# Patient Record
Sex: Female | Born: 1950 | Race: Black or African American | Hispanic: No | Smoking: Former smoker
Health system: Southern US, Community
[De-identification: ages and names within clinical notes are randomized; demographics above are authoritative.]

## PROBLEM LIST (undated history)

## (undated) DIAGNOSIS — E119 Type 2 diabetes mellitus without complications: Secondary | ICD-10-CM

## (undated) DIAGNOSIS — G20A1 Parkinson's disease without dyskinesia, without mention of fluctuations: Secondary | ICD-10-CM

## (undated) DIAGNOSIS — I10 Essential (primary) hypertension: Secondary | ICD-10-CM

## (undated) DIAGNOSIS — F1910 Other psychoactive substance abuse, uncomplicated: Secondary | ICD-10-CM

## (undated) HISTORY — PX: EYE SURGERY: SHX253

## (undated) HISTORY — DX: Other psychoactive substance abuse, uncomplicated: F19.10

## (undated) HISTORY — DX: Type 2 diabetes mellitus without complications: E11.9

---

## 1999-07-02 ENCOUNTER — Encounter: Admission: RE | Admit: 1999-07-02 | Discharge: 1999-07-02 | Payer: Self-pay | Admitting: Obstetrics and Gynecology

## 1999-07-02 ENCOUNTER — Encounter: Payer: Self-pay | Admitting: Obstetrics and Gynecology

## 1999-09-03 ENCOUNTER — Other Ambulatory Visit: Admission: RE | Admit: 1999-09-03 | Discharge: 1999-09-03 | Payer: Self-pay | Admitting: Obstetrics and Gynecology

## 2000-10-19 ENCOUNTER — Other Ambulatory Visit: Admission: RE | Admit: 2000-10-19 | Discharge: 2000-10-19 | Payer: Self-pay | Admitting: Obstetrics and Gynecology

## 2000-10-28 ENCOUNTER — Encounter: Admission: RE | Admit: 2000-10-28 | Discharge: 2000-10-28 | Payer: Self-pay | Admitting: Obstetrics and Gynecology

## 2000-10-28 ENCOUNTER — Encounter: Payer: Self-pay | Admitting: Obstetrics and Gynecology

## 2000-11-24 ENCOUNTER — Ambulatory Visit (HOSPITAL_COMMUNITY): Admission: RE | Admit: 2000-11-24 | Discharge: 2000-11-24 | Payer: Self-pay | Admitting: Gastroenterology

## 2003-10-04 ENCOUNTER — Inpatient Hospital Stay (HOSPITAL_COMMUNITY): Admission: EM | Admit: 2003-10-04 | Discharge: 2003-10-08 | Payer: Self-pay | Admitting: Emergency Medicine

## 2004-07-08 ENCOUNTER — Encounter: Admission: RE | Admit: 2004-07-08 | Discharge: 2004-07-08 | Payer: Self-pay | Admitting: Internal Medicine

## 2004-09-29 ENCOUNTER — Encounter: Admission: RE | Admit: 2004-09-29 | Discharge: 2004-09-29 | Payer: Self-pay | Admitting: Internal Medicine

## 2005-02-19 ENCOUNTER — Encounter: Admission: RE | Admit: 2005-02-19 | Discharge: 2005-02-19 | Payer: Self-pay | Admitting: Orthopedic Surgery

## 2005-03-04 ENCOUNTER — Encounter: Admission: RE | Admit: 2005-03-04 | Discharge: 2005-03-04 | Payer: Self-pay | Admitting: Orthopedic Surgery

## 2005-08-17 ENCOUNTER — Encounter: Admission: RE | Admit: 2005-08-17 | Discharge: 2005-08-17 | Payer: Self-pay | Admitting: Internal Medicine

## 2006-08-19 ENCOUNTER — Encounter: Admission: RE | Admit: 2006-08-19 | Discharge: 2006-08-19 | Payer: Self-pay | Admitting: Internal Medicine

## 2007-05-02 ENCOUNTER — Emergency Department (HOSPITAL_COMMUNITY): Admission: EM | Admit: 2007-05-02 | Discharge: 2007-05-03 | Payer: Self-pay | Admitting: Emergency Medicine

## 2007-09-11 ENCOUNTER — Encounter: Admission: RE | Admit: 2007-09-11 | Discharge: 2007-09-11 | Payer: Self-pay | Admitting: Internal Medicine

## 2007-09-20 ENCOUNTER — Emergency Department (HOSPITAL_COMMUNITY): Admission: EM | Admit: 2007-09-20 | Discharge: 2007-09-20 | Payer: Self-pay | Admitting: Family Medicine

## 2007-09-25 ENCOUNTER — Emergency Department (HOSPITAL_COMMUNITY): Admission: EM | Admit: 2007-09-25 | Discharge: 2007-09-25 | Payer: Self-pay | Admitting: Emergency Medicine

## 2007-09-26 ENCOUNTER — Ambulatory Visit (HOSPITAL_COMMUNITY): Admission: RE | Admit: 2007-09-26 | Discharge: 2007-09-26 | Payer: Self-pay | Admitting: Emergency Medicine

## 2007-09-26 ENCOUNTER — Encounter (INDEPENDENT_AMBULATORY_CARE_PROVIDER_SITE_OTHER): Payer: Self-pay | Admitting: Emergency Medicine

## 2007-09-26 ENCOUNTER — Ambulatory Visit: Payer: Self-pay | Admitting: Surgery

## 2008-09-20 ENCOUNTER — Encounter: Admission: RE | Admit: 2008-09-20 | Discharge: 2008-09-20 | Payer: Self-pay | Admitting: Internal Medicine

## 2009-01-14 ENCOUNTER — Emergency Department (HOSPITAL_COMMUNITY): Admission: EM | Admit: 2009-01-14 | Discharge: 2009-01-14 | Payer: Self-pay | Admitting: Family Medicine

## 2009-01-16 ENCOUNTER — Observation Stay (HOSPITAL_COMMUNITY): Admission: EM | Admit: 2009-01-16 | Discharge: 2009-01-18 | Payer: Self-pay | Admitting: Emergency Medicine

## 2009-09-02 ENCOUNTER — Ambulatory Visit (HOSPITAL_COMMUNITY): Admission: RE | Admit: 2009-09-02 | Discharge: 2009-09-02 | Payer: Self-pay | Admitting: Internal Medicine

## 2010-07-09 LAB — HEMOGLOBIN A1C
Hgb A1c MFr Bld: 7.1 % — ABNORMAL HIGH (ref 4.6–6.1)
Mean Plasma Glucose: 157 mg/dL

## 2010-07-09 LAB — CBC
HCT: 29.1 % — ABNORMAL LOW (ref 36.0–46.0)
HCT: 32.2 % — ABNORMAL LOW (ref 36.0–46.0)
Hemoglobin: 9.9 g/dL — ABNORMAL LOW (ref 12.0–15.0)
Hemoglobin: 10.9 g/dL — ABNORMAL LOW (ref 12.0–15.0)
MCHC: 33.8 g/dL (ref 30.0–36.0)
MCV: 85.6 fL (ref 78.0–100.0)
MCV: 85.7 fL (ref 78.0–100.0)
Platelets: 284 10*3/uL (ref 150–400)
Platelets: 285 K/uL (ref 150–400)
RBC: 3.4 MIL/uL — ABNORMAL LOW (ref 3.87–5.11)
RBC: 3.76 MIL/uL — ABNORMAL LOW (ref 3.87–5.11)
RDW: 16.2 % — ABNORMAL HIGH (ref 11.5–15.5)
RDW: 16.5 % — ABNORMAL HIGH (ref 11.5–15.5)
WBC: 11 10*3/uL — ABNORMAL HIGH (ref 4.0–10.5)

## 2010-07-09 LAB — BASIC METABOLIC PANEL
BUN: 32 mg/dL — ABNORMAL HIGH (ref 6–23)
Calcium: 9.4 mg/dL (ref 8.4–10.5)
Chloride: 104 mEq/L (ref 96–112)
Creatinine, Ser: 0.99 mg/dL (ref 0.4–1.2)
Creatinine, Ser: 1.64 mg/dL — ABNORMAL HIGH (ref 0.4–1.2)
GFR calc non Af Amer: 58 mL/min — ABNORMAL LOW (ref >60)
GFR calc non Af Amer: 32 mL/min — ABNORMAL LOW (ref >60)
Glucose, Bld: 140 mg/dL — ABNORMAL HIGH (ref 70–99)
Potassium: 4.5 mEq/L (ref 3.5–5.1)
Sodium: 135 mEq/L (ref 135–145)
Sodium: 137 mEq/L (ref 135–145)

## 2010-07-09 LAB — URINALYSIS, ROUTINE W REFLEX MICROSCOPIC
Bilirubin Urine: NEGATIVE
Glucose, UA: NEGATIVE mg/dL
Hgb urine dipstick: NEGATIVE
Ketones, ur: NEGATIVE mg/dL
Nitrite: NEGATIVE
Protein, ur: NEGATIVE mg/dL
Specific Gravity, Urine: 1.014 (ref 1.005–1.030)
Urobilinogen, UA: 0.2 mg/dL (ref 0.0–1.0)
pH: 5.5 (ref 5.0–8.0)

## 2010-07-09 LAB — URINE MICROSCOPIC-ADD ON

## 2010-07-09 LAB — COMPREHENSIVE METABOLIC PANEL
AST: 12 U/L (ref 0–37)
BUN: 20 mg/dL (ref 6–23)
CO2: 24 mEq/L (ref 19–32)
Calcium: 9.1 mg/dL (ref 8.4–10.5)
Chloride: 109 mEq/L (ref 96–112)
Creatinine, Ser: 1.16 mg/dL (ref 0.4–1.2)
GFR calc Af Amer: 58 mL/min — ABNORMAL LOW (ref >60)
GFR calc non Af Amer: 48 mL/min — ABNORMAL LOW (ref >60)
Total Bilirubin: 0.2 mg/dL — ABNORMAL LOW (ref 0.3–1.2)

## 2010-07-09 LAB — LIPID PANEL
Cholesterol: 123 mg/dL (ref 0–200)
LDL Cholesterol: 51 mg/dL (ref 0–99)

## 2010-07-09 LAB — GLUCOSE, CAPILLARY
Glucose-Capillary: 108 mg/dL — ABNORMAL HIGH (ref 70–99)
Glucose-Capillary: 192 mg/dL — ABNORMAL HIGH (ref 70–99)
Glucose-Capillary: 244 mg/dL — ABNORMAL HIGH (ref 70–99)
Glucose-Capillary: 83 mg/dL (ref 70–99)

## 2010-07-09 LAB — DIFFERENTIAL
Basophils Absolute: 0 10*3/uL (ref 0.0–0.1)
Basophils Relative: 0 % (ref 0–1)
Eosinophils Absolute: 0.1 K/uL (ref 0.0–0.7)
Eosinophils Relative: 1 % (ref 0–5)
Lymphocytes Relative: 14 % (ref 12–46)
Lymphs Abs: 1.5 K/uL (ref 0.7–4.0)
Monocytes Absolute: 1 10*3/uL (ref 0.1–1.0)
Monocytes Relative: 9 % (ref 3–12)
Neutro Abs: 8.4 10*3/uL — ABNORMAL HIGH (ref 1.7–7.7)
Neutrophils Relative %: 77 % (ref 43–77)

## 2010-07-09 LAB — BASIC METABOLIC PANEL WITH GFR
CO2: 24 meq/L (ref 19–32)
GFR calc Af Amer: 39 mL/min — ABNORMAL LOW (ref >60)
Potassium: 4 meq/L (ref 3.5–5.1)

## 2010-07-09 LAB — CULTURE, BLOOD (ROUTINE X 2)
Culture: NO GROWTH
Culture: NO GROWTH

## 2010-07-11 ENCOUNTER — Inpatient Hospital Stay (INDEPENDENT_AMBULATORY_CARE_PROVIDER_SITE_OTHER)
Admission: RE | Admit: 2010-07-11 | Discharge: 2010-07-11 | Disposition: A | Discharge status: Home / Self Care | Payer: Self-pay | Source: Ambulatory Visit | Attending: Emergency Medicine | Admitting: Emergency Medicine

## 2010-07-11 DIAGNOSIS — R55 Syncope and collapse: Secondary | ICD-10-CM

## 2010-07-11 DIAGNOSIS — R112 Nausea with vomiting, unspecified: Secondary | ICD-10-CM

## 2010-07-11 LAB — POCT URINALYSIS DIP (DEVICE)
Bilirubin Urine: NEGATIVE
Glucose, UA: NEGATIVE mg/dL
Ketones, ur: NEGATIVE mg/dL
Nitrite: NEGATIVE
pH: 6.5 (ref 5.0–8.0)

## 2010-08-21 NOTE — Procedures (Signed)
[place]  Patient:    April Larson, April Larson Visit Number: [id number] MRN: [id number]          Service Type: END Location: ENDO Attending Physician:  Orland Mustard Proc. Date: 11/24/00 Adm. Date:  11/24/2000   CC:         Janae Bridgeman. Eloise Harman., M.D.   Procedure Report  PROCEDURE:  Colonoscopy.  MEDICATIONS:  Fentanyl 60 mcg, Versed 6 mg IV.  INDICATION:  Previous history of adenomatous colon polyps.  SCOPE:  Adult Olympus video colonoscope.  DESCRIPTION OF PROCEDURE:  The procedure had been explained to the patient and consent obtained.  With the patient in the left lateral decubitus position, the Olympus adult video colonoscope was inserted and advanced under direct visualization.  The prep was excellent, and we were able to reach the cecum using abdominal pressure and position changes.  The cecum was reached, ileocecal valve was seen, and the appendiceal orifice was identified.  The scope was withdrawn, and the cecum, ascending colon, hepatic flexure, transverse colon, splenic flexure, descending, and sigmoid colon were seen well upon removal.  No polyps were seen throughout.  There was no significant diverticular disease.  The scope was withdrawn.  The patient tolerated the procedure well, was maintained on low-flow oxygen and pulse oximeter throughout the procedure.  ASSESSMENT:  No evidence of further colonic polyps.  PLAN:  Will plan on repeating the procedure in five years. Attending Physician:  Orland Mustard DD:  11/24/00 TD:  11/25/00 Job: [id number]

## 2010-08-21 NOTE — Discharge Summary (Signed)
NAME:  April Larson, April Larson                       ACCOUNT NO.:  [id number]   MEDICAL RECORD NO.:  [id number]                   PATIENT TYPE:  INP   LOCATION:  6703                                 FACILITY:  [facility]   PHYSICIAN:  Robyn N. Allyne Gee, M.D.              DATE OF BIRTH:  January 17, 1951   DATE OF ADMISSION:  10/04/2003  DATE OF DISCHARGE:  10/08/2003                                 DISCHARGE SUMMARY   DISCHARGE DIAGNOSES:  1. Cellulitis.  2. Hypertension.  3. Diabetes.  4. Hypercholesterolemia.   Ms. [name] is a 60 year old female with a past medical history significant  for type 2 diabetes, hypertension and hyperlipidemia who was admitted for  treatment of left ankle and leg pain and swelling.  She states that she went  to bed the night prior to her admission with leg swelling, however, awakened  at about 11:30 p.m. with left leg swelling and red splotches.  She was found  to have a temperature of 100 at home, however, it had risen to 103.0 in the  ER.  Neither antibiotics nor Tylenol were initially given to the patient,  however, upon examination it was determined that the patient needed to be  admitted for IV antibiotics for treatment of cellulitis.   Her hospitalization for the cellulitis was rather uneventful.  She was  started on IV Unasyn and blood cultures were checked which both came back  negative or without growth.  A left lower extremity Doppler was performed to  rule out lower extremity DVT which was negative.  Her initial white count  was 21,000 and gradually decreased over the next several days to 16.9 and  then 10.8.  The patient complained about left bone pain that occurred while  placing her foot on the floor.  Left lower extremity x-ray was done to rule  out osteomyelitis which was negative.  She was placed on Lovenox daily for  DVT prophylaxis.  The patient's pain was resolved with a K pad and oral pain  medication.  She was then discharged in stable condition  on October 08, 2003.  She was discharged home on Augmentin XR 1000 mg p.o. b.i.d. and Ultracet one  tablet p.o. q.6h. p.r.n. pain.  The patient __________ narcotics for pain  because of her history of narcotic abuse.  The importance of strict blood  glucose control was stressed to the patient to help her infection heal a lot  quicker.   She was to follow up with Dr. Allyne Gee in one week and an appointment was  scheduled for her to see a podiatrist as an outpatient.  She was advised to  call the office if she experienced increased pain or swelling.   Again, she was discharged on Augmentin and Ultracet as stated above plus all  of her home medications which includes:  1. Lipitor 10 mg p.o. q.h.s.  2. Amaryl 4 mg p.o. daily.  3. Hyzaar 50/12.5  mg one tablet daily.  4. Avandia 2 mg p.o. b.i.d.  5. Metformin 500 mg p.o. b.i.d.   Her symptoms were stable and improved.                                                Robyn N. Allyne Gee, M.D.    RNS/MEDQ  D:  11/25/2003  T:  11/26/2003  Job:  [id number]

## 2010-12-31 LAB — POCT I-STAT, CHEM 8
BUN: 17
Creatinine, Ser: 0.8
Hemoglobin: 12.6
Potassium: 3.9
Sodium: 138
TCO2: 28

## 2010-12-31 LAB — DIFFERENTIAL
Basophils Absolute: 0
Eosinophils Absolute: 0.2
Eosinophils Relative: 3
Lymphocytes Relative: 26
Lymphs Abs: 2.4
Neutrophils Relative %: 65

## 2010-12-31 LAB — INFLUENZA A AND B ANTIGEN (CONVERTED LAB): Influenza B Ag: NEGATIVE

## 2010-12-31 LAB — CBC
HCT: 34.6 — ABNORMAL LOW
Platelets: 485 — ABNORMAL HIGH
RDW: 15.5
WBC: 9.2

## 2012-07-13 ENCOUNTER — Other Ambulatory Visit (HOSPITAL_COMMUNITY): Payer: Self-pay | Admitting: Internal Medicine

## 2012-07-13 DIAGNOSIS — Z1231 Encounter for screening mammogram for malignant neoplasm of breast: Secondary | ICD-10-CM

## 2012-07-14 ENCOUNTER — Ambulatory Visit (HOSPITAL_COMMUNITY)
Admission: RE | Admit: 2012-07-14 | Discharge: 2012-07-14 | Disposition: A | Discharge status: Home / Self Care | Payer: BC Managed Care – PPO | Source: Ambulatory Visit | Attending: Internal Medicine | Admitting: Internal Medicine

## 2012-07-14 DIAGNOSIS — Z1231 Encounter for screening mammogram for malignant neoplasm of breast: Secondary | ICD-10-CM | POA: Insufficient documentation

## 2012-08-04 ENCOUNTER — Ambulatory Visit (INDEPENDENT_AMBULATORY_CARE_PROVIDER_SITE_OTHER): Payer: BC Managed Care – PPO | Admitting: Emergency Medicine

## 2012-08-04 VITALS — BP 126/74 | HR 96 | Temp 98.0°F | Resp 18 | Ht 64.0 in | Wt 171.6 lb

## 2012-08-04 DIAGNOSIS — IMO0002 Reserved for concepts with insufficient information to code with codable children: Secondary | ICD-10-CM

## 2012-08-04 DIAGNOSIS — L02512 Cutaneous abscess of left hand: Secondary | ICD-10-CM

## 2012-08-04 MED ORDER — SULFAMETHOXAZOLE-TRIMETHOPRIM 800-160 MG PO TABS: 1.0000 | ORAL_TABLET | Freq: Two times a day (BID) | ORAL | Status: DC

## 2012-08-04 NOTE — Progress Notes (Signed)
[facility] [address] [place] [address] [phone]  Date:  08/04/2012   Name:  April Larson   DOB:  August 01, 1950   MRN:  [id number]  PCP:  [name], MD    Chief Complaint: Joint Swelling   History of Present Illness:  April Larson is a 62 y.o. very pleasant female patient who presents with the following:  Tuesday morning noticed a swelling in the tip of her left third finger.  Now has large pustule under tip of nail swelling the tuft.  No lymphadenitis.  No other complaints.  No fever or chills or history of injury  There are no active problems to display for this patient.   Past Medical History  Diagnosis Date  . Substance abuse   . Diabetes mellitus without complication     History reviewed. No pertinent past surgical history.  History  Substance Use Topics  . Smoking status: Never Smoker   . Smokeless tobacco: Not on file  . Alcohol Use: No    Family History  Problem Relation Age of Onset  . Cancer Mother   . Diabetes Father   . Cancer Brother     No Known Allergies  Medication list has been reviewed and updated.  No current outpatient prescriptions on file prior to visit.   No current facility-administered medications on file prior to visit.    Review of Systems:  As per HPI, otherwise negative.    Physical Examination: Filed Vitals:   08/04/12 1420  BP: 126/74  Pulse: 96  Temp: 98 F (36.7 C)  Resp: 18   Filed Vitals:   08/04/12 1420  Height: 5\' 4"  (1.626 m)  Weight: 171 lb 9.6 oz (77.837 kg)   Body mass index is 29.44 kg/(m^2). Ideal Body Weight: Weight in (lb) to have BMI = 25: 145.3   GEN: WDWN, NAD, Non-toxic, Alert & Oriented x 3 HEENT: Atraumatic, Normocephalic.  Ears and Nose: No external deformity. EXTR: No clubbing/cyanosis/edema NEURO: Normal gait.  PSYCH: Normally interactive. Conversant. Not depressed or anxious appearing.  Calm demeanor.  LEFT HAND:   Felon third finger  Assessment and Plan: FELON Needle aspiration Septra Warm soaks Expect to lose the nail   Signed,  Phillips Odor, MD

## 2012-08-04 NOTE — Patient Instructions (Addendum)
Paronychia  Paronychia is an inflammatory reaction involving the folds of the skin surrounding the fingernail. This is commonly caused by an infection in the skin around a nail. The most common cause of paronychia is frequent wetting of the hands (as seen with bartenders, food servers, nurses or others who wet their hands). This makes the skin around the fingernail susceptible to infection by bacteria (germs) or fungus. Other predisposing factors are:   Aggressive manicuring.   Nail biting.   Thumb sucking.  The most common cause is a staphylococcal (a type of germ) infection, or a fungal (Candida) infection. When caused by a germ, it usually comes on suddenly with redness, swelling, pus and is often painful. It may get under the nail and form an abscess (collection of pus), or form an abscess around the nail. If the nail itself is infected with a fungus, the treatment is usually prolonged and may require oral medicine for up to one year. Your caregiver will determine the length of time treatment is required. The paronychia caused by bacteria (germs) may largely be avoided by not pulling on hangnails or picking at cuticles. When the infection occurs at the tips of the finger it is called felon. When the cause of paronychia is from the herpes simplex virus (HSV) it is called herpetic whitlow.  TREATMENT   When an abscess is present treatment is often incision and drainage. This means that the abscess must be cut open so the pus can get out. When this is done, the following home care instructions should be followed.  HOME CARE INSTRUCTIONS    It is important to keep the affected fingers very dry. Rubber or plastic gloves over cotton gloves should be used whenever the hand must be placed in water.   Keep wound clean, dry and dressed as suggested by your caregiver between warm soaks or warm compresses.   Soak in warm water for fifteen to twenty minutes three to four times per day for bacterial infections. Fungal  infections are very difficult to treat, so often require treatment for long periods of time.   For bacterial (germ) infections take antibiotics (medicine which kill germs) as directed and finish the prescription, even if the problem appears to be solved before the medicine is gone.   Only take over-the-counter or prescription medicines for pain, discomfort, or fever as directed by your caregiver.  SEEK IMMEDIATE MEDICAL CARE IF:   You have redness, swelling, or increasing pain in the wound.   You notice pus coming from the wound.   You have a fever.   You notice a bad smell coming from the wound or dressing.  Document Released: 09/15/2000 Document Revised: 06/14/2011 Document Reviewed: 05/17/2008  ExitCare Patient Information 2013 [employer].

## 2013-06-28 ENCOUNTER — Other Ambulatory Visit (HOSPITAL_COMMUNITY): Payer: Self-pay | Admitting: Internal Medicine

## 2013-06-28 DIAGNOSIS — Z1231 Encounter for screening mammogram for malignant neoplasm of breast: Secondary | ICD-10-CM

## 2013-07-16 ENCOUNTER — Ambulatory Visit (HOSPITAL_COMMUNITY)
Admission: RE | Admit: 2013-07-16 | Discharge: 2013-07-16 | Disposition: A | Discharge status: Home / Self Care | Payer: No Typology Code available for payment source | Source: Ambulatory Visit | Attending: Internal Medicine | Admitting: Internal Medicine

## 2013-07-16 DIAGNOSIS — Z1231 Encounter for screening mammogram for malignant neoplasm of breast: Secondary | ICD-10-CM | POA: Insufficient documentation

## 2014-06-26 ENCOUNTER — Ambulatory Visit (INDEPENDENT_AMBULATORY_CARE_PROVIDER_SITE_OTHER): Payer: 59 | Admitting: Emergency Medicine

## 2014-06-26 ENCOUNTER — Ambulatory Visit (INDEPENDENT_AMBULATORY_CARE_PROVIDER_SITE_OTHER): Payer: 59

## 2014-06-26 VITALS — BP 132/74 | HR 80 | Temp 97.9°F | Resp 20 | Ht 64.0 in | Wt 184.5 lb

## 2014-06-26 DIAGNOSIS — M25571 Pain in right ankle and joints of right foot: Secondary | ICD-10-CM | POA: Diagnosis not present

## 2014-06-26 DIAGNOSIS — S8261XA Displaced fracture of lateral malleolus of right fibula, initial encounter for closed fracture: Secondary | ICD-10-CM | POA: Diagnosis not present

## 2014-06-26 MED ORDER — HYDROCODONE-ACETAMINOPHEN 5-325 MG PO TABS: 1.0000 | ORAL_TABLET | ORAL | Status: DC | PRN

## 2014-06-26 NOTE — Patient Instructions (Signed)
Ankle Fracture  A fracture is a break in a bone. The ankle joint is made up of three bones. These include the lower (distal)sections of your lower leg bones, called the tibia and fibula, along with a bone in your foot, called the talus. Depending on how bad the break is and if more than one ankle joint bone is broken, a cast or splint is used to protect and keep your injured bone from moving while it heals. Sometimes, surgery is required to help the fracture heal properly.   There are two general types of fractures:   Stable fracture. This includes a single fracture line through one bone, with no injury to ankle ligaments. A fracture of the talus that does not have any displacement (movement of the bone on either side of the fracture line) is also stable.   Unstable fracture. This includes more than one fracture line through one or more bones in the ankle joint. It also includes fractures that have displacement of the bone on either side of the fracture line.  CAUSES   A direct blow to the ankle.    Quickly and severely twisting your ankle.   Trauma, such as a car accident or falling from a significant height.  RISK FACTORS  You may be at a higher risk of ankle fracture if:   You have certain medical conditions.   You are involved in high-impact sports.   You are involved in a high-impact car accident.  SIGNS AND SYMPTOMS    Tender and swollen ankle.   Bruising around the injured ankle.   Pain on movement of the ankle.   Difficulty walking or putting weight on the ankle.   A cold foot below the site of the ankle injury. This can occur if the blood vessels passing through your injured ankle were also damaged.   Numbness in the foot below the site of the ankle injury.  DIAGNOSIS   An ankle fracture is usually diagnosed with a physical exam and X-rays. A CT scan may also be required for complex fractures.  TREATMENT   Stable fractures are treated with a cast or splint and using crutches to avoid putting  weight on your injured ankle. This is followed by an ankle strengthening program. Some patients require a special type of cast, depending on other medical problems they may have. Unstable fractures require surgery to ensure the bones heal properly. Your health care provider will tell you what type of fracture you have and the best treatment for your condition.  HOME CARE INSTRUCTIONS    Review correct crutch use with your health care provider and use your crutches as directed. Safe use of crutches is extremely important. Misuse of crutches can cause you to fall or cause injury to nerves in your hands or armpits.   Do not put weight or pressure on the injured ankle until directed by your health care provider.   To lessen the swelling, keep the injured leg elevated while sitting or lying down.   Apply ice to the injured area:   Put ice in a plastic bag.   Place a towel between your cast and the bag.   Leave the ice on for 20 minutes, 2-3 times a day.   If you have a plaster or fiberglass cast:   Do not try to scratch the skin under the cast with any objects. This can increase your risk of skin infection.   Check the skin around the cast every day. You   may put lotion on any red or sore areas.   Keep your cast dry and clean.   If you have a plaster splint:   Wear the splint as directed.   You may loosen the elastic around the splint if your toes become numb, tingle, or turn cold or blue.   Do not put pressure on any part of your cast or splint; it may break. Rest your cast only on a pillow the first 24 hours until it is fully hardened.   Your cast or splint can be protected during bathing with a plastic bag sealed to your skin with medical tape. Do not lower the cast or splint into water.   Take medicines as directed by your health care provider. Only take over-the-counter or prescription medicines for pain, discomfort, or fever as directed by your health care provider.   Do not drive a vehicle until  your health care provider specifically tells you it is safe to do so.   If your health care provider has given you a follow-up appointment, it is very important to keep that appointment. Not keeping the appointment could result in a chronic or permanent injury, pain, and disability. If you have any problem keeping the appointment, call the facility for assistance.  SEEK MEDICAL CARE IF:  You develop increased swelling or discomfort.  SEEK IMMEDIATE MEDICAL CARE IF:    Your cast gets damaged or breaks.   You have continued severe pain.   You develop new pain or swelling after the cast was put on.   Your skin or toenails below the injury turn blue or gray.   Your skin or toenails below the injury feel cold, numb, or have loss of sensitivity to touch.   There is a bad smell or pus draining from under the cast.  MAKE SURE YOU:    Understand these instructions.   Will watch your condition.   Will get help right away if you are not doing well or get worse.  Document Released: 03/19/2000 Document Revised: 03/27/2013 Document Reviewed: 10/19/2012  ExitCare Patient Information 2015 [employer]. This information is not intended to replace advice given to you by your health care provider. Make sure you discuss any questions you have with your health care provider.

## 2014-06-26 NOTE — Progress Notes (Signed)
[facility] [address] [place] [address] [phone]  Date:  06/26/2014   Name:  April Larson   DOB:  1950-07-17   MRN:  [id number]  PCP:  Maximino Greenland, MD    Chief Complaint: Ankle Injury   History of Present Illness:  April Larson is a 64 y.o. very pleasant female patient who presents with the following:  Missed bottom step and fell, twisting right ankle No deformity Tender and painful right lateral malleolus. Pain with ambulation.  Less with elevation Denies other injury. No improvement with over the counter medications or other home remedies.  Denies other complaint or health concern today.   There are no active problems to display for this patient.   Past Medical History  Diagnosis Date  . Substance abuse   . Diabetes mellitus without complication     History reviewed. No pertinent past surgical history.  History  Substance Use Topics  . Smoking status: Never Smoker   . Smokeless tobacco: Not on file  . Alcohol Use: No    Family History  Problem Relation Age of Onset  . Cancer Mother   . Diabetes Father   . Cancer Brother     No Known Allergies  Medication list has been reviewed and updated.  Current Outpatient Prescriptions on File Prior to Visit  Medication Sig Dispense Refill  . cholecalciferol (VITAMIN D) 400 UNITS TABS Take by mouth.    . insulin detemir (LEVEMIR) 100 UNIT/ML injection Inject 45 Units into the skin at bedtime.     . Liraglutide (VICTOZA) 18 MG/3ML SOPN Inject 1.8 Units into the skin.     Marland Kitchen loratadine (CLARITIN) 10 MG tablet Take 10 mg by mouth daily.    . metFORMIN (GLUCOPHAGE) 500 MG tablet Take 500 mg by mouth 2 (two) times daily with a meal.      No current facility-administered medications on file prior to visit.    Review of Systems:  As per HPI, otherwise negative.    Physical Examination: Filed Vitals:   06/26/14 1910  BP: 132/74  Pulse: 80   Temp: 97.9 F (36.6 C)  Resp: 20   Filed Vitals:   06/26/14 1910  Height: 5\' 4"  (1.626 m)  Weight: 184 lb 8 oz (83.689 kg)   Body mass index is 31.65 kg/(m^2). Ideal Body Weight: Weight in (lb) to have BMI = 25: 145.3   GEN: WDWN, NAD, Non-toxic, Alert & Oriented x 3 HEENT: Atraumatic, Normocephalic.  Ears and Nose: No external deformity. EXTR: No clubbing/cyanosis/edema NEURO: Normal gait.  PSYCH: Normally interactive. Conversant. Not depressed or anxious appearing.  Calm demeanor.  Tender lateral malleolus and lateral ankle.  No deformity.  Tender dorsal right foot.  Some swelling and guards   Assessment and Plan: Fracture distal fibula Boot Ortho  Signed,  Ellison Carwin, MD   [facility] reading (PRIMARY) by  Dr. Ouida Sills.  Flake fracture distal fibula.

## 2014-06-27 ENCOUNTER — Telehealth: Payer: Self-pay

## 2014-06-27 NOTE — Telephone Encounter (Signed)
What is a cb

## 2014-06-27 NOTE — Telephone Encounter (Signed)
She was seen here last night on 3/23 and received a boot. She would like a CB asap, so she can get a handicap sticker for her car before 5 today. I told her this was probably not going to happen today. Please advise at [phone]

## 2014-06-27 NOTE — Telephone Encounter (Signed)
Dr. Ouida Sills, please advise. Thanks

## 2014-06-29 NOTE — Telephone Encounter (Signed)
Callback

## 2014-07-23 ENCOUNTER — Other Ambulatory Visit (HOSPITAL_COMMUNITY): Payer: Self-pay | Admitting: Internal Medicine

## 2014-07-23 DIAGNOSIS — Z1231 Encounter for screening mammogram for malignant neoplasm of breast: Secondary | ICD-10-CM

## 2014-07-24 ENCOUNTER — Ambulatory Visit (HOSPITAL_COMMUNITY)
Admission: RE | Admit: 2014-07-24 | Discharge: 2014-07-24 | Disposition: A | Discharge status: Home / Self Care | Payer: 59 | Source: Ambulatory Visit | Attending: Internal Medicine | Admitting: Internal Medicine

## 2014-07-24 DIAGNOSIS — Z1231 Encounter for screening mammogram for malignant neoplasm of breast: Secondary | ICD-10-CM

## 2015-07-07 ENCOUNTER — Other Ambulatory Visit: Payer: Self-pay

## 2015-07-07 DIAGNOSIS — Z1231 Encounter for screening mammogram for malignant neoplasm of breast: Secondary | ICD-10-CM

## 2015-07-25 ENCOUNTER — Ambulatory Visit: Payer: No Typology Code available for payment source

## 2015-08-19 ENCOUNTER — Ambulatory Visit
Admission: RE | Admit: 2015-08-19 | Discharge: 2015-08-19 | Disposition: A | Discharge status: Home / Self Care | Payer: BLUE CROSS/BLUE SHIELD | Source: Ambulatory Visit

## 2015-08-19 DIAGNOSIS — Z1231 Encounter for screening mammogram for malignant neoplasm of breast: Secondary | ICD-10-CM

## 2015-09-18 ENCOUNTER — Encounter (HOSPITAL_COMMUNITY): Payer: Self-pay | Admitting: Emergency Medicine

## 2015-09-18 ENCOUNTER — Emergency Department (HOSPITAL_COMMUNITY)
Admission: EM | Admit: 2015-09-18 | Discharge: 2015-09-18 | Disposition: A | Discharge status: Home / Self Care | Payer: BLUE CROSS/BLUE SHIELD | Attending: Emergency Medicine | Admitting: Emergency Medicine

## 2015-09-18 DIAGNOSIS — Y929 Unspecified place or not applicable: Secondary | ICD-10-CM | POA: Diagnosis not present

## 2015-09-18 DIAGNOSIS — S0181XA Laceration without foreign body of other part of head, initial encounter: Secondary | ICD-10-CM | POA: Diagnosis present

## 2015-09-18 DIAGNOSIS — Y999 Unspecified external cause status: Secondary | ICD-10-CM | POA: Diagnosis not present

## 2015-09-18 DIAGNOSIS — Y939 Activity, unspecified: Secondary | ICD-10-CM | POA: Diagnosis not present

## 2015-09-18 DIAGNOSIS — S0191XA Laceration without foreign body of unspecified part of head, initial encounter: Secondary | ICD-10-CM

## 2015-09-18 DIAGNOSIS — Z7984 Long term (current) use of oral hypoglycemic drugs: Secondary | ICD-10-CM | POA: Diagnosis not present

## 2015-09-18 DIAGNOSIS — W2209XA Striking against other stationary object, initial encounter: Secondary | ICD-10-CM | POA: Diagnosis not present

## 2015-09-18 DIAGNOSIS — E119 Type 2 diabetes mellitus without complications: Secondary | ICD-10-CM | POA: Insufficient documentation

## 2015-09-18 DIAGNOSIS — Z794 Long term (current) use of insulin: Secondary | ICD-10-CM | POA: Diagnosis not present

## 2015-09-18 MED ORDER — LIDOCAINE HCL (PF) 1 % IJ SOLN
5.0000 mL | Freq: Once | INTRAMUSCULAR | Status: AC
  Administered 2015-09-18: 5 mL
  Filled 2015-09-18: qty 5

## 2015-09-18 MED ORDER — TETANUS-DIPHTH-ACELL PERTUSSIS 5-2.5-18.5 LF-MCG/0.5 IM SUSP
0.5000 mL | Freq: Once | INTRAMUSCULAR | Status: AC
  Administered 2015-09-18: 0.5 mL via INTRAMUSCULAR
  Filled 2015-09-18: qty 0.5

## 2015-09-18 NOTE — ED Notes (Signed)
Pt stable, ambulatory, states understanding of discharge instructions 

## 2015-09-18 NOTE — Discharge Instructions (Signed)
Sutured Wound Care Sutures are stitches that can be used to close wounds. Taking care of your wound properly can help prevent pain and infection. It can also help your wound to heal more quickly. HOW TO CARE FOR YOUR SUTURED WOUND Wound Care  Keep the wound clean and dry.  If you were given a bandage (dressing), change it at least one time per day or as told by your doctor. You should also change it if it gets wet or dirty.  Keep the wound completely dry for the first 24 hours or as told by your doctor. After that time, you may shower or bathe. However, make sure that the wound is not soaked in water until the sutures have been removed.  Clean the wound one time each day or as told by your doctor.  Wash the wound with soap and water.  Rinse the wound with water to remove all soap.  Pat the wound dry with a clean towel. Do not rub the wound.  After cleaning the wound, put a thin layer of antibiotic ointment on it as told your doctor. This ointment:  Helps to prevent infection.  Keeps the bandage from sticking to the wound.  Have the sutures removed as told by your doctor. General Instructions  Take or apply medicines only as told by your doctor.  To help prevent scarring, make sure to cover your wound with sunscreen whenever you are outside after the sutures are removed and the wound is healed. Make sure to wear a sunscreen of at least 30 SPF.  If you were prescribed an antibiotic medicine or ointment, finish all of it even if you start to feel better.  Do not scratch or pick at the wound.  Keep all follow-up visits as told by your doctor. This is important.  Check your wound every day for signs of infection. Watch for:  Redness, swelling, or pain.  Fluid, blood, or pus.  Raise (elevate) the injured area above the level of your heart while you are sitting or lying down, if possible.  Avoid stretching your wound.  Drink enough fluids to keep your pee (urine) clear or  pale yellow. GET HELP IF:  You were given a tetanus shot and you have any of these where the needle went in:  Swelling.  Very bad pain.  Redness.  Bleeding.  You have a fever.  A wound that was closed breaks open.  You notice a bad smell coming from the wound.  You notice something coming out of the wound, such as wood or glass.  Medicine does not help your pain.  You have any of these at the site of the wound.  More redness.  More swelling.  More pain.  You have any of these coming from the wound.  Fluid.  Blood.  Pus.  You notice a change in the color of your skin near the wound.  You need to change the bandage often due to fluid, blood, or pus coming from the wound.  You have a new rash.  You have numbness around the wound. GET HELP RIGHT AWAY IF:  You have very bad swelling around the wound.  Your pain suddenly gets worse and is very bad.  You have painful lumps near the wound or on skin that is anywhere on your body.  You have a red streak going away from the wound.  The wound is on your hand or foot and you cannot move a finger or toe like normal.  The wound is on your hand or foot and you notice that your fingers or toes look pale or bluish.   This information is not intended to replace advice given to you by your health care provider. Make sure you discuss any questions you have with your health care provider.   Follow-up with your primary care doctor in 5 days for suture removal. Keep wound clean and dry. You may wash with antibacterial soap and water. Return to the emergency department if you experience redness or swelling around your wound, fevers, chills, loss of consciousness, blurry vision, vomiting.

## 2015-09-18 NOTE — ED Provider Notes (Signed)
CSN: [id number]     Arrival date & time 09/18/15  1613 History  By signing my name below, I, April Larson, attest that this documentation has been prepared under the direction and in the presence of non-physician practitioner, Donnald Garre, PA-C. Electronically Signed: Higinio Larson, Scribe. 09/18/2015. 5:56 PM.   Chief Complaint  Patient presents with  . Head Laceration   The history is provided by the patient. No language interpreter was used.   HPI Comments:  April Larson is a 65 y.o. female with PMHx of DM (sugar: 130-206), who presents to the Emergency Department complaining of gradually worsening, "throbbing," 8/10, pain from left-sided forehead laceration that occurred at ~2:30pm this afternoon. Pt states she accidentally walked into the garage railing as she was preparing to get into her car. She denies loss of consciousness or loss of memory at the time of injury. She states she cleaned the wound and applied neosporin; however, she began to feel oozing blood down her face. She notes her wound is no longer bleeding at this time. She states she called her PCP who advised her to come to the ED. She denies dizziness, vomiting, and blurry vision. She also denies the use of blood thinners and any recent EtOH consumption. She reports her last tetanus immunization was in 2006.   Past Medical History  Diagnosis Date  . Substance abuse   . Diabetes mellitus without complication ([place])    History reviewed. No pertinent past surgical history. Family History  Problem Relation Age of Onset  . Cancer Mother   . Diabetes Father   . Cancer Brother    Social History  Substance Use Topics  . Smoking status: Never Smoker   . Smokeless tobacco: None  . Alcohol Use: No   OB History    No data available     Review of Systems 10 systems reviewed and all are negative for acute change except as noted in the HPI.  Allergies  Review of patient's allergies indicates no known  allergies.  Home Medications   Prior to Admission medications   Medication Sig Start Date End Date Taking? Authorizing Provider  Biotin 10 MG CAPS Take 1 tablet by mouth daily.    Historical Provider, MD  cholecalciferol (VITAMIN D) 400 UNITS TABS Take by mouth.    Historical Provider, MD  HYDROcodone-acetaminophen (NORCO) 5-325 MG per tablet Take 1-2 tablets by mouth every 4 (four) hours as needed. 06/26/14   Roselee Culver, MD  insulin detemir (LEVEMIR) 100 UNIT/ML injection Inject 45 Units into the skin at bedtime.     Historical Provider, MD  Liraglutide (VICTOZA) 18 MG/3ML SOPN Inject 1.8 Units into the skin.     Historical Provider, MD  loratadine (CLARITIN) 10 MG tablet Take 10 mg by mouth daily.    Historical Provider, MD  losartan-hydrochlorothiazide (HYZAAR) 50-12.5 MG per tablet Take 1 tablet by mouth daily.    Historical Provider, MD  metFORMIN (GLUCOPHAGE) 500 MG tablet Take 500 mg by mouth 2 (two) times daily with a meal.     Historical Provider, MD  pioglitazone (ACTOS) 15 MG tablet Take 15 mg by mouth daily.    Historical Provider, MD  Pitavastatin Calcium 4 MG TABS Take 1 tablet by mouth daily.    Historical Provider, MD   Triage Vitals: BP 145/78 mmHg  Pulse 91  Temp(Src) 99.2 F (37.3 C) (Oral)  Resp 18  Ht 5\' 4"  (1.626 m)  Wt 178 lb (80.74 kg)  BMI 30.54  kg/m2  SpO2 100% Physical Exam  Constitutional: She is oriented to person, place, and time. She appears well-developed and well-nourished. No distress.  HENT:  Head: Normocephalic.  2cm laceration to left forehead  No foreign bodies seen or palpated No sign of depressed skull fracture No battle sign or racoon eyes  No hemotympanum   Eyes: Conjunctivae are normal. Right eye exhibits no discharge. Left eye exhibits no discharge. No scleral icterus.  Cardiovascular: Normal rate.   Pulmonary/Chest: Effort normal.  Neurological: She is alert and oriented to person, place, and time. Coordination normal.  Skin:  Skin is warm and dry. No rash noted. She is not diaphoretic. No erythema. No pallor.  Psychiatric: She has a normal mood and affect. Her behavior is normal.  Nursing note and vitals reviewed.   ED Course  Procedures  DIAGNOSTIC STUDIES:  Oxygen Saturation is 100% on RA, normal by my interpretation.    COORDINATION OF CARE:  5:26 PM Discussed treatment Larson with pt at bedside and pt agreed to Larson.  LACERATION REPAIR PROCEDURE NOTE The patient's identification was confirmed and consent was obtained. This procedure was performed by No att. providers found at 6:59 PM. Site: left forehead Sterile procedures observed: yes Anesthetic used (type and amt): 1% lidocaine w/out epi Suture type/size:5-0 ethilon Length:2 cm # of Sutures: 3 Technique:simple interrupted Complexity: simple Antibx ointment applied: yes Tetanus UTD or ordered: yes Site anesthetized, irrigated with NS, explored without evidence of foreign body, wound well approximated, site covered with dry, sterile dressing.  Patient tolerated procedure well without complications. Instructions for care discussed verbally and patient provided with additional written instructions for homecare and f/u.  Labs Review Labs Reviewed - No data to display  Imaging Review No results found. I have personally reviewed and evaluated these images and lab results as part of my medical decision-making.   EKG Interpretation None      MDM   Final diagnoses:  Laceration of head, initial encounter   Tetanus updated in ED. Laceration occurred < 12 hours prior to repair. Pt appears well in ED. GCS 15. No CT needed using [place] CT head guidelines. Discussed laceration care with pt and answered questions. Pt to f-u for suture removal in 5 days and wound check sooner should there be signs of dehiscence or infection. Pt is hemodynamically stable with no complaints prior to dc.    I personally performed the services described in this  documentation, which was scribed in my presence. The recorded information has been reviewed and is accurate.    April Larson [place], PA-C 09/18/15 2033  Julianne Rice, MD 09/18/15 [phone]

## 2015-09-18 NOTE — ED Notes (Signed)
Pt st's she walked into a piece of metal on garage door,  Pt has lac to left forehead,  No bleeding at this time.  Pt denies LOC

## 2016-04-05 ENCOUNTER — Encounter (HOSPITAL_COMMUNITY): Payer: Self-pay | Admitting: Emergency Medicine

## 2016-04-05 ENCOUNTER — Emergency Department (HOSPITAL_COMMUNITY): Payer: BLUE CROSS/BLUE SHIELD

## 2016-04-05 ENCOUNTER — Emergency Department (HOSPITAL_COMMUNITY)
Admission: EM | Admit: 2016-04-05 | Discharge: 2016-04-05 | Disposition: A | Discharge status: Home / Self Care | Payer: BLUE CROSS/BLUE SHIELD | Attending: Emergency Medicine | Admitting: Emergency Medicine

## 2016-04-05 DIAGNOSIS — S93601A Unspecified sprain of right foot, initial encounter: Secondary | ICD-10-CM

## 2016-04-05 DIAGNOSIS — Y9389 Activity, other specified: Secondary | ICD-10-CM | POA: Insufficient documentation

## 2016-04-05 DIAGNOSIS — Y999 Unspecified external cause status: Secondary | ICD-10-CM | POA: Insufficient documentation

## 2016-04-05 DIAGNOSIS — Z794 Long term (current) use of insulin: Secondary | ICD-10-CM | POA: Insufficient documentation

## 2016-04-05 DIAGNOSIS — Y929 Unspecified place or not applicable: Secondary | ICD-10-CM | POA: Insufficient documentation

## 2016-04-05 DIAGNOSIS — E119 Type 2 diabetes mellitus without complications: Secondary | ICD-10-CM | POA: Insufficient documentation

## 2016-04-05 DIAGNOSIS — X509XXA Other and unspecified overexertion or strenuous movements or postures, initial encounter: Secondary | ICD-10-CM | POA: Insufficient documentation

## 2016-04-05 DIAGNOSIS — S99921A Unspecified injury of right foot, initial encounter: Secondary | ICD-10-CM | POA: Diagnosis present

## 2016-04-05 MED ORDER — IBUPROFEN 600 MG PO TABS: 600.0000 mg | ORAL_TABLET | Freq: Four times a day (QID) | ORAL | 0 refills | Status: DC | PRN

## 2016-04-05 MED ORDER — HYDROCODONE-ACETAMINOPHEN 5-325 MG PO TABS: 1.0000 | ORAL_TABLET | ORAL | 0 refills | Status: DC | PRN

## 2016-04-05 NOTE — ED Triage Notes (Signed)
Pt reports she tripped up some steps causing injury to right foot. Foot has some swelling with pain to touch.

## 2016-04-05 NOTE — ED Provider Notes (Addendum)
[facility] DEPT Provider Note   CSN: [id number] Arrival date & time: 04/05/16  [id number]     History   Chief Complaint Chief Complaint  Patient presents with  . Foot Injury    HPI April Larson is a 66 y.o. female.  Pt was at a [employer] party last night.  She was going down some steps and missed a step and twisted her right foot.  Initially, she was able to walk, but she could not this morning.  She took tylenol pta and does not want anything else for pain.  She sustained no other injuries in the fall.      Past Medical History:  Diagnosis Date  . Diabetes mellitus without complication ([place])   . Substance abuse     There are no active problems to display for this patient.   No past surgical history on file.  OB History    No data available       Home Medications    Prior to Admission medications   Medication Sig Start Date End Date Taking? Authorizing Provider  Biotin 10 MG CAPS Take 1 tablet by mouth daily.    Historical Provider, MD  cholecalciferol (VITAMIN D) 400 UNITS TABS Take by mouth.    Historical Provider, MD  ibuprofen (ADVIL,MOTRIN) 600 MG tablet Take 1 tablet (600 mg total) by mouth every 6 (six) hours as needed. 04/05/16   Isla Pence, MD  insulin detemir (LEVEMIR) 100 UNIT/ML injection Inject 45 Units into the skin at bedtime.     Historical Provider, MD  Liraglutide (VICTOZA) 18 MG/3ML SOPN Inject 1.8 Units into the skin.     Historical Provider, MD  loratadine (CLARITIN) 10 MG tablet Take 10 mg by mouth daily.    Historical Provider, MD  losartan-hydrochlorothiazide (HYZAAR) 50-12.5 MG per tablet Take 1 tablet by mouth daily.    Historical Provider, MD  metFORMIN (GLUCOPHAGE) 500 MG tablet Take 500 mg by mouth 2 (two) times daily with a meal.     Historical Provider, MD  pioglitazone (ACTOS) 15 MG tablet Take 15 mg by mouth daily.    Historical Provider, MD  Pitavastatin Calcium 4 MG TABS Take 1 tablet by mouth daily.     Historical Provider, MD    Family History Family History  Problem Relation Age of Onset  . Cancer Mother   . Diabetes Father   . Cancer Brother     Social History Social History  Substance Use Topics  . Smoking status: Never Smoker  . Smokeless tobacco: Not on file  . Alcohol use No     Allergies   Patient has no known allergies.   Review of Systems Review of Systems  Musculoskeletal:       Right foot pain/swelling  All other systems reviewed and are negative.    Physical Exam Updated Vital Signs BP 133/78   Pulse 81   Temp 98.7 F (37.1 C) (Oral)   Resp 18   SpO2 99%   Physical Exam  Constitutional: She is oriented to person, place, and time. She appears well-developed and well-nourished.  HENT:  Head: Normocephalic and atraumatic.  Right Ear: External ear normal.  Left Ear: External ear normal.  Nose: Nose normal.  Mouth/Throat: Oropharynx is clear and moist.  Eyes: Conjunctivae and EOM are normal. Pupils are equal, round, and reactive to light.  Neck: Normal range of motion. Neck supple.  Cardiovascular: Normal rate, regular rhythm, normal heart sounds and intact distal pulses.  Pulmonary/Chest: Effort normal and breath sounds normal.  Abdominal: Soft. Bowel sounds are normal.  Musculoskeletal:       Feet:  Neurological: She is alert and oriented to person, place, and time.  Skin: Skin is warm and dry.  Psychiatric: She has a normal mood and affect. Her behavior is normal. Judgment and thought content normal.  Nursing note and vitals reviewed.    ED Treatments / Results  Labs (all labs ordered are listed, but only abnormal results are displayed) Labs Reviewed - No data to display  EKG  EKG Interpretation None       Radiology Dg Foot Complete Right  Result Date: 04/05/2016 CLINICAL DATA:  Pt reports she missed a step outside her house around midnight last night and hit her right foot against the concrete; pt has a bruise on the top  of her foot near the head of the 4th metatarsal; pt reports she was unable to move her toes this AM when she woke; pt is diabetic; no prior history of injury to the foot but she has injured her right ankle; no surgery EXAM: RIGHT FOOT COMPLETE - 3+ VIEW COMPARISON:  None. FINDINGS: There is no evidence of fracture or dislocation. There is no evidence of arthropathy or other focal bone abnormality. Soft tissues are unremarkable. IMPRESSION: Negative. Electronically Signed   By: Lajean Manes M.D.   On: 04/05/2016 10:56    Procedures Procedures (including critical care time)  Medications Ordered in ED Medications - No data to display   Initial Impression / Assessment and Plan / ED Course  I have reviewed the triage vital signs and the nursing notes.  Pertinent labs & imaging results that were available during my care of the patient were reviewed by me and considered in my medical decision making (see chart for details).  Clinical Course    Pt will be given a fracture shoe and crutches.  She is given the number for ortho.  She knows to return if worse.  Pt initially given a rx for lortab, but pt did not want and it was shredded.  She has been a recovering addict for 25 years and did not want the temptation.  Final Clinical Impressions(s) / ED Diagnoses   Final diagnoses:  Sprain of right foot, initial encounter    New Prescriptions New Prescriptions   IBUPROFEN (ADVIL,MOTRIN) 600 MG TABLET    Take 1 tablet (600 mg total) by mouth every 6 (six) hours as needed.     Isla Pence, MD 04/05/16 1106    Isla Pence, MD 04/05/16 [phone]

## 2016-04-05 NOTE — ED Notes (Signed)
Teach back demonstration crutches and post op shoe.

## 2016-04-27 ENCOUNTER — Encounter (HOSPITAL_COMMUNITY): Payer: Self-pay | Admitting: *Deleted

## 2016-04-27 ENCOUNTER — Ambulatory Visit (INDEPENDENT_AMBULATORY_CARE_PROVIDER_SITE_OTHER): Payer: BLUE CROSS/BLUE SHIELD

## 2016-04-27 ENCOUNTER — Ambulatory Visit (HOSPITAL_COMMUNITY)
Admission: EM | Admit: 2016-04-27 | Discharge: 2016-04-27 | Disposition: A | Discharge status: Home / Self Care | Payer: BLUE CROSS/BLUE SHIELD | Attending: Family Medicine | Admitting: Family Medicine

## 2016-04-27 DIAGNOSIS — S62306A Unspecified fracture of fifth metacarpal bone, right hand, initial encounter for closed fracture: Secondary | ICD-10-CM | POA: Diagnosis not present

## 2016-04-27 NOTE — ED Provider Notes (Signed)
[facility]    CSN: [id number] Arrival date & time: 04/27/16  1231     History   Chief Complaint Chief Complaint  Patient presents with  . Hand Injury    HPI April Larson is a 66 y.o. female.   The history is provided by the patient.  Hand Injury  Location:  Hand Hand location:  L hand Injury: yes   Time since incident:  1 day Mechanism of injury: fall   Pain details:    Severity:  Mild   Progression:  Worsening Dislocation: no   Prior injury to area:  No   Past Medical History:  Diagnosis Date  . Diabetes mellitus without complication ([place])   . Substance abuse     There are no active problems to display for this patient.   History reviewed. No pertinent surgical history.  OB History    No data available       Home Medications    Prior to Admission medications   Medication Sig Start Date End Date Taking? Authorizing Provider  Biotin 10 MG CAPS Take 1 tablet by mouth daily.   Yes Historical Provider, MD  cholecalciferol (VITAMIN D) 400 UNITS TABS Take by mouth.   Yes Historical Provider, MD  insulin detemir (LEVEMIR) 100 UNIT/ML injection Inject 45 Units into the skin at bedtime.    Yes Historical Provider, MD  Liraglutide (VICTOZA) 18 MG/3ML SOPN Inject 1.8 Units into the skin.    Yes Historical Provider, MD  loratadine (CLARITIN) 10 MG tablet Take 10 mg by mouth daily.   Yes Historical Provider, MD  losartan-hydrochlorothiazide (HYZAAR) 50-12.5 MG per tablet Take 1 tablet by mouth daily.   Yes Historical Provider, MD  metFORMIN (GLUCOPHAGE) 500 MG tablet Take 500 mg by mouth 2 (two) times daily with a meal.    Yes Historical Provider, MD  pioglitazone (ACTOS) 15 MG tablet Take 15 mg by mouth daily.   Yes Historical Provider, MD  Pitavastatin Calcium 4 MG TABS Take 1 tablet by mouth daily.   Yes Historical Provider, MD  ibuprofen (ADVIL,MOTRIN) 600 MG tablet Take 1 tablet (600 mg total) by mouth every 6 (six) hours as needed.  04/05/16   Isla Pence, MD    Family History Family History  Problem Relation Age of Onset  . Cancer Mother   . Diabetes Father   . Cancer Brother     Social History Social History  Substance Use Topics  . Smoking status: Never Smoker  . Smokeless tobacco: Never Used  . Alcohol use No     Allergies   Patient has no known allergies.   Review of Systems Review of Systems  Musculoskeletal: Positive for joint swelling.  Skin: Positive for color change.  All other systems reviewed and are negative.    Physical Exam Triage Vital Signs ED Triage Vitals  Enc Vitals Group     BP 04/27/16 1351 157/99     Pulse Rate 04/27/16 1351 78     Resp 04/27/16 1351 16     Temp 04/27/16 1351 98.4 F (36.9 C)     Temp Source 04/27/16 1351 Oral     SpO2 04/27/16 1351 100 %     Weight --      Height --      Head Circumference --      Peak Flow --      Pain Score 04/27/16 1353 8     Pain Loc --      Pain  Edu? --      Excl. in [place]? --    No data found.   Updated Vital Signs BP 157/99 (BP Location: Right Arm)   Pulse 78   Temp 98.4 F (36.9 C) (Oral)   Resp 16   SpO2 100%   Visual Acuity Right Eye Distance:   Left Eye Distance:   Bilateral Distance:    Right Eye Near:   Left Eye Near:    Bilateral Near:     Physical Exam  Constitutional: She is oriented to person, place, and time. She appears well-developed and well-nourished.  Musculoskeletal: She exhibits tenderness and deformity.  Neurological: She is alert and oriented to person, place, and time.  Skin:  Left palmar ecchymosis and sts .  Nursing note and vitals reviewed.    UC Treatments / Results  Labs (all labs ordered are listed, but only abnormal results are displayed) Labs Reviewed - No data to display  EKG  EKG Interpretation None       Radiology Dg Hand Complete Left  Result Date: 04/27/2016 CLINICAL DATA:  Fall last night, significant soft tissue swelling EXAM: LEFT HAND - COMPLETE 3+  VIEW COMPARISON:  None. FINDINGS: Three views of the left hand submitted. There is impacted fracture distal aspect of fifth metacarpal. Question subtle nondisplaced fracture of the base of fifth metacarpal. Mild displaced fracture distal aspect of fourth metacarpal. There is significant soft tissue swelling metacarpal region and dorsal hand. IMPRESSION: There is impacted fracture distal aspect of fifth metacarpal. Question subtle nondisplaced fracture of the base of fifth metacarpal. Mild displaced fracture distal aspect of fourth metacarpal. There is significant soft tissue swelling metacarpal region and dorsal hand. Electronically Signed   By: Lahoma Crocker M.D.   On: 04/27/2016 14:14    Procedures Procedures (including critical care time)  Medications Ordered in UC Medications - No data to display   Initial Impression / Assessment and Plan / UC Course  I have reviewed the triage vital signs and the nursing notes.  Pertinent labs & imaging results that were available during my care of the patient were reviewed by me and considered in my medical decision making (see chart for details).     Discussed with dr Amedeo Plenty and plans as advised.  Final Clinical Impressions(s) / UC Diagnoses   Final diagnoses:  None    New Prescriptions New Prescriptions   No medications on file     Billy Fischer, MD 04/27/16 1558

## 2016-04-27 NOTE — Discharge Instructions (Signed)
See dr Amedeo Plenty at noon on wed.

## 2016-04-27 NOTE — ED Triage Notes (Signed)
Patient reports mechanical fall last night and used left hand to brace herself, moderate amount of swelling noted to left hand and bruising to anterior aspect of hand. Reports distally fingers feel "funny"

## 2016-04-27 NOTE — Progress Notes (Signed)
Orthopedic Tech Progress Note Patient Details:  April Larson 01-21-1951 [id number]  Ortho Devices Type of Ortho Device: Ace wrap, Volar splint Ortho Device/Splint Location: LUE Ortho Device/Splint Interventions: Ordered, Application   [name] 04/27/2016, 4:24 PM

## 2016-04-27 NOTE — ED Notes (Signed)
Patient took 600mg  of ibuprofen

## 2016-04-30 ENCOUNTER — Encounter (HOSPITAL_COMMUNITY): Payer: Self-pay | Admitting: Emergency Medicine

## 2016-04-30 ENCOUNTER — Ambulatory Visit (HOSPITAL_COMMUNITY)
Admission: EM | Admit: 2016-04-30 | Discharge: 2016-04-30 | Disposition: A | Discharge status: Home / Self Care | Payer: BLUE CROSS/BLUE SHIELD | Attending: Family Medicine | Admitting: Family Medicine

## 2016-04-30 DIAGNOSIS — R0789 Other chest pain: Secondary | ICD-10-CM | POA: Diagnosis not present

## 2016-04-30 MED ORDER — DICLOFENAC SODIUM 75 MG PO TBEC: 75.0000 mg | DELAYED_RELEASE_TABLET | Freq: Two times a day (BID) | ORAL | 0 refills | Status: DC

## 2016-04-30 MED ORDER — METHOCARBAMOL 500 MG PO TABS: 500.0000 mg | ORAL_TABLET | Freq: Two times a day (BID) | ORAL | 0 refills | Status: DC

## 2016-04-30 NOTE — Discharge Instructions (Signed)
I am treating you for musculoskeletal pain. I have sent two prescriptions to your pharmacy. Diclofenac, take 1 tablet twice a day, and Robaxin, take one tablet twice a day. Robaxin may cause drowsiness so do not drink alcohol or drive while taking. Should your pain fail to resolve or worsen, follow up with your primary care provider or return to clinic as needed.

## 2016-04-30 NOTE — ED Provider Notes (Signed)
CSN: [id number]     Arrival date & time 04/30/16  1052 History   First MD Initiated Contact with Patient 04/30/16 1225     Chief Complaint  Patient presents with  . Chest Pain   (Consider location/radiation/quality/duration/timing/severity/associated sxs/prior Treatment) 66 year old female presents to clinic with chief complaint of left sided rib pain. She fell on 04/27/16 and was evaluated in clinic for metacarpale fracture. She has since had her hand and wrist casted. She reports she has had rib pain that has been getting worse, worse when she lays on it, and worse with deep breath. She denies cough, denies shortness of breath.   The history is provided by the patient.  Chest Pain    Past Medical History:  Diagnosis Date  . Diabetes mellitus without complication ([place])   . Substance abuse    History reviewed. No pertinent surgical history. Family History  Problem Relation Age of Onset  . Cancer Mother   . Diabetes Father   . Cancer Brother    Social History  Substance Use Topics  . Smoking status: Never Smoker  . Smokeless tobacco: Never Used  . Alcohol use No   OB History    No data available     Review of Systems  Reason unable to perform ROS: as covered in HPI.  Cardiovascular: Positive for chest pain.  All other systems reviewed and are negative.   Allergies  Patient has no known allergies.  Home Medications   Prior to Admission medications   Medication Sig Start Date End Date Taking? Authorizing Provider  Biotin 10 MG CAPS Take 1 tablet by mouth daily.   Yes Historical Provider, MD  cholecalciferol (VITAMIN D) 400 UNITS TABS Take by mouth.   Yes Historical Provider, MD  ibuprofen (ADVIL,MOTRIN) 600 MG tablet Take 1 tablet (600 mg total) by mouth every 6 (six) hours as needed. 04/05/16  Yes Isla Pence, MD  insulin detemir (LEVEMIR) 100 UNIT/ML injection Inject 45 Units into the skin at bedtime.    Yes Historical Provider, MD  Liraglutide (VICTOZA) 18 MG/3ML  SOPN Inject 1.8 Units into the skin.    Yes Historical Provider, MD  loratadine (CLARITIN) 10 MG tablet Take 10 mg by mouth daily.   Yes Historical Provider, MD  losartan-hydrochlorothiazide (HYZAAR) 50-12.5 MG per tablet Take 1 tablet by mouth daily.   Yes Historical Provider, MD  metFORMIN (GLUCOPHAGE) 500 MG tablet Take 500 mg by mouth 2 (two) times daily with a meal.    Yes Historical Provider, MD  pioglitazone (ACTOS) 15 MG tablet Take 15 mg by mouth daily.   Yes Historical Provider, MD  Pitavastatin Calcium 4 MG TABS Take 1 tablet by mouth daily.   Yes Historical Provider, MD  diclofenac (VOLTAREN) 75 MG EC tablet Take 1 tablet (75 mg total) by mouth 2 (two) times daily. 04/30/16   Barnet Glasgow, NP  methocarbamol (ROBAXIN) 500 MG tablet Take 1 tablet (500 mg total) by mouth 2 (two) times daily. 04/30/16   Barnet Glasgow, NP   Meds Ordered and Administered this Visit  Medications - No data to display  BP 155/75 (BP Location: Right Arm)   Pulse 85   Temp 98.1 F (36.7 C) (Oral)   Resp 16   SpO2 100%  No data found.   Physical Exam  Constitutional: She is oriented to person, place, and time. She appears well-developed and well-nourished. No distress.  Cardiovascular: Normal rate and regular rhythm.   Pulmonary/Chest: Effort normal and breath sounds  normal. No respiratory distress. She has no decreased breath sounds. She has no wheezes. She has no rhonchi. She has no rales. She exhibits tenderness and bony tenderness. She exhibits no mass, no crepitus, no edema, no deformity and no retraction.    Abdominal: Soft. Bowel sounds are normal.  Neurological: She is alert and oriented to person, place, and time.  Skin: Skin is warm and dry. Capillary refill takes less than 2 seconds. She is not diaphoretic.  Psychiatric: She has a normal mood and affect.  Nursing note and vitals reviewed.   Urgent Care Course     Procedures (including critical care time)  Labs Review Labs  Reviewed - No data to display  Imaging Review No results found.   Visual Acuity Review  Right Eye Distance:   Left Eye Distance:   Bilateral Distance:    Right Eye Near:   Left Eye Near:    Bilateral Near:         MDM   1. Chest wall pain   I am treating you for musculoskeletal pain. I have sent two prescriptions to your pharmacy. Diclofenac, take 1 tablet twice a day, and Robaxin, take one tablet twice a day. Robaxin may cause drowsiness so do not drink alcohol or drive while taking. Should your pain fail to resolve or worsen, follow up with your primary care provider or return to clinic as needed.       Barnet Glasgow, NP 04/30/16 [place], NP 04/30/16 [phone]

## 2016-04-30 NOTE — ED Triage Notes (Signed)
Pt c/o left sided rib pain onset 3 days.... Reports she was seen here on 04/27/16 for fx of 5th metacarpal left hand due to fall  Pt states she started feeling pain on left side of rib so she came in to make sure nothing else is broken.   Pain increases w/activity  A&O x4... NAD

## 2016-08-10 ENCOUNTER — Other Ambulatory Visit: Payer: Self-pay | Admitting: Internal Medicine

## 2016-08-10 DIAGNOSIS — Z1231 Encounter for screening mammogram for malignant neoplasm of breast: Secondary | ICD-10-CM

## 2016-08-26 ENCOUNTER — Ambulatory Visit
Admission: RE | Admit: 2016-08-26 | Discharge: 2016-08-26 | Disposition: A | Discharge status: Home / Self Care | Payer: BLUE CROSS/BLUE SHIELD | Source: Ambulatory Visit | Attending: Internal Medicine | Admitting: Internal Medicine

## 2016-08-26 DIAGNOSIS — Z1231 Encounter for screening mammogram for malignant neoplasm of breast: Secondary | ICD-10-CM

## 2016-09-10 ENCOUNTER — Other Ambulatory Visit: Payer: Self-pay | Admitting: Internal Medicine

## 2016-09-10 DIAGNOSIS — R1011 Right upper quadrant pain: Secondary | ICD-10-CM

## 2016-09-13 ENCOUNTER — Other Ambulatory Visit: Payer: BLUE CROSS/BLUE SHIELD

## 2016-09-20 ENCOUNTER — Other Ambulatory Visit: Payer: BLUE CROSS/BLUE SHIELD

## 2016-09-20 ENCOUNTER — Ambulatory Visit
Admission: RE | Admit: 2016-09-20 | Discharge: 2016-09-20 | Disposition: A | Discharge status: Home / Self Care | Payer: BLUE CROSS/BLUE SHIELD | Source: Ambulatory Visit | Attending: Internal Medicine | Admitting: Internal Medicine

## 2016-09-20 DIAGNOSIS — R1011 Right upper quadrant pain: Secondary | ICD-10-CM

## 2016-12-08 DIAGNOSIS — E1165 Type 2 diabetes mellitus with hyperglycemia: Secondary | ICD-10-CM | POA: Diagnosis not present

## 2016-12-08 DIAGNOSIS — I10 Essential (primary) hypertension: Secondary | ICD-10-CM | POA: Diagnosis not present

## 2016-12-08 DIAGNOSIS — J309 Allergic rhinitis, unspecified: Secondary | ICD-10-CM | POA: Diagnosis not present

## 2017-01-04 DIAGNOSIS — Z23 Encounter for immunization: Secondary | ICD-10-CM | POA: Diagnosis not present

## 2017-02-02 DIAGNOSIS — J320 Chronic maxillary sinusitis: Secondary | ICD-10-CM | POA: Diagnosis not present

## 2017-03-03 DIAGNOSIS — Z23 Encounter for immunization: Secondary | ICD-10-CM | POA: Diagnosis not present

## 2017-03-03 DIAGNOSIS — I10 Essential (primary) hypertension: Secondary | ICD-10-CM | POA: Diagnosis not present

## 2017-03-03 DIAGNOSIS — E1165 Type 2 diabetes mellitus with hyperglycemia: Secondary | ICD-10-CM | POA: Diagnosis not present

## 2017-05-03 DIAGNOSIS — E1351 Other specified diabetes mellitus with diabetic peripheral angiopathy without gangrene: Secondary | ICD-10-CM | POA: Diagnosis not present

## 2017-05-03 DIAGNOSIS — S9032XA Contusion of left foot, initial encounter: Secondary | ICD-10-CM | POA: Diagnosis not present

## 2017-05-03 DIAGNOSIS — M79672 Pain in left foot: Secondary | ICD-10-CM | POA: Diagnosis not present

## 2017-06-09 DIAGNOSIS — N08 Glomerular disorders in diseases classified elsewhere: Secondary | ICD-10-CM | POA: Diagnosis not present

## 2017-06-09 DIAGNOSIS — N182 Chronic kidney disease, stage 2 (mild): Secondary | ICD-10-CM | POA: Diagnosis not present

## 2017-06-09 DIAGNOSIS — E1122 Type 2 diabetes mellitus with diabetic chronic kidney disease: Secondary | ICD-10-CM | POA: Diagnosis not present

## 2017-06-09 DIAGNOSIS — E559 Vitamin D deficiency, unspecified: Secondary | ICD-10-CM | POA: Diagnosis not present

## 2017-06-09 DIAGNOSIS — I129 Hypertensive chronic kidney disease with stage 1 through stage 4 chronic kidney disease, or unspecified chronic kidney disease: Secondary | ICD-10-CM | POA: Diagnosis not present

## 2017-08-20 ENCOUNTER — Emergency Department (HOSPITAL_COMMUNITY): Payer: Medicare Other

## 2017-08-20 ENCOUNTER — Emergency Department (HOSPITAL_COMMUNITY)
Admission: EM | Admit: 2017-08-20 | Discharge: 2017-08-21 | Disposition: A | Discharge status: Home / Self Care | Payer: Medicare Other | Attending: Emergency Medicine | Admitting: Emergency Medicine

## 2017-08-20 ENCOUNTER — Encounter (HOSPITAL_COMMUNITY): Payer: Self-pay | Admitting: Emergency Medicine

## 2017-08-20 DIAGNOSIS — Z794 Long term (current) use of insulin: Secondary | ICD-10-CM | POA: Diagnosis not present

## 2017-08-20 DIAGNOSIS — R05 Cough: Secondary | ICD-10-CM | POA: Diagnosis not present

## 2017-08-20 DIAGNOSIS — Z79899 Other long term (current) drug therapy: Secondary | ICD-10-CM | POA: Diagnosis not present

## 2017-08-20 DIAGNOSIS — E119 Type 2 diabetes mellitus without complications: Secondary | ICD-10-CM | POA: Insufficient documentation

## 2017-08-20 DIAGNOSIS — I10 Essential (primary) hypertension: Secondary | ICD-10-CM | POA: Insufficient documentation

## 2017-08-20 DIAGNOSIS — R059 Cough, unspecified: Secondary | ICD-10-CM

## 2017-08-20 DIAGNOSIS — R600 Localized edema: Secondary | ICD-10-CM | POA: Diagnosis not present

## 2017-08-20 DIAGNOSIS — R51 Headache: Secondary | ICD-10-CM | POA: Diagnosis not present

## 2017-08-20 HISTORY — DX: Essential (primary) hypertension: I10

## 2017-08-20 LAB — CBC WITH DIFFERENTIAL/PLATELET
BASOS ABS: 0.1 10*3/uL (ref 0.0–0.1)
BASOS PCT: 1 %
EOS PCT: 3 %
Eosinophils Absolute: 0.2 10*3/uL (ref 0.0–0.7)
HCT: 37.5 % (ref 36.0–46.0)
Hemoglobin: 12.1 g/dL (ref 12.0–15.0)
Lymphocytes Relative: 20 %
Lymphs Abs: 1.4 10*3/uL (ref 0.7–4.0)
MCH: 27.1 pg (ref 26.0–34.0)
MCHC: 32.3 g/dL (ref 30.0–36.0)
MCV: 83.9 fL (ref 78.0–100.0)
MONO ABS: 0.7 10*3/uL (ref 0.1–1.0)
Monocytes Relative: 10 %
Neutro Abs: 4.9 10*3/uL (ref 1.7–7.7)
Neutrophils Relative %: 66 %
PLATELETS: 360 10*3/uL (ref 150–400)
RBC: 4.47 MIL/uL (ref 3.87–5.11)
RDW: 15.7 % — AB (ref 11.5–15.5)
WBC: 7.3 10*3/uL (ref 4.0–10.5)

## 2017-08-20 LAB — COMPREHENSIVE METABOLIC PANEL
ALK PHOS: 94 U/L (ref 38–126)
ALT: 23 U/L (ref 14–54)
ANION GAP: 11 (ref 5–15)
AST: 48 U/L — ABNORMAL HIGH (ref 15–41)
Albumin: 4 g/dL (ref 3.5–5.0)
BILIRUBIN TOTAL: 0.8 mg/dL (ref 0.3–1.2)
BUN: 17 mg/dL (ref 6–20)
CALCIUM: 9.7 mg/dL (ref 8.9–10.3)
CO2: 26 mmol/L (ref 22–32)
Chloride: 102 mmol/L (ref 101–111)
Creatinine, Ser: 1.11 mg/dL — ABNORMAL HIGH (ref 0.44–1.00)
GFR calc non Af Amer: 51 mL/min — ABNORMAL LOW (ref >60)
GFR, EST AFRICAN AMERICAN: 59 mL/min — AB (ref >60)
Glucose, Bld: 81 mg/dL (ref 65–99)
Potassium: 5.5 mmol/L — ABNORMAL HIGH (ref 3.5–5.1)
Sodium: 139 mmol/L (ref 135–145)
TOTAL PROTEIN: 8 g/dL (ref 6.5–8.1)

## 2017-08-20 MED ORDER — SODIUM CHLORIDE 0.9 % IV BOLUS
1000.0000 mL | Freq: Once | INTRAVENOUS | Status: AC
  Administered 2017-08-20: 1000 mL via INTRAVENOUS

## 2017-08-20 NOTE — ED Provider Notes (Signed)
[facility] DEPT Provider Note   CSN: [id number] Arrival date & time: 08/20/17  1636     History   Chief Complaint Chief Complaint  Patient presents with  . Cough  . Leg Swelling    HPI Crystalina Vilikia Watkins [place] is a 67 y.o. female.  HPI  67 year old female presents with cough.  Cough started yesterday and seems to be worsening.  It is nonproductive but now is causing her to have a headache whenever she coughs.  She denies chest pain, shortness of breath, fevers.  She has also noticed left leg swelling starting about a week ago.  Started in her foot and has progressively been worsening.  She has had right leg swelling that is less prominent as well.  She had phlebitis about 10 years ago in the left leg but has not had any recurrence and no history of DVT.  She has a little bit of pain at her foot/ankle but no calf pain or redness.  No recent travel or surgeries.  No prior history of DVT and no estrogen use.  Past Medical History:  Diagnosis Date  . Diabetes mellitus without complication ([place])   . Hypertension   . Substance abuse ([place])     There are no active problems to display for this patient.   Past Surgical History:  Procedure Laterality Date  . EYE SURGERY       OB History   None      Home Medications    Prior to Admission medications   Medication Sig Start Date End Date Taking? Authorizing Provider  Biotin 10 MG CAPS Take 1 tablet by mouth daily.    [provider]  cholecalciferol (VITAMIN D) 400 UNITS TABS Take by mouth.    [provider]  diclofenac (VOLTAREN) 75 MG EC tablet Take 1 tablet (75 mg total) by mouth 2 (two) times daily. 04/30/16   Barnet Glasgow, NP  ibuprofen (ADVIL,MOTRIN) 600 MG tablet Take 1 tablet (600 mg total) by mouth every 6 (six) hours as needed. 04/05/16   Isla Pence, MD  insulin detemir (LEVEMIR) 100 UNIT/ML injection Inject 45 Units into the skin at bedtime.     [provider]  Liraglutide (VICTOZA) 18 MG/3ML SOPN Inject 1.8 Units into the skin.     [provider]  loratadine (CLARITIN) 10 MG tablet Take 10 mg by mouth daily.    [provider]  losartan-hydrochlorothiazide (HYZAAR) 50-12.5 MG per tablet Take 1 tablet by mouth daily.    [provider]  metFORMIN (GLUCOPHAGE) 500 MG tablet Take 500 mg by mouth 2 (two) times daily with a meal.     [provider]  methocarbamol (ROBAXIN) 500 MG tablet Take 1 tablet (500 mg total) by mouth 2 (two) times daily. 04/30/16   Barnet Glasgow, NP  pioglitazone (ACTOS) 15 MG tablet Take 15 mg by mouth daily.    [provider]  Pitavastatin Calcium 4 MG TABS Take 1 tablet by mouth daily.    [provider]    Family History Family History  Problem Relation Age of Onset  . Cancer Mother   . Diabetes Father   . Cancer Brother   . Breast cancer Maternal Aunt     Social History Social History   Tobacco Use  . Smoking status: Never Smoker  . Smokeless tobacco: Never Used  Substance Use Topics  . Alcohol use: No    Alcohol/week: 0.0 oz  . Drug use: No  Allergies   Patient has no known allergies.   Review of Systems Review of Systems  Constitutional: Negative for fever.  Respiratory: Positive for cough. Negative for shortness of breath.   Cardiovascular: Positive for leg swelling. Negative for chest pain.  Skin: Negative for color change.  Neurological: Positive for headaches (when coughing).  All other systems reviewed and are negative.    Physical Exam Updated Vital Signs BP (!) 176/83 (BP Location: Left Arm)   Pulse (!) 102   Temp 98.8 F (37.1 C) (Oral)   Resp 17   SpO2 99%   Physical Exam  Constitutional: She is oriented to person, place, and time. She appears well-developed and well-nourished. No distress.  HENT:  Head: Normocephalic and atraumatic.  Right Ear: External ear normal.  Left Ear: External ear normal.    Nose: Nose normal.  Eyes: Right eye exhibits no discharge. Left eye exhibits no discharge.  Cardiovascular: Normal rate, regular rhythm and normal heart sounds.  Pulses:      Dorsalis pedis pulses are 2+ on the right side, and 2+ on the left side.  HR ~ 100  Pulmonary/Chest: Effort normal and breath sounds normal. She has no wheezes. She has no rales.  Abdominal: Soft. There is no tenderness.  Musculoskeletal: She exhibits edema.  Mild pitting edema to RLE. Pitting edema to LLE from foot to proximal lower leg, more prominent compared to right. No erythema or tenderness. Normal ROM of ankle.  Neurological: She is alert and oriented to person, place, and time.  Skin: Skin is warm and dry. She is not diaphoretic.  Nursing note and vitals reviewed.    ED Treatments / Results  Labs (all labs ordered are listed, but only abnormal results are displayed) Labs Reviewed  COMPREHENSIVE METABOLIC PANEL  BRAIN NATRIURETIC PEPTIDE  CBC WITH DIFFERENTIAL/PLATELET  D-DIMER, QUANTITATIVE (NOT AT [facility])    EKG None  Radiology Dg Chest 2 View  Result Date: 08/20/2017 CLINICAL DATA:  Cough EXAM: CHEST - 2 VIEW COMPARISON:  09/20/2007 FINDINGS: Heart and mediastinal contours are within normal limits. No focal opacities or effusions. No acute bony abnormality. IMPRESSION: No active cardiopulmonary disease. Electronically Signed   By: Rolm Baptise M.D.   On: 08/20/2017 17:24    Procedures Procedures (including critical care time)  Medications Ordered in ED Medications - No data to display   Initial Impression / Assessment and Plan / ED Course  I have reviewed the triage vital signs and the nursing notes.  Pertinent labs & imaging results that were available during my care of the patient were reviewed by me and considered in my medical decision making (see chart for details).     Patient does not have shortness of breath but she does have some coughing.  Given the unilateral leg swelling,  d-dimer will be sent.  However I suspect this is more related to phlebitis and some mild bilateral leg swelling.  If the d-dimer is negative I think she can go home to follow-up with PCP for the swelling and cough.  If positive she will need CT scan and later a ultrasound of her lower extreme any.  Care to Dr. Florina Ou with labs pending.  Final Clinical Impressions(s) / ED Diagnoses   Final diagnoses:  None    ED Discharge Orders    None       Sherwood Gambler, MD 08/21/17 [id number]

## 2017-08-20 NOTE — ED Triage Notes (Signed)
Pt c/o constant dry cough yesterday and now coughing causes her head to hurt.  Pt states that she also has left lower leg swelling from below knee to foot x week. Tried elevating it and states that swelling would go down a little bit but would start again.

## 2017-08-21 LAB — D-DIMER, QUANTITATIVE: D-Dimer, Quant: 0.67 ug/mL-FEU — ABNORMAL HIGH (ref 0.00–0.50)

## 2017-08-21 LAB — BRAIN NATRIURETIC PEPTIDE: B Natriuretic Peptide: 15.5 pg/mL (ref 0.0–100.0)

## 2017-08-21 MED ORDER — HYDROCOD POLST-CPM POLST ER 10-8 MG/5ML PO SUER: 5.0000 mL | Freq: Two times a day (BID) | ORAL | 0 refills | Status: DC | PRN

## 2017-08-21 NOTE — ED Provider Notes (Signed)
Nursing notes and vitals signs, including pulse oximetry, reviewed.  Summary of this visit's results, reviewed by myself:  EKG:  EKG Interpretation  Date/Time:    Ventricular Rate:    PR Interval:    QRS Duration:   QT Interval:    QTC Calculation:   R Axis:     Text Interpretation:         Labs:  Results for orders placed or performed during the hospital encounter of 08/20/17 (from the past 24 hour(s))  Comprehensive metabolic panel     Status: Abnormal   Collection Time: 08/20/17  9:05 PM  Result Value Ref Range   Sodium 139 135 - 145 mmol/L   Potassium 5.5 (H) 3.5 - 5.1 mmol/L   Chloride 102 101 - 111 mmol/L   CO2 26 22 - 32 mmol/L   Glucose, Bld 81 65 - 99 mg/dL   BUN 17 6 - 20 mg/dL   Creatinine, Ser 1.11 (H) 0.44 - 1.00 mg/dL   Calcium 9.7 8.9 - 10.3 mg/dL   Total Protein 8.0 6.5 - 8.1 g/dL   Albumin 4.0 3.5 - 5.0 g/dL   AST 48 (H) 15 - 41 U/L   ALT 23 14 - 54 U/L   Alkaline Phosphatase 94 38 - 126 U/L   Total Bilirubin 0.8 0.3 - 1.2 mg/dL   GFR calc non Af Amer 51 (L) >60 mL/min   GFR calc Af Amer 59 (L) >60 mL/min   Anion gap 11 5 - 15  Brain natriuretic peptide     Status: None   Collection Time: 08/20/17 11:23 PM  Result Value Ref Range   B Natriuretic Peptide 15.5 0.0 - 100.0 pg/mL  CBC with Differential/Platelet     Status: Abnormal   Collection Time: 08/20/17 11:23 PM  Result Value Ref Range   WBC 7.3 4.0 - 10.5 K/uL   RBC 4.47 3.87 - 5.11 MIL/uL   Hemoglobin 12.1 12.0 - 15.0 g/dL   HCT 37.5 36.0 - 46.0 %   MCV 83.9 78.0 - 100.0 fL   MCH 27.1 26.0 - 34.0 pg   MCHC 32.3 30.0 - 36.0 g/dL   RDW 15.7 (H) 11.5 - 15.5 %   Platelets 360 150 - 400 K/uL   Neutrophils Relative % 66 %   Neutro Abs 4.9 1.7 - 7.7 K/uL   Lymphocytes Relative 20 %   Lymphs Abs 1.4 0.7 - 4.0 K/uL   Monocytes Relative 10 %   Monocytes Absolute 0.7 0.1 - 1.0 K/uL   Eosinophils Relative 3 %   Eosinophils Absolute 0.2 0.0 - 0.7 K/uL   Basophils Relative 1 %   Basophils  Absolute 0.1 0.0 - 0.1 K/uL  D-dimer, quantitative (not at [facility])     Status: Abnormal   Collection Time: 08/20/17 11:23 PM  Result Value Ref Range   D-Dimer, Quant 0.67 (H) 0.00 - 0.50 ug/mL-FEU    Imaging Studies: Dg Chest 2 View  Result Date: 08/20/2017 CLINICAL DATA:  Cough EXAM: CHEST - 2 VIEW COMPARISON:  09/20/2007 FINDINGS: Heart and mediastinal contours are within normal limits. No focal opacities or effusions. No acute bony abnormality. IMPRESSION: No active cardiopulmonary disease. Electronically Signed   By: Rolm Baptise M.D.   On: 08/20/2017 17:24   1:23 AM The patient's d-dimer when age-adjusted is borderline.  She is having no chest pain or shortness of breath only a cough.  I have a low index of suspicion for a PE.  She is insisting on leaving  at this time but is requesting something for her cough.      Jennett Tarbell, Jenny Reichmann, MD 08/21/17 [phone]

## 2017-08-23 DIAGNOSIS — Z09 Encounter for follow-up examination after completed treatment for conditions other than malignant neoplasm: Secondary | ICD-10-CM | POA: Diagnosis not present

## 2017-08-23 DIAGNOSIS — R05 Cough: Secondary | ICD-10-CM | POA: Diagnosis not present

## 2017-09-02 DIAGNOSIS — M21961 Unspecified acquired deformity of right lower leg: Secondary | ICD-10-CM | POA: Diagnosis not present

## 2017-09-02 DIAGNOSIS — M21962 Unspecified acquired deformity of left lower leg: Secondary | ICD-10-CM | POA: Diagnosis not present

## 2017-09-02 DIAGNOSIS — D2372 Other benign neoplasm of skin of left lower limb, including hip: Secondary | ICD-10-CM | POA: Diagnosis not present

## 2017-09-02 DIAGNOSIS — M79672 Pain in left foot: Secondary | ICD-10-CM | POA: Diagnosis not present

## 2017-09-08 DIAGNOSIS — E1122 Type 2 diabetes mellitus with diabetic chronic kidney disease: Secondary | ICD-10-CM | POA: Diagnosis not present

## 2017-09-08 DIAGNOSIS — N182 Chronic kidney disease, stage 2 (mild): Secondary | ICD-10-CM | POA: Diagnosis not present

## 2017-09-08 DIAGNOSIS — Z1389 Encounter for screening for other disorder: Secondary | ICD-10-CM | POA: Diagnosis not present

## 2017-09-08 DIAGNOSIS — I129 Hypertensive chronic kidney disease with stage 1 through stage 4 chronic kidney disease, or unspecified chronic kidney disease: Secondary | ICD-10-CM | POA: Diagnosis not present

## 2017-09-08 DIAGNOSIS — N08 Glomerular disorders in diseases classified elsewhere: Secondary | ICD-10-CM | POA: Diagnosis not present

## 2017-09-08 LAB — BASIC METABOLIC PANEL
BUN: 19 (ref 4–21)
CREATININE: 1 (ref 0.5–1.1)
Glucose: 119
POTASSIUM: 4 (ref 3.4–5.3)
Sodium: 141 (ref 137–147)

## 2017-09-08 LAB — HEMOGLOBIN A1C: HEMOGLOBIN A1C: 6.4 — AB (ref 4.0–6.0)

## 2017-09-13 ENCOUNTER — Other Ambulatory Visit: Payer: Self-pay | Admitting: Internal Medicine

## 2017-09-13 DIAGNOSIS — Z1231 Encounter for screening mammogram for malignant neoplasm of breast: Secondary | ICD-10-CM

## 2017-09-15 ENCOUNTER — Other Ambulatory Visit: Payer: Self-pay | Admitting: Internal Medicine

## 2017-09-15 DIAGNOSIS — M21961 Unspecified acquired deformity of right lower leg: Secondary | ICD-10-CM | POA: Diagnosis not present

## 2017-09-15 DIAGNOSIS — M79672 Pain in left foot: Secondary | ICD-10-CM | POA: Diagnosis not present

## 2017-09-15 DIAGNOSIS — M21962 Unspecified acquired deformity of left lower leg: Secondary | ICD-10-CM | POA: Diagnosis not present

## 2017-09-15 DIAGNOSIS — R609 Edema, unspecified: Secondary | ICD-10-CM

## 2017-09-15 DIAGNOSIS — D2372 Other benign neoplasm of skin of left lower limb, including hip: Secondary | ICD-10-CM | POA: Diagnosis not present

## 2017-09-16 ENCOUNTER — Ambulatory Visit
Admission: RE | Admit: 2017-09-16 | Discharge: 2017-09-16 | Disposition: A | Discharge status: Home / Self Care | Payer: Medicare Other | Source: Ambulatory Visit | Attending: Internal Medicine | Admitting: Internal Medicine

## 2017-09-16 DIAGNOSIS — R609 Edema, unspecified: Secondary | ICD-10-CM

## 2017-09-16 DIAGNOSIS — R600 Localized edema: Secondary | ICD-10-CM | POA: Diagnosis not present

## 2017-09-21 ENCOUNTER — Ambulatory Visit: Payer: Medicare Other

## 2017-10-17 ENCOUNTER — Ambulatory Visit
Admission: RE | Admit: 2017-10-17 | Discharge: 2017-10-17 | Disposition: A | Discharge status: Home / Self Care | Payer: Medicare Other | Source: Ambulatory Visit | Attending: Internal Medicine | Admitting: Internal Medicine

## 2017-10-17 DIAGNOSIS — Z1231 Encounter for screening mammogram for malignant neoplasm of breast: Secondary | ICD-10-CM

## 2017-10-25 ENCOUNTER — Other Ambulatory Visit: Payer: Self-pay

## 2017-10-25 ENCOUNTER — Other Ambulatory Visit: Payer: Self-pay | Admitting: Pharmacist

## 2017-10-25 NOTE — Patient Outreach (Signed)
[facility]) Care Management  10/25/2017  April Larson 03-24-1951 [id number]   Telephone Screen  Referral Date: 10/25/17 Referral Source: MD office Referral Reason: " HTN, medication assistance, pt in doughnut hole" Insurance: [facility] Medicare   Outreach attempt # 1 to patient. Spoke with patient. Screening completed.   Social: Patient resides in her home alone. She states that her spouse passed away the beginning of te year. Patient is independent with ADLs/IADLs. She drives herself to appts. She denies any recent falls and uses no assistive devices.   Conditions: Per chart review, patient has PMH of DM, HTN and substance abuse. Patient reports that she is checking blood sugars 2x/day. Per patient cbgs are well controlled and ranging in the low 100s. She voices she is adhering to diet regimen.    Medications: Patient voices she is taking nine prescriptions meds and three OTC meds. She reports that she is has been in the doughnut hole for about three months. She has been getting samples of her insulin from MD office but no longer have any. She voices that she has bout two days left of inuslin (Xultophy) and she is also unable to afford Liablo. Patient able to manage her meds on her own.   Appointments: Patient followed by PCP.  Advance Directives: None. Patient interested in further info.   Consent: [facility] services reviewed and discussed. Patient gave verbal consent for services.   Plan: RN CM will send [place] referral for polypharmacy med review and possible med assistance.   Enzo Montgomery, RN,BSN,CCM [facility] Telephonic Care Management Coordinator Direct Phone: [phone] Toll Free: [phone] Fax: [phone]

## 2017-10-25 NOTE — Patient Outreach (Signed)
[facility]) Care Management  10/25/2017  April Larson 26-Jul-1950 [id number]   67 year old female referred to [facility] by Dr. Baird Cancer for medication assistance as patient in the coverage gap.  Per notes with [facility] RN, patient having difficulty affording Livalo (pitavastatin) and Xultophy (insulin degludec and liraglutide).      PMHx includes, but not limited to, type 2 diabetes mellitus, HTN, HLD, substance abuse.   Subjective: Successful call to April Larson today regarding medication assistance and medication reconciliation.  HIPAA identifiers verified. Patient agreeable to [facility] pharmacy services. Patient reports she has tried all statins and Livalo is the only statin she can tolerate.  She previously was taking Levemir and Victoza separately but was started on combination product, Xultophy, 2 months ago to reduce number of injections.  Patient states she is very happy with [name] but is willing to go back to separate products if necessary.    Objective:   Medications Reviewed Today    Reviewed by Rudean Haskell, RPH (Pharmacist) on 10/25/17 at 1433  Med List Status: <None>  Medication Order Taking? Sig Documenting Provider Last Dose Status Informant  aspirin EC 81 MG tablet [id number] Yes Take 81 mg by mouth daily. [provider] Taking Active   Biotin 10 MG CAPS [id number] Yes Take 1 tablet by mouth daily. [provider] Taking Active   cetirizine (ZYRTEC) 10 MG tablet [id number] Yes Take 10 mg by mouth at bedtime. [provider] Taking Active   chlorpheniramine-HYDROcodone Amanda Cockayne [facility]) 10-8 MG/5ML Latanya Presser [id number] Yes Take 5 mLs by mouth every 12 (twelve) hours as needed for cough. Molpus, John, MD Taking Active   cholecalciferol (VITAMIN D) 400 UNITS TABS [id number] Yes Take by mouth daily.  [provider] Taking Active   diclofenac (VOLTAREN) 75 MG EC tablet [id number] Yes Take 1 tablet  (75 mg total) by mouth 2 (two) times daily.  Patient taking differently:  Take 75 mg by mouth as needed.    Barnet Glasgow, NP Taking Active   ibuprofen (ADVIL,MOTRIN) 600 MG tablet [id number] Yes Take 1 tablet (600 mg total) by mouth every 6 (six) hours as needed. Isla Pence, MD Taking Active   Insulin Degludec-Liraglutide (XULTOPHY) 100-3.6 UNIT-MG/ML SOPN [id number] Yes Inject 20 Units into the skin every evening. [provider] Taking Active   loratadine (CLARITIN) 10 MG tablet [id number] Yes Take 10 mg by mouth daily. [provider] Taking Active   metFORMIN (GLUCOPHAGE) 500 MG tablet [id number] Yes Take 500 mg by mouth 2 (two) times daily with a meal.  [provider] Taking Active   Multiple Vitamins-Minerals (ONE-A-DAY WOMENS 50+ ADVANTAGE PO) [id number] Yes Take 1 tablet by mouth daily. [provider] Taking Active   omeprazole (PRILOSEC) 40 MG capsule [id number] Yes Take 40 mg by mouth daily. [provider] Taking Active   pioglitazone (ACTOS) 15 MG tablet [id number] Yes Take 15 mg by mouth daily. [provider] Taking Active   Pitavastatin Calcium 4 MG TABS [id number] Yes Take 1 tablet by mouth daily. [provider] Taking Active   telmisartan-hydrochlorothiazide (MICARDIS HCT) 40-12.5 MG tablet [id number] Yes Take 1 tablet by mouth daily. [provider] Taking Active           Drugs sorted by system:  Cardiovascular: aspirin 81, telmisartan-HCTZ, pitavastatin  Pulmonary/Allergy: cetirizine, loratadine, tussionex PRN  Gastrointestinal: omeprazole  Endocrine:insulin degludec-liraglutide, metformin, pioglitazone  Pain:diclofenac, ibuprofen  Vitamins/Minerals: biotin, cholecalciferol, MVI   Duplications in therapy:  -  cetirizine + loratadine: duplicate antihistamines, patient repots provider states to take loratadine qAM and cetirizine qPM for sinus infection prophylaxis -ibuprofen + diclofenac:  duplicate NSAIDs, patient aware to not take these together  Medication assistance:   Current insurance: [place] [employer] Complete Plan 2   1. Xultophy: not listed on current formulary.  Soliqua (another combination GLP-1 + long-acting insulin) is listed as preferred brand ($45 / 30 DS)  -Per [employer], co-pay for Xultophy = $190 / 30 DS (cash price: $752.99), likely Xultophy approved via coverage determination as a Tier 4 and patient still needs to pay plan deductibe $95 + $95 co-pay = $190.   2. Livalo: Tier 3, preferred brand $45 / 30 ds or $125 / 90 DS  -Per [facility] staff, no active prescription.  Pharmacist on lunch duty and not able to run test claim to verify pricing.    Extra Help / LIS: Patient already submitted application 2 weeks ago at local [place] office.  She is waiting on application decision.  Per [email or web address].biz/, no active LIS subsidy at this time.    Patient Assistance Programs:  1. Xultophy [employer]): $1000 TROOP requirement which patient has not met and does not believe she will be able to meet this year.  -Soliqua ([employer]): Must spend TROOP 5% household income which patient has not met  2. Livalo Nature conservation officer): does not meet eligibility criteria to apply  Plan: I will request new prescription for Livalo be sent to pharmacy by PCP to verify price.  I will reach out to MD regarding substitution for [facility].  Ralene Bathe, PharmD, [place] [phone]

## 2017-10-27 ENCOUNTER — Other Ambulatory Visit: Payer: Self-pay | Admitting: Pharmacist

## 2017-10-27 ENCOUNTER — Ambulatory Visit: Payer: Self-pay | Admitting: Pharmacist

## 2017-10-27 NOTE — Patient Outreach (Signed)
[facility]) Care Management  10/27/2017  April Larson May 28, 1950 [id number]  Care coordination call to Dr. Baird Cancer office regarding April Larson and April Larson. Message left requesting a return call.    Successful call placed to [name].  Patient reports she spoke with Dr. Lynder Parents office yesterday and provider recommended that she did not return to GLP-1 and long-acting insulin separately because of how well she was doing clinically on the combination product.  She states that the office has a 3 week supply of April Larson sample for her to pick up. Patient is agreeable to try the April (April Larson substitution) if provider approves.    Plan: I will await return call from Dr. Lynder Parents office.  I will reach back out tomorrow if I have not heard back yet.   Ralene Bathe, PharmD, [place] [phone]

## 2017-10-28 ENCOUNTER — Other Ambulatory Visit: Payer: Self-pay | Admitting: Pharmacist

## 2017-10-28 NOTE — Patient Outreach (Signed)
[facility]) Care Management  10/28/2017  April Larson 24-Jun-1950 [id number]   Call placed to Dr. Lynder Larson office to follow-up on medication questions.  Office is closed today and I was unable to leave a message.   Call placed to April Larson.  Patient reports she picked up samples of both Livalo and Xultophy.  She has not heard from the office regarding Xultophy substitution nor from pharmacy regarding medications being called in.   Plan: I will follow-up with office next week.   Ralene Bathe, PharmD, [place] [phone]

## 2017-11-01 ENCOUNTER — Other Ambulatory Visit: Payer: Self-pay | Admitting: Pharmacist

## 2017-11-01 NOTE — Patient Outreach (Signed)
[facility]) Care Management  11/01/2017  April Larson 10/09/1950 [id number]  Care coordination call to Dr. Lynder Larson office for update regarding medications Livalo and Xultophy.  Message left requesting return call.   Care coordination call to [facility].  No new medications for Soliqua or Livalo called in for patient.    Unsuccessful call to Ms. April Larson.  I left a HIPAA compliant voicemail requesting a return call.   Plan: I will follow-up with office and patient again tomorrow.   April Larson, PharmD, [place] [phone]

## 2017-11-02 ENCOUNTER — Other Ambulatory Visit: Payer: Self-pay | Admitting: Pharmacist

## 2017-11-02 NOTE — Patient Outreach (Signed)
[facility]) Care Management  11/02/2017  April Larson 1950-12-10 [id number]   Incoming call from Dr. Lynder Parents office yesterday afternoon.  Per staff, prescription for Livalo sent in on 7/29.   Samples of Soliqua are ready for patient to pick up to ensure patient tolerates and to assess dose.    Care coordination call placed to [place].  Per pharmacist, Livalo and Xultophy prescriptions were both picked up by patient with co-pays of $8.50.    Successful call to April Larson.  Patient confirms she was able to pick up both medications, Livalo and Xultophy, for $8.50, which is the co-pay for Extra Help.  Patient applied several weeks ago on her own and application has ben approved based on co-pays.  I counseled patient that if her provider calls these in for 90 day supply, the co-pay will remain the same ($8.50).  Patient voiced understanding and appreciation.  She states she will update her provider's office so Soliqua samples can be used for another patient.   No further medication assistance needs at this time.   Plan: I will close [facility] pharmacy case.  I am happy to help in the future as needed.   Ralene Bathe, PharmD, [place] [phone]

## 2018-01-11 ENCOUNTER — Encounter: Payer: Self-pay | Admitting: Internal Medicine

## 2018-01-11 DIAGNOSIS — I129 Hypertensive chronic kidney disease with stage 1 through stage 4 chronic kidney disease, or unspecified chronic kidney disease: Secondary | ICD-10-CM | POA: Insufficient documentation

## 2018-01-11 DIAGNOSIS — E1122 Type 2 diabetes mellitus with diabetic chronic kidney disease: Secondary | ICD-10-CM | POA: Insufficient documentation

## 2018-01-11 DIAGNOSIS — N182 Chronic kidney disease, stage 2 (mild): Principal | ICD-10-CM

## 2018-01-11 DIAGNOSIS — E119 Type 2 diabetes mellitus without complications: Secondary | ICD-10-CM | POA: Insufficient documentation

## 2018-01-12 ENCOUNTER — Other Ambulatory Visit: Payer: Self-pay

## 2018-01-12 ENCOUNTER — Encounter: Payer: Self-pay | Admitting: Internal Medicine

## 2018-01-12 ENCOUNTER — Ambulatory Visit (INDEPENDENT_AMBULATORY_CARE_PROVIDER_SITE_OTHER): Payer: Medicare Other | Admitting: Internal Medicine

## 2018-01-12 VITALS — BP 140/76 | HR 86 | Temp 98.5°F | Ht 63.0 in | Wt 180.4 lb

## 2018-01-12 DIAGNOSIS — I129 Hypertensive chronic kidney disease with stage 1 through stage 4 chronic kidney disease, or unspecified chronic kidney disease: Secondary | ICD-10-CM

## 2018-01-12 DIAGNOSIS — N182 Chronic kidney disease, stage 2 (mild): Secondary | ICD-10-CM | POA: Diagnosis not present

## 2018-01-12 DIAGNOSIS — Z23 Encounter for immunization: Secondary | ICD-10-CM | POA: Diagnosis not present

## 2018-01-12 DIAGNOSIS — N186 End stage renal disease: Secondary | ICD-10-CM | POA: Diagnosis not present

## 2018-01-12 DIAGNOSIS — E1122 Type 2 diabetes mellitus with diabetic chronic kidney disease: Secondary | ICD-10-CM | POA: Diagnosis not present

## 2018-01-12 DIAGNOSIS — J301 Allergic rhinitis due to pollen: Secondary | ICD-10-CM

## 2018-01-12 DIAGNOSIS — R413 Other amnesia: Secondary | ICD-10-CM

## 2018-01-12 DIAGNOSIS — Z794 Long term (current) use of insulin: Secondary | ICD-10-CM

## 2018-01-12 DIAGNOSIS — G4733 Obstructive sleep apnea (adult) (pediatric): Secondary | ICD-10-CM

## 2018-01-12 MED ORDER — PIOGLITAZONE HCL 15 MG PO TABS: 15.0000 mg | ORAL_TABLET | Freq: Every day | ORAL | 1 refills | Status: DC

## 2018-01-12 MED ORDER — PITAVASTATIN CALCIUM 4 MG PO TABS: 1.0000 | ORAL_TABLET | Freq: Every day | ORAL | 1 refills | Status: DC

## 2018-01-12 MED ORDER — TELMISARTAN-HCTZ 40-12.5 MG PO TABS: 1.0000 | ORAL_TABLET | Freq: Every day | ORAL | 1 refills | Status: DC

## 2018-01-12 MED ORDER — FLUTICASONE PROPIONATE 50 MCG/ACT NA SUSP: 2.0000 | Freq: Every day | NASAL | 5 refills | Status: DC

## 2018-01-12 NOTE — Progress Notes (Addendum)
Subjective:     Patient ID: April Larson , female    DOB: [birth year] , 67 y.o.   MRN: [id number]   Diabetes  She presents for her follow-up diabetic visit. She has type 2 diabetes mellitus. Pertinent negatives for hypoglycemia include no headaches. Pertinent negatives for diabetes include no blurred vision and no chest pain. Her breakfast blood glucose is taken between 8-9 am. Her breakfast blood glucose range is generally 70-90 mg/dl.  Hypertension  This is a chronic problem. The current episode started more than 1 year ago. Condition status: FAIR CONTROL. Pertinent negatives include no anxiety, blurred vision, chest pain or headaches.     Past Medical History:  Diagnosis Date  . Diabetes mellitus without complication ([place])   . Hypertension   . Substance abuse ([place])       Current Outpatient Medications:  .  aspirin EC 81 MG tablet, Take 81 mg by mouth daily., Disp: , Rfl:  .  Biotin 10 MG CAPS, Take 1 tablet by mouth daily., Disp: , Rfl:  .  cetirizine (ZYRTEC) 10 MG tablet, Take 10 mg by mouth at bedtime., Disp: , Rfl:  .  cholecalciferol (VITAMIN D) 400 UNITS TABS, Take by mouth daily. , Disp: , Rfl:  .  Insulin Degludec-Liraglutide (XULTOPHY) 100-3.6 UNIT-MG/ML SOPN, Inject 20 Units into the skin every evening., Disp: , Rfl:  .  loratadine (CLARITIN) 10 MG tablet, Take 10 mg by mouth daily., Disp: , Rfl:  .  metFORMIN (GLUCOPHAGE) 500 MG tablet, Take 500 mg by mouth 2 (two) times daily with a meal., Disp: , Rfl:  .  Multiple Vitamins-Minerals (ONE-A-DAY WOMENS 50+ ADVANTAGE PO), Take 1 tablet by mouth daily., Disp: , Rfl:  .  omeprazole (PRILOSEC) 40 MG capsule, Take 40 mg by mouth daily., Disp: , Rfl:  .  pioglitazone (ACTOS) 15 MG tablet, Take 1 tablet (15 mg total) by mouth daily., Disp: 90 tablet, Rfl: 1 .  Pitavastatin Calcium 4 MG TABS, Take 1 tablet (4 mg total) by mouth daily., Disp: 90 tablet, Rfl: 1 .  telmisartan-hydrochlorothiazide (MICARDIS HCT)  40-12.5 MG tablet, Take 1 tablet by mouth daily., Disp: 90 tablet, Rfl: 1 .  B-D ULTRAFINE III SHORT PEN 31G X 8 MM MISC, USE AS DIRECTED TWICE DAILY, Disp: 100 each, Rfl: 11 .  fluticasone (FLONASE) 50 MCG/ACT nasal spray, Place 2 sprays into both nostrils daily., Disp: 1 g, Rfl: 5   Review of Systems  Constitutional: Negative.   HENT: Negative.   Eyes: Negative.  Negative for blurred vision.  Respiratory: Positive for cough (PRODUCTIVE OF YELLOW SPUTUM).   Cardiovascular: Negative.  Negative for chest pain.  Gastrointestinal: Negative.   Neurological: Negative for headaches.       SHE C/O MEMORY LOSS. SHE REPORTS THIS HAS HAPPENED OVER PAST SEVERAL WEEKS. SHE DENIES CHANGE IN HER SLEEP PATTERN. NOT SURE WHY THIS IS OCCURRING.   Psychiatric/Behavioral: Negative.      Today's Vitals   01/12/18 1422  BP: 140/76  Pulse: 86  Temp: 98.5 F (36.9 C)  TempSrc: Oral  Weight: 180 lb 6.4 oz (81.8 kg)  Height: '5\' 3"'  (1.6 m)  PainSc: 0-No pain   Body mass index is 31.96 kg/m.   Objective:  Physical Exam  Constitutional: She is oriented to person, place, and time. She appears well-developed and well-nourished.  HENT:  Head: Normocephalic and atraumatic.  Eyes: EOM are normal.  Neck: Normal range of motion. Neck supple.  Cardiovascular: Normal rate, regular rhythm and  normal heart sounds.  Pulmonary/Chest: Effort normal and breath sounds normal.  Neurological: She is alert and oriented to person, place, and time.  Psychiatric: She has a normal mood and affect.        Assessment And Plan:     Type 2 diabetes mellitus with stage 2 chronic kidney disease, with long-term current use of insulin (HCC) - I WILL CHECK LABS AS LISTED BELOW.  SHE IS ENCOURAGED TO INCORPORATE NO LESS THAN FIVE DAYS WEEKLY OF MODERATE EXERCISE.  - Plan: CMP14+EGFR, Hemoglobin A1c, Lipid Profile, Ambulatory referral to Neurology  Chronic renal disease, stage II - CHRONIC.. I WILL CHECK A GFR, CR  TODAY.  Hypertensive nephropathy - FAIR CONTROL. SHE WILL CONTINUE WITH CURRENT MEDS FOR NOW. I THINK ELEVATION IS DUE TO HER RECENT COUGHING SPELLS.  - Plan: Ambulatory referral to Neurology  Non-seasonal allergic rhinitis due to pollen - I WILL ADD FLONASE TO HER CURRENT REGIMEN.   Memory loss - I WILL CHECK VIT B12, TSH AND SYPHILIS SCREEN TODAY. HER SX COULD POSSIBLY BE RELATED TO ELEVATED BS.  - Plan: Vitamin B12, TSH, RPR, Ambulatory referral to Neurology  OSA (obstructive sleep apnea) - I WILL REFER HER TO NEUROLOGY FOR FURTHER EVALUATION. SHE HAS NOT USED CPAP IN SEVERAL YEARS. - Plan: Ambulatory referral to Neurology  Need for influenza vaccination - SHE WAS GIVEN HIGH DOSE FLU VACCINE.  - Plan: Flu vaccine HIGH DOSE PF (Fluzone High dose)   Maximino Greenland, MD

## 2018-01-13 LAB — CMP14+EGFR
A/G RATIO: 1.4 (ref 1.2–2.2)
ALK PHOS: 110 IU/L (ref 39–117)
ALT: 13 IU/L (ref 0–32)
AST: 12 IU/L (ref 0–40)
Albumin: 4.2 g/dL (ref 3.6–4.8)
BUN / CREAT RATIO: 20 (ref 12–28)
BUN: 18 mg/dL (ref 8–27)
Bilirubin Total: <0.2 mg/dL (ref 0.0–1.2)
CO2: 24 mmol/L (ref 20–29)
Calcium: 9.9 mg/dL (ref 8.7–10.3)
Chloride: 100 mmol/L (ref 96–106)
Creatinine, Ser: 0.89 mg/dL (ref 0.57–1.00)
GFR calc Af Amer: 78 mL/min/{1.73_m2} (ref >59)
GFR, EST NON AFRICAN AMERICAN: 68 mL/min/{1.73_m2} (ref >59)
GLOBULIN, TOTAL: 3 g/dL (ref 1.5–4.5)
Glucose: 88 mg/dL (ref 65–99)
Potassium: 4.3 mmol/L (ref 3.5–5.2)
SODIUM: 140 mmol/L (ref 134–144)
Total Protein: 7.2 g/dL (ref 6.0–8.5)

## 2018-01-13 LAB — RPR: RPR Ser Ql: NONREACTIVE

## 2018-01-13 LAB — LIPID PANEL
Chol/HDL Ratio: 2.4 ratio (ref 0.0–4.4)
Cholesterol, Total: 155 mg/dL (ref 100–199)
HDL: 65 mg/dL (ref >39)
LDL Calculated: 71 mg/dL (ref 0–99)
Triglycerides: 97 mg/dL (ref 0–149)
VLDL CHOLESTEROL CAL: 19 mg/dL (ref 5–40)

## 2018-01-13 LAB — VITAMIN B12: VITAMIN B 12: 717 pg/mL (ref 232–1245)

## 2018-01-13 LAB — HEMOGLOBIN A1C
ESTIMATED AVERAGE GLUCOSE: 140 mg/dL
Hgb A1c MFr Bld: 6.5 % — ABNORMAL HIGH (ref 4.8–5.6)

## 2018-01-13 NOTE — Progress Notes (Signed)
Here are your lab results:  Your liver and kidney function are stable. Your hba1c is 6.5, this is pretty good. Your previous a1c was 6.4. Your cholesterol is great. Continue with current meds.  Your vitamin B12 level is within normal limits. You are negative for syphilis. Your thyroid results are pending. I will let you know when they are available. So far, I have not identified any cause for your symptoms.   Please let me know if you have any concerns.    Sincerely,    Lyllie Cobbins N. Baird Cancer, MD

## 2018-01-21 ENCOUNTER — Other Ambulatory Visit: Payer: Self-pay | Admitting: Nurse Practitioner

## 2018-01-21 ENCOUNTER — Other Ambulatory Visit: Payer: Self-pay | Admitting: Internal Medicine

## 2018-02-12 ENCOUNTER — Other Ambulatory Visit: Payer: Self-pay | Admitting: Internal Medicine

## 2018-03-04 ENCOUNTER — Other Ambulatory Visit: Payer: Self-pay | Admitting: Internal Medicine

## 2018-03-09 ENCOUNTER — Ambulatory Visit (INDEPENDENT_AMBULATORY_CARE_PROVIDER_SITE_OTHER): Payer: Medicare Other | Admitting: Neurology

## 2018-03-09 ENCOUNTER — Encounter: Payer: Self-pay | Admitting: Neurology

## 2018-03-09 VITALS — BP 126/67 | HR 93 | Ht 64.0 in | Wt 178.0 lb

## 2018-03-09 DIAGNOSIS — G4733 Obstructive sleep apnea (adult) (pediatric): Secondary | ICD-10-CM | POA: Diagnosis not present

## 2018-03-09 DIAGNOSIS — E669 Obesity, unspecified: Secondary | ICD-10-CM | POA: Diagnosis not present

## 2018-03-09 DIAGNOSIS — Z789 Other specified health status: Secondary | ICD-10-CM

## 2018-03-09 DIAGNOSIS — G4719 Other hypersomnia: Secondary | ICD-10-CM | POA: Diagnosis not present

## 2018-03-09 DIAGNOSIS — R419 Unspecified symptoms and signs involving cognitive functions and awareness: Secondary | ICD-10-CM

## 2018-03-09 DIAGNOSIS — R351 Nocturia: Secondary | ICD-10-CM

## 2018-03-09 NOTE — Progress Notes (Signed)
Subjective:    Patient ID: April Larson is a 67 y.o. female.  HPI     April Age, MD, [name] [facility] [address], [address] [place], [place] [address]    April Age, MD, [name] [facility] [address] [address] [place], [place] [address]  Dear Dr. Baird Larson,  I saw your patient, April Larson, upon your kind request in my sleep clinic today for initial consultation of her sleep disorder, in particular reevaluation of her prior diagnosis of OSA. The patient is unaccompanied today. As you know, April Larson is a 67 year old right-handed woman with an underlying medical history of type 2 diabetes, chronic kidney disease, hypertension, seasonal allergies, and obesity, who was previously diagnosed with obstructive sleep apnea. She was placed on CPAP therapy. She has not had reevaluation in years. Prior sleep study results are not available for my review today. I reviewed your office note from 01/12/2018. She was complaining of memory loss. she had blood work through your office on 01/12/2018, including RPR, B12, lipid profile, A1c, CMP, TSH. I reviewed the test results. Blood tests were benign, A1c 6.5. Her Epworth sleepiness score is 16 out of 24, fatigue score is 42 out of 63. She reports that she could not tolerate CPAP in the past. She reports that she had a sinus infection after she started using CPAP. She estimates that this was about 15 years ago. She is widowed; sadly, her husband passed away in May 30, 2017. She lives alone, no children, has 1 dog in the household, he sleeps on the bed with her. She is retired. She quit smoking in 2004, does not utilize alcohol and does not drink caffeine on a regular basis. Her bedtime is around 11:30 and she is typically asleep at midnight. She does have a TV on in her bedroom which tends to stay on all night. Rise time is around 6:50 AM. She has nocturia  about once per average night and denies recurrent morning headaches. She is not aware of any family history of OSA. She typically takes a nap for an hour around 4 PM daily. She tried a fullface mask in the past for her CPAP machine and it was uncomfortable. She would be willing to get retested and consider CPAP therapy again. She has had some short-term memory issues.  Her Past Medical History Is Significant For: Past Medical History:  Diagnosis Date  . Diabetes mellitus without complication ([place])   . Hypertension   . Substance abuse ([place])     Her Past Surgical History Is Significant For: Past Surgical History:  Procedure Laterality Date  . EYE SURGERY      Her Family History Is Significant For: Family History  Problem Relation Larson of Onset  . Larson Mother   . Diabetes Father   . Larson Brother   . Breast Larson Maternal Aunt     Her Social History Is Significant For: Social History   Socioeconomic History  . Marital status: Married    Spouse name: Not on file  . Number of children: Not on file  . Years of education: Not on file  . Highest education level: Not on file  Occupational History  . Not on file  Social Needs  . Financial resource strain: Not on file  . Food insecurity:    Worry: Not on file    Inability: Not on file  . Transportation needs:    Medical: Not on  file    Non-medical: Not on file  Tobacco Use  . Smoking status: Former Smoker    Years: 30.00    Last attempt to quit: 2004    Years since quitting: 15.9  . Smokeless tobacco: Never Used  Substance and Sexual Activity  . Alcohol use: No    Alcohol/week: 0.0 standard drinks  . Drug use: No  . Sexual activity: Yes  Lifestyle  . Physical activity:    Days per week: Not on file    Minutes per session: Not on file  . Stress: Not on file  Relationships  . Social connections:    Talks on phone: Not on file    Gets together: Not on file    Attends religious service: Not on file    Active  member of club or organization: Not on file    Attends meetings of clubs or organizations: Not on file    Relationship status: Not on file  Other Topics Concern  . Not on file  Social History Narrative  . Not on file    Her Allergies Are:  No Known Allergies:   Her Current Medications Are:  Outpatient Encounter Medications as of 03/09/2018  Medication Sig  . ACCU-CHEK FASTCLIX LANCETS MISC USE TO CHECK BLOOD SUGARS TWICE DAILY  . ACCU-CHEK GUIDE test strip USE TO CHECK BLOOD SUGARS TWICE DAILY AS DIRECTED  . aspirin EC 81 MG tablet Take 81 mg by mouth daily.  . B-D ULTRAFINE III SHORT PEN 31G X 8 MM MISC USE AS DIRECTED TWICE DAILY  . Biotin 10 MG CAPS Take 1 tablet by mouth daily.  . cetirizine (ZYRTEC) 10 MG tablet Take 10 mg by mouth at bedtime.  . cholecalciferol (VITAMIN D) 400 UNITS TABS Take by mouth daily.   Marland Kitchen loratadine (CLARITIN) 10 MG tablet Take 10 mg by mouth daily.  . metFORMIN (GLUCOPHAGE) 500 MG tablet Take 500 mg by mouth 2 (two) times daily with a meal.  . Multiple Vitamins-Minerals (ONE-A-DAY WOMENS 50+ ADVANTAGE PO) Take 1 tablet by mouth daily.  Marland Kitchen omeprazole (PRILOSEC) 40 MG capsule Take 40 mg by mouth daily.  . pioglitazone (ACTOS) 15 MG tablet Take 1 tablet (15 mg total) by mouth daily.  . Pitavastatin Calcium 4 MG TABS Take 1 tablet (4 mg total) by mouth daily.  Marland Kitchen telmisartan-hydrochlorothiazide (MICARDIS HCT) 40-12.5 MG tablet Take 1 tablet by mouth daily.  April Larson 100-3.6 UNIT-MG/ML SOPN INJECT 25 UNITS SUBCUTANEOUS DAILY  . fluticasone (FLONASE) 50 MCG/ACT nasal spray Place 2 sprays into both nostrils daily.   No facility-administered encounter medications on file as of 03/09/2018.   :  Review of Systems:  Out of a complete 14 point review of systems, all are reviewed and negative with the exception of these symptoms as listed below:  Her Past Medical History Is Significant For: Past Medical History:  Diagnosis Date  . Diabetes mellitus without  complication ([place])   . Hypertension   . Substance abuse ([place])     Her Past Surgical History Is Significant For: Past Surgical History:  Procedure Laterality Date  . EYE SURGERY      Her Family History Is Significant For: Family History  Problem Relation Larson of Onset  . Larson Mother   . Diabetes Father   . Larson Brother   . Breast Larson Maternal Aunt     Her Social History Is Significant For: Social History   Socioeconomic History  . Marital status: Married    Spouse name:  Not on file  . Number of children: Not on file  . Years of education: Not on file  . Highest education level: Not on file  Occupational History  . Not on file  Social Needs  . Financial resource strain: Not on file  . Food insecurity:    Worry: Not on file    Inability: Not on file  . Transportation needs:    Medical: Not on file    Non-medical: Not on file  Tobacco Use  . Smoking status: Former Smoker    Years: 30.00    Last attempt to quit: 2004    Years since quitting: 15.9  . Smokeless tobacco: Never Used  Substance and Sexual Activity  . Alcohol use: No    Alcohol/week: 0.0 standard drinks  . Drug use: No  . Sexual activity: Yes  Lifestyle  . Physical activity:    Days per week: Not on file    Minutes per session: Not on file  . Stress: Not on file  Relationships  . Social connections:    Talks on phone: Not on file    Gets together: Not on file    Attends religious service: Not on file    Active member of club or organization: Not on file    Attends meetings of clubs or organizations: Not on file    Relationship status: Not on file  Other Topics Concern  . Not on file  Social History Narrative  . Not on file    Her Allergies Are:  No Known Allergies:   Her Current Medications Are:  Outpatient Encounter Medications as of 03/09/2018  Medication Sig  . ACCU-CHEK FASTCLIX LANCETS MISC USE TO CHECK BLOOD SUGARS TWICE DAILY  . ACCU-CHEK GUIDE test strip USE TO CHECK BLOOD  SUGARS TWICE DAILY AS DIRECTED  . aspirin EC 81 MG tablet Take 81 mg by mouth daily.  . B-D ULTRAFINE III SHORT PEN 31G X 8 MM MISC USE AS DIRECTED TWICE DAILY  . Biotin 10 MG CAPS Take 1 tablet by mouth daily.  . cetirizine (ZYRTEC) 10 MG tablet Take 10 mg by mouth at bedtime.  . cholecalciferol (VITAMIN D) 400 UNITS TABS Take by mouth daily.   Marland Kitchen loratadine (CLARITIN) 10 MG tablet Take 10 mg by mouth daily.  . metFORMIN (GLUCOPHAGE) 500 MG tablet Take 500 mg by mouth 2 (two) times daily with a meal.  . Multiple Vitamins-Minerals (ONE-A-DAY WOMENS 50+ ADVANTAGE PO) Take 1 tablet by mouth daily.  Marland Kitchen omeprazole (PRILOSEC) 40 MG capsule Take 40 mg by mouth daily.  . pioglitazone (ACTOS) 15 MG tablet Take 1 tablet (15 mg total) by mouth daily.  . Pitavastatin Calcium 4 MG TABS Take 1 tablet (4 mg total) by mouth daily.  Marland Kitchen telmisartan-hydrochlorothiazide (MICARDIS HCT) 40-12.5 MG tablet Take 1 tablet by mouth daily.  April Larson 100-3.6 UNIT-MG/ML SOPN INJECT 25 UNITS SUBCUTANEOUS DAILY  . fluticasone (FLONASE) 50 MCG/ACT nasal spray Place 2 sprays into both nostrils daily.   No facility-administered encounter medications on file as of 03/09/2018.   :  Review of Systems:  Out of a complete 14 point review of systems, all are reviewed and negative with the exception of these symptoms as listed below:  Review of Systems  Neurological:       Pt presents today to discuss her sleep. Pt has had a sleep study in the past but had a severe sinus infection after using a cpap.  Epworth Sleepiness Scale 0= would never doze  1= slight chance of dozing 2= moderate chance of dozing 3= high chance of dozing  Sitting and reading: 2 Watching TV: 2 Sitting inactive in a public place (ex. Theater or meeting): 2 As a passenger in a car for an hour without a break: 3 Lying down to rest in the afternoon: 3 Sitting and talking to someone: 1 Sitting quietly after lunch (no alcohol): 2 In a car, while stopped in  traffic: 1 Total: 16     Objective:  Neurological Exam  Physical Exam Physical Examination:   Vitals:   03/09/18 1104  BP: 126/67  Pulse: 93    General Examination: The patient is a very pleasant 67 y.o. female in no acute distress. She appears well-developed and well-nourished and well groomed.   HEENT: Normocephalic, atraumatic, pupils are equal, round and reactive to light and accommodation. EOMI, no nystagmus noted. Normal smooth pursuit is noted. Hearing is grossly intact. Face symmetric with normal facial animation and normal facial sensation. Speech is clear with no dysarthria noted. There is no hypophonia. There is no lip, neck/head, jaw or voice tremor. Neck is supple with full range of passive and active motion. There are no carotid bruits on auscultation. Oropharynx exam reveals: mild mouth dryness,dentures on top and moderately crowded airway secondary to larger uvula, wider tongue, tonsils are 1+. Neck circumference is 16-1/8 inches.  Chest: Clear to auscultation without wheezing, rhonchi or crackles noted.  Heart: S1+S2+0, regular and normal without murmurs, rubs or gallops noted.   Abdomen: Soft, non-tender and non-distended with normal bowel sounds appreciated on auscultation.  Extremities: There is no pitting edema in the distal lower extremities bilaterally. She is wearing knee high compression socks.  Skin: Warm and dry without trophic changes noted.  Musculoskeletal: exam reveals no obvious joint deformities, tenderness or joint swelling or erythema.   Neurologically:  Mental status: The patient is awake, alert and oriented in all 4 spheres. Her immediate and remote memory, attention, language skills and fund of knowledge are appropriate. There is no evidence of aphasia, agnosia, apraxia or anomia. Speech is clear with normal prosody and enunciation. Thought process is linear. Mood is normal and affect is normal.  Cranial nerves II - XII are as described above  under HEENT exam. In addition: shoulder shrug is normal with equal shoulder height noted. Motor exam: Normal bulk, strength and tone is noted. There is no drift, tremor or rebound. Romberg is negative. Reflexes are 2+ throughout. Fine motor skills and coordination: grossly intact.  Cerebellar testing: No dysmetria or intention tremor on finger to nose testing. Heel to shin is unremarkable bilaterally. There is no truncal or gait ataxia.  Sensory exam: intact to light touch.  Gait, station and balance: She stands easily. No veering to one side is noted. No leaning to one side is noted. Posture is Larson-appropriate and stance is narrow based. Gait shows normal stride length and normal pace. No problems turning are noted.   Assessment and Plan:  In summary, April Larson is a very pleasant 67 y.o.-year old female with an underlying medical history of type 2 diabetes, chronic kidney disease, hypertension, seasonal allergies, and obesity, who presents for reevaluation of her prior diagnosis of obstructive sleep apnea (OSA). I had a long chat with the patient about my findings and the diagnosis of OSA, its prognosis and treatment options. We talked about medical treatments, surgical interventions and non-pharmacological approaches. I explained in particular the risks and ramifications of untreated moderate to severe OSA, especially  with respect to developing cardiovascular disease down the Road, including congestive heart failure, difficult to treat hypertension, cardiac arrhythmias, or stroke. Even type 2 diabetes has, in part, been linked to untreated OSA. Symptoms of untreated OSA include daytime sleepiness, memory problems, mood irritability and mood disorder such as depression and anxiety, lack of energy, as well as recurrent headaches, especially morning headaches. We talked about trying to maintain a healthy lifestyle in general, as well as the importance of weight control. I encouraged the  patient to eat healthy, exercise daily and keep well hydrated, to keep a scheduled bedtime and wake time routine, to not skip any meals and eat healthy snacks in between meals. I advised the patient not to drive when feeling sleepy. I recommended the following at this time: sleep study with potential positive airway pressure titration. (We will score hypopneas at 4%).   I explained the sleep test procedure to the patient and also outlined possible surgical and non-surgical treatment options of OSA, including the use of a custom-made dental device (which would require a referral to a specialist dentist or oral surgeon), upper airway surgical options, such as pillar implants, radiofrequency surgery, tongue base surgery, and UPPP (which would involve a referral to an ENT surgeon). Rarely, jaw surgery such as mandibular advancement may be considered.  I also explained the CPAP treatment option to the patient, who indicated that she would be willing to try CPAP if the need arises. I explained the importance of being compliant with PAP treatment, not only for insurance purposes but primarily to improve Her symptoms, and for the patient's long term health benefit, including to reduce Her cardiovascular risks. I answered all her questions today and the patient  was in agreement. I would like to see her back after the sleep study is completed and encouraged her to call with any interim questions, concerns, problems or updates.   Thank you very much for allowing me to participate in the care of this nice patient. If I can be of any further assistance to you please do not hesitate to call me at [phone].  Sincerely,   April Age, MD, [name]

## 2018-03-09 NOTE — Patient Instructions (Signed)

## 2018-03-24 ENCOUNTER — Other Ambulatory Visit: Payer: Self-pay | Admitting: Internal Medicine

## 2018-04-10 ENCOUNTER — Ambulatory Visit (INDEPENDENT_AMBULATORY_CARE_PROVIDER_SITE_OTHER): Payer: Medicare Other | Admitting: Neurology

## 2018-04-10 DIAGNOSIS — G4733 Obstructive sleep apnea (adult) (pediatric): Secondary | ICD-10-CM | POA: Diagnosis not present

## 2018-04-10 DIAGNOSIS — E669 Obesity, unspecified: Secondary | ICD-10-CM

## 2018-04-10 DIAGNOSIS — G4719 Other hypersomnia: Secondary | ICD-10-CM

## 2018-04-10 DIAGNOSIS — R419 Unspecified symptoms and signs involving cognitive functions and awareness: Secondary | ICD-10-CM

## 2018-04-10 DIAGNOSIS — Z789 Other specified health status: Secondary | ICD-10-CM

## 2018-04-10 DIAGNOSIS — R351 Nocturia: Secondary | ICD-10-CM

## 2018-04-10 DIAGNOSIS — G4720 Circadian rhythm sleep disorder, unspecified type: Secondary | ICD-10-CM

## 2018-04-13 ENCOUNTER — Telehealth: Payer: Self-pay | Admitting: Neurology

## 2018-04-13 ENCOUNTER — Telehealth: Payer: Self-pay

## 2018-04-13 NOTE — Progress Notes (Signed)
Patient referred by Dr. Baird Cancer, seen by me on 03/09/18, diagnostic PSG on 04/10/18.   Please call and notify the patient that the recent sleep study showed mild to moderate obstructive sleep apnea. I recommend treatment for this in the form of CPAP. This will require a repeat sleep study for proper titration and mask fitting and correct monitoring of the oxygen saturations, as well as ensure better tolerance, what with her prior intolerance to CPAP in the past. Please explain to patient. I have placed an order in the chart. Thanks.  [name], MD, [name] [facility])

## 2018-04-13 NOTE — Telephone Encounter (Signed)
Pt is asking for a call with the results of the sleep study

## 2018-04-13 NOTE — Telephone Encounter (Signed)
I called pt. I advised pt that Dr. Rexene Alberts reviewed their sleep study results and found that pt has mild to moderate osa and recommends that pt be treated with a cpap. Dr. Rexene Alberts recommends that pt return for a repeat sleep study in order to properly titrate the cpap and ensure a good mask fit. Pt is agreeable to returning for a titration study. I advised pt that our sleep lab will file with pt's insurance and call pt to schedule the sleep study when we hear back from the pt's insurance regarding coverage of this sleep study. Pt verbalized understanding of results. Pt had no questions at this time but was encouraged to call back if questions arise.

## 2018-04-13 NOTE — Addendum Note (Signed)
Addended by: [name] on: 04/13/2018 08:52 AM   Modules accepted: Orders

## 2018-04-13 NOTE — Procedures (Signed)
PATIENT'S NAME:  April Larson, April Larson DOB:      January 13, 1951      MR#:    [id number]     DATE OF RECORDING: 04/10/2018 REFERRING M.D.:  Glendale Chard, MD Study Performed:   Baseline Polysomnogram HISTORY: 68 year old woman with a history of type 2 diabetes, chronic kidney disease, hypertension, seasonal allergies, and obesity, who was previously diagnosed with obstructive sleep apnea. She was placed on CPAP therapy, but could not tolerate it. She has not had re-evaluation in years. Her Epworth sleepiness score is 16 out of 24. The patient's weight 178 pounds with a height of 64 (inches), resulting in a BMI of 30.5 kg/m2. The patient's neck circumference measured 16.2 inches.  CURRENT MEDICATIONS: Aspirin, Biotin, Cetirizine, Vitain D, Loratadine, Metformin, Multi-Vitamin, Omeprazole, Pioglitazone, Pitavastatin, Telmisartan-Hydrochlorothiazide, Xultophy and Fluticasone   PROCEDURE:  This is a multichannel digital polysomnogram utilizing the Somnostar 11.2 system.  Electrodes and sensors were applied and monitored per AASM Specifications.   EEG, EOG, Chin and Limb EMG, were sampled at 200 Hz.  ECG, Snore and Nasal Pressure, Thermal Airflow, Respiratory Effort, CPAP Flow and Pressure, Oximetry was sampled at 50 Hz. Digital video and audio were recorded.      BASELINE STUDY  Lights Out was at 22:15 and Lights On at 05:02.  Total recording time (TRT) was 407 minutes, with a total sleep time (TST) of 332.5 minutes.   The patient's sleep latency to persistent sleep was 33 minutes.  REM latency was 85.5 minutes.  The sleep efficiency was 81.7 %.     SLEEP ARCHITECTURE: WASO (Wake after sleep onset) was 55 minutes with mild to moderate sleep fragmentation noted. There were 11.5 minutes in Stage N1, 254 minutes Stage N2, 22.5 minutes Stage N3 and 44.5 minutes in Stage REM.  The percentage of Stage N1 was 3.5%, Stage N2 was 76.4%, which is increased, Stage N3 was 6.8% and Stage R (REM sleep) was 13.4%,  which is reduced. The arousals were noted as: 39 were spontaneous, 0 were associated with PLMs, 66 were associated with respiratory events.   RESPIRATORY ANALYSIS:  There were a total of 68 respiratory events:  1 obstructive apneas, 0 central apneas and 0 mixed apneas with a total of 1 apneas and an apnea index (AI) of .2 /hour. There were 67 hypopneas with a hypopnea index of 12.1 /hour. The patient also had 0 respiratory event related arousals (RERAs).      The total APNEA/HYPOPNEA INDEX (AHI) was 12.3 /hour and the total RESPIRATORY DISTURBANCE INDEX was 12.3 /hour.  18 events occurred in REM sleep and 98 events in NREM. The REM AHI was 24.3 /hour, versus a non-REM AHI of 10.4. The patient spent 0 minutes of total sleep time in the supine position and 333 minutes in non-supine.. The supine AHI was n/a versus a non-supine AHI of 12.3.  OXYGEN SATURATION & C02:  The Wake baseline 02 saturation was 96%, with the lowest being 88 (53% reported was erroneous). Time spent below 89% saturation equaled 0 minutes.   PERIODIC LIMB MOVEMENTS: The patient had a total of 0 Periodic Limb Movements.  The Periodic Limb Movement (PLM) index was 0 and the PLM Arousal index was 0/hour.  Audio and video analysis did not show any abnormal or unusual movements, behaviors, phonations or vocalizations. The patient took 1 bathroom break. Mild to moderate snoring was noted. The EKG was in keeping with normal sinus rhythm (NSR).  Post-study, the patient indicated that sleep was the same  as usual.   IMPRESSION:  1. Obstructive Sleep Apnea (OSA) 2. Dysfunctions associated with sleep stages or arousals from sleep   RECOMMENDATIONS:  1. This study demonstrates overall mild obstructive sleep apnea, moderate during REM sleep with a total AHI of 12.3/hour, REM AHI of 24.3/hour, and O2 nadir of 88%. Of note, the absence of supine sleep likely underestimates her AHI and O2 nadir. Given the patient's medical history and sleep  related complaints, treatment with positive airway pressure is recommended; a full-night CPAP titration study is advised to optimize therapy and tolerance. Other treatment options may include avoidance of supine sleep position along with weight loss, upper airway or jaw surgery in selected patients or the use of an oral appliance in certain patients. ENT evaluation and/or consultation with a maxillofacial surgeon or dentist may be feasible in some instances.    2. Please note that untreated obstructive sleep apnea may carry additional perioperative morbidity. Patients with significant obstructive sleep apnea (typically, in the moderate to severe degree) should receive, if possible, perioperative PAP (positive airway pressure) therapy and the surgeons and particularly the anesthesiologists should be informed of the diagnosis and the severity of the sleep disordered breathing. If feasible, the patient may be asked to bring his/her CPAP or BiPAP machine for a planned/elective surgery.  3. This study shows sleep fragmentation and abnormal sleep stage percentages; these are nonspecific findings and per se do not signify an intrinsic sleep disorder or a cause for the patient's sleep-related symptoms. Causes include (but are not limited to) the first night effect of the sleep study, circadian rhythm disturbances, medication effect or an underlying mood disorder or medical problem.  4. The patient should be cautioned not to drive, work at heights, or operate dangerous or heavy equipment when tired or sleepy. Review and reiteration of good sleep hygiene measures should be pursued with any patient. 5. The patient will be seen in follow-up in the sleep clinic at [facility] for discussion of the test results, symptom and treatment compliance review, further management strategies, etc. The referring provider will be notified of the test results.  I certify that I have reviewed the entire raw data recording prior to the issuance  of this report in accordance with the Standards of Accreditation of the American Academy of Sleep Medicine (AASM)  [name], MD, [name] Diplomat, American Board of Neurology and Sleep Medicine (Neurology and Sleep Medicine)

## 2018-04-13 NOTE — Telephone Encounter (Signed)
See telephone note from 04/13/18.

## 2018-04-13 NOTE — Telephone Encounter (Signed)
-----   Message from [name], MD sent at 04/13/2018  8:52 AM EST ----- Patient referred by Dr. Baird Cancer, seen by me on 03/09/18, diagnostic PSG on 04/10/18.   Please call and notify the patient that the recent sleep study showed mild to moderate obstructive sleep apnea. I recommend treatment for this in the form of CPAP. This will require a repeat sleep study for proper titration and mask fitting and correct monitoring of the oxygen saturations, as well as ensure better tolerance, what with her prior intolerance to CPAP in the past. Please explain to patient. I have placed an order in the chart. Thanks.  [name], MD, [name] [facility])

## 2018-05-15 ENCOUNTER — Other Ambulatory Visit: Payer: Self-pay | Admitting: Internal Medicine

## 2018-05-15 ENCOUNTER — Encounter: Payer: Self-pay | Admitting: Internal Medicine

## 2018-05-15 ENCOUNTER — Ambulatory Visit (INDEPENDENT_AMBULATORY_CARE_PROVIDER_SITE_OTHER): Payer: Medicare Other | Admitting: Internal Medicine

## 2018-05-15 VITALS — BP 140/82 | HR 84 | Temp 97.6°F | Ht 64.0 in | Wt 176.8 lb

## 2018-05-15 DIAGNOSIS — M25551 Pain in right hip: Secondary | ICD-10-CM | POA: Diagnosis not present

## 2018-05-15 DIAGNOSIS — I129 Hypertensive chronic kidney disease with stage 1 through stage 4 chronic kidney disease, or unspecified chronic kidney disease: Secondary | ICD-10-CM | POA: Diagnosis not present

## 2018-05-15 DIAGNOSIS — Z6831 Body mass index (BMI) 31.0-31.9, adult: Secondary | ICD-10-CM | POA: Insufficient documentation

## 2018-05-15 DIAGNOSIS — E2839 Other primary ovarian failure: Secondary | ICD-10-CM

## 2018-05-15 DIAGNOSIS — Z6830 Body mass index (BMI) 30.0-30.9, adult: Secondary | ICD-10-CM

## 2018-05-15 DIAGNOSIS — E6609 Other obesity due to excess calories: Secondary | ICD-10-CM | POA: Insufficient documentation

## 2018-05-15 DIAGNOSIS — E1122 Type 2 diabetes mellitus with diabetic chronic kidney disease: Secondary | ICD-10-CM | POA: Diagnosis not present

## 2018-05-15 DIAGNOSIS — N182 Chronic kidney disease, stage 2 (mild): Secondary | ICD-10-CM | POA: Diagnosis not present

## 2018-05-15 MED ORDER — MELOXICAM 7.5 MG PO TABS: 7.5000 mg | ORAL_TABLET | Freq: Two times a day (BID) | ORAL | 0 refills | Status: DC | PRN

## 2018-05-15 MED ORDER — TRIAMCINOLONE ACETONIDE 40 MG/ML IJ SUSP
40.0000 mg | Freq: Once | INTRAMUSCULAR | Status: AC
  Administered 2018-05-15: 40 mg via INTRAMUSCULAR

## 2018-05-15 NOTE — Progress Notes (Signed)
Subjective:     Patient ID: April Larson , female    DOB: 1950/10/22 , 68 y.o.   MRN: [id number]   Chief Complaint  Patient presents with  . Diabetes  . Hypertension    HPI  HPI   Past Medical History:  Diagnosis Date  . Diabetes mellitus without complication ([place])   . Hypertension   . Substance abuse ([place])      Family History  Problem Relation Age of Onset  . Cancer Mother   . Diabetes Father   . Cancer Brother   . Breast cancer Maternal Aunt      Current Outpatient Medications:  .  ACCU-CHEK FASTCLIX LANCETS MISC, USE TO CHECK BLOOD SUGARS TWICE DAILY, Disp: 100 each, Rfl: 11 .  ACCU-CHEK GUIDE test strip, USE TO CHECK BLOOD SUGARS TWICE DAILY AS DIRECTED, Disp: 100 each, Rfl: 11 .  aspirin EC 81 MG tablet, Take 81 mg by mouth daily., Disp: , Rfl:  .  B-D ULTRAFINE III SHORT PEN 31G X 8 MM MISC, USE AS DIRECTED TWICE DAILY, Disp: 100 each, Rfl: 11 .  Biotin 10 MG CAPS, Take 1 tablet by mouth daily., Disp: , Rfl:  .  cetirizine (ZYRTEC) 10 MG tablet, Take 10 mg by mouth at bedtime., Disp: , Rfl:  .  cholecalciferol (VITAMIN D) 400 UNITS TABS, Take by mouth daily. , Disp: , Rfl:  .  fluticasone (FLONASE) 50 MCG/ACT nasal spray, Place 2 sprays into both nostrils daily., Disp: 1 g, Rfl: 5 .  loratadine (CLARITIN) 10 MG tablet, Take 10 mg by mouth daily., Disp: , Rfl:  .  metFORMIN (GLUCOPHAGE) 500 MG tablet, Take 500 mg by mouth 2 (two) times daily with a meal., Disp: , Rfl:  .  Multiple Vitamins-Minerals (ONE-A-DAY WOMENS 50+ ADVANTAGE PO), Take 1 tablet by mouth daily., Disp: , Rfl:  .  omeprazole (PRILOSEC) 40 MG capsule, Take 40 mg by mouth daily., Disp: , Rfl:  .  pioglitazone (ACTOS) 15 MG tablet, TAKE 1 TABLET BY MOUTH DAILY, Disp: 90 tablet, Rfl: 1 .  Pitavastatin Calcium 4 MG TABS, Take 1 tablet (4 mg total) by mouth daily., Disp: 90 tablet, Rfl: 1 .  telmisartan-hydrochlorothiazide (MICARDIS HCT) 40-12.5 MG tablet, Take 1 tablet by mouth daily.,  Disp: 90 tablet, Rfl: 1 .  Turmeric 500 MG CAPS, Take 1 capsule by mouth., Disp: , Rfl:  .  XULTOPHY 100-3.6 UNIT-MG/ML SOPN, INJECT 25 UNITS SUBCUTANEOUS DAILY, Disp: 9 pen, Rfl: 2 .  meloxicam (MOBIC) 7.5 MG tablet, Take 1 tablet (7.5 mg total) by mouth 2 (two) times daily as needed for pain., Disp: 60 tablet, Rfl: 0   No Known Allergies   Review of Systems  Constitutional: Negative.   Respiratory: Negative.   Cardiovascular: Negative.   Gastrointestinal: Negative.   Genitourinary: Vaginal pain: she c/o R hip pain. denies fall/trauma. exacerbated by lying on her right side.   Musculoskeletal: Positive for arthralgias.  Neurological: Negative.   Psychiatric/Behavioral: Negative.      Today's Vitals   05/15/18 1445 05/15/18 1538  BP: (!) 154/78 140/82  Pulse: 84   Temp: 97.6 F (36.4 C)   TempSrc: Oral   Weight: 176 lb 12.8 oz (80.2 kg)   Height: '5\' 4"'  (1.626 m)   PainSc: 0-No pain    Body mass index is 30.35 kg/m.   Objective:  Physical Exam Vitals signs and nursing note reviewed.  Constitutional:      Appearance: Normal appearance.  HENT:  Head: Normocephalic and atraumatic.  Cardiovascular:     Rate and Rhythm: Normal rate and regular rhythm.     Pulses: Normal pulses.     Heart sounds: Normal heart sounds.  Musculoskeletal:        General: Tenderness present.     Comments: Tenderness r lateral hip  Skin:    General: Skin is warm.  Neurological:     General: No focal deficit present.     Mental Status: She is alert.         Assessment And Plan:     1. Diabetes mellitus with stage 2 chronic kidney disease ([place])  Importance of dietary compliance was discussed with the patient. She is encouraged to implement more exercise into her daily routine. She is to aim for 150 minutes per week.   - CMP14+EGFR - Hemoglobin A1c  2. Hypertensive nephropathy  Fair control. Elevated likely related to her hip pain. She will continue with current meds and  encouraged to keep up the great work.   3. Right hip pain  Her sx are suggestive of bursitis. She was given kenalog injection and rx meloxicam to use as needed. She will let me know if her sympotms persist.   - triamcinolone acetonide (KENALOG-40) injection 40 mg  4. Estrogen deficiency  [facility]. I will try to schedule this with her next scheduled mammogram in April/May 2020.  - HM DEXA SCAN  5. Class 1 obesity due to excess calories with serious comorbidity and body mass index (BMI) of 30.0 to 30.9 in adult  She is encouraged to initially strive for BMI less than 28 to decrease cardiac risk. She is advised to exercise no less than 150 minutes per week.    Maximino Greenland, MD

## 2018-05-16 LAB — CMP14+EGFR
A/G RATIO: 1.4 (ref 1.2–2.2)
ALT: 13 IU/L (ref 0–32)
AST: 12 IU/L (ref 0–40)
Albumin: 4.4 g/dL (ref 3.8–4.8)
Alkaline Phosphatase: 106 IU/L (ref 39–117)
BUN/Creatinine Ratio: 21 (ref 12–28)
BUN: 17 mg/dL (ref 8–27)
Bilirubin Total: <0.2 mg/dL (ref 0.0–1.2)
CO2: 26 mmol/L (ref 20–29)
Calcium: 10.2 mg/dL (ref 8.7–10.3)
Chloride: 99 mmol/L (ref 96–106)
Creatinine, Ser: 0.81 mg/dL (ref 0.57–1.00)
GFR calc Af Amer: 88 mL/min/{1.73_m2} (ref >59)
GFR calc non Af Amer: 76 mL/min/{1.73_m2} (ref >59)
GLOBULIN, TOTAL: 3.1 g/dL (ref 1.5–4.5)
Glucose: 66 mg/dL (ref 65–99)
POTASSIUM: 4.5 mmol/L (ref 3.5–5.2)
Sodium: 139 mmol/L (ref 134–144)
Total Protein: 7.5 g/dL (ref 6.0–8.5)

## 2018-05-16 LAB — HEMOGLOBIN A1C
Est. average glucose Bld gHb Est-mCnc: 126 mg/dL
Hgb A1c MFr Bld: 6 % — ABNORMAL HIGH (ref 4.8–5.6)

## 2018-05-16 NOTE — Telephone Encounter (Signed)
Meloxicam refilll

## 2018-06-05 ENCOUNTER — Ambulatory Visit (INDEPENDENT_AMBULATORY_CARE_PROVIDER_SITE_OTHER): Payer: Medicare Other | Admitting: Neurology

## 2018-06-05 DIAGNOSIS — Z789 Other specified health status: Secondary | ICD-10-CM

## 2018-06-05 DIAGNOSIS — R419 Unspecified symptoms and signs involving cognitive functions and awareness: Secondary | ICD-10-CM

## 2018-06-05 DIAGNOSIS — G4733 Obstructive sleep apnea (adult) (pediatric): Secondary | ICD-10-CM

## 2018-06-05 DIAGNOSIS — G4719 Other hypersomnia: Secondary | ICD-10-CM

## 2018-06-05 DIAGNOSIS — G4720 Circadian rhythm sleep disorder, unspecified type: Secondary | ICD-10-CM

## 2018-06-05 DIAGNOSIS — E669 Obesity, unspecified: Secondary | ICD-10-CM

## 2018-06-05 DIAGNOSIS — R351 Nocturia: Secondary | ICD-10-CM

## 2018-06-06 ENCOUNTER — Telehealth: Payer: Self-pay

## 2018-06-06 NOTE — Addendum Note (Signed)
Addended by: [name] on: 06/06/2018 08:06 AM   Modules accepted: Orders

## 2018-06-06 NOTE — Telephone Encounter (Signed)
I called pt. I advised pt that Dr. Rexene Alberts reviewed their sleep study results and found that pt did fairly well with the cpap during her latest sleep study. Dr. Rexene Alberts recommends that pt start a cpap at home. I reviewed PAP compliance expectations with the pt. Pt is agreeable to starting a CPAP. I advised pt that an order will be sent to a DME, [employer], and Huey Romans will call the pt within about one week after they file with the pt's insurance. Huey Romans will show the pt how to use the machine, fit for masks, and troubleshoot the CPAP if needed. A follow up appt was made for insurance purposes with Jinny Blossom, NP on 09/14/2018 at 2:30pm. Pt verbalized understanding to arrive 15 minutes early and bring their CPAP. A letter with all of this information in it will be mailed to the pt as a reminder. I verified with the pt that the address we have on file is correct. Pt verbalized understanding of results. Pt had no questions at this time but was encouraged to call back if questions arise. I have sent the order to [facility] and have received confirmation that they have received the order.

## 2018-06-06 NOTE — Progress Notes (Signed)
Patient referred by Dr. Baird Cancer, seen by me on 03/09/18, diagnostic PSG on 04/10/18. Patient had a CPAP titration study on 06/05/18.  Please call and inform patient that I have entered an order for treatment with positive airway pressure (PAP) treatment for obstructive sleep apnea (OSA). She did fairly during the latest sleep study with CPAP. We will arrange for a machine for home use through a DME (durable medical equipment) company of Her choice; and I will see the patient back in follow-up in about 10 weeks. Please also explain to the patient that I will be looking out for compliance data, which can be downloaded from the machine (stored on an SD card, that is inserted in the machine) or via remote access through a modem, that is built into the machine. At the time of the followup appointment we will discuss sleep study results and how it is going with PAP treatment at home. Please advise patient to bring Her machine at the time of the first FU visit, even though this is cumbersome. Bringing the machine for every visit after that will likely not be needed, but often helps for the first visit to troubleshoot if needed. Please re-enforce the importance of compliance with treatment and the need for [place] to monitor compliance data - often an insurance requirement and actually good feedback for the patient as far as how they are doing.  Also remind patient, that any interim PAP machine or mask issues should be first addressed with the DME company, as they can often help better with technical and mask fit issues. Please ask if patient has a preference regarding DME company.  Please also make sure, the patient has a follow-up appointment with me in about 10 weeks from the setup date, thanks. May see one of our nurse practitioners if needed for proper timing of the FU appointment.  Please fax or rout report to the referring provider. Thanks,   [name], MD, [name] [facility])

## 2018-06-06 NOTE — Telephone Encounter (Signed)
-----   Message from [name], MD sent at 06/06/2018  8:06 AM EST ----- Patient referred by Dr. Baird Cancer, seen by me on 03/09/18, diagnostic PSG on 04/10/18. Patient had a CPAP titration study on 06/05/18.  Please call and inform patient that I have entered an order for treatment with positive airway pressure (PAP) treatment for obstructive sleep apnea (OSA). She did fairly during the latest sleep study with CPAP. We will arrange for a machine for home use through a DME (durable medical equipment) company of Her choice; and I will see the patient back in follow-up in about 10 weeks. Please also explain to the patient that I will be looking out for compliance data, which can be downloaded from the machine (stored on an SD card, that is inserted in the machine) or via remote access through a modem, that is built into the machine. At the time of the followup appointment we will discuss sleep study results and how it is going with PAP treatment at home. Please advise patient to bring Her machine at the time of the first FU visit, even though this is cumbersome. Bringing the machine for every visit after that will likely not be needed, but often helps for the first visit to troubleshoot if needed. Please re-enforce the importance of compliance with treatment and the need for [place] to monitor compliance data - often an insurance requirement and actually good feedback for the patient as far as how they are doing.  Also remind patient, that any interim PAP machine or mask issues should be first addressed with the DME company, as they can often help better with technical and mask fit issues. Please ask if patient has a preference regarding DME company.  Please also make sure, the patient has a follow-up appointment with me in about 10 weeks from the setup date, thanks. May see one of our nurse practitioners if needed for proper timing of the FU appointment.  Please fax or rout report to the referring provider. Thanks,   [name], MD, [name] [facility])

## 2018-06-06 NOTE — Procedures (Signed)
PATIENT'S NAME:  April Larson, [name] DOB:      29-Oct-1950      MR#:    [id number]     DATE OF RECORDING: 06/05/2018 REFERRING M.D.:  Glendale Chard MD Study Performed:   CPAP  Titration HISTORY: 68 year old woman with a history of type 2 diabetes, chronic kidney disease, hypertension, seasonal allergies, and obesity, who presents for a full night titration study. Her baseline sleep study from 04/10/18 showed a total AHI of 12.3/hour, REM AHI of 24.3/hour, and O2 nadir of 88%. She had absence of supine sleep. The patient endorsed the Epworth Sleepiness Scale at 16/24 points. The patient's weight 178 pounds with a height of 64 (inches), resulting in a BMI of 30.5 kg/m2.   CURRENT MEDICATIONS: Aspirin, Biotin, Cetirizine, Vitain D, Loratadine, Metformin, Multi-Vitamin, Omeprazole, Pioglitazone, Pitavastatin, Telmisartan-Hydrochlorothiazide, Xultophy and Fluticasone  PROCEDURE:  This is a multichannel digital polysomnogram utilizing the SomnoStar 11.2 system.  Electrodes and sensors were applied and monitored per AASM Specifications.   EEG, EOG, Chin and Limb EMG, were sampled at 200 Hz.  ECG, Snore and Nasal Pressure, Thermal Airflow, Respiratory Effort, CPAP Flow and Pressure, Oximetry was sampled at 50 Hz. Digital video and audio were recorded.      The patient was fitted with a small Quattro FFM. CPAP was initiated at 5 cmH20 with heated humidity per AASM standards and pressure was advanced to 9 cmH20 because of hypopneas, apneas and desaturations.  At a PAP pressure of 9 cmH20, there was a reduction of the AHI to 1.2/hour with supine NREM sleep achieved and O2 nadir of 95%.   Lights Out was at 22:38 and Lights On at 05:01. Total recording time (TRT) was 383 minutes, with a total sleep time (TST) of 253.5 minutes. The patient's sleep latency was 71.5 minutes, which is delayed, REM sleep was absent. The sleep efficiency was 66.2%, which is reduced.    SLEEP ARCHITECTURE: WASO (Wake after sleep  onset) was 101.5 minutes with moderate to severe sleep fragmentation noted. There were 30 minutes in Stage N1, 168 minutes Stage N2, 55.5 minutes Stage N3 and 0 minutes in Stage REM.  The percentage of Stage N1 was 11.8%, which is increased, Stage N2 was 66.3%, which is increased, Stage N3 was 21.9% and Stage R (REM sleep) was absent. The arousals were noted as: 41 were spontaneous, 0 were associated with PLMs, 2 were associated with respiratory events.  RESPIRATORY ANALYSIS:  There was a total of 17 respiratory events: 3 obstructive apneas, 5 central apneas and 0 mixed apneas with a total of 8 apneas and an apnea index (AI) of 1.9 /hour. There were 9 hypopneas with a hypopnea index of 2.1/hour. The patient also had 0 respiratory event related arousals (RERAs).      The total APNEA/HYPOPNEA INDEX  (AHI) was 4. /hour and the total RESPIRATORY DISTURBANCE INDEX was 4. /hour  0 events occurred in REM sleep and 17 events in NREM. The REM AHI was 0 /hour versus a non-REM AHI of 4. /hour.  The patient spent 90 minutes of total sleep time in the supine position and 164 minutes in non-supine. The supine AHI was 8.7, versus a non-supine AHI of 1.5.  OXYGEN SATURATION & C02:  The baseline 02 saturation was 98%, with the lowest being 91%. Time spent below 89% saturation equaled 0 minutes.  PERIODIC LIMB MOVEMENTS:  The patient had a total of 0 Periodic Limb Movements. The Periodic Limb Movement (PLM) index was 0 and the PLM  Arousal index was 0 /hour.  Audio and video analysis did not show any abnormal or unusual movements, behaviors, phonations or vocalizations. The patient took 1 bathroom break. The EKG was in keeping with normal sinus rhythm (NSR).  Post-study, the patient indicated that sleep was worse than usual.   IMPRESSION:   1. Obstructive Sleep Apnea (OSA) 2. Dysfunctions associated with sleep stages or arousals from sleep    RECOMMENDATIONS:   1. This study demonstrates improvement of the  patient's obstructive sleep apnea with CPAP therapy. The study was limited by poor sleep consolidation and absence of REM sleep. Nevertheless, I recommend a home CPAP treatment pressure of 9 cm via small FFM. The patient should be reminded to be fully compliant with PAP therapy to improve sleep related symptoms and decrease long term cardiovascular risks. The patient should be reminded, that it may take up to 3 months to get fully used to using PAP with all planned sleep. The earlier full compliance is achieved, the better long term compliance tends to be. Please note that untreated obstructive sleep apnea may carry additional perioperative morbidity. Patients with significant obstructive sleep apnea should receive perioperative PAP therapy and the surgeons and particularly the anesthesiologist should be informed of the diagnosis and the severity of the sleep disordered breathing. 2. This study shows sleep fragmentation and abnormal sleep stage percentages; these are nonspecific findings and per se do not signify an intrinsic sleep disorder or a cause for the patient's sleep-related symptoms. Causes include (but are not limited to) the first night effect of the sleep study, circadian rhythm disturbances, medication effect or an underlying mood disorder or medical problem.  3. The patient should be cautioned not to drive, work at heights, or operate dangerous or heavy equipment when tired or sleepy. Review and reiteration of good sleep hygiene measures should be pursued with any patient. 4. The patient will be seen in follow-up in the sleep clinic at [facility] for discussion of the test results, symptom and treatment compliance review, further management strategies, etc. The referring provider will be notified of the test results.   I certify that I have reviewed the entire raw data recording prior to the issuance of this report in accordance with the Standards of Accreditation of the American Academy of Sleep  Medicine (AASM)    [name], MD, [name] Diplomat, American Board of Neurology and Sleep Medicine (Neurology and Sleep Medicine)

## 2018-06-19 ENCOUNTER — Other Ambulatory Visit: Payer: Self-pay | Admitting: Internal Medicine

## 2018-06-19 NOTE — Telephone Encounter (Signed)
meloxicam refill

## 2018-06-22 ENCOUNTER — Other Ambulatory Visit: Payer: Self-pay | Admitting: Nurse Practitioner

## 2018-06-28 DIAGNOSIS — G4733 Obstructive sleep apnea (adult) (pediatric): Secondary | ICD-10-CM | POA: Diagnosis not present

## 2018-07-09 ENCOUNTER — Other Ambulatory Visit: Payer: Self-pay | Admitting: Internal Medicine

## 2018-07-14 ENCOUNTER — Other Ambulatory Visit: Payer: Self-pay | Admitting: Internal Medicine

## 2018-07-24 ENCOUNTER — Telehealth: Payer: Self-pay | Admitting: Internal Medicine

## 2018-07-24 NOTE — Telephone Encounter (Signed)
I spoke with the patient to schedule her virtual AWV with [place].  She wanted me to let you know that she fell at home (she missed the chair).  I asked her if she felt like she needed to be seen in the office for it.  She said no because it's getting better, but she just wanted you to know. VDM (DD)

## 2018-07-24 NOTE — Telephone Encounter (Signed)
Thanks for update

## 2018-07-26 ENCOUNTER — Other Ambulatory Visit: Payer: Self-pay

## 2018-07-26 ENCOUNTER — Ambulatory Visit (INDEPENDENT_AMBULATORY_CARE_PROVIDER_SITE_OTHER): Payer: Medicare Other

## 2018-07-26 ENCOUNTER — Encounter: Payer: Self-pay | Admitting: Internal Medicine

## 2018-07-26 VITALS — Ht 64.0 in | Wt 180.0 lb

## 2018-07-26 DIAGNOSIS — Z23 Encounter for immunization: Secondary | ICD-10-CM

## 2018-07-26 DIAGNOSIS — Z0000 Encounter for general adult medical examination without abnormal findings: Secondary | ICD-10-CM

## 2018-07-26 MED ORDER — PNEUMOCOCCAL 13-VAL CONJ VACC IM SUSP: 0.5000 mL | INTRAMUSCULAR | 0 refills | Status: AC

## 2018-07-26 NOTE — Patient Instructions (Signed)
Ms. April Larson , Thank you for taking time to come for your Medicare Wellness Visit. I appreciate your ongoing commitment to your health goals. Please review the following plan we discussed and let me know if I can assist you in the future.   Screening recommendations/referrals: Colonoscopy: 09/2014 Mammogram: 10/2017 Bone Density: 05/2018 Recommended yearly ophthalmology/optometry visit for glaucoma screening and checkup Recommended yearly dental visit for hygiene and checkup  Vaccinations: Influenza vaccine: 01/2018 Pneumococcal vaccine: 02/2017 Tdap vaccine: 09/2015 Shingles vaccine: discussed    Advanced directives: Advance directive discussed with you today. Even though you declined this today please call our office should you change your mind and we can give you the proper paperwork for you to fill out.   Conditions/risks identified: Obesity  Next appointment: 10/19/2018 at 3:00   Preventive Care 65 Years and Older, Female Preventive care refers to lifestyle choices and visits with your health care provider that can promote health and wellness. What does preventive care include?  A yearly physical exam. This is also called an annual well check.  Dental exams once or twice a year.  Routine eye exams. Ask your health care provider how often you should have your eyes checked.  Personal lifestyle choices, including:  Daily care of your teeth and gums.  Regular physical activity.  Eating a healthy diet.  Avoiding tobacco and drug use.  Limiting alcohol use.  Practicing safe sex.  Taking low-dose aspirin every day.  Taking vitamin and mineral supplements as recommended by your health care provider. What happens during an annual well check? The services and screenings done by your health care provider during your annual well check will depend on your age, overall health, lifestyle risk factors, and family history of disease. Counseling  Your health care provider  may ask you questions about your:  Alcohol use.  Tobacco use.  Drug use.  Emotional well-being.  Home and relationship well-being.  Sexual activity.  Eating habits.  History of falls.  Memory and ability to understand (cognition).  Work and work Statistician.  Reproductive health. Screening  You may have the following tests or measurements:  Height, weight, and BMI.  Blood pressure.  Lipid and cholesterol levels. These may be checked every 5 years, or more frequently if you are over 64 years old.  Skin check.  Lung cancer screening. You may have this screening every year starting at age 79 if you have a 30-pack-year history of smoking and currently smoke or have quit within the past 15 years.  Fecal occult blood test (FOBT) of the stool. You may have this test every year starting at age 34.  Flexible sigmoidoscopy or colonoscopy. You may have a sigmoidoscopy every 5 years or a colonoscopy every 10 years starting at age 41.  Hepatitis C blood test.  Hepatitis B blood test.  Sexually transmitted disease (STD) testing.  Diabetes screening. This is done by checking your blood sugar (glucose) after you have not eaten for a while (fasting). You may have this done every 1-3 years.  Bone density scan. This is done to screen for osteoporosis. You may have this done starting at age 70.  Mammogram. This may be done every 1-2 years. Talk to your health care provider about how often you should have regular mammograms. Talk with your health care provider about your test results, treatment options, and if necessary, the need for more tests. Vaccines  Your health care provider may recommend certain vaccines, such as:  Influenza vaccine. This is recommended  every year.  Tetanus, diphtheria, and acellular pertussis (Tdap, Td) vaccine. You may need a Td booster every 10 years.  Zoster vaccine. You may need this after age 43.  Pneumococcal 13-valent conjugate (PCV13) vaccine.  One dose is recommended after age 83.  Pneumococcal polysaccharide (PPSV23) vaccine. One dose is recommended after age 39. Talk to your health care provider about which screenings and vaccines you need and how often you need them. This information is not intended to replace advice given to you by your health care provider. Make sure you discuss any questions you have with your health care provider. Document Released: 04/18/2015 Document Revised: 12/10/2015 Document Reviewed: 01/21/2015 Elsevier Interactive Patient Education  2017 [employer] Prevention in the Home Falls can cause injuries. They can happen to people of all ages. There are many things you can do to make your home safe and to help prevent falls. What can I do on the outside of my home?  Regularly fix the edges of walkways and driveways and fix any cracks.  Remove anything that might make you trip as you walk through a door, such as a raised step or threshold.  Trim any bushes or trees on the path to your home.  Use bright outdoor lighting.  Clear any walking paths of anything that might make someone trip, such as rocks or tools.  Regularly check to see if handrails are loose or broken. Make sure that both sides of any steps have handrails.  Any raised decks and porches should have guardrails on the edges.  Have any leaves, snow, or ice cleared regularly.  Use sand or salt on walking paths during winter.  Clean up any spills in your garage right away. This includes oil or grease spills. What can I do in the bathroom?  Use night lights.  Install grab bars by the toilet and in the tub and shower. Do not use towel bars as grab bars.  Use non-skid mats or decals in the tub or shower.  If you need to sit down in the shower, use a plastic, non-slip stool.  Keep the floor dry. Clean up any water that spills on the floor as soon as it happens.  Remove soap buildup in the tub or shower regularly.  Attach bath  mats securely with double-sided non-slip rug tape.  Do not have throw rugs and other things on the floor that can make you trip. What can I do in the bedroom?  Use night lights.  Make sure that you have a light by your bed that is easy to reach.  Do not use any sheets or blankets that are too big for your bed. They should not hang down onto the floor.  Have a firm chair that has side arms. You can use this for support while you get dressed.  Do not have throw rugs and other things on the floor that can make you trip. What can I do in the kitchen?  Clean up any spills right away.  Avoid walking on wet floors.  Keep items that you use a lot in easy-to-reach places.  If you need to reach something above you, use a strong step stool that has a grab bar.  Keep electrical cords out of the way.  Do not use floor polish or wax that makes floors slippery. If you must use wax, use non-skid floor wax.  Do not have throw rugs and other things on the floor that can make you trip. What  can I do with my stairs?  Do not leave any items on the stairs.  Make sure that there are handrails on both sides of the stairs and use them. Fix handrails that are broken or loose. Make sure that handrails are as long as the stairways.  Check any carpeting to make sure that it is firmly attached to the stairs. Fix any carpet that is loose or worn.  Avoid having throw rugs at the top or bottom of the stairs. If you do have throw rugs, attach them to the floor with carpet tape.  Make sure that you have a light switch at the top of the stairs and the bottom of the stairs. If you do not have them, ask someone to add them for you. What else can I do to help prevent falls?  Wear shoes that:  Do not have high heels.  Have rubber bottoms.  Are comfortable and fit you well.  Are closed at the toe. Do not wear sandals.  If you use a stepladder:  Make sure that it is fully opened. Do not climb a closed  stepladder.  Make sure that both sides of the stepladder are locked into place.  Ask someone to hold it for you, if possible.  Clearly mark and make sure that you can see:  Any grab bars or handrails.  First and last steps.  Where the edge of each step is.  Use tools that help you move around (mobility aids) if they are needed. These include:  Canes.  Walkers.  Scooters.  Crutches.  Turn on the lights when you go into a dark area. Replace any light bulbs as soon as they burn out.  Set up your furniture so you have a clear path. Avoid moving your furniture around.  If any of your floors are uneven, fix them.  If there are any pets around you, be aware of where they are.  Review your medicines with your doctor. Some medicines can make you feel dizzy. This can increase your chance of falling. Ask your doctor what other things that you can do to help prevent falls. This information is not intended to replace advice given to you by your health care provider. Make sure you discuss any questions you have with your health care provider. Document Released: 01/16/2009 Document Revised: 08/28/2015 Document Reviewed: 04/26/2014 Elsevier Interactive Patient Education  2017 [employer].

## 2018-07-26 NOTE — Progress Notes (Signed)
Subjective:   April Larson is a 68 y.o. female who presents for Medicare Annual (Subsequent) preventive examination.  This visit type was conducted due to national recommendations for restrictions regarding the COVID-19 Pandemic (e.g. social distancing). This format is felt to be most appropriate for this patient at this time. All issues noted in this document were discussed and addressed. No physical exam was performed (except for noted visual exam findings with Video Visits). The patient, April Larson, has given consent to perform this visit via video. Vital signs may be absent or patient reported.   Patient location: at home   Nurse location:  [place] office   Review of Systems:  n/a Cardiac Risk Factors include: advanced age (>57men, >26 women);diabetes mellitus;hypertension;obesity (BMI >30kg/m2)     Objective:     Vitals: Ht 5\' 4"  (1.626 m) Comment: patient reports  Wt 180 lb (81.6 kg) Comment: patient reports  BMI 30.90 kg/m   Body mass index is 30.9 kg/m.  Advanced Directives 07/26/2018 04/05/2016 09/18/2015  Does Patient Have a Medical Advance Directive? No No No  Would patient like information on creating a medical advance directive? No - Patient declined - -    Tobacco Social History   Tobacco Use  Smoking Status Former Smoker  . Years: 30.00  . Last attempt to quit: 2004  . Years since quitting: 16.3  Smokeless Tobacco Never Used     Counseling given: Not Answered   Clinical Intake:  Pre-visit preparation completed: Yes  Pain : No/denies pain     Nutritional Status: BMI > 30  Obese Nutritional Risks: None Diabetes: Yes CBG done?: No  How often do you need to have someone help you when you read instructions, pamphlets, or other written materials from your doctor or pharmacy?: 1 - Never What is the last grade level you completed in school?: BS degree  Interpreter Needed?: No  Information entered by :: NAllen LPN   Past Medical History:  Diagnosis Date  . Diabetes mellitus without complication ([place])   . Hypertension   . Substance abuse [facility])    Past Surgical History:  Procedure Laterality Date  . EYE SURGERY     Family History  Problem Relation Age of Onset  . Cancer Mother   . Diabetes Father   . Cancer Brother   . Breast cancer Maternal Aunt    Social History   Socioeconomic History  . Marital status: Widowed    Spouse name: Not on file  . Number of children: Not on file  . Years of education: Not on file  . Highest education level: Not on file  Occupational History  . Occupation: retired  Scientific laboratory technician  . Financial resource strain: Not hard at all  . Food insecurity:    Worry: Never true    Inability: Never true  . Transportation needs:    Medical: Yes    Non-medical: Yes  Tobacco Use  . Smoking status: Former Smoker    Years: 30.00    Last attempt to quit: 2004    Years since quitting: 16.3  . Smokeless tobacco: Never Used  Substance and Sexual Activity  . Alcohol use: No    Alcohol/week: 0.0 standard drinks  . Drug use: No  . Sexual activity: Not Currently  Lifestyle  . Physical activity:    Days per week: 7 days    Minutes per session: 30 min  . Stress: Not at all  Relationships  . Social connections:  Talks on phone: Not on file    Gets together: Not on file    Attends religious service: Not on file    Active member of club or organization: Not on file    Attends meetings of clubs or organizations: Not on file    Relationship status: Not on file  Other Topics Concern  . Not on file  Social History Narrative  . Not on file    Outpatient Encounter Medications as of 07/26/2018  Medication Sig  . ACCU-CHEK FASTCLIX LANCETS MISC USE TO CHECK BLOOD SUGARS TWICE DAILY  . ACCU-CHEK GUIDE test strip USE TO CHECK BLOOD SUGARS TWICE DAILY AS DIRECTED  . aspirin EC 81 MG tablet Take 81 mg by mouth daily.  . B-D ULTRAFINE III SHORT PEN 31G X 8 MM MISC USE AS  DIRECTED TWICE DAILY  . Biotin 10 MG CAPS Take 1 tablet by mouth daily.  . cetirizine (ZYRTEC) 10 MG tablet Take 10 mg by mouth at bedtime.  . cholecalciferol (VITAMIN D) 400 UNITS TABS Take by mouth daily.   . fluticasone (FLONASE) 50 MCG/ACT nasal spray SHAKE LIQUID AND USE 2 SPRAYS IN EACH NOSTRIL DAILY  . LIVALO 4 MG TABS TAKE 1 TABLET BY MOUTH DAILY  . loratadine (CLARITIN) 10 MG tablet Take 10 mg by mouth daily.  . meloxicam (MOBIC) 7.5 MG tablet TAKE 1 TABLET(7.5 MG) BY MOUTH TWICE DAILY AS NEEDED FOR PAIN  . metFORMIN (GLUCOPHAGE) 500 MG tablet Take 500 mg by mouth 2 (two) times daily with a meal.  . Multiple Vitamins-Minerals (ONE-A-DAY WOMENS 50+ ADVANTAGE PO) Take 1 tablet by mouth daily.  Marland Kitchen omeprazole (PRILOSEC) 40 MG capsule TAKE 1 CAPSULE BY MOUTH EVERY DAY BEFORE A MEAL  . pioglitazone (ACTOS) 15 MG tablet TAKE 1 TABLET BY MOUTH DAILY  . telmisartan-hydrochlorothiazide (MICARDIS HCT) 40-12.5 MG tablet TAKE 1 TABLET BY MOUTH EVERY DAY  . Turmeric 500 MG CAPS Take 1 capsule by mouth.  [name] 100-3.6 UNIT-MG/ML SOPN INJECT 25 UNITS SUBCUTANEOUS DAILY  . pneumococcal 13-valent conjugate vaccine (PREVNAR 13) SUSP injection Inject 0.5 mLs into the muscle tomorrow at 10 am for 1 dose.   No facility-administered encounter medications on file as of 07/26/2018.     Activities of Daily Living In your present state of health, do you have any difficulty performing the following activities: 07/26/2018  Hearing? N  Vision? N  Difficulty concentrating or making decisions? N  Walking or climbing stairs? N  Dressing or bathing? N  Doing errands, shopping? N  Preparing Food and eating ? N  Using the Toilet? N  In the past six months, have you accidently leaked urine? N  Do you have problems with loss of bowel control? N  Managing your Medications? N  Managing your Finances? N  Housekeeping or managing your Housekeeping? N  Some recent data might be hidden    Patient Care Team:  Glendale Chard, MD as PCP - General (Internal Medicine) Warden Fillers, MD as Consulting Physician (Ophthalmology)    Assessment:   This is a routine wellness examination for April Larson.  Exercise Activities and Dietary recommendations Current Exercise Habits: Home exercise routine, Type of exercise: walking, Time (Minutes): 30, Frequency (Times/Week): 7, Weekly Exercise (Minutes/Week): 210, Intensity: Mild  Goals    . Weight (lb) < 200 lb (90.7 kg) (pt-stated)     Wants to get 170 pounds       Fall Risk Fall Risk  07/26/2018 01/12/2018 10/25/2017  Falls in the past year?  1 No No  Number falls in past yr: 0 - -  Comment missed chair - -  Injury with Fall? 1 - -  Comment hurt right shoulder - -  Risk for fall due to : History of fall(s);Medication side effect - -  Follow up Falls prevention discussed - -   Is the patient's home free of loose throw rugs in walkways, pet beds, electrical cords, etc?   yes      Grab bars in the bathroom? no      Handrails on the stairs?   yes      Adequate lighting?   yes  Timed Get Up and Go performed: n/a  Depression Screen PHQ 2/9 Scores 07/26/2018 01/12/2018 10/25/2017  PHQ - 2 Score 0 0 1  PHQ- 9 Score 0 - -     Cognitive Function     6CIT Screen 07/26/2018  What Year? 0 points  What month? 0 points  What time? 0 points  Count back from 20 0 points  Months in reverse 0 points  Repeat phrase 0 points  Total Score 0    Immunization History  Administered Date(s) Administered  . Influenza, High Dose Seasonal PF 01/12/2018  . Tdap 09/18/2015    Qualifies for Shingles Vaccine? yes  Screening Tests Health Maintenance  Topic Date Due  . FOOT EXAM  12/13/1960  . OPHTHALMOLOGY EXAM  [birth year]13/1960  . PNA vac Low Risk Adult (2 of 2 - PPSV23) 03/03/2018  . INFLUENZA VACCINE  11/04/2018  . HEMOGLOBIN A1C  11/13/2018  . MAMMOGRAM  10/18/2019  . COLONOSCOPY  09/17/2024  . TETANUS/TDAP  09/17/2025  . DEXA SCAN  Completed  . Hepatitis  C Screening  Completed    Cancer Screenings: Lung: Low Dose CT Chest recommended if Age 67-80 years, 30 pack-year currently smoking OR have quit w/in 15years. Patient does not qualify. Breast:  Up to date on Mammogram? Yes   Up to date of Bone Density/Dexa? Yes Colorectal: up to date  Additional Screenings: : Hepatitis C Screening: 08/11/2012 <0.1     Plan:   Wants to get to 170 pounds.  I have personally reviewed and noted the following in the patient's chart:   . Medical and social history . Use of alcohol, tobacco or illicit drugs  . Current medications and supplements . Functional ability and status . Nutritional status . Physical activity . Advanced directives . List of other physicians . Hospitalizations, surgeries, and ER visits in previous 12 months . Vitals . Screenings to include cognitive, depression, and falls . Referrals and appointments  In addition, I have reviewed and discussed with patient certain preventive protocols, quality metrics, and best practice recommendations. A written personalized care plan for preventive services as well as general preventive health recommendations were provided to patient.     Kellie Simmering, LPN  07/02/5186

## 2018-07-29 DIAGNOSIS — G4733 Obstructive sleep apnea (adult) (pediatric): Secondary | ICD-10-CM | POA: Diagnosis not present

## 2018-08-10 ENCOUNTER — Encounter: Payer: Self-pay | Admitting: Adult Health

## 2018-08-28 DIAGNOSIS — G4733 Obstructive sleep apnea (adult) (pediatric): Secondary | ICD-10-CM | POA: Diagnosis not present

## 2018-09-12 ENCOUNTER — Telehealth: Payer: Self-pay | Admitting: *Deleted

## 2018-09-12 ENCOUNTER — Other Ambulatory Visit: Payer: Self-pay | Admitting: Internal Medicine

## 2018-09-12 NOTE — Telephone Encounter (Signed)
Due to current COVID 19 pandemic, our office is severely reducing in office visits until further notice, in order to minimize the risk to our patients and healthcare providers.  Pt understands that although there may be some limitations with this type of visit, we will take all precautions to reduce any security or privacy concerns.  Pt understands that this will be treated like an in office visit and we will file with pt's insurance, and there may be a patient responsible charge related to this service. Pt consented to [email or web address] visit.  email sent to [email or web address].

## 2018-09-13 ENCOUNTER — Other Ambulatory Visit: Payer: Self-pay | Admitting: Internal Medicine

## 2018-09-14 ENCOUNTER — Other Ambulatory Visit: Payer: Self-pay

## 2018-09-14 ENCOUNTER — Ambulatory Visit: Payer: Medicare Other | Admitting: Adult Health

## 2018-09-21 ENCOUNTER — Telehealth (INDEPENDENT_AMBULATORY_CARE_PROVIDER_SITE_OTHER): Payer: Medicare Other | Admitting: Adult Health

## 2018-09-21 DIAGNOSIS — G4733 Obstructive sleep apnea (adult) (pediatric): Secondary | ICD-10-CM

## 2018-09-21 NOTE — Progress Notes (Signed)
  [facility] [address] [place]. [place] [address] [phone]     Virtual Visit via Telephone Note  I connected with April Larson on 09/21/18 at 11:30 AM EDT by telephone located remotely at [facility] and verified that I am speaking with the correct person using two identifiers who reports being located at home   Visit scheduled by Ward Givens.I discussed the limitations, risks, security and privacy concerns of performing an evaluation and management service by telephone and the availability of in person appointments. I also discussed with the patient that there may be a patient responsible charge related to this service. The patient expressed understanding and agreed to proceed.   The patient was originally scheduled for my chart visit however she could not access her video therefore was changed to a telephone visit.   History of Present Illness:  April Larson is a 68 y.o. female who has been followed in this office for obstructive sleep apnea on CPAP.  She was initially scheduled for face-to-face office follow up visit today time but due to [place], visit rescheduled for non-face-to-face telephone visit with patients consent. Unable to participate in video visit due to lack of access to device with camera.    The patient download shows that she use her machine 11 out of 30 days for compliance of 37%.  She used her machine greater than 4 hours each night.  On average she uses at 5 hours and 27 minutes.  Her residual AHI was 3.8 on 9 cm of water with EPR of 3.  The patient has not used the machine for the month of May.  She states that she had a fall and was having trouble breathing therefore she did not restart the CPAP.  She states that she has followed a clean machine and plans to restart the CPAP.   Observations/Objective:    Neurological examination  Mentation: Alert oriented to time, place, history  taking. speech and language fluent  Assessment and Plan:  1.  Obstructive sleep apnea on CPAP  The patient is encouraged to restart using her CPAP.  She is encouraged to use machine nightly and greater than 4 hours each night.  She is advised that if her symptoms worsen or she develops new symptoms she should let us know.  She will follow-up in 3 months or sooner if needed.  Follow Up Instructions:    FU in 3 months   I discussed the assessment and treatment plan with the patient.  The patient was provided an opportunity to ask questions and all were answered to their satisfaction. The patient agreed with the plan and verbalized an understanding of the instructions.   I provided 10 minutes of non-face-to-face time during this encounter.    Ward Givens NP-C  [facility] [address] [place], [place] [phone]  .

## 2018-09-28 DIAGNOSIS — G4733 Obstructive sleep apnea (adult) (pediatric): Secondary | ICD-10-CM | POA: Diagnosis not present

## 2018-10-11 DIAGNOSIS — Z961 Presence of intraocular lens: Secondary | ICD-10-CM | POA: Diagnosis not present

## 2018-10-11 DIAGNOSIS — E119 Type 2 diabetes mellitus without complications: Secondary | ICD-10-CM | POA: Diagnosis not present

## 2018-10-11 LAB — HM DIABETES EYE EXAM

## 2018-10-12 ENCOUNTER — Other Ambulatory Visit: Payer: Self-pay | Admitting: Internal Medicine

## 2018-10-12 ENCOUNTER — Encounter: Payer: Self-pay | Admitting: Internal Medicine

## 2018-10-12 ENCOUNTER — Other Ambulatory Visit: Payer: Self-pay

## 2018-10-12 ENCOUNTER — Ambulatory Visit (INDEPENDENT_AMBULATORY_CARE_PROVIDER_SITE_OTHER): Payer: Medicare Other | Admitting: Internal Medicine

## 2018-10-12 VITALS — BP 148/80 | HR 89 | Temp 98.4°F | Ht 63.2 in | Wt 183.2 lb

## 2018-10-12 DIAGNOSIS — I129 Hypertensive chronic kidney disease with stage 1 through stage 4 chronic kidney disease, or unspecified chronic kidney disease: Secondary | ICD-10-CM | POA: Diagnosis not present

## 2018-10-12 DIAGNOSIS — E1122 Type 2 diabetes mellitus with diabetic chronic kidney disease: Secondary | ICD-10-CM | POA: Diagnosis not present

## 2018-10-12 DIAGNOSIS — N182 Chronic kidney disease, stage 2 (mild): Secondary | ICD-10-CM | POA: Diagnosis not present

## 2018-10-12 DIAGNOSIS — Z79899 Other long term (current) drug therapy: Secondary | ICD-10-CM

## 2018-10-12 DIAGNOSIS — E6609 Other obesity due to excess calories: Secondary | ICD-10-CM

## 2018-10-12 DIAGNOSIS — Z6832 Body mass index (BMI) 32.0-32.9, adult: Secondary | ICD-10-CM

## 2018-10-12 DIAGNOSIS — Z0000 Encounter for general adult medical examination without abnormal findings: Secondary | ICD-10-CM

## 2018-10-12 DIAGNOSIS — W19XXXA Unspecified fall, initial encounter: Secondary | ICD-10-CM

## 2018-10-12 LAB — POCT UA - MICROALBUMIN
Albumin/Creatinine Ratio, Urine, POC: <30
Creatinine, POC: 50 mg/dL
Microalbumin Ur, POC: 10 mg/L

## 2018-10-12 LAB — POCT URINALYSIS DIPSTICK
Bilirubin, UA: NEGATIVE
Blood, UA: NEGATIVE
Glucose, UA: NEGATIVE
Ketones, UA: NEGATIVE
Nitrite, UA: NEGATIVE
Protein, UA: NEGATIVE
Spec Grav, UA: 1.015 (ref 1.010–1.025)
Urobilinogen, UA: 0.2 E.U./dL
pH, UA: 7 (ref 5.0–8.0)

## 2018-10-12 MED ORDER — PNEUMOCOCCAL 13-VAL CONJ VACC IM SUSP: 0.5000 mL | INTRAMUSCULAR | 0 refills | Status: AC

## 2018-10-12 NOTE — Patient Instructions (Signed)
Health Maintenance, Female Adopting a healthy lifestyle and getting preventive care are important in promoting health and wellness. Ask your health care provider about:  The right schedule for you to have regular tests and exams.  Things you can do on your own to prevent diseases and keep yourself healthy. What should I know about diet, weight, and exercise? Eat a healthy diet   Eat a diet that includes plenty of vegetables, fruits, low-fat dairy products, and lean protein.  Do not eat a lot of foods that are high in solid fats, added sugars, or sodium. Maintain a healthy weight Body mass index (BMI) is used to identify weight problems. It estimates body fat based on height and weight. Your health care provider can help determine your BMI and help you achieve or maintain a healthy weight. Get regular exercise Get regular exercise. This is one of the most important things you can do for your health. Most adults should:  Exercise for at least 150 minutes each week. The exercise should increase your heart rate and make you sweat (moderate-intensity exercise).  Do strengthening exercises at least twice a week. This is in addition to the moderate-intensity exercise.  Spend less time sitting. Even light physical activity can be beneficial. Watch cholesterol and blood lipids Have your blood tested for lipids and cholesterol at 67 years of age, then have this test every 5 years. Have your cholesterol levels checked more often if:  Your lipid or cholesterol levels are high.  You are older than 67 years of age.  You are at high risk for heart disease. What should I know about cancer screening? Depending on your health history and family history, you may need to have cancer screening at various ages. This may include screening for:  Breast cancer.  Cervical cancer.  Colorectal cancer.  Skin cancer.  Lung cancer. What should I know about heart disease, diabetes, and high blood  pressure? Blood pressure and heart disease  High blood pressure causes heart disease and increases the risk of stroke. This is more likely to develop in people who have high blood pressure readings, are of African descent, or are overweight.  Have your blood pressure checked: ? Every 3-5 years if you are 18-39 years of age. ? Every year if you are 40 years old or older. Diabetes Have regular diabetes screenings. This checks your fasting blood sugar level. Have the screening done:  Once every three years after age 40 if you are at a normal weight and have a low risk for diabetes.  More often and at a younger age if you are overweight or have a high risk for diabetes. What should I know about preventing infection? Hepatitis B If you have a higher risk for hepatitis B, you should be screened for this virus. Talk with your health care provider to find out if you are at risk for hepatitis B infection. Hepatitis C Testing is recommended for:  Everyone born from 1945 through 1965.  Anyone with known risk factors for hepatitis C. Sexually transmitted infections (STIs)  Get screened for STIs, including gonorrhea and chlamydia, if: ? You are sexually active and are younger than 67 years of age. ? You are older than 67 years of age and your health care provider tells you that you are at risk for this type of infection. ? Your sexual activity has changed since you were last screened, and you are at increased risk for chlamydia or gonorrhea. Ask your health care provider if   you are at risk.  Ask your health care provider about whether you are at high risk for HIV. Your health care provider may recommend a prescription medicine to help prevent HIV infection. If you choose to take medicine to prevent HIV, you should first get tested for HIV. You should then be tested every 3 months for as long as you are taking the medicine. Pregnancy  If you are about to stop having your period (premenopausal) and  you may become pregnant, seek counseling before you get pregnant.  Take 400 to 800 micrograms (mcg) of folic acid every day if you become pregnant.  Ask for birth control (contraception) if you want to prevent pregnancy. Osteoporosis and menopause Osteoporosis is a disease in which the bones lose minerals and strength with aging. This can result in bone fractures. If you are 35 years old or older, or if you are at risk for osteoporosis and fractures, ask your health care provider if you should:  Be screened for bone loss.  Take a calcium or vitamin D supplement to lower your risk of fractures.  Be given hormone replacement therapy (HRT) to treat symptoms of menopause. Follow these instructions at home: Lifestyle  Do not use any products that contain nicotine or tobacco, such as cigarettes, e-cigarettes, and chewing tobacco. If you need help quitting, ask your health care provider.  Do not use street drugs.  Do not share needles.  Ask your health care provider for help if you need support or information about quitting drugs. Alcohol use  Do not drink alcohol if: ? Your health care provider tells you not to drink. ? You are pregnant, may be pregnant, or are planning to become pregnant.  If you drink alcohol: ? Limit how much you use to 0-1 drink a day. ? Limit intake if you are breastfeeding.  Be aware of how much alcohol is in your drink. In the [place] one drink equals one 12 oz bottle of beer (355 mL), one 5 oz glass of wine (148 mL), or one 1 oz glass of hard liquor (44 mL). General instructions  Schedule regular health, dental, and eye exams.  Stay current with your vaccines.  Tell your health care provider if: ? You often feel depressed. ? You have ever been abused or do not feel safe at home. Summary  Adopting a healthy lifestyle and getting preventive care are important in promoting health and wellness.  Follow your health care provider's instructions about healthy  diet, exercising, and getting tested or screened for diseases.  Follow your health care provider's instructions on monitoring your cholesterol and blood pressure. This information is not intended to replace advice given to you by your health care provider. Make sure you discuss any questions you have with your health care provider. Document Released: 10/05/2010 Document Revised: 03/15/2018 Document Reviewed: 03/15/2018 Elsevier Patient Education  2020 [place].   Sciatica  Sciatica is pain, weakness, tingling, or loss of feeling (numbness) along the sciatic nerve. The sciatic nerve starts in the lower back and goes down the back of each leg. Sciatica usually goes away on its own or with treatment. Sometimes, sciatica may come back (recur). What are the causes? This condition happens when the sciatic nerve is pinched or has pressure put on it. This may be the result of:  A disk in between the bones of the spine bulging out too far (herniated disk).  Changes in the spinal disks that occur with aging.  A condition that affects  a muscle in the butt.  Extra bone growth near the sciatic nerve.  A break (fracture) of the area between your hip bones (pelvis).  Pregnancy.  Tumor. This is rare. What increases the risk? You are more likely to develop this condition if you:  Play sports that put pressure or stress on the spine.  Have poor strength and ease of movement (flexibility).  Have had a back injury in the past.  Have had back surgery.  Sit for long periods of time.  Do activities that involve bending or lifting over and over again.  Are very overweight (obese). What are the signs or symptoms? Symptoms can vary from mild to very bad. They may include:  Any of these problems in the lower back, leg, hip, or butt: ? Mild tingling, loss of feeling, or dull aches. ? Burning sensations. ? Sharp pains.  Loss of feeling in the back of the calf or the sole of the foot.   Leg weakness.  Very bad back pain that makes it hard to move. These symptoms may get worse when you cough, sneeze, or laugh. They may also get worse when you sit or stand for long periods of time. How is this treated? This condition often gets better without any treatment. However, treatment may include:  Changing or cutting back on physical activity when you have pain.  Doing exercises and stretching.  Putting ice or heat on the affected area.  Medicines that help: ? To relieve pain and swelling. ? To relax your muscles.  Shots (injections) of medicines that help to relieve pain, irritation, and swelling.  Surgery. Follow these instructions at home: Medicines  Take over-the-counter and prescription medicines only as told by your doctor.  Ask your doctor if the medicine prescribed to you: ? Requires you to avoid driving or using heavy machinery. ? Can cause trouble pooping (constipation). You may need to take these steps to prevent or treat trouble pooping:  Drink enough fluids to keep your pee (urine) pale yellow.  Take over-the-counter or prescription medicines.  Eat foods that are high in fiber. These include beans, whole grains, and fresh fruits and vegetables.  Limit foods that are high in fat and sugar. These include fried or sweet foods. Managing pain      If told, put ice on the affected area. ? Put ice in a plastic bag. ? Place a towel between your skin and the bag. ? Leave the ice on for 20 minutes, 2-3 times a day.  If told, put heat on the affected area. Use the heat source that your doctor tells you to use, such as a moist heat pack or a heating pad. ? Place a towel between your skin and the heat source. ? Leave the heat on for 20-30 minutes. ? Remove the heat if your skin turns bright red. This is very important if you are unable to feel pain, heat, or cold. You may have a greater risk of getting burned. Activity   Return to your normal activities as  told by your doctor. Ask your doctor what activities are safe for you.  Avoid activities that make your symptoms worse.  Take short rests during the day. ? When you rest for a long time, do some physical activity or stretching between periods of rest. ? Avoid sitting for a long time without moving. Get up and move around at least one time each hour.  Exercise and stretch regularly, as told by your doctor.  Do  not lift anything that is heavier than 10 lb (4.5 kg) while you have symptoms of sciatica. ? Avoid lifting heavy things even when you do not have symptoms. ? Avoid lifting heavy things over and over.  When you lift objects, always lift in a way that is safe for your body. To do this, you should: ? Bend your knees. ? Keep the object close to your body. ? Avoid twisting. General instructions  Stay at a healthy weight.  Wear comfortable shoes that support your feet. Avoid wearing high heels.  Avoid sleeping on a mattress that is too soft or too hard. You might have less pain if you sleep on a mattress that is firm enough to support your back.  Keep all follow-up visits as told by your doctor. This is important. Contact a doctor if:  You have pain that: ? Wakes you up when you are sleeping. ? Gets worse when you lie down. ? Is worse than the pain you have had in the past. ? Lasts longer than 4 weeks.  You lose weight without trying. Get help right away if:  You cannot control when you pee (urinate) or poop (have a bowel movement).  You have weakness in any of these areas and it gets worse: ? Lower back. ? The area between your hip bones. ? Butt. ? Legs.  You have redness or swelling of your back.  You have a burning feeling when you pee. Summary  Sciatica is pain, weakness, tingling, or loss of feeling (numbness) along the sciatic nerve.  This condition happens when the sciatic nerve is pinched or has pressure put on it.  Sciatica can cause pain, tingling, or  loss of feeling (numbness) in the lower back, legs, hips, and butt.  Treatment often includes rest, exercise, medicines, and putting ice or heat on the affected area. This information is not intended to replace advice given to you by your health care provider. Make sure you discuss any questions you have with your health care provider. Document Released: 12/30/2007 Document Revised: 04/10/2018 Document Reviewed: 04/10/2018 Elsevier Patient Education  [place].

## 2018-10-13 ENCOUNTER — Other Ambulatory Visit: Payer: Self-pay | Admitting: Internal Medicine

## 2018-10-13 LAB — LIPID PANEL
Chol/HDL Ratio: 2.1 ratio (ref 0.0–4.4)
Cholesterol, Total: 164 mg/dL (ref 100–199)
HDL: 77 mg/dL (ref >39)
LDL Calculated: 65 mg/dL (ref 0–99)
Triglycerides: 109 mg/dL (ref 0–149)
VLDL Cholesterol Cal: 22 mg/dL (ref 5–40)

## 2018-10-13 LAB — CBC
Hematocrit: 34.6 % (ref 34.0–46.6)
Hemoglobin: 11.4 g/dL (ref 11.1–15.9)
MCH: 27.9 pg (ref 26.6–33.0)
MCHC: 32.9 g/dL (ref 31.5–35.7)
MCV: 85 fL (ref 79–97)
Platelets: 382 10*3/uL (ref 150–450)
RBC: 4.08 x10E6/uL (ref 3.77–5.28)
RDW: 14.2 % (ref 11.7–15.4)
WBC: 8.7 10*3/uL (ref 3.4–10.8)

## 2018-10-13 LAB — CMP14+EGFR
ALT: 16 IU/L (ref 0–32)
AST: 17 IU/L (ref 0–40)
Albumin/Globulin Ratio: 1.6 (ref 1.2–2.2)
Albumin: 4.4 g/dL (ref 3.8–4.8)
Alkaline Phosphatase: 94 IU/L (ref 39–117)
BUN/Creatinine Ratio: 17 (ref 12–28)
BUN: 18 mg/dL (ref 8–27)
Bilirubin Total: <0.2 mg/dL (ref 0.0–1.2)
CO2: 22 mmol/L (ref 20–29)
Calcium: 9.7 mg/dL (ref 8.7–10.3)
Chloride: 103 mmol/L (ref 96–106)
Creatinine, Ser: 1.03 mg/dL — ABNORMAL HIGH (ref 0.57–1.00)
GFR calc Af Amer: 65 mL/min/{1.73_m2} (ref >59)
GFR calc non Af Amer: 57 mL/min/{1.73_m2} — ABNORMAL LOW (ref >59)
Globulin, Total: 2.7 g/dL (ref 1.5–4.5)
Glucose: 83 mg/dL (ref 65–99)
Potassium: 4.4 mmol/L (ref 3.5–5.2)
Sodium: 139 mmol/L (ref 134–144)
Total Protein: 7.1 g/dL (ref 6.0–8.5)

## 2018-10-13 LAB — VITAMIN B12: Vitamin B-12: 634 pg/mL (ref 232–1245)

## 2018-10-13 LAB — HEMOGLOBIN A1C
Est. average glucose Bld gHb Est-mCnc: 126 mg/dL
Hgb A1c MFr Bld: 6 % — ABNORMAL HIGH (ref 4.8–5.6)

## 2018-10-14 NOTE — Progress Notes (Signed)
Subjective:     Patient ID: April Larson , female    DOB: 11/14/1950 , 68 y.o.   MRN: [id number]   Chief Complaint  Patient presents with  . Annual Exam  . Diabetes  . Hypertension    HPI  She is here today for a full physical exam. She is no longer followed by GYN.   Diabetes She presents for her follow-up diabetic visit. She has type 2 diabetes mellitus. There are no hypoglycemic associated symptoms. Pertinent negatives for hypoglycemia include no headaches. Pertinent negatives for diabetes include no blurred vision and no chest pain. There are no hypoglycemic complications. Risk factors for coronary artery disease include diabetes mellitus, dyslipidemia, hypertension, post-menopausal and sedentary lifestyle. Current diabetic treatment includes insulin injections. She is compliant with treatment most of the time. Her weight is fluctuating minimally. She is following a diabetic diet. She participates in exercise intermittently. Her home blood glucose trend is fluctuating minimally. Her breakfast blood glucose is taken between 8-9 am. Her breakfast blood glucose range is generally 70-90 mg/dl. An ACE inhibitor/angiotensin II receptor blocker is being taken.  Hypertension This is a chronic problem. The current episode started more than 1 year ago. Condition status: FAIR CONTROL. Pertinent negatives include no anxiety, blurred vision, chest pain or headaches. The current treatment provides moderate improvement. Compliance problems include exercise.  Hypertensive end-organ damage includes kidney disease.     Past Medical History:  Diagnosis Date  . Diabetes mellitus without complication ([place])   . Hypertension   . Substance abuse ([place])      Family History  Problem Relation Age of Onset  . Cancer Mother   . Diabetes Father   . Cancer Brother   . Breast cancer Maternal Aunt      Current Outpatient Medications:  .  ACCU-CHEK FASTCLIX LANCETS MISC, USE TO CHECK BLOOD  SUGARS TWICE DAILY, Disp: 100 each, Rfl: 11 .  ACCU-CHEK GUIDE test strip, USE TO CHECK BLOOD SUGARS TWICE DAILY AS DIRECTED, Disp: 100 each, Rfl: 11 .  aspirin EC 81 MG tablet, Take 81 mg by mouth daily., Disp: , Rfl:  .  B-D ULTRAFINE III SHORT PEN 31G X 8 MM MISC, USE AS DIRECTED TWICE DAILY, Disp: 100 each, Rfl: 11 .  Biotin 10 MG CAPS, Take 1 tablet by mouth daily., Disp: , Rfl:  .  cetirizine (ZYRTEC) 10 MG tablet, Take 10 mg by mouth at bedtime., Disp: , Rfl:  .  cholecalciferol (VITAMIN D) 400 UNITS TABS, Take by mouth daily. 5,000 units, Disp: , Rfl:  .  LIVALO 4 MG TABS, TAKE 1 TABLET BY MOUTH DAILY, Disp: 90 tablet, Rfl: 1 .  loratadine (CLARITIN) 10 MG tablet, Take 10 mg by mouth daily., Disp: , Rfl:  .  meloxicam (MOBIC) 7.5 MG tablet, TAKE 1 TABLET(7.5 MG) BY MOUTH TWICE DAILY AS NEEDED FOR PAIN, Disp: 180 tablet, Rfl: 1 .  metFORMIN (GLUCOPHAGE) 500 MG tablet, TAKE 1 TABLET BY MOUTH TWICE DAILY( EVERY TWELVE HOURS), Disp: 180 tablet, Rfl: 1 .  Multiple Vitamins-Minerals (ONE-A-DAY WOMENS 50+ ADVANTAGE PO), Take 1 tablet by mouth daily., Disp: , Rfl:  .  omeprazole (PRILOSEC) 40 MG capsule, TAKE 1 CAPSULE BY MOUTH EVERY DAY BEFORE A MEAL, Disp: 90 capsule, Rfl: 1 .  Turmeric 500 MG CAPS, Take 1 capsule by mouth., Disp: , Rfl:  .  XULTOPHY 100-3.6 UNIT-MG/ML SOPN, INJECT 25 UNITS SUBCUTANEOUS DAILY, Disp: 9 pen, Rfl: 2 .  fluticasone (FLONASE) 50 MCG/ACT nasal spray, SHAKE  LIQUID AND USE 2 SPRAYS IN EACH NOSTRIL DAILY, Disp: 16 g, Rfl: 2 .  telmisartan-hydrochlorothiazide (MICARDIS HCT) 40-12.5 MG tablet, TAKE 1 TABLET BY MOUTH EVERY DAY, Disp: 90 tablet, Rfl: 1   No Known Allergies    The patient states she uses none for birth control. Last LMP was No LMP recorded. Patient is postmenopausal.. Negative for Dysmenorrhea Negative for: breast discharge, breast lump(s), breast pain and breast self exam. Associated symptoms include abnormal vaginal bleeding. Pertinent negatives include  abnormal bleeding (hematology), anxiety, decreased libido, depression, difficulty falling sleep, dyspareunia, history of infertility, nocturia, sexual dysfunction, sleep disturbances, urinary incontinence, urinary urgency, vaginal discharge and vaginal itching. Diet regular.The patient states her exercise level is  intermittent.   . The patient's tobacco use is:  Social History   Tobacco Use  Smoking Status Former Smoker  . Packs/day: 1.00  . Years: 30.00  . Pack years: 30.00  . Types: Cigarettes  . Quit date: 2004  . Years since quitting: 16.5  Smokeless Tobacco Never Used  . She has been exposed to passive smoke. The patient's alcohol use is:  Social History   Substance and Sexual Activity  Alcohol Use No  . Alcohol/week: 0.0 standard drinks    Review of Systems  Constitutional: Negative.   HENT: Negative.   Eyes: Negative.  Negative for blurred vision.  Respiratory: Negative.   Cardiovascular: Negative.  Negative for chest pain.  Gastrointestinal: Negative.   Endocrine: Negative.   Genitourinary: Negative.   Musculoskeletal: Positive for arthralgias.       She reports she hurt her shoulder about two weeks ago. States she fell back when trying to sit in chair, hurt r shoulder, did not go to urgent care.  She did not seek medical attention due to [place]. Her sx have since resolved.   Skin: Negative.   Allergic/Immunologic: Negative.   Neurological: Negative.  Negative for headaches.  Hematological: Negative.   Psychiatric/Behavioral: Negative.      Today's Vitals   10/12/18 1430  BP: (!) 148/80  Pulse: 89  Temp: 98.4 F (36.9 C)  TempSrc: Oral  Weight: 183 lb 3.2 oz (83.1 kg)  Height: 5' 3.2" (1.605 m)  PainSc: 0-No pain   Body mass index is 32.25 kg/m.   Objective:  Physical Exam Vitals signs and nursing note reviewed.  Constitutional:      Appearance: Normal appearance.  HENT:     Head: Normocephalic and atraumatic.     Right Ear: Tympanic membrane, ear  canal and external ear normal.     Left Ear: Tympanic membrane, ear canal and external ear normal.     Nose: Nose normal.     Mouth/Throat:     Mouth: Mucous membranes are moist.     Pharynx: Oropharynx is clear.  Eyes:     Extraocular Movements: Extraocular movements intact.     Conjunctiva/sclera: Conjunctivae normal.     Pupils: Pupils are equal, round, and reactive to light.  Neck:     Musculoskeletal: Normal range of motion and neck supple.  Cardiovascular:     Rate and Rhythm: Normal rate and regular rhythm.     Pulses: Normal pulses.          Dorsalis pedis pulses are 2+ on the right side and 2+ on the left side.     Heart sounds: Normal heart sounds.  Pulmonary:     Effort: Pulmonary effort is normal.     Breath sounds: Normal breath sounds.  Chest:  Breasts: Tanner Score is 5.        Right: Normal. No swelling, bleeding, inverted nipple, mass, nipple discharge or skin change.        Left: Normal. No swelling, bleeding, inverted nipple, mass, nipple discharge or skin change.  Abdominal:     General: Abdomen is flat. Bowel sounds are normal.     Palpations: Abdomen is soft.  Genitourinary:    Comments: deferred Musculoskeletal: Normal range of motion.  Feet:     Right foot:     Protective Sensation: 5 sites tested. 5 sites sensed.     Skin integrity: Skin integrity normal.     Toenail Condition: Right toenails are normal.     Left foot:     Protective Sensation: 5 sites tested. 5 sites sensed.     Skin integrity: Skin integrity normal.     Toenail Condition: Left toenails are normal.  Skin:    General: Skin is warm and dry.  Neurological:     General: No focal deficit present.     Mental Status: She is alert and oriented to person, place, and time.  Psychiatric:        Mood and Affect: Mood normal.        Behavior: Behavior normal.         Assessment And Plan:     1. Routine general medical examination at health care facility  A full exam was  performed. She is encouraged to perform monthly SBE. PATIENT HAS BEEN ADVISED TO GET 30-45 MINUTES REGULAR EXERCISE NO LESS THAN FOUR TO FIVE DAYS PER WEEK - BOTH WEIGHTBEARING EXERCISES AND AEROBIC ARE RECOMMENDED.  SHE IS ADVISED TO FOLLOW A HEALTHY DIET WITH AT LEAST SIX FRUITS/VEGGIES PER DAY, DECREASE INTAKE OF RED MEAT, AND TO INCREASE FISH INTAKE TO TWO DAYS PER WEEK.  MEATS/FISH SHOULD NOT BE FRIED, BAKED OR BROILED IS PREFERABLE.  I SUGGEST WEARING SPF 50 SUNSCREEN ON EXPOSED PARTS AND ESPECIALLY WHEN IN THE DIRECT SUNLIGHT FOR AN EXTENDED PERIOD OF TIME.  PLEASE AVOID FAST FOOD RESTAURANTS AND INCREASE YOUR WATER INTAKE.   2. Diabetes mellitus with stage 2 chronic kidney disease ([place])  Diabetic foot exam was performed. I DISCUSSED WITH THE PATIENT AT LENGTH REGARDING THE GOALS OF GLYCEMIC CONTROL AND POSSIBLE LONG-TERM COMPLICATIONS.  I  ALSO STRESSED THE IMPORTANCE OF COMPLIANCE WITH HOME GLUCOSE MONITORING, DIETARY RESTRICTIONS INCLUDING AVOIDANCE OF SUGARY DRINKS/PROCESSED FOODS,  ALONG WITH REGULAR EXERCISE.  I  ALSO STRESSED THE IMPORTANCE OF ANNUAL EYE EXAMS, SELF FOOT CARE AND COMPLIANCE WITH OFFICE VISITS.  - CMP14+EGFR - CBC - Lipid panel - Hemoglobin A1c - POCT UA - Microalbumin - POCT Urinalysis Dipstick (81002)  3. Hypertensive nephropathy  Fair control. She has yet to take her meds today. She reports compliance with meds. She is encouraged to avoid adding salt to her foods and to incorporate more exercise into her daily routine. EKG performed, no acute changes noted.   - EKG 12-Lead  4. Fall, initial encounter  She is encouraged to make sure chair is stable prior to sitting. Also encouraged to look behind her to ensure chair is in proper position prior to sitting.   5. Class 1 obesity due to excess calories with serious comorbidity and body mass index (BMI) of 32.0 to 32.9 in adult  Importance of achieving optimal weight to decrease risk of cardiovascular disease and  cancers was discussed with the patient in full detail. She is encouraged to start slowly - start with 10  minutes twice daily at least three to four days per week and to gradually build to 30 minutes five days weekly. She was given tips to incorporate more activity into her daily routine - take stairs when possible, park farther away from grocery stores, etc.    6. Drug therapy  - Vitamin B12        Maximino Greenland, MD    THE PATIENT IS ENCOURAGED TO PRACTICE SOCIAL DISTANCING DUE TO THE COVID-19 PANDEMIC.

## 2018-10-18 ENCOUNTER — Encounter: Payer: Self-pay | Admitting: Internal Medicine

## 2018-10-19 ENCOUNTER — Encounter: Payer: Self-pay | Admitting: Internal Medicine

## 2018-10-28 DIAGNOSIS — G4733 Obstructive sleep apnea (adult) (pediatric): Secondary | ICD-10-CM | POA: Diagnosis not present

## 2018-11-01 ENCOUNTER — Other Ambulatory Visit: Payer: Self-pay | Admitting: Internal Medicine

## 2018-11-01 DIAGNOSIS — Z1231 Encounter for screening mammogram for malignant neoplasm of breast: Secondary | ICD-10-CM

## 2018-11-02 DIAGNOSIS — G4733 Obstructive sleep apnea (adult) (pediatric): Secondary | ICD-10-CM | POA: Diagnosis not present

## 2018-11-28 DIAGNOSIS — G4733 Obstructive sleep apnea (adult) (pediatric): Secondary | ICD-10-CM | POA: Diagnosis not present

## 2018-12-13 ENCOUNTER — Other Ambulatory Visit: Payer: Self-pay

## 2018-12-13 MED ORDER — OMEPRAZOLE 40 MG PO CPDR: DELAYED_RELEASE_CAPSULE | ORAL | 1 refills | Status: DC

## 2018-12-28 ENCOUNTER — Ambulatory Visit (INDEPENDENT_AMBULATORY_CARE_PROVIDER_SITE_OTHER): Payer: Medicare Other | Admitting: Adult Health

## 2018-12-28 ENCOUNTER — Encounter: Payer: Self-pay | Admitting: Adult Health

## 2018-12-28 ENCOUNTER — Other Ambulatory Visit: Payer: Self-pay

## 2018-12-28 VITALS — BP 126/70 | HR 84 | Temp 96.6°F | Ht 64.0 in | Wt 180.0 lb

## 2018-12-28 DIAGNOSIS — Z9989 Dependence on other enabling machines and devices: Secondary | ICD-10-CM | POA: Diagnosis not present

## 2018-12-28 DIAGNOSIS — G4733 Obstructive sleep apnea (adult) (pediatric): Secondary | ICD-10-CM | POA: Diagnosis not present

## 2018-12-28 NOTE — Patient Instructions (Signed)
Continue using CPAP nightly and greater than 4 hours each night °If your symptoms worsen or you develop new symptoms please let us know.  ° °

## 2018-12-28 NOTE — Progress Notes (Signed)
PATIENT: April Larson DOB: 1950-11-19  REASON FOR VISIT: follow up HISTORY FROM: patient  HISTORY OF PRESENT ILLNESS: Today 12/28/18:  Ms. Larson is a 68 year old female with a history of obstructive sleep apnea on CPAP.  She returns today for follow-up.  Her download indicates that she used her machine 23 out of 30 days for compliance of 77%.  She used her machine greater than 4 hours 19 days for compliance of 63%.  On average she uses her machine 5 hours and 46 minutes.  Her residual AHI is 2 on 9 cm of water.  She does have a significant leak in the 95th percentile at 30.8 L/min.  She reports that she recently changed her mask to a different style.  She is hoping that that fixes her leak.  She states that there are some nights she takes the machine off if she cannot get comfortable.  She returns today for evaluation.  HISTORY The patient download shows that she use her machine 11 out of 30 days for compliance of 37%.  She used her machine greater than 4 hours each night.  On average she uses at 5 hours and 27 minutes.  Her residual AHI was 3.8 on 9 cm of water with EPR of 3.  The patient has not used the machine for the month of May.  She states that she had a fall and was having trouble breathing therefore she did not restart the CPAP.  She states that she has followed a clean machine and plans to restart the CPAP.  REVIEW OF SYSTEMS: Out of a complete 14 system review of symptoms, the patient complains only of the following symptoms, and all other reviewed systems are negative.  Epworth sleepiness score 5 fatigue severity score 21  ALLERGIES: No Known Allergies  HOME MEDICATIONS: Outpatient Medications Prior to Visit  Medication Sig Dispense Refill  . ACCU-CHEK FASTCLIX LANCETS MISC USE TO CHECK BLOOD SUGARS TWICE DAILY 100 each 11  . ACCU-CHEK GUIDE test strip USE TO CHECK BLOOD SUGARS TWICE DAILY AS DIRECTED 100 each 11  . Ascorbic Acid (VITAMIN C PO) Take 1  tablet by mouth daily.    Marland Kitchen aspirin EC 81 MG tablet Take 81 mg by mouth daily.    . B-D ULTRAFINE III SHORT PEN 31G X 8 MM MISC USE AS DIRECTED TWICE DAILY 100 each 11  . Biotin 10 MG CAPS Take 1 tablet by mouth daily.    . cetirizine (ZYRTEC) 10 MG tablet Take 10 mg by mouth at bedtime.    . cholecalciferol (VITAMIN D) 400 UNITS TABS Take by mouth daily. 5,000 units    . fluticasone (FLONASE) 50 MCG/ACT nasal spray SHAKE LIQUID AND USE 2 SPRAYS IN EACH NOSTRIL DAILY 16 g 2  . LIVALO 4 MG TABS TAKE 1 TABLET BY MOUTH DAILY 90 tablet 1  . loratadine (CLARITIN) 10 MG tablet Take 10 mg by mouth daily.    . meloxicam (MOBIC) 7.5 MG tablet TAKE 1 TABLET(7.5 MG) BY MOUTH TWICE DAILY AS NEEDED FOR PAIN 180 tablet 1  . metFORMIN (GLUCOPHAGE) 500 MG tablet TAKE 1 TABLET BY MOUTH TWICE DAILY( EVERY TWELVE HOURS) (Patient taking differently: Take 500 mg by mouth daily with breakfast. ) 180 tablet 1  . Multiple Vitamins-Minerals (ONE-A-DAY WOMENS 50+ ADVANTAGE PO) Take 1 tablet by mouth daily.    Marland Kitchen omeprazole (PRILOSEC) 40 MG capsule TAKE 1 CAPSULE BY MOUTH EVERY DAY BEFORE A MEAL 90 capsule 1  . telmisartan-hydrochlorothiazide (  MICARDIS HCT) 40-12.5 MG tablet TAKE 1 TABLET BY MOUTH EVERY DAY 90 tablet 1  . Turmeric 500 MG CAPS Take 1 capsule by mouth.    [name] 100-3.6 UNIT-MG/ML SOPN INJECT 25 UNITS SUBCUTANEOUS DAILY 9 pen 2   No facility-administered medications prior to visit.     PAST MEDICAL HISTORY: Past Medical History:  Diagnosis Date  . Diabetes mellitus without complication ([place])   . Hypertension   . Substance abuse ([place])     PAST SURGICAL HISTORY: Past Surgical History:  Procedure Laterality Date  . EYE SURGERY      FAMILY HISTORY: Family History  Problem Relation Age of Onset  . Cancer Mother   . Diabetes Father   . Cancer Brother   . Breast cancer Maternal Aunt     SOCIAL HISTORY: Social History   Socioeconomic History  . Marital status: Widowed    Spouse name:  Not on file  . Number of children: Not on file  . Years of education: Not on file  . Highest education level: Not on file  Occupational History  . Occupation: retired  Scientific laboratory technician  . Financial resource strain: Not hard at all  . Food insecurity    Worry: Never true    Inability: Never true  . Transportation needs    Medical: Yes    Non-medical: Yes  Tobacco Use  . Smoking status: Former Smoker    Packs/day: 1.00    Years: 30.00    Pack years: 30.00    Types: Cigarettes    Quit date: 2004    Years since quitting: 16.7  . Smokeless tobacco: Never Used  Substance and Sexual Activity  . Alcohol use: No    Alcohol/week: 0.0 standard drinks  . Drug use: No  . Sexual activity: Not Currently  Lifestyle  . Physical activity    Days per week: 7 days    Minutes per session: 30 min  . Stress: Not at all  Relationships  . Social Herbalist on phone: Not on file    Gets together: Not on file    Attends religious service: Not on file    Active member of club or organization: Not on file    Attends meetings of clubs or organizations: Not on file    Relationship status: Not on file  . Intimate partner violence    Fear of current or ex partner: No    Emotionally abused: No    Physically abused: No    Forced sexual activity: No  Other Topics Concern  . Not on file  Social History Narrative  . Not on file      PHYSICAL EXAM  Vitals:   12/28/18 0951  BP: 126/70  Pulse: 84  Temp: (!) 96.6 F (35.9 C)  TempSrc: Oral  Weight: 180 lb (81.6 kg)  Height: 5\' 4"  (1.626 m)   Body mass index is 30.9 kg/m.  Generalized: Well developed, in no acute distress  Chest: Lungs clear to auscultation bilaterally  Neurological examination  Mentation: Alert oriented to time, place, history taking. Follows all commands speech and language fluent Cranial nerve II-XII: Extraocular movements were full, visual field were full on confrontational test Head turning and shoulder  shrug  were normal and symmetric. Motor: The motor testing reveals 5 over 5 strength of all 4 extremities. Good symmetric motor tone is noted throughout.  Sensory: Sensory testing is intact to soft touch on all 4 extremities. No evidence of extinction is  noted.  Gait and station: Gait is normal.    DIAGNOSTIC DATA (LABS, IMAGING, TESTING) - I reviewed patient records, labs, notes, testing and imaging myself where available.  Lab Results  Component Value Date   WBC 8.7 10/12/2018   HGB 11.4 10/12/2018   HCT 34.6 10/12/2018   MCV 85 10/12/2018   PLT 382 10/12/2018      Component Value Date/Time   NA 139 10/12/2018 1510   K 4.4 10/12/2018 1510   CL 103 10/12/2018 1510   CO2 22 10/12/2018 1510   GLUCOSE 83 10/12/2018 1510   GLUCOSE 81 08/20/2017 2105   BUN 18 10/12/2018 1510   CREATININE 1.03 (H) 10/12/2018 1510   CALCIUM 9.7 10/12/2018 1510   PROT 7.1 10/12/2018 1510   ALBUMIN 4.4 10/12/2018 1510   AST 17 10/12/2018 1510   ALT 16 10/12/2018 1510   ALKPHOS 94 10/12/2018 1510   BILITOT <0.2 10/12/2018 1510   GFRNONAA 57 (L) 10/12/2018 1510   GFRAA 65 10/12/2018 1510   Lab Results  Component Value Date   CHOL 164 10/12/2018   HDL 77 10/12/2018   LDLCALC 65 10/12/2018   TRIG 109 10/12/2018   CHOLHDL 2.1 10/12/2018   Lab Results  Component Value Date   HGBA1C 6.0 (H) 10/12/2018   Lab Results  Component Value Date   VITAMINB12 634 10/12/2018       ASSESSMENT AND PLAN 68 y.o. year old female  has a past medical history of Diabetes mellitus without complication ([place]), Hypertension, and Substance abuse ([place]). here with :  1. Obstructive sleep apnea on CPAP  Patient CPAP download shows excellent compliance and good treatment of her apnea.  She is encouraged to continue using CPAP nightly and greater than 4 hours each night.  She is advised that if her symptoms worsen or she develops new symptoms she should let us know.  She will follow-up in 6 months or sooner if  needed   I spent 15 minutes with the patient. 50% of this time was spent reviewing CPAP download  Ward Givens, MSN, NP-C 12/28/2018, 10:14 AM [facility] [address] [place], [place] [address] [phone]

## 2018-12-29 DIAGNOSIS — G4733 Obstructive sleep apnea (adult) (pediatric): Secondary | ICD-10-CM | POA: Diagnosis not present

## 2019-01-06 DIAGNOSIS — R05 Cough: Secondary | ICD-10-CM | POA: Diagnosis not present

## 2019-01-10 ENCOUNTER — Other Ambulatory Visit: Payer: Self-pay

## 2019-01-10 MED ORDER — TELMISARTAN-HCTZ 40-12.5 MG PO TABS: 1.0000 | ORAL_TABLET | Freq: Every day | ORAL | 1 refills | Status: DC

## 2019-01-15 ENCOUNTER — Ambulatory Visit
Admission: RE | Admit: 2019-01-15 | Discharge: 2019-01-15 | Disposition: A | Discharge status: Home / Self Care | Payer: Medicare Other | Source: Ambulatory Visit | Attending: Internal Medicine | Admitting: Internal Medicine

## 2019-01-15 ENCOUNTER — Other Ambulatory Visit: Payer: Self-pay

## 2019-01-15 DIAGNOSIS — Z780 Asymptomatic menopausal state: Secondary | ICD-10-CM | POA: Diagnosis not present

## 2019-01-15 DIAGNOSIS — Z1231 Encounter for screening mammogram for malignant neoplasm of breast: Secondary | ICD-10-CM | POA: Diagnosis not present

## 2019-01-15 DIAGNOSIS — M85851 Other specified disorders of bone density and structure, right thigh: Secondary | ICD-10-CM | POA: Diagnosis not present

## 2019-01-16 ENCOUNTER — Telehealth: Payer: Self-pay

## 2019-01-16 NOTE — Telephone Encounter (Signed)
-----   Message from Glendale Chard, MD sent at 01/16/2019 11:36 AM EDT ----- Its usually 600mg , she can get OTC ----- Message ----- From: Michelle Nasuti, CMA Sent: 01/16/2019  10:59 AM EDT To: Glendale Chard, MD  The pt called and left a message that she is taking 5,000 units of vitamin d3, 1 capsule daily and no she has been taking any calcium.  The pt wants to know how much calcium should she start taking daily to help with her bone density?

## 2019-01-16 NOTE — Telephone Encounter (Signed)
Patient notified

## 2019-01-28 DIAGNOSIS — G4733 Obstructive sleep apnea (adult) (pediatric): Secondary | ICD-10-CM | POA: Diagnosis not present

## 2019-01-29 DIAGNOSIS — Z7189 Other specified counseling: Secondary | ICD-10-CM | POA: Diagnosis not present

## 2019-02-14 ENCOUNTER — Encounter: Payer: Self-pay | Admitting: Internal Medicine

## 2019-02-14 ENCOUNTER — Other Ambulatory Visit: Payer: Self-pay

## 2019-02-14 ENCOUNTER — Ambulatory Visit (INDEPENDENT_AMBULATORY_CARE_PROVIDER_SITE_OTHER): Payer: Medicare Other | Admitting: Internal Medicine

## 2019-02-14 VITALS — BP 126/74 | HR 79 | Temp 98.4°F | Ht 64.0 in | Wt 179.8 lb

## 2019-02-14 DIAGNOSIS — E1122 Type 2 diabetes mellitus with diabetic chronic kidney disease: Secondary | ICD-10-CM | POA: Diagnosis not present

## 2019-02-14 DIAGNOSIS — N182 Chronic kidney disease, stage 2 (mild): Secondary | ICD-10-CM

## 2019-02-14 DIAGNOSIS — M542 Cervicalgia: Secondary | ICD-10-CM

## 2019-02-14 DIAGNOSIS — I129 Hypertensive chronic kidney disease with stage 1 through stage 4 chronic kidney disease, or unspecified chronic kidney disease: Secondary | ICD-10-CM | POA: Diagnosis not present

## 2019-02-14 DIAGNOSIS — Z6830 Body mass index (BMI) 30.0-30.9, adult: Secondary | ICD-10-CM

## 2019-02-14 DIAGNOSIS — E6609 Other obesity due to excess calories: Secondary | ICD-10-CM

## 2019-02-14 DIAGNOSIS — Z23 Encounter for immunization: Secondary | ICD-10-CM

## 2019-02-14 MED ORDER — XULTOPHY 100-3.6 UNIT-MG/ML ~~LOC~~ SOPN: 25.0000 mg | PEN_INJECTOR | Freq: Every day | SUBCUTANEOUS | 2 refills | Status: DC

## 2019-02-14 MED ORDER — MELOXICAM 7.5 MG PO TABS: ORAL_TABLET | ORAL | 0 refills | Status: DC

## 2019-02-14 MED ORDER — PNEUMOCOCCAL 13-VAL CONJ VACC IM SUSP: 0.5000 mL | INTRAMUSCULAR | 0 refills | Status: AC

## 2019-02-14 NOTE — Progress Notes (Signed)
Subjective:     Patient ID: April Larson , female    DOB: 01/10/1951 , 68 y.o.   MRN: [id number]   Chief Complaint  Patient presents with  . Diabetes  . Hypertension    HPI  Diabetes She presents for her follow-up diabetic visit. She has type 2 diabetes mellitus. There are no hypoglycemic associated symptoms. Pertinent negatives for hypoglycemia include no headaches. Pertinent negatives for diabetes include no blurred vision and no chest pain. There are no hypoglycemic complications. Risk factors for coronary artery disease include diabetes mellitus, dyslipidemia, hypertension, post-menopausal and sedentary lifestyle. Current diabetic treatment includes insulin injections. She is compliant with treatment most of the time. Her weight is fluctuating minimally. She is following a diabetic diet. She participates in exercise intermittently. Her home blood glucose trend is fluctuating minimally. Her breakfast blood glucose is taken between 8-9 am. Her breakfast blood glucose range is generally 70-90 mg/dl. An ACE inhibitor/angiotensin II receptor blocker is being taken.  Hypertension This is a chronic problem. The current episode started more than 1 year ago. Condition status: FAIR CONTROL. Pertinent negatives include no anxiety, blurred vision, chest pain or headaches. The current treatment provides moderate improvement. Compliance problems include exercise.  Hypertensive end-organ damage includes kidney disease.     Past Medical History:  Diagnosis Date  . Diabetes mellitus without complication ([place])   . Hypertension   . Substance abuse ([place])      Family History  Problem Relation Age of Onset  . Cancer Mother   . Diabetes Father   . Cancer Brother   . Breast cancer Maternal Aunt      Current Outpatient Medications:  .  ACCU-CHEK FASTCLIX LANCETS MISC, USE TO CHECK BLOOD SUGARS TWICE DAILY, Disp: 100 each, Rfl: 11 .  ACCU-CHEK GUIDE test strip, USE TO CHECK BLOOD  SUGARS TWICE DAILY AS DIRECTED, Disp: 100 each, Rfl: 11 .  Ascorbic Acid (VITAMIN C PO), Take 1 tablet by mouth daily., Disp: , Rfl:  .  aspirin EC 81 MG tablet, Take 81 mg by mouth daily., Disp: , Rfl:  .  B-D ULTRAFINE III SHORT PEN 31G X 8 MM MISC, USE AS DIRECTED TWICE DAILY, Disp: 100 each, Rfl: 11 .  Biotin 10 MG CAPS, Take 1 tablet by mouth daily., Disp: , Rfl:  .  cetirizine (ZYRTEC) 10 MG tablet, Take 10 mg by mouth at bedtime., Disp: , Rfl:  .  cholecalciferol (VITAMIN D) 400 UNITS TABS, Take by mouth daily. 5,000 units, Disp: , Rfl:  .  fluticasone (FLONASE) 50 MCG/ACT nasal spray, SHAKE LIQUID AND USE 2 SPRAYS IN EACH NOSTRIL DAILY, Disp: 16 g, Rfl: 2 .  LIVALO 4 MG TABS, TAKE 1 TABLET BY MOUTH DAILY, Disp: 90 tablet, Rfl: 1 .  loratadine (CLARITIN) 10 MG tablet, Take 10 mg by mouth daily., Disp: , Rfl:  .  metFORMIN (GLUCOPHAGE) 500 MG tablet, TAKE 1 TABLET BY MOUTH TWICE DAILY( EVERY TWELVE HOURS) (Patient taking differently: Take 500 mg by mouth daily with breakfast. ), Disp: 180 tablet, Rfl: 1 .  Multiple Vitamins-Minerals (ONE-A-DAY WOMENS 50+ ADVANTAGE PO), Take 1 tablet by mouth daily., Disp: , Rfl:  .  omeprazole (PRILOSEC) 40 MG capsule, TAKE 1 CAPSULE BY MOUTH EVERY DAY BEFORE A MEAL, Disp: 90 capsule, Rfl: 1 .  telmisartan-hydrochlorothiazide (MICARDIS HCT) 40-12.5 MG tablet, Take 1 tablet by mouth daily., Disp: 90 tablet, Rfl: 1 .  Turmeric 500 MG CAPS, Take 1 capsule by mouth., Disp: , Rfl:  .  Insulin Degludec-Liraglutide (XULTOPHY) 100-3.6 UNIT-MG/ML SOPN, Inject 25 mg into the skin daily., Disp: 9 pen, Rfl: 2 .  meloxicam (MOBIC) 7.5 MG tablet, TAKE 1 TABLET(7.5 MG) BY MOUTH TWICE DAILY AS NEEDED FOR PAIN, Disp: 180 tablet, Rfl: 0 .  pneumococcal 13-valent conjugate vaccine (PREVNAR 13) SUSP injection, Inject 0.5 mLs into the muscle tomorrow at 10 am for 1 dose., Disp: 0.5 mL, Rfl: 0   No Known Allergies   Review of Systems  Constitutional: Negative.   Eyes:  Negative for blurred vision.  Respiratory: Negative.   Cardiovascular: Negative.  Negative for chest pain.  Gastrointestinal: Negative.   Neurological: Negative.  Negative for headaches.  Psychiatric/Behavioral: Negative.      Today's Vitals   02/14/19 1410  BP: 126/74  Pulse: 79  Temp: 98.4 F (36.9 C)  TempSrc: Oral  Weight: 179 lb 12.8 oz (81.6 kg)  Height: '5\' 4"'  (1.626 m)  PainSc: 0-No pain   Body mass index is 30.86 kg/m.   Objective:  Physical Exam Vitals signs and nursing note reviewed.  Constitutional:      Appearance: Normal appearance.  HENT:     Head: Normocephalic and atraumatic.  Neck:     Musculoskeletal: Muscular tenderness present.     Comments: Bilateral trapezius muscles tender to deep palpation Cardiovascular:     Rate and Rhythm: Normal rate and regular rhythm.     Heart sounds: Normal heart sounds.  Pulmonary:     Effort: Pulmonary effort is normal.     Breath sounds: Normal breath sounds.  Skin:    General: Skin is warm.  Neurological:     General: No focal deficit present.     Mental Status: She is alert.  Psychiatric:        Mood and Affect: Mood normal.        Behavior: Behavior normal.         Assessment And Plan:     1. Diabetes mellitus with stage 2 chronic kidney disease ([place])  I will check labs as listed below. Importance of regular exercise was discussed with the patient. She is encouraged to exercise 30 minutes five days weekly. She will rto in four months for re-evaluation.   - Hemoglobin A1c - BMP8+EGFR  2. Hypertensive nephropathy  Chronic, well controlled. She will continue with current meds. She is encouraged to avoid adding salt to her foods.   3. Cervicalgia  She is advised to apply topical pain cream, or mentholated cream (I.e. Vicks Vaporub) to affected area nightly as needed. She is also advised to start magnesium nightly.   4. Immunization due  Rx Prevnar-13 was sent to the pharmacy.   5. Class 1 obesity  due to excess calories with serious comorbidity and body mass index (BMI) of 30.0 to 30.9 in adult  Importance of achieving optimal weight to decrease risk of cardiovascular disease and cancers was discussed with the patient in full detail. She is encouraged to start slowly - start with 10 minutes twice daily at least three to four days per week and to gradually build to 30 minutes five days weekly. She was given tips to incorporate more activity into her daily routine - take stairs when possible, park farther away from her grocery stores, etc.    Maximino Greenland, MD    THE PATIENT IS ENCOURAGED TO PRACTICE SOCIAL DISTANCING DUE TO THE COVID-19 PANDEMIC.

## 2019-02-14 NOTE — Patient Instructions (Addendum)
Calm, magnesium supplement You may take this nightly - good for your heart, blood pressure and muscle aches Also good for bone health   Diabetes Mellitus and Exercise Exercising regularly is important for your overall health, especially when you have diabetes (diabetes mellitus). Exercising is not only about losing weight. It has many other health benefits, such as increasing muscle strength and bone density and reducing body fat and stress. This leads to improved fitness, flexibility, and endurance, all of which result in better overall health. Exercise has additional benefits for people with diabetes, including:  Reducing appetite.  Helping to lower and control blood glucose.  Lowering blood pressure.  Helping to control amounts of fatty substances (lipids) in the blood, such as cholesterol and triglycerides.  Helping the body to respond better to insulin (improving insulin sensitivity).  Reducing how much insulin the body needs.  Decreasing the risk for heart disease by: ? Lowering cholesterol and triglyceride levels. ? Increasing the levels of good cholesterol. ? Lowering blood glucose levels. What is my activity plan? Your health care provider or certified diabetes educator can help you make a plan for the type and frequency of exercise (activity plan) that works for you. Make sure that you:  Do at least 150 minutes of moderate-intensity or vigorous-intensity exercise each week. This could be brisk walking, biking, or water aerobics. ? Do stretching and strength exercises, such as yoga or weightlifting, at least 2 times a week. ? Spread out your activity over at least 3 days of the week.  Get some form of physical activity every day. ? Do not go more than 2 days in a row without some kind of physical activity. ? Avoid being inactive for more than 30 minutes at a time. Take frequent breaks to walk or stretch.  Choose a type of exercise or activity that you enjoy, and set  realistic goals.  Start slowly, and gradually increase the intensity of your exercise over time. What do I need to know about managing my diabetes?   Check your blood glucose before and after exercising. ? If your blood glucose is 240 mg/dL (13.3 mmol/L) or higher before you exercise, check your urine for ketones. If you have ketones in your urine, do not exercise until your blood glucose returns to normal. ? If your blood glucose is 100 mg/dL (5.6 mmol/L) or lower, eat a snack containing 15-20 grams of carbohydrate. Check your blood glucose 15 minutes after the snack to make sure that your level is above 100 mg/dL (5.6 mmol/L) before you start your exercise.  Know the symptoms of low blood glucose (hypoglycemia) and how to treat it. Your risk for hypoglycemia increases during and after exercise. Common symptoms of hypoglycemia can include: ? Hunger. ? Anxiety. ? Sweating and feeling clammy. ? Confusion. ? Dizziness or feeling light-headed. ? Increased heart rate or palpitations. ? Blurry vision. ? Tingling or numbness around the mouth, lips, or tongue. ? Tremors or shakes. ? Irritability.  Keep a rapid-acting carbohydrate snack available before, during, and after exercise to help prevent or treat hypoglycemia.  Avoid injecting insulin into areas of the body that are going to be exercised. For example, avoid injecting insulin into: ? The arms, when playing tennis. ? The legs, when jogging.  Keep records of your exercise habits. Doing this can help you and your health care provider adjust your diabetes management plan as needed. Write down: ? Food that you eat before and after you exercise. ? Blood glucose levels before  and after you exercise. ? The type and amount of exercise you have done. ? When your insulin is expected to peak, if you use insulin. Avoid exercising at times when your insulin is peaking.  When you start a new exercise or activity, work with your health care  provider to make sure the activity is safe for you, and to adjust your insulin, medicines, or food intake as needed.  Drink plenty of water while you exercise to prevent dehydration or heat stroke. Drink enough fluid to keep your urine clear or pale yellow. Summary  Exercising regularly is important for your overall health, especially when you have diabetes (diabetes mellitus).  Exercising has many health benefits, such as increasing muscle strength and bone density and reducing body fat and stress.  Your health care provider or certified diabetes educator can help you make a plan for the type and frequency of exercise (activity plan) that works for you.  When you start a new exercise or activity, work with your health care provider to make sure the activity is safe for you, and to adjust your insulin, medicines, or food intake as needed. This information is not intended to replace advice given to you by your health care provider. Make sure you discuss any questions you have with your health care provider. Document Released: 06/12/2003 Document Revised: 10/14/2016 Document Reviewed: 09/01/2015 Elsevier Patient Education  2020 [employer].

## 2019-02-15 LAB — BMP8+EGFR
BUN/Creatinine Ratio: 21 (ref 12–28)
BUN: 18 mg/dL (ref 8–27)
CO2: 20 mmol/L (ref 20–29)
Calcium: 9.7 mg/dL (ref 8.7–10.3)
Chloride: 108 mmol/L — ABNORMAL HIGH (ref 96–106)
Creatinine, Ser: 0.87 mg/dL (ref 0.57–1.00)
GFR calc Af Amer: 80 mL/min/{1.73_m2} (ref >59)
GFR calc non Af Amer: 69 mL/min/{1.73_m2} (ref >59)
Glucose: 109 mg/dL — ABNORMAL HIGH (ref 65–99)
Potassium: 4.1 mmol/L (ref 3.5–5.2)
Sodium: 142 mmol/L (ref 134–144)

## 2019-02-15 LAB — HEMOGLOBIN A1C
Est. average glucose Bld gHb Est-mCnc: 151 mg/dL
Hgb A1c MFr Bld: 6.9 % — ABNORMAL HIGH (ref 4.8–5.6)

## 2019-02-17 IMAGING — US US EXTREM LOW VENOUS*L*
1 series · 13 of 24 positions shown · non-contrast
Comparison: None.

CLINICAL DATA: 66-year-old female with left lower extremity edema



[Series 1: us extrem low venous*left* · 0.07mm/px · 13 of 38 slices shown]
[im 1/38]
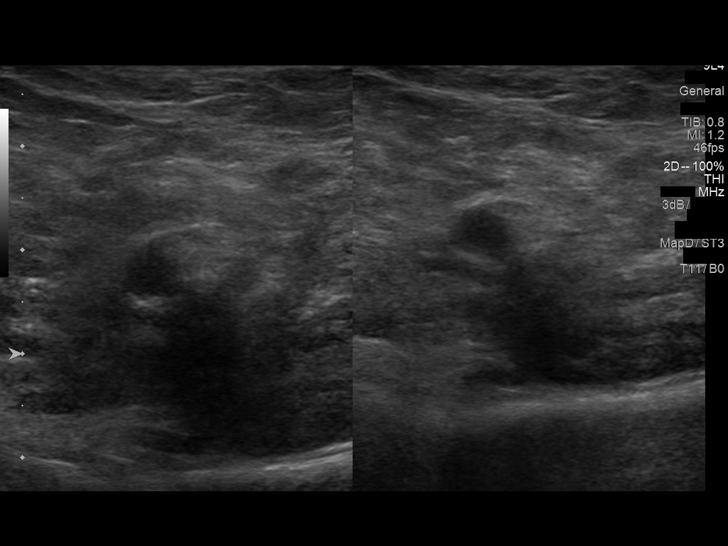
[im 4/38]
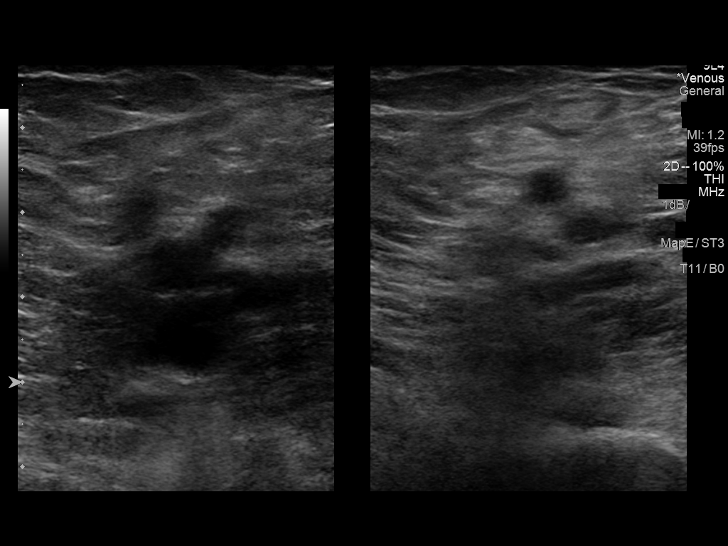
[im 7/38]
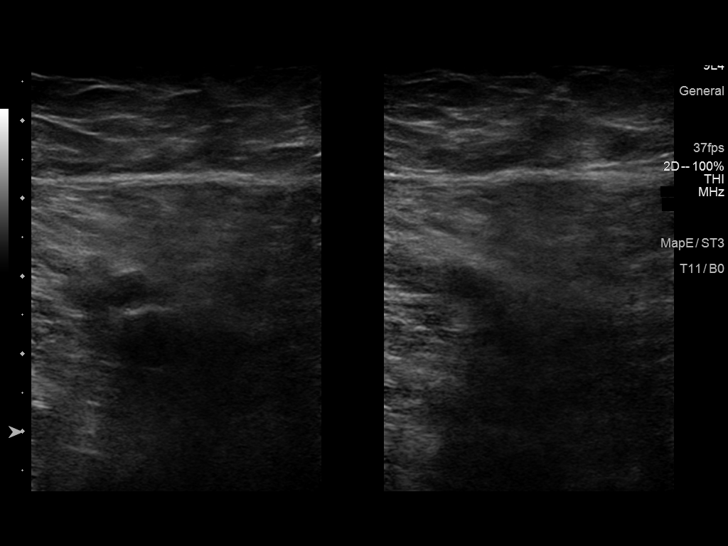
[im 10/38]
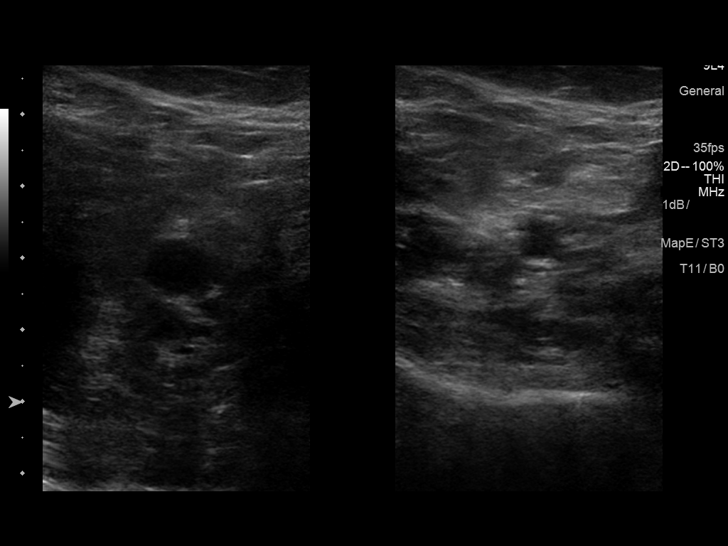
[im 13/38]
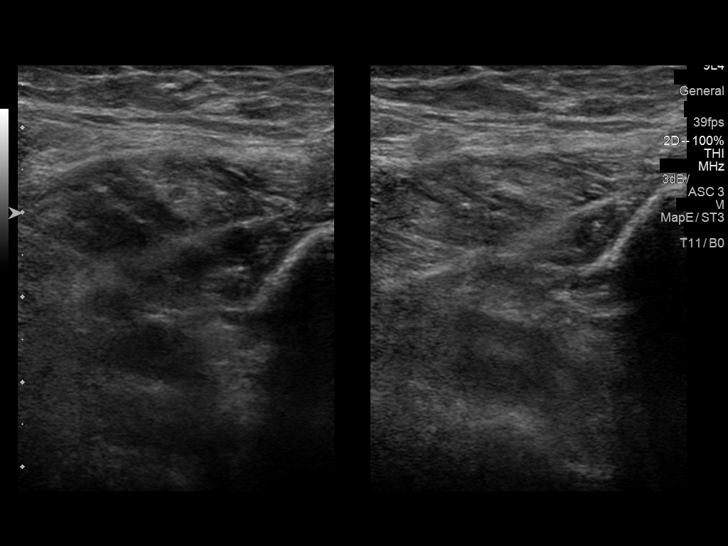
[im 17/38]
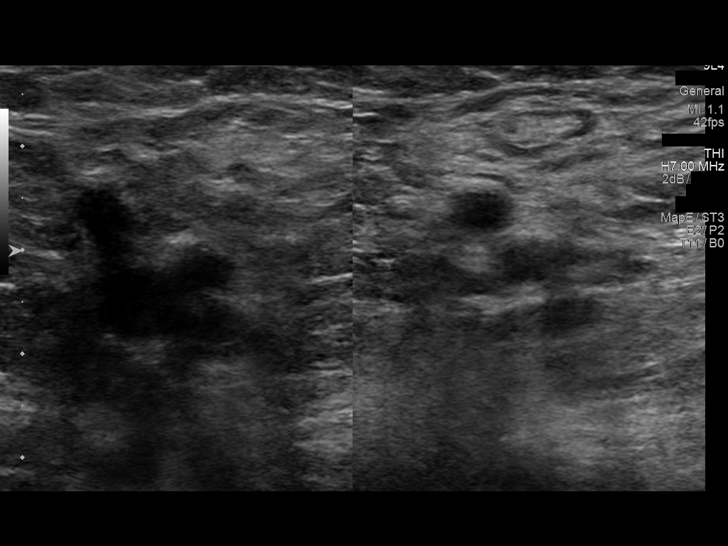
[im 20/38]
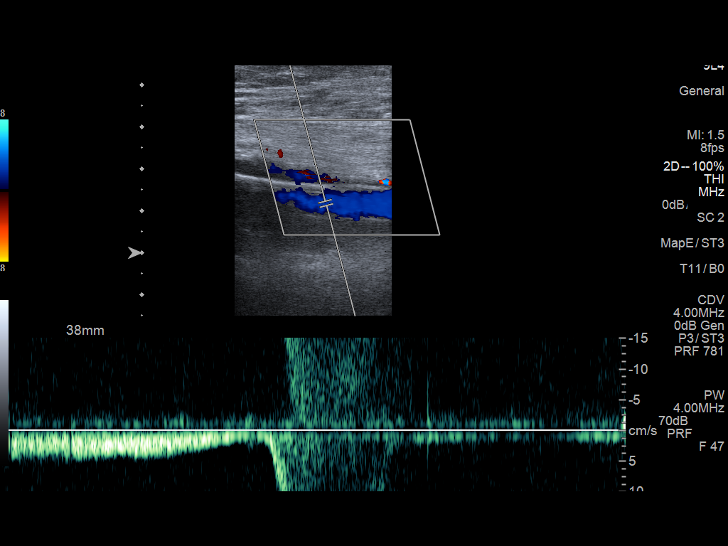
[im 21/38]
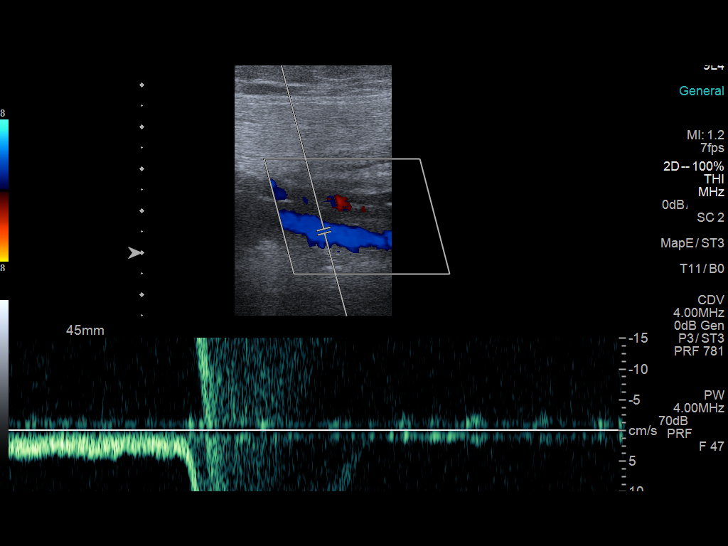
[im 25/38]
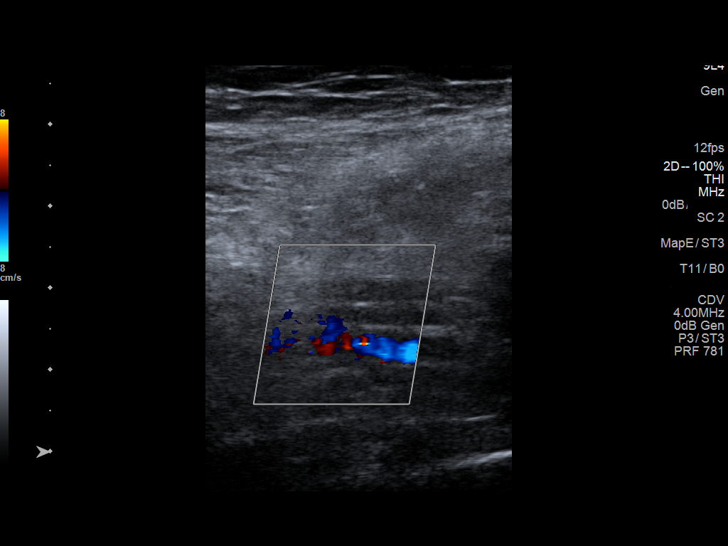
[im 28/38]
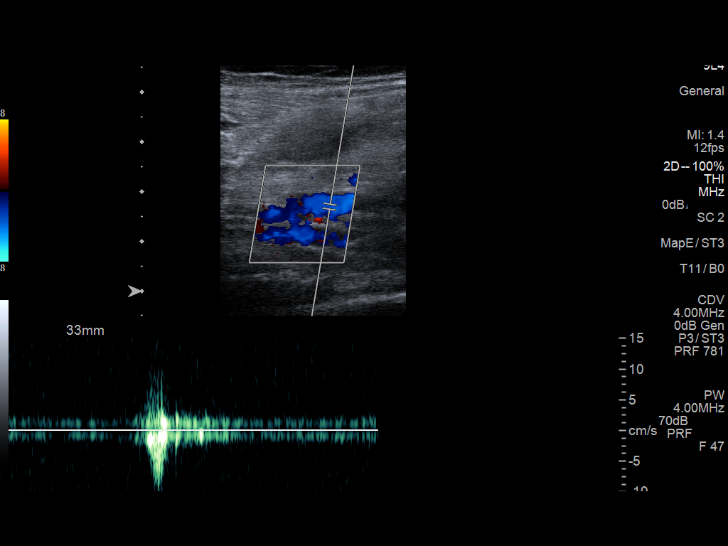
[im 31/38]
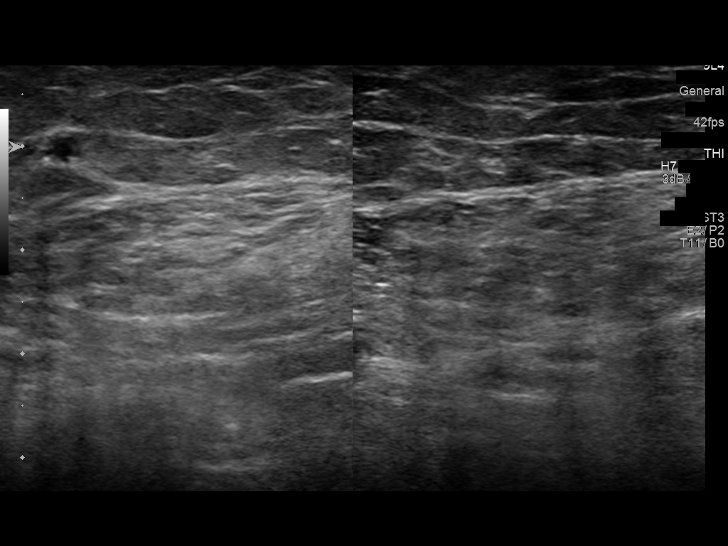
[im 34/38]
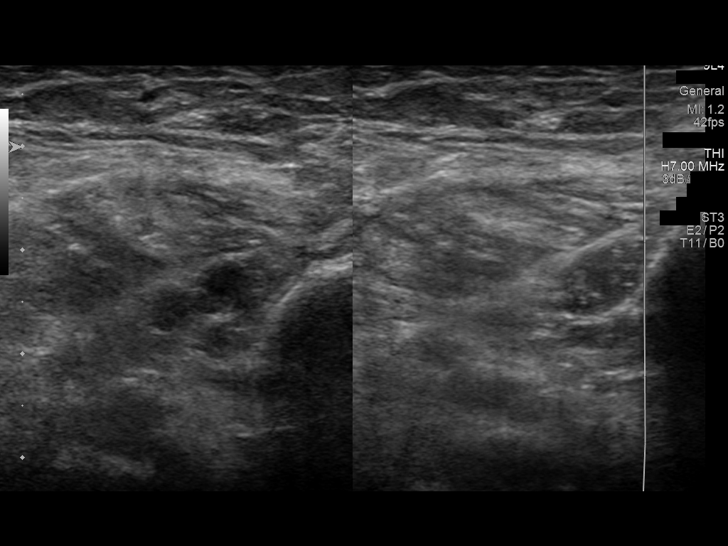
[im 38/38]
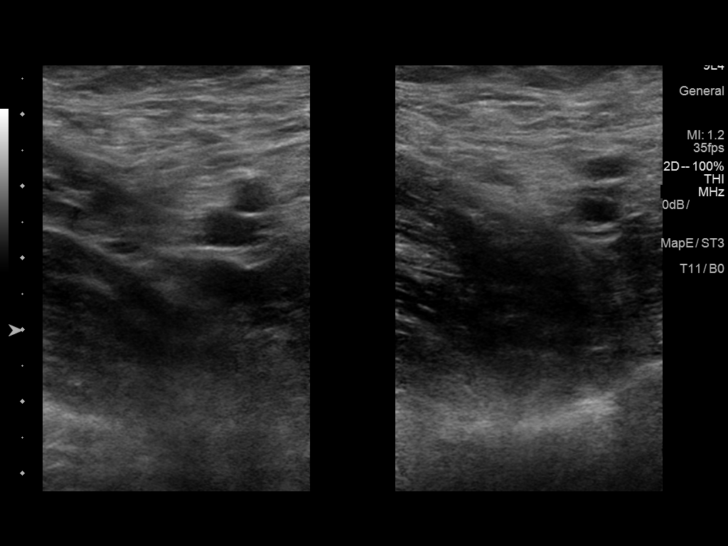

[13 of 24 positions shown; findings below may reference images not displayed]

FINDINGS: Contralateral Common Femoral Vein: Respiratory phasicity is normal
and symmetric with the symptomatic side. No evidence of thrombus.
Normal compressibility.

Common Femoral Vein: No evidence of thrombus. Normal
compressibility, respiratory phasicity and response to augmentation.

Saphenofemoral Junction: No evidence of thrombus. Normal
compressibility and flow on color Doppler imaging.

Profunda Femoral Vein: No evidence of thrombus. Normal
compressibility and flow on color Doppler imaging.

Femoral Vein: No evidence of thrombus. Normal compressibility,
respiratory phasicity and response to augmentation.

Popliteal Vein: No evidence of thrombus. Normal compressibility,
respiratory phasicity and response to augmentation.

Calf Veins: No evidence of thrombus. Normal compressibility and flow
on color Doppler imaging.

Superficial Great Saphenous Vein: No evidence of thrombus. Normal
compressibility.

Venous Reflux:  None.

Other Findings:  None.
IMPRESSION: No evidence of deep venous thrombosis.

## 2019-02-20 ENCOUNTER — Other Ambulatory Visit: Payer: Self-pay

## 2019-02-20 MED ORDER — XULTOPHY 100-3.6 UNIT-MG/ML ~~LOC~~ SOPN: 25.0000 [IU] | PEN_INJECTOR | Freq: Every day | SUBCUTANEOUS | 2 refills | Status: DC

## 2019-02-20 MED ORDER — BD PEN NEEDLE SHORT U/F 31G X 8 MM MISC: 11 refills | Status: DC

## 2019-02-23 ENCOUNTER — Telehealth: Payer: Self-pay

## 2019-02-23 NOTE — Telephone Encounter (Signed)
The pt was asked if she takes her meloxicam medication daily and the pt said no she only takes it maybe once per week and only if needed.

## 2019-02-28 DIAGNOSIS — G4733 Obstructive sleep apnea (adult) (pediatric): Secondary | ICD-10-CM | POA: Diagnosis not present

## 2019-03-30 DIAGNOSIS — G4733 Obstructive sleep apnea (adult) (pediatric): Secondary | ICD-10-CM | POA: Diagnosis not present

## 2019-04-23 ENCOUNTER — Telehealth: Payer: Self-pay

## 2019-04-23 NOTE — Telephone Encounter (Signed)
Pt called asking about the COVID shot with her having diabetes is it ok to go ahead and schedule it or should she wait?

## 2019-04-23 NOTE — Telephone Encounter (Signed)
Pt LVM had a question about the COVID shot asked if someone can call her LVM for pt to call the office back w/her question

## 2019-04-23 NOTE — Telephone Encounter (Signed)
She should schedule to receive the vaccine, especially because she has diabetes.

## 2019-04-24 ENCOUNTER — Telehealth: Payer: Self-pay

## 2019-04-24 NOTE — Telephone Encounter (Signed)
Per RS: She should schedule to receive the vaccine, especially because she has diabetes.

## 2019-04-27 DIAGNOSIS — G4733 Obstructive sleep apnea (adult) (pediatric): Secondary | ICD-10-CM | POA: Diagnosis not present

## 2019-06-19 ENCOUNTER — Ambulatory Visit: Payer: Medicare Other | Admitting: Internal Medicine

## 2019-06-20 ENCOUNTER — Ambulatory Visit (INDEPENDENT_AMBULATORY_CARE_PROVIDER_SITE_OTHER): Payer: Medicare Other

## 2019-06-20 ENCOUNTER — Ambulatory Visit (INDEPENDENT_AMBULATORY_CARE_PROVIDER_SITE_OTHER): Payer: Medicare Other | Admitting: Internal Medicine

## 2019-06-20 ENCOUNTER — Encounter: Payer: Self-pay | Admitting: Internal Medicine

## 2019-06-20 ENCOUNTER — Other Ambulatory Visit: Payer: Self-pay

## 2019-06-20 VITALS — BP 126/78 | HR 94 | Temp 97.9°F | Ht 64.0 in | Wt 178.4 lb

## 2019-06-20 VITALS — BP 126/78 | HR 94 | Temp 97.9°F | Ht 64.0 in | Wt 178.0 lb

## 2019-06-20 DIAGNOSIS — N182 Chronic kidney disease, stage 2 (mild): Secondary | ICD-10-CM

## 2019-06-20 DIAGNOSIS — Z87891 Personal history of nicotine dependence: Secondary | ICD-10-CM

## 2019-06-20 DIAGNOSIS — E1122 Type 2 diabetes mellitus with diabetic chronic kidney disease: Secondary | ICD-10-CM

## 2019-06-20 DIAGNOSIS — R040 Epistaxis: Secondary | ICD-10-CM

## 2019-06-20 DIAGNOSIS — Z0000 Encounter for general adult medical examination without abnormal findings: Secondary | ICD-10-CM

## 2019-06-20 DIAGNOSIS — I129 Hypertensive chronic kidney disease with stage 1 through stage 4 chronic kidney disease, or unspecified chronic kidney disease: Secondary | ICD-10-CM

## 2019-06-20 NOTE — Patient Instructions (Signed)
April Larson , Thank you for taking time to come for your Medicare Wellness Visit. I appreciate your ongoing commitment to your health goals. Please review the following plan we discussed and let me know if I can assist you in the future.   Screening recommendations/referrals: Colonoscopy: 09/2014 Mammogram: 01/2019 Bone Density: 01/2019 Recommended yearly ophthalmology/optometry visit for glaucoma screening and checkup Recommended yearly dental visit for hygiene and checkup  Vaccinations: Influenza vaccine: 12/2018 Pneumococcal vaccine: 10/2018 Tdap vaccine: 09/2015 Shingles vaccine: 11/2018    Advanced directives: Advance directive discussed with you today. I have provided a copy for you to complete at home and have notarized. Once this is complete please bring a copy in to our office so we can scan it into your chart.   Conditions/risks identified: obesity  Next appointment: 10/23/2019 at 2:15   Preventive Care 68 Years and Older, Female Preventive care refers to lifestyle choices and visits with your health care provider that can promote health and wellness. What does preventive care include?  A yearly physical exam. This is also called an annual well check.  Dental exams once or twice a year.  Routine eye exams. Ask your health care provider how often you should have your eyes checked.  Personal lifestyle choices, including:  Daily care of your teeth and gums.  Regular physical activity.  Eating a healthy diet.  Avoiding tobacco and drug use.  Limiting alcohol use.  Practicing safe sex.  Taking low-dose aspirin every day.  Taking vitamin and mineral supplements as recommended by your health care provider. What happens during an annual well check? The services and screenings done by your health care provider during your annual well check will depend on your age, overall health, lifestyle risk factors, and family history of disease. Counseling  Your health  care provider may ask you questions about your:  Alcohol use.  Tobacco use.  Drug use.  Emotional well-being.  Home and relationship well-being.  Sexual activity.  Eating habits.  History of falls.  Memory and ability to understand (cognition).  Work and work Statistician.  Reproductive health. Screening  You may have the following tests or measurements:  Height, weight, and BMI.  Blood pressure.  Lipid and cholesterol levels. These may be checked every 5 years, or more frequently if you are over 71 years old.  Skin check.  Lung cancer screening. You may have this screening every year starting at age 66 if you have a 30-pack-year history of smoking and currently smoke or have quit within the past 15 years.  Fecal occult blood test (FOBT) of the stool. You may have this test every year starting at age 50.  Flexible sigmoidoscopy or colonoscopy. You may have a sigmoidoscopy every 5 years or a colonoscopy every 10 years starting at age 34.  Hepatitis C blood test.  Hepatitis B blood test.  Sexually transmitted disease (STD) testing.  Diabetes screening. This is done by checking your blood sugar (glucose) after you have not eaten for a while (fasting). You may have this done every 1-3 years.  Bone density scan. This is done to screen for osteoporosis. You may have this done starting at age 1.  Mammogram. This may be done every 1-2 years. Talk to your health care provider about how often you should have regular mammograms. Talk with your health care provider about your test results, treatment options, and if necessary, the need for more tests. Vaccines  Your health care provider may recommend certain vaccines, such as:  Influenza vaccine. This is recommended every year.  Tetanus, diphtheria, and acellular pertussis (Tdap, Td) vaccine. You may need a Td booster every 10 years.  Zoster vaccine. You may need this after age 19.  Pneumococcal 13-valent conjugate  (PCV13) vaccine. One dose is recommended after age 33.  Pneumococcal polysaccharide (PPSV23) vaccine. One dose is recommended after age 84. Talk to your health care provider about which screenings and vaccines you need and how often you need them. This information is not intended to replace advice given to you by your health care provider. Make sure you discuss any questions you have with your health care provider. Document Released: 04/18/2015 Document Revised: 12/10/2015 Document Reviewed: 01/21/2015 Elsevier Interactive Patient Education  2017 [employer] Prevention in the Home Falls can cause injuries. They can happen to people of all ages. There are many things you can do to make your home safe and to help prevent falls. What can I do on the outside of my home?  Regularly fix the edges of walkways and driveways and fix any cracks.  Remove anything that might make you trip as you walk through a door, such as a raised step or threshold.  Trim any bushes or trees on the path to your home.  Use bright outdoor lighting.  Clear any walking paths of anything that might make someone trip, such as rocks or tools.  Regularly check to see if handrails are loose or broken. Make sure that both sides of any steps have handrails.  Any raised decks and porches should have guardrails on the edges.  Have any leaves, snow, or ice cleared regularly.  Use sand or salt on walking paths during winter.  Clean up any spills in your garage right away. This includes oil or grease spills. What can I do in the bathroom?  Use night lights.  Install grab bars by the toilet and in the tub and shower. Do not use towel bars as grab bars.  Use non-skid mats or decals in the tub or shower.  If you need to sit down in the shower, use a plastic, non-slip stool.  Keep the floor dry. Clean up any water that spills on the floor as soon as it happens.  Remove soap buildup in the tub or shower  regularly.  Attach bath mats securely with double-sided non-slip rug tape.  Do not have throw rugs and other things on the floor that can make you trip. What can I do in the bedroom?  Use night lights.  Make sure that you have a light by your bed that is easy to reach.  Do not use any sheets or blankets that are too big for your bed. They should not hang down onto the floor.  Have a firm chair that has side arms. You can use this for support while you get dressed.  Do not have throw rugs and other things on the floor that can make you trip. What can I do in the kitchen?  Clean up any spills right away.  Avoid walking on wet floors.  Keep items that you use a lot in easy-to-reach places.  If you need to reach something above you, use a strong step stool that has a grab bar.  Keep electrical cords out of the way.  Do not use floor polish or wax that makes floors slippery. If you must use wax, use non-skid floor wax.  Do not have throw rugs and other things on the floor that  can make you trip. What can I do with my stairs?  Do not leave any items on the stairs.  Make sure that there are handrails on both sides of the stairs and use them. Fix handrails that are broken or loose. Make sure that handrails are as long as the stairways.  Check any carpeting to make sure that it is firmly attached to the stairs. Fix any carpet that is loose or worn.  Avoid having throw rugs at the top or bottom of the stairs. If you do have throw rugs, attach them to the floor with carpet tape.  Make sure that you have a light switch at the top of the stairs and the bottom of the stairs. If you do not have them, ask someone to add them for you. What else can I do to help prevent falls?  Wear shoes that:  Do not have high heels.  Have rubber bottoms.  Are comfortable and fit you well.  Are closed at the toe. Do not wear sandals.  If you use a stepladder:  Make sure that it is fully  opened. Do not climb a closed stepladder.  Make sure that both sides of the stepladder are locked into place.  Ask someone to hold it for you, if possible.  Clearly mark and make sure that you can see:  Any grab bars or handrails.  First and last steps.  Where the edge of each step is.  Use tools that help you move around (mobility aids) if they are needed. These include:  Canes.  Walkers.  Scooters.  Crutches.  Turn on the lights when you go into a dark area. Replace any light bulbs as soon as they burn out.  Set up your furniture so you have a clear path. Avoid moving your furniture around.  If any of your floors are uneven, fix them.  If there are any pets around you, be aware of where they are.  Review your medicines with your doctor. Some medicines can make you feel dizzy. This can increase your chance of falling. Ask your doctor what other things that you can do to help prevent falls. This information is not intended to replace advice given to you by your health care provider. Make sure you discuss any questions you have with your health care provider. Document Released: 01/16/2009 Document Revised: 08/28/2015 Document Reviewed: 04/26/2014 Elsevier Interactive Patient Education  2017 [employer].

## 2019-06-20 NOTE — Progress Notes (Signed)
This visit occurred during the SARS-CoV-2 public health emergency.  Safety protocols were in place, including screening questions prior to the visit, additional usage of staff PPE, and extensive cleaning of exam room while observing appropriate contact time as indicated for disinfecting solutions.  Subjective:   April Larson is a 69 y.o. female who presents for Medicare Annual (Subsequent) preventive examination.  Review of Systems:  n/a Cardiac Risk Factors include: advanced age (>10men, >66 women);diabetes mellitus;hypertension;obesity (BMI >30kg/m2)     Objective:     Vitals: BP 126/78 (Patient Position: Sitting)   Pulse 94   Temp 97.9 F (36.6 C) (Oral)   Ht 5\' 4"  (1.626 m)   Wt 178 lb (80.7 kg)   BMI 30.55 kg/m   Body mass index is 30.55 kg/m.  Advanced Directives 06/20/2019 07/26/2018 04/05/2016 09/18/2015  Does Patient Have a Medical Advance Directive? No No No No  Would patient like information on creating a medical advance directive? Yes (MAU/Ambulatory/Procedural Areas - Information given) No - Patient declined - -    Tobacco Social History   Tobacco Use  Smoking Status Former Smoker  . Packs/day: 1.00  . Years: 30.00  . Pack years: 30.00  . Types: Cigarettes  . Quit date: 2004  . Years since quitting: 17.2  Smokeless Tobacco Never Used     Counseling given: Not Answered   Clinical Intake:  Pre-visit preparation completed: Yes  Pain : No/denies pain     Nutritional Status: BMI > 30  Obese Nutritional Risks: None Diabetes: Yes  How often do you need to have someone help you when you read instructions, pamphlets, or other written materials from your doctor or pharmacy?: 1 - Never What is the last grade level you completed in school?: bs and associates degree  Interpreter Needed?: No  Information entered by :: NAllen LPN  Past Medical History:  Diagnosis Date  . Diabetes mellitus without complication ([place])   . Hypertension   .  Substance abuse [facility])    Past Surgical History:  Procedure Laterality Date  . EYE SURGERY     Family History  Problem Relation Age of Onset  . Cancer Mother   . Diabetes Father   . Cancer Brother   . Breast cancer Maternal Aunt    Social History   Socioeconomic History  . Marital status: Widowed    Spouse name: Not on file  . Number of children: Not on file  . Years of education: Not on file  . Highest education level: Not on file  Occupational History  . Occupation: retired  Tobacco Use  . Smoking status: Former Smoker    Packs/day: 1.00    Years: 30.00    Pack years: 30.00    Types: Cigarettes    Quit date: 2004    Years since quitting: 17.2  . Smokeless tobacco: Never Used  Substance and Sexual Activity  . Alcohol use: No    Alcohol/week: 0.0 standard drinks  . Drug use: No  . Sexual activity: Not Currently  Other Topics Concern  . Not on file  Social History Narrative  . Not on file   Social Determinants of Health   Financial Resource Strain: Low Risk   . Difficulty of Paying Living Expenses: Not hard at all  Food Insecurity: No Food Insecurity  . Worried About Charity fundraiser in the Last Year: Never true  . Ran Out of Food in the Last Year: Never true  Transportation Needs: No Transportation Needs  .  Lack of Transportation (Medical): No  . Lack of Transportation (Non-Medical): No  Physical Activity: Insufficiently Active  . Days of Exercise per Week: 7 days  . Minutes of Exercise per Session: 20 min  Stress: No Stress Concern Present  . Feeling of Stress : Not at all  Social Connections:   . Frequency of Communication with Friends and Family:   . Frequency of Social Gatherings with Friends and Family:   . Attends Religious Services:   . Active Member of Clubs or Organizations:   . Attends Archivist Meetings:   Marland Kitchen Marital Status:     Outpatient Encounter Medications as of 06/20/2019  Medication Sig  . ACCU-CHEK FASTCLIX LANCETS MISC  USE TO CHECK BLOOD SUGARS TWICE DAILY  . ACCU-CHEK GUIDE test strip USE TO CHECK BLOOD SUGARS TWICE DAILY AS DIRECTED  . Ascorbic Acid (VITAMIN C PO) Take 1 tablet by mouth daily.  Marland Kitchen aspirin EC 81 MG tablet Take 81 mg by mouth daily.  . Biotin 10 MG CAPS Take 1 tablet by mouth daily.  . cetirizine (ZYRTEC) 10 MG tablet Take 10 mg by mouth at bedtime.  . cholecalciferol (VITAMIN D) 400 UNITS TABS Take by mouth daily. 5,000 units  . fluticasone (FLONASE) 50 MCG/ACT nasal spray SHAKE LIQUID AND USE 2 SPRAYS IN EACH NOSTRIL DAILY (Patient not taking: Reported on 06/20/2019)  . Insulin Degludec-Liraglutide (XULTOPHY) 100-3.6 UNIT-MG/ML SOPN Inject 25 Units into the skin daily.  . Insulin Pen Needle (B-D ULTRAFINE III SHORT PEN) 31G X 8 MM MISC USE AS DIRECTED TWICE DAILY  . LIVALO 4 MG TABS TAKE 1 TABLET BY MOUTH DAILY  . loratadine (CLARITIN) 10 MG tablet Take 10 mg by mouth daily.  . metFORMIN (GLUCOPHAGE) 500 MG tablet TAKE 1 TABLET BY MOUTH TWICE DAILY( EVERY TWELVE HOURS) (Patient taking differently: Take 500 mg by mouth daily with breakfast. )  . Multiple Vitamins-Minerals (ONE-A-DAY WOMENS 50+ ADVANTAGE PO) Take 1 tablet by mouth daily.  Marland Kitchen omeprazole (PRILOSEC) 40 MG capsule TAKE 1 CAPSULE BY MOUTH EVERY DAY BEFORE A MEAL  . telmisartan-hydrochlorothiazide (MICARDIS HCT) 40-12.5 MG tablet Take 1 tablet by mouth daily.  . Turmeric 500 MG CAPS Take 1 capsule by mouth.   No facility-administered encounter medications on file as of 06/20/2019.    Activities of Daily Living In your present state of health, do you have any difficulty performing the following activities: 06/20/2019 07/26/2018  Hearing? Y N  Comment reads lips, right ear decreased hearing -  Vision? N N  Difficulty concentrating or making decisions? N N  Walking or climbing stairs? N N  Dressing or bathing? N N  Doing errands, shopping? N N  Preparing Food and eating ? N N  Using the Toilet? N N  In the past six months, have  you accidently leaked urine? N N  Do you have problems with loss of bowel control? N N  Managing your Medications? N N  Managing your Finances? N N  Housekeeping or managing your Housekeeping? N N  Some recent data might be hidden    Patient Care Team: Glendale Chard, MD as PCP - General (Internal Medicine) Warden Fillers, MD as Consulting Physician (Ophthalmology)    Assessment:   This is a routine wellness examination for April Larson.  Exercise Activities and Dietary recommendations Current Exercise Habits: Home exercise routine, Type of exercise: walking, Time (Minutes): 20, Frequency (Times/Week): 7, Weekly Exercise (Minutes/Week): 140  Goals    . Patient Stated  06/20/2019, no goals     . Weight (lb) < 200 lb (90.7 kg) (pt-stated)     Wants to get 170 pounds       Fall Risk Fall Risk  06/20/2019 10/14/2018 10/12/2018 07/26/2018 01/12/2018  Falls in the past year? 1 1 1 1  No  Comment missed a chair - - - -  Number falls in past yr: 0 0 0 0 -  Comment - states she fell while trying to sit in chair two weeks ago. Hurt R shoulder. Did not seek medical attention. - missed chair -  Injury with Fall? 0 1 0 1 -  Comment - she hurt R shoulder. Sx have since resolved. Did not seek medical attention - hurt right shoulder -  Risk for fall due to : History of fall(s);Medication side effect History of fall(s) - History of fall(s);Medication side effect -  Follow up Falls evaluation completed;Education provided;Falls prevention discussed Falls evaluation completed;Education provided;Falls prevention discussed - Falls prevention discussed -   Is the patient's home free of loose throw rugs in walkways, pet beds, electrical cords, etc?   yes      Grab bars in the bathroom? no      Handrails on the stairs?   yes      Adequate lighting?   yes  Timed Get Up and Go performed: n/a  Depression Screen PHQ 2/9 Scores 06/20/2019 10/12/2018 07/26/2018 01/12/2018  PHQ - 2 Score 0 0 0 0  PHQ- 9  Score 0 - 0 -     Cognitive Function     6CIT Screen 06/20/2019 07/26/2018  What Year? 0 points 0 points  What month? 0 points 0 points  What time? 0 points 0 points  Count back from 20 0 points 0 points  Months in reverse 0 points 0 points  Repeat phrase 0 points 0 points  Total Score 0 0    Immunization History  Administered Date(s) Administered  . Fluad Quad(high Dose 65+) 01/01/2019  . Influenza, High Dose Seasonal PF 01/12/2018, 01/01/2019  . PFIZER SARS-COV-2 Vaccination 04/26/2019, 05/18/2019  . Pneumococcal Conjugate-13 10/13/2018  . Pneumococcal Polysaccharide-23 03/02/2017  . Tdap 09/18/2015  . Zoster Recombinat (Shingrix) 11/22/2018    Qualifies for Shingles Vaccine? yes  Screening Tests Health Maintenance  Topic Date Due  . HEMOGLOBIN A1C  08/14/2019  . OPHTHALMOLOGY EXAM  10/11/2019  . FOOT EXAM  10/12/2019  . MAMMOGRAM  01/14/2021  . COLONOSCOPY  09/17/2024  . TETANUS/TDAP  09/17/2025  . INFLUENZA VACCINE  Completed  . DEXA SCAN  Completed  . Hepatitis C Screening  Completed  . PNA vac Low Risk Adult  Completed    Cancer Screenings: Lung: Low Dose CT Chest recommended if Age 29-80 years, 30 pack-year currently smoking OR have quit w/in 15years. Patient does not qualify. Breast:  Up to date on Mammogram? Yes   Up to date of Bone Density/Dexa? Yes Colorectal: up to date  Additional Screenings: : Hepatitis C Screening: 08/11/2012     Plan:    Patient has no goals set at this time.   I have personally reviewed and noted the following in the patient's chart:   . Medical and social history . Use of alcohol, tobacco or illicit drugs  . Current medications and supplements . Functional ability and status . Nutritional status . Physical activity . Advanced directives . List of other physicians . Hospitalizations, surgeries, and ER visits in previous 12 months . Vitals . Screenings to include cognitive, depression,  and falls . Referrals and  appointments  In addition, I have reviewed and discussed with patient certain preventive protocols, quality metrics, and best practice recommendations. A written personalized care plan for preventive services as well as general preventive health recommendations were provided to patient.     Kellie Simmering, LPN  [phone]

## 2019-06-20 NOTE — Patient Instructions (Signed)
Nosebleed, Adult A nosebleed is when blood comes out of the nose. Nosebleeds are common. Usually, they are not a sign of a serious condition. Nosebleeds can happen if a small blood vessel in your nose starts to bleed or if the lining of your nose (mucous membrane) cracks. They are commonly caused by:  Allergies.  Colds.  Picking your nose.  Blowing your nose too hard.  An injury from sticking an object into your nose or getting hit in the nose.  Dry or cold air. Less common causes of nosebleeds include:  Toxic fumes.  Something abnormal in the nose or in the air-filled spaces in the bones of the face (sinuses).  Growths in the nose, such as polyps.  Medicines or conditions that cause blood to clot slowly.  Certain illnesses or procedures that irritate or dry out the nasal passages. Follow these instructions at home: When you have a nosebleed:   Sit down and tilt your head slightly forward.  Use a clean towel or tissue to pinch your nostrils under the bony part of your nose. After 10 minutes, let go of your nose and see if bleeding starts again. Do not release pressure before that time. If there is still bleeding, repeat the pinching and holding for 10 minutes until the bleeding stops.  Do not place tissues or gauze in the nose to stop bleeding.  Avoid lying down and avoid tilting your head backward. That may make blood collect in the throat and cause gagging or coughing.  Use a nasal spray decongestant to help with a nosebleed as told by your health care provider.  Do not use petroleum jelly or mineral oil in your nose. It can drip into your lungs. After a nosebleed:  Avoid blowing your nose or sniffing for a number of hours.  Avoid straining, lifting, or bending at the waist for several days. You may resume other normal activities as you are able.  Use saline spray or a humidifier as told by your health care provider.  Aspirinand blood thinners make bleeding more  likely. If you are prescribed these medicines and you suffer from nosebleeds: ? Ask your health care provider if you should stop taking the medicines or if you should adjust the dose. ? Do not stop taking medicines that your health care provider has recommended unless told by your health care provider.  If your nosebleed was caused by dry mucous membranes, use over-the-counter saline nasal spray or gel. This will keep the mucous membranes moist and allow them to heal. If you must use a lubricant: ? Choose one that is water-soluble. ? Use only as much as you need and use it only as often as needed. ? Do not lie down until several hours after you use it. Contact a health care provider if:  You have a fever.  You get nosebleeds often or more often than usual.  You bruise very easily.  You have a nosebleed from having something stuck in your nose.  You have bleeding in your mouth.  You vomit or cough up brown material.  You have a nosebleed after you start a new medicine. Get help right away if:  You have a nosebleed after a fall or a head injury.  Your nosebleed does not go away after 20 minutes.  You feel dizzy or weak.  You have unusual bleeding from other parts of your body.  You have unusual bruising on other parts of your body.  You become sweaty.  You   vomit blood. This information is not intended to replace advice given to you by your health care provider. Make sure you discuss any questions you have with your health care provider. Document Revised: 06/21/2017 Document Reviewed: 10/07/2015 Elsevier Patient Education  2020 [employer]  

## 2019-06-20 NOTE — Progress Notes (Signed)
This visit occurred during the SARS-CoV-2 public health emergency.  Safety protocols were in place, including screening questions prior to the visit, additional usage of staff PPE, and extensive cleaning of exam room while observing appropriate contact time as indicated for disinfecting solutions.  Subjective:     Patient ID: April Larson , female    DOB: 1951/03/04 , 69 y.o.   MRN: [id number]   Chief Complaint  Patient presents with  . Diabetes  . Epistaxis  . Hypertension    HPI  She is here today for DM/HTN check. She reports compliance with meds. She adds that she had nosebleed last night. Only from the right nostril. This was unprovoked. She noticed it upon awakening to use bathroom. It did stop after packing her nose. No h/o nosebleeds. No fever/chills. No trauma. She does take aspirin daily.   Diabetes She presents for her follow-up diabetic visit. She has type 2 diabetes mellitus. Pertinent negatives for hypoglycemia include no headaches. Pertinent negatives for diabetes include no blurred vision and no chest pain. Risk factors for coronary artery disease include diabetes mellitus, dyslipidemia, hypertension, post-menopausal and sedentary lifestyle. She participates in exercise intermittently. Her breakfast blood glucose is taken between 8-9 am. Her breakfast blood glucose range is generally 70-90 mg/dl. An ACE inhibitor/angiotensin II receptor blocker is being taken. Eye exam is current.  Epistaxis  The bleeding has been from the right nare. This is a new problem. The current episode started yesterday. The problem has been resolved. The bleeding is associated with aspirin.  Hypertension This is a chronic problem. The current episode started more than 1 year ago. Condition status: FAIR CONTROL. Pertinent negatives include no anxiety, blurred vision, chest pain or headaches. The current treatment provides moderate improvement.     Past Medical History:  Diagnosis  Date  . Diabetes mellitus without complication ([place])   . Hypertension   . Substance abuse ([place])      Family History  Problem Relation Age of Onset  . Cancer Mother   . Diabetes Father   . Cancer Brother   . Breast cancer Maternal Aunt      Current Outpatient Medications:  .  ACCU-CHEK FASTCLIX LANCETS MISC, USE TO CHECK BLOOD SUGARS TWICE DAILY, Disp: 100 each, Rfl: 11 .  ACCU-CHEK GUIDE test strip, USE TO CHECK BLOOD SUGARS TWICE DAILY AS DIRECTED, Disp: 100 each, Rfl: 11 .  acetaminophen (TYLENOL) 500 MG tablet, Take 500 mg by mouth every 6 (six) hours as needed., Disp: , Rfl:  .  APPLE CIDER VINEGAR PO, Take by mouth., Disp: , Rfl:  .  Ascorbic Acid (VITAMIN C PO), Take 1 tablet by mouth daily., Disp: , Rfl:  .  aspirin EC 81 MG tablet, Take 81 mg by mouth daily., Disp: , Rfl:  .  Biotin 10 MG CAPS, Take 1 tablet by mouth daily., Disp: , Rfl:  .  cetirizine (ZYRTEC) 10 MG tablet, Take 10 mg by mouth at bedtime., Disp: , Rfl:  .  cholecalciferol (VITAMIN D) 400 UNITS TABS, Take by mouth daily. 5,000 units, Disp: , Rfl:  .  Insulin Degludec-Liraglutide (XULTOPHY) 100-3.6 UNIT-MG/ML SOPN, Inject 25 Units into the skin daily., Disp: 9 pen, Rfl: 2 .  Insulin Pen Needle (B-D ULTRAFINE III SHORT PEN) 31G X 8 MM MISC, USE AS DIRECTED TWICE DAILY, Disp: 100 each, Rfl: 11 .  LIVALO 4 MG TABS, TAKE 1 TABLET BY MOUTH DAILY, Disp: 90 tablet, Rfl: 1 .  loratadine (CLARITIN) 10 MG tablet,  Take 10 mg by mouth daily., Disp: , Rfl:  .  Multiple Vitamins-Minerals (ONE-A-DAY WOMENS 50+ ADVANTAGE PO), Take 1 tablet by mouth daily., Disp: , Rfl:  .  NON FORMULARY, 2 each. Calm, Disp: , Rfl:  .  omeprazole (PRILOSEC) 40 MG capsule, TAKE 1 CAPSULE BY MOUTH EVERY DAY BEFORE A MEAL, Disp: 90 capsule, Rfl: 1 .  telmisartan-hydrochlorothiazide (MICARDIS HCT) 40-12.5 MG tablet, Take 1 tablet by mouth daily., Disp: 90 tablet, Rfl: 1 .  Turmeric 500 MG CAPS, Take 1 capsule by mouth., Disp: , Rfl:  .   fluticasone (FLONASE) 50 MCG/ACT nasal spray, SHAKE LIQUID AND USE 2 SPRAYS IN EACH NOSTRIL DAILY (Patient not taking: Reported on 06/20/2019), Disp: 16 g, Rfl: 2 .  metFORMIN (GLUCOPHAGE) 500 MG tablet, TAKE 1 TABLET BY MOUTH TWICE DAILY( EVERY TWELVE HOURS) (Patient taking differently: Take 500 mg by mouth daily with breakfast. ), Disp: 180 tablet, Rfl: 1   No Known Allergies   Review of Systems  Constitutional: Negative.   HENT: Positive for nosebleeds.   Eyes: Negative for blurred vision.  Respiratory: Negative.   Cardiovascular: Negative.  Negative for chest pain.  Gastrointestinal: Negative.   Neurological: Negative.  Negative for headaches.  Psychiatric/Behavioral: Negative.      Today's Vitals   06/20/19 1204  BP: 126/78  Pulse: 94  Temp: 97.9 F (36.6 C)  Weight: 178 lb 6.4 oz (80.9 kg)  Height: '5\' 4"'$  (1.626 m)  PainSc: 0-No pain   Body mass index is 30.62 kg/m.   Objective:  Physical Exam Vitals and nursing note reviewed.  Constitutional:      Appearance: Normal appearance.  HENT:     Head: Normocephalic and atraumatic.     Nose: Mucosal edema present.     Right Nostril: No epistaxis or occlusion.     Left Nostril: No epistaxis or occlusion.  Cardiovascular:     Rate and Rhythm: Normal rate and regular rhythm.     Heart sounds: Normal heart sounds.  Pulmonary:     Effort: Pulmonary effort is normal.     Breath sounds: Normal breath sounds.  Skin:    General: Skin is warm.  Neurological:     General: No focal deficit present.     Mental Status: She is alert.  Psychiatric:        Mood and Affect: Mood normal.        Behavior: Behavior normal.         Assessment And Plan:     1. Diabetes mellitus with stage 2 chronic kidney disease (HCC)  Chronic, this has been stable. I will check labs as listed below. She is encouraged to exercise 30 minutes five days per week - Hemoglobin A1c - BMP8+EGFR  2. Right-sided epistaxis  Her sx have resolved. She  is advised to line her nostrils with small amount of Vaseline tonight. She may benefit from humidifier. If recurrent, she agrees to ENT referral for further evaluation.   - CBC no Diff  3. Hypertensive nephropathy  Chronic, well controlled. She will continue with current meds. She is encouraged to avoid adding salt to her foods.   4. History of tobacco use  She has greater than 20 years h/o tobacco use. She agrees to low dose CT lung CA screen.   - CT CHEST LUNG CA SCREEN LOW DOSE W/O CM; Future        Maximino Greenland, MD    THE PATIENT IS ENCOURAGED TO PRACTICE SOCIAL DISTANCING DUE  TO THE COVID-19 PANDEMIC.

## 2019-06-21 LAB — BMP8+EGFR
BUN/Creatinine Ratio: 18 (ref 12–28)
BUN: 19 mg/dL (ref 8–27)
CO2: 23 mmol/L (ref 20–29)
Calcium: 10.3 mg/dL (ref 8.7–10.3)
Chloride: 101 mmol/L (ref 96–106)
Creatinine, Ser: 1.04 mg/dL — ABNORMAL HIGH (ref 0.57–1.00)
GFR calc Af Amer: 64 mL/min/{1.73_m2} (ref >59)
GFR calc non Af Amer: 56 mL/min/{1.73_m2} — ABNORMAL LOW (ref >59)
Glucose: 138 mg/dL — ABNORMAL HIGH (ref 65–99)
Potassium: 4.4 mmol/L (ref 3.5–5.2)
Sodium: 138 mmol/L (ref 134–144)

## 2019-06-21 LAB — CBC
Hematocrit: 40.3 % (ref 34.0–46.6)
Hemoglobin: 12.9 g/dL (ref 11.1–15.9)
MCH: 27.1 pg (ref 26.6–33.0)
MCHC: 32 g/dL (ref 31.5–35.7)
MCV: 85 fL (ref 79–97)
Platelets: 385 10*3/uL (ref 150–450)
RBC: 4.76 x10E6/uL (ref 3.77–5.28)
RDW: 13.1 % (ref 11.7–15.4)
WBC: 8.2 10*3/uL (ref 3.4–10.8)

## 2019-06-21 LAB — HEMOGLOBIN A1C
Est. average glucose Bld gHb Est-mCnc: 160 mg/dL
Hgb A1c MFr Bld: 7.2 % — ABNORMAL HIGH (ref 4.8–5.6)

## 2019-06-23 ENCOUNTER — Other Ambulatory Visit: Payer: Self-pay | Admitting: Internal Medicine

## 2019-06-25 ENCOUNTER — Encounter: Payer: Self-pay | Admitting: Adult Health

## 2019-06-27 ENCOUNTER — Other Ambulatory Visit: Payer: Self-pay

## 2019-06-27 ENCOUNTER — Telehealth: Payer: Self-pay

## 2019-06-27 ENCOUNTER — Ambulatory Visit: Payer: Medicare Other | Admitting: Adult Health

## 2019-06-27 ENCOUNTER — Encounter: Payer: Self-pay | Admitting: Adult Health

## 2019-06-27 VITALS — BP 154/74 | HR 90 | Temp 97.1°F | Ht 63.0 in | Wt 178.0 lb

## 2019-06-27 DIAGNOSIS — Z9989 Dependence on other enabling machines and devices: Secondary | ICD-10-CM

## 2019-06-27 DIAGNOSIS — G4733 Obstructive sleep apnea (adult) (pediatric): Secondary | ICD-10-CM | POA: Diagnosis not present

## 2019-06-27 NOTE — Patient Instructions (Signed)
Your Plan:  Continue using CPAP nightly  Order sent for mask refitting If you have any changes in your vision see PCP or go to ED If your symptoms worsen or you develop new symptoms please let us know.    Thank you for coming to see [place] at [facility]. I hope we have been able to provide you high quality care today.  You may receive a patient satisfaction survey over the next few weeks. We would appreciate your feedback and comments so that we may continue to improve ourselves and the health of our patients.

## 2019-06-27 NOTE — Progress Notes (Signed)
PATIENT: April Larson DOB: 1951/03/31  REASON FOR VISIT: follow up HISTORY FROM: patient  HISTORY OF PRESENT ILLNESS: Today 06/27/19:  Ms. Larson is a 69 year old female with a history of obstructive sleep apnea on CPAP.  She returns today for follow-up.  Her download indicates that she use her machine 10 on the last 30 days for compliance of 33%.  She used her machine greater than 4 hours on 9 days for compliance of 30%.  On average she uses her machine 5 hours and 38 minutes.  Her residual AHI is 3.7 on 9 cm of water with EPR 3.  Leak in the 95th percentile is 4.7 L/min.  Patient reports that there was approximately 2 weeks that she was unable to use the CPAP due to an ongoing nosebleed.  She also feels that her mask does not fit well.  HISTORY 12/28/18:  Ms. Larson is a 69 year old female with a history of obstructive sleep apnea on CPAP.  She returns today for follow-up.  Her download indicates that she used her machine 23 out of 30 days for compliance of 77%.  She used her machine greater than 4 hours 19 days for compliance of 63%.  On average she uses her machine 5 hours and 46 minutes.  Her residual AHI is 2 on 9 cm of water.  She does have a significant leak in the 95th percentile at 30.8 L/min.  She reports that she recently changed her mask to a different style.  She is hoping that that fixes her leak.  She states that there are some nights she takes the machine off if she cannot get comfortable.  She returns today for evaluation.  REVIEW OF SYSTEMS: Out of a complete 14 system review of symptoms, the patient complains only of the following symptoms, and all other reviewed systems are negative.  FSS 25 ESS 15  ALLERGIES: No Known Allergies  HOME MEDICATIONS: Outpatient Medications Prior to Visit  Medication Sig Dispense Refill  . ACCU-CHEK FASTCLIX LANCETS MISC USE TO CHECK BLOOD SUGARS TWICE DAILY 100 each 11  . ACCU-CHEK GUIDE test strip USE TO CHECK  BLOOD SUGARS TWICE DAILY AS DIRECTED 100 each 11  . acetaminophen (TYLENOL) 500 MG tablet Take 500 mg by mouth every 6 (six) hours as needed.    . APPLE CIDER VINEGAR PO Take by mouth.    . Ascorbic Acid (VITAMIN C PO) Take 1 tablet by mouth daily.    Marland Kitchen aspirin EC 81 MG tablet Take 81 mg by mouth daily.    . Biotin 10 MG CAPS Take 1 tablet by mouth daily.    . cetirizine (ZYRTEC) 10 MG tablet Take 10 mg by mouth at bedtime.    . cholecalciferol (VITAMIN D) 400 UNITS TABS Take by mouth daily. 5,000 units    . fluticasone (FLONASE) 50 MCG/ACT nasal spray SHAKE LIQUID AND USE 2 SPRAYS IN EACH NOSTRIL DAILY (Patient not taking: Reported on 06/20/2019) 16 g 2  . Insulin Degludec-Liraglutide (XULTOPHY) 100-3.6 UNIT-MG/ML SOPN Inject 25 Units into the skin daily. 9 pen 2  . Insulin Pen Needle (B-D ULTRAFINE III SHORT PEN) 31G X 8 MM MISC USE AS DIRECTED TWICE DAILY 100 each 11  . LIVALO 4 MG TABS TAKE 1 TABLET BY MOUTH DAILY 90 tablet 1  . loratadine (CLARITIN) 10 MG tablet Take 10 mg by mouth daily.    . metFORMIN (GLUCOPHAGE) 500 MG tablet TAKE 1 TABLET BY MOUTH TWICE DAILY( EVERY TWELVE HOURS) (Patient  taking differently: Take 500 mg by mouth daily with breakfast. ) 180 tablet 1  . Multiple Vitamins-Minerals (ONE-A-DAY WOMENS 50+ ADVANTAGE PO) Take 1 tablet by mouth daily.    . NON FORMULARY 2 each. Calm    . omeprazole (PRILOSEC) 40 MG capsule TAKE 1 CAPSULE BY MOUTH EVERY DAY BEFORE A MEAL 90 capsule 1  . telmisartan-hydrochlorothiazide (MICARDIS HCT) 40-12.5 MG tablet Take 1 tablet by mouth daily. 90 tablet 1  . Turmeric 500 MG CAPS Take 1 capsule by mouth.     No facility-administered medications prior to visit.    PAST MEDICAL HISTORY: Past Medical History:  Diagnosis Date  . Diabetes mellitus without complication ([place])   . Hypertension   . Substance abuse ([place])     PAST SURGICAL HISTORY: Past Surgical History:  Procedure Laterality Date  . EYE SURGERY      FAMILY  HISTORY: Family History  Problem Relation Age of Onset  . Cancer Mother   . Diabetes Father   . Cancer Brother   . Breast cancer Maternal Aunt     SOCIAL HISTORY: Social History   Socioeconomic History  . Marital status: Widowed    Spouse name: Not on file  . Number of children: Not on file  . Years of education: Not on file  . Highest education level: Not on file  Occupational History  . Occupation: retired  Tobacco Use  . Smoking status: Former Smoker    Packs/day: 1.00    Years: 30.00    Pack years: 30.00    Types: Cigarettes    Quit date: 2004    Years since quitting: 17.2  . Smokeless tobacco: Never Used  Substance and Sexual Activity  . Alcohol use: No    Alcohol/week: 0.0 standard drinks  . Drug use: No  . Sexual activity: Not Currently  Other Topics Concern  . Not on file  Social History Narrative  . Not on file   Social Determinants of Health   Financial Resource Strain: Low Risk   . Difficulty of Paying Living Expenses: Not hard at all  Food Insecurity: No Food Insecurity  . Worried About Charity fundraiser in the Last Year: Never true  . Ran Out of Food in the Last Year: Never true  Transportation Needs: No Transportation Needs  . Lack of Transportation (Medical): No  . Lack of Transportation (Non-Medical): No  Physical Activity: Insufficiently Active  . Days of Exercise per Week: 7 days  . Minutes of Exercise per Session: 20 min  Stress: No Stress Concern Present  . Feeling of Stress : Not at all  Social Connections:   . Frequency of Communication with Friends and Family:   . Frequency of Social Gatherings with Friends and Family:   . Attends Religious Services:   . Active Member of Clubs or Organizations:   . Attends Archivist Meetings:   Marland Kitchen Marital Status:   Intimate Partner Violence: Not At Risk  . Fear of Current or Ex-Partner: No  . Emotionally Abused: No  . Physically Abused: No  . Sexually Abused: No      PHYSICAL  EXAM  Vitals:   06/27/19 1052  BP: (!) 154/74  Pulse: 90  Temp: (!) 97.1 F (36.2 C)  Weight: 178 lb (80.7 kg)  Height: 5\' 3"  (1.6 m)   Body mass index is 31.53 kg/m.  Generalized: Well developed, in no acute distress  Chest: Lungs clear to auscultation bilaterally  Neurological examination  Mentation: Alert  oriented to time, place, history taking. Follows all commands speech and language fluent Cranial nerve II-XII: Extraocular movements were full, visual field were full on confrontational test Head turning and shoulder shrug  were normal and symmetric. Motor: The motor testing reveals 5 over 5 strength of all 4 extremities. Good symmetric motor tone is noted throughout.  Sensory: Sensory testing is intact to soft touch on all 4 extremities. No evidence of extinction is noted.  Gait and station: Gait is normal.    DIAGNOSTIC DATA (LABS, IMAGING, TESTING) - I reviewed patient records, labs, notes, testing and imaging myself where available.  Lab Results  Component Value Date   WBC 8.2 06/20/2019   HGB 12.9 06/20/2019   HCT 40.3 06/20/2019   MCV 85 06/20/2019   PLT 385 06/20/2019      Component Value Date/Time   NA 138 06/20/2019 1732   K 4.4 06/20/2019 1732   CL 101 06/20/2019 1732   CO2 23 06/20/2019 1732   GLUCOSE 138 (H) 06/20/2019 1732   GLUCOSE 81 08/20/2017 2105   BUN 19 06/20/2019 1732   CREATININE 1.04 (H) 06/20/2019 1732   CALCIUM 10.3 06/20/2019 1732   PROT 7.1 10/12/2018 1510   ALBUMIN 4.4 10/12/2018 1510   AST 17 10/12/2018 1510   ALT 16 10/12/2018 1510   ALKPHOS 94 10/12/2018 1510   BILITOT <0.2 10/12/2018 1510   GFRNONAA 56 (L) 06/20/2019 1732   GFRAA 64 06/20/2019 1732   Lab Results  Component Value Date   CHOL 164 10/12/2018   HDL 77 10/12/2018   LDLCALC 65 10/12/2018   TRIG 109 10/12/2018   CHOLHDL 2.1 10/12/2018   Lab Results  Component Value Date   HGBA1C 7.2 (H) 06/20/2019   Lab Results  Component Value Date   VITAMINB12 634  10/12/2018       ASSESSMENT AND PLAN 69 y.o. year old female  has a past medical history of Diabetes mellitus without complication ([place]), Hypertension, and Substance abuse ([place]). here with:  1. OSA on CPAP  - Noncompliance with CPAP however this was due to an ongoing nosebleed.  This has since resolved and she has restarted CPAP therapy -Mask refitting ordered - Encourage patient to use CPAP nightly and > 4 hours each night - F/U in 1 year or sooner if needed   I spent 20 minutes of face-to-face and non-face-to-face time with patient.  This included previsit chart review, lab review, study review, order entry, electronic health record documentation, patient education.  Ward Givens, MSN, NP-C 06/27/2019, 10:52 AM [facility] [address], [place], [place] [address] [phone]

## 2019-06-27 NOTE — Telephone Encounter (Signed)
Faxed CPAP orders to pts DME company per NP request.

## 2019-07-09 ENCOUNTER — Other Ambulatory Visit: Payer: Self-pay | Admitting: Internal Medicine

## 2019-07-11 ENCOUNTER — Other Ambulatory Visit: Payer: Self-pay | Admitting: Internal Medicine

## 2019-07-31 ENCOUNTER — Ambulatory Visit: Payer: Medicare Other | Admitting: Internal Medicine

## 2019-07-31 ENCOUNTER — Ambulatory Visit: Payer: Medicare Other

## 2019-08-10 ENCOUNTER — Telehealth: Payer: Self-pay | Admitting: Internal Medicine

## 2019-08-10 NOTE — Chronic Care Management (AMB) (Signed)
  Chronic Care Management   Note  08/10/2019 Name: April Larson MRN: [id number] DOB: June 01, 1950  April Larson is a 69 y.o. year old female who is a primary care patient of Glendale Chard, MD. I reached out to April Larson by phone today in response to a referral sent by Ms. [employer] health plan.     April Larson was given information about Chronic Care Management services today including:  1. CCM service includes personalized support from designated clinical staff supervised by her physician, including individualized plan of care and coordination with other care providers 2. 24/7 contact phone numbers for assistance for urgent and routine care needs. 3. Service will only be billed when office clinical staff spend 20 minutes or more in a month to coordinate care. 4. Only one practitioner may furnish and bill the service in a calendar month. 5. The patient may stop CCM services at any time (effective at the end of the month) by phone call to the office staff. 6. The patient will be responsible for cost sharing (co-pay) of up to 20% of the service fee (after annual deductible is met).  Patient agreed to services and verbal consent obtained.   Follow up plan: Telephone appointment with care management team member scheduled for: 09/05/2019.  [place], [place] [address] Direct Dial: [phone] [email or web address] Website: [email or web address]

## 2019-08-23 ENCOUNTER — Telehealth: Payer: Self-pay

## 2019-08-23 NOTE — Telephone Encounter (Signed)
.   Pt understands that although there may be some limitations with this type of visit, we will take all precautions to reduce any security or privacy concerns.  Pt understands that this will be treated like an in office visit and we will file with pt's insurance, and there may be a patient responsible charge related to this service.   Verbal consent given for virtual appt.

## 2019-08-27 ENCOUNTER — Encounter: Payer: Self-pay | Admitting: Nurse Practitioner

## 2019-08-27 ENCOUNTER — Other Ambulatory Visit: Payer: Self-pay

## 2019-08-27 ENCOUNTER — Telehealth (INDEPENDENT_AMBULATORY_CARE_PROVIDER_SITE_OTHER): Payer: Medicare Other | Admitting: Nurse Practitioner

## 2019-08-27 DIAGNOSIS — R490 Dysphonia: Secondary | ICD-10-CM | POA: Diagnosis not present

## 2019-08-27 MED ORDER — FLUTICASONE PROPIONATE 50 MCG/ACT NA SUSP: 2.0000 | Freq: Every day | NASAL | 2 refills | Status: DC

## 2019-08-27 NOTE — Progress Notes (Signed)
Virtual Visit via Video    This visit type was conducted due to national recommendations for restrictions regarding the COVID-19 Pandemic (e.g. social distancing) in an effort to limit this patient's exposure and mitigate transmission in our community.  Due to her co-morbid illnesses, this patient is at least at moderate risk for complications without adequate follow up.  This format is felt to be most appropriate for this patient at this time.  All issues noted in this document were discussed and addressed.  A limited physical exam was performed with this format.    This visit type was conducted due to national recommendations for restrictions regarding the COVID-19 Pandemic (e.g. social distancing) in an effort to limit this patient's exposure and mitigate transmission in our community.  Patients identity confirmed using two different identifiers.  This format is felt to be most appropriate for this patient at this time.  All issues noted in this document were discussed and addressed.  No physical exam was performed (except for noted visual exam findings with Video Visits).    Date:  08/27/2019   ID:  April Larson, DOB [birth year], MRN [id number]  Patient Location:  Home - spoke with Danie Binder  Provider location:   Office    Chief Complaint:  sorethroat after nose bleed  History of Present Illness:    April Larson is a 69 y.o. female who presents via video conferencing for a telehealth visit today.    The patient does not have symptoms concerning for COVID-19 infection (fever, chills, cough, or new shortness of breath).   On May 11th she had a nose bleed after applying vicks vapor rub to her nostrils, then on May 12th she has been hoarse. She is having post nasal drainage with coughing in the morning with a yellow color.  She is taking Zyrtec at night and claritin in the daytime.  She is not using a nasal spray.  Denies fever.      Past  Medical History:  Diagnosis Date  . Diabetes mellitus without complication ([place])   . Hypertension   . Substance abuse [facility])    Past Surgical History:  Procedure Laterality Date  . EYE SURGERY       Current Meds  Medication Sig  . ACCU-CHEK FASTCLIX LANCETS MISC USE TO CHECK BLOOD SUGARS TWICE DAILY  . ACCU-CHEK GUIDE test strip USE TO CHECK BLOOD SUGARS TWICE DAILY AS DIRECTED  . acetaminophen (TYLENOL) 500 MG tablet Take 500 mg by mouth every 6 (six) hours as needed.  . APPLE CIDER VINEGAR PO Take by mouth.  . Ascorbic Acid (VITAMIN C PO) Take 1 tablet by mouth daily.  Marland Kitchen aspirin EC 81 MG tablet Take 81 mg by mouth daily.  . Biotin 10 MG CAPS Take 1 tablet by mouth daily.  . cetirizine (ZYRTEC) 10 MG tablet Take 10 mg by mouth at bedtime.  . cholecalciferol (VITAMIN D) 400 UNITS TABS Take by mouth daily. 5,000 units  . fluticasone (FLONASE) 50 MCG/ACT nasal spray Place 2 sprays into both nostrils daily.  . Insulin Degludec-Liraglutide (XULTOPHY) 100-3.6 UNIT-MG/ML SOPN Inject 25 Units into the skin daily.  . Insulin Pen Needle (B-D ULTRAFINE III SHORT PEN) 31G X 8 MM MISC USE AS DIRECTED TWICE DAILY  . LIVALO 4 MG TABS TAKE 1 TABLET BY MOUTH DAILY  . loratadine (CLARITIN) 10 MG tablet Take 10 mg by mouth daily.  . metFORMIN (GLUCOPHAGE) 500 MG tablet TAKE 1 TABLET BY MOUTH TWICE DAILY(  EVERY TWELVE HOURS) (Patient taking differently: Take 500 mg by mouth daily with breakfast. )  . Multiple Vitamins-Minerals (ONE-A-DAY WOMENS 50+ ADVANTAGE PO) Take 1 tablet by mouth daily.  . NON FORMULARY 2 each. Calm  . omeprazole (PRILOSEC) 40 MG capsule TAKE 1 CAPSULE BY MOUTH EVERY DAY BEFORE A MEAL  . telmisartan-hydrochlorothiazide (MICARDIS HCT) 40-12.5 MG tablet TAKE 1 TABLET BY MOUTH DAILY  . Turmeric 500 MG CAPS Take 1 capsule by mouth.  . [DISCONTINUED] fluticasone (FLONASE) 50 MCG/ACT nasal spray SHAKE LIQUID AND USE 2 SPRAYS IN EACH NOSTRIL DAILY     Allergies:   Patient has no known  allergies.   Social History   Tobacco Use  . Smoking status: Former Smoker    Packs/day: 1.00    Years: 30.00    Pack years: 30.00    Types: Cigarettes    Quit date: 2004    Years since quitting: 17.4  . Smokeless tobacco: Never Used  Substance Use Topics  . Alcohol use: No    Alcohol/week: 0.0 standard drinks  . Drug use: No     Family Hx: The patient's family history includes Breast cancer in her maternal aunt; Cancer in her brother and mother; Diabetes in her father.  ROS:   Please see the history of present illness.    Review of Systems  Constitutional: Negative.   HENT: Negative for congestion and sore throat.        She is having hoarseness  Respiratory: Negative.  Negative for shortness of breath.   Cardiovascular: Negative.   Neurological: Negative.  Negative for dizziness and tingling.  Psychiatric/Behavioral: Negative.     All other systems reviewed and are negative.   Labs/Other Tests and Data Reviewed:    Recent Labs: 10/12/2018: ALT 16 06/20/2019: BUN 19; Creatinine, Ser 1.04; Hemoglobin 12.9; Platelets 385; Potassium 4.4; Sodium 138   Recent Lipid Panel Lab Results  Component Value Date/Time   CHOL 164 10/12/2018 03:10 PM   TRIG 109 10/12/2018 03:10 PM   HDL 77 10/12/2018 03:10 PM   CHOLHDL 2.1 10/12/2018 03:10 PM   CHOLHDL 2.2 01/17/2009 05:45 AM   LDLCALC 65 10/12/2018 03:10 PM    Wt Readings from Last 3 Encounters:  06/27/19 178 lb (80.7 kg)  06/20/19 178 lb (80.7 kg)  06/20/19 178 lb 6.4 oz (80.9 kg)     Exam:    Vital Signs:  There were no vitals taken for this visit.    Physical Exam  Constitutional: She is oriented to person, place, and time and well-developed, well-nourished, and in no distress. No distress.  Pulmonary/Chest: Effort normal. No respiratory distress.  Neurological: She is alert and oriented to person, place, and time.  Psychiatric: Mood, memory, affect and judgment normal.    ASSESSMENT & PLAN:    1.  Hoarse  She is to use flonase to her nares  She is to call the office on Thursday to provide an update  This is likely related to postnasal drainage but can consider GERD - denies belching or epigastric.  - fluticasone (FLONASE) 50 MCG/ACT nasal spray; Place 2 sprays into both nostrils daily.  Dispense: 16 g; Refill: 2   COVID-19 Education: The signs and symptoms of COVID-19 were discussed with the patient and how to seek care for testing (follow up with PCP or arrange E-visit).  The importance of social distancing was discussed today.  Patient Risk:   After full review of this patients clinical status, I feel that they are at  least moderate risk at this time.  Time:   Today, I have spent 10 minutes/ seconds with the patient with telehealth technology discussing above diagnoses.     Medication Adjustments/Labs and Tests Ordered: Current medicines are reviewed at length with the patient today.  Concerns regarding medicines are outlined above.   Tests Ordered: No orders of the defined types were placed in this encounter.   Medication Changes: Meds ordered this encounter  Medications  . fluticasone (FLONASE) 50 MCG/ACT nasal spray    Sig: Place 2 sprays into both nostrils daily.    Dispense:  16 g    Refill:  2    Disposition:  Follow up prn  Signed, Minette Brine, FNP

## 2019-09-04 NOTE — Chronic Care Management (AMB) (Deleted)
Chronic Care Management Pharmacy  Name: April Larson  MRN: [id number] DOB: Apr 24, 1950  Chief Complaint/ HPI  April Larson,  69 y.o. , female presents for their Initial CCM visit with the clinical pharmacist {CHL HP Upstream Pharm visit AYTK:[id number]}.  PCP : Glendale Chard, MD  Their chronic conditions include: Hypertension, Diabetes  Office Visits: 08/27/19 Televisit: Presented for hoarseness, post nasal drainage and coughing with yellow sputum following nose bleed. PT has been self treating with Zyrtec at night and Claritin in the morning. Instructed to use Flonase as hoarseness is mote likely from post nasal drainage. Hoarseness could also be from GERD but pt denies symptoms.   06/20/19 AWV and OV: Presented for HTN and DM check. Pt also reported unprovoked nosebleed from right nostril. Nosebleed resolved after packing her nose. Chronic conditions stable. Labs ordered (BMP8+EGFR, HgbA1c, CBC no diff). Instructed pt to line nostrils with vaseline and could benefit from humidifier use. Referred to ENT for further evaluation. Will have Chest CT performed in the future due to 20 year history of smoking.   Consult Visit: 06/27/19 Neurology OV w/ M. [place]: Presented for OSA on CPAP follow up. Noncompliance with CPAP reported due to nosebleed. This issue has resolved and she has restarted CPAP therapy. Encourage nightly CPAP use for > 4 hours each not. Follow up in 1 year.    CCM Encounters:  Medications: Outpatient Encounter Medications as of 09/05/2019  Medication Sig  . ACCU-CHEK FASTCLIX LANCETS MISC USE TO CHECK BLOOD SUGARS TWICE DAILY  . ACCU-CHEK GUIDE test strip USE TO CHECK BLOOD SUGARS TWICE DAILY AS DIRECTED  . acetaminophen (TYLENOL) 500 MG tablet Take 500 mg by mouth every 6 (six) hours as needed.  . APPLE CIDER VINEGAR PO Take by mouth.  . Ascorbic Acid (VITAMIN C PO) Take 1 tablet by mouth daily.  Marland Kitchen aspirin EC 81 MG tablet Take 81 mg by  mouth daily.  . Biotin 10 MG CAPS Take 1 tablet by mouth daily.  . cetirizine (ZYRTEC) 10 MG tablet Take 10 mg by mouth at bedtime.  . cholecalciferol (VITAMIN D) 400 UNITS TABS Take by mouth daily. 5,000 units  . fluticasone (FLONASE) 50 MCG/ACT nasal spray Place 2 sprays into both nostrils daily.  . Insulin Degludec-Liraglutide (XULTOPHY) 100-3.6 UNIT-MG/ML SOPN Inject 25 Units into the skin daily.  . Insulin Pen Needle (B-D ULTRAFINE III SHORT PEN) 31G X 8 MM MISC USE AS DIRECTED TWICE DAILY  . LIVALO 4 MG TABS TAKE 1 TABLET BY MOUTH DAILY  . loratadine (CLARITIN) 10 MG tablet Take 10 mg by mouth daily.  . metFORMIN (GLUCOPHAGE) 500 MG tablet TAKE 1 TABLET BY MOUTH TWICE DAILY( EVERY TWELVE HOURS) (Patient taking differently: Take 500 mg by mouth daily with breakfast. )  . Multiple Vitamins-Minerals (ONE-A-DAY WOMENS 50+ ADVANTAGE PO) Take 1 tablet by mouth daily.  . NON FORMULARY 2 each. Calm  . omeprazole (PRILOSEC) 40 MG capsule TAKE 1 CAPSULE BY MOUTH EVERY DAY BEFORE A MEAL  . telmisartan-hydrochlorothiazide (MICARDIS HCT) 40-12.5 MG tablet TAKE 1 TABLET BY MOUTH DAILY  . Turmeric 500 MG CAPS Take 1 capsule by mouth.   No facility-administered encounter medications on file as of 09/05/2019.    Current Diagnosis/Assessment:    Goals Addressed   None     Diabetes   Recent Relevant Labs: Lab Results  Component Value Date/Time   HGBA1C 7.2 (H) 06/20/2019 05:32 PM   HGBA1C 6.9 (H) 02/14/2019 03:09 PM   MICROALBUR 10  10/12/2018 04:36 PM    Checking BG: {CHL HP Blood Glucose Monitoring Frequency:[phone]}  Recent FBG Readings: Recent pre-meal BG readings: *** Recent 2hr PP BG readings:  *** Recent HS BG readings: *** Patient has failed these meds in past: Levemir, Victoza, Actos Patient is currently uncontrolled on the following medications:  -Metformin 539m twice daily ***Once daily -Xultophy 25 units daily  Last diabetic Foot exam: 10/12/18 Last diabetic Eye  exam: Lab Results  Component Value Date/Time   HMDIABEYEEXA No Retinopathy 10/11/2018 12:00 AM    We discussed: -Diet extensively -Exercise extensively   Plan -Continue {CHL HP Upstream Pharmacy Plans:[phone]}   Hypertension   BP today is:  {CHL HP UPSTREAM Pharmacist BP [phone]}  Office blood pressures are  BP Readings from Last 3 Encounters:  06/27/19 (!) 154/74  06/20/19 126/78  06/20/19 126/78    Patient has failed these meds in the past: Losartan/HCTZ, olmesartan/HCTZ Patient is currently {CHL Controlled/[phone]} on the following medications:  -Telmisartan/ HCTZ 40/12.563mdaily  Patient checks BP at home {CHL HP BP Monitoring Frequency:[phone]}  Patient home BP readings are ranging: ***  We discussed: -Diet and exercise extensively   Plan -Continue {CHL HP Upstream Pharmacy Plans:[phone]}    Hyperlipidemia   Lipid Panel     Component Value Date/Time   CHOL 164 10/12/2018 1510   TRIG 109 10/12/2018 1510   HDL 77 10/12/2018 1510   LDLCALC 65 10/12/2018 1510     The ASCVD Risk score (Goff DC Jr., et al., 2013) failed to calculate for the following reasons:   The systolic blood pressure is missing   Patient has failed these meds in past: N/A Patient is currently controlled on the following medications:  -Livalo 96m77maily  We discussed:   Plan -Continue current medications   Vaccines   Reviewed and discussed patient's vaccination history.    Immunization History  Administered Date(s) Administered  . Fluad Quad(high Dose 65+) 01/01/2019  . Influenza, High Dose Seasonal PF 01/12/2018, 01/01/2019  . PFIZER SARS-COV-2 Vaccination 04/26/2019, 05/18/2019  . Pneumococcal Conjugate-13 10/13/2018  . Pneumococcal Polysaccharide-23 03/02/2017  . Tdap 09/18/2015  . Zoster Recombinat (Shingrix) 11/22/2018   Did you have both Shingrix doses?  Plan  Recommended patient receive *** vaccine in *** office/pharmacy.    Medication Management  ***[facility] medications Uses pill box? {Yes or If no, why not?:20788} Pt endorses ***% compliance  We discussed: ***  Plan -{US Pharmacy PlaRXYV:85929} Follow up: *** month phone visit  CouJannette FogoharmD Clinical Pharmacist [facility] [phone]

## 2019-09-05 ENCOUNTER — Telehealth: Payer: Medicare Other

## 2019-09-05 ENCOUNTER — Telehealth: Payer: Self-pay | Admitting: Internal Medicine

## 2019-09-05 NOTE — Chronic Care Management (AMB) (Signed)
  Chronic Care Management   Note  09/05/2019 Name: April Larson MRN: [id number] DOB: 04-05-1951  April Larson is a 69 y.o. year old female who is a primary care patient of Glendale Chard, MD and is actively engaged with the care management team. I reached out to [name] by phone today to assist with re-scheduling an initial visit with the Pharmacist. Patient states she received a call but had forgotten about what the call was for.   Follow up plan: Telephone appointment with care management team member scheduled for:10/10/2019  [address], [place], [place] [address] Direct Dial: [email or web address] Website: [email or web address]

## 2019-09-07 ENCOUNTER — Other Ambulatory Visit: Payer: Self-pay | Admitting: Internal Medicine

## 2019-09-25 DIAGNOSIS — E119 Type 2 diabetes mellitus without complications: Secondary | ICD-10-CM | POA: Diagnosis not present

## 2019-09-25 DIAGNOSIS — H26492 Other secondary cataract, left eye: Secondary | ICD-10-CM | POA: Diagnosis not present

## 2019-09-25 DIAGNOSIS — Z961 Presence of intraocular lens: Secondary | ICD-10-CM | POA: Diagnosis not present

## 2019-09-25 LAB — HM DIABETES EYE EXAM

## 2019-09-27 ENCOUNTER — Encounter: Payer: Self-pay | Admitting: Internal Medicine

## 2019-10-02 ENCOUNTER — Ambulatory Visit: Payer: Self-pay

## 2019-10-02 ENCOUNTER — Telehealth: Payer: Self-pay

## 2019-10-02 DIAGNOSIS — E1122 Type 2 diabetes mellitus with diabetic chronic kidney disease: Secondary | ICD-10-CM

## 2019-10-02 DIAGNOSIS — N182 Chronic kidney disease, stage 2 (mild): Secondary | ICD-10-CM

## 2019-10-02 NOTE — Chronic Care Management (AMB) (Signed)
  Chronic Care Management   Outreach Note  10/02/2019 Name: April Larson MRN: [id number] DOB: [birth year]  Referred by: Glendale Chard, MD Reason for referral : Care Coordination   SW placed an unsuccessful outbound call to the patient to conduct an SDOH (social determinants of health) screen. SW left a HIPAA compliant voice message requesting a return call.  Follow Up Plan: SW will attempt a second outreach over the next two weeks.  Daneen Schick, BSW, CDP Social Worker, Certified Dementia Practitioner [place] [facility] [place] [facility] [phone]

## 2019-10-05 ENCOUNTER — Ambulatory Visit: Payer: Self-pay

## 2019-10-05 DIAGNOSIS — I129 Hypertensive chronic kidney disease with stage 1 through stage 4 chronic kidney disease, or unspecified chronic kidney disease: Secondary | ICD-10-CM

## 2019-10-05 DIAGNOSIS — E1122 Type 2 diabetes mellitus with diabetic chronic kidney disease: Secondary | ICD-10-CM

## 2019-10-05 NOTE — Chronic Care Management (AMB) (Deleted)
Chronic Care Management Pharmacy  Name: Edmonia Vilikia Watkins Larson  MRN: [id number] DOB: 11/23/1950  Chief Complaint/ HPI  April Larson,  69 y.o. , female presents for their Initial CCM visit with the clinical pharmacist {CHL HP Upstream Pharm visit [id number]:[id number]}.  PCP : Glendale Chard, MD  Their chronic conditions include: HTN with CKD II, Type 2 DM, Right Hip Pain, Estrogen deficiency, substance abuse, DM.  Office Visits:*** 08/27/2019 - video visit for hoarseness - use flonase. Update provider. If not resolved could consider GERD.  06/20/2019 - AWV.  06/20/2019 - A1c, BMP8+EGFR, CBC no Diff ordered. CT chest lung screening due to 20 year h/o tobacco use. Line nose with vaseline to prevent nose bleed. May consider humidifier.  Consult Visit:*** 06/27/2019 - Neuro - OSA on CPAP. Non compliance with CPAP due to nose bleed. No resolved and starting CPAP therapy again. Mask refitting ordered. Encouraged patient to use CPAP nightly and >4 hours each night.  CCM Visits:  10/05/2019 - Follow Up Plan: No SW follow up planned at this time. SW encouraged the patient to contact SW directly with future resource needs. Medications: Outpatient Encounter Medications as of 10/10/2019  Medication Sig  . ACCU-CHEK FASTCLIX LANCETS MISC USE TO CHECK BLOOD SUGARS TWICE DAILY  . ACCU-CHEK GUIDE test strip USE TO CHECK BLOOD SUGARS TWICE DAILY AS DIRECTED  . acetaminophen (TYLENOL) 500 MG tablet Take 500 mg by mouth every 6 (six) hours as needed.  . APPLE CIDER VINEGAR PO Take by mouth.  . Ascorbic Acid (VITAMIN C PO) Take 1 tablet by mouth daily.  Marland Kitchen aspirin EC 81 MG tablet Take 81 mg by mouth daily.  . Biotin 10 MG CAPS Take 1 tablet by mouth daily.  . cetirizine (ZYRTEC) 10 MG tablet Take 10 mg by mouth at bedtime.  . cholecalciferol (VITAMIN D) 400 UNITS TABS Take by mouth daily. 5,000 units  . fluticasone (FLONASE) 50 MCG/ACT nasal spray Place 2 sprays into both nostrils  daily.  . Insulin Degludec-Liraglutide (XULTOPHY) 100-3.6 UNIT-MG/ML SOPN Inject 25 Units into the skin daily.  . Insulin Pen Needle (B-D ULTRAFINE III SHORT PEN) 31G X 8 MM MISC USE AS DIRECTED TWICE DAILY  . LIVALO 4 MG TABS TAKE 1 TABLET BY MOUTH DAILY  . loratadine (CLARITIN) 10 MG tablet Take 10 mg by mouth daily.  . metFORMIN (GLUCOPHAGE) 500 MG tablet Take 1 tablet (500 mg total) by mouth daily with breakfast.  . Multiple Vitamins-Minerals (ONE-A-DAY WOMENS 50+ ADVANTAGE PO) Take 1 tablet by mouth daily.  . NON FORMULARY 2 each. Calm  . omeprazole (PRILOSEC) 40 MG capsule TAKE 1 CAPSULE BY MOUTH EVERY DAY BEFORE A MEAL  . telmisartan-hydrochlorothiazide (MICARDIS HCT) 40-12.5 MG tablet TAKE 1 TABLET BY MOUTH DAILY  . Turmeric 500 MG CAPS Take 1 capsule by mouth.   No facility-administered encounter medications on file as of 10/10/2019.     Current Diagnosis/Assessment:  Goals Addressed   None    Hyperlipidemia   LDL goal < ***  Lipid Panel     Component Value Date/Time   CHOL 164 10/12/2018 1510   TRIG 109 10/12/2018 1510   HDL 77 10/12/2018 1510   LDLCALC 65 10/12/2018 1510    Hepatic Function Latest Ref Rng & Units 10/12/2018 05/15/2018 01/12/2018  Total Protein 6.0 - 8.5 g/dL 7.1 7.5 7.2  Albumin 3.8 - 4.8 g/dL 4.4 4.4 4.2  AST 0 - 40 IU/L _0 ALT 0 - 32 IU/L 16 13  13  Alk Phosphatase 39 - 117 IU/L 94 106 110  Total Bilirubin 0.0 - 1.2 mg/dL <0.2 <0.2 <0.2     The ASCVD Risk score Mikey Bussing DC Jr., et al., 2013) failed to calculate for the following reasons:   The systolic blood pressure is missing   Patient has failed these meds in past: *** Patient is currently {CHL Controlled/Uncontrolled:[phone]} on the following medications:  . Aspirin ec 81 mg daily . Livalo 4 mg daily  We discussed:  {CHL HP Upstream Pharmacy discussion:[phone]}  Plan  Continue {CHL HP Upstream Pharmacy Plans:[phone]}   Diabetes   Recent Relevant Labs: Lab Results    Component Value Date/Time   HGBA1C 7.2 (H) 06/20/2019 05:32 PM   HGBA1C 6.9 (H) 02/14/2019 03:09 PM   MICROALBUR 10 10/12/2018 04:36 PM     Checking BG: {CHL HP Blood Glucose Monitoring Frequency:[phone]}  Recent FBG Readings: Recent pre-meal BG readings: *** Recent 2hr PP BG readings:  *** Recent HS BG readings: *** Patient has failed these meds in past: Levemir, Victoza, pioglitazone Patient is currently {CHL Controlled/Uncontrolled:[phone]} on the following medications: accu-chek lancets and guide test strips cbs bid, Xultophy 100-3.6 unit-mg/mL 25 units into skin daily, metformin 500 mg daily with breakfast, pen needles  Last diabetic Foot exam:  Lab Results  Component Value Date/Time   HMDIABEYEEXA No Retinopathy 09/25/2019 12:00 AM    Last diabetic Eye exam: No results found for: HMDIABFOOTEX   We discussed: {CHL HP Upstream Pharmacy discussion:[phone]}  Plan  Continue {CHL HP Upstream Pharmacy Plans:[phone]},  Hypertension   BP today is:  {CHL HP UPSTREAM Pharmacist BP ranges:[phone]}  Office blood pressures are  BP Readings from Last 3 Encounters:  06/27/19 (!) 154/74  06/20/19 126/78  06/20/19 126/78    Patient has failed these meds in the past: losartan-hctz, olmesartan-hctz  Patient is currently controlled/uncontrolled*** on the following medications: telmisartan-hctz 40-12.5 mg daily Patient checks BP at home {CHL HP BP Monitoring Frequency:[phone]}  Patient home BP readings are ranging: ***  We discussed {CHL HP Upstream Pharmacy discussion:[phone]}  Plan  Continue {CHL HP Upstream Pharmacy Plans:[phone]}     and  Other Diagnosis:Right Hip Pain    Patient has failed these meds in past: diclofenac, hydrocodone-apap, meloxicam, ibuprofen Patient is currently {CHL Controlled/Uncontrolled:[phone]} on the following medications: acetaminophen   We discussed:  {CHL HP Upstream Pharmacy  discussion:[phone]}  Plan  Continue {CHL HP Upstream Pharmacy Plans:[phone]}   Osteopenia / Osteoporosis   Last DEXA Scan: 01/2019  T-Score femoral neck: -1.3  T-Score lumbar spine: -0.9  10-year probability of major osteoporotic fracture: 3.8%  10-year probability of hip fracture: 0.4%  No results found for: VD25OH   Patient {is;is not an osteoporosis candidate:23886}  Patient has failed these meds in past: *** Patient is currently {CHL Controlled/Uncontrolled:[phone]} on the following medications:   Vitamin D 5000 units daily   We discussed:  {Osteoporosis Counseling:23892}  Plan  Continue {CHL HP Upstream Pharmacy Plans:[phone]}   Health Maintenance   Patient is currently {CHL Controlled/Uncontrolled:[phone]} on the following medications:  . Omeprazole 40 mg daily - *** GERD not on hx or snapshot . Apple Cider vinegar - *** . Turmeric 500 mg caps daily - *** . Vitamin C daily - *** . Biotin 10 mg daily - *** . Women's multivitamin daily - *** . Cetirizine 10 mg qhs - ***allergies not listed in chart*** . Fluticasone nasal spray - 2 sprays en daily - *** . Loratadine 10 mg daily - ***  We discussed:  ***  Plan  Continue {CHL HP Upstream Pharmacy VPLWU:[id number]}  Vaccines   Reviewed and discussed patient's vaccination history.    Immunization History  Administered Date(s) Administered  . Fluad Quad(high Dose 65+) 01/01/2019  . Influenza, High Dose Seasonal PF 01/12/2018, 01/01/2019  . PFIZER SARS-COV-2 Vaccination 04/26/2019, 05/18/2019  . Pneumococcal Conjugate-13 10/13/2018  . Pneumococcal Polysaccharide-23 03/02/2017  . Tdap 09/18/2015  . Zoster Recombinat (Shingrix) 11/22/2018    Plan  Recommended patient receive *** vaccine in *** office.   Medication Management   Pt uses [facility] pharmacy for all medications Uses pill box? {Yes or If no, why not?:20788} Pt endorses ***% compliance  We discussed: ***  Plan  {US  Pharmacy QIXM:58006}    Follow up: *** month phone visit  ***

## 2019-10-05 NOTE — Chronic Care Management (AMB) (Signed)
°  Chronic Care Management    Social Work General Note  10/05/2019 Name: April Larson MRN: [id number] DOB: 1950/05/03  April Larson is a 69 y.o. year old female who is a primary care patient of Glendale Chard, MD. The CCM was consulted to assist the patient with care coordination.   Review of patient status, including review of consultants reports, relevant laboratory and other test results, and collaboration with appropriate care team members and the patient's provider was performed as part of comprehensive patient evaluation and provision of chronic care management services.    SDOH (Social Determinants of Health) assessments and interventions performed:  Yes, no acute challenges. SDOH Interventions     Most Recent Value  SDOH Interventions  Food Insecurity Interventions Intervention Not Indicated  Housing Interventions Intervention Not Indicated  Transportation Interventions Intervention Not Indicated       Outpatient Encounter Medications as of 10/05/2019  Medication Sig   ACCU-CHEK FASTCLIX LANCETS MISC USE TO CHECK BLOOD SUGARS TWICE DAILY   ACCU-CHEK GUIDE test strip USE TO CHECK BLOOD SUGARS TWICE DAILY AS DIRECTED   acetaminophen (TYLENOL) 500 MG tablet Take 500 mg by mouth every 6 (six) hours as needed.   APPLE CIDER VINEGAR PO Take by mouth.   Ascorbic Acid (VITAMIN C PO) Take 1 tablet by mouth daily.   aspirin EC 81 MG tablet Take 81 mg by mouth daily.   Biotin 10 MG CAPS Take 1 tablet by mouth daily.   cetirizine (ZYRTEC) 10 MG tablet Take 10 mg by mouth at bedtime.   cholecalciferol (VITAMIN D) 400 UNITS TABS Take by mouth daily. 5,000 units   fluticasone (FLONASE) 50 MCG/ACT nasal spray Place 2 sprays into both nostrils daily.   Insulin Degludec-Liraglutide (XULTOPHY) 100-3.6 UNIT-MG/ML SOPN Inject 25 Units into the skin daily.   Insulin Pen Needle (B-D ULTRAFINE III SHORT PEN) 31G X 8 MM MISC USE AS DIRECTED TWICE DAILY    LIVALO 4 MG TABS TAKE 1 TABLET BY MOUTH DAILY   loratadine (CLARITIN) 10 MG tablet Take 10 mg by mouth daily.   metFORMIN (GLUCOPHAGE) 500 MG tablet Take 1 tablet (500 mg total) by mouth daily with breakfast.   Multiple Vitamins-Minerals (ONE-A-DAY WOMENS 50+ ADVANTAGE PO) Take 1 tablet by mouth daily.   NON FORMULARY 2 each. Calm   omeprazole (PRILOSEC) 40 MG capsule TAKE 1 CAPSULE BY MOUTH EVERY DAY BEFORE A MEAL   telmisartan-hydrochlorothiazide (MICARDIS HCT) 40-12.5 MG tablet TAKE 1 TABLET BY MOUTH DAILY   Turmeric 500 MG CAPS Take 1 capsule by mouth.   No facility-administered encounter medications on file as of 10/05/2019.    Follow Up Plan: No SW follow up planned at this time. SW encouraged the patient to contact SW directly with future resource needs.       Daneen Schick, BSW, CDP Social Worker, Certified Dementia Practitioner [facility] [phone]

## 2019-10-10 ENCOUNTER — Other Ambulatory Visit: Payer: Self-pay

## 2019-10-10 ENCOUNTER — Ambulatory Visit: Payer: Medicare Other

## 2019-10-10 DIAGNOSIS — I129 Hypertensive chronic kidney disease with stage 1 through stage 4 chronic kidney disease, or unspecified chronic kidney disease: Secondary | ICD-10-CM

## 2019-10-10 DIAGNOSIS — J301 Allergic rhinitis due to pollen: Secondary | ICD-10-CM

## 2019-10-10 DIAGNOSIS — N182 Chronic kidney disease, stage 2 (mild): Secondary | ICD-10-CM

## 2019-10-10 DIAGNOSIS — E1122 Type 2 diabetes mellitus with diabetic chronic kidney disease: Secondary | ICD-10-CM

## 2019-10-10 NOTE — Chronic Care Management (AMB) (Signed)
Chronic Care Management Pharmacy  Name: April Larson  MRN: [id number] DOB: Apr 08, 1950  Chief Complaint/ HPI  April Larson,  69 y.o. , female presents for their Initial CCM visit with the clinical pharmacist via telephone due to COVID-19 Pandemic.  PCP : Glendale Chard, MD  Their chronic conditions include: HTN with CKD II, Type 2 DM, Allergies, Right Hip Pain, Estrogen deficiency.   Office Visits: 08/27/2019 Video visit: Complaint of sore throat after nosebleed. Hoarseness began 1 day after nosebleed along with post nasal drainage. Pt taking Zyrtec at night and Claritin during the day. Use Flonase. Update provider. If not resolved could consider GERD.   06/20/2019 AWV and OV: Presented for HTN/DM check and complaint of nosebleed. Labs ordered (HgbA1c, BMP8+EGFR, CBC no Diff). CT chest lung screening due to 20 year h/o tobacco use. Line nose with Vaseline to prevent nose bleed. May consider humidifier.   Consult Visits:  06/27/2019 Neurology OV w/ M. [place]: OSA on CPAP. Non compliance with CPAP due to nose bleed. Now resolved and starting CPAP therapy again. Mask refitting ordered. Encouraged patient to use CPAP nightly and >4 hours each night.   CCM Visits:  10/05/2019 SW: No follow up planned at this time. SW encouraged the patient to contact SW directly with future resource needs.  Medications: Outpatient Encounter Medications as of 10/10/2019  Medication Sig  . ACCU-CHEK FASTCLIX LANCETS MISC USE TO CHECK BLOOD SUGARS TWICE DAILY  . ACCU-CHEK GUIDE test strip USE TO CHECK BLOOD SUGARS TWICE DAILY AS DIRECTED  . acetaminophen (TYLENOL) 500 MG tablet Take 1,000 mg by mouth every 6 (six) hours as needed.   . APPLE CIDER VINEGAR PO Take by mouth. 2 gummies at night  . Ascorbic Acid (VITAMIN C PO) Take 1 tablet by mouth daily.  Marland Kitchen aspirin EC 81 MG tablet Take 81 mg by mouth daily.  . Biotin 10 MG CAPS Take 1 tablet by mouth daily.  . calcium  carbonate (OSCAL) 1500 (600 Ca) MG TABS tablet Take 600 mg of elemental calcium by mouth daily with breakfast.  . cetirizine (ZYRTEC) 10 MG tablet Take 10 mg by mouth at bedtime.  . Cholecalciferol 125 MCG (5000 UT) TABS Take 5,000 Units by mouth daily.   . fluticasone (FLONASE) 50 MCG/ACT nasal spray Place 2 sprays into both nostrils daily.  . Insulin Degludec-Liraglutide (XULTOPHY) 100-3.6 UNIT-MG/ML SOPN Inject 25 Units into the skin daily.  . Insulin Pen Needle (B-D ULTRAFINE III SHORT PEN) 31G X 8 MM MISC USE AS DIRECTED TWICE DAILY  . LIVALO 4 MG TABS TAKE 1 TABLET BY MOUTH DAILY  . loratadine (CLARITIN) 10 MG tablet Take 10 mg by mouth daily. AM  . metFORMIN (GLUCOPHAGE) 500 MG tablet Take 1 tablet (500 mg total) by mouth daily with breakfast. (Patient taking differently: Take 500 mg by mouth daily with breakfast. Dinner)  . Multiple Vitamins-Minerals (ONE-A-DAY WOMENS 50+ ADVANTAGE PO) Take 1 tablet by mouth daily.  . NON FORMULARY 2 each. Calm  . omeprazole (PRILOSEC) 40 MG capsule TAKE 1 CAPSULE BY MOUTH EVERY DAY BEFORE A MEAL  . telmisartan-hydrochlorothiazide (MICARDIS HCT) 40-12.5 MG tablet TAKE 1 TABLET BY MOUTH DAILY  . Turmeric 500 MG CAPS Take 1 capsule by mouth daily.    No facility-administered encounter medications on file as of 10/10/2019.   Current Diagnosis/Assessment:  SDOH Interventions     Most Recent Value  SDOH Interventions  Financial Strain Interventions Intervention Not Indicated     Goals Addressed  This Visit's Progress   . Pharmacy Care Plan       CARE PLAN ENTRY (see longitudinal plan of care for additional care plan information)  Current Barriers:  . Chronic Disease Management support, education, and care coordination needs related to Hypertension, Hyperlipidemia, and Diabetes   Hypertension BP Readings from Last 3 Encounters:  06/27/19 (!) 154/74  06/20/19 126/78  06/20/19 126/78   . Pharmacist Clinical Goal(s): o Over the  next 90 days, patient will work with PharmD and providers to achieve BP goal <130/80 . Current regimen:  o Telmisartan-HCTZ 40-12.5 mg daily . Interventions: o Recommend patient check blood pressure periodically and if symptomatic o Provided dietary and exercise recommendations o Mailed patient information about PLATE method . Patient self care activities - Over the next 90 days, patient will: o Check BP periodically and if symptomatic, document, and provide at future appointments o Ensure daily salt intake < 2300 mg/day o Exercise at least 30 minutes daily, 5 times per week o Try PLATE method for meal planning  Hyperlipidemia Lab Results  Component Value Date/Time   LDLCALC 65 10/12/2018 03:10 PM   . Pharmacist Clinical Goal(s): o Over the next 180 days, patient will work with PharmD and providers to maintain LDL goal < 70 . Current regimen:  o Livalo 69m daily . Interventions: o Provided dietary and exercise recommendations . Patient self care activities - Over the next 180 days, patient will: o Take cholesterol medication daily o Exercise at least 30 minutes daily, 5 times per week  Diabetes Lab Results  Component Value Date/Time   HGBA1C 7.2 (H) 06/20/2019 05:32 PM   HGBA1C 6.9 (H) 02/14/2019 03:09 PM   . Pharmacist Clinical Goal(s): o Over the next 90 days, patient will work with PharmD and providers to achieve A1c goal <7% . Current regimen:   Xultophy 100-3.6 unit-mg/mL 25 units into skin daily  Metformin 500 mg daily with dinner . Interventions: o Provided dietary and exercise recommendations o Mailed information to patient . Patient self care activities - Over the next 90 days, patient will: o Check blood sugar at bedtime, document, and provide at future appointments o Contact provider with any episodes of hypoglycemia o Exercise at least 30 minutes daily, 5 times per week  Allergies . Pharmacist Clinical Goal(s): o Over the next 90 days, patient will work  with PharmD and providers to control allergy symptoms . Current regimen:  . Cetirizine 10 mg at bedtime . Fluticasone nasal spray 2 sprays in both nostrils daily . Loratadine 10 mg daily in the morning . Interventions: o Recommend patient stop cetirizine at bedtime for 2 weeks, continue loratadine in the morning - Patient mentioned occasional blurry vision and dizziness . Patient self care activities - Over the next 14 days, patient will: o Stop cetirizine, but continue loratadine daily o Monitor allergy symptoms and be aware of blurry vision and dizziness  Medication management . Pharmacist Clinical Goal(s): o Over the next 90 days, patient will work with PharmD and providers to maintain optimal medication adherence . Current pharmacy: [facility] . Interventions o Comprehensive medication review performed. o Continue current medication management strategy . Patient self care activities - Over the next 90 days, patient will: o Focus on medication adherence by continued use of pill box o Take medications as prescribed o Report any questions or concerns to PharmD and/or provider(s)  Initial goal documentation       Hyperlipidemia   LDL goal < 70  Lipid Panel  Component Value Date/Time   CHOL 164 10/12/2018 1510   TRIG 109 10/12/2018 1510   HDL 77 10/12/2018 1510   LDLCALC 65 10/12/2018 1510    Hepatic Function Latest Ref Rng & Units 10/12/2018 05/15/2018 01/12/2018  Total Protein 6.0 - 8.5 g/dL 7.1 7.5 7.2  Albumin 3.8 - 4.8 g/dL 4.4 4.4 4.2  AST 0 - 40 IU/L _0 ALT 0 - 32 IU/L _1 Alk Phosphatase 39 - 117 IU/L 94 106 110  Total Bilirubin 0.0 - 1.2 mg/dL <0.2 <0.2 <0.2    The ASCVD Risk score (Cameron., et al., 2013) failed to calculate for the following reasons:   The systolic blood pressure is missing   Patient has failed these meds in past: N/A Patient is currently controlled on the following medications: . Livalo 4 mg daily  Aspirin EC 81 mg  daily  We discussed:   Denies side effects with Livalo  Pays $8.95 for 90 day supply, very affordable Diet extensively  Stopped eating late at night  Apples, cheese, grapes, cauliflower, broccoli, corn  Recommend blueberries and blackberries as lower in sugar than apples   Chicken (baked, fried, etc), steak, and pepperoni  Recommend lean protein chicken and fish  Limit pepperoni  Spaghetti and tacos one a month  Recommend PLATE method  Still gaining weight Exercise extensively  Walks her dog 2-3 times daily for a total of 30 minutes daily  Recommend 30 minutes 5 times weekly  Add resistance/weight training a couple days a week  Plan Continue current medications  Diabetes   Recent Relevant Labs: Lab Results  Component Value Date/Time   HGBA1C 7.2 (H) 06/20/2019 05:32 PM   HGBA1C 6.9 (H) 02/14/2019 03:09 PM   MICROALBUR 10 10/12/2018 04:36 PM    Checking BG: 2x per Day  Recent FBG Readings: 142 this am, normally in the high 70s Recent pre-meal BG readings:  Recent 2hr PP BG readings:   Recent HS BG readings:  Patient has failed these meds in past: Levemir, Victoza, pioglitazone Patient is currently uncontrolled on the following medications:   Xultophy 100-3.6 unit-mg/mL 25 units into skin daily  Metformin 500 mg daily with dinner  Last diabetic Foot exam: 10/12/18 Last diabetic Eye exam: Lab Results  Component Value Date/Time   HMDIABEYEEXA No Retinopathy 09/25/2019 12:00 AM   We discussed:   Goal FBG <130, 2hr PP <180  Pt states BG higher after eating corn  Discussed that corn has a high carbohydrate content  Recommend less starchy vegetables (broccoli, cauliflower, etc)  Send diet recommendations  Metformin and Xultophy before bedtime  Recommend pt check BG before she takes diabetic medications before bedtime  Drinks water and Pepsi zero  4-16oz bottles of water daily  Pepsi Zero  Plan Continue current medications   Hypertension     Office blood pressures are  BP Readings from Last 3 Encounters:  06/27/19 (!) 154/74  06/20/19 126/78  06/20/19 126/78   Patient has failed these meds in the past: Losartan-HCTZ, olmesartan-HCTZ  Patient is currently uncontrolled on the following medications:   Telmisartan-HCTZ 40-12.5 mg daily  Patient checks BP at home Does not check  Patient home BP readings are ranging: None to provide  We discussed:  Hardly ever has headaches  She feels good, so she doesn't check BP  Recommend check periodically and if symptomatic Diet extensively  Pt does not add any salt to food  Plan Continue current medications   Pain  Patient has failed these meds in past: Diclofenac, hydrocodone-APAP, meloxicam, ibuprofen Patient is currently controlled on the following medications:   Acetaminophen 1071m every 6 hours a needed  We discussed:    Pt takes Tylenol about twice a week as needed for arthritis  Plan Continue current medications   Osteopenia / Osteoporosis   Last DEXA Scan: 01/2019  T-Score femoral neck: -1.3  T-Score lumbar spine: -0.9  10-year probability of major osteoporotic fracture: 3.8%  10-year probability of hip fracture: 0.4%  No results found for: VD25OH   Patient is not a candidate for pharmacologic treatment  Patient has failed these meds in past: N/A Patient is currently controlled on the following medications:   Vitamin D 5000 units daily   We discussed:  Recommend weight-bearing and muscle strengthening exercises for building and maintaining bone density.  Plan Continue current medications   Allergies   Patient has failed these meds in past: N/A Patient is currently controlled on the following medications:  . Cetirizine 10 mg at bedtime . Fluticasone nasal spray 2 sprays in both nostrils daily . Loratadine 10 mg daily in the morning  We discussed:  Began taking Zyrtec and Claritin daily about 10 years ago  Pt has some blurry vision  and dizziness, but not every day  Could decrease to 1 allergy tablet daily instead of both to see if that helps symptoms  Recommend pt will stop Zyrtec for 2 weeks to see if that helps blurry vision and dizziness  Recommend pt keep close eye on symptoms  Plan Continue current medications  Indigestion   Patient has failed these meds in past: N/A Patient is currently controlled on the following medications:   Omeprazole 40 mg daily   We discussed:  Pt was prescribed omeprazole after colonoscopy for indigestion  Pt was taking at night  Recommend pt take 30-60 minutes before breakfast  Plan Continue current medications  Over the counter medications   Patient is currently  on the following medications:  . Apple Cider vinegar 2 nightly . Turmeric 500 mg capsule daily  . Vitamin C daily  . Biotin 10 mg daily  . Women's multivitamin daily   We discussed:    Pt had been using Vicks Vapo Rub in her nostrils because that's what she thought PCP recommended to prevent nose bleed and irritation  Advised pt that she is supposed to be using Vaseline, not Vicks Vapo Rub, per PCP office visit note  Pt asked about memory supplement, "Mental Clarity"  Advised pt that I would check for drug interactions with this medication  Plan Continue current medications  Check for drug interactions between patient's current medications and OTC supplement, Mental Clarity  Vaccines   Reviewed and discussed patient's vaccination history.    Immunization History  Administered Date(s) Administered  . Fluad Quad(high Dose 65+) 01/01/2019  . Influenza, High Dose Seasonal PF 01/12/2018, 01/01/2019  . PFIZER SARS-COV-2 Vaccination 04/26/2019, 05/18/2019  . Pneumococcal Conjugate-13 10/13/2018  . Pneumococcal Polysaccharide-23 03/02/2017  . Tdap 09/18/2015  . Zoster Recombinat (Shingrix) 11/22/2018   Plan No recommendations at this time  Medication Management   [facility] medications Uses pill box? Yes one for day and one for night Pt endorses 100% compliance  We discussed:   Importance of medication adherence daily  Plan Continue current medication management strategy  Review pharmacy preferred status  Discuss medication synchronization and adherence packaging at follow up   Follow up: 2 week phone visit  Jannette Fogo, PharmD Clinical Pharmacist [facility] [phone]

## 2019-10-15 ENCOUNTER — Telehealth: Payer: Self-pay

## 2019-10-22 ENCOUNTER — Ambulatory Visit (INDEPENDENT_AMBULATORY_CARE_PROVIDER_SITE_OTHER): Payer: Medicare Other

## 2019-10-22 ENCOUNTER — Other Ambulatory Visit: Payer: Self-pay

## 2019-10-22 DIAGNOSIS — I129 Hypertensive chronic kidney disease with stage 1 through stage 4 chronic kidney disease, or unspecified chronic kidney disease: Secondary | ICD-10-CM

## 2019-10-22 DIAGNOSIS — J301 Allergic rhinitis due to pollen: Secondary | ICD-10-CM

## 2019-10-22 DIAGNOSIS — E1122 Type 2 diabetes mellitus with diabetic chronic kidney disease: Secondary | ICD-10-CM

## 2019-10-22 NOTE — Chronic Care Management (AMB) (Signed)
Chronic Care Management Pharmacy  Name: April Larson  MRN: [id number] DOB: 1951/01/21  Chief Complaint/ HPI  April Larson,  69 y.o. , female presents for their Initial CCM visit with the clinical pharmacist via telephone due to COVID-19 Pandemic.  PCP : Glendale Chard, MD  Their chronic conditions include: HTN with CKD II, Type 2 DM, Allergies, Right Hip Pain, Estrogen deficiency.   Office Visits: 08/27/2019 Video visit: Complaint of sore throat after nosebleed. Hoarseness began 1 day after nosebleed along with post nasal drainage. Pt taking Zyrtec at night and Claritin during the day. Use Flonase. Update provider. If not resolved could consider GERD.   06/20/2019 AWV and OV: Presented for HTN/DM check and complaint of nosebleed. Labs ordered (HgbA1c, BMP8+EGFR, CBC no Diff). CT chest lung screening due to 20 year h/o tobacco use. Line nose with Vaseline to prevent nose bleed. May consider humidifier.   Consult Visits:  06/27/2019 Neurology OV w/ M. [place]: OSA on CPAP. Non compliance with CPAP due to nose bleed. Now resolved and starting CPAP therapy again. Mask refitting ordered. Encouraged patient to use CPAP nightly and >4 hours each night.   CCM Visits:  10/05/2019 SW: No follow up planned at this time. SW encouraged the patient to contact SW directly with future resource needs.  Medications: Outpatient Encounter Medications as of 10/22/2019  Medication Sig  . ACCU-CHEK FASTCLIX LANCETS MISC USE TO CHECK BLOOD SUGARS TWICE DAILY  . ACCU-CHEK GUIDE test strip USE TO CHECK BLOOD SUGARS TWICE DAILY AS DIRECTED  . acetaminophen (TYLENOL) 500 MG tablet Take 1,000 mg by mouth every 6 (six) hours as needed.   . APPLE CIDER VINEGAR PO Take by mouth. 2 gummies at night  . Ascorbic Acid (VITAMIN C PO) Take 1 tablet by mouth daily.  Marland Kitchen aspirin EC 81 MG tablet Take 81 mg by mouth daily.  . Biotin 10 MG CAPS Take 1 tablet by mouth daily.  . calcium  carbonate (OSCAL) 1500 (600 Ca) MG TABS tablet Take 600 mg of elemental calcium by mouth daily with breakfast.  . Cholecalciferol 125 MCG (5000 UT) TABS Take 5,000 Units by mouth daily.   . fluticasone (FLONASE) 50 MCG/ACT nasal spray Place 2 sprays into both nostrils daily.  . Insulin Degludec-Liraglutide (XULTOPHY) 100-3.6 UNIT-MG/ML SOPN Inject 25 Units into the skin daily.  . Insulin Pen Needle (B-D ULTRAFINE III SHORT PEN) 31G X 8 MM MISC USE AS DIRECTED TWICE DAILY  . LIVALO 4 MG TABS TAKE 1 TABLET BY MOUTH DAILY  . loratadine (CLARITIN) 10 MG tablet Take 10 mg by mouth daily. AM  . metFORMIN (GLUCOPHAGE) 500 MG tablet Take 1 tablet (500 mg total) by mouth daily with breakfast. (Patient taking differently: Take 500 mg by mouth daily with breakfast. Dinner)  . Multiple Vitamins-Minerals (ONE-A-DAY WOMENS 50+ ADVANTAGE PO) Take 1 tablet by mouth daily.  . NON FORMULARY 2 each. Calm  . omeprazole (PRILOSEC) 40 MG capsule TAKE 1 CAPSULE BY MOUTH EVERY DAY BEFORE A MEAL  . telmisartan-hydrochlorothiazide (MICARDIS HCT) 40-12.5 MG tablet TAKE 1 TABLET BY MOUTH DAILY  . Turmeric 500 MG CAPS Take 1 capsule by mouth daily.   . cetirizine (ZYRTEC) 10 MG tablet Take 10 mg by mouth at bedtime. (Patient not taking: Reported on 10/23/2019)   No facility-administered encounter medications on file as of 10/22/2019.   Current Diagnosis/Assessment:   Goals Addressed            This Visit's Progress   .  Pharmacy Care Plan       CARE PLAN ENTRY (see longitudinal plan of care for additional care plan information)  Current Barriers:  . Chronic Disease Management support, education, and care coordination needs related to Hypertension, Hyperlipidemia, and Diabetes   Hypertension BP Readings from Last 3 Encounters:  06/27/19 (!) 154/74  06/20/19 126/78  06/20/19 126/78   . Pharmacist Clinical Goal(s): o Over the next 90 days, patient will work with PharmD and providers to achieve BP goal  <130/80 . Current regimen:  o Telmisartan-HCTZ 40-12.5 mg daily . Interventions: o Recommend patient check blood pressure periodically and if symptomatic o Provided dietary and exercise recommendations o Mailed patient information about PLATE method . Patient self care activities - Over the next 90 days, patient will: o Check BP periodically and if symptomatic, document, and provide at future appointments o Ensure daily salt intake < 2300 mg/day o Exercise at least 30 minutes daily, 5 times per week o Try PLATE method for meal planning  Hyperlipidemia Lab Results  Component Value Date/Time   LDLCALC 65 10/12/2018 03:10 PM   . Pharmacist Clinical Goal(s): o Over the next 180 days, patient will work with PharmD and providers to maintain LDL goal < 70 . Current regimen:  o Livalo '4mg'$  daily . Interventions: o Provided dietary and exercise recommendations . Patient self care activities - Over the next 180 days, patient will: o Take cholesterol medication daily o Exercise at least 30 minutes daily, 5 times per week  Diabetes Lab Results  Component Value Date/Time   HGBA1C 7.2 (H) 06/20/2019 05:32 PM   HGBA1C 6.9 (H) 02/14/2019 03:09 PM   . Pharmacist Clinical Goal(s): o Over the next 90 days, patient will work with PharmD and providers to achieve A1c goal <7% . Current regimen:   Xultophy 100-3.6 unit-mg/mL 25 units into skin daily  Metformin 500 mg daily with dinner . Interventions: o Provided dietary and exercise recommendations o Mailed information to patient . Patient self care activities - Over the next 90 days, patient will: o Check blood sugar at bedtime, document, and provide at future appointments o Contact provider with any episodes of hypoglycemia o Exercise at least 30 minutes daily, 5 times per week  Allergies . Pharmacist Clinical Goal(s): o Over the next 90 days, patient will work with PharmD and providers to control allergy symptoms . Current regimen:   . Fluticasone nasal spray 2 sprays in both nostrils daily . Loratadine 10 mg daily in the morning . Interventions: o Continue loratadine daily and discontinue cetirizine as allergy symptoms have not worsened and symptoms of blurry vision have possibly improved . Patient self care activities - Over the next 14 days, patient will: o Continue loratadine daily o Monitor allergy symptoms and be aware of blurry vision and dizziness  Medication management . Pharmacist Clinical Goal(s): o Over the next 90 days, patient will work with PharmD and providers to maintain optimal medication adherence . Current pharmacy: [facility] . Interventions o Comprehensive medication review performed. o Continue current medication management strategy o Reviewed drug interaction for memory supplement, Mental Clarity, and advised patient that there could be an increased risk of bleeding with that supplement and her aspirin therapy. Recommend Prevagen as a safer alternative as there are no know drug interactions. . Patient self care activities - Over the next 90 days, patient will: o Focus on medication adherence by continued use of pill box o Take medications as prescribed o Report any questions or concerns to PharmD  and/or provider(s)  Please see past updates related to this goal by clicking on the "Past Updates" button in the selected goal        Hyperlipidemia   LDL goal < 70  Lipid Panel     Component Value Date/Time   CHOL 164 10/12/2018 1510   TRIG 109 10/12/2018 1510   HDL 77 10/12/2018 1510   LDLCALC 65 10/12/2018 1510    Hepatic Function Latest Ref Rng & Units 10/12/2018 05/15/2018 01/12/2018  Total Protein 6.0 - 8.5 g/dL 7.1 7.5 7.2  Albumin 3.8 - 4.8 g/dL 4.4 4.4 4.2  AST 0 - 40 IU/L '17 12 12  '$ ALT 0 - 32 IU/L '16 13 13  '$ Alk Phosphatase 39 - 117 IU/L 94 106 110  Total Bilirubin 0.0 - 1.2 mg/dL <0.2 <0.2 <0.2    The ASCVD Risk score Mikey Bussing DC Jr., et al., 2013) failed to calculate for the  following reasons:   The systolic blood pressure is missing   Patient has failed these meds in past: N/A Patient is currently controlled on the following medications: . Livalo 4 mg daily  Aspirin EC 81 mg daily  We discussed:   Denies side effects with Livalo  Pays $8.95 for 90 day supply, very affordable Diet extensively  Stopped eating late at night  Apples, cheese, grapes, cauliflower, broccoli, corn  Recommend blueberries and blackberries as lower in sugar than apples   Chicken (baked, fried, etc), steak, and pepperoni  Recommend lean protein chicken and fish  Limit pepperoni  Spaghetti and tacos one a month  Recommend PLATE method  Still gaining weight Exercise extensively  Walks her dog 2-3 times daily for a total of 30 minutes daily  Recommend 30 minutes 5 times weekly  Add resistance/weight training a couple days a week  Plan Continue current medications  Diabetes   Recent Relevant Labs: Lab Results  Component Value Date/Time   HGBA1C 7.2 (H) 06/20/2019 05:32 PM   HGBA1C 6.9 (H) 02/14/2019 03:09 PM   MICROALBUR 10 10/12/2018 04:36 PM    Checking BG: 2x per Day  Recent FBG Readings: 142 this am, normally in the high 70s Recent pre-meal BG readings:  Recent 2hr PP BG readings:   Recent HS BG readings:  Patient has failed these meds in past: Levemir, Victoza, pioglitazone Patient is currently uncontrolled on the following medications:   Xultophy 100-3.6 unit-mg/mL 25 units into skin daily  Metformin 500 mg daily with dinner  Last diabetic Foot exam: 10/12/18 Last diabetic Eye exam: Lab Results  Component Value Date/Time   HMDIABEYEEXA No Retinopathy 09/25/2019 12:00 AM   We discussed:   Goal FBG <130, 2hr PP <180  Pt states BG higher after eating corn  Discussed that corn has a high carbohydrate content  Recommend less starchy vegetables (broccoli, cauliflower, etc)  Send diet recommendations  Metformin and Xultophy before  bedtime  Recommend pt check BG before she takes diabetic medications before bedtime  Drinks water and Pepsi zero  4-16oz bottles of water daily  Pepsi Zero  Plan Continue current medications   Hypertension   Office blood pressures are  BP Readings from Last 3 Encounters:  06/27/19 (!) 154/74  06/20/19 126/78  06/20/19 126/78   Patient has failed these meds in the past: Losartan-HCTZ, olmesartan-HCTZ  Patient is currently uncontrolled on the following medications:   Telmisartan-HCTZ 40-12.5 mg daily  Patient checks BP at home Does not check  Patient home BP readings are ranging: None to provide  We  discussed:  Hardly ever has headaches  She feels good, so she doesn't check BP  Recommend check periodically and if symptomatic Diet extensively  Pt does not add any salt to food  Plan Continue current medications   Pain   Patient has failed these meds in past: Diclofenac, hydrocodone-APAP, meloxicam, ibuprofen Patient is currently controlled on the following medications:   Acetaminophen '1000mg'$  every 6 hours a needed  We discussed:    Pt takes Tylenol about twice a week as needed for arthritis  Plan Continue current medications   Osteopenia / Osteoporosis   Last DEXA Scan: 01/2019  T-Score femoral neck: -1.3  T-Score lumbar spine: -0.9  10-year probability of major osteoporotic fracture: 3.8%  10-year probability of hip fracture: 0.4%  No results found for: VD25OH   Patient is not a candidate for pharmacologic treatment  Patient has failed these meds in past: N/A Patient is currently controlled on the following medications:   Vitamin D 5000 units daily   We discussed:  Recommend weight-bearing and muscle strengthening exercises for building and maintaining bone density.   Pt reports that she has started weight bearing exercises and she feels great  Plan Continue current medications  Recommend Vitamin D level at next office visit  Allergies    Patient has failed these meds in past: N/A Patient is currently controlled on the following medications:  . Fluticasone nasal spray 2 sprays in both nostrils daily . Loratadine 10 mg daily in the morning  We discussed:  Pt stopped Zyrtec at bedtime and has only been taking Claritin in the morning for the last 2 weeks  She states that she feels great and her allergies have been controlled  Pt states that blurry vision may be slightly improved  Plan Continue current medications  Indigestion   Patient has failed these meds in past: N/A Patient is currently controlled on the following medications:   Omeprazole 40 mg daily   We discussed:  Pt was prescribed omeprazole after colonoscopy for indigestion  Pt was taking at night  Recommend pt take 30-60 minutes before breakfast  Plan Continue current medications  Over the counter medications   Patient is currently  on the following medications:  . Apple Cider vinegar 2 nightly . Turmeric 500 mg capsule daily  . Vitamin C daily  . Biotin 10 mg daily  . Women's multivitamin daily   We discussed:    Discussed the memory supplement, "Mental Clarity"  Advised pt that Mental Clarity contains Gingko biloba, Ashwaghanda, and ginseng  There is a concern that Gingko biloba and ginseng could increase risk of bleeding and patient is already on aspirin therapy  Advised pt that Prevagen would be a safer option for a memory supplement as there are no known drug interaction  Plan Continue current medications  Start Prevagen for memory. Pt to discuss with PCP at next office visit.  Vaccines   Reviewed and discussed patient's vaccination history.    Immunization History  Administered Date(s) Administered  . Fluad Quad(high Dose 65+) 01/01/2019  . Influenza, High Dose Seasonal PF 01/12/2018, 01/01/2019  . PFIZER SARS-COV-2 Vaccination 04/26/2019, 05/18/2019  . Pneumococcal Conjugate-13 10/13/2018  . Pneumococcal  Polysaccharide-23 03/02/2017  . Tdap 09/18/2015  . Zoster Recombinat (Shingrix) 11/22/2018   Plan No recommendations at this time  Medication Management   Pt uses [facility] for all medications Uses pill box? Yes one for day and one for night Pt endorses 100% compliance  We discussed:   Importance of  medication adherence daily  Plan Continue current medication management strategy  Review pharmacy preferred status  Discuss medication synchronization and adherence packaging at follow up   Follow up: 8 week phone visit  Jannette Fogo, PharmD Clinical Pharmacist [facility] [phone]

## 2019-10-23 ENCOUNTER — Ambulatory Visit (INDEPENDENT_AMBULATORY_CARE_PROVIDER_SITE_OTHER): Payer: Medicare Other | Admitting: Internal Medicine

## 2019-10-23 ENCOUNTER — Encounter: Payer: Self-pay | Admitting: Internal Medicine

## 2019-10-23 VITALS — BP 128/72 | HR 95 | Temp 97.7°F | Ht 62.4 in | Wt 176.4 lb

## 2019-10-23 DIAGNOSIS — Z6831 Body mass index (BMI) 31.0-31.9, adult: Secondary | ICD-10-CM

## 2019-10-23 DIAGNOSIS — E559 Vitamin D deficiency, unspecified: Secondary | ICD-10-CM | POA: Insufficient documentation

## 2019-10-23 DIAGNOSIS — E1122 Type 2 diabetes mellitus with diabetic chronic kidney disease: Secondary | ICD-10-CM | POA: Diagnosis not present

## 2019-10-23 DIAGNOSIS — I129 Hypertensive chronic kidney disease with stage 1 through stage 4 chronic kidney disease, or unspecified chronic kidney disease: Secondary | ICD-10-CM

## 2019-10-23 DIAGNOSIS — N182 Chronic kidney disease, stage 2 (mild): Secondary | ICD-10-CM | POA: Diagnosis not present

## 2019-10-23 DIAGNOSIS — Z0000 Encounter for general adult medical examination without abnormal findings: Secondary | ICD-10-CM

## 2019-10-23 DIAGNOSIS — E6609 Other obesity due to excess calories: Secondary | ICD-10-CM

## 2019-10-23 LAB — POCT UA - MICROALBUMIN
Albumin/Creatinine Ratio, Urine, POC: <30
Creatinine, POC: 200 mg/dL
Microalbumin Ur, POC: 10 mg/L

## 2019-10-23 LAB — POCT URINALYSIS DIPSTICK
Bilirubin, UA: NEGATIVE
Blood, UA: NEGATIVE
Glucose, UA: NEGATIVE
Ketones, UA: NEGATIVE
Nitrite, UA: NEGATIVE
Protein, UA: NEGATIVE
Spec Grav, UA: 1.015 (ref 1.010–1.025)
Urobilinogen, UA: 0.2 E.U./dL
pH, UA: 5.5 (ref 5.0–8.0)

## 2019-10-23 NOTE — Progress Notes (Signed)
I,[name],acting as a Neurosurgeon for April Aliment, MD.,have documented all relevant documentation on the behalf of April Aliment, MD,as directed by  April Aliment, MD while in the presence of April Aliment, MD. This visit occurred during the SARS-CoV-2 public health emergency.  Safety protocols were in place, including screening questions prior to the visit, additional usage of staff PPE, and extensive cleaning of exam room while observing appropriate contact time as indicated for disinfecting solutions.  Subjective:     Patient ID: April Larson , female    DOB: 07-16-1950 , 69 y.o.   MRN: [id number]   Chief Complaint  Patient presents with  . Annual Exam  . Diabetes  . Hypertension    HPI  The patient is here today for a physical examination. She is followed by GYN in [place]. She can't recall her last visit.  Diabetes She presents for her follow-up diabetic visit. She has type 2 diabetes mellitus. There are no hypoglycemic associated symptoms. Pertinent negatives for hypoglycemia include no headaches. Pertinent negatives for diabetes include no blurred vision and no chest pain. There are no hypoglycemic complications. Risk factors for coronary artery disease include diabetes mellitus, dyslipidemia, hypertension, post-menopausal and sedentary lifestyle. Current diabetic treatment includes insulin injections. She is compliant with treatment most of the time. Her weight is fluctuating minimally. She is following a diabetic diet. She participates in exercise intermittently. Her home blood glucose trend is fluctuating minimally. Her breakfast blood glucose is taken between 8-9 am. Her breakfast blood glucose range is generally 70-90 mg/dl. An ACE inhibitor/angiotensin II receptor blocker is being taken.  Hypertension This is a chronic problem. The current episode started more than 1 year ago. The problem is controlled (FAIR CONTROL). Pertinent negatives include no  anxiety, blurred vision, chest pain or headaches. The current treatment provides moderate improvement. Compliance problems include exercise.  Hypertensive end-organ damage includes kidney disease.     Past Medical History:  Diagnosis Date  . Diabetes mellitus without complication (HCC)   . Hypertension   . Substance abuse (HCC)      Family History  Problem Relation Age of Onset  . Cancer Mother   . Diabetes Father   . Cancer Brother   . Breast cancer Maternal Aunt      Current Outpatient Medications:  .  ACCU-CHEK FASTCLIX LANCETS MISC, USE TO CHECK BLOOD SUGARS TWICE DAILY, Disp: 100 each, Rfl: 11 .  ACCU-CHEK GUIDE test strip, USE TO CHECK BLOOD SUGARS TWICE DAILY AS DIRECTED, Disp: 100 each, Rfl: 11 .  acetaminophen (TYLENOL) 500 MG tablet, Take 1,000 mg by mouth every 6 (six) hours as needed. , Disp: , Rfl:  .  APPLE CIDER VINEGAR PO, Take by mouth. 2 gummies at night, Disp: , Rfl:  .  Ascorbic Acid (VITAMIN C PO), Take 1 tablet by mouth daily., Disp: , Rfl:  .  aspirin EC 81 MG tablet, Take 81 mg by mouth daily., Disp: , Rfl:  .  Biotin 10 MG CAPS, Take 1 tablet by mouth daily., Disp: , Rfl:  .  calcium carbonate (OSCAL) 1500 (600 Ca) MG TABS tablet, Take 600 mg of elemental calcium by mouth daily with breakfast., Disp: , Rfl:  .  cetirizine (ZYRTEC) 10 MG tablet, Take 10 mg by mouth at bedtime. (Patient not taking: Reported on 10/23/2019), Disp: , Rfl:  .  Cholecalciferol 125 MCG (5000 UT) TABS, Take 5,000 Units by mouth daily. , Disp: , Rfl:  .  fluticasone (FLONASE)  50 MCG/ACT nasal spray, Place 2 sprays into both nostrils daily., Disp: 16 g, Rfl: 2 .  Insulin Degludec-Liraglutide (XULTOPHY) 100-3.6 UNIT-MG/ML SOPN, Inject 25 Units into the skin daily., Disp: 9 pen, Rfl: 2 .  Insulin Pen Needle (B-D ULTRAFINE III SHORT PEN) 31G X 8 MM MISC, USE AS DIRECTED TWICE DAILY, Disp: 100 each, Rfl: 11 .  LIVALO 4 MG TABS, TAKE 1 TABLET BY MOUTH DAILY, Disp: 90 tablet, Rfl: 1 .   loratadine (CLARITIN) 10 MG tablet, Take 10 mg by mouth daily. AM, Disp: , Rfl:  .  metFORMIN (GLUCOPHAGE) 500 MG tablet, Take 1 tablet (500 mg total) by mouth daily with breakfast. (Patient taking differently: Take 500 mg by mouth daily with breakfast. Dinner), Disp: 90 tablet, Rfl: 1 .  Multiple Vitamins-Minerals (ONE-A-DAY WOMENS 50+ ADVANTAGE PO), Take 1 tablet by mouth daily., Disp: , Rfl:  .  NON FORMULARY, 2 each. Calm, Disp: , Rfl:  .  NON FORMULARY, Focus factor, Disp: , Rfl:  .  omeprazole (PRILOSEC) 40 MG capsule, TAKE 1 CAPSULE BY MOUTH EVERY DAY BEFORE A MEAL, Disp: 90 capsule, Rfl: 1 .  telmisartan-hydrochlorothiazide (MICARDIS HCT) 40-12.5 MG tablet, TAKE 1 TABLET BY MOUTH DAILY, Disp: 90 tablet, Rfl: 1 .  Turmeric 500 MG CAPS, Take 1 capsule by mouth daily. , Disp: , Rfl:    No Known Allergies    The patient states she uses post menopausal status for birth control. Last LMP was No LMP recorded. Patient is postmenopausal.. Negative for Dysmenorrhea Negative for: breast discharge, breast lump(s), breast pain and breast self exam. Associated symptoms include abnormal vaginal bleeding. Pertinent negatives include abnormal bleeding (hematology), anxiety, decreased libido, depression, difficulty falling sleep, dyspareunia, history of infertility, nocturia, sexual dysfunction, sleep disturbances, urinary incontinence, urinary urgency, vaginal discharge and vaginal itching. Diet regular.The patient states her exercise level is  moderate.  . The patient's tobacco use is:  Social History   Tobacco Use  Smoking Status Former Smoker  . Packs/day: 1.00  . Years: 30.00  . Pack years: 30.00  . Types: Cigarettes  . Quit date: 2004  . Years since quitting: 17.5  Smokeless Tobacco Never Used  . She has been exposed to passive smoke. The patient's alcohol use is:  Social History   Substance and Sexual Activity  Alcohol Use No  . Alcohol/week: 0.0 standard drinks   .   Review of Systems   Constitutional: Negative.   HENT: Negative.   Eyes: Negative.  Negative for blurred vision.  Respiratory: Negative.   Cardiovascular: Negative.  Negative for chest pain.  Gastrointestinal: Negative.   Endocrine: Negative.   Genitourinary: Negative.   Musculoskeletal: Negative.   Skin: Negative.   Allergic/Immunologic: Negative.   Neurological: Negative.  Negative for headaches.  Hematological: Negative.   Psychiatric/Behavioral: Negative.      Today's Vitals   10/23/19 1437  BP: 128/72  Pulse: 95  Temp: 97.7 F (36.5 C)  TempSrc: Oral  Weight: 176 lb 6.4 oz (80 kg)  Height: 5' 2.4" (1.585 m)  PainSc: 0-No pain   Body mass index is 31.85 kg/m.  Wt Readings from Last 3 Encounters:  10/23/19 176 lb 6.4 oz (80 kg)  06/27/19 178 lb (80.7 kg)  06/20/19 178 lb (80.7 kg)   Objective:  Physical Exam Constitutional:      General: She is not in acute distress.    Appearance: Normal appearance. She is well-developed. She is obese.  HENT:     Head: Normocephalic and atraumatic.  Right Ear: Hearing, tympanic membrane, ear canal and external ear normal. There is no impacted cerumen.     Left Ear: Hearing, tympanic membrane, ear canal and external ear normal. There is no impacted cerumen.     Nose:     Comments: Deferred, masked    Mouth/Throat:     Comments: Deferred,masked Eyes:     General: Lids are normal.     Extraocular Movements: Extraocular movements intact.     Conjunctiva/sclera: Conjunctivae normal.     Pupils: Pupils are equal, round, and reactive to light.     Funduscopic exam:    Right eye: No papilledema.        Left eye: No papilledema.  Neck:     Thyroid: No thyroid mass.     Vascular: No carotid bruit.  Cardiovascular:     Rate and Rhythm: Normal rate and regular rhythm.     Pulses: Normal pulses.          Dorsalis pedis pulses are 2+ on the right side and 2+ on the left side.     Heart sounds: Normal heart sounds. No murmur heard.   Pulmonary:      Effort: Pulmonary effort is normal.     Breath sounds: Normal breath sounds.  Chest:     Breasts: Tanner Score is 5.        Right: Normal.        Left: Normal.  Abdominal:     General: Abdomen is flat. Bowel sounds are normal. There is no distension.     Palpations: Abdomen is soft.     Tenderness: There is no abdominal tenderness.  Genitourinary:    Rectum: Guaiac result negative.  Musculoskeletal:        General: No swelling. Normal range of motion.     Cervical back: Full passive range of motion without pain, normal range of motion and neck supple.     Right lower leg: No edema.     Left lower leg: No edema.  Feet:     Right foot:     Protective Sensation: 5 sites tested. 5 sites sensed.     Skin integrity: Callus and dry skin present.     Toenail Condition: Right toenails are normal.     Left foot:     Protective Sensation: 5 sites tested. 5 sites sensed.     Skin integrity: Callus and dry skin present.     Toenail Condition: Left toenails are normal.  Skin:    General: Skin is warm and dry.     Capillary Refill: Capillary refill takes less than 2 seconds.  Neurological:     General: No focal deficit present.     Mental Status: She is alert and oriented to person, place, and time.     Cranial Nerves: No cranial nerve deficit.     Sensory: No sensory deficit.  Psychiatric:        Mood and Affect: Mood normal.        Behavior: Behavior normal.        Thought Content: Thought content normal.        Judgment: Judgment normal.         Assessment And Plan:    1. Routine general medical examination at health care facility  A full exam was performed. Importance of monthly self breast exams was discussed with the patient. PATIENT IS ADVISED TO GET 30-45 MINUTES REGULAR EXERCISE NO LESS THAN FOUR TO FIVE DAYS PER WEEK - BOTH  WEIGHTBEARING EXERCISES AND AEROBIC ARE RECOMMENDED.  PATIENT IS ADVISED TO FOLLOW A HEALTHY DIET WITH AT LEAST SIX FRUITS/VEGGIES PER DAY, DECREASE  INTAKE OF RED MEAT, AND TO INCREASE FISH INTAKE TO TWO DAYS PER WEEK.  MEATS/FISH SHOULD NOT BE FRIED, BAKED OR BROILED IS PREFERABLE.  I SUGGEST WEARING SPF 50 SUNSCREEN ON EXPOSED PARTS AND ESPECIALLY WHEN IN THE DIRECT SUNLIGHT FOR AN EXTENDED PERIOD OF TIME.  PLEASE AVOID FAST FOOD RESTAURANTS AND INCREASE YOUR WATER INTAKE.   2. Diabetes mellitus with stage 2 chronic kidney disease ([place])  Diabetic foot exam was performed. I DISCUSSED WITH THE PATIENT AT LENGTH REGARDING THE GOALS OF GLYCEMIC CONTROL AND POSSIBLE LONG-TERM COMPLICATIONS.  I  ALSO STRESSED THE IMPORTANCE OF COMPLIANCE WITH HOME GLUCOSE MONITORING, DIETARY RESTRICTIONS INCLUDING AVOIDANCE OF SUGARY DRINKS/PROCESSED FOODS,  ALONG WITH REGULAR EXERCISE.  I  ALSO STRESSED THE IMPORTANCE OF ANNUAL EYE EXAMS, SELF FOOT CARE AND COMPLIANCE WITH OFFICE VISITS.  - CBC - Hemoglobin A1c - CMP14+EGFR - Lipid panel - POCT Urinalysis Dipstick (81002) - POCT UA - Microalbumin - EKG 12-Lead   3. Benign hypertension with chronic kidney disease, stage II  Chronic, well controlled. She will continue with current meds. EKG performed, NSR w/o acute changes. She will rto in six months for re-evaluation.   - CMP14+EGFR - Lipid panel - EKG 12-Lead  4. Vitamin D deficiency  I WILL CHECK A VIT D LEVEL AND SUPPLEMENT AS NEEDED.  ALSO ENCOURAGED TO SPEND 15 MINUTES IN THE SUN DAILY.  - VITAMIN D 25 Hydroxy (Vit-D Deficiency, Fractures)  5. Class 1 obesity due to excess calories with serious comorbidity and body mass index (BMI) of 31.0 to 31.9 in adult  She is encouraged to strive for BMI less than 30 to decrease cardiac risk. Advised to aim for at least 150 minutes of exercise per week.    Patient was given opportunity to ask questions. Patient verbalized understanding of the plan and was able to repeat key elements of the plan. All questions were answered to their satisfaction.   Maximino Greenland, MD   I, Maximino Greenland, MD, have  reviewed all documentation for this visit. The documentation on 10/23/19 for the exam, diagnosis, procedures, and orders are all accurate and complete.  THE PATIENT IS ENCOURAGED TO PRACTICE SOCIAL DISTANCING DUE TO THE COVID-19 PANDEMIC.

## 2019-10-23 NOTE — Patient Instructions (Addendum)
Visit Information  Goals Addressed            This Visit's Progress   . Pharmacy Care Plan       CARE PLAN ENTRY (see longitudinal plan of care for additional care plan information)  Current Barriers:  . Chronic Disease Management support, education, and care coordination needs related to Hypertension, Hyperlipidemia, and Diabetes   Hypertension BP Readings from Last 3 Encounters:  06/27/19 (!) 154/74  06/20/19 126/78  06/20/19 126/78   . Pharmacist Clinical Goal(s): o Over the next 90 days, patient will work with PharmD and providers to achieve BP goal <130/80 . Current regimen:  o Telmisartan-HCTZ 40-12.5 mg daily . Interventions: o Recommend patient check blood pressure periodically and if symptomatic o Provided dietary and exercise recommendations o Mailed patient information about PLATE method . Patient self care activities - Over the next 90 days, patient will: o Check BP periodically and if symptomatic, document, and provide at future appointments o Ensure daily salt intake < 2300 mg/day o Exercise at least 30 minutes daily, 5 times per week o Try PLATE method for meal planning  Hyperlipidemia Lab Results  Component Value Date/Time   LDLCALC 65 10/12/2018 03:10 PM   . Pharmacist Clinical Goal(s): o Over the next 180 days, patient will work with PharmD and providers to maintain LDL goal < 70 . Current regimen:  o Livalo 4mg  daily . Interventions: o Provided dietary and exercise recommendations . Patient self care activities - Over the next 180 days, patient will: o Take cholesterol medication daily o Exercise at least 30 minutes daily, 5 times per week  Diabetes Lab Results  Component Value Date/Time   HGBA1C 7.2 (H) 06/20/2019 05:32 PM   HGBA1C 6.9 (H) 02/14/2019 03:09 PM   . Pharmacist Clinical Goal(s): o Over the next 90 days, patient will work with PharmD and providers to achieve A1c goal <7% . Current regimen:   Xultophy 100-3.6 unit-mg/mL 25  units into skin daily  Metformin 500 mg daily with dinner . Interventions: o Provided dietary and exercise recommendations o Mailed information to patient . Patient self care activities - Over the next 90 days, patient will: o Check blood sugar at bedtime, document, and provide at future appointments o Contact provider with any episodes of hypoglycemia o Exercise at least 30 minutes daily, 5 times per week  Allergies . Pharmacist Clinical Goal(s): o Over the next 90 days, patient will work with PharmD and providers to control allergy symptoms . Current regimen:  . Fluticasone nasal spray 2 sprays in both nostrils daily . Loratadine 10 mg daily in the morning . Interventions: o Continue loratadine daily and discontinue cetirizine as allergy symptoms have not worsened and symptoms of blurry vision have possibly improved . Patient self care activities - Over the next 14 days, patient will: o Continue loratadine daily o Monitor allergy symptoms and be aware of blurry vision and dizziness  Medication management . Pharmacist Clinical Goal(s): o Over the next 90 days, patient will work with PharmD and providers to maintain optimal medication adherence . Current pharmacy: [facility] . Interventions o Comprehensive medication review performed. o Continue current medication management strategy o Reviewed drug interaction for memory supplement, Mental Clarity, and advised patient that there could be an increased risk of bleeding with that supplement and her aspirin therapy. Recommend Prevagen as a safer alternative as there are no know drug interactions. . Patient self care activities - Over the next 90 days, patient will: o Focus on  medication adherence by continued use of pill box o Take medications as prescribed o Report any questions or concerns to PharmD and/or provider(s)  Please see past updates related to this goal by clicking on the "Past Updates" button in the selected goal          The patient verbalized understanding of instructions provided today and agreed to receive a mailed copy of patient instruction and/or educational materials.  Telephone follow up appointment with pharmacy team member scheduled for: 12/27/19 @ 3:30 PM  Jannette Fogo, PharmD Clinical Pharmacist [facility] [phone]    Diabetes Mellitus and Nutrition, Adult When you have diabetes (diabetes mellitus), it is very important to have healthy eating habits because your blood sugar (glucose) levels are greatly affected by what you eat and drink. Eating healthy foods in the appropriate amounts, at about the same times every day, can help you:  Control your blood glucose.  Lower your risk of heart disease.  Improve your blood pressure.  Reach or maintain a healthy weight. Every person with diabetes is different, and each person has different needs for a meal plan. Your health care provider may recommend that you work with a diet and nutrition specialist (dietitian) to make a meal plan that is best for you. Your meal plan may vary depending on factors such as:  The calories you need.  The medicines you take.  Your weight.  Your blood glucose, blood pressure, and cholesterol levels.  Your activity level.  Other health conditions you have, such as heart or kidney disease. How do carbohydrates affect me? Carbohydrates, also called carbs, affect your blood glucose level more than any other type of food. Eating carbs naturally raises the amount of glucose in your blood. Carb counting is a method for keeping track of how many carbs you eat. Counting carbs is important to keep your blood glucose at a healthy level, especially if you use insulin or take certain oral diabetes medicines. It is important to know how many carbs you can safely have in each meal. This is different for every person. Your dietitian can help you calculate how many carbs you should have at each  meal and for each snack. Foods that contain carbs include:  Bread, cereal, rice, pasta, and crackers.  Potatoes and corn.  Peas, beans, and lentils.  Milk and yogurt.  Fruit and juice.  Desserts, such as cakes, cookies, ice cream, and candy. How does alcohol affect me? Alcohol can cause a sudden decrease in blood glucose (hypoglycemia), especially if you use insulin or take certain oral diabetes medicines. Hypoglycemia can be a life-threatening condition. Symptoms of hypoglycemia (sleepiness, dizziness, and confusion) are similar to symptoms of having too much alcohol. If your health care provider says that alcohol is safe for you, follow these guidelines:  Limit alcohol intake to no more than 1 drink per day for nonpregnant women and 2 drinks per day for men. One drink equals 12 oz of beer, 5 oz of wine, or 1 oz of hard liquor.  Do not drink on an empty stomach.  Keep yourself hydrated with water, diet soda, or unsweetened iced tea.  Keep in mind that regular soda, juice, and other mixers may contain a lot of sugar and must be counted as carbs. What are tips for following this plan?  Reading food labels  Start by checking the serving size on the "Nutrition Facts" label of packaged foods and drinks. The amount of calories, carbs, fats, and other nutrients  listed on the label is based on one serving of the item. Many items contain more than one serving per package.  Check the total grams (g) of carbs in one serving. You can calculate the number of servings of carbs in one serving by dividing the total carbs by 15. For example, if a food has 30 g of total carbs, it would be equal to 2 servings of carbs.  Check the number of grams (g) of saturated and trans fats in one serving. Choose foods that have low or no amount of these fats.  Check the number of milligrams (mg) of salt (sodium) in one serving. Most people should limit total sodium intake to less than 2,300 mg per  day.  Always check the nutrition information of foods labeled as "low-fat" or "nonfat". These foods may be higher in added sugar or refined carbs and should be avoided.  Talk to your dietitian to identify your daily goals for nutrients listed on the label. Shopping  Avoid buying canned, premade, or processed foods. These foods tend to be high in fat, sodium, and added sugar.  Shop around the outside edge of the grocery store. This includes fresh fruits and vegetables, bulk grains, fresh meats, and fresh dairy. Cooking  Use low-heat cooking methods, such as baking, instead of high-heat cooking methods like deep frying.  Cook using healthy oils, such as olive, canola, or sunflower oil.  Avoid cooking with butter, cream, or high-fat meats. Meal planning  Eat meals and snacks regularly, preferably at the same times every day. Avoid going long periods of time without eating.  Eat foods high in fiber, such as fresh fruits, vegetables, beans, and whole grains. Talk to your dietitian about how many servings of carbs you can eat at each meal.  Eat 4-6 ounces (oz) of lean protein each day, such as lean meat, chicken, fish, eggs, or tofu. One oz of lean protein is equal to: ? 1 oz of meat, chicken, or fish. ? 1 egg. ?  cup of tofu.  Eat some foods each day that contain healthy fats, such as avocado, nuts, seeds, and fish. Lifestyle  Check your blood glucose regularly.  Exercise regularly as told by your health care provider. This may include: ? 150 minutes of moderate-intensity or vigorous-intensity exercise each week. This could be brisk walking, biking, or water aerobics. ? Stretching and doing strength exercises, such as yoga or weightlifting, at least 2 times a week.  Take medicines as told by your health care provider.  Do not use any products that contain nicotine or tobacco, such as cigarettes and e-cigarettes. If you need help quitting, ask your health care provider.  Work with  a Social worker or diabetes educator to identify strategies to manage stress and any emotional and social challenges. Questions to ask a health care provider  Do I need to meet with a diabetes educator?  Do I need to meet with a dietitian?  What number can I call if I have questions?  When are the best times to check my blood glucose? Where to find more information:  American Diabetes Association: diabetes.org  Academy of Nutrition and Dietetics: www.eatright.[employer] of Diabetes and Digestive and Kidney Diseases (NIH): [employer] Summary  A healthy meal plan will help you control your blood glucose and maintain a healthy lifestyle.  Working with a diet and nutrition specialist (dietitian) can help you make a meal plan that is best for you.  Keep in mind that carbohydrates (carbs)  and alcohol have immediate effects on your blood glucose levels. It is important to count carbs and to use alcohol carefully. This information is not intended to replace advice given to you by your health care provider. Make sure you discuss any questions you have with your health care provider. Document Revised: 03/04/2017 Document Reviewed: 04/26/2016 Elsevier Patient Education  2020 [employer].

## 2019-10-23 NOTE — Patient Instructions (Signed)
Visit Information  Goals Addressed            This Visit's Progress   . Pharmacy Care Plan       CARE PLAN ENTRY (see longitudinal plan of care for additional care plan information)  Current Barriers:  . Chronic Disease Management support, education, and care coordination needs related to Hypertension, Hyperlipidemia, and Diabetes   Hypertension BP Readings from Last 3 Encounters:  06/27/19 (!) 154/74  06/20/19 126/78  06/20/19 126/78   . Pharmacist Clinical Goal(s): o Over the next 90 days, patient will work with PharmD and providers to achieve BP goal <130/80 . Current regimen:  o Telmisartan-HCTZ 40-12.5 mg daily . Interventions: o Recommend patient check blood pressure periodically and if symptomatic o Provided dietary and exercise recommendations o Mailed patient information about PLATE method . Patient self care activities - Over the next 90 days, patient will: o Check BP periodically and if symptomatic, document, and provide at future appointments o Ensure daily salt intake < 2300 mg/day o Exercise at least 30 minutes daily, 5 times per week o Try PLATE method for meal planning  Hyperlipidemia Lab Results  Component Value Date/Time   LDLCALC 65 10/12/2018 03:10 PM   . Pharmacist Clinical Goal(s): o Over the next 180 days, patient will work with PharmD and providers to maintain LDL goal < 70 . Current regimen:  o Livalo 4mg  daily . Interventions: o Provided dietary and exercise recommendations . Patient self care activities - Over the next 180 days, patient will: o Take cholesterol medication daily o Exercise at least 30 minutes daily, 5 times per week  Diabetes Lab Results  Component Value Date/Time   HGBA1C 7.2 (H) 06/20/2019 05:32 PM   HGBA1C 6.9 (H) 02/14/2019 03:09 PM   . Pharmacist Clinical Goal(s): o Over the next 90 days, patient will work with PharmD and providers to achieve A1c goal <7% . Current regimen:   Xultophy 100-3.6 unit-mg/mL 25  units into skin daily  Metformin 500 mg daily with dinner . Interventions: o Provided dietary and exercise recommendations o Mailed information to patient . Patient self care activities - Over the next 90 days, patient will: o Check blood sugar at bedtime, document, and provide at future appointments o Contact provider with any episodes of hypoglycemia o Exercise at least 30 minutes daily, 5 times per week  Allergies . Pharmacist Clinical Goal(s): o Over the next 90 days, patient will work with PharmD and providers to control allergy symptoms . Current regimen:  . Cetirizine 10 mg at bedtime . Fluticasone nasal spray 2 sprays in both nostrils daily . Loratadine 10 mg daily in the morning . Interventions: o Recommend patient stop cetirizine at bedtime for 2 weeks, continue loratadine in the morning - Patient mentioned occasional blurry vision and dizziness . Patient self care activities - Over the next 14 days, patient will: o Stop cetirizine, but continue loratadine daily o Monitor allergy symptoms and be aware of blurry vision and dizziness  Medication management . Pharmacist Clinical Goal(s): o Over the next 90 days, patient will work with PharmD and providers to maintain optimal medication adherence . Current pharmacy: [facility] . Interventions o Comprehensive medication review performed. o Continue current medication management strategy . Patient self care activities - Over the next 90 days, patient will: o Focus on medication adherence by continued use of pill box o Take medications as prescribed o Report any questions or concerns to PharmD and/or provider(s)  Initial goal documentation  Ms. Watkins [place] was given information about Chronic Care Management services today including:  1. CCM service includes personalized support from designated clinical staff supervised by her physician, including individualized plan of care and coordination with other care  providers 2. 24/7 contact phone numbers for assistance for urgent and routine care needs. 3. Standard insurance, coinsurance, copays and deductibles apply for chronic care management only during months in which we provide at least 20 minutes of these services. Most insurances cover these services at 100%, however patients may be responsible for any copay, coinsurance and/or deductible if applicable. This service may help you avoid the need for more expensive face-to-face services. 4. Only one practitioner may furnish and bill the service in a calendar month. 5. The patient may stop CCM services at any time (effective at the end of the month) by phone call to the office staff.  Patient agreed to services and verbal consent obtained.   Patient verbalizes understanding of instructions provided today.  Telephone follow up appointment with pharmacy team member scheduled for: 10/22/19 @ 3:30 PM  Jannette Fogo, PharmD Clinical Pharmacist [facility] [phone]

## 2019-10-24 ENCOUNTER — Other Ambulatory Visit: Payer: Self-pay

## 2019-10-24 ENCOUNTER — Telehealth: Payer: Medicare Other

## 2019-10-24 ENCOUNTER — Ambulatory Visit: Payer: Self-pay

## 2019-10-24 DIAGNOSIS — I129 Hypertensive chronic kidney disease with stage 1 through stage 4 chronic kidney disease, or unspecified chronic kidney disease: Secondary | ICD-10-CM | POA: Diagnosis not present

## 2019-10-24 DIAGNOSIS — N182 Chronic kidney disease, stage 2 (mild): Secondary | ICD-10-CM | POA: Diagnosis not present

## 2019-10-24 DIAGNOSIS — E1122 Type 2 diabetes mellitus with diabetic chronic kidney disease: Secondary | ICD-10-CM

## 2019-10-24 LAB — CMP14+EGFR
ALT: 12 IU/L (ref 0–32)
AST: 12 IU/L (ref 0–40)
Albumin/Globulin Ratio: 1.3 (ref 1.2–2.2)
Albumin: 4.5 g/dL (ref 3.8–4.8)
Alkaline Phosphatase: 133 IU/L — ABNORMAL HIGH (ref 48–121)
BUN/Creatinine Ratio: 18 (ref 12–28)
BUN: 17 mg/dL (ref 8–27)
Bilirubin Total: 0.3 mg/dL (ref 0.0–1.2)
CO2: 24 mmol/L (ref 20–29)
Calcium: 10.5 mg/dL — ABNORMAL HIGH (ref 8.7–10.3)
Chloride: 100 mmol/L (ref 96–106)
Creatinine, Ser: 0.93 mg/dL (ref 0.57–1.00)
GFR calc Af Amer: 74 mL/min/{1.73_m2} (ref >59)
GFR calc non Af Amer: 64 mL/min/{1.73_m2} (ref >59)
Globulin, Total: 3.4 g/dL (ref 1.5–4.5)
Glucose: 187 mg/dL — ABNORMAL HIGH (ref 65–99)
Potassium: 4 mmol/L (ref 3.5–5.2)
Sodium: 138 mmol/L (ref 134–144)
Total Protein: 7.9 g/dL (ref 6.0–8.5)

## 2019-10-24 LAB — HEMOGLOBIN A1C
Est. average glucose Bld gHb Est-mCnc: 166 mg/dL
Hgb A1c MFr Bld: 7.4 % — ABNORMAL HIGH (ref 4.8–5.6)

## 2019-10-24 LAB — LIPID PANEL
Chol/HDL Ratio: 3.3 ratio (ref 0.0–4.4)
Cholesterol, Total: 186 mg/dL (ref 100–199)
HDL: 56 mg/dL (ref >39)
LDL Chol Calc (NIH): 110 mg/dL — ABNORMAL HIGH (ref 0–99)
Triglycerides: 109 mg/dL (ref 0–149)
VLDL Cholesterol Cal: 20 mg/dL (ref 5–40)

## 2019-10-24 LAB — VITAMIN D 25 HYDROXY (VIT D DEFICIENCY, FRACTURES): Vit D, 25-Hydroxy: 56.1 ng/mL (ref 30.0–100.0)

## 2019-10-25 DIAGNOSIS — G4733 Obstructive sleep apnea (adult) (pediatric): Secondary | ICD-10-CM | POA: Diagnosis not present

## 2019-10-26 NOTE — Patient Instructions (Signed)
Visit Information  Goals Addressed      Patient Stated   .  "to keep my BP well controlled" (pt-stated)        CARE PLAN ENTRY (see longitudinal plan of care for additional care plan information)  Current Barriers:  Marland Kitchen Knowledge Deficits related to disease process and Self Health management of HTN . Chronic Disease Management support and education needs related to DM II with stage 2 CKD, Hypertensive Nephropathy  Nurse Case Manager Clinical Goal(s):  Marland Kitchen Over the next 90 days, patient will work with the CCM team and PCP to address needs related to disease education and support to improve Self Health management of HTN  Interventions:  . Inter-disciplinary care team collaboration (see longitudinal plan of care) . Evaluation of current treatment plan related to Hypertension and patient's adherence to plan as established by provider. . Provided education to patient re: target BP <130/80; Educated on dietary and exercise recommendations . Reviewed current medication regimen for HTN:  o Telmisartan-HCTZ 40-12.5 mg daily . Advised patient, providing education and rationale, to monitor blood pressure daily and record, calling the CCM team and or PCP for findings outside established parameters . Discussed plans with patient for ongoing care management follow up and provided patient with direct contact information for care management team . Provided patient with printed educational materials related to What is High Blood Pressure; Life's Simple 7; Why Should I Lower Sodium?  Patient Self Care Activities:  . Self administers medications as prescribed . Attends all scheduled provider appointments . Calls pharmacy for medication refills . Performs ADL's independently . Performs IADL's independently . Calls provider office for new concerns or questions  Initial goal documentation     .  "to keep my diabetes under good control" (pt-stated)        CARE PLAN ENTRY (see longitudinal plan of care  for additional care plan information)  Current Barriers:  Marland Kitchen Knowledge Deficits related to disease process and Self Health management of DM . Chronic Disease Management support and education needs related to DM II with stage 2 CKD, Hypertensive Nephropathy  Nurse Case Manager Clinical Goal(s):  Marland Kitchen Over the next 90 days, patient will work with the CCM team and PCP to address needs related to disease education and support to help improve Self Health management of DM  Interventions:  . Inter-disciplinary care team collaboration (see longitudinal plan of care) . Provided education to patient and/or caregiver about advanced directives . Provided education to patient re: target A1c, daily glycemic control; FBS 80-130, <180 after meals; educated on 15'15' rule; Educated on recommendations for daily exercise, 30 min/5x week . Reviewed medications with patient and discussed patient understands and adheres to her pharmacological DM recommendations  Xultophy 100-3.6 unit-mg/mL 25 units into skin daily  Metformin 500 mg daily with dinne . Provided patient with printed educational materials related to Diabetes Management using the Plate Method; Diabetes Care Schedule; Preventing Complications with Diabetes; Grocery Shopping with Diabetes  . Advised patient, providing education and rationale, to check cbg 1-2 times daily before meals and record, calling the CCM team and or PCP for findings outside established parameters . Discussed plans with patient for ongoing care management follow up and provided patient with direct contact information for care management team   Patient Self Care Activities:  . Self administers medications as prescribed . Attends all scheduled provider appointments . Calls pharmacy for medication refills . Calls provider office for new concerns or questions  Initial goal documentation  Patient verbalizes understanding of instructions provided today.   Telephone follow up  appointment with care management team member scheduled for: 01/24/20  Barb Merino, RN, BSN, CCM Care Management Coordinator [facility]  Direct Phone: [phone]

## 2019-10-26 NOTE — Chronic Care Management (AMB) (Signed)
Chronic Care Management   Initial Visit Note  10/26/2019 Name: April Larson MRN: [id number] DOB: Sep 08, 1950  Referred by: April Chard, MD Reason for referral : Chronic Care Management (Initial RN CM outreach; enrolled by CG)   April Larson is a 69 y.o. year old female who is a primary care patient of April Chard, MD. The CCM team was consulted for assistance with chronic disease management and care coordination needs related to DM II with stage 2 CKD, Hypertensive Nephropathy.   Review of patient status, including review of consultants reports, relevant laboratory and other test results, and collaboration with appropriate care team members and the patient's provider was performed as part of comprehensive patient evaluation and provision of chronic care management services.    SDOH (Social Determinants of Health) assessments performed: Yes - No Acute Challenges noted  See Care Plan activities for detailed interventions related to [place] initial outbound CCM RN CM call to patient to assess for CCM needs, a care plan was established.     Medications: Outpatient Encounter Medications as of 10/24/2019  Medication Sig   ACCU-CHEK FASTCLIX LANCETS MISC USE TO CHECK BLOOD SUGARS TWICE DAILY   ACCU-CHEK GUIDE test strip USE TO CHECK BLOOD SUGARS TWICE DAILY AS DIRECTED   acetaminophen (TYLENOL) 500 MG tablet Take 1,000 mg by mouth every 6 (six) hours as needed.    APPLE CIDER VINEGAR PO Take by mouth. 2 gummies at night   Ascorbic Acid (VITAMIN C PO) Take 1 tablet by mouth daily.   aspirin EC 81 MG tablet Take 81 mg by mouth daily.   Biotin 10 MG CAPS Take 1 tablet by mouth daily.   calcium carbonate (OSCAL) 1500 (600 Ca) MG TABS tablet Take 600 mg of elemental calcium by mouth daily with breakfast.   cetirizine (ZYRTEC) 10 MG tablet Take 10 mg by mouth at bedtime. (Patient not taking: Reported on 10/23/2019)   Cholecalciferol 125 MCG  (5000 UT) TABS Take 5,000 Units by mouth daily.    fluticasone (FLONASE) 50 MCG/ACT nasal spray Place 2 sprays into both nostrils daily.   Insulin Degludec-Liraglutide (XULTOPHY) 100-3.6 UNIT-MG/ML SOPN Inject 25 Units into the skin daily.   Insulin Pen Needle (B-D ULTRAFINE III SHORT PEN) 31G X 8 MM MISC USE AS DIRECTED TWICE DAILY   LIVALO 4 MG TABS TAKE 1 TABLET BY MOUTH DAILY   loratadine (CLARITIN) 10 MG tablet Take 10 mg by mouth daily. AM   metFORMIN (GLUCOPHAGE) 500 MG tablet Take 1 tablet (500 mg total) by mouth daily with breakfast. (Patient taking differently: Take 500 mg by mouth daily with breakfast. Dinner)   Multiple Vitamins-Minerals (ONE-A-DAY WOMENS 50+ ADVANTAGE PO) Take 1 tablet by mouth daily.   NON FORMULARY 2 each. Calm   NON FORMULARY Focus factor   omeprazole (PRILOSEC) 40 MG capsule TAKE 1 CAPSULE BY MOUTH EVERY DAY BEFORE A MEAL   telmisartan-hydrochlorothiazide (MICARDIS HCT) 40-12.5 MG tablet TAKE 1 TABLET BY MOUTH DAILY   Turmeric 500 MG CAPS Take 1 capsule by mouth daily.    No facility-administered encounter medications on file as of 10/24/2019.     Objective:   Lab Results  Component Value Date   HGBA1C 7.4 (H) 10/23/2019   HGBA1C 7.2 (H) 06/20/2019   HGBA1C 6.9 (H) 02/14/2019   Lab Results  Component Value Date   MICROALBUR 10 10/23/2019   LDLCALC 110 (H) 10/23/2019   CREATININE 0.93 10/23/2019   BP Readings from Last 3  Encounters:  10/23/19 128/72  06/27/19 (!) 154/74  06/20/19 126/78    Goals Addressed      Patient Stated     "to keep my BP well controlled" (pt-stated)        CARE PLAN ENTRY (see longitudinal plan of care for additional care plan information)  Current Barriers:   Knowledge Deficits related to disease process and Self Health management of HTN  Chronic Disease Management support and education needs related to DM II with stage 2 CKD, Hypertensive Nephropathy  Nurse Case Manager Clinical Goal(s):   Over  the next 90 days, patient will work with the CCM team and PCP to address needs related to disease education and support to improve Self Health management of HTN  Interventions:   Inter-disciplinary care team collaboration (see longitudinal plan of care)  Evaluation of current treatment plan related to Hypertension and patient's adherence to plan as established by provider.  Provided education to patient re: target BP <130/80; Educated on dietary and exercise recommendations  Reviewed current medication regimen for HTN:  o Telmisartan-HCTZ 40-12.5 mg daily  Advised patient, providing education and rationale, to monitor blood pressure daily and record, calling the CCM team and or PCP for findings outside established parameters  Discussed plans with patient for ongoing care management follow up and provided patient with direct contact information for care management team  Provided patient with printed educational materials related to What is High Blood Pressure; Life's Simple 7; Why Should I Lower Sodium?  Patient Self Care Activities:   Self administers medications as prescribed  Attends all scheduled provider appointments  Calls pharmacy for medication refills  Performs ADL's independently  Performs IADL's independently  Calls provider office for new concerns or questions  Initial goal documentation       "to keep my diabetes under good control" (pt-stated)        April Larson (see longitudinal plan of care for additional care plan information)  Current Barriers:   Knowledge Deficits related to disease process and Self Health management of DM  Chronic Disease Management support and education needs related to DM II with stage 2 CKD, Hypertensive Nephropathy  Nurse Case Manager Clinical Goal(s):   Over the next 90 days, patient will work with the CCM team and PCP to address needs related to disease education and support to help improve Self Health management of  DM  Interventions:   Inter-disciplinary care team collaboration (see longitudinal plan of care)  Provided education to patient and/or caregiver about advanced directives  Provided education to patient re: target A1c, daily glycemic control; FBS 80-130, <180 after meals; educated on 15'15' rule; Educated on recommendations for daily exercise, 30 min/5x week  Reviewed medications with patient and discussed patient understands and adheres to her pharmacological DM recommendations  Xultophy 100-3.6 unit-mg/mL 25 units into skin daily  Metformin 500 mg daily with dinne  Provided patient with printed educational materials related to Diabetes Management using the Plate Method; Diabetes Care Schedule; Preventing Complications with Diabetes; Grocery Shopping with Diabetes   Advised patient, providing education and rationale, to check cbg 1-2 times daily before meals and record, calling the CCM team and or PCP for findings outside established parameters  Discussed plans with patient for ongoing care management follow up and provided patient with direct contact information for care management team   Patient Self Care Activities:   Self administers medications as prescribed  Attends all scheduled provider appointments  Calls pharmacy for medication refills  Calls  provider office for new concerns or questions  Initial goal documentation       Plan:   Telephone follow up appointment with care management team member scheduled for: 01/24/20  Barb Merino, RN, BSN, CCM Care Management Coordinator [place] [facility]  Direct Phone: [phone]

## 2019-11-23 ENCOUNTER — Other Ambulatory Visit: Payer: Self-pay | Admitting: Nurse Practitioner

## 2019-11-23 DIAGNOSIS — R490 Dysphonia: Secondary | ICD-10-CM

## 2019-12-04 DIAGNOSIS — Z03818 Encounter for observation for suspected exposure to other biological agents ruled out: Secondary | ICD-10-CM | POA: Diagnosis not present

## 2019-12-04 DIAGNOSIS — Z20822 Contact with and (suspected) exposure to covid-19: Secondary | ICD-10-CM | POA: Diagnosis not present

## 2019-12-27 ENCOUNTER — Other Ambulatory Visit: Payer: Self-pay

## 2019-12-27 ENCOUNTER — Ambulatory Visit: Payer: Self-pay

## 2019-12-27 DIAGNOSIS — I129 Hypertensive chronic kidney disease with stage 1 through stage 4 chronic kidney disease, or unspecified chronic kidney disease: Secondary | ICD-10-CM

## 2019-12-27 DIAGNOSIS — N182 Chronic kidney disease, stage 2 (mild): Secondary | ICD-10-CM

## 2019-12-27 DIAGNOSIS — E1122 Type 2 diabetes mellitus with diabetic chronic kidney disease: Secondary | ICD-10-CM

## 2019-12-27 DIAGNOSIS — J301 Allergic rhinitis due to pollen: Secondary | ICD-10-CM

## 2019-12-27 NOTE — Chronic Care Management (AMB) (Signed)
Chronic Care Management Pharmacy  Name: April Larson  MRN: [id number] DOB: 04-14-1950  Chief Complaint/ HPI  April Larson,  69 y.o. , female presents for their Follow-Up CCM visit with the clinical pharmacist via telephone due to COVID-19 Pandemic. PCP : Glendale Chard, MD  Their chronic conditions include: HTN with CKD II, Type 2 DM, Allergies, Right Hip Pain, Estrogen deficiency.   Office Visits: 10/23/19 OV: Annual physical exam. Diabetic foot exam performed. EKG performed, NSR without acute changes. HgbA1c up to 7.4%. Exercise 30 minutes 5 days per week. Liver and kidney function stable. Calcium slightly elevated, be sure to stay well hydrated. Vitamin D level is good, continue current supplementation. LDL 110.   08/27/2019 Video visit: Complaint of sore throat after nosebleed. Hoarseness began 1 day after nosebleed along with post nasal drainage. Pt taking Zyrtec at night and Claritin during the day. Use Flonase. Update provider. If not resolved could consider GERD.   06/20/2019 AWV and OV: Presented for HTN/DM check and complaint of nosebleed. Labs ordered (HgbA1c, BMP8+EGFR, CBC no Diff). CT chest lung screening due to 20 year h/o tobacco use. Line nose with Vaseline to prevent nose bleed. May consider humidifier.   Consult Visits:  06/27/2019 Neurology OV w/ M. [place]: OSA on CPAP. Non compliance with CPAP due to nose bleed. Now resolved and starting CPAP therapy again. Mask refitting ordered. Encouraged patient to use CPAP nightly and >4 hours each night.   CCM Visits:  10/05/2019 SW: No follow up planned at this time. SW encouraged the patient to contact SW directly with future resource needs.  Medications: Outpatient Encounter Medications as of 12/27/2019  Medication Sig   ACCU-CHEK FASTCLIX LANCETS MISC USE TO CHECK BLOOD SUGARS TWICE DAILY   ACCU-CHEK GUIDE test strip USE TO CHECK BLOOD SUGARS TWICE DAILY AS DIRECTED   acetaminophen  (TYLENOL) 500 MG tablet Take 1,000 mg by mouth every 6 (six) hours as needed.    APPLE CIDER VINEGAR PO Take by mouth. 2 gummies at night   Ascorbic Acid (VITAMIN C PO) Take 1 tablet by mouth daily.   aspirin EC 81 MG tablet Take 81 mg by mouth daily.   Biotin 10 MG CAPS Take 1 tablet by mouth daily.   calcium carbonate (OSCAL) 1500 (600 Ca) MG TABS tablet Take 600 mg of elemental calcium by mouth daily with breakfast.   Cholecalciferol 125 MCG (5000 UT) TABS Take 5,000 Units by mouth daily.    fluticasone (FLONASE) 50 MCG/ACT nasal spray SHAKE LIQUID AND USE 2 SPRAYS IN EACH NOSTRIL DAILY   Insulin Degludec-Liraglutide (XULTOPHY) 100-3.6 UNIT-MG/ML SOPN Inject 25 Units into the skin daily.   Insulin Pen Needle (B-D ULTRAFINE III SHORT PEN) 31G X 8 MM MISC USE AS DIRECTED TWICE DAILY   LIVALO 4 MG TABS TAKE 1 TABLET BY MOUTH DAILY   loratadine (CLARITIN) 10 MG tablet Take 10 mg by mouth daily. AM   metFORMIN (GLUCOPHAGE) 500 MG tablet Take 1 tablet (500 mg total) by mouth daily with breakfast. (Patient taking differently: Take 500 mg by mouth daily with breakfast. Dinner)   Multiple Vitamins-Minerals (ONE-A-DAY WOMENS 50+ ADVANTAGE PO) Take 1 tablet by mouth daily.   NON FORMULARY 2 each. Calm   NON FORMULARY Focus factor   omeprazole (PRILOSEC) 40 MG capsule TAKE 1 CAPSULE BY MOUTH EVERY DAY BEFORE A MEAL   telmisartan-hydrochlorothiazide (MICARDIS HCT) 40-12.5 MG tablet TAKE 1 TABLET BY MOUTH DAILY   Turmeric 500 MG CAPS Take 1  capsule by mouth daily.    cetirizine (ZYRTEC) 10 MG tablet Take 10 mg by mouth at bedtime. (Patient not taking: Reported on 10/23/2019)   No facility-administered encounter medications on file as of 12/27/2019.   Current Diagnosis/Assessment:  SDOH Interventions     Most Recent Value  SDOH Interventions  Financial Strain Interventions Intervention Not Indicated     Goals Addressed            This Visit's Progress    Pharmacy Care Plan        CARE PLAN ENTRY (see longitudinal plan of care for additional care plan information)  Current Barriers:   Chronic Disease Management support, education, and care coordination needs related to Hypertension, Hyperlipidemia, and Diabetes   Hypertension BP Readings from Last 3 Encounters:  10/23/19 128/72  06/27/19 (!) 154/74  06/20/19 126/78    Pharmacist Clinical Goal(s): o Over the next 90 days, patient will work with PharmD and providers to maintain BP goal <130/80  Current regimen:  o Telmisartan-HCTZ 40-12.5 mg daily  Interventions: o Recommend patient check blood pressure periodically and if symptomatic o Provided dietary and exercise recommendations - PLATE method discussed  Patient self care activities - Over the next 90 days, patient will: o Check BP periodically and if symptomatic, document, and provide at future appointments o Ensure daily salt intake < 2300 mg/day o Exercise at least 30 minutes daily, 5 times per week o Try PLATE method for meal planning  Hyperlipidemia Lab Results  Component Value Date/Time   LDLCALC 110 (H) 10/23/2019 03:31 PM    Pharmacist Clinical Goal(s): o Over the next 180 days, patient will work with PharmD and providers to maintain LDL goal < 70  Current regimen:  o Livalo $Remove'4mg'KnujlAr$  daily  Interventions: o Provided dietary and exercise recommendations  Patient self care activities - Over the next 180 days, patient will: o Take cholesterol medication daily o Exercise at least 30 minutes daily, 5 times per week  Diabetes Lab Results  Component Value Date/Time   HGBA1C 7.4 (H) 10/23/2019 03:34 PM   HGBA1C 7.2 (H) 06/20/2019 05:32 PM    Pharmacist Clinical Goal(s): o Over the next 90 days, patient will work with PharmD and providers to achieve A1c goal <7%  Current regimen:   Xultophy 100-3.6 unit-mg/mL 25 units into skin daily  Metformin 500 mg daily with dinner  Interventions: o Provided dietary and exercise  recommendations o Discussed appropriate goals for fasting blood sugar (80-130) and 2 hours after eating (less than 180)  Patient self care activities - Over the next 90 days, patient will: o Check blood sugar twice daily and at bedtime, document, and provide at future appointments o Contact provider with any episodes of hypoglycemia o Exercise at least 30 minutes daily, 5 times per week  Allergies  Pharmacist Clinical Goal(s): o Over the next 90 days, patient will work with PharmD and providers to control allergy symptoms  Current regimen:   Fluticasone nasal spray 2 sprays in both nostrils daily  Loratadine 10 mg daily in the morning  Interventions: o Determined patient has continued Claritin daily (stopped Zyrtec) and states that her sinuses are good  Patient self care activities - Over the next 90 days, patient will: o Continue loratadine daily o Monitor allergy symptoms and be aware of blurry vision and dizziness  Medication management  Pharmacist Clinical Goal(s): o Over the next 90 days, patient will work with PharmD and providers to maintain optimal medication adherence  Current pharmacy: [facility]  Interventions o Comprehensive medication review performed. o Continue current medication management strategy o Determined patient is using Focus Factor memory supplement. Has not noticed significant difference yet but will give it a few months.   Patient self care activities - Over the next 90 days, patient will: o Focus on medication adherence by continued use of pill box o Take medications as prescribed o Report any questions or concerns to PharmD and/or provider(s)  Please see past updates related to this goal by clicking on the "Past Updates" button in the selected goal        Hyperlipidemia   LDL goal < 70  Lipid Panel     Component Value Date/Time   CHOL 186 10/23/2019 1531   TRIG 109 10/23/2019 1531   HDL 56 10/23/2019 1531   LDLCALC 110 (H)  10/23/2019 1531    Hepatic Function Latest Ref Rng & Units 10/23/2019 10/12/2018 05/15/2018  Total Protein 6.0 - 8.5 g/dL 7.9 7.1 7.5  Albumin 3.8 - 4.8 g/dL 4.5 4.4 4.4  AST 0 - 40 IU/L $Remov'12 17 12  'BMZinF$ ALT 0 - 32 IU/L $Remov'12 16 13  'WWmTli$ Alk Phosphatase 48 - 121 IU/L 133(H) 94 106  Total Bilirubin 0.0 - 1.2 mg/dL 0.3 <0.2 <0.2    The 10-year ASCVD risk score Mikey Bussing DC Jr., et al., 2013) is: 22.5%   Values used to calculate the score:     Age: 69 years     Sex: Female     Is Non-Hispanic African American: Yes     Diabetic: Yes     Tobacco smoker: No     Systolic Blood Pressure: 154 mmHg     Is BP treated: Yes     HDL Cholesterol: 56 mg/dL     Total Cholesterol: 186 mg/dL   Patient has failed these meds in past: N/A Patient is currently controlled on the following medications:  Livalo 4 mg daily  Aspirin EC 81 mg daily  Plan Continue current medications  Diabetes   Recent Relevant Labs: Lab Results  Component Value Date/Time   HGBA1C 7.4 (H) 10/23/2019 03:34 PM   HGBA1C 7.2 (H) 06/20/2019 05:32 PM   MICROALBUR 10 10/23/2019 03:42 PM   MICROALBUR 10 10/12/2018 04:36 PM    Checking BG: 2x per Day  Recent FBG Readings: Highest in the morning was 137, lowest was 79 (usually 97-107) Recent pre-meal BG readings:  Recent 2hr PP BG readings:   Recent HS BG readings:  Patient has failed these meds in past: Levemir, Victoza, pioglitazone Patient is currently uncontrolled on the following medications:   Xultophy 100-3.6 unit-mg/mL 25 units into skin daily  Metformin 500 mg daily with dinner  Last diabetic Foot exam: 10/23/19 Last diabetic Eye exam: Lab Results  Component Value Date/Time   HMDIABEYEEXA No Retinopathy 09/25/2019 12:00 AM   We discussed:   Goal FBG <130, 2hr PP <180, HgbA1c <7%  Felt jittery when BG was 79, she ate breakfast and felt better  Pt states she tries not to eat after 9pm  Diet extensively o Snacks: Apples, grapes, blackberries, cheese and  pepperonis o Dinner: Baked or fried chicken with cabbage, cole slaw, salad, string beans o Pt is staying away from corn o If she eats rice, it is brown rice  Exercise extensively o Crunches, weights, bending/stretching, leg lifts in chair o Walks dog every morning  Plan Continue current medications   Hypertension   Office blood pressures are  BP Readings from Last 3 Encounters:  10/23/19 128/72  06/27/19 (!) 154/74  06/20/19 126/78   Patient has failed these meds in the past: Losartan-HCTZ, olmesartan-HCTZ  Patient is currently controlled on the following medications:   Telmisartan-HCTZ 40-12.5 mg daily  Patient checks BP at home Does not check  Patient home BP readings are ranging: None to provide  We discussed:  Hardly ever has headaches  She feels good, so she doesn't check BP  Recommend check periodically and if symptomatic Diet extensively  Pt does not add any salt to food  Plan Continue current medications   Pain   Patient has failed these meds in past: Diclofenac, hydrocodone-APAP, meloxicam, ibuprofen Patient is currently controlled on the following medications:   Acetaminophen $RemoveBefore'1000mg'nUhZfvMbUsmgu$  every 6 hours a needed  Plan Continue current medications   Osteopenia / Osteoporosis   Last DEXA Scan: 01/2019  T-Score femoral neck: -1.3  T-Score lumbar spine: -0.9  10-year probability of major osteoporotic fracture: 3.8%  10-year probability of hip fracture: 0.4%  Vit D, 25-Hydroxy  Date Value Ref Range Status  10/23/2019 56.1 30.0 - 100.0 ng/mL Final    Comment:    Vitamin D deficiency has been defined by the [place] and an Endocrine Society practice guideline as a level of serum 25-OH vitamin D less than 20 ng/mL (1,2). The Endocrine Society went on to further define vitamin D insufficiency as a level between 21 and 29 ng/mL (2). 1. IOM (Institute of Medicine). 2010. Dietary reference    intakes for calcium and D. [place]: The     [employer]. 2. Holick MF, Binkley Quail, Bischoff-Ferrari HA, et al.    Evaluation, treatment, and prevention of vitamin D    deficiency: an Endocrine Society clinical practice    guideline. JCEM. 2011 Jul; 96(7):1911-30.      Patient is not a candidate for pharmacologic treatment  Patient has failed these meds in past: N/A Patient is currently controlled on the following medications:   Vitamin D 5000 units daily   We discussed:  Recommend weight-bearing and muscle strengthening exercises for building and maintaining bone density.   Pt reports that she has kept doing weight bearing exercises every other day   Plan Continue current medications   Allergies   Patient has failed these meds in past: N/A Patient is currently controlled on the following medications:   Fluticasone nasal spray 2 sprays in both nostrils daily  Loratadine 10 mg daily in the morning  We discussed:  Sinuses are doing fine with just taking Claritin daily (stopped Zyrtec back in July)  Plan Continue current medications  Indigestion   Patient has failed these meds in past: N/A Patient is currently controlled on the following medications:   Omeprazole 40 mg daily   We discussed:  Pt started taking omeprazole 30-60 minutes before breakfast as recommended  Pt states her stomach cramps less  Plan Continue current medications  Over the counter medications   Patient is currently  on the following medications:   Apple Cider vinegar 2 nightly  Turmeric 500 mg capsule daily   Vitamin C daily   Biotin 10 mg daily   Women's multivitamin daily   Focus factor (nutrition for the brain) daily  We discussed:    Pt has been taking Focus factor for about 1 month and has noticed minimal difference but it going to give it some more time  Plan Continue current medications   Vaccines   Reviewed and discussed patient's vaccination history.    Immunization History  Administered  Date(s) Administered   Fluad Quad(high Dose 65+) 01/01/2019   Influenza, High Dose Seasonal PF 01/12/2018, 01/01/2019   PFIZER SARS-COV-2 Vaccination 04/26/2019, 05/18/2019   Pneumococcal Conjugate-13 10/13/2018   Pneumococcal Polysaccharide-23 03/02/2017   Tdap 09/18/2015   Zoster Recombinat (Shingrix) 11/22/2018   We discussed:  Advised pt to get flu shot now and the COVID booster in October  Plan No recommendations at this time  Medication Management   Pt uses [facility] for all medications Uses pill box? Yes one for day and one for night Pt endorses 100% compliance  We discussed:   Importance of medication adherence daily  Plan Continue current medication management strategy  Discuss medication synchronization and adherence packaging at follow up  Follow up: 12 week phone visit  Jannette Fogo, PharmD Clinical Pharmacist [facility] [phone]

## 2020-01-03 ENCOUNTER — Telehealth: Payer: Self-pay

## 2020-01-03 NOTE — Patient Instructions (Addendum)
Visit Information  Goals Addressed            This Visit's Progress   . Pharmacy Care Plan       CARE PLAN ENTRY (see longitudinal plan of care for additional care plan information)  Current Barriers:  . Chronic Disease Management support, education, and care coordination needs related to Hypertension, Hyperlipidemia, and Diabetes   Hypertension BP Readings from Last 3 Encounters:  10/23/19 128/72  06/27/19 (!) 154/74  06/20/19 126/78   . Pharmacist Clinical Goal(s): o Over the next 90 days, patient will work with PharmD and providers to maintain BP goal <130/80 . Current regimen:  o Telmisartan-HCTZ 40-12.5 mg daily . Interventions: o Recommend patient check blood pressure periodically and if symptomatic o Provided dietary and exercise recommendations - PLATE method discussed . Patient self care activities - Over the next 90 days, patient will: o Check BP periodically and if symptomatic, document, and provide at future appointments o Ensure daily salt intake < 2300 mg/day o Exercise at least 30 minutes daily, 5 times per week o Try PLATE method for meal planning  Hyperlipidemia Lab Results  Component Value Date/Time   LDLCALC 110 (H) 10/23/2019 03:31 PM   . Pharmacist Clinical Goal(s): o Over the next 180 days, patient will work with PharmD and providers to maintain LDL goal < 70 . Current regimen:  o Livalo 4mg  daily . Interventions: o Provided dietary and exercise recommendations . Patient self care activities - Over the next 180 days, patient will: o Take cholesterol medication daily o Exercise at least 30 minutes daily, 5 times per week  Diabetes Lab Results  Component Value Date/Time   HGBA1C 7.4 (H) 10/23/2019 03:34 PM   HGBA1C 7.2 (H) 06/20/2019 05:32 PM   . Pharmacist Clinical Goal(s): o Over the next 90 days, patient will work with PharmD and providers to achieve A1c goal <7% . Current regimen:   Xultophy 100-3.6 unit-mg/mL 25 units into skin  daily  Metformin 500 mg daily with dinner . Interventions: o Provided dietary and exercise recommendations o Discussed appropriate goals for fasting blood sugar (80-130) and 2 hours after eating (less than 180) . Patient self care activities - Over the next 90 days, patient will: o Check blood sugar twice daily and at bedtime, document, and provide at future appointments o Contact provider with any episodes of hypoglycemia o Exercise at least 30 minutes daily, 5 times per week  Allergies . Pharmacist Clinical Goal(s): o Over the next 90 days, patient will work with PharmD and providers to control allergy symptoms . Current regimen:  . Fluticasone nasal spray 2 sprays in both nostrils daily . Loratadine 10 mg daily in the morning . Interventions: o Determined patient has continued Claritin daily (stopped Zyrtec) and states that her sinuses are good . Patient self care activities - Over the next 90 days, patient will: o Continue loratadine daily o Monitor allergy symptoms and be aware of blurry vision and dizziness  Medication management . Pharmacist Clinical Goal(s): o Over the next 90 days, patient will work with PharmD and providers to maintain optimal medication adherence . Current pharmacy: [facility] . Interventions o Comprehensive medication review performed. o Continue current medication management strategy o Determined patient is using Focus Factor memory supplement. Has not noticed significant difference yet but will give it a few months.  . Patient self care activities - Over the next 90 days, patient will: o Focus on medication adherence by continued use of pill box o Take  medications as prescribed o Report any questions or concerns to PharmD and/or provider(s)  Please see past updates related to this goal by clicking on the "Past Updates" button in the selected goal         The patient verbalized understanding of instructions provided today and agreed to receive a  mailed copy of patient instruction and/or educational materials.  Telephone follow up appointment with pharmacy team member scheduled for: 03/26/20 @ 4:15 pm  Jannette Fogo, PharmD Clinical Pharmacist [facility] [phone]   Diabetes Mellitus and Nutrition, Adult When you have diabetes (diabetes mellitus), it is very important to have healthy eating habits because your blood sugar (glucose) levels are greatly affected by what you eat and drink. Eating healthy foods in the appropriate amounts, at about the same times every day, can help you:  Control your blood glucose.  Lower your risk of heart disease.  Improve your blood pressure.  Reach or maintain a healthy weight. Every person with diabetes is different, and each person has different needs for a meal plan. Your health care provider may recommend that you work with a diet and nutrition specialist (dietitian) to make a meal plan that is best for you. Your meal plan may vary depending on factors such as:  The calories you need.  The medicines you take.  Your weight.  Your blood glucose, blood pressure, and cholesterol levels.  Your activity level.  Other health conditions you have, such as heart or kidney disease. How do carbohydrates affect me? Carbohydrates, also called carbs, affect your blood glucose level more than any other type of food. Eating carbs naturally raises the amount of glucose in your blood. Carb counting is a method for keeping track of how many carbs you eat. Counting carbs is important to keep your blood glucose at a healthy level, especially if you use insulin or take certain oral diabetes medicines. It is important to know how many carbs you can safely have in each meal. This is different for every person. Your dietitian can help you calculate how many carbs you should have at each meal and for each snack. Foods that contain carbs include:  Bread, cereal, rice, pasta, and  crackers.  Potatoes and corn.  Peas, beans, and lentils.  Milk and yogurt.  Fruit and juice.  Desserts, such as cakes, cookies, ice cream, and candy. How does alcohol affect me? Alcohol can cause a sudden decrease in blood glucose (hypoglycemia), especially if you use insulin or take certain oral diabetes medicines. Hypoglycemia can be a life-threatening condition. Symptoms of hypoglycemia (sleepiness, dizziness, and confusion) are similar to symptoms of having too much alcohol. If your health care provider says that alcohol is safe for you, follow these guidelines:  Limit alcohol intake to no more than 1 drink per day for nonpregnant women and 2 drinks per day for men. One drink equals 12 oz of beer, 5 oz of wine, or 1 oz of hard liquor.  Do not drink on an empty stomach.  Keep yourself hydrated with water, diet soda, or unsweetened iced tea.  Keep in mind that regular soda, juice, and other mixers may contain a lot of sugar and must be counted as carbs. What are tips for following this plan?  Reading food labels  Start by checking the serving size on the "Nutrition Facts" label of packaged foods and drinks. The amount of calories, carbs, fats, and other nutrients listed on the label is based on one serving of the  item. Many items contain more than one serving per package.  Check the total grams (g) of carbs in one serving. You can calculate the number of servings of carbs in one serving by dividing the total carbs by 15. For example, if a food has 30 g of total carbs, it would be equal to 2 servings of carbs.  Check the number of grams (g) of saturated and trans fats in one serving. Choose foods that have low or no amount of these fats.  Check the number of milligrams (mg) of salt (sodium) in one serving. Most people should limit total sodium intake to less than 2,300 mg per day.  Always check the nutrition information of foods labeled as "low-fat" or "nonfat". These foods may be  higher in added sugar or refined carbs and should be avoided.  Talk to your dietitian to identify your daily goals for nutrients listed on the label. Shopping  Avoid buying canned, premade, or processed foods. These foods tend to be high in fat, sodium, and added sugar.  Shop around the outside edge of the grocery store. This includes fresh fruits and vegetables, bulk grains, fresh meats, and fresh dairy. Cooking  Use low-heat cooking methods, such as baking, instead of high-heat cooking methods like deep frying.  Cook using healthy oils, such as olive, canola, or sunflower oil.  Avoid cooking with butter, cream, or high-fat meats. Meal planning  Eat meals and snacks regularly, preferably at the same times every day. Avoid going long periods of time without eating.  Eat foods high in fiber, such as fresh fruits, vegetables, beans, and whole grains. Talk to your dietitian about how many servings of carbs you can eat at each meal.  Eat 4-6 ounces (oz) of lean protein each day, such as lean meat, chicken, fish, eggs, or tofu. One oz of lean protein is equal to: ? 1 oz of meat, chicken, or fish. ? 1 egg. ?  cup of tofu.  Eat some foods each day that contain healthy fats, such as avocado, nuts, seeds, and fish. Lifestyle  Check your blood glucose regularly.  Exercise regularly as told by your health care provider. This may include: ? 150 minutes of moderate-intensity or vigorous-intensity exercise each week. This could be brisk walking, biking, or water aerobics. ? Stretching and doing strength exercises, such as yoga or weightlifting, at least 2 times a week.  Take medicines as told by your health care provider.  Do not use any products that contain nicotine or tobacco, such as cigarettes and e-cigarettes. If you need help quitting, ask your health care provider.  Work with a Social worker or diabetes educator to identify strategies to manage stress and any emotional and social  challenges. Questions to ask a health care provider  Do I need to meet with a diabetes educator?  Do I need to meet with a dietitian?  What number can I call if I have questions?  When are the best times to check my blood glucose? Where to find more information:  American Diabetes Association: diabetes.org  Academy of Nutrition and Dietetics: www.eatright.[employer] of Diabetes and Digestive and Kidney Diseases (NIH): [employer] Summary  A healthy meal plan will help you control your blood glucose and maintain a healthy lifestyle.  Working with a diet and nutrition specialist (dietitian) can help you make a meal plan that is best for you.  Keep in mind that carbohydrates (carbs) and alcohol have immediate effects on your blood glucose levels. It  is important to count carbs and to use alcohol carefully. This information is not intended to replace advice given to you by your health care provider. Make sure you discuss any questions you have with your health care provider. Document Revised: 03/04/2017 Document Reviewed: 04/26/2016 Elsevier Patient Education  2020 [employer].

## 2020-01-03 NOTE — Telephone Encounter (Signed)
Patient notified samples of livilo 4mg  ready for pickup.

## 2020-01-05 ENCOUNTER — Other Ambulatory Visit: Payer: Self-pay | Admitting: Internal Medicine

## 2020-01-09 ENCOUNTER — Other Ambulatory Visit: Payer: Self-pay | Admitting: Internal Medicine

## 2020-01-15 ENCOUNTER — Other Ambulatory Visit: Payer: Self-pay | Admitting: Internal Medicine

## 2020-01-15 DIAGNOSIS — Z1231 Encounter for screening mammogram for malignant neoplasm of breast: Secondary | ICD-10-CM

## 2020-01-16 ENCOUNTER — Ambulatory Visit
Admission: RE | Admit: 2020-01-16 | Discharge: 2020-01-16 | Disposition: A | Discharge status: Home / Self Care | Payer: Medicare Other | Source: Ambulatory Visit | Attending: Internal Medicine | Admitting: Internal Medicine

## 2020-01-16 ENCOUNTER — Other Ambulatory Visit: Payer: Self-pay

## 2020-01-16 DIAGNOSIS — Z1231 Encounter for screening mammogram for malignant neoplasm of breast: Secondary | ICD-10-CM | POA: Diagnosis not present

## 2020-01-19 ENCOUNTER — Other Ambulatory Visit: Payer: Self-pay | Admitting: Internal Medicine

## 2020-01-19 DIAGNOSIS — R490 Dysphonia: Secondary | ICD-10-CM

## 2020-01-22 DIAGNOSIS — Z86010 Personal history of colonic polyps: Secondary | ICD-10-CM | POA: Diagnosis not present

## 2020-01-22 DIAGNOSIS — K219 Gastro-esophageal reflux disease without esophagitis: Secondary | ICD-10-CM | POA: Diagnosis not present

## 2020-01-22 DIAGNOSIS — K5730 Diverticulosis of large intestine without perforation or abscess without bleeding: Secondary | ICD-10-CM | POA: Diagnosis not present

## 2020-01-22 DIAGNOSIS — Z800 Family history of malignant neoplasm of digestive organs: Secondary | ICD-10-CM | POA: Diagnosis not present

## 2020-01-22 DIAGNOSIS — Z1211 Encounter for screening for malignant neoplasm of colon: Secondary | ICD-10-CM | POA: Diagnosis not present

## 2020-01-23 ENCOUNTER — Ambulatory Visit (INDEPENDENT_AMBULATORY_CARE_PROVIDER_SITE_OTHER): Payer: Medicare Other | Admitting: Internal Medicine

## 2020-01-23 ENCOUNTER — Other Ambulatory Visit: Payer: Self-pay

## 2020-01-23 ENCOUNTER — Encounter: Payer: Self-pay | Admitting: Internal Medicine

## 2020-01-23 ENCOUNTER — Telehealth: Payer: Self-pay

## 2020-01-23 VITALS — BP 138/64 | HR 85 | Temp 97.7°F | Ht 62.4 in | Wt 179.6 lb

## 2020-01-23 DIAGNOSIS — Z6832 Body mass index (BMI) 32.0-32.9, adult: Secondary | ICD-10-CM

## 2020-01-23 DIAGNOSIS — E7800 Pure hypercholesterolemia, unspecified: Secondary | ICD-10-CM | POA: Diagnosis not present

## 2020-01-23 DIAGNOSIS — E6609 Other obesity due to excess calories: Secondary | ICD-10-CM

## 2020-01-23 DIAGNOSIS — E1122 Type 2 diabetes mellitus with diabetic chronic kidney disease: Secondary | ICD-10-CM | POA: Diagnosis not present

## 2020-01-23 DIAGNOSIS — N182 Chronic kidney disease, stage 2 (mild): Secondary | ICD-10-CM | POA: Diagnosis not present

## 2020-01-23 DIAGNOSIS — I129 Hypertensive chronic kidney disease with stage 1 through stage 4 chronic kidney disease, or unspecified chronic kidney disease: Secondary | ICD-10-CM | POA: Diagnosis not present

## 2020-01-23 NOTE — Progress Notes (Signed)
I,[name],acting as a Education administrator for Maximino Greenland, MD.,have documented all relevant documentation on the behalf of Maximino Greenland, MD,as directed by  Maximino Greenland, MD while in the presence of Maximino Greenland, MD.  This visit occurred during the SARS-CoV-2 public health emergency.  Safety protocols were in place, including screening questions prior to the visit, additional usage of staff PPE, and extensive cleaning of exam room while observing appropriate contact time as indicated for disinfecting solutions.  Subjective:     Patient ID: April Larson , female    DOB: 1951-02-03 , 69 y.o.   MRN: [id number]   Chief Complaint  Patient presents with  . Diabetes  . Hypertension    HPI  The patient is here today for a follow-up on her diabetes/blood pressure. She reports compliance with meds. She denies headaches, chest pain and palpitations. She reports her sugars are going up - admits she is eating late at night. 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   Diabetes She presents for her follow-up diabetic visit. She has type 2 diabetes mellitus. Pertinent negatives for hypoglycemia include no headaches. Pertinent negatives for diabetes include no blurred vision and no chest pain. Risk factors for coronary artery disease include diabetes mellitus, dyslipidemia, hypertension, post-menopausal and sedentary lifestyle. She participates in exercise intermittently. Her breakfast blood glucose is taken between 8-9 am. Her breakfast blood glucose range is generally 70-90 mg/dl. An ACE inhibitor/angiotensin II receptor blocker is being taken. Eye exam is current.  Hypertension This is a chronic problem. The current episode started more than 1 year ago. Condition status: FAIR CONTROL. Pertinent negatives include no anxiety, blurred vision, chest pain or headaches. The current treatment provides moderate improvement.     Past Medical History:  Diagnosis Date  . Diabetes  mellitus without complication ([place])   . Hypertension   . Substance abuse ([place])      Family History  Problem Relation Age of Onset  . Cancer Mother   . Diabetes Father   . Cancer Brother   . Breast cancer Maternal Aunt      Current Outpatient Medications:  .  ACCU-CHEK FASTCLIX LANCETS MISC, USE TO CHECK BLOOD SUGARS TWICE DAILY, Disp: 100 each, Rfl: 11 .  ACCU-CHEK GUIDE test strip, USE TO CHECK BLOOD SUGARS TWICE DAILY AS DIRECTED, Disp: 100 each, Rfl: 11 .  Ascorbic Acid (VITAMIN C PO), Take 1 tablet by mouth daily., Disp: , Rfl:  .  aspirin EC 81 MG tablet, Take 81 mg by mouth daily., Disp: , Rfl:  .  Biotin 10 MG CAPS, Take 1 tablet by mouth daily., Disp: , Rfl:  .  calcium carbonate (OSCAL) 1500 (600 Ca) MG TABS tablet, Take 600 mg of elemental calcium by mouth daily with breakfast., Disp: , Rfl:  .  cetirizine (ZYRTEC) 10 MG tablet, Take 10 mg by mouth at bedtime. , Disp: , Rfl:  .  Cholecalciferol 125 MCG (5000 UT) TABS, Take 5,000 Units by mouth daily. , Disp: , Rfl:  .  Insulin Pen Needle (B-D ULTRAFINE III SHORT PEN) 31G X 8 MM MISC, USE AS DIRECTED TWICE DAILY, Disp: 100 each, Rfl: 11 .  LIVALO 4 MG TABS, TAKE 1 TABLET BY MOUTH DAILY, Disp: 90 tablet, Rfl: 1 .  loratadine (CLARITIN) 10 MG tablet, Take 10 mg by mouth daily. AM, Disp: , Rfl:  .  metFORMIN (GLUCOPHAGE) 500 MG tablet, Take 1 tablet (500 mg total) by mouth daily with breakfast. (Patient  taking differently: Take 500 mg by mouth daily with breakfast. Dinner), Disp: 90 tablet, Rfl: 1 .  Multiple Vitamins-Minerals (ONE-A-DAY WOMENS 50+ ADVANTAGE PO), Take 1 tablet by mouth daily., Disp: , Rfl:  .  NON FORMULARY, 2 each. Calm, Disp: , Rfl:  .  NON FORMULARY, Focus factor, Disp: , Rfl:  .  omeprazole (PRILOSEC) 40 MG capsule, TAKE 1 CAPSULE BY MOUTH EVERY DAY BEFORE A MEAL, Disp: 90 capsule, Rfl: 1 .  telmisartan-hydrochlorothiazide (MICARDIS HCT) 40-12.5 MG tablet, TAKE 1 TABLET BY MOUTH DAILY, Disp: 90 tablet, Rfl:  1 .  Turmeric 500 MG CAPS, Take 1 capsule by mouth daily. , Disp: , Rfl:  .  XULTOPHY 100-3.6 UNIT-MG/ML SOPN, INJECT 25 UNITS INTO THE SKIN DAILY, Disp: 27 mL, Rfl: 3 .  APPLE CIDER VINEGAR PO, Take by mouth. 2 gummies at night (Patient not taking: Reported on 01/23/2020), Disp: , Rfl:  .  fluticasone (FLONASE) 50 MCG/ACT nasal spray, SHAKE LIQUID AND USE 2 SPRAYS IN EACH NOSTRIL DAILY, Disp: 16 g, Rfl: 2   No Known Allergies   Review of Systems  Constitutional: Negative.   Eyes: Negative for blurred vision.  Respiratory: Negative.   Cardiovascular: Negative.  Negative for chest pain.  Gastrointestinal: Negative.   Neurological: Negative for headaches.  Psychiatric/Behavioral: Negative.   All other systems reviewed and are negative.    Today's Vitals   01/23/20 1418  BP: 138/64  Pulse: 85  Temp: 97.7 F (36.5 C)  TempSrc: Oral  Weight: 179 lb 9.6 oz (81.5 kg)  Height: 5' 2.4" (1.585 m)  PainSc: 0-No pain   Body mass index is 32.43 kg/m.  Wt Readings from Last 3 Encounters:  01/23/20 179 lb 9.6 oz (81.5 kg)  10/23/19 176 lb 6.4 oz (80 kg)  06/27/19 178 lb (80.7 kg)   Objective:  Physical Exam Vitals and nursing note reviewed.  Constitutional:      Appearance: Normal appearance. She is obese.  HENT:     Head: Normocephalic and atraumatic.  Cardiovascular:     Rate and Rhythm: Normal rate and regular rhythm.     Heart sounds: Normal heart sounds.  Pulmonary:     Breath sounds: Normal breath sounds.  Skin:    General: Skin is warm.  Neurological:     General: No focal deficit present.     Mental Status: She is alert and oriented to person, place, and time.         Assessment And Plan:     1. Diabetes mellitus with stage 2 chronic kidney disease ([place]) Comments: Chronic, I will check labs as listed below.  She is encouraged to incorporate more exercise into her daily routine.  - BMP8+EGFR - Hemoglobin A1c  2. Benign hypertension with chronic kidney disease,  stage II Comments: Chronic, fair control.  She is aware that optimal BP is less than 130/80. Advised to follow low sodium diet.   3. Pure hypercholesterolemia Comments: Chronic, currently on Livalo. Unfortunately, has not tolerated other statins. Advised to avoid fried foods, decrease intake of saturated fats and to exercise regularly.   4. Class 1 obesity due to excess calories with serious comorbidity and body mass index (BMI) of 32.0 to 32.9 in adult Comments: Encouraged to strive for BMI less than 29 to decrease cardiac risk. Advised to aim for at least 150 minutes of exercise per week.    Patient was given opportunity to ask questions. Patient verbalized understanding of the plan and was able to repeat key  elements of the plan. All questions were answered to their satisfaction.  Maximino Greenland, MD   I, Maximino Greenland, MD, have reviewed all documentation for this visit. The documentation on 01/23/20 for the exam, diagnosis, procedures, and orders are all accurate and complete.  THE PATIENT IS ENCOURAGED TO PRACTICE SOCIAL DISTANCING DUE TO THE COVID-19 PANDEMIC.

## 2020-01-23 NOTE — Telephone Encounter (Signed)
error 

## 2020-01-23 NOTE — Patient Instructions (Signed)
Diabetes Mellitus and Exercise Exercising regularly is important for your overall health, especially when you have diabetes (diabetes mellitus). Exercising is not only about losing weight. It has many other health benefits, such as increasing muscle strength and bone density and reducing body fat and stress. This leads to improved fitness, flexibility, and endurance, all of which result in better overall health. Exercise has additional benefits for people with diabetes, including:  Reducing appetite.  Helping to lower and control blood glucose.  Lowering blood pressure.  Helping to control amounts of fatty substances (lipids) in the blood, such as cholesterol and triglycerides.  Helping the body to respond better to insulin (improving insulin sensitivity).  Reducing how much insulin the body needs.  Decreasing the risk for heart disease by: ? Lowering cholesterol and triglyceride levels. ? Increasing the levels of good cholesterol. ? Lowering blood glucose levels. What is my activity plan? Your health care provider or certified diabetes educator can help you make a plan for the type and frequency of exercise (activity plan) that works for you. Make sure that you:  Do at least 150 minutes of moderate-intensity or vigorous-intensity exercise each week. This could be brisk walking, biking, or water aerobics. ? Do stretching and strength exercises, such as yoga or weightlifting, at least 2 times a week. ? Spread out your activity over at least 3 days of the week.  Get some form of physical activity every day. ? Do not go more than 2 days in a row without some kind of physical activity. ? Avoid being inactive for more than 30 minutes at a time. Take frequent breaks to walk or stretch.  Choose a type of exercise or activity that you enjoy, and set realistic goals.  Start slowly, and gradually increase the intensity of your exercise over time. What do I need to know about managing my  diabetes?   Check your blood glucose before and after exercising. ? If your blood glucose is 240 mg/dL (13.3 mmol/L) or higher before you exercise, check your urine for ketones. If you have ketones in your urine, do not exercise until your blood glucose returns to normal. ? If your blood glucose is 100 mg/dL (5.6 mmol/L) or lower, eat a snack containing 15-20 grams of carbohydrate. Check your blood glucose 15 minutes after the snack to make sure that your level is above 100 mg/dL (5.6 mmol/L) before you start your exercise.  Know the symptoms of low blood glucose (hypoglycemia) and how to treat it. Your risk for hypoglycemia increases during and after exercise. Common symptoms of hypoglycemia can include: ? Hunger. ? Anxiety. ? Sweating and feeling clammy. ? Confusion. ? Dizziness or feeling light-headed. ? Increased heart rate or palpitations. ? Blurry vision. ? Tingling or numbness around the mouth, lips, or tongue. ? Tremors or shakes. ? Irritability.  Keep a rapid-acting carbohydrate snack available before, during, and after exercise to help prevent or treat hypoglycemia.  Avoid injecting insulin into areas of the body that are going to be exercised. For example, avoid injecting insulin into: ? The arms, when playing tennis. ? The legs, when jogging.  Keep records of your exercise habits. Doing this can help you and your health care provider adjust your diabetes management plan as needed. Write down: ? Food that you eat before and after you exercise. ? Blood glucose levels before and after you exercise. ? The type and amount of exercise you have done. ? When your insulin is expected to peak, if you use   insulin. Avoid exercising at times when your insulin is peaking.  When you start a new exercise or activity, work with your health care provider to make sure the activity is safe for you, and to adjust your insulin, medicines, or food intake as needed.  Drink plenty of water while  you exercise to prevent dehydration or heat stroke. Drink enough fluid to keep your urine clear or pale yellow. Summary  Exercising regularly is important for your overall health, especially when you have diabetes (diabetes mellitus).  Exercising has many health benefits, such as increasing muscle strength and bone density and reducing body fat and stress.  Your health care provider or certified diabetes educator can help you make a plan for the type and frequency of exercise (activity plan) that works for you.  When you start a new exercise or activity, work with your health care provider to make sure the activity is safe for you, and to adjust your insulin, medicines, or food intake as needed. This information is not intended to replace advice given to you by your health care provider. Make sure you discuss any questions you have with your health care provider. Document Revised: 10/14/2016 Document Reviewed: 09/01/2015 Elsevier Patient Education  2020 [employer]  

## 2020-01-24 ENCOUNTER — Encounter: Payer: Self-pay | Admitting: Internal Medicine

## 2020-01-24 ENCOUNTER — Ambulatory Visit: Payer: Self-pay

## 2020-01-24 ENCOUNTER — Telehealth: Payer: Medicare Other

## 2020-01-24 DIAGNOSIS — E1122 Type 2 diabetes mellitus with diabetic chronic kidney disease: Secondary | ICD-10-CM

## 2020-01-24 DIAGNOSIS — N182 Chronic kidney disease, stage 2 (mild): Secondary | ICD-10-CM

## 2020-01-24 DIAGNOSIS — I129 Hypertensive chronic kidney disease with stage 1 through stage 4 chronic kidney disease, or unspecified chronic kidney disease: Secondary | ICD-10-CM

## 2020-01-24 LAB — BMP8+EGFR
BUN/Creatinine Ratio: 18 (ref 12–28)
BUN: 20 mg/dL (ref 8–27)
CO2: 22 mmol/L (ref 20–29)
Calcium: 9.6 mg/dL (ref 8.7–10.3)
Chloride: 101 mmol/L (ref 96–106)
Creatinine, Ser: 1.14 mg/dL — ABNORMAL HIGH (ref 0.57–1.00)
GFR calc Af Amer: 57 mL/min/{1.73_m2} — ABNORMAL LOW (ref >59)
GFR calc non Af Amer: 50 mL/min/{1.73_m2} — ABNORMAL LOW (ref >59)
Glucose: 185 mg/dL — ABNORMAL HIGH (ref 65–99)
Potassium: 4.4 mmol/L (ref 3.5–5.2)
Sodium: 138 mmol/L (ref 134–144)

## 2020-01-24 LAB — HEMOGLOBIN A1C
Est. average glucose Bld gHb Est-mCnc: 169 mg/dL
Hgb A1c MFr Bld: 7.5 % — ABNORMAL HIGH (ref 4.8–5.6)

## 2020-01-28 NOTE — Patient Instructions (Signed)
Visit Information  Goals Addressed      Patient Stated   .  "to keep my BP well controlled" (pt-stated)   On track     [place] (see longitudinal plan of care for additional care plan information)  Current Barriers:  Marland Kitchen Knowledge Deficits related to disease process and Self Health management of HTN . Chronic Disease Management support and education needs related to DM II with stage 2 CKD, Hypertensive Nephropathy  Nurse Case Manager Clinical Goal(s):  Marland Kitchen Over the next 90 days, patient will work with the CCM team and PCP to address needs related to disease education and support to improve Self Health management of HTN  CCM RN CM Interventions:  01/24/20 call completed with patient  . Inter-disciplinary care team collaboration (see longitudinal plan of care) . Evaluation of current treatment plan related to Hypertension and patient's adherence to plan as established by provider. Marland Kitchen Re-educated patient re: target BP <130/80; Educated on dietary and exercise recommendations; Reviewed and discussed OV BP 138/64 . Reviewed current medication regimen for HTN:  o Telmisartan-HCTZ 40-12.5 mg daily . Confirmed patient received and reviewed printed educational materials related to What is High Blood Pressure; Life's Simple 7; Why Should I Lower Sodium? . Advised patient, providing education and rationale, to monitor blood pressure daily and record, calling the CCM team and or PCP for findings outside established parameters . Discussed plans with patient for ongoing care management follow up and provided patient with direct contact information for care management team  Patient Self Care Activities:  . Self administers medications as prescribed . Attends all scheduled provider appointments . Calls pharmacy for medication refills . Performs ADL's independently . Performs IADL's independently . Calls provider office for new concerns or questions  Please see past updates related to this goal  by clicking on the "Past Updates" button in the selected goal      .  "to keep my diabetes under good control" (pt-stated)   Not on track     [place] (see longitudinal plan of care for additional care plan information)  Current Barriers:  Marland Kitchen Knowledge Deficits related to disease process and Self Health management of DM . Chronic Disease Management support and education needs related to DM II with stage 2 CKD, Hypertensive Nephropathy  Nurse Case Manager Clinical Goal(s):  . New 01/24/20 Over the next 180 days, patient will work with the CCM team and PCP to address needs related to disease education and support to help improve Self Health management of DM  CCM RN CM Interventions:  01/24/20 call completed with patient  . Inter-disciplinary care team collaboration (see longitudinal plan of care) . Provided education to patient and/or caregiver about advanced directives . Provided education to patient re: current A1c is noted to be 7.5; Educated on target A1c <7.0, Re-educated on daily glycemic control; FBS 80-130, <180 after meals; Re-educated on 15'15' rule; Re-Educated on recommendations for daily exercise, 30 min/5x week . Reviewed medications with patient and discussed patient understands and adheres to her pharmacological DM recommendations  Xultophy 100-3.6 unit-mg/mL 25 units into skin daily  Metformin 500 mg daily with dinne . Confirmed patient received and reviewed printed educational materials related to Diabetes Management using the Plate Method; Diabetes Care Schedule; Preventing Complications with Diabetes; Grocery Shopping with Diabetes  . Advised patient, providing education and rationale, to check cbg 1-2 times daily before meals and record, calling the CCM team and or PCP for findings outside established parameters .  Discussed plans with patient for ongoing care management follow up and provided patient with direct contact information for care management team    Patient Self Care Activities:  . Self administers medications as prescribed . Attends all scheduled provider appointments . Calls pharmacy for medication refills . Calls provider office for new concerns or questions  Please see past updates related to this goal by clicking on the "Past Updates" button in the selected goal        Patient verbalizes understanding of instructions provided today.   Telephone follow up appointment with care management team member scheduled for: 04/28/20  Barb Merino, RN, BSN, CCM Care Management Coordinator [facility]  Direct Phone: [phone]

## 2020-01-28 NOTE — Chronic Care Management (AMB) (Signed)
Chronic Care Management   Follow Up Note   01/24/2020 Name: April Larson MRN: [id number] DOB: 06-01-1950  Referred by: Glendale Chard, MD Reason for referral : Chronic Care Management (CCM RNCM FU Call )   April Larson is a 69 y.o. year old female who is a primary care patient of Glendale Chard, MD. The CCM team was consulted for assistance with chronic disease management and care coordination needs.    Review of patient status, including review of consultants reports, relevant laboratory and other test results, and collaboration with appropriate care team members and the patient's provider was performed as part of comprehensive patient evaluation and provision of chronic care management services.    SDOH (Social Determinants of Health) assessments performed: Yes - no acute challenges identified at this time  See Care Plan activities for detailed interventions related to April Larson)    Placed outbound CCM RN CM follow up call to patient for a care plan update.  Outpatient Encounter Medications as of 01/24/2020  Medication Sig  . ACCU-CHEK FASTCLIX LANCETS MISC USE TO CHECK BLOOD SUGARS TWICE DAILY  . ACCU-CHEK GUIDE test strip USE TO CHECK BLOOD SUGARS TWICE DAILY AS DIRECTED  . APPLE CIDER VINEGAR PO Take by mouth. 2 gummies at night (Patient not taking: Reported on 01/23/2020)  . Ascorbic Acid (VITAMIN C PO) Take 1 tablet by mouth daily.  April Larson aspirin EC 81 MG tablet Take 81 mg by mouth daily.  . Biotin 10 MG CAPS Take 1 tablet by mouth daily.  . calcium carbonate (OSCAL) 1500 (600 Ca) MG TABS tablet Take 600 mg of elemental calcium by mouth daily with breakfast.  . cetirizine (ZYRTEC) 10 MG tablet Take 10 mg by mouth at bedtime.   . Cholecalciferol 125 MCG (5000 UT) TABS Take 5,000 Units by mouth daily.   . fluticasone (FLONASE) 50 MCG/ACT nasal spray SHAKE LIQUID AND USE 2 SPRAYS IN EACH NOSTRIL DAILY  . Insulin Pen Needle (B-D ULTRAFINE III SHORT PEN)  31G X 8 MM MISC USE AS DIRECTED TWICE DAILY  . LIVALO 4 MG TABS TAKE 1 TABLET BY MOUTH DAILY  . loratadine (CLARITIN) 10 MG tablet Take 10 mg by mouth daily. AM  . metFORMIN (GLUCOPHAGE) 500 MG tablet Take 1 tablet (500 mg total) by mouth daily with breakfast. (Patient taking differently: Take 500 mg by mouth daily with breakfast. Dinner)  . Multiple Vitamins-Minerals (ONE-A-DAY WOMENS 50+ ADVANTAGE PO) Take 1 tablet by mouth daily.  . NON FORMULARY 2 each. Calm  . NON FORMULARY Focus factor  . omeprazole (PRILOSEC) 40 MG capsule TAKE 1 CAPSULE BY MOUTH EVERY DAY BEFORE A MEAL  . telmisartan-hydrochlorothiazide (MICARDIS HCT) 40-12.5 MG tablet TAKE 1 TABLET BY MOUTH DAILY  . Turmeric 500 MG CAPS Take 1 capsule by mouth daily.   [name] 100-3.6 UNIT-MG/ML SOPN INJECT 25 UNITS INTO THE SKIN DAILY   No facility-administered encounter medications on file as of 01/24/2020.     Objective:  Lab Results  Component Value Date   HGBA1C 7.5 (H) 01/23/2020   HGBA1C 7.4 (H) 10/23/2019   HGBA1C 7.2 (H) 06/20/2019   Lab Results  Component Value Date   MICROALBUR 10 10/23/2019   LDLCALC 110 (H) 10/23/2019   CREATININE 1.14 (H) 01/23/2020   BP Readings from Last 3 Encounters:  01/23/20 138/64  10/23/19 128/72  06/27/19 (!) 154/74    Goals Addressed      Patient Stated   .  "to keep my BP well  controlled" (pt-stated)   On track     [place] (see longitudinal plan of care for additional care plan information)  Current Barriers:  April Larson Knowledge Deficits related to disease process and Self Health management of HTN . Chronic Disease Management support and education needs related to DM II with stage 2 CKD, Hypertensive Nephropathy  Nurse Case Manager Clinical Goal(s):  April Larson Over the next 90 days, patient will work with the CCM team and PCP to address needs related to disease education and support to improve Self Health management of HTN  CCM RN CM Interventions:  01/24/20 call completed  with patient  . Inter-disciplinary care team collaboration (see longitudinal plan of care) . Evaluation of current treatment plan related to Hypertension and patient's adherence to plan as established by provider. April Larson Re-educated patient re: target BP <130/80; Educated on dietary and exercise recommendations; Reviewed and discussed OV BP 138/64 . Reviewed current medication regimen for HTN:  o Telmisartan-HCTZ 40-12.5 mg daily . Confirmed patient received and reviewed printed educational materials related to What is High Blood Pressure; Life's Simple 7; Why Should I Lower Sodium? . Advised patient, providing education and rationale, to monitor blood pressure daily and record, calling the CCM team and or PCP for findings outside established parameters . Discussed plans with patient for ongoing care management follow up and provided patient with direct contact information for care management team  Patient Self Care Activities:  . Self administers medications as prescribed . Attends all scheduled provider appointments . Calls pharmacy for medication refills . Performs ADL's independently . Performs IADL's independently . Calls provider office for new concerns or questions  Please see past updates related to this goal by clicking on the "Past Updates" button in the selected goal      .  "to keep my diabetes under good control" (pt-stated)   Not on track     [name] (see longitudinal plan of care for additional care plan information)  Current Barriers:  April Larson Knowledge Deficits related to disease process and Self Health management of DM . Chronic Disease Management support and education needs related to DM II with stage 2 CKD, Hypertensive Nephropathy  Nurse Case Manager Clinical Goal(s):  . New 01/24/20 Over the next 180 days, patient will work with the CCM team and PCP to address needs related to disease education and support to help improve Self Health management of DM  CCM RN CM  Interventions:  01/24/20 call completed with patient  . Inter-disciplinary care team collaboration (see longitudinal plan of care) . Provided education to patient and/or caregiver about advanced directives . Provided education to patient re: current A1c is noted to be 7.5; Educated on target A1c <7.0, Re-educated on daily glycemic control; FBS 80-130, <180 after meals; Re-educated on 15'15' rule; Re-Educated on recommendations for daily exercise, 30 min/5x week . Reviewed medications with patient and discussed patient understands and adheres to her pharmacological DM recommendations  Xultophy 100-3.6 unit-mg/mL 25 units into skin daily  Metformin 500 mg daily with dinne . Confirmed patient received and reviewed printed educational materials related to Diabetes Management using the Plate Method; Diabetes Care Schedule; Preventing Complications with Diabetes; Grocery Shopping with Diabetes  . Advised patient, providing education and rationale, to check cbg 1-2 times daily before meals and record, calling the CCM team and or PCP for findings outside established parameters . Discussed plans with patient for ongoing care management follow up and provided patient with direct contact information for care management  team   Patient Self Care Activities:  . Self administers medications as prescribed . Attends all scheduled provider appointments . Calls pharmacy for medication refills . Calls provider office for new concerns or questions  Please see past updates related to this goal by clicking on the "Past Updates" button in the selected goal        Plan:   Telephone follow up appointment with care management team member scheduled for: 04/29/19  Barb Merino, RN, BSN, CCM Care Management Coordinator [place] [facility]  Direct Phone: [phone]

## 2020-02-01 DIAGNOSIS — Z03818 Encounter for observation for suspected exposure to other biological agents ruled out: Secondary | ICD-10-CM | POA: Diagnosis not present

## 2020-02-04 ENCOUNTER — Telehealth: Payer: Self-pay | Admitting: Pharmacist

## 2020-02-04 NOTE — Chronic Care Management (AMB) (Signed)
Chronic Care Management Pharmacy Assistant   Name: April Larson  MRN: [id number] DOB: [birth year]  Reason for Encounter: Disease State - Diabetes, Hypertension and Lipids Adherence Call.  Patient Questions:  1.  Have you seen any other providers since your last visit? Yes. 10/20/2021Glendale Chard ,MD - Diabetes - Hypertension visit   2.  Any changes in your medicines or health? No.    PCP : Glendale Chard, MD  Allergies:  No Known Allergies  Medications: Outpatient Encounter Medications as of 02/04/2067  Medication Sig  . ACCU-CHEK FASTCLIX LANCETS MISC USE TO CHECK BLOOD SUGARS TWICE DAILY  . ACCU-CHEK GUIDE test strip USE TO CHECK BLOOD SUGARS TWICE DAILY AS DIRECTED  . APPLE CIDER VINEGAR PO Take by mouth. 2 gummies at night (Patient not taking: Reported on 01/23/2020)  . Ascorbic Acid (VITAMIN C PO) Take 1 tablet by mouth daily.  Marland Kitchen aspirin EC 81 MG tablet Take 81 mg by mouth daily.  . Biotin 10 MG CAPS Take 1 tablet by mouth daily.  . calcium carbonate (OSCAL) 1500 (600 Ca) MG TABS tablet Take 600 mg of elemental calcium by mouth daily with breakfast.  . cetirizine (ZYRTEC) 10 MG tablet Take 10 mg by mouth at bedtime.   . Cholecalciferol 125 MCG (5000 UT) TABS Take 5,000 Units by mouth daily.   . fluticasone (FLONASE) 50 MCG/ACT nasal spray SHAKE LIQUID AND USE 2 SPRAYS IN EACH NOSTRIL DAILY  . Insulin Pen Needle (B-D ULTRAFINE III SHORT PEN) 31G X 8 MM MISC USE AS DIRECTED TWICE DAILY  . LIVALO 4 MG TABS TAKE 1 TABLET BY MOUTH DAILY  . loratadine (CLARITIN) 10 MG tablet Take 10 mg by mouth daily. AM  . metFORMIN (GLUCOPHAGE) 500 MG tablet Take 1 tablet (500 mg total) by mouth daily with breakfast. (Patient taking differently: Take 500 mg by mouth daily with breakfast. Dinner)  . Multiple Vitamins-Minerals (ONE-A-DAY WOMENS 50+ ADVANTAGE PO) Take 1 tablet by mouth daily.  . NON FORMULARY 2 each. Calm  . NON FORMULARY Focus factor  . omeprazole  (PRILOSEC) 40 MG capsule TAKE 1 CAPSULE BY MOUTH EVERY DAY BEFORE A MEAL  . telmisartan-hydrochlorothiazide (MICARDIS HCT) 40-12.5 MG tablet TAKE 1 TABLET BY MOUTH DAILY  . Turmeric 500 MG CAPS Take 1 capsule by mouth daily.   [name] 100-3.6 UNIT-MG/ML SOPN INJECT 25 UNITS INTO THE SKIN DAILY   No facility-administered encounter medications on file as of 02/04/2067.    Current Diagnosis: Patient Active Problem List   Diagnosis Date Noted  . Pure hypercholesterolemia 01/23/2020  . Vitamin D deficiency 10/23/2019  . Right hip pain 05/15/2018  . Estrogen deficiency 05/15/2018  . Class 1 obesity due to excess calories with serious comorbidity and body mass index (BMI) of 31.0 to 31.9 in adult 05/15/2018  . Diabetes mellitus with stage 2 chronic kidney disease ([place]) 01/11/2018  . Benign hypertension with chronic kidney disease, stage II 01/11/2018    Recent Relevant Labs: Lab Results  Component Value Date/Time   HGBA1C 7.5 (H) 01/23/2020 03:17 PM   HGBA1C 7.4 (H) 10/23/2019 03:34 PM   MICROALBUR 10 10/23/2019 03:42 PM   MICROALBUR 10 10/12/2018 04:36 PM    Kidney Function Lab Results  Component Value Date/Time   CREATININE 1.14 (H) 01/23/2020 03:17 PM   CREATININE 0.93 10/23/2019 03:34 PM   GFRNONAA 50 (L) 01/23/2020 03:17 PM   GFRAA 57 (L) 01/23/2020 03:17 PM    . Current antihyperglycemic regimen:  o [name]  100-3.6 unit - 25 units into skin daily o Metformin 500 mg daily with dinner. . What recent interventions/DTPs have been made to improve glycemic control:  o Patent states she takes her medications as directed by providers. . Have there been any recent hospitalizations or ED visits since last visit with CPP?  No   . Patient denies hypoglycemic symptoms, including Sweaty, Shaky, Hungry, Nervous/irritable and Vision changes . Patient denies hyperglycemic symptoms, including blurry vision, excessive thirst, fatigue, polyuria and weakness   . How often are you checking  your blood sugar? Patient states she takes twice a day   . What are your blood sugars ranging?  o Fasting: 02/04/2020- 111 o Fasting; 02/03/2020 - 112 o Fasting:02/02/2020 - 166 o Before meals: none o After meals: none o Bedtime:  none . During the week, how often does your blood glucose drop below 70 ? No  . Are you checking your feet daily/regularly? Yes, Patient denies any problems with feet. States she has one toe that sits on another toe on left foot.  Adherence Review: Is the patient currently on a STATIN medication?  Yes Is the patient currently on ACE/ARB medication? Yes Does the patient have >5 day gap between last estimated fill dates? {No  Reviewed chart prior to disease state call. Spoke with patient regarding BP  Recent Office Vitals: BP Readings from Last 3 Encounters:  01/23/20 138/64  10/23/19 128/72  06/27/19 (!) 154/74   Pulse Readings from Last 3 Encounters:  01/23/20 85  10/23/19 95  06/27/19 90    Wt Readings from Last 3 Encounters:  01/23/20 179 lb 9.6 oz (81.5 kg)  10/23/19 176 lb 6.4 oz (80 kg)  06/27/19 178 lb (80.7 kg)     Kidney Function Lab Results  Component Value Date/Time   CREATININE 1.14 (H) 01/23/2020 03:17 PM   CREATININE 0.93 10/23/2019 03:34 PM   GFRNONAA 50 (L) 01/23/2020 03:17 PM   GFRAA 57 (L) 01/23/2020 03:17 PM    BMP Latest Ref Rng & Units 01/23/2020 10/23/2019 06/20/2019  Glucose 65 - 99 mg/dL 185(H) 187(H) 138(H)  BUN 8 - 27 mg/dL 20 17 19   Creatinine 0.57 - 1.00 mg/dL 1.14(H) 0.93 1.04(H)  BUN/Creat Ratio 12 - 28 18 18 18   Sodium 134 - 144 mmol/L 138 138 138  Potassium 3.5 - 5.2 mmol/L 4.4 4.0 4.4  Chloride 96 - 106 mmol/L 101 100 101  CO2 20 - 29 mmol/L 22 24 23   Calcium 8.7 - 10.3 mg/dL 9.6 10.5(H) 10.3    . Current antihypertensive regimen:  o Telmisartan - HCTZ 40-12.5 mg daily  . How often are you checking your Blood Pressure? Patient states she does not take her blood pressure at home.   . Current home BP  readings: 138/70 was last reading.  . What recent interventions/DTPs have been made by any provider to improve Blood Pressure control since last CPP Visit: Patient states she takes her medications as directed by provider.  . Any recent hospitalizations or ED visits since last visit with CPP? No.  . What diet changes have been made to improve Blood Pressure Control?  o Patient states she eats apples , grapes, vegetables, chicken, she has been eating more healthier. . What exercise is being done to improve your Blood Pressure Control?  o Patient, states she walks dog three times a day . She is very active and she walks 15- 20 minutes a day.   Adherence Review: Is the patient currently on ACE/ARB  medication? Yes Does the patient have >5 day gap between last estimated fill dates? No     Comprehensive medication review performed; Spoke to patient regarding cholesterol  Lipid Panel    Component Value Date/Time   CHOL 186 10/23/2019 1531   TRIG 109 10/23/2019 1531   HDL 56 10/23/2019 1531   LDLCALC 110 (H) 10/23/2019 1531    10-year ASCVD risk score: The 10-year ASCVD risk score Mikey Bussing DC Brooke Bonito., et al., 2013) is: 25.9%   Values used to calculate the score:     Age: 53 years     Sex: Female     Is Non-Hispanic African American: Yes     Diabetic: Yes     Tobacco smoker: No     Systolic Blood Pressure: 270 mmHg     Is BP treated: Yes     HDL Cholesterol: 56 mg/dL     Total Cholesterol: 186 mg/dL  . Current antihyperlipidemic regimen:  o Livalo 4 mg Daily . Previous antihyperlipidemic medications tried: none  . ASCVD risk enhancing conditions:  Diabetic / Hypertension .  Marland Kitchen What recent interventions/DTPs have been made by any provider to improve Cholesterol control since last CPP Visit: Patient states she takes her medication as directed by provider.  . Any recent hospitalizations or ED visits since last visit with CPP?  No.  . What diet changes have been made to improve  Cholesterol?             Patient states she eats apples , grapes, vegetables, chicken, she has been             eating more healthier. . What exercise is being done to improve Cholesterol?                Patient, states she walks dog three times a day . She is very active and she                walks 15- 20 minutes a day.   Adherence Review: Does the patient have >5 day gap between last estimated fill dates? No        Goals Addressed            This Visit's Progress   . Pharmacy Care Plan   On track    CARE PLAN ENTRY (see longitudinal plan of care for additional care plan information)  Current Barriers:  . Chronic Disease Management support, education, and care coordination needs related to Hypertension, Hyperlipidemia, and Diabetes   Hypertension BP Readings from Last 3 Encounters:  10/23/19 128/72  06/27/19 (!) 154/74  06/20/19 126/78   . Pharmacist Clinical Goal(s): o Over the next 90 days, patient will work with PharmD and providers to maintain BP goal <130/80 . Current regimen:  o Telmisartan-HCTZ 40-12.5 mg daily . Interventions: o Recommend patient check blood pressure periodically and if symptomatic o Provided dietary and exercise recommendations - PLATE method discussed . Patient self care activities - Over the next 90 days, patient will: o Check BP periodically and if symptomatic, document, and provide at future appointments o Ensure daily salt intake < 2300 mg/day o Exercise at least 30 minutes daily, 5 times per week o Try PLATE method for meal planning  Hyperlipidemia Lab Results  Component Value Date/Time   LDLCALC 110 (H) 10/23/2019 03:31 PM   . Pharmacist Clinical Goal(s): o Over the next 180 days, patient will work with PharmD and providers to maintain LDL goal < 70 . Current regimen:  o  Livalo 4mg  daily . Interventions: o Provided dietary and exercise recommendations . Patient self care activities - Over the next 180 days, patient  will: o Take cholesterol medication daily o Exercise at least 30 minutes daily, 5 times per week  Diabetes Lab Results  Component Value Date/Time   HGBA1C 7.4 (H) 10/23/2019 03:34 PM   HGBA1C 7.2 (H) 06/20/2019 05:32 PM   . Pharmacist Clinical Goal(s): o Over the next 90 days, patient will work with PharmD and providers to achieve A1c goal <7% . Current regimen:   Xultophy 100-3.6 unit-mg/mL 25 units into skin daily  Metformin 500 mg daily with dinner . Interventions: o Provided dietary and exercise recommendations o Discussed appropriate goals for fasting blood sugar (80-130) and 2 hours after eating (less than 180) . Patient self care activities - Over the next 90 days, patient will: o Check blood sugar twice daily and at bedtime, document, and provide at future appointments o Contact provider with any episodes of hypoglycemia o Exercise at least 30 minutes daily, 5 times per week  Allergies . Pharmacist Clinical Goal(s): o Over the next 90 days, patient will work with PharmD and providers to control allergy symptoms . Current regimen:  . Fluticasone nasal spray 2 sprays in both nostrils daily . Loratadine 10 mg daily in the morning . Interventions: o Determined patient has continued Claritin daily (stopped Zyrtec) and states that her sinuses are good . Patient self care activities - Over the next 90 days, patient will: o Continue loratadine daily o Monitor allergy symptoms and be aware of blurry vision and dizziness  Medication management . Pharmacist Clinical Goal(s): o Over the next 90 days, patient will work with PharmD and providers to maintain optimal medication adherence . Current pharmacy: [facility] . Interventions o Comprehensive medication review performed. o Continue current medication management strategy o Determined patient is using Focus Factor memory supplement. Has not noticed significant difference yet but will give it a few months.  . Patient self care  activities - Over the next 90 days, patient will: o Focus on medication adherence by continued use of pill box o Take medications as prescribed o Report any questions or concerns to PharmD and/or provider(s)  Please see past updates related to this goal by clicking on the "Past Updates" button in the selected goal         Follow-Up:  Pharmacist Review    Christian Davis,CPP. Notified  Judithann Sheen, [place] Pharmacist Assistant [phone]

## 2020-03-02 ENCOUNTER — Other Ambulatory Visit: Payer: Self-pay | Admitting: Internal Medicine

## 2020-03-02 DIAGNOSIS — R490 Dysphonia: Secondary | ICD-10-CM

## 2020-03-14 ENCOUNTER — Other Ambulatory Visit: Payer: Self-pay | Admitting: Internal Medicine

## 2020-03-17 DIAGNOSIS — D123 Benign neoplasm of transverse colon: Secondary | ICD-10-CM | POA: Diagnosis not present

## 2020-03-17 DIAGNOSIS — K635 Polyp of colon: Secondary | ICD-10-CM | POA: Diagnosis not present

## 2020-03-17 DIAGNOSIS — K5140 Inflammatory polyps of colon without complications: Secondary | ICD-10-CM | POA: Diagnosis not present

## 2020-03-17 DIAGNOSIS — Z86010 Personal history of colonic polyps: Secondary | ICD-10-CM | POA: Diagnosis not present

## 2020-03-17 DIAGNOSIS — Z800 Family history of malignant neoplasm of digestive organs: Secondary | ICD-10-CM | POA: Diagnosis not present

## 2020-03-17 DIAGNOSIS — Z1211 Encounter for screening for malignant neoplasm of colon: Secondary | ICD-10-CM | POA: Diagnosis not present

## 2020-03-17 LAB — HM COLONOSCOPY

## 2020-03-22 ENCOUNTER — Ambulatory Visit: Payer: Medicare Other | Attending: Internal Medicine

## 2020-03-22 DIAGNOSIS — Z23 Encounter for immunization: Secondary | ICD-10-CM

## 2020-03-22 NOTE — Progress Notes (Signed)
   Covid-19 Vaccination Clinic  Name:  Agustina Vilikia Watkins Larson    MRN: [id number] DOB: [birth year]  03/22/2020  April Larson was observed post Covid-19 immunization for 15 minutes without incident. She was provided with Vaccine Information Sheet and instruction to access the V-Safe system.   [name] was instructed to call 911 with any severe reactions post vaccine: Marland Kitchen Difficulty breathing  . Swelling of face and throat  . A fast heartbeat  . A bad rash all over body  . Dizziness and weakness   Immunizations Administered    Name Date Dose VIS Date Route   Pfizer COVID-19 Vaccine 03/22/2020  9:35 AM 0.3 mL 01/23/2020 Intramuscular   Manufacturer: [id number]   Lot: [id number]   [place]: [id number]

## 2020-03-26 ENCOUNTER — Telehealth: Payer: Self-pay

## 2020-03-27 ENCOUNTER — Other Ambulatory Visit: Payer: Self-pay

## 2020-03-27 ENCOUNTER — Encounter (HOSPITAL_COMMUNITY): Payer: Self-pay

## 2020-03-27 ENCOUNTER — Emergency Department (HOSPITAL_COMMUNITY): Payer: Medicare Other

## 2020-03-27 ENCOUNTER — Emergency Department (HOSPITAL_COMMUNITY)
Admission: EM | Admit: 2020-03-27 | Discharge: 2020-03-27 | Disposition: A | Discharge status: Home / Self Care | Payer: Medicare Other | Attending: Emergency Medicine | Admitting: Emergency Medicine

## 2020-03-27 ENCOUNTER — Telehealth: Payer: Self-pay

## 2020-03-27 DIAGNOSIS — S0081XA Abrasion of other part of head, initial encounter: Secondary | ICD-10-CM | POA: Insufficient documentation

## 2020-03-27 DIAGNOSIS — E119 Type 2 diabetes mellitus without complications: Secondary | ICD-10-CM | POA: Insufficient documentation

## 2020-03-27 DIAGNOSIS — R55 Syncope and collapse: Secondary | ICD-10-CM | POA: Insufficient documentation

## 2020-03-27 DIAGNOSIS — Z794 Long term (current) use of insulin: Secondary | ICD-10-CM | POA: Insufficient documentation

## 2020-03-27 DIAGNOSIS — Z7982 Long term (current) use of aspirin: Secondary | ICD-10-CM | POA: Insufficient documentation

## 2020-03-27 DIAGNOSIS — Z7984 Long term (current) use of oral hypoglycemic drugs: Secondary | ICD-10-CM | POA: Diagnosis not present

## 2020-03-27 DIAGNOSIS — Y990 Civilian activity done for income or pay: Secondary | ICD-10-CM | POA: Insufficient documentation

## 2020-03-27 DIAGNOSIS — R0602 Shortness of breath: Secondary | ICD-10-CM | POA: Diagnosis not present

## 2020-03-27 DIAGNOSIS — Z87891 Personal history of nicotine dependence: Secondary | ICD-10-CM | POA: Diagnosis not present

## 2020-03-27 DIAGNOSIS — G4489 Other headache syndrome: Secondary | ICD-10-CM | POA: Diagnosis not present

## 2020-03-27 DIAGNOSIS — E1165 Type 2 diabetes mellitus with hyperglycemia: Secondary | ICD-10-CM | POA: Diagnosis not present

## 2020-03-27 DIAGNOSIS — I6523 Occlusion and stenosis of bilateral carotid arteries: Secondary | ICD-10-CM | POA: Diagnosis not present

## 2020-03-27 DIAGNOSIS — W01198A Fall on same level from slipping, tripping and stumbling with subsequent striking against other object, initial encounter: Secondary | ICD-10-CM | POA: Insufficient documentation

## 2020-03-27 DIAGNOSIS — I10 Essential (primary) hypertension: Secondary | ICD-10-CM | POA: Insufficient documentation

## 2020-03-27 DIAGNOSIS — S0990XA Unspecified injury of head, initial encounter: Secondary | ICD-10-CM | POA: Diagnosis not present

## 2020-03-27 DIAGNOSIS — Z79899 Other long term (current) drug therapy: Secondary | ICD-10-CM | POA: Insufficient documentation

## 2020-03-27 DIAGNOSIS — G319 Degenerative disease of nervous system, unspecified: Secondary | ICD-10-CM | POA: Diagnosis not present

## 2020-03-27 LAB — CBC WITH DIFFERENTIAL/PLATELET
Abs Immature Granulocytes: 0.03 10*3/uL (ref 0.00–0.07)
Basophils Absolute: 0.1 10*3/uL (ref 0.0–0.1)
Basophils Relative: 1 %
Eosinophils Absolute: 0.3 10*3/uL (ref 0.0–0.5)
Eosinophils Relative: 4 %
HCT: 37 % (ref 36.0–46.0)
Hemoglobin: 11.7 g/dL — ABNORMAL LOW (ref 12.0–15.0)
Immature Granulocytes: 0 %
Lymphocytes Relative: 23 %
Lymphs Abs: 1.6 10*3/uL (ref 0.7–4.0)
MCH: 27.3 pg (ref 26.0–34.0)
MCHC: 31.6 g/dL (ref 30.0–36.0)
MCV: 86.4 fL (ref 80.0–100.0)
Monocytes Absolute: 0.7 10*3/uL (ref 0.1–1.0)
Monocytes Relative: 9 %
Neutro Abs: 4.5 10*3/uL (ref 1.7–7.7)
Neutrophils Relative %: 63 %
Platelets: 324 10*3/uL (ref 150–400)
RBC: 4.28 MIL/uL (ref 3.87–5.11)
RDW: 15 % (ref 11.5–15.5)
WBC: 7.2 10*3/uL (ref 4.0–10.5)
nRBC: 0 % (ref 0.0–0.2)

## 2020-03-27 LAB — BASIC METABOLIC PANEL
Anion gap: 10 (ref 5–15)
BUN: 28 mg/dL — ABNORMAL HIGH (ref 8–23)
CO2: 23 mmol/L (ref 22–32)
Calcium: 9 mg/dL (ref 8.9–10.3)
Chloride: 104 mmol/L (ref 98–111)
Creatinine, Ser: 1.03 mg/dL — ABNORMAL HIGH (ref 0.44–1.00)
GFR, Estimated: 59 mL/min — ABNORMAL LOW (ref >60)
Glucose, Bld: 175 mg/dL — ABNORMAL HIGH (ref 70–99)
Potassium: 3.6 mmol/L (ref 3.5–5.1)
Sodium: 137 mmol/L (ref 135–145)

## 2020-03-27 LAB — URINALYSIS, ROUTINE W REFLEX MICROSCOPIC
Bilirubin Urine: NEGATIVE
Glucose, UA: NEGATIVE mg/dL
Hgb urine dipstick: NEGATIVE
Ketones, ur: NEGATIVE mg/dL
Nitrite: NEGATIVE
Protein, ur: NEGATIVE mg/dL
Specific Gravity, Urine: 1.021 (ref 1.005–1.030)
pH: 5 (ref 5.0–8.0)

## 2020-03-27 LAB — TROPONIN I (HIGH SENSITIVITY)
Troponin I (High Sensitivity): 12 ng/L (ref <18)
Troponin I (High Sensitivity): 14 ng/L (ref <18)

## 2020-03-27 LAB — CBG MONITORING, ED: Glucose-Capillary: 152 mg/dL — ABNORMAL HIGH (ref 70–99)

## 2020-03-27 LAB — MAGNESIUM: Magnesium: 2.1 mg/dL (ref 1.7–2.4)

## 2020-03-27 MED ORDER — ACETAMINOPHEN 500 MG PO TABS
1000.0000 mg | ORAL_TABLET | Freq: Once | ORAL | Status: AC
  Administered 2020-03-27: 1000 mg via ORAL
  Filled 2020-03-27: qty 2

## 2020-03-27 NOTE — ED Provider Notes (Signed)
TIME SEEN: 2:10 AM  CHIEF COMPLAINT: Syncope  HPI: Patient is a 69 year old female with history of hypertension, diabetes who presents to the emergency department after a syncopal event.  States she was working at [employer] when she suddenly started feeling poorly.  She cannot describe this any further.  She states she then passed out.  No preceding chest pain, shortness of breath, headache, numbness, tingling or weakness.  No recent fevers, cough, bloody stools, melena, vomiting or diarrhea.  Has an abrasion to her left forehead.  Denies any neck or back pain at this time.  ROS: See HPI Constitutional: no fever  Eyes: no drainage  ENT: no runny nose   Cardiovascular:  no chest pain  Resp: no SOB  GI: no vomiting GU: no dysuria Integumentary: no rash  Allergy: no hives  Musculoskeletal: no leg swelling  Neurological: no slurred speech ROS otherwise negative  PAST MEDICAL HISTORY/PAST SURGICAL HISTORY:  Past Medical History:  Diagnosis Date  . Diabetes mellitus without complication ([place])   . Hypertension   . Substance abuse ([place])     MEDICATIONS:  Prior to Admission medications   Medication Sig Start Date End Date Taking? Authorizing Provider  ACCU-CHEK FASTCLIX LANCETS MISC USE TO CHECK BLOOD SUGARS TWICE DAILY 02/14/18   Glendale Chard, MD  ACCU-CHEK GUIDE test strip USE TO CHECK BLOOD SUGARS TWICE DAILY AS DIRECTED 02/14/18   Glendale Chard, MD  APPLE CIDER VINEGAR PO Take by mouth. 2 gummies at night Patient not taking: Reported on 01/23/2020    [provider]  Ascorbic Acid (VITAMIN C PO) Take 1 tablet by mouth daily.    [provider]  aspirin EC 81 MG tablet Take 81 mg by mouth daily.    [provider]  Biotin 10 MG CAPS Take 1 tablet by mouth daily.    [provider]  calcium carbonate (OSCAL) 1500 (600 Ca) MG TABS tablet Take 600 mg of elemental calcium by mouth daily with breakfast.    [provider]  cetirizine  (ZYRTEC) 10 MG tablet Take 10 mg by mouth at bedtime.     [provider]  Cholecalciferol 125 MCG (5000 UT) TABS Take 5,000 Units by mouth daily.     [provider]  fluticasone (FLONASE) 50 MCG/ACT nasal spray SHAKE LIQUID AND USE 2 SPRAYS IN EACH NOSTRIL DAILY 03/03/20   Glendale Chard, MD  Insulin Pen Needle (B-D ULTRAFINE III SHORT PEN) 31G X 8 MM MISC USE AS DIRECTED TWICE DAILY 02/20/19   Glendale Chard, MD  LIVALO 4 MG TABS TAKE 1 TABLET BY MOUTH DAILY 01/09/20   Glendale Chard, MD  loratadine (CLARITIN) 10 MG tablet Take 10 mg by mouth daily. AM    [provider]  metFORMIN (GLUCOPHAGE) 500 MG tablet Take 1 tablet (500 mg total) by mouth daily with breakfast. Dinner 03/14/20   Glendale Chard, MD  Multiple Vitamins-Minerals (ONE-A-DAY WOMENS 50+ ADVANTAGE PO) Take 1 tablet by mouth daily.    [provider]  NON FORMULARY 2 each. Calm    [provider]  NON FORMULARY Focus factor    [provider]  omeprazole (PRILOSEC) 40 MG capsule TAKE 1 CAPSULE BY MOUTH EVERY DAY BEFORE A MEAL 01/09/20   Glendale Chard, MD  telmisartan-hydrochlorothiazide (MICARDIS HCT) 40-12.5 MG tablet TAKE 1 TABLET BY MOUTH DAILY 01/07/20   Glendale Chard, MD  Turmeric 500 MG CAPS Take 1 capsule by mouth daily.     [provider]  April Larson  100-3.6 UNIT-MG/ML SOPN INJECT 25 UNITS INTO THE SKIN DAILY 01/09/20   Glendale Chard, MD    ALLERGIES:  No Known Allergies  SOCIAL HISTORY:  Social History   Tobacco Use  . Smoking status: Former Smoker    Packs/day: 1.00    Years: 30.00    Pack years: 30.00    Types: Cigarettes    Quit date: 2004    Years since quitting: 17.9  . Smokeless tobacco: Never Used  Substance Use Topics  . Alcohol use: No    Alcohol/week: 0.0 standard drinks    FAMILY HISTORY: Family History  Problem Relation Age of Onset  . Cancer Mother   . Diabetes Father   . Cancer Brother   . Breast cancer Maternal Aunt      EXAM: BP (!) 165/69   Pulse 98   Temp 98.7 F (37.1 C)   Resp 16   Ht 5\' 4"  (1.626 m)   Wt 79.8 kg   SpO2 100%   BMI 30.21 kg/m  CONSTITUTIONAL: Alert and oriented and responds appropriately to questions. Well-appearing; well-nourished; GCS 15 HEAD: Normocephalic; abrasion to the left forehead EYES: Conjunctivae clear, PERRL, EOMI ENT: normal nose; no rhinorrhea; moist mucous membranes; pharynx without lesions noted; no dental injury; no septal hematoma NECK: Supple, no meningismus, no LAD; no midline spinal tenderness, step-off or deformity; trachea midline CARD: RRR; S1 and S2 appreciated; no murmurs, no clicks, no rubs, no gallops RESP: Normal chest excursion without splinting or tachypnea; breath sounds clear and equal bilaterally; no wheezes, no rhonchi, no rales; no hypoxia or respiratory distress CHEST:  chest wall stable, no crepitus or ecchymosis or deformity, nontender to palpation; no flail chest ABD/GI: Normal bowel sounds; non-distended; soft, non-tender, no rebound, no guarding; no ecchymosis or other lesions noted PELVIS:  stable, nontender to palpation BACK:  The back appears normal and is non-tender to palpation, there is no CVA tenderness; no midline spinal tenderness, step-off or deformity EXT: Normal ROM in all joints; non-tender to palpation; no edema; normal capillary refill; no cyanosis, no bony tenderness or bony deformity of patient's extremities, no joint effusion, compartments are soft, extremities are warm and well-perfused, no ecchymosis SKIN: Normal color for age and race; warm NEURO: Moves all extremities equally, normal sensation diffusely, cranial nerves II to XII intact, normal speech PSYCH: The patient's mood and manner are appropriate. Grooming and personal hygiene are appropriate.  MEDICAL DECISION MAKING: Patient here after syncopal event.  Differential includes anemia, electrolyte derangement, ACS, arrhythmia, dehydration, hypoglycemia.  EKG  shows no ischemia, arrhythmia or interval abnormality at this time.  No delta wave.    We will continue to monitor closely.  Will obtain labs, CT imaging of her head given her head injury and age.  She is not on blood thinners.  Neurologically intact at this time.  ED PROGRESS: Patient's work-up here has been unrevealing.  Normal hemoglobin.  Normal electrolytes.  She has had 2 normal high-sensitivity troponins.  Blood glucose normal.  Able to tolerate p.o. here.  Orthostatic vital signs unremarkable. She has been monitored for several hours in no event seen on cardiac monitoring.  CT head shows no acute abnormality.   Recommended close follow-up with her primary care physician but I feel she is safe for discharge.   Patient and family at bedside comfortable with this plan.   EKG Interpretation  Date/Time:  Thursday March 27 2020 01:00:20 EST Ventricular Rate:  99 PR Interval:    QRS Duration: 87 QT Interval:  357 QTC Calculation: 459 R Axis:   74 Text Interpretation: Sinus rhythm Borderline T abnormalities, anterior leads 12 Lead; Mason-Likar No significant change since last tracing Reconfirmed by Pryor Curia [phone]) on 03/27/2020 2:11:29 AM          April Larson was evaluated in Emergency Department on 03/27/2020 for the symptoms described in the history of present illness. She was evaluated in the context of the global COVID-19 pandemic, which necessitated consideration that the patient might be at risk for infection with the SARS-CoV-2 virus that causes COVID-19. Institutional protocols and algorithms that pertain to the evaluation of patients at risk for COVID-19 are in a state of rapid change based on information released by regulatory bodies including the CDC and federal and state organizations. These policies and algorithms were followed during the patient's care in the ED.       Teddy Rebstock, Delice Bison, DO 03/27/20 [phone]

## 2020-03-27 NOTE — Telephone Encounter (Signed)
The pt was called and asked how she is doing because she passed out at work. The pt said that she feels ok, she did go to the ER, the did all kinds of test and didn't find anything wrong.  The pt was scheduled a f/u appt for evaluation.

## 2020-03-27 NOTE — Discharge Instructions (Addendum)
You may alternate Tylenol 1000 mg every 6 hours as needed for pain, fever and Ibuprofen 800 mg every 8 hours as needed for pain, fever.  Please take Ibuprofen with food.  Do not take more than 4000 mg of Tylenol (acetaminophen) in a 24 hour period.  Your labs, EKG, CT head was very reassuring today.  I recommend increased water intake over the next several days and increased rest.  Please follow-up with your primary care physician next week.

## 2020-03-27 NOTE — ED Triage Notes (Signed)
Pt coming from work and had a syncopal episode falling and hitting head on ground. EMS reports LOC for approx 1 minute. Hematoma on left side of forehead approximately diameter of golf ball.

## 2020-04-02 ENCOUNTER — Telehealth: Payer: Self-pay

## 2020-04-02 NOTE — Progress Notes (Signed)
04/02/20-Called and spoke to the patient to remind her of upcoming follow-up appointment 04/09/19 Tuesday at 1:00 pm with Cherylin Mylar, CPP. Informed the patient be sure to have all medications and supplements during phone call. The patient verbalized understanding.   Notified Cherylin Mylar, CPP.   Suezanne Cheshire, [facility] Clinical Pharmacist Assistant [phone]

## 2020-04-03 ENCOUNTER — Encounter: Payer: Self-pay | Admitting: Nurse Practitioner

## 2020-04-03 ENCOUNTER — Ambulatory Visit (INDEPENDENT_AMBULATORY_CARE_PROVIDER_SITE_OTHER): Payer: Medicare Other | Admitting: Nurse Practitioner

## 2020-04-03 ENCOUNTER — Other Ambulatory Visit: Payer: Self-pay | Admitting: Nurse Practitioner

## 2020-04-03 ENCOUNTER — Ambulatory Visit
Admission: RE | Admit: 2020-04-03 | Discharge: 2020-04-03 | Disposition: A | Discharge status: Home / Self Care | Payer: Medicare Other | Source: Ambulatory Visit | Attending: Nurse Practitioner | Admitting: Nurse Practitioner

## 2020-04-03 ENCOUNTER — Telehealth: Payer: Self-pay | Admitting: Nurse Practitioner

## 2020-04-03 ENCOUNTER — Other Ambulatory Visit: Payer: Self-pay

## 2020-04-03 VITALS — BP 124/82 | HR 82 | Temp 97.8°F | Ht 64.0 in | Wt 175.2 lb

## 2020-04-03 DIAGNOSIS — W19XXXD Unspecified fall, subsequent encounter: Secondary | ICD-10-CM

## 2020-04-03 DIAGNOSIS — M25552 Pain in left hip: Secondary | ICD-10-CM

## 2020-04-03 DIAGNOSIS — R0781 Pleurodynia: Secondary | ICD-10-CM

## 2020-04-03 DIAGNOSIS — N182 Chronic kidney disease, stage 2 (mild): Secondary | ICD-10-CM

## 2020-04-03 DIAGNOSIS — E1122 Type 2 diabetes mellitus with diabetic chronic kidney disease: Secondary | ICD-10-CM

## 2020-04-03 MED ORDER — DICLOFENAC SODIUM 1 % EX GEL: 2.0000 g | Freq: Four times a day (QID) | CUTANEOUS | 2 refills | Status: DC

## 2020-04-03 NOTE — Telephone Encounter (Signed)
Called to make patient aware she has a rib fracture to her left rib, this may be what is causing her pain.  She is to also pick up Salonpas patches to help. She is hesitant to take anything narcotic due to her history of substance abuse. She is aware if she has shortness of breath to seek emergency attention.

## 2020-04-03 NOTE — Progress Notes (Signed)
I,Yamilka Roman [employer] as a Education administrator for [employer], FNP.,have documented all relevant documentation on the behalf of Minette Brine, FNP,as directed by  Minette Brine, FNP while in the presence of Minette Brine, [place]. This visit occurred during the SARS-CoV-2 public health emergency.  Safety protocols were in place, including screening questions prior to the visit, additional usage of staff PPE, and extensive cleaning of exam room while observing appropriate contact time as indicated for disinfecting solutions.  Subjective:     Patient ID: April Larson , female    DOB: April 02, 1951 , 69 y.o.   MRN: [id number]   Chief Complaint  Patient presents with  . Fall    Patient stated she passed out last Wednesday night she stated she feels real sore and was told to take tylenol and she stated she hit her head    HPI  Patient here for a ER f/u she stated she passed out last Wednesday and hit her head. She reports having headaches since then.  She had been feeling sleepy and was given coffee and cookies. When she went back to her area the next thing she knew she was on floor, was able to answer all questions.  She hit her left forehead, with an abrasion the scab came off this morning. She has been feeling sore.  Blood sugars have been 86 -134.  She had a headache after she fell. She continues to have pain to her left side underneath her breast. She has taken Tylenol.    Fall She fell from a height of 3 to 5 ft. She landed on hard floor. The point of impact was the head (left side of ribs and hip). Pain location: left rib and hip. She has tried acetaminophen for the symptoms. The treatment provided mild relief.     Past Medical History:  Diagnosis Date  . Diabetes mellitus without complication ([place])   . Hypertension   . Substance abuse ([place])      Family History  Problem Relation Age of Onset  . Cancer Mother   . Diabetes Father   . Cancer Brother   . Breast cancer Maternal  Aunt      Current Outpatient Medications:  .  ACCU-CHEK FASTCLIX LANCETS MISC, USE TO CHECK BLOOD SUGARS TWICE DAILY, Disp: 100 each, Rfl: 11 .  Ascorbic Acid (VITAMIN C PO), Take 1 tablet by mouth daily., Disp: , Rfl:  .  aspirin EC 81 MG tablet, Take 81 mg by mouth daily., Disp: , Rfl:  .  Biotin 10 MG CAPS, Take 1 tablet by mouth daily., Disp: , Rfl:  .  calcium carbonate (OSCAL) 1500 (600 Ca) MG TABS tablet, Take 600 mg of elemental calcium by mouth daily with breakfast., Disp: , Rfl:  .  Cholecalciferol 125 MCG (5000 UT) TABS, Take 5,000 Units by mouth daily. , Disp: , Rfl:  .  diclofenac Sodium (VOLTAREN) 1 % GEL, Apply 2 g topically 4 (four) times daily., Disp: 100 g, Rfl: 2 .  fluticasone (FLONASE) 50 MCG/ACT nasal spray, SHAKE LIQUID AND USE 2 SPRAYS IN EACH NOSTRIL DAILY, Disp: 16 g, Rfl: 2 .  Insulin Pen Needle (B-D ULTRAFINE III SHORT PEN) 31G X 8 MM MISC, USE AS DIRECTED TWICE DAILY, Disp: 100 each, Rfl: 11 .  LIVALO 4 MG TABS, TAKE 1 TABLET BY MOUTH DAILY, Disp: 90 tablet, Rfl: 1 .  loratadine (CLARITIN) 10 MG tablet, Take 10 mg by mouth daily. AM, Disp: , Rfl:  .  metFORMIN (GLUCOPHAGE)  500 MG tablet, Take 1 tablet (500 mg total) by mouth daily with breakfast. Dinner, Disp: 180 tablet, Rfl: 3 .  Multiple Vitamins-Minerals (ONE-A-DAY WOMENS 50+ ADVANTAGE PO), Take 1 tablet by mouth daily., Disp: , Rfl:  .  NON FORMULARY, 2 each. Calm, Disp: , Rfl:  .  omeprazole (PRILOSEC) 40 MG capsule, TAKE 1 CAPSULE BY MOUTH EVERY DAY BEFORE A MEAL, Disp: 90 capsule, Rfl: 1 .  telmisartan-hydrochlorothiazide (MICARDIS HCT) 40-12.5 MG tablet, TAKE 1 TABLET BY MOUTH DAILY, Disp: 90 tablet, Rfl: 1 .  Turmeric 500 MG CAPS, Take 1 capsule by mouth daily. , Disp: , Rfl:  .  XULTOPHY 100-3.6 UNIT-MG/ML SOPN, INJECT 25 UNITS INTO THE SKIN DAILY, Disp: 27 mL, Rfl: 3 .  ACCU-CHEK GUIDE test strip, USE TO CHECK BLOOD SUGARS TWICE DAILY AS DIRECTED, Disp: 100 strip, Rfl: 2 .  Blood Glucose Monitoring  Suppl (ACCU-CHEK GUIDE) w/Device KIT, USE TO CHECK BLOOD SUGARS TWICE DAILY AS DIRECTED dx: e11.22, Disp: 1 kit, Rfl: 1   No Known Allergies   Review of Systems  Constitutional: Negative.   HENT: Negative.   Eyes: Negative.   Respiratory: Negative.   Cardiovascular: Negative.  Negative for chest pain.  Gastrointestinal: Negative.   Genitourinary: Negative.   Musculoskeletal: Negative.        Left hip and rib pain  Skin: Negative.   Hematological: Negative.   Psychiatric/Behavioral: Negative.      Today's Vitals   04/03/20 0945  BP: 124/82  Pulse: 82  Temp: 97.8 F (36.6 C)  TempSrc: Oral  Weight: 175 lb 3.2 oz (79.5 kg)  Height: $Remove'5\' 4"'bTONqHw$  (1.626 m)  PainSc: 7   PainLoc: Abdomen   Body mass index is 30.07 kg/m.   Objective:  Physical Exam Constitutional:      General: She is not in acute distress.    Appearance: Normal appearance. She is obese.  Cardiovascular:     Rate and Rhythm: Normal rate and regular rhythm.     Pulses: Normal pulses.     Heart sounds: Normal heart sounds. No murmur heard.   Pulmonary:     Effort: Pulmonary effort is normal. No respiratory distress.     Breath sounds: Normal breath sounds. No wheezing.  Musculoskeletal:        General: Tenderness (right chest area is tender to touch) present.  Skin:    General: Skin is warm.     Comments: She has a healing abrasion to left side of forehead  Neurological:     General: No focal deficit present.     Mental Status: She is alert and oriented to person, place, and time.     Cranial Nerves: No cranial nerve deficit.  Psychiatric:        Mood and Affect: Mood normal.        Behavior: Behavior normal.        Thought Content: Thought content normal.        Judgment: Judgment normal.         Assessment And Plan:     1. Left hip pain  She is to have a hip xray to make sure she does not have a fracture  2. Rib pain  Will check rib xray to see for fracture   Advised if has fracture to  monitor for shortness of breath  3. Fall, subsequent encounter  Fall on 12/23 after suffering a syncopal episode  She is having continued rib pain and left hip pain.  - diclofenac Sodium (  VOLTAREN) 1 % GEL; Apply 2 g topically 4 (four) times daily.  Dispense: 100 g; Refill: 2  4. Diabetes mellitus with stage 2 chronic kidney disease ([place])      Patient was given opportunity to ask questions. Patient verbalized understanding of the plan and was able to repeat key elements of the plan. All questions were answered to their satisfaction.  Minette Brine, FNP   I, Minette Brine, FNP, have reviewed all documentation for this visit. The documentation on 04/15/20 for the exam, diagnosis, procedures, and orders are all accurate and complete.  THE PATIENT IS ENCOURAGED TO PRACTICE SOCIAL DISTANCING DUE TO THE COVID-19 PANDEMIC.

## 2020-04-03 NOTE — Patient Instructions (Signed)
You may use diclofenac pain cream

## 2020-04-08 ENCOUNTER — Ambulatory Visit: Payer: Self-pay

## 2020-04-08 DIAGNOSIS — E1122 Type 2 diabetes mellitus with diabetic chronic kidney disease: Secondary | ICD-10-CM

## 2020-04-08 DIAGNOSIS — E7800 Pure hypercholesterolemia, unspecified: Secondary | ICD-10-CM

## 2020-04-08 NOTE — Chronic Care Management (AMB) (Signed)
Chronic Care Management Pharmacy  Name: April Larson  MRN: [id number] DOB: 06-11-1950  Chief Complaint/ HPI  April Larson,  70 y.o. , female presents for their Follow-Up CCM visit with the clinical pharmacist via telephone due to COVID-19 Pandemic. Patient spouse died in 06/11/17, she lives alone and has a 13 year old dog. She has a yorkie named Teacher, English as a foreign language. 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 She retired three times but she does not like to stay at home. She is currently working at her church as well. Prior to the pandemic she was singing in the choir and doing different things with the church in person.  Her father was a Environmental education officer and he passed when she was a recovering alcoholic. She has been clean for over 29 years. She has a brother who is 25 and her oldest sister 40 years old. [name] is taken care of by her brother and sister when she is not home.   PCP : Glendale Chard, MD  Their chronic conditions include: HTN with CKD II, Type 2 DM, Allergies, Right Hip Pain, Estrogen deficiency.   Office Visits:  04/03/20: Phone Call: patient has left rib fracture, patient would not like to take narcotics due to past history of drug abuse, [place] recommended by PCP team Minette Brine. Patient also prescribed 2 grams four times per day on fractured rib  03/27/20 Phone Call: patient passed out at work  Called by PCP team to find out how the patient was doing   10/23/19 OV: Annual physical exam. Diabetic foot exam performed. EKG performed, NSR without acute changes. HgbA1c up to 7.4%. Exercise 30 minutes 5 days per week. Liver and kidney function stable. Calcium slightly elevated, be sure to stay well hydrated. Vitamin D level is good, continue current supplementation. LDL 110.   08/27/2019 Video visit: Complaint of sore throat after nosebleed. Hoarseness began 1 day after nosebleed along with post nasal drainage. Pt taking Zyrtec at night and Claritin during the day.  Use Flonase. Update provider. If not resolved could consider GERD.   06/20/2019 AWV and OV: Presented for HTN/DM check and complaint of nosebleed. Labs ordered (HgbA1c, BMP8+EGFR, CBC no Diff). CT chest lung screening due to 20 year h/o tobacco use. Line nose with Vaseline to prevent nose bleed. May consider humidifier.   Consult Visits:  06/27/2019 Neurology OV w/ M. [place]: OSA on CPAP. Non compliance with CPAP due to nose bleed. Now resolved and starting CPAP therapy again. Mask refitting ordered. Encouraged patient to use CPAP nightly and >4 hours each night.   CCM Visits:    01/24/2020 RN: CCM visit   10/05/2019 SW: No follow up planned at this time. SW encouraged the patient to contact SW directly with future resource needs.  ED Visits:  03/27/2020 : Patient seen by ED due to passing out at work in the [employer]   Medications: Outpatient Encounter Medications as of 04/08/2020  Medication Sig   ACCU-CHEK FASTCLIX LANCETS MISC USE TO CHECK BLOOD SUGARS TWICE DAILY   ACCU-CHEK GUIDE test strip USE TO CHECK BLOOD SUGARS TWICE DAILY AS DIRECTED   APPLE CIDER VINEGAR PO Take by mouth. 2 gummies at night (Patient not taking: Reported on 04/03/2020)   Ascorbic Acid (VITAMIN C PO) Take 1 tablet by mouth daily.   aspirin EC 81 MG tablet Take 81 mg by mouth daily.   Biotin 10 MG CAPS Take 1 tablet by mouth daily.   calcium carbonate (OSCAL)  1500 (600 Ca) MG TABS tablet Take 600 mg of elemental calcium by mouth daily with breakfast.   Cholecalciferol 125 MCG (5000 UT) TABS Take 5,000 Units by mouth daily.    diclofenac Sodium (VOLTAREN) 1 % GEL Apply 2 g topically 4 (four) times daily.   fluticasone (FLONASE) 50 MCG/ACT nasal spray SHAKE LIQUID AND USE 2 SPRAYS IN EACH NOSTRIL DAILY   Insulin Pen Needle (B-D ULTRAFINE III SHORT PEN) 31G X 8 MM MISC USE AS DIRECTED TWICE DAILY   LIVALO 4 MG TABS TAKE 1 TABLET BY MOUTH DAILY   loratadine (CLARITIN) 10 MG tablet Take 10 mg by mouth  daily. AM   metFORMIN (GLUCOPHAGE) 500 MG tablet Take 1 tablet (500 mg total) by mouth daily with breakfast. Dinner   Multiple Vitamins-Minerals (ONE-A-DAY WOMENS 50+ ADVANTAGE PO) Take 1 tablet by mouth daily.   NON FORMULARY 2 each. Calm   omeprazole (PRILOSEC) 40 MG capsule TAKE 1 CAPSULE BY MOUTH EVERY DAY BEFORE A MEAL   telmisartan-hydrochlorothiazide (MICARDIS HCT) 40-12.5 MG tablet TAKE 1 TABLET BY MOUTH DAILY   Turmeric 500 MG CAPS Take 1 capsule by mouth daily.    XULTOPHY 100-3.6 UNIT-MG/ML SOPN INJECT 25 UNITS INTO THE SKIN DAILY   No facility-administered encounter medications on file as of 04/08/2020.   Current Diagnosis/Assessment:   Goals Addressed            This Visit's Progress    Pharmacy Care Plan       CARE PLAN ENTRY (see longitudinal plan of care for additional care plan information)  Current Barriers:   Chronic Disease Management support, education, and care coordination needs related to Hypertension, Hyperlipidemia, and Diabetes   Hypertension BP Readings from Last 3 Encounters:  10/23/19 128/72  06/27/19 (!) 154/74  06/20/19 126/78    Pharmacist Clinical Goal(s): o Over the next 90 days, patient will work with PharmD and providers to maintain BP goal <130/80  Current regimen:  o Telmisartan-HCTZ 40-12.5 mg daily  Interventions: o Recommend patient check blood pressure periodically and if symptomatic o Provided dietary and exercise recommendations - PLATE method discussed  Patient self care activities - Over the next 90 days, patient will: o Check BP periodically and if symptomatic, document, and provide at future appointments o Ensure daily salt intake < 2300 mg/day o Exercise at least 30 minutes daily, 5 times per week o Try PLATE method for meal planning  Hyperlipidemia Lab Results  Component Value Date/Time   LDLCALC 110 (H) 10/23/2019 03:31 PM    Pharmacist Clinical Goal(s): o Over the next 180 days, patient will work  with PharmD and providers to maintain LDL goal < 70  Current regimen:  o Livalo 48m daily o Aspirin 81 mg daily   Interventions: o Provided dietary and exercise recommendations  Patient self care activities - Over the next 180 days, patient will: o Take cholesterol medication daily o Exercise at least 30 minutes daily, 5 times per week o Begin Silver SE[employer] Corporationprogram on yGeneral Dynamicschair exercises o Diet modifications   Diabetes Lab Results  Component Value Date/Time   HGBA1C 7.4 (H) 10/23/2019 03:34 PM   HGBA1C 7.2 (H) 06/20/2019 05:32 PM    Pharmacist Clinical Goal(s): o Over the next 90 days, patient will work with PharmD and providers to achieve A1c goal <7%  Current regimen:   Xultophy 100-3.6 unit-mg/mL 25 units into skin daily  Metformin 500 mg daily with dinner  Interventions: o Provided dietary and exercise recommendations o Discussed appropriate  goals for fasting blood sugar (80-130) and 2 hours after eating (less than 180)  Patient self care activities - Over the next 90 days, patient will: o Check blood sugar twice daily and at bedtime, document, and provide at future appointments o Contact provider with any episodes of hypoglycemia o Exercise at least 30 minutes daily, 5 times per week  Allergies  Pharmacist Clinical Goal(s): o Over the next 90 days, patient will work with PharmD and providers to control allergy symptoms  Current regimen:   Fluticasone nasal spray 2 sprays in both nostrils daily  Loratadine 10 mg daily in the morning  Interventions: o Determined patient has continued Claritin daily (stopped Zyrtec) and states that her sinuses are good  Patient self care activities - Over the next 90 days, patient will: o Continue loratadine daily o Monitor allergy symptoms and be aware of blurry vision and dizziness  Medication management  Pharmacist Clinical Goal(s): o Over the next 90 days, patient will work with PharmD and providers to maintain  optimal medication adherence  Current pharmacy: [facility]  Interventions o Comprehensive medication review performed. o Continue current medication management strategy o Determined patient is using Focus Factor memory supplement. Has not noticed significant difference yet but will give it a few months.   Patient self care activities - Over the next 90 days, patient will: o Focus on medication adherence by continued use of pill box o Take medications as prescribed o Report any questions or concerns to PharmD and/or provider(s)  Please see past updates related to this goal by clicking on the "Past Updates" button in the selected goal        Hyperlipidemia   LDL goal < 70  Lipid Panel     Component Value Date/Time   CHOL 186 10/23/2019 1531   TRIG 109 10/23/2019 1531   HDL 56 10/23/2019 1531   LDLCALC 110 (H) 10/23/2019 1531    Hepatic Function Latest Ref Rng & Units 10/23/2019 10/12/2018 05/15/2018  Total Protein 6.0 - 8.5 g/dL 7.9 7.1 7.5  Albumin 3.8 - 4.8 g/dL 4.5 4.4 4.4  AST 0 - 40 IU/L '12 17 12  ' ALT 0 - 32 IU/L '12 16 13  ' Alk Phosphatase 48 - 121 IU/L 133(H) 94 106  Total Bilirubin 0.0 - 1.2 mg/dL 0.3 <0.2 <0.2    The 10-year ASCVD risk score Mikey Bussing DC Jr., et al., 2013) is: 21.2%   Values used to calculate the score:     Age: 7 years     Sex: Female     Is Non-Hispanic African American: Yes     Diabetic: Yes     Tobacco smoker: No     Systolic Blood Pressure: 220 mmHg     Is BP treated: Yes     HDL Cholesterol: 56 mg/dL     Total Cholesterol: 186 mg/dL   Patient has failed these meds in past: tried other medications but she does not remember which ones, she reports that she had pain and muscle aches with the other cholesterol medications  Patient is currently uncontrolled on the following medications:  Livalo 4 mg daily o Will recommend patients Livalo decreased to 2 mg due to kidney function   Aspirin EC 81 mg daily  We discussed:   Diet and Exercise  Extensively:  Breakfast - eating maple cinnamon oatmeal (quaker instant oatmeal)   Snack- apple slices (5 or 6) and string cheese, a lot of pepperoni she usually eats a 20 ounce bag in a  week and a half  Goal: Patient to eat five pieces of pepperoni instead of 10 pieces during her snack with some pieces of broccoli   Lunch- Today: Left over spaghetti and 2 chicken wings  Dinner- Today: Homemade soup (chicken &vegetable) - 2 wings   The importance of reducing canned goods-including string beans and tomatoes   Plan Continue current medications  With the following renal adjustment: Collaborate with PCP to reduce Livalo from 4 mg to 2 mg eGFR 59 on 03/27/2020, she might also be a candidate for a trial of every other day dosing of atorvastatin Patient will need a lipid panel done to assess how she is doing when seen again by pcp   Diabetes   Goal A1c<7  Recent Relevant Labs: Lab Results  Component Value Date/Time   HGBA1C 7.5 (H) 01/23/2020 03:17 PM   HGBA1C 7.4 (H) 10/23/2019 03:34 PM   MICROALBUR 10 10/23/2019 03:42 PM   MICROALBUR 10 10/12/2018 04:36 PM    Checking BG: 2x per Day  Recent FBG Readings: 103-89  Recent pre-meal BG readings:  Recent 2hr PP BG readings:  137-138 Recent HS BG readings:  Patient has failed these meds in past: Levemir, Victoza, pioglitazone Patient is currently uncontrolled on the following medications:   Xultophy 100-3.6 unit-mg/mL 25 units into skin daily, patient takes at night  Metformin 500 mg daily with dinner  Last diabetic Foot exam: 10/23/19 Last diabetic Eye exam: Lab Results  Component Value Date/Time   HMDIABEYEEXA No Retinopathy 09/25/2019 12:00 AM   We discussed:   Goal FBG <130, 2hr PP <180, HgbA1c <7%  Diet extensively  Exercise extensively o Crunches, weights, bending/stretching, leg lifts in chair o Walks every day for at least 15 minutes  o Patient to start using silver sneakers three times per week to do exercises  at home   Plan Continue current medications   Hypertension  Goal <130/80   Office blood pressures are  BP Readings from Last 3 Encounters:  04/03/20 124/82  03/27/20 (!) 152/83  01/23/20 138/64   Patient has failed these meds in the past: Losartan-HCTZ, olmesartan-HCTZ  Patient is currently controlled on the following medications:   Telmisartan-HCTZ 40-12.5 mg daily at night  Patient checks BP at home infrequently  Patient home BP readings are ranging:Patient reports checking her BP every now and again but has not checked it recently.   We discussed:  Hardly ever has headaches  The importance of checking BP at least three times week   Pt to start checking BP at night before she goes to bed.  Diet extensively  Pt does not add any salt to food  Plan Continue current medications   Pain   Patient has failed these meds in past: Hydrocodone-APAP, meloxicam, ibuprofen Patient is currently controlled on the following medications:   Acetaminophen 1042m every 6 hours a needed  Diclofenac 1% Gel: Apply 2 grams topically 4 times per day  Plan Continue current medications. Patient reports that the Diclofenac Gel has helped with her pain.   Osteopenia / Osteoporosis   Last DEXA Scan: 01/2019  T-Score femoral neck: -1.3  T-Score lumbar spine: -0.9  10-year probability of major osteoporotic fracture: 3.8%  10-year probability of hip fracture: 0.4%  Vit D, 25-Hydroxy  Date Value Ref Range Status  10/23/2019 56.1 30.0 - 100.0 ng/mL Final    Comment:    Vitamin D deficiency has been defined by the Institute of Medicine and an Endocrine Society practice guideline as a level  of serum 25-OH vitamin D less than 20 ng/mL (1,2). The Endocrine Society went on to further define vitamin D insufficiency as a level between 21 and 29 ng/mL (2). 1. IOM (Institute of Medicine). 2010. Dietary reference    intakes for calcium and D. [place]: The    [facility]. 2. Holick MF, Binkley [place], Bischoff-Ferrari HA, et al.    Evaluation, treatment, and prevention of vitamin D    deficiency: an Endocrine Society clinical practice    guideline. JCEM. 2011 Jul; 96(7):1911-30.      Patient is not a candidate for pharmacologic treatment  Patient has failed these meds in past: N/A Patient is currently controlled on the following medications:   Vitamin D 5000 units daily   We discussed:  Recommend weight-bearing and muscle strengthening exercises for building and maintaining bone density.   Pt reports that she has kept doing weight bearing exercises every other day   Plan Continue current medications   Allergies   Patient has failed these meds in past: N/A Patient is currently controlled on the following medications:   Fluticasone nasal spray 2 sprays in both nostrils daily  Loratadine 10 mg daily in the morning  We discussed:  Sinuses are doing fine with just taking Claritin daily (stopped Zyrtec back in July)  Plan Continue current medications  Indigestion   Patient has failed these meds in past: N/A Patient is currently controlled on the following medications:   Omeprazole 40 mg daily   We discussed:  Pt started taking omeprazole 30-60 minutes before breakfast as recommended  Pt states her stomach cramps less  Plan Continue current medications  Over the counter medications   Patient is currently  on the following medications:   Turmeric 500 mg capsule daily   Vitamin C daily   Biotin 10 mg daily   Women's multivitamin daily   Focus factor (nutrition for the brain) daily  We discussed:    Pt has been taking Focus factor for about 1 month and has noticed minimal difference but it going to give it some more time  Plan Continue current medications   Chronic Kidney Disease Stage 2   Lab Results  Component Value Date   CREATININE 1.03 (H) 03/27/2020   BUN 28 (H) 03/27/2020   GFRNONAA 59 (L) 03/27/2020    GFRAA 57 (L) 01/23/2020   NA 137 03/27/2020   K 3.6 03/27/2020   CALCIUM 9.0 03/27/2020   CO2 23 03/27/2020    Patient has failed these meds in past:  Patient is currently controlled on the following medications:  Telmisartan-hydrochlorothiazide (MICARDIS HCT) 40-12.5 MG tablet    We will discuss this during the next visit.   Plan  Continue current medications. I reviewed patients current medications and recommend the renal adjustment of Livalo to 2 mg per day. All other medications are being closely monitored and have been reviewed for renal adjustment.   Vaccines   Reviewed and discussed patient's vaccination history.    Immunization History  Administered Date(s) Administered   Fluad Quad(high Dose 65+) 01/01/2019, 12/27/2019   Influenza, High Dose Seasonal PF 01/12/2018, 01/01/2019   PFIZER SARS-COV-2 Vaccination 04/26/2019, 05/18/2019, 03/22/2020   Pneumococcal Conjugate-13 10/13/2018   Pneumococcal Polysaccharide-23 03/02/2017   Tdap 09/18/2015   Zoster 11/04/2015   Zoster Recombinat (Shingrix) 11/22/2018, 02/25/2019    Plan No recommendations at this time  Medication Management   Pt uses [facility] for all medications Uses pill box? Yes one for day and one  for night Pt endorses 100% compliance  We discussed:   Importance of medication adherence daily  Plan Continue current medication management strategy  Discuss medication synchronization and adherence packaging at follow up  Follow up: 12 week phone visit  Orlando Penner, PharmD Clinical Pharmacist [facility] [phone]

## 2020-04-08 NOTE — Patient Instructions (Signed)
Visit Information  Goals Addressed            This Visit's Progress   . Pharmacy Care Plan       CARE PLAN ENTRY (see longitudinal plan of care for additional care plan information)  Current Barriers:  . Chronic Disease Management support, education, and care coordination needs related to Hypertension, Hyperlipidemia, and Diabetes   Hypertension BP Readings from Last 3 Encounters:  10/23/19 128/72  06/27/19 (!) 154/74  06/20/19 126/78   . Pharmacist Clinical Goal(s): o Over the next 90 days, patient will work with PharmD and providers to maintain BP goal <130/80 . Current regimen:  o Telmisartan-HCTZ 40-12.5 mg daily . Interventions: o Recommend patient check blood pressure periodically and if symptomatic o Provided dietary and exercise recommendations - PLATE method discussed . Patient self care activities - Over the next 90 days, patient will: o Check BP periodically and if symptomatic, document, and provide at future appointments o Ensure daily salt intake < 2300 mg/day o Exercise at least 30 minutes daily, 5 times per week o Try PLATE method for meal planning  Hyperlipidemia Lab Results  Component Value Date/Time   LDLCALC 110 (H) 10/23/2019 03:31 PM   . Pharmacist Clinical Goal(s): o Over the next 180 days, patient will work with PharmD and providers to maintain LDL goal < 70 . Current regimen:  o Livalo 4mg  daily o Aspirin 81 mg daily  . Interventions: o Provided dietary and exercise recommendations . Patient self care activities - Over the next 180 days, patient will: o Take cholesterol medication daily o Exercise at least 30 minutes daily, 5 times per week o Begin [employer] program on [employer] chair exercises o Diet modifications   Diabetes Lab Results  Component Value Date/Time   HGBA1C 7.4 (H) 10/23/2019 03:34 PM   HGBA1C 7.2 (H) 06/20/2019 05:32 PM   . Pharmacist Clinical Goal(s): o Over the next 90 days, patient will work with PharmD and  providers to achieve A1c goal <7% . Current regimen:   Xultophy 100-3.6 unit-mg/mL 25 units into skin daily  Metformin 500 mg daily with dinner . Interventions: o Provided dietary and exercise recommendations o Discussed appropriate goals for fasting blood sugar (80-130) and 2 hours after eating (less than 180) . Patient self care activities - Over the next 90 days, patient will: o Check blood sugar twice daily and at bedtime, document, and provide at future appointments o Contact provider with any episodes of hypoglycemia o Exercise at least 30 minutes daily, 5 times per week  Allergies . Pharmacist Clinical Goal(s): o Over the next 90 days, patient will work with PharmD and providers to control allergy symptoms . Current regimen:  . Fluticasone nasal spray 2 sprays in both nostrils daily . Loratadine 10 mg daily in the morning . Interventions: o Determined patient has continued Claritin daily (stopped Zyrtec) and states that her sinuses are good . Patient self care activities - Over the next 90 days, patient will: o Continue loratadine daily o Monitor allergy symptoms and be aware of blurry vision and dizziness  Medication management . Pharmacist Clinical Goal(s): o Over the next 90 days, patient will work with PharmD and providers to maintain optimal medication adherence . Current pharmacy: [facility] . Interventions o Comprehensive medication review performed. o Continue current medication management strategy o Determined patient is using Focus Factor memory supplement. Has not noticed significant difference yet but will give it a few months.  . Patient self care activities - Over  the next 90 days, patient will: o Focus on medication adherence by continued use of pill box o Take medications as prescribed o Report any questions or concerns to PharmD and/or provider(s)  Please see past updates related to this goal by clicking on the "Past Updates" button in the selected goal          The patient verbalized understanding of instructions, educational materials, and care plan provided today and declined offer to receive copy of patient instructions, educational materials, and care plan.   The pharmacy team will reach out to the patient again over the next 90 days.   Cherylin Mylar, PharmD Clinical Pharmacist [facility] [phone]

## 2020-04-09 ENCOUNTER — Other Ambulatory Visit: Payer: Self-pay

## 2020-04-09 MED ORDER — ACCU-CHEK GUIDE VI STRP: ORAL_STRIP | 11 refills | Status: DC

## 2020-04-10 ENCOUNTER — Other Ambulatory Visit: Payer: Self-pay

## 2020-04-10 MED ORDER — ACCU-CHEK GUIDE W/DEVICE KIT: PACK | 1 refills | Status: DC

## 2020-04-10 NOTE — Telephone Encounter (Signed)
I left the pt a message her prescription for a glucometer was faxed to her pharmacy.

## 2020-04-12 ENCOUNTER — Other Ambulatory Visit: Payer: Self-pay | Admitting: Internal Medicine

## 2020-04-15 ENCOUNTER — Encounter: Payer: Self-pay | Admitting: Internal Medicine

## 2020-04-15 ENCOUNTER — Other Ambulatory Visit: Payer: Self-pay

## 2020-04-15 ENCOUNTER — Ambulatory Visit (INDEPENDENT_AMBULATORY_CARE_PROVIDER_SITE_OTHER): Payer: Medicare Other | Admitting: Internal Medicine

## 2020-04-15 VITALS — Temp 97.7°F | Ht 64.0 in | Wt 173.0 lb

## 2020-04-15 DIAGNOSIS — R410 Disorientation, unspecified: Secondary | ICD-10-CM | POA: Diagnosis not present

## 2020-04-15 DIAGNOSIS — R42 Dizziness and giddiness: Secondary | ICD-10-CM | POA: Diagnosis not present

## 2020-04-15 DIAGNOSIS — Z79899 Other long term (current) drug therapy: Secondary | ICD-10-CM | POA: Diagnosis not present

## 2020-04-15 DIAGNOSIS — I129 Hypertensive chronic kidney disease with stage 1 through stage 4 chronic kidney disease, or unspecified chronic kidney disease: Secondary | ICD-10-CM | POA: Diagnosis not present

## 2020-04-15 DIAGNOSIS — E1122 Type 2 diabetes mellitus with diabetic chronic kidney disease: Secondary | ICD-10-CM

## 2020-04-15 DIAGNOSIS — N182 Chronic kidney disease, stage 2 (mild): Secondary | ICD-10-CM | POA: Diagnosis not present

## 2020-04-15 LAB — POCT URINALYSIS DIPSTICK
Bilirubin, UA: NEGATIVE
Blood, UA: NEGATIVE
Glucose, UA: NEGATIVE
Ketones, UA: NEGATIVE
Nitrite, UA: NEGATIVE
Protein, UA: NEGATIVE
Spec Grav, UA: 1.02 (ref 1.010–1.025)
Urobilinogen, UA: 0.2 E.U./dL
pH, UA: 6 (ref 5.0–8.0)

## 2020-04-15 LAB — POCT GLUCOSE (DEVICE FOR HOME USE): POC Glucose: 108 mg/dl — AB (ref 70–99)

## 2020-04-15 NOTE — Progress Notes (Signed)
I,[name],acting as a Education administrator for Maximino Greenland, MD.,have documented all relevant documentation on the behalf of Maximino Greenland, MD,as directed by  Maximino Greenland, MD while in the presence of Maximino Greenland, MD.  This visit occurred during the SARS-CoV-2 public health emergency.  Safety protocols were in place, including screening questions prior to the visit, additional usage of staff PPE, and extensive cleaning of exam room while observing appropriate contact time as indicated for disinfecting solutions.  Subjective:     Patient ID: April Larson , female    DOB: 02/26/1951 , 70 y.o.   MRN: [id number]   Chief Complaint  Patient presents with  . Dizziness    HPI  The patient is here today for evaluation of dizziness and fatigue. She is accompanied by her sister today. Her sister states that she has episodes where she appears to stare off into space. Just yesterday, she went to work at [employer]. She states she "wobbled" out of her car, and was unable to make it inside without assistance. She is not sure why she was feeling dizzy. She reports compliance with meds. Denies taking any new supplements/meds. She is not sure what could have contributed to her sx.   Had another episode where she can be in mid-conversation, and then she stops talking and appears to stare off into space. Denies h/o seizure disorder.  Dizziness This is a recurrent problem. The current episode started 1 to 4 weeks ago. The problem occurs intermittently. The problem has been unchanged. Associated symptoms include fatigue. Pertinent negatives include no headaches, rash, sore throat or swollen glands. She has tried rest for the symptoms.     Past Medical History:  Diagnosis Date  . Diabetes mellitus without complication ([place])   . Hypertension   . Substance abuse ([place])      Family History  Problem Relation Age of Onset  . Cancer Mother   . Diabetes Father   . Cancer Brother   .  Breast cancer Maternal Aunt      Current Outpatient Medications:  .  ACCU-CHEK FASTCLIX LANCETS MISC, USE TO CHECK BLOOD SUGARS TWICE DAILY, Disp: 100 each, Rfl: 11 .  ACCU-CHEK GUIDE test strip, USE TO CHECK BLOOD SUGARS TWICE DAILY AS DIRECTED, Disp: 100 strip, Rfl: 2 .  Ascorbic Acid (VITAMIN C PO), Take 1 tablet by mouth daily., Disp: , Rfl:  .  aspirin EC 81 MG tablet, Take 81 mg by mouth daily., Disp: , Rfl:  .  Biotin 10 MG CAPS, Take 1 tablet by mouth daily., Disp: , Rfl:  .  Blood Glucose Monitoring Suppl (ACCU-CHEK GUIDE) w/Device KIT, USE TO CHECK BLOOD SUGARS TWICE DAILY AS DIRECTED dx: e11.22, Disp: 1 kit, Rfl: 1 .  calcium carbonate (OSCAL) 1500 (600 Ca) MG TABS tablet, Take 600 mg of elemental calcium by mouth daily with breakfast., Disp: , Rfl:  .  Cholecalciferol 125 MCG (5000 UT) TABS, Take 5,000 Units by mouth daily. , Disp: , Rfl:  .  diclofenac Sodium (VOLTAREN) 1 % GEL, Apply 2 g topically 4 (four) times daily., Disp: 100 g, Rfl: 2 .  fluticasone (FLONASE) 50 MCG/ACT nasal spray, SHAKE LIQUID AND USE 2 SPRAYS IN EACH NOSTRIL DAILY, Disp: 16 g, Rfl: 2 .  LIVALO 4 MG TABS, TAKE 1 TABLET BY MOUTH DAILY, Disp: 90 tablet, Rfl: 1 .  loratadine (CLARITIN) 10 MG tablet, Take 10 mg by mouth daily. AM, Disp: , Rfl:  .  metFORMIN (GLUCOPHAGE)  500 MG tablet, Take 1 tablet (500 mg total) by mouth daily with breakfast. Dinner, Disp: 180 tablet, Rfl: 3 .  Multiple Vitamins-Minerals (ONE-A-DAY WOMENS 50+ ADVANTAGE PO), Take 1 tablet by mouth daily., Disp: , Rfl:  .  NON FORMULARY, 2 each. Calm, Disp: , Rfl:  .  omeprazole (PRILOSEC) 40 MG capsule, TAKE 1 CAPSULE BY MOUTH EVERY DAY BEFORE A MEAL, Disp: 90 capsule, Rfl: 1 .  telmisartan-hydrochlorothiazide (MICARDIS HCT) 40-12.5 MG tablet, TAKE 1 TABLET BY MOUTH DAILY, Disp: 90 tablet, Rfl: 1 .  Turmeric 500 MG CAPS, Take 1 capsule by mouth daily. , Disp: , Rfl:  .  XULTOPHY 100-3.6 UNIT-MG/ML SOPN, INJECT 25 UNITS INTO THE SKIN DAILY,  Disp: 27 mL, Rfl: 3 .  B-D ULTRAFINE III SHORT PEN 31G X 8 MM MISC, USE AS DIRECTED TWICE DAILY, Disp: 100 each, Rfl: 11   No Known Allergies   Review of Systems  Constitutional: Positive for fatigue.  HENT: Negative for sore throat.   Respiratory: Negative.   Cardiovascular: Negative.   Gastrointestinal: Negative.   Skin: Negative for rash.  Neurological: Positive for dizziness. Negative for headaches.  Psychiatric/Behavioral: Negative.   All other systems reviewed and are negative.    Today's Vitals   04/15/20 1442  Temp: 97.7 F (36.5 C)  TempSrc: Oral  Weight: 173 lb (78.5 kg)  Height: $Remove'5\' 4"'UfooBpI$  (1.626 m)  PainSc: 7   PainLoc: Back   Body mass index is 29.7 kg/m.  Wt Readings from Last 3 Encounters:  04/15/20 173 lb (78.5 kg)  04/03/20 175 lb 3.2 oz (79.5 kg)  03/27/20 176 lb (79.8 kg)   Objective:  Physical Exam Vitals and nursing note reviewed.  Constitutional:      Appearance: Normal appearance. She is obese.  HENT:     Head: Normocephalic and atraumatic.     Right Ear: Tympanic membrane, ear canal and external ear normal.     Left Ear: Tympanic membrane, ear canal and external ear normal.     Nose:     Comments: Masked     Mouth/Throat:     Comments: Masked  Cardiovascular:     Rate and Rhythm: Normal rate and regular rhythm.     Heart sounds: Normal heart sounds.  Pulmonary:     Breath sounds: Normal breath sounds.  Musculoskeletal:     Cervical back: Normal range of motion.  Skin:    General: Skin is warm.  Neurological:     General: No focal deficit present.     Mental Status: She is alert and oriented to person, place, and time.         Assessment And Plan:     1. Dizziness Comments: EKG performed, NSR w/o new changes. She is not orthostatic. She is advised to stay well hydrated. Will consider radiographic studies if persistent.  - CBC no Diff - EKG 12-Lead  2. Episode of confusion Comments: I will check for possible metabolic causes for  her symptoms. She and her sister are asked to document frequency of episodes as well. If persistent, will refer to Neuro for further evaluation.  - TSH - POCT Urinalysis Dipstick (81002) - POCT Glucose (Device for Home Use)  3. 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 Encouraged to follow dietary guidelines.  - CMP14+EGFR - Hemoglobin A1c - CBC no Diff  4. Hypertensive nephropathy Comments: Uncontrolled, likely exacerbated about uncertainty of her condition. Encouraged to follow  a low salt diet and to limit her intake of packaged foods.   5. Drug therapy - Vitamin B12   Patient was given opportunity to ask questions. Patient verbalized understanding of the plan and was able to repeat key elements of the plan. All questions were answered to their satisfaction.   I, Maximino Greenland, MD, have reviewed all documentation for this visit. The documentation on 05/24/20 for the exam, diagnosis, procedures, and orders are all accurate and complete.  THE PATIENT IS ENCOURAGED TO PRACTICE SOCIAL DISTANCING DUE TO THE COVID-19 PANDEMIC.

## 2020-04-15 NOTE — Patient Instructions (Signed)
Change omeprazole to Monday Wednesday Friday dosing

## 2020-04-16 LAB — CBC
Hematocrit: 37.7 % (ref 34.0–46.6)
Hemoglobin: 12.4 g/dL (ref 11.1–15.9)
MCH: 26.7 pg (ref 26.6–33.0)
MCHC: 32.9 g/dL (ref 31.5–35.7)
MCV: 81 fL (ref 79–97)
Platelets: 480 10*3/uL — ABNORMAL HIGH (ref 150–450)
RBC: 4.64 x10E6/uL (ref 3.77–5.28)
RDW: 13.5 % (ref 11.7–15.4)
WBC: 10.1 10*3/uL (ref 3.4–10.8)

## 2020-04-16 LAB — CMP14+EGFR
ALT: 11 IU/L (ref 0–32)
AST: 16 IU/L (ref 0–40)
Albumin/Globulin Ratio: 1.4 (ref 1.2–2.2)
Albumin: 4.4 g/dL (ref 3.8–4.8)
Alkaline Phosphatase: 196 IU/L — ABNORMAL HIGH (ref 44–121)
BUN/Creatinine Ratio: 24 (ref 12–28)
BUN: 22 mg/dL (ref 8–27)
Bilirubin Total: <0.2 mg/dL (ref 0.0–1.2)
CO2: 25 mmol/L (ref 20–29)
Calcium: 10 mg/dL (ref 8.7–10.3)
Chloride: 103 mmol/L (ref 96–106)
Creatinine, Ser: 0.91 mg/dL (ref 0.57–1.00)
GFR calc Af Amer: 75 mL/min/{1.73_m2} (ref >59)
GFR calc non Af Amer: 65 mL/min/{1.73_m2} (ref >59)
Globulin, Total: 3.2 g/dL (ref 1.5–4.5)
Glucose: 90 mg/dL (ref 65–99)
Potassium: 4.4 mmol/L (ref 3.5–5.2)
Sodium: 141 mmol/L (ref 134–144)
Total Protein: 7.6 g/dL (ref 6.0–8.5)

## 2020-04-16 LAB — TSH: TSH: 1.83 u[IU]/mL (ref 0.450–4.500)

## 2020-04-16 LAB — HEMOGLOBIN A1C
Est. average glucose Bld gHb Est-mCnc: 157 mg/dL
Hgb A1c MFr Bld: 7.1 % — ABNORMAL HIGH (ref 4.8–5.6)

## 2020-04-16 LAB — VITAMIN B12: Vitamin B-12: 1323 pg/mL — ABNORMAL HIGH (ref 232–1245)

## 2020-04-23 ENCOUNTER — Other Ambulatory Visit: Payer: Self-pay | Admitting: Internal Medicine

## 2020-04-23 DIAGNOSIS — R748 Abnormal levels of other serum enzymes: Secondary | ICD-10-CM

## 2020-04-24 ENCOUNTER — Other Ambulatory Visit: Payer: Medicare Other

## 2020-04-24 ENCOUNTER — Other Ambulatory Visit: Payer: Self-pay

## 2020-04-24 DIAGNOSIS — R748 Abnormal levels of other serum enzymes: Secondary | ICD-10-CM | POA: Diagnosis not present

## 2020-04-27 LAB — ALKALINE PHOSPHATASE, ISOENZYMES
Alkaline Phosphatase: 161 IU/L — ABNORMAL HIGH (ref 44–121)
BONE FRACTION: 51 % (ref 14–68)
INTESTINAL FRAC.: 5 % (ref 0–18)
LIVER FRACTION: 44 % (ref 18–85)

## 2020-04-28 ENCOUNTER — Other Ambulatory Visit: Payer: Self-pay | Admitting: Internal Medicine

## 2020-04-28 ENCOUNTER — Telehealth: Payer: Medicare Other

## 2020-04-28 DIAGNOSIS — R748 Abnormal levels of other serum enzymes: Secondary | ICD-10-CM

## 2020-04-28 DIAGNOSIS — G4733 Obstructive sleep apnea (adult) (pediatric): Secondary | ICD-10-CM | POA: Diagnosis not present

## 2020-04-30 ENCOUNTER — Telehealth: Payer: Self-pay

## 2020-04-30 NOTE — Telephone Encounter (Signed)
I called the pt to check and see how she is doing

## 2020-05-14 ENCOUNTER — Ambulatory Visit
Admission: RE | Admit: 2020-05-14 | Discharge: 2020-05-14 | Disposition: A | Discharge status: Home / Self Care | Payer: Medicare Other | Source: Ambulatory Visit | Attending: Internal Medicine | Admitting: Internal Medicine

## 2020-05-14 DIAGNOSIS — K7689 Other specified diseases of liver: Secondary | ICD-10-CM | POA: Diagnosis not present

## 2020-05-14 DIAGNOSIS — R748 Abnormal levels of other serum enzymes: Secondary | ICD-10-CM

## 2020-05-15 ENCOUNTER — Telehealth: Payer: Self-pay

## 2020-05-15 NOTE — Chronic Care Management (AMB) (Signed)
Chronic Care Management Pharmacy Assistant   Name: April Larson  MRN: [id number] DOB: 11-08-1950  Reason for Encounter: Hypertension Adherence Call  Patient Questions:  1.  Have you seen any other providers since your last visit? Yes, 04/15/20-Sander, Bailey Mech, MD ([facility]).    2.  Any changes in your medicines or health? No     PCP : Glendale Chard, MD  Allergies:  No Known Allergies  Medications: Outpatient Encounter Medications as of 05/15/2020  Medication Sig  . ACCU-CHEK FASTCLIX LANCETS MISC USE TO CHECK BLOOD SUGARS TWICE DAILY  . ACCU-CHEK GUIDE test strip USE TO CHECK BLOOD SUGARS TWICE DAILY AS DIRECTED  . Ascorbic Acid (VITAMIN C PO) Take 1 tablet by mouth daily.  Marland Kitchen aspirin EC 81 MG tablet Take 81 mg by mouth daily.  . Biotin 10 MG CAPS Take 1 tablet by mouth daily.  . Blood Glucose Monitoring Suppl (ACCU-CHEK GUIDE) w/Device KIT USE TO CHECK BLOOD SUGARS TWICE DAILY AS DIRECTED dx: e11.22  . calcium carbonate (OSCAL) 1500 (600 Ca) MG TABS tablet Take 600 mg of elemental calcium by mouth daily with breakfast.  . Cholecalciferol 125 MCG (5000 UT) TABS Take 5,000 Units by mouth daily.   . diclofenac Sodium (VOLTAREN) 1 % GEL Apply 2 g topically 4 (four) times daily.  . fluticasone (FLONASE) 50 MCG/ACT nasal spray SHAKE LIQUID AND USE 2 SPRAYS IN EACH NOSTRIL DAILY  . Insulin Pen Needle (B-D ULTRAFINE III SHORT PEN) 31G X 8 MM MISC USE AS DIRECTED TWICE DAILY  . LIVALO 4 MG TABS TAKE 1 TABLET BY MOUTH DAILY  . loratadine (CLARITIN) 10 MG tablet Take 10 mg by mouth daily. AM  . metFORMIN (GLUCOPHAGE) 500 MG tablet Take 1 tablet (500 mg total) by mouth daily with breakfast. Dinner  . Multiple Vitamins-Minerals (ONE-A-DAY WOMENS 50+ ADVANTAGE PO) Take 1 tablet by mouth daily.  . NON FORMULARY 2 each. Calm  . omeprazole (PRILOSEC) 40 MG capsule TAKE 1 CAPSULE BY MOUTH EVERY DAY BEFORE A MEAL  . telmisartan-hydrochlorothiazide (MICARDIS HCT) 40-12.5 MG tablet  TAKE 1 TABLET BY MOUTH DAILY  . Turmeric 500 MG CAPS Take 1 capsule by mouth daily.   [name] 100-3.6 UNIT-MG/ML SOPN INJECT 25 UNITS INTO THE SKIN DAILY   No facility-administered encounter medications on file as of 05/15/2020.    Current Diagnosis: Patient Active Problem List   Diagnosis Date Noted  . Pure hypercholesterolemia 01/23/2020  . Vitamin D deficiency 10/23/2019  . Right hip pain 05/15/2018  . Estrogen deficiency 05/15/2018  . Class 1 obesity due to excess calories with serious comorbidity and body mass index (BMI) of 31.0 to 31.9 in adult 05/15/2018  . Diabetes mellitus with stage 2 chronic kidney disease ([place]) 01/11/2018  . Benign hypertension with chronic kidney disease, stage II 01/11/2018    Reviewed chart prior to disease state call. Spoke with patient regarding BP  Recent Office Vitals: BP Readings from Last 3 Encounters:  04/03/20 124/82  03/27/20 (!) 152/83  01/23/20 138/64   Pulse Readings from Last 3 Encounters:  04/03/20 82  03/27/20 76  01/23/20 85    Wt Readings from Last 3 Encounters:  04/15/20 173 lb (78.5 kg)  04/03/20 175 lb 3.2 oz (79.5 kg)  03/27/20 176 lb (79.8 kg)     Kidney Function Lab Results  Component Value Date/Time   CREATININE 0.91 04/15/2020 04:07 PM   CREATININE 1.03 (H) 03/27/2020 02:24 AM   GFRNONAA 65 04/15/2020 04:07 PM  GFRNONAA 59 (L) 03/27/2020 02:24 AM   GFRAA 75 04/15/2020 04:07 PM    BMP Latest Ref Rng & Units 04/15/2020 03/27/2020 01/23/2020  Glucose 65 - 99 mg/dL 90 175(H) 185(H)  BUN 8 - 27 mg/dL 22 28(H) 20  Creatinine 0.57 - 1.00 mg/dL 0.91 1.03(H) 1.14(H)  BUN/Creat Ratio 12 - 28 24 - 18  Sodium 134 - 144 mmol/L 141 137 138  Potassium 3.5 - 5.2 mmol/L 4.4 3.6 4.4  Chloride 96 - 106 mmol/L 103 104 101  CO2 20 - 29 mmol/L _0 Calcium 8.7 - 10.3 mg/dL 10.0 9.0 9.6    . Current antihypertensive regimen:  ? Telmisartan-HCTZ 40-12.5 mg daily  . How often are you checking your Blood Pressure?  Patient states she checks every night.   . Current home BP readings: 180/92 a week ago. 146/76 last night she believes per patient.  . What recent interventions/DTPs have been made by any provider to improve Blood Pressure control since last CPP Visit:  - Recent interventions was to check BP periodically and if symptomatic, document, and provide at future appointments, Ensure daily salt intake < 2300 mg/day, Exercise at least 30 minutes daily, 5 times per week, Try PLATE method for meal planning.  . Any recent hospitalizations or ED visits since last visit with CPP? No   . What diet changes have been made to improve Blood Pressure Control?  o Patient reports she has not changed her meal habits except she has cut back from eating pepperoni. o Patient states she uses sea salt or kosher salt when cooking boiled eggs or pasta. o Patient states she has replaced her breakfast meals with Boost Nutrition Shakes that contain (20 grams of protein)  . What exercise is being done to improve your Blood Pressure Control?  o Patient states she walks the dog in the yard but not adding up to 30 minutes as suggested by CPP.  Adherence Review: Is the patient currently on ACE/ARB medication? No Does the patient have >5 day gap between last estimated fill dates? No    Goals Addressed            This Visit's Progress   . Pharmacy Care Plan   Not on track    [place] (see longitudinal plan of care for additional care plan information)  Current Barriers:  . Chronic Disease Management support, education, and care coordination needs related to Hypertension, Hyperlipidemia, and Diabetes   Hypertension BP Readings from Last 3 Encounters:  10/23/19 128/72  06/27/19 (!) 154/74  06/20/19 126/78   . Pharmacist Clinical Goal(s): o Over the next 90 days, patient will work with PharmD and providers to maintain BP goal <130/80 . Current regimen:  o Telmisartan-HCTZ 40-12.5 mg  daily . Interventions: o Recommend patient check blood pressure periodically and if symptomatic o Provided dietary and exercise recommendations - PLATE method discussed . Patient self care activities - Over the next 90 days, patient will: o Check BP periodically and if symptomatic, document, and provide at future appointments o Ensure daily salt intake < 2300 mg/day o Exercise at least 30 minutes daily, 5 times per week o Try PLATE method for meal planning  Hyperlipidemia Lab Results  Component Value Date/Time   LDLCALC 110 (H) 10/23/2019 03:31 PM   . Pharmacist Clinical Goal(s): o Over the next 180 days, patient will work with PharmD and providers to maintain LDL goal < 70 . Current regimen:  o Livalo 44m daily o Aspirin  81 mg daily  . Interventions: o Provided dietary and exercise recommendations . Patient self care activities - Over the next 180 days, patient will: o Take cholesterol medication daily o Exercise at least 30 minutes daily, 5 times per week o Begin [employer] program on [employer] chair exercises o Diet modifications   Diabetes Lab Results  Component Value Date/Time   HGBA1C 7.4 (H) 10/23/2019 03:34 PM   HGBA1C 7.2 (H) 06/20/2019 05:32 PM   . Pharmacist Clinical Goal(s): o Over the next 90 days, patient will work with PharmD and providers to achieve A1c goal <7% . Current regimen:   Xultophy 100-3.6 unit-mg/mL 25 units into skin daily  Metformin 500 mg daily with dinner . Interventions: o Provided dietary and exercise recommendations o Discussed appropriate goals for fasting blood sugar (80-130) and 2 hours after eating (less than 180) . Patient self care activities - Over the next 90 days, patient will: o Check blood sugar twice daily and at bedtime, document, and provide at future appointments o Contact provider with any episodes of hypoglycemia o Exercise at least 30 minutes daily, 5 times per week  Allergies . Pharmacist Clinical  Goal(s): o Over the next 90 days, patient will work with PharmD and providers to control allergy symptoms . Current regimen:  . Fluticasone nasal spray 2 sprays in both nostrils daily . Loratadine 10 mg daily in the morning . Interventions: o Determined patient has continued Claritin daily (stopped Zyrtec) and states that her sinuses are good . Patient self care activities - Over the next 90 days, patient will: o Continue loratadine daily o Monitor allergy symptoms and be aware of blurry vision and dizziness  Medication management . Pharmacist Clinical Goal(s): o Over the next 90 days, patient will work with PharmD and providers to maintain optimal medication adherence . Current pharmacy: [facility] . Interventions o Comprehensive medication review performed. o Continue current medication management strategy o Determined patient is using Focus Factor memory supplement. Has not noticed significant difference yet but will give it a few months.  . Patient self care activities - Over the next 90 days, patient will: o Focus on medication adherence by continued use of pill box o Take medications as prescribed o Report any questions or concerns to PharmD and/or provider(s)  Please see past updates related to this goal by clicking on the "Past Updates" button in the selected goal         Follow-Up:  Pharmacist Review-Patient states she doesn't recall what she was doing to cause her blood pressure to raise and it's concerning. Patient is inquiring on when she should take her Telmisartan-HCTZ 40-12.5 mg in the day and the benefits of drinking the [place].  Orlando Penner, CPP Notified.  Raynelle Highland, [place] Pharmacist Assistant [phone] CCM Total Time:22 minutes

## 2020-05-19 ENCOUNTER — Encounter: Payer: Self-pay | Admitting: Internal Medicine

## 2020-05-19 DIAGNOSIS — H04123 Dry eye syndrome of bilateral lacrimal glands: Secondary | ICD-10-CM | POA: Diagnosis not present

## 2020-05-19 DIAGNOSIS — H1045 Other chronic allergic conjunctivitis: Secondary | ICD-10-CM | POA: Diagnosis not present

## 2020-05-19 DIAGNOSIS — Z961 Presence of intraocular lens: Secondary | ICD-10-CM | POA: Diagnosis not present

## 2020-05-19 DIAGNOSIS — E119 Type 2 diabetes mellitus without complications: Secondary | ICD-10-CM | POA: Diagnosis not present

## 2020-05-19 DIAGNOSIS — H26492 Other secondary cataract, left eye: Secondary | ICD-10-CM | POA: Diagnosis not present

## 2020-05-19 DIAGNOSIS — Z794 Long term (current) use of insulin: Secondary | ICD-10-CM | POA: Diagnosis not present

## 2020-05-20 ENCOUNTER — Other Ambulatory Visit: Payer: Self-pay | Admitting: Internal Medicine

## 2020-05-24 ENCOUNTER — Other Ambulatory Visit: Payer: Self-pay | Admitting: Internal Medicine

## 2020-05-24 DIAGNOSIS — R410 Disorientation, unspecified: Secondary | ICD-10-CM

## 2020-05-24 DIAGNOSIS — R42 Dizziness and giddiness: Secondary | ICD-10-CM

## 2020-05-29 DIAGNOSIS — Z03818 Encounter for observation for suspected exposure to other biological agents ruled out: Secondary | ICD-10-CM | POA: Diagnosis not present

## 2020-05-29 DIAGNOSIS — Z23 Encounter for immunization: Secondary | ICD-10-CM | POA: Diagnosis not present

## 2020-06-02 ENCOUNTER — Telehealth: Payer: Medicare Other

## 2020-06-07 ENCOUNTER — Ambulatory Visit
Admission: RE | Admit: 2020-06-07 | Discharge: 2020-06-07 | Disposition: A | Discharge status: Home / Self Care | Payer: Medicare Other | Source: Ambulatory Visit | Attending: Internal Medicine | Admitting: Internal Medicine

## 2020-06-07 ENCOUNTER — Other Ambulatory Visit: Payer: Self-pay

## 2020-06-07 DIAGNOSIS — G9389 Other specified disorders of brain: Secondary | ICD-10-CM | POA: Diagnosis not present

## 2020-06-07 DIAGNOSIS — R42 Dizziness and giddiness: Secondary | ICD-10-CM | POA: Diagnosis not present

## 2020-06-07 DIAGNOSIS — I6782 Cerebral ischemia: Secondary | ICD-10-CM | POA: Diagnosis not present

## 2020-06-07 DIAGNOSIS — R413 Other amnesia: Secondary | ICD-10-CM | POA: Diagnosis not present

## 2020-06-07 DIAGNOSIS — R410 Disorientation, unspecified: Secondary | ICD-10-CM

## 2020-06-07 MED ORDER — GADOBENATE DIMEGLUMINE 529 MG/ML IV SOLN
20.0000 mL | Freq: Once | INTRAVENOUS | Status: AC | PRN
  Administered 2020-06-07: 20 mL via INTRAVENOUS

## 2020-06-17 LAB — HM DIABETES EYE EXAM

## 2020-06-19 ENCOUNTER — Encounter: Payer: Self-pay | Admitting: Internal Medicine

## 2020-06-19 ENCOUNTER — Other Ambulatory Visit: Payer: Self-pay

## 2020-06-19 ENCOUNTER — Ambulatory Visit (INDEPENDENT_AMBULATORY_CARE_PROVIDER_SITE_OTHER): Payer: Medicare Other | Admitting: Internal Medicine

## 2020-06-19 ENCOUNTER — Ambulatory Visit (INDEPENDENT_AMBULATORY_CARE_PROVIDER_SITE_OTHER): Payer: Medicare Other

## 2020-06-19 VITALS — BP 130/62 | HR 84 | Temp 97.7°F | Ht 64.0 in | Wt 171.2 lb

## 2020-06-19 VITALS — BP 130/62 | HR 64 | Temp 97.7°F | Ht 64.0 in | Wt 171.2 lb

## 2020-06-19 DIAGNOSIS — E663 Overweight: Secondary | ICD-10-CM

## 2020-06-19 DIAGNOSIS — N182 Chronic kidney disease, stage 2 (mild): Secondary | ICD-10-CM | POA: Diagnosis not present

## 2020-06-19 DIAGNOSIS — R410 Disorientation, unspecified: Secondary | ICD-10-CM

## 2020-06-19 DIAGNOSIS — I129 Hypertensive chronic kidney disease with stage 1 through stage 4 chronic kidney disease, or unspecified chronic kidney disease: Secondary | ICD-10-CM | POA: Diagnosis not present

## 2020-06-19 DIAGNOSIS — Z0000 Encounter for general adult medical examination without abnormal findings: Secondary | ICD-10-CM | POA: Diagnosis not present

## 2020-06-19 DIAGNOSIS — E1122 Type 2 diabetes mellitus with diabetic chronic kidney disease: Secondary | ICD-10-CM

## 2020-06-19 DIAGNOSIS — Z6829 Body mass index (BMI) 29.0-29.9, adult: Secondary | ICD-10-CM

## 2020-06-19 NOTE — Progress Notes (Signed)
This visit occurred during the SARS-CoV-2 public health emergency.  Safety protocols were in place, including screening questions prior to the visit, additional usage of staff PPE, and extensive cleaning of exam room while observing appropriate contact time as indicated for disinfecting solutions.  Subjective:   April Larson is a 70 y.o. female who presents for Medicare Annual (Subsequent) preventive examination.  Review of Systems     Cardiac Risk Factors include: advanced age (>70men, >24 women);diabetes mellitus;hypertension;sedentary lifestyle     Objective:    Today's Vitals   06/19/20 1101  BP: 130/62  Pulse: 84  Temp: 97.7 F (36.5 C)  TempSrc: Oral  SpO2: 97%  Weight: 171 lb 3.2 oz (77.7 kg)  Height: $Remove'5\' 4"'BMCAmXE$  (1.626 m)   Body mass index is 29.39 kg/m.  Advanced Directives 06/19/2020 03/27/2020 06/20/2019 07/26/2018 04/05/2016 09/18/2015  Does Patient Have a Medical Advance Directive? No No No No No No  Would patient like information on creating a medical advance directive? Yes (MAU/Ambulatory/Procedural Areas - Information given) - Yes (MAU/Ambulatory/Procedural Areas - Information given) No - Patient declined - -    Current Medications (verified) Outpatient Encounter Medications as of 06/19/2020  Medication Sig  . ACCU-CHEK FASTCLIX LANCETS MISC USE TO CHECK BLOOD SUGARS TWICE DAILY  . ACCU-CHEK GUIDE test strip USE TO CHECK BLOOD SUGARS TWICE DAILY AS DIRECTED  . Ascorbic Acid (VITAMIN C PO) Take 1 tablet by mouth daily.  Marland Kitchen aspirin EC 81 MG tablet Take 81 mg by mouth daily.  . B-D ULTRAFINE III SHORT PEN 31G X 8 MM MISC USE AS DIRECTED TWICE DAILY  . Biotin 10 MG CAPS Take 1 tablet by mouth daily.  . Blood Glucose Monitoring Suppl (ACCU-CHEK GUIDE) w/Device KIT USE TO CHECK BLOOD SUGARS TWICE DAILY AS DIRECTED dx: e11.22  . calcium carbonate (OSCAL) 1500 (600 Ca) MG TABS tablet Take 600 mg of elemental calcium by mouth daily with breakfast.  .  Cholecalciferol 125 MCG (5000 UT) TABS Take 5,000 Units by mouth daily.   . diclofenac Sodium (VOLTAREN) 1 % GEL Apply 2 g topically 4 (four) times daily.  . fluticasone (FLONASE) 50 MCG/ACT nasal spray SHAKE LIQUID AND USE 2 SPRAYS IN EACH NOSTRIL DAILY  . LIVALO 4 MG TABS TAKE 1 TABLET BY MOUTH DAILY  . loratadine (CLARITIN) 10 MG tablet Take 10 mg by mouth daily. AM  . metFORMIN (GLUCOPHAGE) 500 MG tablet Take 1 tablet (500 mg total) by mouth daily with breakfast. Dinner  . Multiple Vitamins-Minerals (ONE-A-DAY WOMENS 50+ ADVANTAGE PO) Take 1 tablet by mouth daily.  . NON FORMULARY 2 each. Calm  . omeprazole (PRILOSEC) 40 MG capsule TAKE 1 CAPSULE BY MOUTH EVERY DAY BEFORE A MEAL  . telmisartan-hydrochlorothiazide (MICARDIS HCT) 40-12.5 MG tablet TAKE 1 TABLET BY MOUTH DAILY  . Turmeric 500 MG CAPS Take 1 capsule by mouth daily.   [name] 100-3.6 UNIT-MG/ML SOPN INJECT 25 UNITS INTO THE SKIN DAILY   No facility-administered encounter medications on file as of 06/19/2020.    Allergies (verified) Patient has no known allergies.   History: Past Medical History:  Diagnosis Date  . Diabetes mellitus without complication ([place])   . Hypertension   . Substance abuse [facility])    Past Surgical History:  Procedure Laterality Date  . EYE SURGERY     Family History  Problem Relation Age of Onset  . Cancer Mother   . Diabetes Father   . Cancer Brother   . Breast cancer Maternal Aunt  Social History   Socioeconomic History  . Marital status: Widowed    Spouse name: Not on file  . Number of children: Not on file  . Years of education: Not on file  . Highest education level: Not on file  Occupational History  . Occupation: retired  Tobacco Use  . Smoking status: Former Smoker    Packs/day: 1.00    Years: 30.00    Pack years: 30.00    Types: Cigarettes    Quit date: 2004    Years since quitting: 18.2  . Smokeless tobacco: Never Used  Vaping Use  . Vaping Use: Never used   Substance and Sexual Activity  . Alcohol use: No    Alcohol/week: 0.0 standard drinks  . Drug use: No  . Sexual activity: Not Currently  Other Topics Concern  . Not on file  Social History Narrative  . Not on file   Social Determinants of Health   Financial Resource Strain: Low Risk   . Difficulty of Paying Living Expenses: Not hard at all  Food Insecurity: No Food Insecurity  . Worried About Charity fundraiser in the Last Year: Never true  . Ran Out of Food in the Last Year: Never true  Transportation Needs: No Transportation Needs  . Lack of Transportation (Medical): No  . Lack of Transportation (Non-Medical): No  Physical Activity: Inactive  . Days of Exercise per Week: 0 days  . Minutes of Exercise per Session: 0 min  Stress: No Stress Concern Present  . Feeling of Stress : Not at all  Social Connections: Not on file    Tobacco Counseling Counseling given: Not Answered   Clinical Intake:  Pre-visit preparation completed: Yes  Pain : No/denies pain     Nutritional Status: BMI 25 -29 Overweight Nutritional Risks: None Diabetes: Yes  How often do you need to have someone help you when you read instructions, pamphlets, or other written materials from your doctor or pharmacy?: 1 - Never What is the last grade level you completed in school?: higher college  Diabetic? Yes Nutrition Risk Assessment:  Has the patient had any N/V/D within the last 2 months?  No  Does the patient have any non-healing wounds?  No  Has the patient had any unintentional weight loss or weight gain?  No   Diabetes:  Is the patient diabetic?  Yes  If diabetic, was a CBG obtained today?  No  Did the patient bring in their glucometer from home?  No  How often do you monitor your CBG's? daily.   Financial Strains and Diabetes Management:  Are you having any financial strains with the device, your supplies or your medication? No .  Does the patient want to be seen by Chronic Care  Management for management of their diabetes?  No  Would the patient like to be referred to a Nutritionist or for Diabetic Management?  No   Diabetic Exams:  Diabetic Eye Exam: Completed 06/17/2020 Diabetic Foot Exam: Completed 10/23/2019   Interpreter Needed?: No  Information entered by :: NAllen LPN   Activities of Daily Living In your present state of health, do you have any difficulty performing the following activities: 06/19/2020 06/20/2019  Hearing? Y Y  Comment decreased hearing of right ear reads lips, right ear decreased hearing  Vision? N N  Difficulty concentrating or making decisions? N N  Walking or climbing stairs? N N  Dressing or bathing? N N  Doing errands, shopping? N [employer]  and eating ? N N  Using the Toilet? N N  In the past six months, have you accidently leaked urine? N N  Do you have problems with loss of bowel control? N N  Managing your Medications? N N  Managing your Finances? N N  Housekeeping or managing your Housekeeping? - N  Some recent data might be hidden    Patient Care Team: Glendale Chard, MD as PCP - General (Internal Medicine) Warden Fillers, MD as Consulting Physician (Ophthalmology) Caudill, Kennieth Francois, [facility] (Inactive) (Pharmacist)  Indicate any recent Medical Services you may have received from other than [facility] providers in the past year (date may be approximate).     Assessment:   This is a routine wellness examination for [place].  Hearing/Vision screen  Hearing Screening   '125Hz'$  $Remo'250Hz'irsJu$'500Hz'$'1000Hz'$'2000Hz'$'3000Hz'$'4000Hz'$'6000Hz'$'8000Hz'$   Right ear:           Left ear:           Vision Screening Comments: Regular eye exams, Dr. Katy Fitch  Dietary issues and exercise activities discussed: Current Exercise Habits: The patient does not participate in regular exercise at present  Goals    .  "to keep my BP well controlled" (pt-stated)      CARE PLAN ENTRY (see longitudinal plan of care for additional care plan  information)  Current Barriers:  Marland Kitchen Knowledge Deficits related to disease process and Self Health management of HTN . Chronic Disease Management support and education needs related to DM II with stage 2 CKD, Hypertensive Nephropathy  Nurse Case Manager Clinical Goal(s):  Marland Kitchen Over the next 90 days, patient will work with the CCM team and PCP to address needs related to disease education and support to improve Self Health management of HTN  CCM RN CM Interventions:  01/24/20 call completed with patient  . Inter-disciplinary care team collaboration (see longitudinal plan of care) . Evaluation of current treatment plan related to Hypertension and patient's adherence to plan as established by provider. Marland Kitchen Re-educated patient re: target BP <130/80; Educated on dietary and exercise recommendations; Reviewed and discussed OV BP 138/64 . Reviewed current medication regimen for HTN:  o Telmisartan-HCTZ 40-12.5 mg daily . Confirmed patient received and reviewed printed educational materials related to What is High Blood Pressure; Life's Simple 7; Why Should I Lower Sodium? . Advised patient, providing education and rationale, to monitor blood pressure daily and record, calling the CCM team and or PCP for findings outside established parameters . Discussed plans with patient for ongoing care management follow up and provided patient with direct contact information for care management team  Patient Self Care Activities:  . Self administers medications as prescribed . Attends all scheduled provider appointments . Calls pharmacy for medication refills . Performs ADL's independently . Performs IADL's independently . Calls provider office for new concerns or questions  Please see past updates related to this goal by clicking on the "Past Updates" button in the selected goal      .  "to keep my diabetes under good control" (pt-stated)      CARE PLAN ENTRY (see longitudinal plan of care for additional care  plan information)  Current Barriers:  Marland Kitchen Knowledge Deficits related to disease process and Self Health management of DM . Chronic Disease Management support and education needs related to DM II with stage 2 CKD, Hypertensive Nephropathy  Nurse Case Manager Clinical Goal(s):  . New 01/24/20 Over the next 180 days, patient will work with the CCM  team and PCP to address needs related to disease education and support to help improve Self Health management of DM  CCM RN CM Interventions:  01/24/20 call completed with patient  . Inter-disciplinary care team collaboration (see longitudinal plan of care) . Provided education to patient and/or caregiver about advanced directives . Provided education to patient re: current A1c is noted to be 7.5; Educated on target A1c <7.0, Re-educated on daily glycemic control; FBS 80-130, <180 after meals; Re-educated on 15'15' rule; Re-Educated on recommendations for daily exercise, 30 min/5x week . Reviewed medications with patient and discussed patient understands and adheres to her pharmacological DM recommendations  Xultophy 100-3.6 unit-mg/mL 25 units into skin daily  Metformin 500 mg daily with dinne . Confirmed patient received and reviewed printed educational materials related to Diabetes Management using the Plate Method; Diabetes Care Schedule; Preventing Complications with Diabetes; Grocery Shopping with Diabetes  . Advised patient, providing education and rationale, to check cbg 1-2 times daily before meals and record, calling the CCM team and or PCP for findings outside established parameters . Discussed plans with patient for ongoing care management follow up and provided patient with direct contact information for care management team   Patient Self Care Activities:  . Self administers medications as prescribed . Attends all scheduled provider appointments . Calls pharmacy for medication refills . Calls provider office for new concerns or  questions  Please see past updates related to this goal by clicking on the "Past Updates" button in the selected goal      .  Patient Stated      06/20/2019, no goals     .  Patient Stated      06/19/2020, no goals    .  Pharmacy Care Plan      CARE PLAN ENTRY (see longitudinal plan of care for additional care plan information)  Current Barriers:  . Chronic Disease Management support, education, and care coordination needs related to Hypertension, Hyperlipidemia, and Diabetes   Hypertension BP Readings from Last 3 Encounters:  10/23/19 128/72  06/27/19 (!) 154/74  06/20/19 126/78   . Pharmacist Clinical Goal(s): o Over the next 90 days, patient will work with PharmD and providers to maintain BP goal <130/80 . Current regimen:  o Telmisartan-HCTZ 40-12.5 mg daily . Interventions: o Recommend patient check blood pressure periodically and if symptomatic o Provided dietary and exercise recommendations - PLATE method discussed . Patient self care activities - Over the next 90 days, patient will: o Check BP periodically and if symptomatic, document, and provide at future appointments o Ensure daily salt intake < 2300 mg/day o Exercise at least 30 minutes daily, 5 times per week o Try PLATE method for meal planning  Hyperlipidemia Lab Results  Component Value Date/Time   LDLCALC 110 (H) 10/23/2019 03:31 PM   . Pharmacist Clinical Goal(s): o Over the next 180 days, patient will work with PharmD and providers to maintain LDL goal < 70 . Current regimen:  o Livalo $Remove'4mg'fhRWUhQ$  daily o Aspirin 81 mg daily  . Interventions: o Provided dietary and exercise recommendations . Patient self care activities - Over the next 180 days, patient will: o Take cholesterol medication daily o Exercise at least 30 minutes daily, 5 times per week o Begin Silver Sneakers program on [employer] chair exercises o Diet modifications   Diabetes Lab Results  Component Value Date/Time   HGBA1C 7.4 (H)  10/23/2019 03:34 PM   HGBA1C 7.2 (H) 06/20/2019 05:32 PM   . Pharmacist Clinical  Goal(s): o Over the next 90 days, patient will work with PharmD and providers to achieve A1c goal <7% . Current regimen:   Xultophy 100-3.6 unit-mg/mL 25 units into skin daily  Metformin 500 mg daily with dinner . Interventions: o Provided dietary and exercise recommendations o Discussed appropriate goals for fasting blood sugar (80-130) and 2 hours after eating (less than 180) . Patient self care activities - Over the next 90 days, patient will: o Check blood sugar twice daily and at bedtime, document, and provide at future appointments o Contact provider with any episodes of hypoglycemia o Exercise at least 30 minutes daily, 5 times per week  Allergies . Pharmacist Clinical Goal(s): o Over the next 90 days, patient will work with PharmD and providers to control allergy symptoms . Current regimen:  . Fluticasone nasal spray 2 sprays in both nostrils daily . Loratadine 10 mg daily in the morning . Interventions: o Determined patient has continued Claritin daily (stopped Zyrtec) and states that her sinuses are good . Patient self care activities - Over the next 90 days, patient will: o Continue loratadine daily o Monitor allergy symptoms and be aware of blurry vision and dizziness  Medication management . Pharmacist Clinical Goal(s): o Over the next 90 days, patient will work with PharmD and providers to maintain optimal medication adherence . Current pharmacy: [facility] . Interventions o Comprehensive medication review performed. o Continue current medication management strategy o Determined patient is using Focus Factor memory supplement. Has not noticed significant difference yet but will give it a few months.  . Patient self care activities - Over the next 90 days, patient will: o Focus on medication adherence by continued use of pill box o Take medications as prescribed o Report any questions  or concerns to PharmD and/or provider(s)  Please see past updates related to this goal by clicking on the "Past Updates" button in the selected goal      .  Weight (lb) < 200 lb (90.7 kg) (pt-stated)      Wants to get 170 pounds      Depression Screen PHQ 2/9 Scores 06/19/2020 04/08/2020 08/27/2019 06/20/2019 10/12/2018 07/26/2018 01/12/2018  PHQ - 2 Score 0 0 0 0 0 0 0  PHQ- 9 Score - - - 0 - 0 -  Not completed - patient was talking about the history of her family - - - - -    Fall Risk Fall Risk  06/19/2020 08/27/2019 06/20/2019 10/14/2018 10/12/2018  Falls in the past year? 1 0 $R'1 1 1  'eC$ Comment passed out - missed a chair - -  Number falls in past yr: 0 0 0 0 0  Comment - - - states she fell while trying to sit in chair two weeks ago. Hurt R shoulder. Did not seek medical attention. -  Injury with Fall? 1 0 0 1 0  Comment laceration on head - - she hurt R shoulder. Sx have since resolved. Did not seek medical attention -  Risk for fall due to : Medication side effect - History of fall(s);Medication side effect History of fall(s) -  Follow up Falls evaluation completed;Education provided;Falls prevention discussed - Falls evaluation completed;Education provided;Falls prevention discussed Falls evaluation completed;Education provided;Falls prevention discussed -    FALL RISK PREVENTION PERTAINING TO THE HOME:  Any stairs in or around the home? Yes  If so, are there any without handrails? No  Home free of loose throw rugs in walkways, pet beds, electrical cords, etc? Yes  Adequate  lighting in your home to reduce risk of falls? Yes   ASSISTIVE DEVICES UTILIZED TO PREVENT FALLS:  Life alert? No  Use of a cane, walker or w/c? No  Grab bars in the bathroom? No  Shower chair or bench in shower? No  Elevated toilet seat or a handicapped toilet? No   TIMED UP AND GO:  Was the test performed? No .   Gait steady and fast without use of assistive device  Cognitive Function:     6CIT Screen  06/19/2020 06/20/2019 07/26/2018  What Year? 0 points 0 points 0 points  What month? 0 points 0 points 0 points  What time? 0 points 0 points 0 points  Count back from 20 0 points 0 points 0 points  Months in reverse 0 points 0 points 0 points  Repeat phrase 0 points 0 points 0 points  Total Score 0 0 0    Immunizations Immunization History  Administered Date(s) Administered  . Fluad Quad(high Dose 65+) 01/01/2019, 12/27/2019  . Influenza, High Dose Seasonal PF 01/12/2018, 01/01/2019  . PFIZER(Purple Top)SARS-COV-2 Vaccination 04/26/2019, 05/18/2019, 03/22/2020  . Pneumococcal Conjugate-13 10/13/2018  . Pneumococcal Polysaccharide-23 03/02/2017  . Tdap 09/18/2015  . Zoster 11/04/2015  . Zoster Recombinat (Shingrix) 11/22/2018, 02/25/2019    TDAP status: Up to date  Flu Vaccine status: Up to date  Pneumococcal vaccine status: Up to date  Covid-19 vaccine status: Completed vaccines  Qualifies for Shingles Vaccine? Yes   Zostavax completed Yes   Shingrix Completed?: Yes  Screening Tests Health Maintenance  Topic Date Due  . HEMOGLOBIN A1C  10/13/2020  . FOOT EXAM  10/22/2020  . OPHTHALMOLOGY EXAM  06/17/2021  . MAMMOGRAM  01/15/2022  . COLONOSCOPY (Pts 45-21yrs Insurance coverage will need to be confirmed)  03/17/2025  . TETANUS/TDAP  09/17/2025  . INFLUENZA VACCINE  Completed  . DEXA SCAN  Completed  . COVID-19 Vaccine  Completed  . Hepatitis C Screening  Completed  . PNA vac Low Risk Adult  Completed  . HPV VACCINES  Aged Out    Health Maintenance  There are no preventive care reminders to display for this patient.  Colorectal cancer screening: Type of screening: Colonoscopy. Completed 03/17/2020. Repeat every 5 years  Mammogram status: Completed 01/16/2020. Repeat every year  Bone Density status: Completed 01/15/2019.   Lung Cancer Screening: (Low Dose CT Chest recommended if Age 69-80 years, 30 pack-year currently smoking OR have quit w/in 15years.) does  not qualify.   Lung Cancer Screening Referral: no  Additional Screening:  Hepatitis C Screening: does qualify; Completed 08/11/2012  Vision Screening: Recommended annual ophthalmology exams for early detection of glaucoma and other disorders of the eye. Is the patient up to date with their annual eye exam?  Yes  Who is the provider or what is the name of the office in which the patient attends annual eye exams? Dr. Katy Fitch If pt is not established with a provider, would they like to be referred to a provider to establish care? No .   Dental Screening: Recommended annual dental exams for proper oral hygiene  Community Resource Referral / Chronic Care Management: CRR required this visit?  No   CCM required this visit?  No      Plan:     I have personally reviewed and noted the following in the patient's chart:   . Medical and social history . Use of alcohol, tobacco or illicit drugs  . Current medications and supplements . Functional ability and status .  Nutritional status . Physical activity . Advanced directives . List of other physicians . Hospitalizations, surgeries, and ER visits in previous 12 months . Vitals . Screenings to include cognitive, depression, and falls . Referrals and appointments  In addition, I have reviewed and discussed with patient certain preventive protocols, quality metrics, and best practice recommendations. A written personalized care plan for preventive services as well as general preventive health recommendations were provided to patient.     Kellie Simmering, LPN   [id number]   Nurse Notes:

## 2020-06-19 NOTE — Patient Instructions (Signed)
April Larson , Thank you for taking time to come for your Medicare Wellness Visit. I appreciate your ongoing commitment to your health goals. Please review the following plan we discussed and let me know if I can assist you in the future.   Screening recommendations/referrals: Colonoscopy: completed 03/17/2020, due 03/17/2025 Mammogram: compleetd 01/16/2020 Bone Density: completed 10/122/2020 Recommended yearly ophthalmology/optometry visit for glaucoma screening and checkup Recommended yearly dental visit for hygiene and checkup  Vaccinations: Influenza vaccine: completed 12/27/2019, due 11/03/2020 Pneumococcal vaccine: completed 10/13/2018 Tdap vaccine: completed 09/18/2015, due 09/17/2025 Shingles vaccine: completed   Covid-19: 03/22/2020, 05/18/2019, 04/26/2019  Advanced directives: Advance directive discussed with you today. I have provided a copy for you to complete at home and have notarized. Once this is complete please bring a copy in to our office so we can scan it into your chart.  Conditions/risks identified: none  Next appointment: Follow up in one year for your annual wellness visit    Preventive Care 65 Years and Older, Female Preventive care refers to lifestyle choices and visits with your health care provider that can promote health and wellness. What does preventive care include?  A yearly physical exam. This is also called an annual well check.  Dental exams once or twice a year.  Routine eye exams. Ask your health care provider how often you should have your eyes checked.  Personal lifestyle choices, including:  Daily care of your teeth and gums.  Regular physical activity.  Eating a healthy diet.  Avoiding tobacco and drug use.  Limiting alcohol use.  Practicing safe sex.  Taking low-dose aspirin every day.  Taking vitamin and mineral supplements as recommended by your health care provider. What happens during an annual well check? The services  and screenings done by your health care provider during your annual well check will depend on your age, overall health, lifestyle risk factors, and family history of disease. Counseling  Your health care provider may ask you questions about your:  Alcohol use.  Tobacco use.  Drug use.  Emotional well-being.  Home and relationship well-being.  Sexual activity.  Eating habits.  History of falls.  Memory and ability to understand (cognition).  Work and work Statistician.  Reproductive health. Screening  You may have the following tests or measurements:  Height, weight, and BMI.  Blood pressure.  Lipid and cholesterol levels. These may be checked every 5 years, or more frequently if you are over 1 years old.  Skin check.  Lung cancer screening. You may have this screening every year starting at age 40 if you have a 30-pack-year history of smoking and currently smoke or have quit within the past 15 years.  Fecal occult blood test (FOBT) of the stool. You may have this test every year starting at age 25.  Flexible sigmoidoscopy or colonoscopy. You may have a sigmoidoscopy every 5 years or a colonoscopy every 10 years starting at age 28.  Hepatitis C blood test.  Hepatitis B blood test.  Sexually transmitted disease (STD) testing.  Diabetes screening. This is done by checking your blood sugar (glucose) after you have not eaten for a while (fasting). You may have this done every 1-3 years.  Bone density scan. This is done to screen for osteoporosis. You may have this done starting at age 66.  Mammogram. This may be done every 1-2 years. Talk to your health care provider about how often you should have regular mammograms. Talk with your health care provider about your test  results, treatment options, and if necessary, the need for more tests. Vaccines  Your health care provider may recommend certain vaccines, such as:  Influenza vaccine. This is recommended every  year.  Tetanus, diphtheria, and acellular pertussis (Tdap, Td) vaccine. You may need a Td booster every 10 years.  Zoster vaccine. You may need this after age 68.  Pneumococcal 13-valent conjugate (PCV13) vaccine. One dose is recommended after age 72.  Pneumococcal polysaccharide (PPSV23) vaccine. One dose is recommended after age 59. Talk to your health care provider about which screenings and vaccines you need and how often you need them. This information is not intended to replace advice given to you by your health care provider. Make sure you discuss any questions you have with your health care provider. Document Released: 04/18/2015 Document Revised: 12/10/2015 Document Reviewed: 01/21/2015 Elsevier Interactive Patient Education  2017 [employer] Prevention in the Home Falls can cause injuries. They can happen to people of all ages. There are many things you can do to make your home safe and to help prevent falls. What can I do on the outside of my home?  Regularly fix the edges of walkways and driveways and fix any cracks.  Remove anything that might make you trip as you walk through a door, such as a raised step or threshold.  Trim any bushes or trees on the path to your home.  Use bright outdoor lighting.  Clear any walking paths of anything that might make someone trip, such as rocks or tools.  Regularly check to see if handrails are loose or broken. Make sure that both sides of any steps have handrails.  Any raised decks and porches should have guardrails on the edges.  Have any leaves, snow, or ice cleared regularly.  Use sand or salt on walking paths during winter.  Clean up any spills in your garage right away. This includes oil or grease spills. What can I do in the bathroom?  Use night lights.  Install grab bars by the toilet and in the tub and shower. Do not use towel bars as grab bars.  Use non-skid mats or decals in the tub or shower.  If you  need to sit down in the shower, use a plastic, non-slip stool.  Keep the floor dry. Clean up any water that spills on the floor as soon as it happens.  Remove soap buildup in the tub or shower regularly.  Attach bath mats securely with double-sided non-slip rug tape.  Do not have throw rugs and other things on the floor that can make you trip. What can I do in the bedroom?  Use night lights.  Make sure that you have a light by your bed that is easy to reach.  Do not use any sheets or blankets that are too big for your bed. They should not hang down onto the floor.  Have a firm chair that has side arms. You can use this for support while you get dressed.  Do not have throw rugs and other things on the floor that can make you trip. What can I do in the kitchen?  Clean up any spills right away.  Avoid walking on wet floors.  Keep items that you use a lot in easy-to-reach places.  If you need to reach something above you, use a strong step stool that has a grab bar.  Keep electrical cords out of the way.  Do not use floor polish or wax that makes  floors slippery. If you must use wax, use non-skid floor wax.  Do not have throw rugs and other things on the floor that can make you trip. What can I do with my stairs?  Do not leave any items on the stairs.  Make sure that there are handrails on both sides of the stairs and use them. Fix handrails that are broken or loose. Make sure that handrails are as long as the stairways.  Check any carpeting to make sure that it is firmly attached to the stairs. Fix any carpet that is loose or worn.  Avoid having throw rugs at the top or bottom of the stairs. If you do have throw rugs, attach them to the floor with carpet tape.  Make sure that you have a light switch at the top of the stairs and the bottom of the stairs. If you do not have them, ask someone to add them for you. What else can I do to help prevent falls?  Wear shoes  that:  Do not have high heels.  Have rubber bottoms.  Are comfortable and fit you well.  Are closed at the toe. Do not wear sandals.  If you use a stepladder:  Make sure that it is fully opened. Do not climb a closed stepladder.  Make sure that both sides of the stepladder are locked into place.  Ask someone to hold it for you, if possible.  Clearly mark and make sure that you can see:  Any grab bars or handrails.  First and last steps.  Where the edge of each step is.  Use tools that help you move around (mobility aids) if they are needed. These include:  Canes.  Walkers.  Scooters.  Crutches.  Turn on the lights when you go into a dark area. Replace any light bulbs as soon as they burn out.  Set up your furniture so you have a clear path. Avoid moving your furniture around.  If any of your floors are uneven, fix them.  If there are any pets around you, be aware of where they are.  Review your medicines with your doctor. Some medicines can make you feel dizzy. This can increase your chance of falling. Ask your doctor what other things that you can do to help prevent falls. This information is not intended to replace advice given to you by your health care provider. Make sure you discuss any questions you have with your health care provider. Document Released: 01/16/2009 Document Revised: 08/28/2015 Document Reviewed: 04/26/2014 Elsevier Interactive Patient Education  2017 [employer].

## 2020-06-19 NOTE — Patient Instructions (Signed)
Diabetes Mellitus and Foot Care Foot care is an important part of your health, especially when you have diabetes. Diabetes may cause you to have problems because of poor blood flow (circulation) to your feet and legs, which can cause your skin to:  Become thinner and drier.  Break more easily.  Heal more slowly.  Peel and crack. You may also have nerve damage (neuropathy) in your legs and feet, causing decreased feeling in them. This means that you may not notice minor injuries to your feet that could lead to more serious problems. Noticing and addressing any potential problems early is the best way to prevent future foot problems. How to care for your feet Foot hygiene  Wash your feet daily with warm water and mild soap. Do not use hot water. Then, pat your feet and the areas between your toes until they are completely dry. Do not soak your feet as this can dry your skin.  Trim your toenails straight across. Do not dig under them or around the cuticle. File the edges of your nails with an emery board or nail file.  Apply a moisturizing lotion or petroleum jelly to the skin on your feet and to dry, brittle toenails. Use lotion that does not contain alcohol and is unscented. Do not apply lotion between your toes.   Shoes and socks  Wear clean socks or stockings every day. Make sure they are not too tight. Do not wear knee-high stockings since they may decrease blood flow to your legs.  Wear shoes that fit properly and have enough cushioning. Always look in your shoes before you put them on to be sure there are no objects inside.  To break in new shoes, wear them for just a few hours a day. This prevents injuries on your feet. Wounds, scrapes, corns, and calluses  Check your feet daily for blisters, cuts, bruises, sores, and redness. If you cannot see the bottom of your feet, use a mirror or ask someone for help.  Do not cut corns or calluses or try to remove them with medicine.  If you  find a minor scrape, cut, or break in the skin on your feet, keep it and the skin around it clean and dry. You may clean these areas with mild soap and water. Do not clean the area with peroxide, alcohol, or iodine.  If you have a wound, scrape, corn, or callus on your foot, look at it several times a day to make sure it is healing and not infected. Check for: ? Redness, swelling, or pain. ? Fluid or blood. ? Warmth. ? Pus or a bad smell.   General tips  Do not cross your legs. This may decrease blood flow to your feet.  Do not use heating pads or hot water bottles on your feet. They may burn your skin. If you have lost feeling in your feet or legs, you may not know this is happening until it is too late.  Protect your feet from hot and cold by wearing shoes, such as at the beach or on hot pavement.  Schedule a complete foot exam at least once a year (annually) or more often if you have foot problems. Report any cuts, sores, or bruises to your health care provider immediately. Where to find more information  American Diabetes Association: www.diabetes.org  Association of Diabetes Care & Education Specialists: www.diabeteseducator.org Contact a health care provider if:  You have a medical condition that increases your risk of infection and   you have any cuts, sores, or bruises on your feet.  You have an injury that is not healing.  You have redness on your legs or feet.  You feel burning or tingling in your legs or feet.  You have pain or cramps in your legs and feet.  Your legs or feet are numb.  Your feet always feel cold.  You have pain around any toenails. Get help right away if:  You have a wound, scrape, corn, or callus on your foot and: ? You have pain, swelling, or redness that gets worse. ? You have fluid or blood coming from the wound, scrape, corn, or callus. ? Your wound, scrape, corn, or callus feels warm to the touch. ? You have pus or a bad smell coming from  the wound, scrape, corn, or callus. ? You have a fever. ? You have a red line going up your leg. Summary  Check your feet every day for blisters, cuts, bruises, sores, and redness.  Apply a moisturizing lotion or petroleum jelly to the skin on your feet and to dry, brittle toenails.  Wear shoes that fit properly and have enough cushioning.  If you have foot problems, report any cuts, sores, or bruises to your health care provider immediately.  Schedule a complete foot exam at least once a year (annually) or more often if you have foot problems. This information is not intended to replace advice given to you by your health care provider. Make sure you discuss any questions you have with your health care provider. Document Revised: 10/11/2019 Document Reviewed: 10/11/2019 Elsevier Patient Education  2021 Elsevier Inc.  

## 2020-06-19 NOTE — Progress Notes (Signed)
I,[name],acting as a Education administrator for Maximino Greenland, MD.,have documented all relevant documentation on the behalf of Maximino Greenland, MD,as directed by  Maximino Greenland, MD while in the presence of Maximino Greenland, MD.  This visit occurred during the SARS-CoV-2 public health emergency.  Safety protocols were in place, including screening questions prior to the visit, additional usage of staff PPE, and extensive cleaning of exam room while observing appropriate contact time as indicated for disinfecting solutions.  Subjective:     Patient ID: April Larson , female    DOB: 1950-11-16 , 70 y.o.   MRN: [id number]   Chief Complaint  Patient presents with  . Hypertension    HPI  The patient is here today for blood pressure f/u and f/u of her "episodic confusion". She has not had any further episodes. Workup included labwork and MRI, negative for acute changes. She admits she is now drinking more water, eating more fruit/veggies,  and drinking Boost to get more protein. She is now more focused on her health. She reports compliance with BP meds.  She denies headaches, chest pain and shortness of breath.   Hypertension This is a chronic problem. The current episode started more than 1 year ago. The problem has been gradually improving since onset. Condition status: FAIR CONTROL. Pertinent negatives include no anxiety, blurred vision, chest pain, palpitations or shortness of breath. Risk factors for coronary artery disease include post-menopausal state, diabetes mellitus, dyslipidemia and sedentary lifestyle. The current treatment provides moderate improvement. Hypertensive end-organ damage includes kidney disease.     Past Medical History:  Diagnosis Date  . Diabetes mellitus without complication ([place])   . Hypertension   . Substance abuse ([place])      Family History  Problem Relation Age of Onset  . Cancer Mother   . Diabetes Father   . Cancer Brother   . Breast cancer  Maternal Aunt      Current Outpatient Medications:  .  ACCU-CHEK FASTCLIX LANCETS MISC, USE TO CHECK BLOOD SUGARS TWICE DAILY, Disp: 100 each, Rfl: 11 .  ACCU-CHEK GUIDE test strip, USE TO CHECK BLOOD SUGARS TWICE DAILY AS DIRECTED, Disp: 100 strip, Rfl: 2 .  Ascorbic Acid (VITAMIN C PO), Take 1 tablet by mouth daily., Disp: , Rfl:  .  aspirin EC 81 MG tablet, Take 81 mg by mouth daily., Disp: , Rfl:  .  B-D ULTRAFINE III SHORT PEN 31G X 8 MM MISC, USE AS DIRECTED TWICE DAILY, Disp: 100 each, Rfl: 11 .  Biotin 10 MG CAPS, Take 1 tablet by mouth daily., Disp: , Rfl:  .  Blood Glucose Monitoring Suppl (ACCU-CHEK GUIDE) w/Device KIT, USE TO CHECK BLOOD SUGARS TWICE DAILY AS DIRECTED dx: e11.22, Disp: 1 kit, Rfl: 1 .  calcium carbonate (OSCAL) 1500 (600 Ca) MG TABS tablet, Take 600 mg of elemental calcium by mouth daily with breakfast., Disp: , Rfl:  .  Cholecalciferol 125 MCG (5000 UT) TABS, Take 5,000 Units by mouth daily. , Disp: , Rfl:  .  diclofenac Sodium (VOLTAREN) 1 % GEL, Apply 2 g topically 4 (four) times daily., Disp: 100 g, Rfl: 2 .  fluticasone (FLONASE) 50 MCG/ACT nasal spray, SHAKE LIQUID AND USE 2 SPRAYS IN EACH NOSTRIL DAILY, Disp: 16 g, Rfl: 2 .  LIVALO 4 MG TABS, TAKE 1 TABLET BY MOUTH DAILY, Disp: 90 tablet, Rfl: 1 .  loratadine (CLARITIN) 10 MG tablet, Take 10 mg by mouth daily. AM, Disp: , Rfl:  .  metFORMIN (GLUCOPHAGE) 500 MG tablet, Take 1 tablet (500 mg total) by mouth daily with breakfast. Dinner, Disp: 180 tablet, Rfl: 3 .  Multiple Vitamins-Minerals (ONE-A-DAY WOMENS 50+ ADVANTAGE PO), Take 1 tablet by mouth daily., Disp: , Rfl:  .  NON FORMULARY, 2 each. Calm, Disp: , Rfl:  .  omeprazole (PRILOSEC) 40 MG capsule, TAKE 1 CAPSULE BY MOUTH EVERY DAY BEFORE A MEAL, Disp: 90 capsule, Rfl: 1 .  telmisartan-hydrochlorothiazide (MICARDIS HCT) 40-12.5 MG tablet, TAKE 1 TABLET BY MOUTH DAILY, Disp: 90 tablet, Rfl: 1 .  Turmeric 500 MG CAPS, Take 1 capsule by mouth daily. ,  Disp: , Rfl:  .  XULTOPHY 100-3.6 UNIT-MG/ML SOPN, INJECT 25 UNITS INTO THE SKIN DAILY, Disp: 27 mL, Rfl: 3   No Known Allergies   Review of Systems  Constitutional: Negative.   Eyes: Negative for blurred vision.  Respiratory: Negative.  Negative for shortness of breath.   Cardiovascular: Negative.  Negative for chest pain and palpitations.  Gastrointestinal: Negative.   Psychiatric/Behavioral: Negative.   All other systems reviewed and are negative.    Today's Vitals   06/19/20 1120  BP: 130/62  Pulse: 64  Temp: 97.7 F (36.5 C)  TempSrc: Oral  Weight: 171 lb 3.2 oz (77.7 kg)  Height: 5\' 4"  (1.626 m)  PainSc: 0-No pain   Body mass index is 29.39 kg/m.  Wt Readings from Last 3 Encounters:  06/19/20 171 lb 3.2 oz (77.7 kg)  06/19/20 171 lb 3.2 oz (77.7 kg)  04/15/20 173 lb (78.5 kg)   BP Readings from Last 3 Encounters:  06/19/20 130/62  06/19/20 130/62  04/03/20 124/82    Objective:  Physical Exam Vitals and nursing note reviewed.  Constitutional:      Appearance: Normal appearance.  HENT:     Head: Normocephalic and atraumatic.     Nose:     Comments: Masked     Mouth/Throat:     Comments: Masked  Cardiovascular:     Rate and Rhythm: Normal rate and regular rhythm.     Heart sounds: Normal heart sounds.  Pulmonary:     Effort: Pulmonary effort is normal.     Breath sounds: Normal breath sounds.  Musculoskeletal:     Cervical back: Normal range of motion.  Skin:    General: Skin is warm.  Neurological:     General: No focal deficit present.     Mental Status: She is alert and oriented to person, place, and time.         Assessment And Plan:     1. Episode of confusion Comments: Appears to have resolved, likely related to dehydration. She will let me know if she has any repeat episodes. She has upcoming Neuro eval as well. She would like to be evaluated for her memory w/ Neuro as well.   2. Hypertensive nephropathy Comments: Controlled. She is  encouraged to incorporate more exercise into her daily routine. Advised to avoid adding salt to her foods. She will continue with telmisartan/hct 40/12.5mg  daily for now.   3. Chronic renal disease, stage II Comments: Chronic, previous GFR reviewed. I will recheck at her next visit. Advised to stay well hydrated.   4. Overweight with body mass index (BMI) of 29 to 29.9 in adult Comments: Her BMI is acceptable for her demographic. Advised to aim for at least 150 minutes of exercise per week.  Patient was given opportunity to ask questions. Patient verbalized understanding of the plan and was able to repeat  key elements of the plan. All questions were answered to their satisfaction.   I, Maximino Greenland, MD, have reviewed all documentation for this visit. The documentation on 06/19/20 for the exam, diagnosis, procedures, and orders are all accurate and complete.   IF YOU HAVE BEEN REFERRED TO A SPECIALIST, IT MAY TAKE 1-2 WEEKS TO SCHEDULE/PROCESS THE REFERRAL. IF YOU HAVE NOT HEARD FROM US/SPECIALIST IN TWO WEEKS, PLEASE GIVE [place] A CALL AT [phone].   THE PATIENT IS ENCOURAGED TO PRACTICE SOCIAL DISTANCING DUE TO THE COVID-19 PANDEMIC.

## 2020-06-23 ENCOUNTER — Telehealth: Payer: Medicare Other

## 2020-06-23 ENCOUNTER — Ambulatory Visit (INDEPENDENT_AMBULATORY_CARE_PROVIDER_SITE_OTHER): Payer: Medicare Other

## 2020-06-23 DIAGNOSIS — N182 Chronic kidney disease, stage 2 (mild): Secondary | ICD-10-CM

## 2020-06-23 DIAGNOSIS — E1122 Type 2 diabetes mellitus with diabetic chronic kidney disease: Secondary | ICD-10-CM | POA: Diagnosis not present

## 2020-06-23 DIAGNOSIS — I129 Hypertensive chronic kidney disease with stage 1 through stage 4 chronic kidney disease, or unspecified chronic kidney disease: Secondary | ICD-10-CM | POA: Diagnosis not present

## 2020-06-30 NOTE — Patient Instructions (Signed)
Goals Addressed      Other   .  Monitor and Manage My Blood Sugar-Diabetes Type 2   On track     Timeframe:  Long-Range Goal Priority:  Medium Start Date: 06/23/20                         Expected End Date:  12/24/20                     Follow Up Date: 08/06/20     - check blood sugar at prescribed times - check blood sugar before and after exercise - check blood sugar if I feel it is too high or too low - enter blood sugar readings and medication or insulin into daily log - take the blood sugar log to all doctor visits - take the blood sugar meter to all doctor visits    Why is this important?    Checking your blood sugar at home helps to keep it from getting very high or very low.   Writing the results in a diary or log helps the doctor know how to care for you.   Your blood sugar log should have the time, date and the results.   Also, write down the amount of insulin or other medicine that you take.   Other information, like what you ate, exercise done and how you were feeling, will also be helpful.     Notes:     .  Obtain Eye Exam-Diabetes Type 2   On track     Timeframe:  Long-Range Goal Priority:  High Start Date:  06/23/20                          Expected End Date:  12/24/20                     Follow Up Date: 08/06/20    - keep appointment with eye doctor - schedule appointment with eye doctor    Why is this important?    Eye check-ups are important when you have diabetes.   Vision loss can be prevented.    Notes:     .  Perform Foot Care-Diabetes Type 2   On track     Timeframe:  Long-Range Goal Priority:  Medium Start Date:  06/23/20                           Expected End Date:  12/24/20                     Follow Up Date: 08/06/20    - check feet daily for cuts, sores or redness - keep feet up while sitting - trim toenails straight across - wash and dry feet carefully every day - wear comfortable, cotton socks - wear comfortable, well-fitting shoes     Why is this important?    Good foot care is very important when you have diabetes.   There are many things you can do to keep your feet healthy and catch a problem early.    Notes:     .  Track and Manage My Blood Pressure-Hypertension   On track     Timeframe:  Long-Range Goal Priority:  Medium Start Date:  06/23/20  Expected End Date:  12/24/20                   Follow Up Date: 08/06/20    - check blood pressure daily - choose a place to take my blood pressure (home, clinic or office, retail store) - write blood pressure results in a log or diary    Why is this important?    You won't feel high blood pressure, but it can still hurt your blood vessels.   High blood pressure can cause heart or kidney problems. It can also cause a stroke.   Making lifestyle changes like losing a [place]ji Kok weight or eating less salt will help.   Checking your blood pressure at home and at different times of the day can help to control blood pressure.   If the doctor prescribes medicine remember to take it the way the doctor ordered.   Call the office if you cannot afford the medicine or if there are questions about it.     Notes:

## 2020-06-30 NOTE — Chronic Care Management (AMB) (Signed)
Chronic Care Management   CCM RN Visit Note  06/23/2020 Name: April Larson MRN: [id number] DOB: 04/20/1950  Subjective: April Larson is a 70 y.o. year old female who is a primary care patient of April Chard, MD. The care management team was consulted for assistance with disease management and care coordination needs.    Engaged with patient by telephone for follow up visit in response to provider referral for case management and/or care coordination services.   Consent to Services:  The patient was given information about Chronic Care Management services, agreed to services, and gave verbal consent prior to initiation of services.  Please see initial visit note for detailed documentation.   Patient agreed to services and verbal consent obtained.   Assessment: Review of patient past medical history, allergies, medications, health status, including review of consultants reports, laboratory and other test data, was performed as part of comprehensive evaluation and provision of chronic care management services.   SDOH (Social Determinants of Health) assessments and interventions performed:    CCM Care Plan  No Known Allergies  Outpatient Encounter Medications as of 06/23/2020  Medication Sig  . ACCU-CHEK FASTCLIX LANCETS MISC USE TO CHECK BLOOD SUGARS TWICE DAILY  . ACCU-CHEK GUIDE test strip USE TO CHECK BLOOD SUGARS TWICE DAILY AS DIRECTED  . Ascorbic Acid (VITAMIN C PO) Take 1 tablet by mouth daily.  Marland Kitchen aspirin EC 81 MG tablet Take 81 mg by mouth daily.  . B-D ULTRAFINE III SHORT PEN 31G X 8 MM MISC USE AS DIRECTED TWICE DAILY  . Biotin 10 MG CAPS Take 1 tablet by mouth daily.  . Blood Glucose Monitoring Suppl (ACCU-CHEK GUIDE) w/Device KIT USE TO CHECK BLOOD SUGARS TWICE DAILY AS DIRECTED dx: e11.22  . calcium carbonate (OSCAL) 1500 (600 Ca) MG TABS tablet Take 600 mg of elemental calcium by mouth daily with breakfast.  . Cholecalciferol 125 MCG  (5000 UT) TABS Take 5,000 Units by mouth daily.   . diclofenac Sodium (VOLTAREN) 1 % GEL Apply 2 g topically 4 (four) times daily.  . fluticasone (FLONASE) 50 MCG/ACT nasal spray SHAKE LIQUID AND USE 2 SPRAYS IN EACH NOSTRIL DAILY  . LIVALO 4 MG TABS TAKE 1 TABLET BY MOUTH DAILY  . loratadine (CLARITIN) 10 MG tablet Take 10 mg by mouth daily. AM  . metFORMIN (GLUCOPHAGE) 500 MG tablet Take 1 tablet (500 mg total) by mouth daily with breakfast. Dinner  . Multiple Vitamins-Minerals (ONE-A-DAY WOMENS 50+ ADVANTAGE PO) Take 1 tablet by mouth daily.  . NON FORMULARY 2 each. Calm  . omeprazole (PRILOSEC) 40 MG capsule TAKE 1 CAPSULE BY MOUTH EVERY DAY BEFORE A MEAL  . telmisartan-hydrochlorothiazide (MICARDIS HCT) 40-12.5 MG tablet TAKE 1 TABLET BY MOUTH DAILY  . Turmeric 500 MG CAPS Take 1 capsule by mouth daily.   April Larson 100-3.6 UNIT-MG/ML SOPN INJECT 25 UNITS INTO THE SKIN DAILY   No facility-administered encounter medications on file as of 06/23/2020.    Patient Active Problem List   Diagnosis Date Noted  . 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  . Pure hypercholesterolemia 01/23/2020  . Vitamin D deficiency 10/23/2019  . Right hip pain 05/15/2018  . Estrogen deficiency 05/15/2018  . Class 1 obesity due to excess calories with serious comorbidity and body mass index (BMI) of 31.0 to 31.9 in adult 05/15/2018  . Diabetes mellitus with stage 2 chronic kidney disease ([place]) 01/11/2018  . Benign hypertension with chronic kidney disease, stage II 01/11/2018    Conditions to be  addressed/monitored:DM II with stage 2 CKD, Hypertensive Nephropathy  Care Plan : Diabetes Type 2 (Adult)  Updates made by April Logan, RN since 06/30/2020 12:00 AM    Problem: Glycemic Management (Diabetes, Type 2)   Priority: Medium    Long-Range Goal: Glycemic Management Optimized   Start Date: 06/23/2020  Expected End Date: 12/24/2020  This Visit's Progress: On track  Priority: Medium  Note:   Objective:   Lab Results  Component Value Date   HGBA1C 7.1 (H) 04/15/2020 .   Lab Results  Component Value Date   CREATININE 0.91 04/15/2020   CREATININE 1.03 (H) 03/27/2020   CREATININE 1.14 (H) 01/23/2020 .   Marland Kitchen No results found for: EGFR Current Barriers:  Marland Kitchen Knowledge Deficits related to basic Diabetes pathophysiology and self care/management . Knowledge Deficits related to medications used for management of diabetes Case Manager Clinical Goal(s):  . patient will demonstrate improved adherence to prescribed treatment plan for diabetes self care/management as evidenced by: daily monitoring and recording of CBG  adherence to ADA/ carb modified diet exercise 5 days/week adherence to prescribed medication regimen contacting provider for new or worsened symptoms or questions Interventions:  . Collaboration with April Chard, MD regarding development and update of comprehensive plan of care as evidenced by provider attestation and co-signature . Inter-disciplinary care team collaboration (see longitudinal plan of care) . Provided education to patient about basic DM disease process . Review of patient status, including review of consultants reports, relevant laboratory and other test results, and medications completed. . Reviewed medications with patient and discussed importance of medication adherence . Provided patient with written educational materials related to hypo and hyperglycemia and importance of correct treatment . Reviewed scheduled/upcoming provider appointments including: PCP follow up scheduled for 08/04/20 _0 :30 PM  . Advised patient, providing education and rationale, to check cbg before meals and at bedtime and record, calling the CCM team and or PCP for findings outside established parameters.   . Discussed plans with patient for ongoing care management follow up and provided patient with direct contact information for care management team Self-Care Activities - Self administers oral  medications as prescribed Attends all scheduled provider appointments Checks blood sugars as prescribed and utilize hyper and hypoglycemia protocol as needed Adheres to prescribed ADA/carb modified Patient Goals: - check blood sugar at prescribed times - check blood sugar before and after exercise - check blood sugar if I feel it is too high or too low - enter blood sugar readings and medication or insulin into daily log - take the blood sugar log to all doctor visits - take the blood sugar meter to all doctor visits  Follow Up Plan: Telephone follow up appointment with care management team member scheduled for: 08/06/20   Care Plan : Hypertension (Adult)  Updates made by April Logan, RN since 06/30/2020 12:00 AM    Problem: Hypertension (Hypertension)   Priority: Medium    Long-Range Goal: Hypertension Monitored   Start Date: 06/23/2020  Expected End Date: 12/24/2020  This Visit's Progress: On track  Priority: Medium  Note:   Objective:  . Last practice recorded BP readings:  BP Readings from Last 3 Encounters:  06/19/20 130/62  06/19/20 130/62  04/03/20 124/82 .   Marland Kitchen Most recent eGFR/CrCl: No results found for: EGFR  No components found for: CRCL Current Barriers:  Marland Kitchen Knowledge Deficits related to basic understanding of hypertension pathophysiology and self care management . Knowledge Deficits related to understanding of medications prescribed for management of  hypertension Case Manager Clinical Goal(s):  . patient will demonstrate improved adherence to prescribed treatment plan for hypertension as evidenced by taking all medications as prescribed, monitoring and recording blood pressure as directed, adhering to low sodium/DASH diet Interventions:  . Collaboration with April Chard, MD regarding development and update of comprehensive plan of care as evidenced by provider attestation and co-signature . Inter-disciplinary care team collaboration (see longitudinal plan of  care) . Evaluation of current treatment plan related to hypertension self management and patient's adherence to plan as established by provider. . Provided education to patient re: stroke prevention, s/s of heart attack and stroke, DASH diet, complications of uncontrolled blood pressure . Reviewed medications with patient and discussed importance of compliance . Advised patient, providing education and rationale, to monitor blood pressure daily and record, calling PCP for findings outside established parameters.  . Reviewed scheduled/upcoming provider appointments including: PCP follow up scheduled for 08/04/20 _0 :30 PM  . Discussed plans with patient for ongoing care management follow up and provided patient with direct contact information for care management team Self-Care Activities: - Self administers medications as prescribed Attends all scheduled provider appointments Calls provider office for new concerns, questions, or BP outside discussed parameters Checks BP and records as discussed Follows a low sodium diet/DASH diet Patient Goals: - check blood pressure daily - write blood pressure results in a log or diary  Follow Up Plan: Telephone follow up appointment with care management team member scheduled for: 08/06/20    Plan:Telephone follow up appointment with care management team member scheduled for:  08/06/20  Barb Merino, RN, BSN, CCM Care Management Coordinator [place] [facility]  Direct Phone: [phone]

## 2020-07-03 ENCOUNTER — Other Ambulatory Visit: Payer: Self-pay | Admitting: Internal Medicine

## 2020-07-07 ENCOUNTER — Other Ambulatory Visit: Payer: Self-pay | Admitting: Internal Medicine

## 2020-07-07 ENCOUNTER — Telehealth: Payer: Self-pay

## 2020-07-07 NOTE — Progress Notes (Signed)
Chronic Care Management Pharmacy Note  07/15/2020 Name:  April Larson MRN:  [id number] DOB:  1951/02/09  Subjective: April Larson is an 70 y.o. year old female who is a primary patient of Glendale Chard, MD.  The CCM team was consulted for assistance with disease management and care coordination needs.  Patient is scheduled to go to the neurologist on May 18th, 2022.   Engaged with patient by telephone for follow up visit in response to provider referral for pharmacy case management and/or care coordination services.   Consent to Services:  The patient was given information about Chronic Care Management services, agreed to services, and gave verbal consent prior to initiation of services.  Please see initial visit note for detailed documentation.   Patient Care Team: Glendale Chard, MD as PCP - General (Internal Medicine) Warden Fillers, MD as Consulting Physician (Ophthalmology)  Recent office visits: 06/19/2020 PCP OV   04/15/2020 PCP OV -Omeprazole to Monday, Wednesday and Friday dosing  Hospital visits: None in previous 6 months  ER Visit: 03/27/2020 - ER  Visit for syncope and collapse   Objective:  Lab Results  Component Value Date   CREATININE 0.91 04/15/2020   BUN 22 04/15/2020   GFRNONAA 65 04/15/2020   GFRAA 75 04/15/2020   NA 141 04/15/2020   K 4.4 04/15/2020   CALCIUM 10.0 04/15/2020   CO2 25 04/15/2020   GLUCOSE 90 04/15/2020    Lab Results  Component Value Date/Time   HGBA1C 7.1 (H) 04/15/2020 04:07 PM   HGBA1C 7.5 (H) 01/23/2020 03:17 PM   MICROALBUR 10 10/23/2019 03:42 PM   MICROALBUR 10 10/12/2018 04:36 PM    Last diabetic Eye exam:  Lab Results  Component Value Date/Time   HMDIABEYEEXA No Retinopathy 09/25/2019 12:00 AM    Last diabetic Foot exam: No results found for: HMDIABFOOTEX   Lab Results  Component Value Date   CHOL 186 10/23/2019   HDL 56 10/23/2019   LDLCALC 110 (H) 10/23/2019   TRIG 109  10/23/2019   CHOLHDL 3.3 10/23/2019    Hepatic Function Latest Ref Rng & Units 04/24/2020 04/15/2020 10/23/2019  Total Protein 6.0 - 8.5 g/dL - 7.6 7.9  Albumin 3.8 - 4.8 g/dL - 4.4 4.5  AST 0 - 40 IU/L - 16 12  ALT 0 - 32 IU/L - 11 12  Alk Phosphatase 44 - 121 IU/L 161(H) 196(H) 133(H)  Total Bilirubin 0.0 - 1.2 mg/dL - <0.2 0.3      CBC Latest Ref Rng & Units 04/15/2020 03/27/2020 06/20/2019  WBC 3.4 - 10.8 x10E3/uL 10.1 7.2 8.2  Hemoglobin 11.1 - 15.9 g/dL 12.4 11.7(L) 12.9  Hematocrit 34.0 - 46.6 % 37.7 37.0 40.3  Platelets 150 - 450 x10E3/uL 480(H) 324 385    Lab Results  Component Value Date/Time   VD25OH 56.1 10/23/2019 03:34 PM    Clinical ASCVD: No  The 10-year ASCVD risk score Mikey Bussing DC Jr., et al., 2013) is: 23.2%   Values used to calculate the score:     Age: 53 years     Sex: Female     Is Non-Hispanic African American: Yes     Diabetic: Yes     Tobacco smoker: No     Systolic Blood Pressure: 569 mmHg     Is BP treated: Yes     HDL Cholesterol: 56 mg/dL     Total Cholesterol: 186 mg/dL    Depression screen [facility] 2/9 06/19/2020 04/08/2020 08/27/2019  Decreased Interest 0  0 0  Down, Depressed, Hopeless 0 0 0  PHQ - 2 Score 0 0 0  Altered sleeping - - -  Tired, decreased energy - - -  Change in appetite - - -  Feeling bad or failure about yourself  - - -  Trouble concentrating - - -  Moving slowly or fidgety/restless - - -  Suicidal thoughts - - -  PHQ-9 Score - - -  Difficult doing work/chores - - -      Social History   Tobacco Use  Smoking Status Former Smoker  . Packs/day: 1.00  . Years: 30.00  . Pack years: 30.00  . Types: Cigarettes  . Quit date: 2004  . Years since quitting: 18.2  Smokeless Tobacco Never Used   BP Readings from Last 3 Encounters:  06/19/20 130/62  06/19/20 130/62  04/03/20 124/82   Pulse Readings from Last 3 Encounters:  06/19/20 64  06/19/20 84  04/03/20 82   Wt Readings from Last 3 Encounters:  06/19/20 171 lb 3.2  oz (77.7 kg)  06/19/20 171 lb 3.2 oz (77.7 kg)  04/15/20 173 lb (78.5 kg)   BMI Readings from Last 3 Encounters:  06/19/20 29.39 kg/m  06/19/20 29.39 kg/m  04/15/20 29.70 kg/m    Assessment/Interventions: Review of patient past medical history, allergies, medications, health status, including review of consultants reports, laboratory and other test data, was performed as part of comprehensive evaluation and provision of chronic care management services.   SDOH:  (Social Determinants of Health) assessments and interventions performed: No  SDOH Screenings   Alcohol Screen: Not on file  Depression (PHQ2-9): Low Risk   . PHQ-2 Score: 0  Financial Resource Strain: Low Risk   . Difficulty of Paying Living Expenses: Not hard at all  Food Insecurity: No Food Insecurity  . Worried About Charity fundraiser in the Last Year: Never true  . Ran Out of Food in the Last Year: Never true  Housing: Low Risk   . Last Housing Risk Score: 0  Physical Activity: Inactive  . Days of Exercise per Week: 0 days  . Minutes of Exercise per Session: 0 min  Social Connections: Not on file  Stress: No Stress Concern Present  . Feeling of Stress : Not at all  Tobacco Use: Medium Risk  . Smoking Tobacco Use: Former Smoker  . Smokeless Tobacco Use: Never Used  Transportation Needs: No Transportation Needs  . Lack of Transportation (Medical): No  . Lack of Transportation (Non-Medical): No    CCM Care Plan  No Known Allergies  Medications Reviewed Today    Reviewed by Lynne Logan, RN (Registered Nurse) on 06/23/20 at 53  Med List Status: <None>  Medication Order Taking? Sig Documenting Provider Last Dose Status Informant  ACCU-CHEK FASTCLIX LANCETS [place] [id number] No USE TO CHECK BLOOD SUGARS TWICE DAILY Glendale Chard, MD Taking Active   ACCU-CHEK GUIDE test strip [id number] No USE TO CHECK BLOOD SUGARS TWICE DAILY AS DIRECTED Glendale Chard, MD Taking Active   Ascorbic Acid (VITAMIN C PO)  [id number] No Take 1 tablet by mouth daily. [provider] Taking Active   aspirin EC 81 MG tablet [id number] No Take 81 mg by mouth daily. [provider] Taking Active   B-D ULTRAFINE III SHORT PEN 31G X 8 MM MISC [id number] No USE AS DIRECTED TWICE DAILY Glendale Chard, MD Taking Active   Biotin 10 MG CAPS [id number] No Take 1 tablet by mouth daily. [provider] Taking  Active   Blood Glucose Monitoring Suppl (ACCU-CHEK GUIDE) w/Device KIT [id number] No USE TO CHECK BLOOD SUGARS TWICE DAILY AS DIRECTED dx: e11.22 Glendale Chard, MD Taking Active   calcium carbonate (OSCAL) 1500 (600 Ca) MG TABS tablet [id number] No Take 600 mg of elemental calcium by mouth daily with breakfast. [provider] Taking Active Self  Cholecalciferol 125 MCG (5000 UT) TABS [id number] No Take 5,000 Units by mouth daily.  [provider] Taking Active   diclofenac Sodium (VOLTAREN) 1 % GEL [id number] No Apply 2 g topically 4 (four) times daily. Minette Brine, FNP Taking Active   fluticasone [facility]) 50 MCG/ACT nasal spray [id number] No SHAKE LIQUID AND USE 2 SPRAYS IN EACH NOSTRIL DAILY Glendale Chard, MD Taking Active   LIVALO 4 MG TABS [id number] No TAKE 1 TABLET BY MOUTH DAILY Glendale Chard, MD Taking Active   loratadine (CLARITIN) 10 MG tablet [id number] No Take 10 mg by mouth daily. AM [provider] Taking Active   metFORMIN (GLUCOPHAGE) 500 MG tablet [id number] No Take 1 tablet (500 mg total) by mouth daily with breakfast. Rayford Halsted, Bailey Mech, MD Taking Active   Multiple Vitamins-Minerals (ONE-A-DAY WOMENS 50+ ADVANTAGE PO) [id number] No Take 1 tablet by mouth daily. [provider] Taking Active   NON FORMULARY [id number] No 2 each. Calm [provider] Taking Active   omeprazole (PRILOSEC) 40 MG capsule [id number] No TAKE 1 CAPSULE BY MOUTH EVERY DAY BEFORE A MEAL Glendale Chard, MD Taking Active   telmisartan-hydrochlorothiazide (MICARDIS HCT) 40-12.5  MG tablet [id number] No TAKE 1 TABLET BY MOUTH DAILY Glendale Chard, MD Taking Active   Turmeric 500 MG CAPS [id number] No Take 1 capsule by mouth daily.  [provider] Taking Active   XULTOPHY 100-3.6 UNIT-MG/ML SOPN [id number] No INJECT 25 UNITS INTO THE SKIN DAILY Glendale Chard, MD Taking Active   Med List Note Glendale Chard, MD 10/12/18 1448): Change Xultophy to 22 units 10/12/2018          Patient Active Problem List   Diagnosis Date Noted  . 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  . Pure hypercholesterolemia 01/23/2020  . Vitamin D deficiency 10/23/2019  . Right hip pain 05/15/2018  . Estrogen deficiency 05/15/2018  . Class 1 obesity due to excess calories with serious comorbidity and body mass index (BMI) of 31.0 to 31.9 in adult 05/15/2018  . Diabetes mellitus with stage 2 chronic kidney disease ([place]) 01/11/2018  . Benign hypertension with chronic kidney disease, stage II 01/11/2018    Immunization History  Administered Date(s) Administered  . Fluad Quad(high Dose 65+) 01/01/2019, 12/27/2019  . Influenza, High Dose Seasonal PF 01/12/2018, 01/01/2019  . PFIZER(Purple Top)SARS-COV-2 Vaccination 04/26/2019, 05/18/2019, 03/22/2020  . Pneumococcal Conjugate-13 10/13/2018  . Pneumococcal Polysaccharide-23 03/02/2017  . Tdap 09/18/2015  . Zoster 11/04/2015  . Zoster Recombinat (Shingrix) 11/22/2018, 02/25/2019    Conditions to be addressed/monitored:  Hypertension, Hyperlipidemia and Diabetes  Care Plan : Pharmacy Care Plan  Updates made by Mayford Knife, RPH since 07/15/2020 12:00 AM    Problem: HTN, HLD, DM   Priority: High    Long-Range Goal: Disease Management   Start Date: 07/07/2020  This Visit's Progress: On track  Priority: High  Note:    Current Barriers:  . Unable to independently monitor therapeutic efficacy   Pharmacist Clinical Goal(s):  Marland Kitchen Patient will achieve adherence to monitoring guidelines and medication adherence to achieve therapeutic  efficacy through collaboration with PharmD and provider.    Interventions: . 1:1 collaboration with Glendale Chard, MD  regarding development and update of comprehensive plan of care as evidenced by provider attestation and co-signature . Inter-disciplinary care team collaboration (see longitudinal plan of care) . Comprehensive medication review performed; medication list updated in electronic medical record  Hypertension (BP goal <130/80) -Controlled -Current treatment: . Telmisartan- HCTZ 40-12.5 mg- take 1 tablet by mouth daily  -Current home readings: patient reports that she checks her BP once per week. Last BP reading was 145/62.  -Denies hypotensive/hypertensive symptoms -Educated on BP goals and benefits of medications for prevention of heart attack, stroke and kidney damage; Daily salt intake goal < 2300 mg; Exercise goal of 150 minutes per week; Importance of home blood pressure monitoring; -Counseled to monitor BP at home once per day, document, and provide log at future appointments -Counseled on diet and exercise extensively Recommended to continue current medication  Hyperlipidemia: (LDL goal < 70) -Uncontrolled -Current treatment: . Livalo 4 mg taking 1 tablet by mouth daily  -Current dietary patterns: Patient reports eating fried chicken about twice a week.  -Patient reports that she is going to decrease eating fried chicken to once a week. -Educated on Cholesterol goals;  Benefits of statin for ASCVD risk reduction; Importance of limiting foods high in cholesterol; -Counseled on diet and exercise extensively Recommended to continue current medication Educated on the importance of taking her cholesterol medication at the same time each day. Recommend patient have LDL checked at next office visit with Dr. Baird Cancer.   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 Xultophy - 25 units at night prior to going to bed . Metformin 500 mg  -taking 1 tablet daily in the morning  -Current home glucose readings . fasting glucose: 111 - 82  . post prandial glucose: 160- 183  -Denies hypoglycemic/hyperglycemic symptoms -Current meal patterns:  . breakfast: bowl of oatmeal or boost and egg  . lunch: sandwich and chips and dip  . dinner: depends on what she feels like cooking or eating usually one vegetable, one starch and one meat.  o Eating frozen vegetables and she is trying to avoid canned good  . drinks: diet soda, and water at least 64 ounces per day  -Current exercise: walking up the stairs and walking more about 15 minutes on her own, twice per week  -Educated on Exercise goal of 150 minutes per week; Benefits of routine self-monitoring of blood sugar; Carbohydrate counting and/or plate method -Counseled to check feet daily and get yearly eye exams -Counseled on diet and exercise extensively Recommended to continue current medication, reviewed patients current medications and there are no renal adjustments neccesary at this time.   Patient Goals/Self-Care Activities . Patient will:  - take medications as prescribed check blood pressure once per week, document, and provide at future appointments  Follow Up Plan: Telephone follow up appointment with care management team member scheduled for: The patient has been provided with contact information for the care management team and has been advised to call with any health related questions or concerns.       Medication Assistance: None required.  Patient affirms current coverage meets needs.  Patient's preferred pharmacy is:  [facility] [place], [address] [place] [address] Phone: [phone] Fax: [phone]  Uses pill box? Yes Pt endorses 95% compliance  We discussed: Benefits of medication synchronization, packaging and delivery as well as enhanced pharmacist oversight  with [employer]. Patient  decided to: Continue current medication management strategy  Care Plan and Follow Up Patient Decision:  Patient agrees to Care Plan and Follow-up.  Plan: Telephone follow up appointment with care management team member scheduled for:  08/13/2020 and The patient has been provided with contact information for the care management team and has been advised to call with any health related questions or concerns.   Orlando Penner, PharmD Clinical Pharmacist [facility] [phone]

## 2020-07-07 NOTE — Progress Notes (Addendum)
07/07/20-Notified the patient of her CCM Call follow up appointment tomorrow, 07/08/20 at 2:00 PM with Orlando Penner, CPP. Patient was advised to have medications and supplements near during phone visit. Patient voiced understanding.  Orlando Penner, CPP Notified.  Raynelle Highland, [place] Pharmacist Assistant [phone] CCM Total Time: 7 minutes  I have reviewed the care management and care coordination activities outlined in this encounter and I am certifying that I agree with the content of this note. No further action required.  Mayford Knife, [facility] 07/08/20 9:35 AM

## 2020-07-08 ENCOUNTER — Ambulatory Visit (INDEPENDENT_AMBULATORY_CARE_PROVIDER_SITE_OTHER): Payer: Medicare Other

## 2020-07-08 DIAGNOSIS — E7800 Pure hypercholesterolemia, unspecified: Secondary | ICD-10-CM | POA: Diagnosis not present

## 2020-07-08 DIAGNOSIS — I129 Hypertensive chronic kidney disease with stage 1 through stage 4 chronic kidney disease, or unspecified chronic kidney disease: Secondary | ICD-10-CM | POA: Diagnosis not present

## 2020-07-08 DIAGNOSIS — E1122 Type 2 diabetes mellitus with diabetic chronic kidney disease: Secondary | ICD-10-CM | POA: Diagnosis not present

## 2020-07-08 DIAGNOSIS — N182 Chronic kidney disease, stage 2 (mild): Secondary | ICD-10-CM

## 2020-07-13 ENCOUNTER — Other Ambulatory Visit: Payer: Self-pay | Admitting: Internal Medicine

## 2020-07-15 NOTE — Patient Instructions (Signed)
Visit Information It was great speaking with you today!  Please let me know if you have any questions about our visit.   Patient Care Plan: Pharmacy Care Plan    Problem Identified: HTN, HLD, DM   Priority: High    Long-Range Goal: Disease Management   Start Date: 07/07/2020  This Visit's Progress: On track  Priority: High  Note:    Current Barriers:  . Unable to independently monitor therapeutic efficacy   Pharmacist Clinical Goal(s):  Marland Kitchen Patient will achieve adherence to monitoring guidelines and medication adherence to achieve therapeutic efficacy through collaboration with PharmD and provider.    Interventions: . 1:1 collaboration with Glendale Chard, MD regarding development and update of comprehensive plan of care as evidenced by provider attestation and co-signature . Inter-disciplinary care team collaboration (see longitudinal plan of care) . Comprehensive medication review performed; medication list updated in electronic medical record  Hypertension (BP goal <130/80) -Controlled -Current treatment: . Telmisartan- HCTZ 40-12.5 mg- take 1 tablet by mouth daily  -Current home readings: patient reports that she checks her BP once per week. Last BP reading was 145/62.  -Denies hypotensive/hypertensive symptoms -Educated on BP goals and benefits of medications for prevention of heart attack, stroke and kidney damage; Daily salt intake goal < 2300 mg; Exercise goal of 150 minutes per week; Importance of home blood pressure monitoring; -Counseled to monitor BP at home once per day, document, and provide log at future appointments -Counseled on diet and exercise extensively Recommended to continue current medication  Hyperlipidemia: (LDL goal < 70) -Uncontrolled -Current treatment: . Livalo 4 mg taking 1 tablet by mouth daily  -Current dietary patterns: Patient reports eating fried chicken about twice a week.  -Patient reports that she is going to decrease eating fried  chicken to once a week. -Educated on Cholesterol goals;  Benefits of statin for ASCVD risk reduction; Importance of limiting foods high in cholesterol; -Counseled on diet and exercise extensively Recommended to continue current medication Educated on the importance of taking her cholesterol medication at the same time each day. Recommend patient have LDL checked at next office visit with Dr. Baird Cancer.   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 Xultophy - 25 units at night prior to going to bed . Metformin 500 mg -taking 1 tablet daily in the morning  -Current home glucose readings . fasting glucose: 111 - 82  . post prandial glucose: 160- 183  -Denies hypoglycemic/hyperglycemic symptoms -Current meal patterns:  . breakfast: bowl of oatmeal or boost and egg  . lunch: sandwich and chips and dip  . dinner: depends on what she feels like cooking or eating usually one vegetable, one starch and one meat.  o Eating frozen vegetables and she is trying to avoid canned good  . drinks: diet soda, and water at least 64 ounces per day  -Current exercise: walking up the stairs and walking more about 15 minutes on her own, twice per week  -Educated on Exercise goal of 150 minutes per week; Benefits of routine self-monitoring of blood sugar; Carbohydrate counting and/or plate method -Counseled to check feet daily and get yearly eye exams -Counseled on diet and exercise extensively Recommended to continue current medication, reviewed patients current medications and there are no renal adjustments neccesary at this time.   Patient Goals/Self-Care Activities . Patient will:  - take medications as prescribed check blood pressure once per week, document, and provide at future appointments  Follow Up Plan: Telephone follow up appointment  with care management team member scheduled for: The patient has been provided with contact information for the care management  team and has been advised to call with any health related questions or concerns.       Patient agreed to services and verbal consent obtained.   The patient verbalized understanding of instructions, educational materials, and care plan provided today and agreed to receive a mailed copy of patient instructions, educational materials, and care plan.   Orlando Penner, PharmD Clinical Pharmacist [facility] [phone]

## 2020-07-17 ENCOUNTER — Other Ambulatory Visit: Payer: Self-pay

## 2020-07-17 ENCOUNTER — Encounter: Payer: Self-pay | Admitting: Nurse Practitioner

## 2020-07-17 ENCOUNTER — Ambulatory Visit
Admission: RE | Admit: 2020-07-17 | Discharge: 2020-07-17 | Disposition: A | Discharge status: Home / Self Care | Payer: Medicare Other | Source: Ambulatory Visit | Attending: Nurse Practitioner | Admitting: Nurse Practitioner

## 2020-07-17 ENCOUNTER — Other Ambulatory Visit: Payer: Self-pay | Admitting: Nurse Practitioner

## 2020-07-17 ENCOUNTER — Ambulatory Visit (INDEPENDENT_AMBULATORY_CARE_PROVIDER_SITE_OTHER): Payer: Medicare Other | Admitting: Nurse Practitioner

## 2020-07-17 VITALS — BP 142/80 | HR 95 | Temp 98.0°F | Ht 61.2 in | Wt 173.2 lb

## 2020-07-17 DIAGNOSIS — R0781 Pleurodynia: Secondary | ICD-10-CM

## 2020-07-17 MED ORDER — DICLOFENAC SODIUM 1 % EX GEL: 2.0000 g | Freq: Four times a day (QID) | CUTANEOUS | 2 refills | Status: DC

## 2020-07-17 NOTE — Progress Notes (Signed)
I,Yamilka Roman [employer] as a Education administrator for [employer], FNP.,have documented all relevant documentation on the behalf of Minette Brine, FNP,as directed by  Minette Brine, FNP while in the presence of Minette Brine, [place]. This visit occurred during the SARS-CoV-2 public health emergency.  Safety protocols were in place, including screening questions prior to the visit, additional usage of staff PPE, and extensive cleaning of exam room while observing appropriate contact time as indicated for disinfecting solutions.  Subjective:     Patient ID: April Larson , female    DOB: [birth year] , 70 y.o.   MRN: [id number]   Chief Complaint  Patient presents with  . Rib pain    Patient stated she was reaching over her washer machine and she heard a pop. Patient stated it hurts to breathe and especially when she is moving around.    HPI  Patient presents today for rib pain. Patient stated she was reaching over her washer machine and she heard a pop. Patient stated it hurts to breathe and especially when she is moving around.  She feels like she injured the same side rib.  Denies shortness of breath, hurts to take a deep breath.       Past Medical History:  Diagnosis Date  . Diabetes mellitus without complication ([place])   . Hypertension   . Substance abuse ([place])      Family History  Problem Relation Age of Onset  . Cancer Mother   . Diabetes Father   . Cancer Brother   . Breast cancer Maternal Aunt      Current Outpatient Medications:  .  ACCU-CHEK FASTCLIX LANCETS MISC, USE TO CHECK BLOOD SUGARS TWICE DAILY, Disp: 100 each, Rfl: 11 .  ACCU-CHEK GUIDE test strip, USE TO CHECK BLOOD SUGARS TWICE DAILY AS DIRECTED, Disp: 100 strip, Rfl: 2 .  Ascorbic Acid (VITAMIN C PO), Take 1 tablet by mouth daily., Disp: , Rfl:  .  aspirin EC 81 MG tablet, Take 81 mg by mouth daily., Disp: , Rfl:  .  B-D ULTRAFINE III SHORT PEN 31G X 8 MM MISC, USE AS DIRECTED TWICE DAILY, Disp: 100 each,  Rfl: 11 .  Biotin 10 MG CAPS, Take 1 tablet by mouth daily., Disp: , Rfl:  .  Blood Glucose Monitoring Suppl (ACCU-CHEK GUIDE ME) w/Device KIT, USE TO CHECK BLOOD GLUCOSE TWICE DAILY, Disp: 3 kit, Rfl: 3 .  calcium carbonate (OSCAL) 1500 (600 Ca) MG TABS tablet, Take 600 mg of elemental calcium by mouth daily with breakfast., Disp: , Rfl:  .  Cholecalciferol 125 MCG (5000 UT) TABS, Take 5,000 Units by mouth daily. , Disp: , Rfl:  .  fluticasone (FLONASE) 50 MCG/ACT nasal spray, SHAKE LIQUID AND USE 2 SPRAYS IN EACH NOSTRIL DAILY, Disp: 16 g, Rfl: 2 .  LIVALO 4 MG TABS, TAKE 1 TABLET BY MOUTH DAILY, Disp: 90 tablet, Rfl: 1 .  loratadine (CLARITIN) 10 MG tablet, Take 10 mg by mouth daily. AM, Disp: , Rfl:  .  metFORMIN (GLUCOPHAGE) 500 MG tablet, Take 1 tablet (500 mg total) by mouth daily with breakfast. Dinner, Disp: 180 tablet, Rfl: 3 .  Multiple Vitamins-Minerals (ONE-A-DAY WOMENS 50+ ADVANTAGE PO), Take 1 tablet by mouth daily., Disp: , Rfl:  .  NON FORMULARY, 2 each. Calm, Disp: , Rfl:  .  omeprazole (PRILOSEC) 40 MG capsule, TAKE 1 CAPSULE BY MOUTH EVERY DAY BEFORE A MEAL, Disp: 90 capsule, Rfl: 1 .  telmisartan-hydrochlorothiazide (MICARDIS HCT) 40-12.5 MG tablet, TAKE 1  TABLET BY MOUTH DAILY, Disp: 90 tablet, Rfl: 1 .  Turmeric 500 MG CAPS, Take 1 capsule by mouth daily. , Disp: , Rfl:  .  XULTOPHY 100-3.6 UNIT-MG/ML SOPN, INJECT 25 UNITS INTO THE SKIN DAILY, Disp: 27 mL, Rfl: 3 .  diclofenac Sodium (VOLTAREN) 1 % GEL, Apply 2 g topically 4 (four) times daily., Disp: 100 g, Rfl: 2   No Known Allergies   Review of Systems  Constitutional: Negative.  Negative for fatigue.  Respiratory: Positive for cough. Negative for shortness of breath.   Gastrointestinal: Positive for abdominal pain (rib cage).  Skin: Negative.   Psychiatric/Behavioral: Negative.      Today's Vitals   07/17/20 1140  BP: (!) 142/80  Pulse: 95  Temp: 98 F (36.7 C)  TempSrc: Oral  Weight: 173 lb 3.2 oz (78.6  kg)  Height: 5' 1.2" (1.554 m)  PainSc: 8   PainLoc: Rib Cage   Body mass index is 32.51 kg/m.   Objective:  Physical Exam Vitals reviewed.  Constitutional:      General: She is not in acute distress.    Appearance: Normal appearance.  Cardiovascular:     Pulses: Normal pulses.     Heart sounds: Normal heart sounds. No murmur heard.   Pulmonary:     Effort: Pulmonary effort is normal. No respiratory distress.     Breath sounds: Normal breath sounds. No wheezing.  Musculoskeletal:        General: Tenderness (left rib near 11th and 12th ) present.  Neurological:     General: No focal deficit present.     Mental Status: She is alert and oriented to person, place, and time.     Cranial Nerves: No cranial nerve deficit.  Psychiatric:        Mood and Affect: Mood normal.        Behavior: Behavior normal.        Thought Content: Thought content normal.        Judgment: Judgment normal.         Assessment And Plan:     1. Rib pain on left side  Tenderness noted to left 11th and 12th rib otherwise breath sounds are normal.  - DG Ribs Unilateral Left; Future - diclofenac Sodium (VOLTAREN) 1 % GEL; Apply 2 g topically 4 (four) times daily.  Dispense: 100 g; Refill: 2    Patient was given opportunity to ask questions. Patient verbalized understanding of the plan and was able to repeat key elements of the plan. All questions were answered to their satisfaction.  Minette Brine, FNP   I, Minette Brine, FNP, have reviewed all documentation for this visit. The documentation on 07/17/20 for the exam, diagnosis, procedures, and orders are all accurate and complete.   IF YOU HAVE BEEN REFERRED TO A SPECIALIST, IT MAY TAKE 1-2 WEEKS TO SCHEDULE/PROCESS THE REFERRAL. IF YOU HAVE NOT HEARD FROM US/SPECIALIST IN TWO WEEKS, PLEASE GIVE [place] A CALL AT [phone].   THE PATIENT IS ENCOURAGED TO PRACTICE SOCIAL DISTANCING DUE TO THE COVID-19 PANDEMIC.

## 2020-07-19 ENCOUNTER — Other Ambulatory Visit: Payer: Self-pay | Admitting: Nurse Practitioner

## 2020-07-19 DIAGNOSIS — R0781 Pleurodynia: Secondary | ICD-10-CM

## 2020-07-21 ENCOUNTER — Ambulatory Visit: Payer: Medicare Other | Admitting: Neurology

## 2020-07-23 ENCOUNTER — Encounter: Payer: Self-pay | Admitting: Neurology

## 2020-07-23 ENCOUNTER — Ambulatory Visit: Payer: Medicare Other | Admitting: Neurology

## 2020-07-23 VITALS — BP 154/78 | HR 93 | Ht 63.0 in | Wt 170.0 lb

## 2020-07-23 DIAGNOSIS — R471 Dysarthria and anarthria: Secondary | ICD-10-CM | POA: Diagnosis not present

## 2020-07-23 DIAGNOSIS — G20 Parkinson's disease: Secondary | ICD-10-CM | POA: Diagnosis not present

## 2020-07-23 DIAGNOSIS — H519 Unspecified disorder of binocular movement: Secondary | ICD-10-CM | POA: Diagnosis not present

## 2020-07-23 DIAGNOSIS — Z9181 History of falling: Secondary | ICD-10-CM

## 2020-07-23 DIAGNOSIS — R42 Dizziness and giddiness: Secondary | ICD-10-CM | POA: Diagnosis not present

## 2020-07-23 NOTE — Patient Instructions (Signed)
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 This can affect your balance, most patients have a tendency to fall backwards, this can affect your swallowing, speech, your eye movements and can cause memory loss.  You are at risk for falls.   Please monitor your driving skills. I would suggest at this point only local roads, familiar routes, no nighttime and no highway driving.   As discussed, we will proceed with a so-called DaT scan: This is a specialized brain scan designed to help with diagnosis of tremor disorders. A radioactive marker gets injected and the uptake is measured in the brain and compared to normal controls and right side is compared to the left, a change in uptake can help with diagnosis of certain tremor disorders. A brain MRI on the other hand is a brain scan that helps look at the brain structure in more detail overall and look for age-related changes, blood vessel related changes and look for stroke and volume loss which we call atrophy.   We will plan to follow-up after the scan is done and we will call you an update on the report.

## 2020-07-23 NOTE — Progress Notes (Signed)
Subjective:    Patient ID: April Larson is a 70 y.o. female.  HPI     [name], MD, [name] [facility] [address] [place], [place] [address]  Dear Dr. Baird Cancer,    I saw your patient, April Larson, upon your kind request in my sleep clinic today for initial consultation of her dizziness. The patient is accompanied by her older sister today. As you know, April Larson is a 70 year old right-handed woman with an underlying medical history of hypertension, diabetes, reflux disease, allergies, sleep apnea and borderline obesity, who reports a 3+ month history of difficulty with her balance, recent fall with head injury, feeling wobbly, feeling like she could fall again, problems with fine motor control, slowness, change in her speech.  Her sister reports that she has noticed changes in patient's behavior and that she seems to be less attentive at times, seems to zone out.  She has not had any passing out spells or convulsions or twitching or involuntary movements, no tremors.  However, she did take a significant fall.  Patient reports that she fell when she was working at [employer].  This was on 03/27/2020.  She was in the process of knocking at the door where the ushers are and the next thing she realized was that she was on the floor.  She did not have any warning sign, no lightheadedness, no spinning sensation, no headache.  She was assisted by a nurse that was onsite.  She was taken to the emergency room.  I reviewed the emergency room record from 03/27/2020.  She went to [facility] long hospital ED.  She did sustain a rib fracture of the left 10th rib.  She reports that after she had her rib fracture, she stopped using her CPAP but otherwise she has been compliant with it.  After about a month she resumed using her CPAP.  She has not had any difficulty swallowing but has noticed changes in her speech.  Her sister reports  that she seems to speak very deliberately and slowly and also eats slowly.  Patient reports that her handwriting has changed.  It has become much more tremulous and sloppy.  She is to have a good penmanship.  I reviewed your office note from 04/15/2020.  You ordered a brain MRI.   She had a brain MRI with and without contrast on 06/07/2020 and I reviewed the results: IMPRESSION: No evidence of recent infarction, hemorrhage, or mass. No abnormal enhancement.   Mild chronic microvascular ischemic changes.   She had recent labs through your office on 04/15/2020.  Vitamin B12 was 1323, TSH was 1.83, urinalysis negative, CMP showed a glucose of 90, BUN 22, creatinine 0.91, alk phos was elevated at 196, AST and ALT normal.  A1c was mildly elevated at 7.1.  CBC showed WBC at 1.1, hemoglobin 12.4, hematocrit 37.7, platelets mildly elevated at 480.  She had a head CT without contrast on 03/27/2020 and I reviewed the results: IMPRESSION: 1. Left frontal scalp soft tissue swelling without underlying fracture. 2. No acute intracranial abnormality. 3. Mild generalized atrophy and white matter disease likely reflects the sequela of chronic microvascular ischemia.  I previously evaluated her over 2 years ago for sleep apnea.  She had a baseline sleep study on 04/10/2018 which showed an AHI of 12.3/h, REM AHI of 24.3/h, O2 nadir 88%.  She had a CPAP titration study on 06/05/2018 at which time she was recommended to  start CPAP at 9 cm of water pressure.  She did not return for follow-up.  She has been on CPAP.  Current 30-day compliance showed excellent compliance with an average usage of 5 hours and 48 minutes, residual AHI at goal at 3.3/h, leak on the low side, pressure at 9 cm.  Percent use days greater than 4 hours at 90%.   Previously (copied from previous notes for reference):   03/09/18: April Larson is a 70 year old right-handed woman with an underlying medical history of type 2 diabetes, chronic kidney  disease, hypertension, seasonal allergies, and obesity, who was previously diagnosed with obstructive sleep apnea. She was placed on CPAP therapy. She has not had re-evaluation in years. Prior sleep study results are not available for my review today. I reviewed your office note from 01/12/2018. She was complaining of memory loss. she had blood work through your office on 01/12/2018, including RPR, B12, lipid profile, A1c, CMP, TSH. I reviewed the test results. Blood tests were benign, A1c 6.5. Her Epworth sleepiness score is 16 out of 24, fatigue score is 42 out of 63. She reports that she could not tolerate CPAP in the past. She reports that she had a sinus infection after she started using CPAP. She estimates that this was about 15 years ago. She is widowed; sadly, her husband passed away in 04-28-17. She lives alone, no children, has 1 dog in the household, he sleeps on the bed with her. She is retired. She quit smoking in 2004, does not utilize alcohol and does not drink caffeine on a regular basis. Her bedtime is around 11:30 and she is typically asleep at midnight. She does have a TV on in her bedroom which tends to stay on all night. Rise time is around 6:50 AM. She has nocturia about once per average night and denies recurrent morning headaches. She is not aware of any family history of OSA. She typically takes a nap for an hour around 4 PM daily. She tried a fullface mask in the past for her CPAP machine and it was uncomfortable. She would be willing to get retested and consider CPAP therapy again. She has had some short-term memory issues.  Her Past Medical History Is Significant For: Past Medical History:  Diagnosis Date  . Diabetes mellitus without complication ([place])   . Hypertension   . Substance abuse ([place])     Her Past Surgical History Is Significant For: Past Surgical History:  Procedure Laterality Date  . EYE SURGERY      Her Family History Is Significant For: Family History   Problem Relation Age of Onset  . Cancer Mother   . Diabetes Father   . Cancer Brother   . Breast cancer Maternal Aunt     Her Social History Is Significant For: Social History   Socioeconomic History  . Marital status: Widowed    Spouse name: Not on file  . Number of children: Not on file  . Years of education: Not on file  . Highest education level: Not on file  Occupational History  . Occupation: retired  Tobacco Use  . Smoking status: Former Smoker    Packs/day: 1.00    Years: 30.00    Pack years: 30.00    Types: Cigarettes    Quit date: 2004    Years since quitting: 18.3  . Smokeless tobacco: Never Used  Vaping Use  . Vaping Use: Never used  Substance and Sexual Activity  . Alcohol use: No  Alcohol/week: 0.0 standard drinks  . Drug use: No  . Sexual activity: Not Currently  Other Topics Concern  . Not on file  Social History Narrative  . Not on file   Social Determinants of Health   Financial Resource Strain: Low Risk   . Difficulty of Paying Living Expenses: Not hard at all  Food Insecurity: No Food Insecurity  . Worried About Charity fundraiser in the Last Year: Never true  . Ran Out of Food in the Last Year: Never true  Transportation Needs: No Transportation Needs  . Lack of Transportation (Medical): No  . Lack of Transportation (Non-Medical): No  Physical Activity: Inactive  . Days of Exercise per Week: 0 days  . Minutes of Exercise per Session: 0 min  Stress: No Stress Concern Present  . Feeling of Stress : Not at all  Social Connections: Not on file    Her Allergies Are:  No Known Allergies:   Her Current Medications Are:  Outpatient Encounter Medications as of 07/23/2020  Medication Sig  . ACCU-CHEK FASTCLIX LANCETS MISC USE TO CHECK BLOOD SUGARS TWICE DAILY  . ACCU-CHEK GUIDE test strip USE TO CHECK BLOOD SUGARS TWICE DAILY AS DIRECTED  . Ascorbic Acid (VITAMIN C PO) Take 1 tablet by mouth daily.  Marland Kitchen aspirin EC 81 MG tablet Take 81 mg  by mouth daily.  . B-D ULTRAFINE III SHORT PEN 31G X 8 MM MISC USE AS DIRECTED TWICE DAILY  . Biotin 10 MG CAPS Take 1 tablet by mouth daily.  . Blood Glucose Monitoring Suppl (ACCU-CHEK GUIDE ME) w/Device KIT USE TO CHECK BLOOD GLUCOSE TWICE DAILY  . calcium carbonate (OSCAL) 1500 (600 Ca) MG TABS tablet Take 600 mg of elemental calcium by mouth daily with breakfast.  . Cholecalciferol 125 MCG (5000 UT) TABS Take 5,000 Units by mouth daily.   . diclofenac Sodium (VOLTAREN) 1 % GEL APPLY 2 GRAMS TOPICALLY TO THE AFFECTED AREA FOUR TIMES DAILY  . fluticasone (FLONASE) 50 MCG/ACT nasal spray SHAKE LIQUID AND USE 2 SPRAYS IN EACH NOSTRIL DAILY  . LIVALO 4 MG TABS TAKE 1 TABLET BY MOUTH DAILY  . loratadine (CLARITIN) 10 MG tablet Take 10 mg by mouth daily. AM  . metFORMIN (GLUCOPHAGE) 500 MG tablet Take 1 tablet (500 mg total) by mouth daily with breakfast. Dinner (Patient taking differently: Take 500 mg by mouth daily with breakfast.)  . Multiple Vitamins-Minerals (ONE-A-DAY WOMENS 50+ ADVANTAGE PO) Take 1 tablet by mouth daily.  . NON FORMULARY 2 each. Calm  . Omega-3 Fatty Acids (FISH OIL PO) Take by mouth.  Marland Kitchen omeprazole (PRILOSEC) 40 MG capsule TAKE 1 CAPSULE BY MOUTH EVERY DAY BEFORE A MEAL  . telmisartan-hydrochlorothiazide (MICARDIS HCT) 40-12.5 MG tablet TAKE 1 TABLET BY MOUTH DAILY  . Turmeric 500 MG CAPS Take 1 capsule by mouth 2 (two) times daily.  [name] 100-3.6 UNIT-MG/ML SOPN INJECT 25 UNITS INTO THE SKIN DAILY   No facility-administered encounter medications on file as of 07/23/2020.  :   Review of Systems:  Out of a complete 14 point review of systems, all are reviewed and negative with the exception of these symptoms as listed below:  Review of Systems  Neurological:       Here for consult on worsening dizziness. Pt reports she fell at work and hit her head on the cement back in December and since dizziness has been present. MRI was completed back in March and CT at the  time of the fall(  images in epic) She also sts she would like to discuss a brain otc supplement.     Objective:  Neurological Exam  Physical Exam Physical Examination:   Vitals:   07/23/20 1004  BP: (!) 154/78  Pulse: 93   On orthostatic testing, standing blood pressure and pulse are 164/82 with a pulse of 102.  She denies any orthostatic lightheadedness or vertiginous symptoms.  General Examination: The patient is a very pleasant 70 y.o. female in no acute distress. She appears well-developed and well-nourished and well groomed.   HEENT: Normocephalic, atraumatic, pupils are equal, round and reactive to light, extraocular tracking is impaired, slow eye movements side to side but limitation to upgaze and downgaze, no nystagmus.  Decrease in eye blink rate noted and facial masking noted.  She has mild to moderate nuchal rigidity and decreased active range of motion in her neck.  She has slow speech, mild hypophonia at times.  She has minimal or slight dysarthria at times.  She has no obvious sialorrhea.  Tongue protrudes centrally and palate elevates symmetrically, no carotid bruits.    Chest: Clear to auscultation without wheezing, rhonchi or crackles noted.  Heart: S1+S2+0, regular and normal without murmurs, rubs or gallops noted.   Abdomen: Soft, non-tender and non-distended.  Extremities: There is no pitting edema in the distal lower extremities bilaterally.   Skin: Warm and dry without trophic changes noted.  Musculoskeletal: exam reveals no obvious joint deformities, tenderness or joint swelling.  Reports residual tenderness in her left lower rib cage area.  Neurologically:  Mental status: The patient is awake, alert and oriented in all 4 spheres. Her immediate and remote memory, attention, language skills and fund of knowledge are appropriate. Thought process is linear. Mood is normal and affect is normal.  She is slow to respond at times, mild bradyphrenia. Cranial  nerves II - XII are as described above under HEENT exam.  Motor exam: Normal bulk, global strength of 4+ out of 5, increase in tone in the right more than left upper extremity, no obvious cogwheeling noted.  No resting tremor noted.  Romberg is not tested due to safety concerns, reflexes are 1-2+ throughout.  Fine motor skill testing reveals decreased ability with right-sided predominance of finger taps, and foot taps, in the mild-to-moderate range, mild impairment of hand movements bilaterally, mild impairment of rapid alternating padding in the bilateral hands.   Cerebellar testing: No dysmetria or intention tremor on finger to nose testing. Heel to shin is unremarkable bilaterally.  Sensory exam: intact to light touch.  Gait, station and balance: She stands with mild difficulty and pushes herself up.  Posture is age-appropriate to mildly stooped, stance is mildly wider base.  She has no walking aid.  She walks slowly and cautiously, mildly decreased arm swing noted right more than left, no obvious shuffling noted.   Assessment and Plan:  In summary, April Larson is a very pleasant 70 year old female with an underlying medical history of hypertension, diabetes, reflux disease, allergies, sleep apnea and borderline obesity, who presents for evaluation of her dizziness.  She has a 3+ month history of difficulty with fine motor control, feeling off balance, slowness, changes in her handwriting and changes in her speech.  History and examination are concerning for underlying parkinsonism.  I am evaluating her after nearly 2-1/2 years, she has had significant changes in her symptoms and examination.  I have evaluated her for sleep apnea in December 2019 and she underwent baseline sleep  testing in January 2020 with mild to moderate findings at the time as well as a subsequent titration study in March 2020 with good results on CPAP therapy.  In fact, she has been compliant with her CPAP with  good control of her sleep apnea events.  I had a long discussion with the patient and her sister about her symptoms and my findings.  Atypical parkinsonism is a possibility including PSP in particular.   She was evaluated in the emergency room in December 2021 after her fall.  She had a recent brain MRI which showed relatively benign findings.  I would like to proceed with a nuclear medicine SPECT scan called DaTscan.  I explained this to the patient and her sister.  Informed consent was obtained for this today.  I explained that this scan is not a definitive test for Parkinson's disease or parkinsonian syndromes but a helpful tool especially when the diagnosis is challenging.  We will do an updated test for her kidney function in preparation for her DaTscan.  We provided additional information.  We will plan to follow-up after testing.  We talked about the importance of healthy lifestyle, treating sleep apnea, keeping well-hydrated and fall prevention today.  This was an extended visit.  We will keep her posted as to her test results by phone call as well in the interim.  I answered all their questions today and the patient and her sister were in agreement.  Thank you very much for allowing me to participate in the care of this nice patient. If I can be of any further assistance to you please do not hesitate to call me at [phone].  Sincerely,   [name], MD, [name]

## 2020-07-24 LAB — COMPREHENSIVE METABOLIC PANEL
ALT: 15 IU/L (ref 0–32)
AST: 15 IU/L (ref 0–40)
Albumin/Globulin Ratio: 1.5 (ref 1.2–2.2)
Albumin: 4.5 g/dL (ref 3.8–4.8)
Alkaline Phosphatase: 117 IU/L (ref 44–121)
BUN/Creatinine Ratio: 24 (ref 12–28)
BUN: 20 mg/dL (ref 8–27)
Bilirubin Total: 0.2 mg/dL (ref 0.0–1.2)
CO2: 22 mmol/L (ref 20–29)
Calcium: 10.5 mg/dL — ABNORMAL HIGH (ref 8.7–10.3)
Chloride: 104 mmol/L (ref 96–106)
Creatinine, Ser: 0.82 mg/dL (ref 0.57–1.00)
Globulin, Total: 3.1 g/dL (ref 1.5–4.5)
Glucose: 90 mg/dL (ref 65–99)
Potassium: 4.3 mmol/L (ref 3.5–5.2)
Sodium: 141 mmol/L (ref 134–144)
Total Protein: 7.6 g/dL (ref 6.0–8.5)
eGFR: 78 mL/min/{1.73_m2} (ref >59)

## 2020-07-25 NOTE — Progress Notes (Signed)
Other than borderline increase in calcium, chemistry panel is normal, okay to proceed with a DaTscan as discussed. Please update pt.

## 2020-07-28 ENCOUNTER — Telehealth: Payer: Self-pay

## 2020-07-28 NOTE — Telephone Encounter (Signed)
-----   Message from [name], MD sent at 07/25/2020 11:13 AM EDT ----- Other than borderline increase in calcium, chemistry panel is normal, okay to proceed with a DaTscan as discussed. Please update pt.

## 2020-07-28 NOTE — Telephone Encounter (Signed)
I called the pt and advised of results and she verbalized understanding.

## 2020-07-30 ENCOUNTER — Encounter: Payer: Self-pay | Admitting: Internal Medicine

## 2020-08-04 ENCOUNTER — Ambulatory Visit: Payer: Medicare Other | Admitting: Internal Medicine

## 2020-08-05 ENCOUNTER — Telehealth: Payer: Self-pay | Admitting: Neurology

## 2020-08-05 NOTE — Telephone Encounter (Signed)
faxed all the information to [place] it is pending

## 2020-08-06 ENCOUNTER — Telehealth: Payer: Medicare Other

## 2020-08-11 ENCOUNTER — Ambulatory Visit (INDEPENDENT_AMBULATORY_CARE_PROVIDER_SITE_OTHER): Payer: Medicare Other | Admitting: Internal Medicine

## 2020-08-11 ENCOUNTER — Other Ambulatory Visit: Payer: Self-pay

## 2020-08-11 ENCOUNTER — Encounter: Payer: Self-pay | Admitting: Internal Medicine

## 2020-08-11 VITALS — BP 122/62 | HR 91 | Temp 98.3°F | Ht 62.4 in | Wt 170.6 lb

## 2020-08-11 DIAGNOSIS — R059 Cough, unspecified: Secondary | ICD-10-CM | POA: Diagnosis not present

## 2020-08-11 DIAGNOSIS — R000 Tachycardia, unspecified: Secondary | ICD-10-CM | POA: Insufficient documentation

## 2020-08-11 DIAGNOSIS — Z6830 Body mass index (BMI) 30.0-30.9, adult: Secondary | ICD-10-CM

## 2020-08-11 DIAGNOSIS — E1122 Type 2 diabetes mellitus with diabetic chronic kidney disease: Secondary | ICD-10-CM

## 2020-08-11 DIAGNOSIS — N182 Chronic kidney disease, stage 2 (mild): Secondary | ICD-10-CM

## 2020-08-11 DIAGNOSIS — E785 Hyperlipidemia, unspecified: Secondary | ICD-10-CM | POA: Diagnosis not present

## 2020-08-11 DIAGNOSIS — E7800 Pure hypercholesterolemia, unspecified: Secondary | ICD-10-CM

## 2020-08-11 DIAGNOSIS — I129 Hypertensive chronic kidney disease with stage 1 through stage 4 chronic kidney disease, or unspecified chronic kidney disease: Secondary | ICD-10-CM

## 2020-08-11 DIAGNOSIS — E6609 Other obesity due to excess calories: Secondary | ICD-10-CM

## 2020-08-11 DIAGNOSIS — H538 Other visual disturbances: Secondary | ICD-10-CM

## 2020-08-11 DIAGNOSIS — E559 Vitamin D deficiency, unspecified: Secondary | ICD-10-CM

## 2020-08-11 NOTE — Patient Instructions (Signed)
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
## 2020-08-12 ENCOUNTER — Telehealth: Payer: Self-pay

## 2020-08-12 LAB — HEMOGLOBIN A1C
Est. average glucose Bld gHb Est-mCnc: 160 mg/dL
Hgb A1c MFr Bld: 7.2 % — ABNORMAL HIGH (ref 4.8–5.6)

## 2020-08-12 LAB — VITAMIN D 25 HYDROXY (VIT D DEFICIENCY, FRACTURES): Vit D, 25-Hydroxy: 45.2 ng/mL (ref 30.0–100.0)

## 2020-08-12 LAB — NOVEL CORONAVIRUS, NAA: SARS-CoV-2, NAA: NOT DETECTED

## 2020-08-12 LAB — SARS-COV-2, NAA 2 DAY TAT

## 2020-08-12 NOTE — Chronic Care Management (AMB) (Signed)
Called patient for an appointment reminder with April Larson, [place] on 08-13-2020 at 11:30. Instructed patient to have all meds/supplements and logs available for review. Patient voiced understanding.  [place]  [phone]

## 2020-08-13 ENCOUNTER — Ambulatory Visit: Payer: Self-pay

## 2020-08-13 ENCOUNTER — Ambulatory Visit (INDEPENDENT_AMBULATORY_CARE_PROVIDER_SITE_OTHER): Payer: Medicare Other

## 2020-08-13 ENCOUNTER — Telehealth: Payer: Medicare Other

## 2020-08-13 DIAGNOSIS — I129 Hypertensive chronic kidney disease with stage 1 through stage 4 chronic kidney disease, or unspecified chronic kidney disease: Secondary | ICD-10-CM

## 2020-08-13 DIAGNOSIS — E1122 Type 2 diabetes mellitus with diabetic chronic kidney disease: Secondary | ICD-10-CM

## 2020-08-13 DIAGNOSIS — E7800 Pure hypercholesterolemia, unspecified: Secondary | ICD-10-CM | POA: Diagnosis not present

## 2020-08-13 DIAGNOSIS — N182 Chronic kidney disease, stage 2 (mild): Secondary | ICD-10-CM

## 2020-08-13 NOTE — Progress Notes (Signed)
Chronic Care Management Pharmacy Note  08/20/2020 Name:  April Larson MRN:  [id number] DOB:  Sep 02, 1950  Subjective: April Larson is an 70 y.o. year old female who is a primary patient of Glendale Chard, MD.  The CCM team was consulted for assistance with disease management and care coordination needs.    Engaged with patient by telephone for follow up visit in response to provider referral for pharmacy case management and/or care coordination services.   Consent to Services:  The patient was given information about Chronic Care Management services, agreed to services, and gave verbal consent prior to initiation of services.  Please see initial visit note for detailed documentation.   Patient Care Team: Glendale Chard, MD as PCP - General (Internal Medicine) Warden Fillers, MD as Consulting Physician (Ophthalmology)  Recent office visits: 08/11/2020 PCP OV   Recent consult visits: 07/28/2020 -Neurology [facility] visits: None in previous 6 months  Objective:  Lab Results  Component Value Date   CREATININE 0.82 07/23/2020   BUN 20 07/23/2020   GFRNONAA 65 04/15/2020   GFRAA 75 04/15/2020   NA 141 07/23/2020   K 4.3 07/23/2020   CALCIUM 10.5 (H) 07/23/2020   CO2 22 07/23/2020   GLUCOSE 90 07/23/2020    Lab Results  Component Value Date/Time   HGBA1C 7.2 (H) 08/11/2020 02:50 PM   HGBA1C 7.1 (H) 04/15/2020 04:07 PM   MICROALBUR 10 10/23/2019 03:42 PM   MICROALBUR 10 10/12/2018 04:36 PM    Last diabetic Eye exam:  Lab Results  Component Value Date/Time   HMDIABEYEEXA No Retinopathy 09/25/2019 12:00 AM    Last diabetic Foot exam: No results found for: HMDIABFOOTEX   Lab Results  Component Value Date   CHOL 176 08/11/2020   HDL 53 08/11/2020   LDLCALC 63 08/11/2020   TRIG 390 (H) 08/11/2020   CHOLHDL 3.3 10/23/2019    Hepatic Function Latest Ref Rng & Units 07/23/2020 04/24/2020 04/15/2020  Total Protein 6.0 - 8.5 g/dL  7.6 - 7.6  Albumin 3.8 - 4.8 g/dL 4.5 - 4.4  AST 0 - 40 IU/L 15 - 16  ALT 0 - 32 IU/L 15 - 11  Alk Phosphatase 44 - 121 IU/L 117 161(H) 196(H)  Total Bilirubin 0.0 - 1.2 mg/dL 0.2 - <0.2     CBC Latest Ref Rng & Units 04/15/2020 03/27/2020 06/20/2019  WBC 3.4 - 10.8 x10E3/uL 10.1 7.2 8.2  Hemoglobin 11.1 - 15.9 g/dL 12.4 11.7(L) 12.9  Hematocrit 34.0 - 46.6 % 37.7 37.0 40.3  Platelets 150 - 450 x10E3/uL 480(H) 324 385    Lab Results  Component Value Date/Time   VD25OH 45.2 08/11/2020 02:50 PM   VD25OH 56.1 10/23/2019 03:34 PM    Clinical ASCVD: No  The 10-year ASCVD risk score Mikey Bussing DC Jr., et al., 2013) is: 19.7%   Values used to calculate the score:     Age: 45 years     Sex: Female     Is Non-Hispanic African American: Yes     Diabetic: Yes     Tobacco smoker: No     Systolic Blood Pressure: 185 mmHg     Is BP treated: Yes     HDL Cholesterol: 53 mg/dL     Total Cholesterol: 176 mg/dL    Depression screen [facility] 2/9 06/19/2020 04/08/2020 08/27/2019  Decreased Interest 0 0 0  Down, Depressed, Hopeless 0 0 0  PHQ - 2 Score 0 0 0  Altered sleeping - - -  Tired, decreased energy - - -  Change in appetite - - -  Feeling bad or failure about yourself  - - -  Trouble concentrating - - -  Moving slowly or fidgety/restless - - -  Suicidal thoughts - - -  PHQ-9 Score - - -  Difficult doing work/chores - - -     Social History   Tobacco Use  Smoking Status Former Smoker  . Packs/day: 1.00  . Years: 30.00  . Pack years: 30.00  . Types: Cigarettes  . Quit date: 2004  . Years since quitting: 18.3  Smokeless Tobacco Never Used   BP Readings from Last 3 Encounters:  08/11/20 122/62  07/23/20 (!) 154/78  07/17/20 (!) 142/80   Pulse Readings from Last 3 Encounters:  08/11/20 91  07/23/20 93  07/17/20 95   Wt Readings from Last 3 Encounters:  08/11/20 170 lb 9.6 oz (77.4 kg)  07/23/20 170 lb (77.1 kg)  07/17/20 173 lb 3.2 oz (78.6 kg)   BMI Readings from Last 3  Encounters:  08/11/20 30.80 kg/m  07/23/20 30.11 kg/m  07/17/20 32.51 kg/m    Assessment/Interventions: Review of patient past medical history, allergies, medications, health status, including review of consultants reports, laboratory and other test data, was performed as part of comprehensive evaluation and provision of chronic care management services.   SDOH:  (Social Determinants of Health) assessments and interventions performed: No  SDOH Screenings   Alcohol Screen: Not on file  Depression (PHQ2-9): Low Risk   . PHQ-2 Score: 0  Financial Resource Strain: Low Risk   . Difficulty of Paying Living Expenses: Not hard at all  Food Insecurity: No Food Insecurity  . Worried About Charity fundraiser in the Last Year: Never true  . Ran Out of Food in the Last Year: Never true  Housing: Low Risk   . Last Housing Risk Score: 0  Physical Activity: Inactive  . Days of Exercise per Week: 0 days  . Minutes of Exercise per Session: 0 min  Social Connections: Not on file  Stress: No Stress Concern Present  . Feeling of Stress : Not at all  Tobacco Use: Medium Risk  . Smoking Tobacco Use: Former Smoker  . Smokeless Tobacco Use: Never Used  Transportation Needs: No Transportation Needs  . Lack of Transportation (Medical): No  . Lack of Transportation (Non-Medical): No    CCM Care Plan  No Known Allergies  Medications Reviewed Today    Reviewed by Lynne Logan, RN (Registered Nurse) on 08/13/20 at 1414  Med List Status: <None>  Medication Order Taking? Sig Documenting Provider Last Dose Status Informant  ACCU-CHEK FASTCLIX LANCETS [place] [id number] No USE TO CHECK BLOOD SUGARS TWICE DAILY Glendale Chard, MD Taking Active   ACCU-CHEK GUIDE test strip [id number] No USE TO CHECK BLOOD SUGARS TWICE DAILY AS DIRECTED Glendale Chard, MD Taking Active   Ascorbic Acid (VITAMIN C PO) [id number] No Take 1 tablet by mouth daily. [provider] Taking Active   aspirin EC 81 MG tablet  [id number] No Take 81 mg by mouth daily. [provider] Taking Active   B-D ULTRAFINE III SHORT PEN 31G X 8 MM MISC [id number] No USE AS DIRECTED TWICE DAILY Glendale Chard, MD Taking Active   Biotin 10 MG CAPS [id number] No Take 1 tablet by mouth daily. [provider] Taking Active   Blood Glucose Monitoring Suppl (ACCU-CHEK GUIDE ME) w/Device KIT [id number] No USE TO CHECK BLOOD GLUCOSE TWICE DAILY Minette Brine,  FNP Taking Active   calcium carbonate (OSCAL) 1500 (600 Ca) MG TABS tablet [id number] No Take 600 mg of elemental calcium by mouth daily with breakfast. [provider] Taking Active Self  Cholecalciferol 125 MCG (5000 UT) TABS [id number] No Take 5,000 Units by mouth daily.  [provider] Taking Active   diclofenac Sodium (VOLTAREN) 1 % GEL [id number] No APPLY 2 GRAMS TOPICALLY TO THE AFFECTED AREA FOUR TIMES DAILY Glendale Chard, MD Taking Active   fluticasone [facility]) 50 MCG/ACT nasal spray [id number] No SHAKE LIQUID AND USE 2 SPRAYS IN EACH NOSTRIL DAILY Glendale Chard, MD Taking Active   LIVALO 4 MG TABS [id number] No TAKE 1 TABLET BY MOUTH DAILY Glendale Chard, MD Taking Active   loratadine (CLARITIN) 10 MG tablet [id number] No Take 10 mg by mouth daily. AM [provider] Taking Active   metFORMIN (GLUCOPHAGE) 500 MG tablet [id number] No Take 1 tablet (500 mg total) by mouth daily with breakfast. Dinner  Patient taking differently: Take 500 mg by mouth daily with breakfast.   Glendale Chard, MD Taking Active   Multiple Vitamins-Minerals (ONE-A-DAY WOMENS 50+ ADVANTAGE PO) [id number] No Take 1 tablet by mouth daily. [provider] Taking Active   NON FORMULARY [id number] No 2 each. Calm [provider] Taking Active            Med Note Jeoffrey Massed Aug 13, 2020 11:53 AM) Taking 2 at night before bed.   Omega-3 Fatty Acids (FISH OIL PO) [id number] No Take by mouth. [provider] Taking Active   omeprazole  (PRILOSEC) 40 MG capsule [id number] No TAKE 1 CAPSULE BY MOUTH EVERY DAY BEFORE A MEAL Minette Brine, FNP Taking Active   telmisartan-hydrochlorothiazide (MICARDIS HCT) 40-12.5 MG tablet [id number] No TAKE 1 TABLET BY MOUTH DAILY Glendale Chard, MD Taking Active   Turmeric 500 MG CAPS [id number] No Take 1 capsule by mouth 2 (two) times daily. [provider] Taking Active   XULTOPHY 100-3.6 UNIT-MG/ML SOPN [id number] No INJECT 25 UNITS INTO THE SKIN DAILY Glendale Chard, MD Taking Active   Med List Note Glendale Chard, MD 10/12/18 1448): Change Xultophy to 22 units 10/12/2018          Patient Active Problem List   Diagnosis Date Noted  . Tachycardia 08/11/2020  . Cough 08/11/2020  . 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  . Pure hypercholesterolemia 01/23/2020  . Vitamin D deficiency 10/23/2019  . Right hip pain 05/15/2018  . Estrogen deficiency 05/15/2018  . Class 1 obesity due to excess calories with serious comorbidity and body mass index (BMI) of 31.0 to 31.9 in adult 05/15/2018  . Diabetes mellitus with stage 2 chronic kidney disease ([place]) 01/11/2018  . Benign hypertension with chronic kidney disease, stage II 01/11/2018    Immunization History  Administered Date(s) Administered  . Fluad Quad(high Dose 65+) 01/01/2019, 12/27/2019  . Influenza, High Dose Seasonal PF 01/12/2018, 01/01/2019  . PFIZER(Purple Top)SARS-COV-2 Vaccination 04/26/2019, 05/18/2019, 03/22/2020  . Pneumococcal Conjugate-13 10/13/2018  . Pneumococcal Polysaccharide-23 03/02/2017  . Tdap 09/18/2015  . Zoster 11/04/2015  . Zoster Recombinat (Shingrix) 11/22/2018, 02/25/2019    Conditions to be addressed/monitored:  Hypertension, Hyperlipidemia and Diabetes  Care Plan : [place]  Updates made by Mayford Knife, [place] since 08/20/2020 12:00 AM    Problem: HTN, HLD, DM   Priority: High    Long-Range Goal: Disease Management   Start Date: 07/07/2020  Recent Progress: On track  Priority:  High  Note:  Current Barriers:  . Unable to independently monitor therapeutic efficacy   Pharmacist Clinical Goal(s):  Marland Kitchen Patient will achieve adherence to monitoring guidelines and medication adherence to achieve therapeutic efficacy through collaboration with PharmD and provider.    Interventions: . 1:1 collaboration with Glendale Chard, MD regarding development and update of comprehensive plan of care as evidenced by provider attestation and co-signature . Inter-disciplinary care team collaboration (see longitudinal plan of care) . Comprehensive medication review performed; medication list updated in electronic medical record  Hypertension (BP goal <130/80) -Controlled -Current treatment: . Telmisartan - Hydrochlorothiazide 40-12.5 mg taking 1 tablet in the morning -Current home readings: patient reports they have been doing okay 145/78-140/72 -Current dietary habits:  -Current exercise habits:she is walking her dog outside, her dog is 69 years old -Denies hypotensive/hypertensive symptoms -Educated on Daily salt intake goal < 2300 mg; Importance of home blood pressure monitoring; Proper BP monitoring technique; -Counseled to monitor BP at home at least once per day, document, and provide log at future appointments -Recommended that patient start using  -Patient is using [employer]ent seasoning substitute recommended patient stop using this and start using Mrs. DASH.  -Going to incorporate exercising five minutes every other day at first.  -Recommended to continue current medication  Hyperlipidemia: (LDL goal < 70) -Uncontrolled -Current treatment: . Livalo 4 mg taking 1 tablet by mouth daily  -Current dietary patterns: Patient reports eating fried chicken about twice a week.  -Patient reports that she is going to decrease eating fried chicken to once a week. -Educated on Cholesterol goals;  Benefits of statin for ASCVD risk reduction; Importance of limiting foods high in  cholesterol; -Counseled on diet and exercise extensively Recommended to continue current medication Educated on the importance of taking her cholesterol medication at the same time each day. Recommend patient have LDL checked at next office visit with Dr. Baird Cancer.   Diabetes (A1c goal <7%) -Controlled -Current medications: . Xultophy 100-3.6  . Metformin 500 mg tablet daily  -Current home glucose readings . fasting glucose: 89-90 . post prandial glucose: 135-140 -Denies hypoglycemic/hyperglycemic symptoms -Current meal patterns: patient reports eating healthy  . breakfast: oatmeal three or four times per week.  Marland Kitchen lunch: eating what she cooked the night before -okra and baked chicken, baked pork chop . dinner: frozen okra, fresh vegetables over canned goods, using an air fryer and baking a lot of food instead of frying it  . snacks: apples and peanut butter . drinks: one diet soda a day can, and drinking 64 ounces of water a day- she is using a 24 ounce bottle of water and she fills it three times  -Current exercise: goal to exercise 5 minutes every other day.  -Educated on Prevention and management of hypoglycemic episodes; -Counseled to check feet daily and get yearly eye exams -Recommended to continue current medication  Vaccination  COVID - 19 Vaccination at [facility] 09/20/2020  Patient Goals/Self-Care Activities . Patient will:  - take medications as prescribed check blood pressure once per week, document, and provide at future appointments  Follow Up Plan: Telephone follow up appointment with care management team member scheduled for: 10/14/2020 The patient has been provided with contact information for the care management team and has been advised to call with any health related questions or concerns.       Medication Assistance: None required.  Patient affirms current coverage meets needs.  Patient's preferred pharmacy is:  [facility] [place], [place]  AT [address] [place] [address] Phone: [phone] Fax: [phone]  Uses pill box? Yes Pt endorses 99% compliance  We discussed: Benefits of medication synchronization, packaging and delivery as well as enhanced pharmacist oversight with [employer]. Patient decided to: Continue current medication management strategy  Care Plan and Follow Up Patient Decision:  Patient agrees to Care Plan and Follow-up.  Plan: The patient has been provided with contact information for the care management team and has been advised to call with any health related questions or concerns.   Orlando Penner, PharmD Clinical Pharmacist [facility] [phone]

## 2020-08-14 LAB — LIPID PANEL W/O CHOL/HDL RATIO
Cholesterol, Total: 176 mg/dL (ref 100–199)
HDL: 53 mg/dL (ref >39)
LDL Chol Calc (NIH): 63 mg/dL (ref 0–99)
Triglycerides: 390 mg/dL — ABNORMAL HIGH (ref 0–149)
VLDL Cholesterol Cal: 60 mg/dL — ABNORMAL HIGH (ref 5–40)

## 2020-08-14 LAB — SPECIMEN STATUS REPORT

## 2020-08-20 NOTE — Patient Instructions (Signed)
Visit Information It was great speaking with you today!  Please let me know if you have any questions about our visit.  Goals Addressed            This Visit's Progress   . Manage My Medicine       Timeframe:  Long-Range Goal Priority:  High Start Date:                             Expected End Date:                       Follow Up Date 10/14/2020   - call for medicine refill 2 or 3 days before it runs out - call if I am sick and can't take my medicine - keep a list of all the medicines I take; vitamins and herbals too - use an alarm clock or phone to remind me to take my medicine    Why is this important?   . These steps will help you keep on track with your medicines.         Patient Care Plan: CCM Pharmacy Care Plan    Problem Identified: HTN, HLD, DM   Priority: High    Long-Range Goal: Disease Management   Start Date: 07/07/2020  Recent Progress: On track  Priority: High  Note:    Current Barriers:  . Unable to independently monitor therapeutic efficacy   Pharmacist Clinical Goal(s):  Marland Kitchen Patient will achieve adherence to monitoring guidelines and medication adherence to achieve therapeutic efficacy through collaboration with PharmD and provider.    Interventions: . 1:1 collaboration with Glendale Chard, MD regarding development and update of comprehensive plan of care as evidenced by provider attestation and co-signature . Inter-disciplinary care team collaboration (see longitudinal plan of care) . Comprehensive medication review performed; medication list updated in electronic medical record  Hypertension (BP goal <130/80) -Controlled -Current treatment: . Telmisartan - Hydrochlorothiazide 40-12.5 mg taking 1 tablet in the morning -Current home readings: patient reports they have been doing okay 145/78-140/72 -Current dietary habits:  -Current exercise habits:she is walking her dog outside, her dog is 45 years old -Denies hypotensive/hypertensive  symptoms -Educated on Daily salt intake goal < 2300 mg; Importance of home blood pressure monitoring; Proper BP monitoring technique; -Counseled to monitor BP at home at least once per day, document, and provide log at future appointments -Recommended that patient start using  -Patient is using [employer]ent seasoning substitute recommended patient stop using this and start using Mrs. DASH.  -Going to incorporate exercising five minutes every other day at first.  -Recommended to continue current medication  Hyperlipidemia: (LDL goal < 70) -Uncontrolled -Current treatment: . Livalo 4 mg taking 1 tablet by mouth daily  -Current dietary patterns: Patient reports eating fried chicken about twice a week.  -Patient reports that she is going to decrease eating fried chicken to once a week. -Educated on Cholesterol goals;  Benefits of statin for ASCVD risk reduction; Importance of limiting foods high in cholesterol; -Counseled on diet and exercise extensively Recommended to continue current medication Educated on the importance of taking her cholesterol medication at the same time each day. Recommend patient have LDL checked at next office visit with Dr. Baird Cancer.   Diabetes (A1c goal <7%) -Controlled -Current medications: . Xultophy 100-3.6  . Metformin 500 mg tablet daily  -Current home glucose readings . fasting glucose: 89-90 . post prandial glucose: 135-140 -Denies hypoglycemic/hyperglycemic  symptoms -Current meal patterns: patient reports eating healthy  . breakfast: oatmeal three or four times per week.  Marland Kitchen lunch: eating what she cooked the night before -okra and baked chicken, baked pork chop . dinner: frozen okra, fresh vegetables over canned goods, using an air fryer and baking a lot of food instead of frying it  . snacks: apples and peanut butter . drinks: one diet soda a day can, and drinking 64 ounces of water a day- she is using a 24 ounce bottle of water and she fills it three  times  -Current exercise: goal to exercise 5 minutes every other day.  -Educated on Prevention and management of hypoglycemic episodes; -Counseled to check feet daily and get yearly eye exams -Recommended to continue current medication  Vaccination  COVID - 19 Vaccination at [facility] 09/20/2020  Patient Goals/Self-Care Activities . Patient will:  - take medications as prescribed check blood pressure once per week, document, and provide at future appointments  Follow Up Plan: Telephone follow up appointment with care management team member scheduled for: 10/14/2020 The patient has been provided with contact information for the care management team and has been advised to call with any health related questions or concerns.        Patient agreed to services and verbal consent obtained.   The patient verbalized understanding of instructions, educational materials, and care plan provided today and agreed to receive a mailed copy of patient instructions, educational materials, and care plan.   Orlando Penner, PharmD Clinical Pharmacist [facility] [phone]

## 2020-08-25 NOTE — Telephone Encounter (Signed)
I called patient's insurance [facility]) @ [phone] and the DaTscan request is still pending. I called and spoke with patient to let her know.

## 2020-08-25 NOTE — Telephone Encounter (Signed)
Pt is asking for a call re: the status of her DAT scan being scheduled at this point.  Please call

## 2020-08-27 NOTE — Telephone Encounter (Signed)
Spoke with [place] @ [employer] [phone]) to check status. He states that the request for DaTscan is pending a denial & needing a peer-to-peer. Case #[id number].

## 2020-08-28 NOTE — Patient Instructions (Signed)
Goals Addressed    . Hypertriglycerides - complications minimized or prevented   On track    Timeframe:  Long-Range Goal Priority:  High Start Date:  08/13/20                           Expected End Date: 08/13/21  Next Scheduled Follow up date: 11/12/20          Self Care Activities:  . Continue to keep all scheduled follow up appointments . Take medications as directed  . Let your healthcare team know if you are unable to take your medications . Call your pharmacy for refills at least 7 days prior to running out of medication . Adhere to dietary and exercise recommendations . Review printed educational material related to Cholesterol Care Guide and Ways to Lower Triglycerides  Patient Goals: - lower Triglycerides                  . Monitor and Manage My Blood Sugar-Diabetes Type 2   On track    Timeframe:  Long-Range Goal Priority:  Medium Start Date: 06/23/20                         Expected End Date:  06/23/21                    Follow Up Date: 11/12/20    - check blood sugar at prescribed times - check blood sugar before and after exercise - check blood sugar if I feel it is too high or too low - enter blood sugar readings and medication or insulin into daily log - take the blood sugar log to all doctor visits - take the blood sugar meter to all doctor visits    Why is this important?    Checking your blood sugar at home helps to keep it from getting very high or very low.   Writing the results in a diary or log helps the doctor know how to care for you.   Your blood sugar log should have the time, date and the results.   Also, write down the amount of insulin or other medicine that you take.   Other information, like what you ate, exercise done and how you were feeling, will also be helpful.     Notes:     . Obtain Eye Exam-Diabetes Type 2       Timeframe:  Long-Range Goal Priority:  High Start Date:  06/23/20                          Expected End Date:  06/23/21                     Follow Up Date: 11/12/20   - keep appointment with eye doctor - schedule appointment with eye doctor    Why is this important?    Eye check-ups are important when you have diabetes.   Vision loss can be prevented.    Notes:     . Perform Foot Care-Diabetes Type 2       Timeframe:  Long-Range Goal Priority:  Medium Start Date:  06/23/20                           Expected End Date:  06/23/21  Follow Up Date: 11/12/20    - check feet daily for cuts, sores or redness - keep feet up while sitting - trim toenails straight across - wash and dry feet carefully every day - wear comfortable, cotton socks - wear comfortable, well-fitting shoes    Why is this important?    Good foot care is very important when you have diabetes.   There are many things you can do to keep your feet healthy and catch a problem early.    Notes:     . Track and Manage My Blood Pressure-Hypertension   On track    Timeframe:  Long-Range Goal Priority:  Medium Start Date:  06/23/20                           Expected End Date:  06/23/21                   Follow Up Date: 11/12/20    - check blood pressure daily - choose a place to take my blood pressure (home, clinic or office, retail store) - write blood pressure results in a log or diary    Why is this important?    You won't feel high blood pressure, but it can still hurt your blood vessels.   High blood pressure can cause heart or kidney problems. It can also cause a stroke.   Making lifestyle changes like losing a Qu[place]im Doyon weight or eating less salt will help.   Checking your blood pressure at home and at different times of the day can help to control blood pressure.   If the doctor prescribes medicine remember to take it the way the doctor ordered.   Call the office if you cannot afford the medicine or if there are questions about it.     Notes:

## 2020-08-28 NOTE — Chronic Care Management (AMB) (Signed)
Chronic Care Management   CCM RN Visit Note  08/13/2020 Name: April Larson MRN: [id number] DOB: 1950/07/29  Subjective: April Larson is a 70 y.o. year old female who is a primary care patient of Glendale Chard, MD. The care management team was consulted for assistance with disease management and care coordination needs.    Engaged with patient by telephone for follow up visit in response to provider referral for case management and/or care coordination services.   Consent to Services:  The patient was given information about Chronic Care Management services, agreed to services, and gave verbal consent prior to initiation of services.  Please see initial visit note for detailed documentation.   Patient agreed to services and verbal consent obtained.   Assessment: Review of patient past medical history, allergies, medications, health status, including review of consultants reports, laboratory and other test data, was performed as part of comprehensive evaluation and provision of chronic care management services.   SDOH (Social Determinants of Health) assessments and interventions performed: Yes, no acute challenges    CCM Care Plan  No Known Allergies  Outpatient Encounter Medications as of 08/13/2020  Medication Sig Note  . ACCU-CHEK FASTCLIX LANCETS MISC USE TO CHECK BLOOD SUGARS TWICE DAILY   . ACCU-CHEK GUIDE test strip USE TO CHECK BLOOD SUGARS TWICE DAILY AS DIRECTED   . Ascorbic Acid (VITAMIN C PO) Take 1 tablet by mouth daily.   Marland Kitchen aspirin EC 81 MG tablet Take 81 mg by mouth daily.   . B-D ULTRAFINE III SHORT PEN 31G X 8 MM MISC USE AS DIRECTED TWICE DAILY   . Biotin 10 MG CAPS Take 1 tablet by mouth daily.   . Blood Glucose Monitoring Suppl (ACCU-CHEK GUIDE ME) w/Device KIT USE TO CHECK BLOOD GLUCOSE TWICE DAILY   . calcium carbonate (OSCAL) 1500 (600 Ca) MG TABS tablet Take 600 mg of elemental calcium by mouth daily with breakfast.   .  Cholecalciferol 125 MCG (5000 UT) TABS Take 5,000 Units by mouth daily.    . diclofenac Sodium (VOLTAREN) 1 % GEL APPLY 2 GRAMS TOPICALLY TO THE AFFECTED AREA FOUR TIMES DAILY   . fluticasone (FLONASE) 50 MCG/ACT nasal spray SHAKE LIQUID AND USE 2 SPRAYS IN EACH NOSTRIL DAILY   . LIVALO 4 MG TABS TAKE 1 TABLET BY MOUTH DAILY   . loratadine (CLARITIN) 10 MG tablet Take 10 mg by mouth daily. AM   . metFORMIN (GLUCOPHAGE) 500 MG tablet Take 1 tablet (500 mg total) by mouth daily with breakfast. Dinner (Patient taking differently: Take 500 mg by mouth daily with breakfast.)   . Multiple Vitamins-Minerals (ONE-A-DAY WOMENS 50+ ADVANTAGE PO) Take 1 tablet by mouth daily.   . NON FORMULARY 2 each. Calm 08/13/2020: Taking 2 at night before bed.   . Omega-3 Fatty Acids (FISH OIL PO) Take by mouth.   Marland Kitchen omeprazole (PRILOSEC) 40 MG capsule TAKE 1 CAPSULE BY MOUTH EVERY DAY BEFORE A MEAL   . telmisartan-hydrochlorothiazide (MICARDIS HCT) 40-12.5 MG tablet TAKE 1 TABLET BY MOUTH DAILY   . Turmeric 500 MG CAPS Take 1 capsule by mouth 2 (two) times daily.   [name] 100-3.6 UNIT-MG/ML SOPN INJECT 25 UNITS INTO THE SKIN DAILY    No facility-administered encounter medications on file as of 08/13/2020.    Patient Active Problem List   Diagnosis Date Noted  . Tachycardia 08/11/2020  . Cough 08/11/2020  . 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  . Pure hypercholesterolemia 01/23/2020  . Vitamin D deficiency  10/23/2019  . Right hip pain 05/15/2018  . Estrogen deficiency 05/15/2018  . Class 1 obesity due to excess calories with serious comorbidity and body mass index (BMI) of 31.0 to 31.9 in adult 05/15/2018  . Diabetes mellitus with stage 2 chronic kidney disease ([place]) 01/11/2018  . Benign hypertension with chronic kidney disease, stage II 01/11/2018    Conditions to be addressed/monitored:DM II with stage 2 CKD, Hypertensive Nephropathy, Pure Hypercholesterolemia   Care Plan : Diabetes Type 2 (Adult)   Updates made by Lynne Logan, RN since 08/13/2020 12:00 AM    Problem: Glycemic Management (Diabetes, Type 2)   Priority: Medium    Long-Range Goal: Glycemic Management Optimized   Start Date: 06/23/2020  Expected End Date: 06/23/2021  Recent Progress: On track  Priority: Medium  Note:   Objective:  Lab Results  Component Value Date   HGBA1C 7.2 (H) 08/11/2020 .   Lab Results  Component Value Date   CREATININE 0.82 07/23/2020   CREATININE 0.91 04/15/2020   CREATININE 1.03 (H) 03/27/2020 .   Lab Results  Component Value Date   EGFR 78 07/23/2020 .   Marland Kitchen No results found for: EGFR Current Barriers:  Marland Kitchen Knowledge Deficits related to basic Diabetes pathophysiology and self care/management . Knowledge Deficits related to medications used for management of diabetes Case Manager Clinical Goal(s):  . patient will demonstrate improved adherence to prescribed treatment plan for diabetes self care/management as evidenced by: daily monitoring and recording of CBG  adherence to ADA/ carb modified diet exercise 5 days/week adherence to prescribed medication regimen contacting provider for new or worsened symptoms or questions Interventions:  08/13/20 completed successful call with patient  . Collaboration with Glendale Chard, MD regarding development and update of comprehensive plan of care as evidenced by provider attestation and co-signature . Inter-disciplinary care team collaboration (see longitudinal plan of care) . Provided education to patient about basic DM disease process . Review of patient status, including review of consultants reports, relevant laboratory and other test results, and medications completed. . Reviewed medications with patient and discussed importance of medication adherence . Reviewed scheduled/upcoming provider appointments including: PCP follow up scheduled for 11/10/20 _0 :15 AM  . Advised patient, providing education and rationale, to check cbg before meals and  at bedtime and record, calling the CCM team and or PCP for findings outside established parameters.   . Discussed plans with patient for ongoing care management follow up and provided patient with direct contact information for care management team Self-Care Activities - Self administers oral medications as prescribed Attends all scheduled provider appointments Checks blood sugars as prescribed and utilize hyper and hypoglycemia protocol as needed Adheres to prescribed ADA/carb modified Patient Goals: - check blood sugar at prescribed times - check blood sugar before and after exercise - check blood sugar if I feel it is too high or too low - enter blood sugar readings and medication or insulin into daily log - take the blood sugar log to all doctor visits - take the blood sugar meter to all doctor visits  Follow Up Plan: Telephone follow up appointment with care management team member scheduled for: 11/12/20   Care Plan : Hypertension (Adult)  Updates made by Lynne Logan, RN since 08/28/2020 12:00 AM    Problem: Hypertension (Hypertension)   Priority: Medium    Long-Range Goal: Hypertension Monitored   Start Date: 06/23/2020  Expected End Date: 06/23/2021  Recent Progress: On track  Priority: Medium  Note:   Objective:  .  Last practice recorded BP readings:  BP Readings from Last 3 Encounters:  08/11/20 122/62  07/23/20 (!) 154/78  07/17/20 (!) 142/80 .   Marland Kitchen Most recent eGFR/CrCl:  Lab Results  Component Value Date   EGFR 78 07/23/2020 .    No components found for: CRCL . Most recent eGFR/CrCl: No results found for: EGFR  No components found for: CRCL Current Barriers:  Marland Kitchen Knowledge Deficits related to basic understanding of hypertension pathophysiology and self care management . Knowledge Deficits related to understanding of medications prescribed for management of hypertension Case Manager Clinical Goal(s):  . patient will demonstrate improved adherence to prescribed  treatment plan for hypertension as evidenced by taking all medications as prescribed, monitoring and recording blood pressure as directed, adhering to low sodium/DASH diet Interventions:  08/13/20 completed successful outbound call to patient  . Collaboration with Glendale Chard, MD regarding development and update of comprehensive plan of care as evidenced by provider attestation and co-signature . Inter-disciplinary care team collaboration (see longitudinal plan of care) . Evaluation of current treatment plan related to hypertension self management and patient's adherence to plan as established by provider. . Provided education to patient re: stroke prevention, s/s of heart attack and stroke, DASH diet, complications of uncontrolled blood pressure . Reviewed medications with patient and discussed importance of compliance . Advised patient, providing education and rationale, to monitor blood pressure daily and record, calling PCP for findings outside established parameters.  . Reviewed scheduled/upcoming provider appointments including: PCP follow up scheduled for 11/10/20 11:15 AM  . Discussed plans with patient for ongoing care management follow up and provided patient with direct contact information for care management team Self-Care Activities: - Self administers medications as prescribed Attends all scheduled provider appointments Calls provider office for new concerns, questions, or BP outside discussed parameters Checks BP and records as discussed Follows a low sodium diet/DASH diet Patient Goals: - check blood pressure daily - write blood pressure results in a log or diary  Follow Up Plan: Telephone follow up appointment with care management team member scheduled for: 11/12/20   Care Plan : Elevated Triglycerides  Updates made by Lynne Logan, RN since 08/28/2020 12:00 AM    Problem: Elevated Triglycerides   Priority: High    Long-Range Goal: Hypertriglyceridemia - complications  minimized or prevented   Start Date: 08/13/2020  Expected End Date: 08/13/2021  This Visit's Progress: On track  Priority: High  Note:   Current Barriers:   Ineffective Self Health Maintenance  Clinical Goal(s):  Marland Kitchen Collaboration with Glendale Chard, MD regarding development and update of comprehensive plan of care as evidenced by provider attestation and co-signature . Inter-disciplinary care team collaboration (see longitudinal plan of care)  patient will work with care management team to address care coordination and chronic disease management needs related to Disease Management  Educational Needs  Care Coordination  Medication Management and Education  Psychosocial Support   Interventions:  Evaluation of current treatment plan related to DM II with stage 2 CKD, Hypertensive Nephropathy, self-management and patient's adherence to plan as established by provider.  Collaboration with Glendale Chard, MD regarding development and update of comprehensive plan of care as evidenced by provider attestation       and co-signature  Inter-disciplinary care team collaboration (see longitudinal plan of care) . Provided education to patient about basic disease process related to elevated Triglycerides  . Review of patient status, including review of consultant's reports, relevant laboratory and other test results, and medications completed. Marland Kitchen  Reviewed medications with patient and discussed importance of medication adherence . Educated patient on dietary and exercise recommendations  . Mailed printed educational material related to Cholesterol Care Guide and Ways to Lower Triglycerides   Discussed plans with patient for ongoing care management follow up and provided patient with direct contact information for care management team Self Care Activities:  . Continue to keep all scheduled follow up appointments . Take medications as directed  . Let your healthcare team know if you are unable to  take your medications . Call your pharmacy for refills at least 7 days prior to running out of medication . Adhere to dietary and exercise recommendations . Review printed educational material related to Cholesterol Care Guide and Ways to Lower Triglycerides  Patient Goals: - lower Triglycerides   Follow Up Plan: Telephone follow up appointment with care management team member scheduled for: 11/14/20     Plan:Telephone follow up appointment with care management team member scheduled for:  11/12/20  Barb Merino, RN, BSN, CCM Care Management Coordinator [place] [facility]  Direct Phone: [phone]

## 2020-09-15 NOTE — Telephone Encounter (Signed)
I am sorry I have overlooked this.  [name]: Can you tell me the reason for denial of her DaTscan?

## 2020-09-16 NOTE — Telephone Encounter (Signed)
[name], would you do me a favor and resubmit with all the diagnosis codes and my clinic notes to support the [place].  I think we need to give it another trial for approval, and then I will do a peer to peer if needed.

## 2020-09-16 NOTE — Telephone Encounter (Signed)
I agree, April Larson submitted this one early May- I reviewed all notes and forms submitted. The only diagnosis code submitted is G20 for Parkinson's. There is no information in notes submitted that would warrant the response we received for denial.  I can attempt to submit a new request for this. I am not sure how they will respond when they receive it but it is the only thing I know to do other than p2p since the appeal was also denied.

## 2020-09-16 NOTE — Telephone Encounter (Signed)
None of this makes sense, did we submit for the diagnosis codes that I associated with the DaTscan request?  The denial does not mention my concern for parkinsonism.

## 2020-09-16 NOTE — Telephone Encounter (Signed)
The reason for denial is as follows:   "We denied the medical services/items because your records do not show you meet criteria for the requested Nuclear medicine test. Your provider asked for nuclear medicine image of tumor in 3D because you have a problem with balance. Your records do not show that your signs and/or symptoms are due to ATTR (amyloid transthyretin) because AL (immunoglobulin light chain amyloidosis) has been ruled out. ATTR is a problem with your heart that causes a protein (transthyretin) deposit in your heart tissues. This makes it hard for your heart to work properly. AL is due to a problem with your plasma cells."

## 2020-09-22 DIAGNOSIS — Z23 Encounter for immunization: Secondary | ICD-10-CM | POA: Diagnosis not present

## 2020-09-22 NOTE — Telephone Encounter (Signed)
New DaTscan ppw completed & given to RN/MD to sign.

## 2020-09-23 NOTE — Telephone Encounter (Signed)
Faxed signed ppw this morning to [place]. Please see the e-mail I received after faxing-   "Hi [name],   We received the attached case today for patient, April Larson.  I had left a voicemail and sent an email to [place] last week on the status of the peer-to-peer call for this patient and what the outcome was.  We have already processed a case for this patient, the PA was denied and a peer-to-peer call was being done but we haven't been provided the outcome of the call.  Can you check on that and let us know?  Please call us with any questions, thank you"

## 2020-09-25 DIAGNOSIS — E119 Type 2 diabetes mellitus without complications: Secondary | ICD-10-CM | POA: Diagnosis not present

## 2020-09-25 DIAGNOSIS — H1045 Other chronic allergic conjunctivitis: Secondary | ICD-10-CM | POA: Diagnosis not present

## 2020-09-25 DIAGNOSIS — H26492 Other secondary cataract, left eye: Secondary | ICD-10-CM | POA: Diagnosis not present

## 2020-09-25 DIAGNOSIS — Z961 Presence of intraocular lens: Secondary | ICD-10-CM | POA: Diagnosis not present

## 2020-09-25 DIAGNOSIS — H04123 Dry eye syndrome of bilateral lacrimal glands: Secondary | ICD-10-CM | POA: Diagnosis not present

## 2020-09-25 DIAGNOSIS — H5000 Unspecified esotropia: Secondary | ICD-10-CM | POA: Diagnosis not present

## 2020-09-25 DIAGNOSIS — H53483 Generalized contraction of visual field, bilateral: Secondary | ICD-10-CM | POA: Diagnosis not present

## 2020-09-25 DIAGNOSIS — Z794 Long term (current) use of insulin: Secondary | ICD-10-CM | POA: Diagnosis not present

## 2020-09-25 LAB — HM DIABETES EYE EXAM

## 2020-09-26 DIAGNOSIS — E1142 Type 2 diabetes mellitus with diabetic polyneuropathy: Secondary | ICD-10-CM | POA: Diagnosis not present

## 2020-09-26 DIAGNOSIS — L84 Corns and callosities: Secondary | ICD-10-CM | POA: Diagnosis not present

## 2020-09-26 DIAGNOSIS — B351 Tinea unguium: Secondary | ICD-10-CM | POA: Diagnosis not present

## 2020-09-26 DIAGNOSIS — L603 Nail dystrophy: Secondary | ICD-10-CM | POA: Diagnosis not present

## 2020-09-30 ENCOUNTER — Encounter: Payer: Self-pay | Admitting: Internal Medicine

## 2020-10-09 NOTE — Telephone Encounter (Signed)
Received approval for DaTscan. PA- [id number] (10/09/20- 11/22/20). Sent to [place] at [place] to schedule.

## 2020-10-13 ENCOUNTER — Telehealth: Payer: Self-pay

## 2020-10-13 NOTE — Chronic Care Management (AMB) (Signed)
   No answer, left message of telephone appointment with Orlando Penner CPP on 10-14-2020 at 11:30.Left message to have all medications, supplements, blood pressure and/or blood sugar logs available during appointment and to return call if need to reschedule.   [place] Pharmacist Assistant [phone]

## 2020-10-14 ENCOUNTER — Ambulatory Visit (INDEPENDENT_AMBULATORY_CARE_PROVIDER_SITE_OTHER): Payer: Medicare Other

## 2020-10-14 DIAGNOSIS — E1122 Type 2 diabetes mellitus with diabetic chronic kidney disease: Secondary | ICD-10-CM

## 2020-10-14 DIAGNOSIS — N182 Chronic kidney disease, stage 2 (mild): Secondary | ICD-10-CM | POA: Diagnosis not present

## 2020-10-14 DIAGNOSIS — I129 Hypertensive chronic kidney disease with stage 1 through stage 4 chronic kidney disease, or unspecified chronic kidney disease: Secondary | ICD-10-CM | POA: Diagnosis not present

## 2020-10-14 NOTE — Patient Instructions (Signed)
Visit Information It was great speaking with you today!  Please let me know if you have any questions about our visit.   Goals Addressed             This Visit's Progress    Manage My Medicine       Timeframe:  Long-Range Goal Priority:  High Start Date:                             Expected End Date:                       Follow Up Date 01/13/2021   - call for medicine refill 2 or 3 days before it runs out - call if I am sick and can't take my medicine - keep a list of all the medicines I take; vitamins and herbals too - use an alarm clock or phone to remind me to take my medicine    Why is this important?   These steps will help you keep on track with your medicines.            Patient Care Plan: CCM Pharmacy Care Plan     Problem Identified: HTN, DM II   Priority: High     Long-Range Goal: Disease Management   Start Date: 07/07/2020  Recent Progress: On track  Priority: High  Note:     Current Barriers:  Unable to independently monitor therapeutic efficacy  Pharmacist Clinical Goal(s):  Patient will achieve adherence to monitoring guidelines and medication adherence to achieve therapeutic efficacy through collaboration with PharmD and provider.   Interventions: 1:1 collaboration with Glendale Chard, MD regarding development and update of comprehensive plan of care as evidenced by provider attestation and co-signature Inter-disciplinary care team collaboration (see longitudinal plan of care) Comprehensive medication review performed; medication list updated in electronic medical record    Hypertension (BP goal <130/80) -Controlled -Current treatment: Telmisartan- Hydrochlorothiazide 40-12.5 mg tablet once per day  -Medications previously tried: Olmesartan - Hydrochlorothiazide 40-12.5 mg   -Current home readings: 140-145/76-78, checking her BP everyday in the evening,  -Current dietary habits: patient reports that she has cut back on fried and fatty  foods. She is eating more grilled and baked food. She has starting using Mrs. Dash and she is no longer using the Accent seasoning.  -Current exercise habits: she is walking her dog 2-3 times per day. She is going to use her SM[employer] TV to was silver sneakers online.  -Collaborated with patient to show her how to find the silver sneakers channel. -Go to YOUTUBE and type in [employer].   -Denies hypotensive/hypertensive symptoms -Educated on Exercise goal of 150 minutes per week; Importance of home blood pressure monitoring; Proper BP monitoring technique; -Counseled to monitor BP at home at least once per day, document, and provide log at future appointments -Counseled on diet and exercise extensively Recommended to continue current medication   Diabetes (A1c goal <7%) -Uncontrolled -Current medications: Metformin 500 mg tablet once per day  Xultophy 100-3.6 UNIT -MG/ML - inject 25 units into the skin once daily  -Current home glucose readings fasting glucose: 100 post prandial glucose: 137 - 140  -Denies hypoglycemic/hyperglycemic symptoms -Current meal patterns:  breakfast: grilled cheese sandwich, or juice and a boiled egg, or oatmeal   lunch: whatever is left from dinner and broccoli dinner: patient reports she usually whips something together sometimes she is using canned vegetables but  she rinses them off snacks: krispy kreme donuts once a week - at least 3, she gets a dozen and she gives some to her siblings and has the rest. She is working on eating more fruits  drinks: increasing her water.  -Current exercise: please see hyperlipidemia above -Educated on A1c and blood sugar goals; Carbohydrate counting and/or plate method -Counseled to check feet daily and get yearly eye exams -Counseled on diet and exercise extensively Recommended to continue current medication Collaborated with PCP to determine if patient is a candidate for the addition of Zetia due her moderate  increase in Triglycerides.   Patient Goals/Self-Care Activities Patient will:  - take medications as prescribed  Follow Up Plan: The patient has been provided with contact information for the care management team and has been advised to call with any health related questions or concerns.        Patient agreed to services and verbal consent obtained.   The patient verbalized understanding of instructions, educational materials, and care plan provided today and agreed to receive a mailed copy of patient instructions, educational materials, and care plan.   Orlando Penner, PharmD Clinical Pharmacist [facility] [phone]

## 2020-10-14 NOTE — Telephone Encounter (Signed)
Noted, thank you

## 2020-10-14 NOTE — Progress Notes (Signed)
Chronic Care Management Pharmacy Note  10/14/2020 Name:  April Larson MRN:  [id number] DOB:  September 06, 1950  Summary: Patient reports that she is eating [employer] donuts every week.   Recommendations/Changes made from today's visit: Recommended patient limit the amount of donuts she eats and not by a full dozen. Recommended trying to increase the amount of fresh fruit and vegetables   Plan: Will collaborate with PCP and determine if patient is a candidate for a different statin medication or the addition of Zetia 10 mg tablet daily.    Subjective: April Larson is an 70 y.o. year old female who is a primary patient of Dorothyann Peng, MD.  The CCM team was consulted for assistance with disease management and care coordination needs.    Engaged with patient by telephone for follow up visit in response to provider referral for pharmacy case management and/or care coordination services. Patient reports that she passed out due to dehydration in December. The neurologist wanted her to do a diagnostic test on her but it was declined by her insurance. She reports that they called her back last week and she was told that she was approved.   Consent to Services:  The patient was given information about Chronic Care Management services, agreed to services, and gave verbal consent prior to initiation of services.  Please see initial visit note for detailed documentation.   Patient Care Team: Dorothyann Peng, MD as PCP - General (Internal Medicine) Sallye Lat, MD as Consulting Physician (Ophthalmology)  Recent office visits: 08/11/2020 PCP Office Visit   Recent consult visits: 07/23/2020 Neurology OV: no medication changes   ED visits: 03/27/2020 ED Visit - snycope and collapse    Objective:  Lab Results  Component Value Date   CREATININE 0.82 07/23/2020   BUN 20 07/23/2020   GFRNONAA 65 04/15/2020   GFRAA 75 04/15/2020   NA 141 07/23/2020    K 4.3 07/23/2020   CALCIUM 10.5 (H) 07/23/2020   CO2 22 07/23/2020   GLUCOSE 90 07/23/2020    Lab Results  Component Value Date/Time   HGBA1C 7.2 (H) 08/11/2020 02:50 PM   HGBA1C 7.1 (H) 04/15/2020 04:07 PM   MICROALBUR 10 10/23/2019 03:42 PM   MICROALBUR 10 10/12/2018 04:36 PM    Last diabetic Eye exam:  Lab Results  Component Value Date/Time   HMDIABEYEEXA No Retinopathy 09/25/2020 12:00 AM    Last diabetic Foot exam: No results found for: HMDIABFOOTEX   Lab Results  Component Value Date   CHOL 176 08/11/2020   HDL 53 08/11/2020   LDLCALC 63 08/11/2020   TRIG 390 (H) 08/11/2020   CHOLHDL 3.3 10/23/2019    Hepatic Function Latest Ref Rng & Units 07/23/2020 04/24/2020 04/15/2020  Total Protein 6.0 - 8.5 g/dL 7.6 - 7.6  Albumin 3.8 - 4.8 g/dL 4.5 - 4.4  AST 0 - 40 IU/L 15 - 16  ALT 0 - 32 IU/L 15 - 11  Alk Phosphatase 44 - 121 IU/L 117 161(H) 196(H)  Total Bilirubin 0.0 - 1.2 mg/dL 0.2 - <2.7      CBC Latest Ref Rng & Units 04/15/2020 03/27/2020 06/20/2019  WBC 3.4 - 10.8 x10E3/uL 10.1 7.2 8.2  Hemoglobin 11.1 - 15.9 g/dL 63.6 11.7(L) 12.9  Hematocrit 34.0 - 46.6 % 37.7 37.0 40.3  Platelets 150 - 450 x10E3/uL 480(H) 324 385    Lab Results  Component Value Date/Time   VD25OH 45.2 08/11/2020 02:50 PM   VD25OH 56.1 10/23/2019 03:34 PM  Clinical ASCVD: No  The 10-year ASCVD risk score Mikey Bussing DC Jr., et al., 2013) is: 19.7%   Values used to calculate the score:     Age: 53 years     Sex: Female     Is Non-Hispanic African American: Yes     Diabetic: Yes     Tobacco smoker: No     Systolic Blood Pressure: 801 mmHg     Is BP treated: Yes     HDL Cholesterol: 53 mg/dL     Total Cholesterol: 176 mg/dL    Depression screen [facility] 2/9 06/19/2020 04/08/2020 08/27/2019  Decreased Interest 0 0 0  Down, Depressed, Hopeless 0 0 0  PHQ - 2 Score 0 0 0  Altered sleeping - - -  Tired, decreased energy - - -  Change in appetite - - -  Feeling bad or failure about yourself  -  - -  Trouble concentrating - - -  Moving slowly or fidgety/restless - - -  Suicidal thoughts - - -  PHQ-9 Score - - -  Difficult doing work/chores - - -       Social History   Tobacco Use  Smoking Status Former   Packs/day: 1.00   Years: 30.00   Pack years: 30.00   Types: Cigarettes   Quit date: 2004   Years since quitting: 18.5  Smokeless Tobacco Never   BP Readings from Last 3 Encounters:  08/11/20 122/62  07/23/20 (!) 154/78  07/17/20 (!) 142/80   Pulse Readings from Last 3 Encounters:  08/11/20 91  07/23/20 93  07/17/20 95   Wt Readings from Last 3 Encounters:  08/11/20 170 lb 9.6 oz (77.4 kg)  07/23/20 170 lb (77.1 kg)  07/17/20 173 lb 3.2 oz (78.6 kg)   BMI Readings from Last 3 Encounters:  08/11/20 30.80 kg/m  07/23/20 30.11 kg/m  07/17/20 32.51 kg/m    Assessment/Interventions: Review of patient past medical history, allergies, medications, health status, including review of consultants reports, laboratory and other test data, was performed as part of comprehensive evaluation and provision of chronic care management services.   SDOH:  (Social Determinants of Health) assessments and interventions performed: No  SDOH Screenings   Alcohol Screen: Not on file  Depression (PHQ2-9): Low Risk    PHQ-2 Score: 0  Financial Resource Strain: Low Risk    Difficulty of Paying Living Expenses: Not hard at all  Food Insecurity: No Food Insecurity   Worried About Charity fundraiser in the Last Year: Never true   Ran Out of Food in the Last Year: Never true  Housing: Not on file  Physical Activity: Inactive   Days of Exercise per Week: 0 days   Minutes of Exercise per Session: 0 min  Social Connections: Not on file  Stress: No Stress Concern Present   Feeling of Stress : Not at all  Tobacco Use: Medium Risk   Smoking Tobacco Use: Former   Smokeless Tobacco Use: Never  Transportation Needs: No Transportation Needs   Lack of Transportation (Medical): No    Lack of Transportation (Non-Medical): No    CCM Care Plan  No Known Allergies  Medications Reviewed Today     Reviewed by Mayford Knife, RPH (Pharmacist) on 10/14/20 at 1144  Med List Status: <None>   Medication Order Taking? Sig Documenting Provider Last Dose Status Informant  ACCU-CHEK FASTCLIX LANCETS [place] [id number] Yes USE TO CHECK BLOOD SUGARS TWICE DAILY Glendale Chard, MD Taking Active   ACCU-CHEK GUIDE  test strip [id number] Yes USE TO CHECK BLOOD SUGARS TWICE DAILY AS DIRECTED Dorothyann Peng, MD Taking Active   Ascorbic Acid (VITAMIN C PO) [id number] Yes Take 1 tablet by mouth daily. [provider] Taking Active   aspirin EC 81 MG tablet [id number] Yes Take 81 mg by mouth daily. [provider] Taking Active   B-D ULTRAFINE III SHORT PEN 31G X 8 MM MISC [id number] Yes USE AS DIRECTED TWICE DAILY Dorothyann Peng, MD Taking Active   Biotin 10 MG CAPS [id number] Yes Take 1 tablet by mouth daily. [provider] Taking Active   Blood Glucose Monitoring Suppl (ACCU-CHEK GUIDE ME) w/Device KIT [id number] Yes USE TO CHECK BLOOD GLUCOSE TWICE DAILY Arnette Felts, FNP Taking Active   calcium carbonate (OSCAL) 1500 (600 Ca) MG TABS tablet [id number] Yes Take 600 mg of elemental calcium by mouth daily with breakfast. [provider] Taking Active Self  Cholecalciferol 125 MCG (5000 UT) TABS [id number] Yes Take 5,000 Units by mouth daily.  [provider] Taking Active   diclofenac Sodium (VOLTAREN) 1 % GEL [id number] Yes APPLY 2 GRAMS TOPICALLY TO THE AFFECTED AREA FOUR TIMES DAILY Dorothyann Peng, MD Taking Active   fluticasone [facility]) 50 MCG/ACT nasal spray [id number] Yes SHAKE LIQUID AND USE 2 SPRAYS IN EACH NOSTRIL DAILY Dorothyann Peng, MD Taking Active   LIVALO 4 MG TABS [id number] Yes TAKE 1 TABLET BY MOUTH DAILY Dorothyann Peng, MD Taking Active   loratadine (CLARITIN) 10 MG tablet [id number] Yes Take 10 mg by mouth daily. AM [provider]  Taking Active   metFORMIN (GLUCOPHAGE) 500 MG tablet [id number] Yes Take 1 tablet (500 mg total) by mouth daily with breakfast. Dinner  Patient taking differently: Take 500 mg by mouth daily with breakfast.   Dorothyann Peng, MD Taking Active   Multiple Vitamins-Minerals (ONE-A-DAY WOMENS 50+ ADVANTAGE PO) [id number] Yes Take 1 tablet by mouth daily. [provider] Taking Active   NON FORMULARY [id number] Yes 2 each. Calm [provider] Taking Active            Med Note Felipa Emory Aug 13, 2020 11:53 AM) Taking 2 at night before bed.   Omega-3 Fatty Acids (FISH OIL PO) [id number] Yes Take by mouth. [provider] Taking Active   omeprazole (PRILOSEC) 40 MG capsule [id number] Yes TAKE 1 CAPSULE BY MOUTH EVERY DAY BEFORE A MEAL Arnette Felts, FNP Taking Active   telmisartan-hydrochlorothiazide (MICARDIS HCT) 40-12.5 MG tablet [id number] Yes TAKE 1 TABLET BY MOUTH DAILY Dorothyann Peng, MD Taking Active   Turmeric 500 MG CAPS [id number] Yes Take 1 capsule by mouth 2 (two) times daily. [provider] Taking Active   XULTOPHY 100-3.6 UNIT-MG/ML Namon Cirri [id number] Yes INJECT 25 UNITS INTO THE SKIN DAILY Dorothyann Peng, MD Taking Active   Med List Note Allyne Gee, Melina Schools, MD 10/12/18 1448): Change Xultophy to 22 units 10/12/2018            Patient Active Problem List   Diagnosis Date Noted   Tachycardia 08/11/2020   Cough 08/11/2020   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   Pure hypercholesterolemia 01/23/2020   Vitamin D deficiency 10/23/2019   Right hip pain 05/15/2018   Estrogen deficiency 05/15/2018   Class 1 obesity due to excess calories with serious comorbidity and body mass index (BMI) of 31.0 to 31.9 in adult 05/15/2018   Diabetes mellitus with stage 2 chronic kidney disease (HCC) 01/11/2018   Benign hypertension with chronic kidney disease, stage II 01/11/2018  Immunization History  Administered Date(s) Administered   Fluad Quad(high Dose  65+) 01/01/2019, 12/27/2019   Influenza, High Dose Seasonal PF 01/12/2018, 01/01/2019   PFIZER(Purple Top)SARS-COV-2 Vaccination 04/26/2019, 05/18/2019, 03/22/2020   Pneumococcal Conjugate-13 10/13/2018   Pneumococcal Polysaccharide-23 03/02/2017   Tdap 09/18/2015   Zoster Recombinat (Shingrix) 11/22/2018, 02/25/2019   Zoster, Live 11/04/2015    Conditions to be addressed/monitored:  Hypertension and Diabetes  Care Plan : [place]  Updates made by Mayford Knife, RPH since 10/14/2020 12:00 AM     Problem: HTN, HLD, DM   Priority: High     Long-Range Goal: Disease Management   Start Date: 07/07/2020  Recent Progress: On track  Priority: High  Note:     Current Barriers:  Unable to independently monitor therapeutic efficacy  Pharmacist Clinical Goal(s):  Patient will achieve adherence to monitoring guidelines and medication adherence to achieve therapeutic efficacy through collaboration with PharmD and provider.   Interventions: 1:1 collaboration with Glendale Chard, MD regarding development and update of comprehensive plan of care as evidenced by provider attestation and co-signature Inter-disciplinary care team collaboration (see longitudinal plan of care) Comprehensive medication review performed; medication list updated in electronic medical record    Hypertension (BP goal <130/80) -Controlled -Current treatment: Telmisartan- Hydrochlorothiazide 40-12.5 mg tablet once per day  -Medications previously tried: Olmesartan - Hydrochlorothiazide 40-12.5 mg   -Current home readings: 140-145/76-78, checking her BP everyday in the evening,  -Current dietary habits: patient reports that she has cut back on fried and fatty foods. She is eating more grilled and baked food. She has starting using Mrs. Dash and she is no longer using the [employer] seasoning.  -Current exercise habits: she is walking her dog 2-3 times per day. She is going to use her SMART TV to was  silver sneakers online.  -Collaborated with patient to show her how to find the silver sneakers channel. -Go to YOUTUBE and type in [employer].   -Denies hypotensive/hypertensive symptoms -Educated on Exercise goal of 150 minutes per week; Importance of home blood pressure monitoring; Proper BP monitoring technique; -Counseled to monitor BP at home at least once per day, document, and provide log at future appointments -Counseled on diet and exercise extensively Recommended to continue current medication   Diabetes (A1c goal <7%) -Uncontrolled -Current medications: Metformin 500 mg tablet once per day  Xultophy 100-3.6 UNIT -MG/ML - inject 25 units into the skin once daily  -Current home glucose readings fasting glucose: 100 post prandial glucose: 137 - 140  -Denies hypoglycemic/hyperglycemic symptoms -Current meal patterns:  breakfast: grilled cheese sandwich, or juice and a boiled egg, or oatmeal   lunch: whatever is left from dinner and broccoli dinner: patient reports she usually whips something together sometimes she is using canned vegetables but she rinses them off snacks: krispy kreme donuts once a week - at least 3, she gets a dozen and she gives some to her siblings and has the rest. She is working on eating more fruits  drinks: increasing her water.  -Current exercise: please see hyperlipidemia above -Educated on A1c and blood sugar goals; Carbohydrate counting and/or plate method -Counseled to check feet daily and get yearly eye exams -Counseled on diet and exercise extensively Recommended to continue current medication Collaborated with PCP to determine if patient is a candidate for the addition of Zetia due her moderate increase in Triglycerides.   Patient Goals/Self-Care Activities Patient will:  - take medications as prescribed  Follow Up Plan: The patient has  been provided with contact information for the care management team and has been advised to call  with any health related questions or concerns.       Medication Assistance: None required.  Patient affirms current coverage meets needs.  Compliance/Adherence/Medication fill history: Care Gaps: COVID-19 Vaccine Booster  Star-Rating Drugs: Livalo 4 mg tablet  Telmisartan 40 mg  Metformin 500 mg  Xultophy 100-3.6 UNIT -MG/ML SOPN  Patient's preferred pharmacy is:  [facility] [place], [address] [place] [address] Phone: [phone] Fax: [phone]  Uses pill box? Yes Pt endorses 90% compliance  We discussed: Benefits of medication synchronization, packaging and delivery as well as enhanced pharmacist oversight with [employer]. Patient decided to: Continue current medication management strategy  Care Plan and Follow Up Patient Decision:  Patient agrees to Care Plan and Follow-up.  Plan: The patient has been provided with contact information for the care management team and has been advised to call with any health related questions or concerns.   Orlando Penner, PharmD Clinical Pharmacist [facility] [phone]

## 2020-10-24 ENCOUNTER — Other Ambulatory Visit: Payer: Self-pay | Admitting: Internal Medicine

## 2020-10-27 DIAGNOSIS — G4733 Obstructive sleep apnea (adult) (pediatric): Secondary | ICD-10-CM | POA: Diagnosis not present

## 2020-10-28 ENCOUNTER — Encounter: Payer: Medicare Other | Admitting: Internal Medicine

## 2020-11-10 ENCOUNTER — Ambulatory Visit (INDEPENDENT_AMBULATORY_CARE_PROVIDER_SITE_OTHER): Payer: Medicare Other | Admitting: Internal Medicine

## 2020-11-10 ENCOUNTER — Encounter: Payer: Self-pay | Admitting: Internal Medicine

## 2020-11-10 ENCOUNTER — Telehealth: Payer: Self-pay | Admitting: Neurology

## 2020-11-10 ENCOUNTER — Other Ambulatory Visit: Payer: Self-pay

## 2020-11-10 VITALS — BP 140/70 | HR 88 | Temp 97.7°F | Ht 61.6 in | Wt 170.4 lb

## 2020-11-10 DIAGNOSIS — N182 Chronic kidney disease, stage 2 (mild): Secondary | ICD-10-CM | POA: Diagnosis not present

## 2020-11-10 DIAGNOSIS — E6609 Other obesity due to excess calories: Secondary | ICD-10-CM

## 2020-11-10 DIAGNOSIS — R0989 Other specified symptoms and signs involving the circulatory and respiratory systems: Secondary | ICD-10-CM

## 2020-11-10 DIAGNOSIS — Z87891 Personal history of nicotine dependence: Secondary | ICD-10-CM

## 2020-11-10 DIAGNOSIS — E7800 Pure hypercholesterolemia, unspecified: Secondary | ICD-10-CM

## 2020-11-10 DIAGNOSIS — E559 Vitamin D deficiency, unspecified: Secondary | ICD-10-CM

## 2020-11-10 DIAGNOSIS — I129 Hypertensive chronic kidney disease with stage 1 through stage 4 chronic kidney disease, or unspecified chronic kidney disease: Secondary | ICD-10-CM

## 2020-11-10 DIAGNOSIS — Z6831 Body mass index (BMI) 31.0-31.9, adult: Secondary | ICD-10-CM

## 2020-11-10 DIAGNOSIS — Z0000 Encounter for general adult medical examination without abnormal findings: Secondary | ICD-10-CM

## 2020-11-10 DIAGNOSIS — E1122 Type 2 diabetes mellitus with diabetic chronic kidney disease: Secondary | ICD-10-CM | POA: Diagnosis not present

## 2020-11-10 LAB — POCT URINALYSIS DIPSTICK
Bilirubin, UA: NEGATIVE
Blood, UA: NEGATIVE
Glucose, UA: NEGATIVE
Ketones, UA: NEGATIVE
Nitrite, UA: NEGATIVE
Protein, UA: NEGATIVE
Spec Grav, UA: 1.02 (ref 1.010–1.025)
Urobilinogen, UA: 0.2 E.U./dL
pH, UA: 6 (ref 5.0–8.0)

## 2020-11-10 LAB — POCT UA - MICROALBUMIN
Albumin/Creatinine Ratio, Urine, POC: 30
Creatinine, POC: 300 mg/dL
Microalbumin Ur, POC: 10 mg/L

## 2020-11-10 NOTE — Telephone Encounter (Signed)
Order was already sent to [facility] at [facility]. I spoke with the patient and gave her Pedro Earls number for her to call. She was very Patent attorney.

## 2020-11-10 NOTE — Patient Instructions (Signed)
Health Maintenance, Female Adopting a healthy lifestyle and getting preventive care are important in promoting health and wellness. Ask your health care provider about: The right schedule for you to have regular tests and exams. Things you can do on your own to prevent diseases and keep yourself healthy. What should I know about diet, weight, and exercise? Eat a healthy diet  Eat a diet that includes plenty of vegetables, fruits, low-fat dairy products, and lean protein. Do not eat a lot of foods that are high in solid fats, added sugars, or sodium.  Maintain a healthy weight Body mass index (BMI) is used to identify weight problems. It estimates body fat based on height and weight. Your health care provider can help determineyour BMI and help you achieve or maintain a healthy weight. Get regular exercise Get regular exercise. This is one of the most important things you can do for your health. Most adults should: Exercise for at least 150 minutes each week. The exercise should increase your heart rate and make you sweat (moderate-intensity exercise). Do strengthening exercises at least twice a week. This is in addition to the moderate-intensity exercise. Spend less time sitting. Even light physical activity can be beneficial. Watch cholesterol and blood lipids Have your blood tested for lipids and cholesterol at 69 years of age, then havethis test every 5 years. Have your cholesterol levels checked more often if: Your lipid or cholesterol levels are high. You are older than 69 years of age. You are at high risk for heart disease. What should I know about cancer screening? Depending on your health history and family history, you may need to have cancer screening at various ages. This may include screening for: Breast cancer. Cervical cancer. Colorectal cancer. Skin cancer. Lung cancer. What should I know about heart disease, diabetes, and high blood pressure? Blood pressure and heart  disease High blood pressure causes heart disease and increases the risk of stroke. This is more likely to develop in people who have high blood pressure readings, are of African descent, or are overweight. Have your blood pressure checked: Every 3-5 years if you are 18-39 years of age. Every year if you are 40 years old or older. Diabetes Have regular diabetes screenings. This checks your fasting blood sugar level. Have the screening done: Once every three years after age 40 if you are at a normal weight and have a low risk for diabetes. More often and at a younger age if you are overweight or have a high risk for diabetes. What should I know about preventing infection? Hepatitis B If you have a higher risk for hepatitis B, you should be screened for this virus. Talk with your health care provider to find out if you are at risk forhepatitis B infection. Hepatitis C Testing is recommended for: Everyone born from 1945 through 1965. Anyone with known risk factors for hepatitis C. Sexually transmitted infections (STIs) Get screened for STIs, including gonorrhea and chlamydia, if: You are sexually active and are younger than 69 years of age. You are older than 69 years of age and your health care provider tells you that you are at risk for this type of infection. Your sexual activity has changed since you were last screened, and you are at increased risk for chlamydia or gonorrhea. Ask your health care provider if you are at risk. Ask your health care provider about whether you are at high risk for HIV. Your health care provider may recommend a prescription medicine to help   prevent HIV infection. If you choose to take medicine to prevent HIV, you should first get tested for HIV. You should then be tested every 3 months for as long as you are taking the medicine. Pregnancy If you are about to stop having your period (premenopausal) and you may become pregnant, seek counseling before you get  pregnant. Take 400 to 800 micrograms (mcg) of folic acid every day if you become pregnant. Ask for birth control (contraception) if you want to prevent pregnancy. Osteoporosis and menopause Osteoporosis is a disease in which the bones lose minerals and strength with aging. This can result in bone fractures. If you are 65 years old or older, or if you are at risk for osteoporosis and fractures, ask your health care provider if you should: Be screened for bone loss. Take a calcium or vitamin D supplement to lower your risk of fractures. Be given hormone replacement therapy (HRT) to treat symptoms of menopause. Follow these instructions at home: Lifestyle Do not use any products that contain nicotine or tobacco, such as cigarettes, e-cigarettes, and chewing tobacco. If you need help quitting, ask your health care provider. Do not use street drugs. Do not share needles. Ask your health care provider for help if you need support or information about quitting drugs. Alcohol use Do not drink alcohol if: Your health care provider tells you not to drink. You are pregnant, may be pregnant, or are planning to become pregnant. If you drink alcohol: Limit how much you use to 0-1 drink a day. Limit intake if you are breastfeeding. Be aware of how much alcohol is in your drink. In the [place] one drink equals one 12 oz bottle of beer (355 mL), one 5 oz glass of wine (148 mL), or one 1 oz glass of hard liquor (44 mL). General instructions Schedule regular health, dental, and eye exams. Stay current with your vaccines. Tell your health care provider if: You often feel depressed. You have ever been abused or do not feel safe at home. Summary Adopting a healthy lifestyle and getting preventive care are important in promoting health and wellness. Follow your health care provider's instructions about healthy diet, exercising, and getting tested or screened for diseases. Follow your health care provider's  instructions on monitoring your cholesterol and blood pressure. This information is not intended to replace advice given to you by your health care provider. Make sure you discuss any questions you have with your healthcare provider. Document Revised: 03/15/2018 Document Reviewed: 03/15/2018 Elsevier Patient Education  2022 Elsevier Inc.  

## 2020-11-10 NOTE — Telephone Encounter (Signed)
Pt called, received a letter from the insurance ;Dat Scan has be approved. Would like a call from the nurse to discuss when can schedule the Dat Scan.

## 2020-11-10 NOTE — Progress Notes (Signed)
I,Yamilka [employer] as a Education administrator for Maximino Greenland, MD.,have documented all relevant documentation on the behalf of Maximino Greenland, MD,as directed by  Maximino Greenland, MD while in the presence of Maximino Greenland, MD.  This visit occurred during the SARS-CoV-2 public health emergency.  Safety protocols were in place, including screening questions prior to the visit, additional usage of staff PPE, and extensive cleaning of exam room while observing appropriate contact time as indicated for disinfecting solutions.  Subjective:     Patient ID: April Larson , female    DOB: 1950-06-21 , 69 y.o.   MRN: [id number]   Chief Complaint  Patient presents with   Annual Exam   Diabetes   Hypertension    HPI  The patient is here today for a physical examination. She is followed by GYN in [place]. She can't recall her last visit. She has no specific concerns or complaints at this time.   Diabetes She presents for her follow-up diabetic visit. She has type 2 diabetes mellitus. There are no hypoglycemic associated symptoms. Pertinent negatives for hypoglycemia include no headaches. Pertinent negatives for diabetes include no blurred vision and no chest pain. There are no hypoglycemic complications. Risk factors for coronary artery disease include diabetes mellitus, dyslipidemia, hypertension, post-menopausal and sedentary lifestyle. Current diabetic treatment includes insulin injections. She is compliant with treatment most of the time. Her weight is fluctuating minimally. She is following a diabetic diet. She participates in exercise intermittently. Her home blood glucose trend is fluctuating minimally. Her breakfast blood glucose is taken between 8-9 am. Her breakfast blood glucose range is generally 70-90 mg/dl. An ACE inhibitor/angiotensin II receptor blocker is being taken.  Hypertension This is a chronic problem. The current episode started more than 1 year ago. The problem is  controlled (FAIR CONTROL). Pertinent negatives include no anxiety, blurred vision, chest pain or headaches. The current treatment provides moderate improvement. Compliance problems include exercise.  Hypertensive end-organ damage includes kidney disease.    Past Medical History:  Diagnosis Date   Diabetes mellitus without complication ([place])    Hypertension    Substance abuse ([place])      Family History  Problem Relation Age of Onset   Cancer Mother    Diabetes Father    Cancer Brother    Breast cancer Maternal Aunt      Current Outpatient Medications:    ACCU-CHEK FASTCLIX LANCETS MISC, USE TO CHECK BLOOD SUGARS TWICE DAILY, Disp: 100 each, Rfl: 11   ACCU-CHEK GUIDE test strip, USE TO CHECK BLOOD SUGARS TWICE DAILY AS DIRECTED, Disp: 100 strip, Rfl: 2   Ascorbic Acid (VITAMIN C PO), Take 1 tablet by mouth daily., Disp: , Rfl:    aspirin EC 81 MG tablet, Take 81 mg by mouth daily., Disp: , Rfl:    B-D ULTRAFINE III SHORT PEN 31G X 8 MM MISC, USE AS DIRECTED TWICE DAILY, Disp: 100 each, Rfl: 11   Biotin 10 MG CAPS, Take 1 tablet by mouth daily., Disp: , Rfl:    Blood Glucose Monitoring Suppl (ACCU-CHEK GUIDE ME) w/Device KIT, USE TO CHECK BLOOD GLUCOSE TWICE DAILY, Disp: 3 kit, Rfl: 3   calcium carbonate (OSCAL) 1500 (600 Ca) MG TABS tablet, Take 600 mg of elemental calcium by mouth daily with breakfast., Disp: , Rfl:    Cholecalciferol 125 MCG (5000 UT) TABS, Take 5,000 Units by mouth daily. , Disp: , Rfl:    diclofenac Sodium (VOLTAREN) 1 % GEL, APPLY 2 GRAMS  TOPICALLY TO THE AFFECTED AREA FOUR TIMES DAILY, Disp: 100 g, Rfl: 2   fluticasone (FLONASE) 50 MCG/ACT nasal spray, SHAKE LIQUID AND USE 2 SPRAYS IN EACH NOSTRIL DAILY, Disp: 16 g, Rfl: 2   LIVALO 4 MG TABS, TAKE 1 TABLET BY MOUTH DAILY, Disp: 90 tablet, Rfl: 1   loratadine (CLARITIN) 10 MG tablet, Take 10 mg by mouth daily. AM, Disp: , Rfl:    metFORMIN (GLUCOPHAGE) 500 MG tablet, Take 1 tablet (500 mg total) by mouth daily with  breakfast. Dinner (Patient taking differently: Take 500 mg by mouth daily with breakfast.), Disp: 180 tablet, Rfl: 3   Multiple Vitamins-Minerals (ONE-A-DAY WOMENS 50+ ADVANTAGE PO), Take 1 tablet by mouth daily., Disp: , Rfl:    NON FORMULARY, 2 each. Calm, Disp: , Rfl:    Omega-3 Fatty Acids (FISH OIL PO), Take by mouth., Disp: , Rfl:    omeprazole (PRILOSEC) 40 MG capsule, TAKE 1 CAPSULE BY MOUTH EVERY DAY BEFORE A MEAL, Disp: 90 capsule, Rfl: 1   pioglitazone (ACTOS) 15 MG tablet, TAKE 1 TABLET BY MOUTH DAILY, Disp: 90 tablet, Rfl: 1   telmisartan-hydrochlorothiazide (MICARDIS HCT) 40-12.5 MG tablet, TAKE 1 TABLET BY MOUTH DAILY, Disp: 90 tablet, Rfl: 1   Turmeric 500 MG CAPS, Take 1 capsule by mouth 2 (two) times daily., Disp: , Rfl:    XULTOPHY 100-3.6 UNIT-MG/ML SOPN, INJECT 25 UNITS SUBCUTANEOUSLY DAILY, Disp: 27 mL, Rfl: 3   No Known Allergies    The patient states she uses post menopausal status for birth control. Last LMP was No LMP recorded. Patient is postmenopausal.. Negative for Dysmenorrhea. Negative for: breast discharge, breast lump(s), breast pain and breast self exam. Associated symptoms include abnormal vaginal bleeding. Pertinent negatives include abnormal bleeding (hematology), anxiety, decreased libido, depression, difficulty falling sleep, dyspareunia, history of infertility, nocturia, sexual dysfunction, sleep disturbances, urinary incontinence, urinary urgency, vaginal discharge and vaginal itching. Diet regular.The patient states her exercise level is  intermittent.  . The patient's tobacco use is:  Social History   Tobacco Use  Smoking Status Former   Packs/day: 1.00   Years: 34.00   Pack years: 34.00   Types: Cigarettes   Start date: 75   Quit date: 04/05/2002   Years since quitting: 18.6  Smokeless Tobacco Never  . She has been exposed to passive smoke. The patient's alcohol use is:  Social History   Substance and Sexual Activity  Alcohol Use No    Alcohol/week: 0.0 standard drinks   Review of Systems  Constitutional: Negative.   HENT: Negative.    Eyes: Negative.  Negative for blurred vision.  Respiratory: Negative.    Cardiovascular: Negative.  Negative for chest pain.  Gastrointestinal: Negative.   Endocrine: Negative.   Genitourinary: Negative.   Musculoskeletal: Negative.   Skin: Negative.   Allergic/Immunologic: Negative.   Neurological: Negative.  Negative for headaches.  Hematological: Negative.   Psychiatric/Behavioral: Negative.      Today's Vitals   11/10/20 1112  BP: 140/70  Pulse: 88  Temp: 97.7 F (36.5 C)  Weight: 170 lb 6.4 oz (77.3 kg)  Height: 5' 1.6" (1.565 m)  PainSc: 0-No pain   Body mass index is 31.57 kg/m.  Wt Readings from Last 3 Encounters:  11/10/20 170 lb 6.4 oz (77.3 kg)  08/11/20 170 lb 9.6 oz (77.4 kg)  07/23/20 170 lb (77.1 kg)     Objective:  Physical Exam Vitals and nursing note reviewed.  Constitutional:      Appearance: Normal appearance.  HENT:     Head: Normocephalic and atraumatic.     Right Ear: Tympanic membrane, ear canal and external ear normal.     Left Ear: Tympanic membrane, ear canal and external ear normal.     Nose:     Comments: Masked     Mouth/Throat:     Comments: Masked  Eyes:     Extraocular Movements: Extraocular movements intact.     Conjunctiva/sclera: Conjunctivae normal.     Pupils: Pupils are equal, round, and reactive to light.  Cardiovascular:     Rate and Rhythm: Normal rate and regular rhythm.     Pulses:          Dorsalis pedis pulses are 1+ on the right side and 1+ on the left side.     Heart sounds: Normal heart sounds.  Pulmonary:     Effort: Pulmonary effort is normal.     Breath sounds: Normal breath sounds.  Abdominal:     General: Bowel sounds are normal.     Palpations: Abdomen is soft.  Genitourinary:    Comments: deferred Musculoskeletal:        General: Normal range of motion.     Cervical back: Normal range of motion  and neck supple.  Feet:     Right foot:     Protective Sensation: 5 sites tested.  5 sites sensed.     Skin integrity: Callus and dry skin present.     Toenail Condition: Right toenails are normal.     Left foot:     Protective Sensation: 5 sites tested.  5 sites sensed.     Skin integrity: Dry skin present.     Toenail Condition: Left toenails are normal.  Skin:    General: Skin is warm and dry.  Neurological:     General: No focal deficit present.     Mental Status: She is alert and oriented to person, place, and time.  Psychiatric:        Mood and Affect: Mood normal.        Behavior: Behavior normal.        Assessment And Plan:     1. Encounter for general adult medical examination w/o abnormal findings Comments: A full exam was performed. Importance of monthly self bresat exams was d/w pt. PATIENT IS ADVISED TO GET 30-45 MINUTES REGULAR EXERCISE NO LESS THAN FOUR TO FIVE DAYS PER WEEK - BOTH WEIGHTBEARING EXERCISES AND AEROBIC ARE RECOMMENDED.  PATIENT IS ADVISED TO FOLLOW A HEALTHY DIET WITH AT LEAST SIX FRUITS/VEGGIES PER DAY, DECREASE INTAKE OF RED MEAT, AND TO INCREASE FISH INTAKE TO TWO DAYS PER WEEK.  MEATS/FISH SHOULD NOT BE FRIED, BAKED OR BROILED IS PREFERABLE.  IT IS ALSO IMPORTANT TO CUT BACK ON YOUR SUGAR INTAKE. PLEASE AVOID ANYTHING WITH ADDED SUGAR, CORN SYRUP OR OTHER SWEETENERS. IF YOU MUST USE A SWEETENER, YOU CAN TRY STEVIA. IT IS ALSO IMPORTANT TO AVOID ARTIFICIALLY SWEETENERS AND DIET BEVERAGES. LASTLY, I SUGGEST WEARING SPF 50 SUNSCREEN ON EXPOSED PARTS AND ESPECIALLY WHEN IN THE DIRECT SUNLIGHT FOR AN EXTENDED PERIOD OF TIME.  PLEASE AVOID FAST FOOD RESTAURANTS AND INCREASE YOUR WATER INTAKE.  - CBC  2. Diabetes mellitus with stage 2 chronic kidney disease ([place]) Comments: Diabetic foot exam was performed. Importance of dietary, medication and exercise compliance was d/w pt. I DISCUSSED WITH THE PATIENT AT LENGTH REGARDING THE GOALS OF GLYCEMIC CONTROL AND  POSSIBLE LONG-TERM COMPLICATIONS.  I  ALSO STRESSED THE IMPORTANCE OF COMPLIANCE WITH  HOME GLUCOSE MONITORING, DIETARY RESTRICTIONS INCLUDING AVOIDANCE OF SUGARY DRINKS/PROCESSED FOODS,  ALONG WITH REGULAR EXERCISE.  I  ALSO STRESSED THE IMPORTANCE OF ANNUAL EYE EXAMS, SELF FOOT CARE AND COMPLIANCE WITH OFFICE VISITS.  - Hemoglobin A1c - POCT Urinalysis Dipstick (81002) - POCT UA - Microalbumin  3. Hypertensive nephropathy Comments: Chronic, fair control. EKG not performed, results from Jan 2022 reviewed, NSR wo acute changes. No med changes today. Advised to follow low sodium diet.  - CMP14+EGFR  4. Decreased dorsalis pedis pulse - VAS [place] ABI WITH/WO TBI; Future  5. Class 1 obesity due to excess calories with serious comorbidity and body mass index (BMI) of 31.0 to 31.9 in adult Comments: She is encouraged to strive to lose 7-10 pounds to decrease cardiac risk. Advised to aim for at least 150 minutes of exercise per week.   6. History of tobacco use Comments: She does not qualify for low dose CT chest for lung CA screening b/c she quit more than 15 years ago.  Patient was given opportunity to ask questions. Patient verbalized understanding of the plan and was able to repeat key elements of the plan. All questions were answered to their satisfaction.   I, Maximino Greenland, MD, have reviewed all documentation for this visit. The documentation on 11/17/20 for the exam, diagnosis, procedures, and orders are all accurate and complete.  THE PATIENT IS ENCOURAGED TO PRACTICE SOCIAL DISTANCING DUE TO THE COVID-19 PANDEMIC.

## 2020-11-12 ENCOUNTER — Telehealth: Payer: Medicare Other

## 2020-11-17 DIAGNOSIS — R0989 Other specified symptoms and signs involving the circulatory and respiratory systems: Secondary | ICD-10-CM | POA: Insufficient documentation

## 2020-11-19 ENCOUNTER — Ambulatory Visit (HOSPITAL_COMMUNITY)
Admission: RE | Admit: 2020-11-19 | Discharge: 2020-11-19 | Disposition: A | Discharge status: Home / Self Care | Payer: Medicare Other | Source: Ambulatory Visit | Attending: Internal Medicine | Admitting: Internal Medicine

## 2020-11-19 ENCOUNTER — Other Ambulatory Visit: Payer: Self-pay

## 2020-11-19 DIAGNOSIS — R0989 Other specified symptoms and signs involving the circulatory and respiratory systems: Secondary | ICD-10-CM

## 2020-11-27 ENCOUNTER — Telehealth: Payer: Medicare Other

## 2020-11-27 ENCOUNTER — Ambulatory Visit (INDEPENDENT_AMBULATORY_CARE_PROVIDER_SITE_OTHER): Payer: Medicare Other

## 2020-11-27 DIAGNOSIS — E7800 Pure hypercholesterolemia, unspecified: Secondary | ICD-10-CM

## 2020-11-27 DIAGNOSIS — I129 Hypertensive chronic kidney disease with stage 1 through stage 4 chronic kidney disease, or unspecified chronic kidney disease: Secondary | ICD-10-CM

## 2020-11-27 DIAGNOSIS — E1122 Type 2 diabetes mellitus with diabetic chronic kidney disease: Secondary | ICD-10-CM

## 2020-11-27 LAB — CBC
Hematocrit: 39.5 % (ref 34.0–46.6)
Hemoglobin: 12.7 g/dL (ref 11.1–15.9)
MCH: 26.8 pg (ref 26.6–33.0)
MCHC: 32.2 g/dL (ref 31.5–35.7)
MCV: 84 fL (ref 79–97)
Platelets: 362 10*3/uL (ref 150–450)
RBC: 4.73 x10E6/uL (ref 3.77–5.28)
RDW: 14 % (ref 11.7–15.4)
WBC: 8.5 10*3/uL (ref 3.4–10.8)

## 2020-11-27 LAB — CMP14+EGFR

## 2020-11-27 LAB — HEMOGLOBIN A1C
Est. average glucose Bld gHb Est-mCnc: 154 mg/dL
Hgb A1c MFr Bld: 7 % — ABNORMAL HIGH (ref 4.8–5.6)

## 2020-12-02 DIAGNOSIS — H53483 Generalized contraction of visual field, bilateral: Secondary | ICD-10-CM | POA: Diagnosis not present

## 2020-12-02 DIAGNOSIS — H5000 Unspecified esotropia: Secondary | ICD-10-CM | POA: Diagnosis not present

## 2020-12-02 LAB — HM DIABETES EYE EXAM

## 2020-12-03 DIAGNOSIS — E1122 Type 2 diabetes mellitus with diabetic chronic kidney disease: Secondary | ICD-10-CM | POA: Diagnosis not present

## 2020-12-03 DIAGNOSIS — N182 Chronic kidney disease, stage 2 (mild): Secondary | ICD-10-CM | POA: Diagnosis not present

## 2020-12-03 DIAGNOSIS — I129 Hypertensive chronic kidney disease with stage 1 through stage 4 chronic kidney disease, or unspecified chronic kidney disease: Secondary | ICD-10-CM | POA: Diagnosis not present

## 2020-12-03 DIAGNOSIS — E7800 Pure hypercholesterolemia, unspecified: Secondary | ICD-10-CM

## 2020-12-04 NOTE — Telephone Encounter (Signed)
Museum/gallery conservator at [employer] reached out regarding [employer]. Patient was not able to be scheduled in the insurance authorization window. Patient is scheduled 9/6. Auth expired 8/20. I have reached out to insurance to see if we can get [employer] approved again.

## 2020-12-05 ENCOUNTER — Encounter: Payer: Self-pay | Admitting: Internal Medicine

## 2020-12-09 ENCOUNTER — Ambulatory Visit (HOSPITAL_COMMUNITY): Admission: RE | Admit: 2020-12-09 | Payer: Medicare Other | Source: Ambulatory Visit

## 2020-12-09 ENCOUNTER — Encounter (HOSPITAL_COMMUNITY): Payer: Medicare Other

## 2020-12-09 NOTE — Chronic Care Management (AMB) (Signed)
Chronic Care Management   CCM RN Visit Note  11/27/2020 Name: April Larson MRN: [id number] DOB: 1950/10/12  Subjective: April Larson is a 70 y.o. year old female who is a primary care patient of Glendale Chard, MD. The care management team was consulted for assistance with disease management and care coordination needs.    Engaged with patient by telephone for follow up visit in response to provider referral for case management and/or care coordination services.   Consent to Services:  The patient was given information about Chronic Care Management services, agreed to services, and gave verbal consent prior to initiation of services.  Please see initial visit note for detailed documentation.   Patient agreed to services and verbal consent obtained.   Assessment: Review of patient past medical history, allergies, medications, health status, including review of consultants reports, laboratory and other test data, was performed as part of comprehensive evaluation and provision of chronic care management services.   SDOH (Social Determinants of Health) assessments and interventions performed:  Yes, no acute changes   CCM Care Plan  No Known Allergies  Outpatient Encounter Medications as of 11/27/2020  Medication Sig Note   ACCU-CHEK FASTCLIX LANCETS MISC USE TO CHECK BLOOD SUGARS TWICE DAILY    ACCU-CHEK GUIDE test strip USE TO CHECK BLOOD SUGARS TWICE DAILY AS DIRECTED    Ascorbic Acid (VITAMIN C PO) Take 1 tablet by mouth daily.    aspirin EC 81 MG tablet Take 81 mg by mouth daily.    B-D ULTRAFINE III SHORT PEN 31G X 8 MM MISC USE AS DIRECTED TWICE DAILY    Biotin 10 MG CAPS Take 1 tablet by mouth daily.    Blood Glucose Monitoring Suppl (ACCU-CHEK GUIDE ME) w/Device KIT USE TO CHECK BLOOD GLUCOSE TWICE DAILY    calcium carbonate (OSCAL) 1500 (600 Ca) MG TABS tablet Take 600 mg of elemental calcium by mouth daily with breakfast.    Cholecalciferol  125 MCG (5000 UT) TABS Take 5,000 Units by mouth daily.     diclofenac Sodium (VOLTAREN) 1 % GEL APPLY 2 GRAMS TOPICALLY TO THE AFFECTED AREA FOUR TIMES DAILY    fluticasone (FLONASE) 50 MCG/ACT nasal spray SHAKE LIQUID AND USE 2 SPRAYS IN EACH NOSTRIL DAILY    LIVALO 4 MG TABS TAKE 1 TABLET BY MOUTH DAILY    loratadine (CLARITIN) 10 MG tablet Take 10 mg by mouth daily. AM    metFORMIN (GLUCOPHAGE) 500 MG tablet Take 1 tablet (500 mg total) by mouth daily with breakfast. Dinner (Patient taking differently: Take 500 mg by mouth daily with breakfast.)    Multiple Vitamins-Minerals (ONE-A-DAY WOMENS 50+ ADVANTAGE PO) Take 1 tablet by mouth daily.    NON FORMULARY 2 each. Calm 08/13/2020: Taking 2 at night before bed.    Omega-3 Fatty Acids (FISH OIL PO) Take by mouth.    omeprazole (PRILOSEC) 40 MG capsule TAKE 1 CAPSULE BY MOUTH EVERY DAY BEFORE A MEAL    pioglitazone (ACTOS) 15 MG tablet TAKE 1 TABLET BY MOUTH DAILY    telmisartan-hydrochlorothiazide (MICARDIS HCT) 40-12.5 MG tablet TAKE 1 TABLET BY MOUTH DAILY    Turmeric 500 MG CAPS Take 1 capsule by mouth 2 (two) times daily.    XULTOPHY 100-3.6 UNIT-MG/ML SOPN INJECT 25 UNITS SUBCUTANEOUSLY DAILY    No facility-administered encounter medications on file as of 11/27/2020.    Patient Active Problem List   Diagnosis Date Noted   Decreased dorsalis pedis pulse 11/17/2020   Tachycardia 08/11/2020  Cough 08/11/2020   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   Pure hypercholesterolemia 01/23/2020   Vitamin D deficiency 10/23/2019   Right hip pain 05/15/2018   Estrogen deficiency 05/15/2018   Class 1 obesity due to excess calories with serious comorbidity and body mass index (BMI) of 31.0 to 31.9 in adult 05/15/2018   Diabetes mellitus with stage 2 chronic kidney disease ([place]) 01/11/2018   Benign hypertension with chronic kidney disease, stage II 01/11/2018    Conditions to be addressed/monitored: DM II with stage 2 CKD, Hypertensive  Nephropathy, Pure Hypercholesterolemia   Care Plan : Diabetes Type 2 (Adult)  Updates made by Lynne Logan, RN since 11/27/2020 12:00 AM     Problem: Glycemic Management (Diabetes, Type 2)   Priority: Medium     Long-Range Goal: Glycemic Management Optimized   Start Date: 06/23/2020  Expected End Date: 06/23/2021  Recent Progress: On track  Priority: Medium  Note:   Objective:  Lab Results  Component Value Date   HGBA1C 7.0 (H) 11/10/2020   Lab Results  Component Value Date   CREATININE CANCELED 11/10/2020   CREATININE 0.82 07/23/2020   CREATININE 0.91 04/15/2020   Lab Results  Component Value Date   EGFR 78 07/23/2020    Current Barriers:  Knowledge Deficits related to basic Diabetes pathophysiology and self care/management Knowledge Deficits related to medications used for management of diabetes Case Manager Clinical Goal(s):  patient will demonstrate improved adherence to prescribed treatment plan for diabetes self care/management as evidenced by: daily monitoring and recording of CBG  adherence to ADA/ carb modified diet exercise 5 days/week adherence to prescribed medication regimen contacting provider for new or worsened symptoms or questions Interventions:  11/27/20 completed successful outbound call with patient  Collaboration with Glendale Chard, MD regarding development and update of comprehensive plan of care as evidenced by provider attestation and co-signature Inter-disciplinary care team collaboration (see longitudinal plan of care) Provided education to patient about basic DM disease process Review of patient status, including review of consultants reports, relevant laboratory and other test results, and medications completed. Reviewed medications with patient and discussed importance of medication adherence Reviewed scheduled/upcoming provider appointments including: PCP follow up scheduled for 02/16/21 $RemoveBefo'@3'[id number]$ :45 PM  Advised patient, providing education and  rationale, to check cbg before meals and at bedtime and record, calling the CCM team and or PCP for findings outside established parameters.  Mailed printed educational materials related to Diabetes Management   Discussed plans with patient for ongoing care management follow up and provided patient with direct contact information for care management team Self-Care Activities Self administers oral medications as prescribed Attends all scheduled provider appointments Checks blood sugars as prescribed and utilize hyper and hypoglycemia protocol as needed Adheres to prescribed ADA/carb modified Patient Goals: - check blood sugar at prescribed times - check blood sugar before and after exercise - check blood sugar if I feel it is too high or too low - enter blood sugar readings and medication or insulin into daily log - take the blood sugar log to all doctor visits - take the blood sugar meter to all doctor visits  Follow Up Plan: Telephone follow up appointment with care management team member scheduled for: 02/23/21    Care Plan : Hypertension (Adult)  Updates made by Lynne Logan, RN since 11/27/2020 12:00 AM     Problem: Hypertension (Hypertension)   Priority: Medium     Long-Range Goal: Hypertension Monitored   Start Date: 06/23/2020  Expected End Date: 06/23/2021  Recent  Progress: On track  Priority: Medium  Note:   CARE PLAN ENTRY (see longtitudinal plan of care for additional care plan information)  Objective:  Last practice recorded BP readings:  BP Readings from Last 3 Encounters:  11/10/20 140/70  08/11/20 122/62  07/23/20 (!) 154/78   Most recent eGFR/CrCl:  Lab Results  Component Value Date   EGFR 78 07/23/2020    No components found for: CRCL Current Barriers:  Knowledge Deficits related to basic understanding of hypertension pathophysiology and self care management Knowledge Deficits related to understanding of medications prescribed for management of  hypertension Case Manager Clinical Goal(s):  patient will demonstrate improved adherence to prescribed treatment plan for hypertension as evidenced by taking all medications as prescribed, monitoring and recording blood pressure as directed, adhering to low sodium/DASH diet Interventions:  11/27/20 completed successful outbound call to patient  Collaboration with Glendale Chard, MD regarding development and update of comprehensive plan of care as evidenced by provider attestation and co-signature Inter-disciplinary care team collaboration (see longitudinal plan of care) Evaluation of current treatment plan related to hypertension self management and patient's adherence to plan as established by provider. Provided education to patient re: stroke prevention, s/s of heart attack and stroke, DASH diet, complications of uncontrolled blood pressure Reviewed medications with patient and discussed importance of compliance Advised patient, providing education and rationale, to monitor blood pressure daily and record, calling PCP for findings outside established parameters.  Mailed printed educational materials related to Hypertension management  Discussed plans with patient for ongoing care management follow up and provided patient with direct contact information for care management team Self-Care Activities: Self administers medications as prescribed Attends all scheduled provider appointments Calls provider office for new concerns, questions, or BP outside discussed parameters Checks BP and records as discussed Follows a low sodium diet/DASH diet Patient Goals: - check blood pressure daily - write blood pressure results in a log or diary  Follow Up Plan: Telephone follow up appointment with care management team member scheduled for: 02/23/21    Care Plan : Elevated Triglycerides  Updates made by Lynne Logan, RN since 11/27/2020 12:00 AM     Problem: Elevated Triglycerides   Priority: High      Long-Range Goal: Hypertriglyceridemia - complications minimized or prevented   Start Date: 08/13/2020  Expected End Date: 08/13/2021  Recent Progress: On track  Priority: High  Note:   Current Barriers:  Ineffective Self Health Maintenance  Clinical Goal(s):  Collaboration with Glendale Chard, MD regarding development and update of comprehensive plan of care as evidenced by provider attestation and co-signature Inter-disciplinary care team collaboration (see longitudinal plan of care) patient will work with care management team to address care coordination and chronic disease management needs related to Disease Management Educational Needs Care Coordination Medication Management and Education Psychosocial Support   Interventions: Evaluation of current treatment plan related to DM II with stage 2 CKD, Hypertensive Nephropathy, self-management and patient's adherence to plan as established by provider. Collaboration with Glendale Chard, MD regarding development and update of comprehensive plan of care as evidenced by provider attestation       and co-signature Inter-disciplinary care team collaboration (see longitudinal plan of care) Provided education to patient about basic disease process related to elevated Triglycerides  Review of patient status, including review of consultant's reports, relevant laboratory and other test results, and medications completed. Reviewed medications with patient and discussed importance of medication adherence Educated patient on dietary and exercise recommendations  Mailed printed educational material  related to Cholesterol Care Guide and Ways to Lower Triglycerides  Discussed plans with patient for ongoing care management follow up and provided patient with direct contact information for care management team Self Care Activities:  Continue to keep all scheduled follow up appointments Take medications as directed  Let your healthcare team know if you  are unable to take your medications Call your pharmacy for refills at least 7 days prior to running out of medication Adhere to dietary and exercise recommendations Review printed educational material related to Cholesterol Care Guide and Ways to Lower Triglycerides  Patient Goals: - lower Triglycerides   Follow Up Plan: Telephone follow up appointment with care management team member scheduled for:  02/23/21     Care Plan : Wellness (Adult)  Updates made by Lynne Logan, RN since 11/27/2020 12:00 AM     Problem: Headaches and Impaired gait   Priority: High     Long-Range Goal: Headaches and Impaired Gait Evaluated and Treated   Start Date: 11/27/2020  Expected End Date: 05/30/2021  This Visit's Progress: On track  Note:   Current Barriers:  Ineffective Self Health Maintenance in a patient with DM II with stage 2 CKD, Hypertensive Nephropathy, Pure Hypercholesterolemia  Clinical Goal(s):  Collaboration with Glendale Chard, MD regarding development and update of comprehensive plan of care as evidenced by provider attestation and co-signature Inter-disciplinary care team collaboration (see longitudinal plan of care) patient will work with care management team to address care coordination and chronic disease management needs related to Disease Management Educational Needs Care Coordination Medication Management and Education Medication Reconciliation Psychosocial Support   Interventions:  11/27/20 completed successful outbound call with patient  Evaluation of current treatment plan related to DM II with stage 2 CKD, Hypertensive Nephropathy, Pure Hypercholesterolemia , self-management and patient's adherence to plan as established by provider. Collaboration with Glendale Chard, MD regarding development and update of comprehensive plan of care as evidenced by provider attestation       and co-signature Inter-disciplinary care team collaboration (see longitudinal plan of  care) Determined patient continues to have persistent headaches Discussed other symptoms include impaired gait, "moving in slow motion" and eye motion is reported to be "delayed", patient reports having a fall in December  Determined patient is established with Neurology and is scheduled for DAT testing on 12/09/20 to rule out PD  Mailed printed educational materials related to Parkinson's disease  Discussed plans with patient for ongoing care management follow up and provided patient with direct contact information for care management team Self Care Activities:  Self administers medications as prescribed Attends all scheduled provider appointments Calls pharmacy for medication refills Calls provider office for new concerns or questions Patient Goals: - keep scheduled follow up for DAT testing scheduled for 12/09/20  Follow Up Plan: Telephone follow up appointment with care management team member scheduled for: 02/23/21     Plan:Telephone follow up appointment with care management team member scheduled for:  02/23/21  Barb Merino, RN, BSN, CCM Care Management Coordinator [facility]  Direct Phone: [phone]

## 2020-12-09 NOTE — Patient Instructions (Signed)
Goals Addressed      Headaches and Impaired gait evaluated and treated   On track    Timeframe:  Long-Range Goal Priority:  High Start Date:  11/27/20                            Expected End Date:  05/30/21  Next scheduled follow up: 02/23/21         Self Care Activities:  Self administers medications as prescribed Attends all scheduled provider appointments Calls pharmacy for medication refills Calls provider office for new concerns or questions Patient Goals: - keep scheduled follow up for DAT testing scheduled for 12/09/20                  Hypertriglyceridemia - complications minimized or prevented       Timeframe:  Long-Range Goal Priority:  High Start Date:  08/13/20                           Expected End Date: 08/13/21  Next Scheduled Follow up date: 02/23/21          Self Care Activities:  Continue to keep all scheduled follow up appointments Take medications as directed  Let your healthcare team know if you are unable to take your medications Call your pharmacy for refills at least 7 days prior to running out of medication Adhere to dietary and exercise recommendations Review printed educational material related to Cholesterol Care Guide and Ways to Lower Triglycerides  Patient Goals: - lower Triglycerides                   Monitor and Manage My Blood Sugar-Diabetes Type 2   On track    Timeframe:  Long-Range Goal Priority:  Medium Start Date: 06/23/20                         Expected End Date:  06/23/21                    Follow Up Date: 02/23/21    - check blood sugar at prescribed times - check blood sugar before and after exercise - check blood sugar if I feel it is too high or too low - enter blood sugar readings and medication or insulin into daily log - take the blood sugar log to all doctor visits - take the blood sugar meter to all doctor visits    Why is this important?   Checking your blood sugar at home helps to keep it from getting very high or very  low.  Writing the results in a diary or log helps the doctor know how to care for you.  Your blood sugar log should have the time, date and the results.  Also, write down the amount of insulin or other medicine that you take.  Other information, like what you ate, exercise done and how you were feeling, will also be helpful.     Notes:      Obtain Eye Exam-Diabetes Type 2       Timeframe:  Long-Range Goal Priority:  High Start Date:  06/23/20                          Expected End Date:  06/23/21  Follow Up Date: 02/23/21   - keep appointment with eye doctor - schedule appointment with eye doctor    Why is this important?   Eye check-ups are important when you have diabetes.  Vision loss can be prevented.    Notes:      Perform Foot Care-Diabetes Type 2       Timeframe:  Long-Range Goal Priority:  Medium Start Date:  06/23/20                           Expected End Date:  06/23/21                     Follow Up Date: 02/23/21    - check feet daily for cuts, sores or redness - keep feet up while sitting - trim toenails straight across - wash and dry feet carefully every day - wear comfortable, cotton socks - wear comfortable, well-fitting shoes    Why is this important?   Good foot care is very important when you have diabetes.  There are many things you can do to keep your feet healthy and catch a problem early.    Notes:      Track and Manage My Blood Pressure-Hypertension   On track    Timeframe:  Long-Range Goal Priority:  Medium Start Date:  06/23/20                           Expected End Date:  06/23/21                   Follow Up Date: 02/23/21    - check blood pressure daily - choose a place to take my blood pressure (home, clinic or office, retail store) - write blood pressure results in a log or diary    Why is this important?   You won't feel high blood pressure, but it can still hurt your blood vessels.  High blood pressure can cause  heart or kidney problems. It can also cause a stroke.  Making lifestyle changes like losing a Ray Gervasi weight or eating less salt will help.  Checking your blood pressure at home and at different times of the day can help to control blood pressure.  If the doctor prescribes medicine remember to take it the way the doctor ordered.  Call the office if you cannot afford the medicine or if there are questions about it.     Notes:

## 2020-12-11 NOTE — Telephone Encounter (Signed)
Received approval for DaTscan. Ready for scheduling, message sent to [facility] and order faxed.   [place] OK:[id number] (12/05/20- 01/19/21).

## 2020-12-18 ENCOUNTER — Other Ambulatory Visit: Payer: Self-pay | Admitting: Internal Medicine

## 2020-12-18 DIAGNOSIS — Z1231 Encounter for screening mammogram for malignant neoplasm of breast: Secondary | ICD-10-CM

## 2020-12-19 ENCOUNTER — Encounter (HOSPITAL_COMMUNITY)
Admission: RE | Admit: 2020-12-19 | Discharge: 2020-12-19 | Disposition: A | Discharge status: Home / Self Care | Payer: Medicare Other | Source: Ambulatory Visit | Attending: Neurology | Admitting: Neurology

## 2020-12-19 ENCOUNTER — Other Ambulatory Visit: Payer: Self-pay

## 2020-12-19 DIAGNOSIS — Z9181 History of falling: Secondary | ICD-10-CM | POA: Insufficient documentation

## 2020-12-19 DIAGNOSIS — M2011 Hallux valgus (acquired), right foot: Secondary | ICD-10-CM | POA: Diagnosis not present

## 2020-12-19 DIAGNOSIS — E1142 Type 2 diabetes mellitus with diabetic polyneuropathy: Secondary | ICD-10-CM | POA: Diagnosis not present

## 2020-12-19 DIAGNOSIS — R471 Dysarthria and anarthria: Secondary | ICD-10-CM | POA: Diagnosis not present

## 2020-12-19 DIAGNOSIS — G20 Parkinson's disease: Secondary | ICD-10-CM | POA: Diagnosis not present

## 2020-12-19 DIAGNOSIS — H519 Unspecified disorder of binocular movement: Secondary | ICD-10-CM | POA: Insufficient documentation

## 2020-12-19 DIAGNOSIS — R413 Other amnesia: Secondary | ICD-10-CM | POA: Diagnosis not present

## 2020-12-19 DIAGNOSIS — R269 Unspecified abnormalities of gait and mobility: Secondary | ICD-10-CM | POA: Diagnosis not present

## 2020-12-19 DIAGNOSIS — R42 Dizziness and giddiness: Secondary | ICD-10-CM | POA: Diagnosis not present

## 2020-12-19 DIAGNOSIS — I70203 Unspecified atherosclerosis of native arteries of extremities, bilateral legs: Secondary | ICD-10-CM | POA: Diagnosis not present

## 2020-12-19 MED ORDER — POTASSIUM IODIDE (ANTIDOTE) 130 MG PO TABS
130.0000 mg | ORAL_TABLET | Freq: Once | ORAL | Status: AC
  Administered 2020-12-19: 130 mg via ORAL

## 2020-12-19 MED ORDER — POTASSIUM IODIDE (ANTIDOTE) 130 MG PO TABS
ORAL_TABLET | ORAL | Status: AC
  Filled 2020-12-19: qty 1

## 2020-12-22 ENCOUNTER — Other Ambulatory Visit: Payer: Self-pay | Admitting: Internal Medicine

## 2020-12-22 DIAGNOSIS — R490 Dysphonia: Secondary | ICD-10-CM

## 2020-12-23 ENCOUNTER — Telehealth: Payer: Self-pay | Admitting: *Deleted

## 2020-12-23 NOTE — Telephone Encounter (Addendum)
Called patient and LVM asking for call back.  Patient returned my call and we discussed the results of the [place].  Patient aware this test showed an abnormal/lower uptake of the radioactive marker in her brain and that this is not a definitive test for Parkinson's disease and does not distinguish between that and other atypical parkinsonism disorders.  Discussed appointment options for follow-up. Pt was scheduled for an appt tomorrow morning at 9:30 AM arrival 9:00 to discuss results and next steps.  She will bring her questions. She verbalized appreciation for the call.

## 2020-12-23 NOTE — Telephone Encounter (Signed)
-----   Message from [name], MD sent at 12/22/2020 12:15 PM EDT ----- Pls call patient regarding the recent nuclear medicine DaT scan result. As discussed, this is a specialized brain scan designed to help with diagnosis of tremor disorders, including parkinsonian disorders. A radioactive marker gets injected and the uptake is measured in the brain and compared to normal controls and right side is compared to the left. A change in uptake can help with diagnosis of certain tremor disorders and narrow down the diagnostic possibilities. 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 Again, this is not a definitive test for Parkinson's disease and does not distinguish between Parkinson's disease and other, atypical parkinsonism disorders. Please assist in making a follow-up appointment as she has no appointment pending.

## 2020-12-24 ENCOUNTER — Ambulatory Visit: Payer: Medicare Other | Admitting: Neurology

## 2020-12-24 ENCOUNTER — Encounter: Payer: Self-pay | Admitting: Neurology

## 2020-12-24 VITALS — BP 172/79 | HR 67 | Ht 61.6 in | Wt 171.6 lb

## 2020-12-24 DIAGNOSIS — H519 Unspecified disorder of binocular movement: Secondary | ICD-10-CM

## 2020-12-24 DIAGNOSIS — G20 Parkinson's disease: Secondary | ICD-10-CM

## 2020-12-24 DIAGNOSIS — R413 Other amnesia: Secondary | ICD-10-CM | POA: Diagnosis not present

## 2020-12-24 DIAGNOSIS — R471 Dysarthria and anarthria: Secondary | ICD-10-CM | POA: Diagnosis not present

## 2020-12-24 MED ORDER — CARBIDOPA-LEVODOPA 25-100 MG PO TABS: 1.0000 | ORAL_TABLET | Freq: Three times a day (TID) | ORAL | 5 refills | Status: DC

## 2020-12-24 NOTE — Patient Instructions (Addendum)
It was nice to see you both again today.  As discussed, we will start you on a medication for Parkinson's disease even though you may have an atypical form of parkinsonism.  Please start Sinemet (generic name: carbidopa-levodopa) 25/100 mg: Take half a pill twice daily (8 AM and noon) for one week, then half a pill 3 times a day (8 AM, noon, and 4 PM) for one week, then one pill 3 times a day thereafter. Please try to take the medication away from you mealtimes, that is, ideally either one hour before or 2 hours after your meal to ensure optimal absorption. The medication can interfere with the protein content of your meal and trying to the protein in your food and therefore not get fully absorbed.  Common side effects reported are: Nausea, vomiting, sedation, confusion, lightheadedness. Rare side effects include hallucinations, severe nausea or vomiting, diarrhea and significant drop in blood pressure especially when going from lying to standing or from sitting to standing.   Please monitor for constipation issues, try to hydrate well with water, limit your caffeine intake by limiting your coffee and soda intake to 1 or 2 servings per day if possible.  I will make a referral to neuro rehab for you to be evaluated with physical therapy, speech therapy, and Occupational Therapy.  They may make suggestions for therapy.  I have filled out your disability placard form.  Please follow-up routinely in 4 months, sooner if needed, you can always stay in touch via MyChart messaging or call us anytime.

## 2020-12-24 NOTE — Progress Notes (Signed)
Subjective:    Patient ID: April Larson is a 70 y.o. female.  HPI    Interim history:   April Larson is a 70 year old right-handed woman with an underlying medical history of hypertension, diabetes, reflux disease, allergies, sleep apnea and borderline obesity, who presents for follow-up consultation of her parkinsonism.  She is accompanied by her sister, [name]. I saw her on 07/23/2020 at the request of her primary care physician, at which time she reported a several month history of difficulty with her balance, recent fall, problems with fine motor control and speech.  She had a DaTscan on 12/19/2020 and I reviewed the results: IMPRESSION: Decreased radiotracer activity within the striata with greater deficit on the LEFT. Findings are suggestive of Parkinsonian syndrome pathology.   Of note, DaTSCAN is not diagnostic of Parkinsonian syndromes, which remains a clinical diagnosis. DaTscan is an adjuvant test to aid in the clinical diagnosis of Parkinsonian syndromes.  We called her with her test results.   Today, 12/24/2020: She reports feeling stable, no recent falls.  She may not be as good with her cognitive sharpness, has had some forgetfulness.  She continues to drive, she limits herself to daytime driving in familiar routes.  Sister has driven behind her and also with her a few weeks ago.  She has noticed that patient tends to take slightly wider turns.  She has also noted that patient has had some forgetfulness and has a tendency to repeat herself.  She has intermittent constipation, tries to hydrate well and stay active.  Patient has noticed difficulty with her handwriting.  Previously (copied from previous notes for reference):    (She) reports a 3+ month history of difficulty with her balance, recent fall with head injury, feeling wobbly, feeling like she could fall again, problems with fine motor control, slowness, change in her speech.  Her sister reports that  she has noticed changes in patient's behavior and that she seems to be less attentive at times, seems to zone out.  She has not had any passing out spells or convulsions or twitching or involuntary movements, no tremors.  However, she did take a significant fall.  Patient reports that she fell when she was working at [employer].  This was on 03/27/2020.  She was in the process of knocking at the door where the ushers are and the next thing she realized was that she was on the floor.  She did not have any warning sign, no lightheadedness, no spinning sensation, no headache.  She was assisted by a nurse that was onsite.  She was taken to the emergency room.  I reviewed the emergency room record from 03/27/2020.  She went to [facility] long [facility] ED.   She did sustain a rib fracture of the left 10th rib.  She reports that after she had her rib fracture, she stopped using her CPAP but otherwise she has been compliant with it.  After about a month she resumed using her CPAP.  She has not had any difficulty swallowing but has noticed changes in her speech.  Her sister reports that she seems to speak very deliberately and slowly and also eats slowly.  Patient reports that her handwriting has changed.  It has become much more tremulous and sloppy.  She is to have a good penmanship.   I reviewed your office note from 04/15/2020.  You ordered a brain MRI.   She had a brain MRI with and without contrast on 06/07/2020 and  I reviewed the results: IMPRESSION: No evidence of recent infarction, hemorrhage, or mass. No abnormal enhancement.   Mild chronic microvascular ischemic changes.    She had recent labs through your office on 04/15/2020.  Vitamin B12 was 1323, TSH was 1.83, urinalysis negative, CMP showed a glucose of 90, BUN 22, creatinine 0.91, alk phos was elevated at 196, AST and ALT normal.  A1c was mildly elevated at 7.1.  CBC showed WBC at 1.1, hemoglobin 12.4, hematocrit 37.7, platelets mildly elevated at  480.   She had a head CT without contrast on 03/27/2020 and I reviewed the results: IMPRESSION: 1. Left frontal scalp soft tissue swelling without underlying fracture. 2. No acute intracranial abnormality. 3. Mild generalized atrophy and white matter disease likely reflects the sequela of chronic microvascular ischemia.   I previously evaluated her over 2 years ago for sleep apnea.  She had a baseline sleep study on 04/10/2018 which showed an AHI of 12.3/h, REM AHI of 24.3/h, O2 nadir 88%.  She had a CPAP titration study on 06/05/2018 at which time she was recommended to start CPAP at 9 cm of water pressure.   She did not return for follow-up.  She has been on CPAP.  Current 30-day compliance showed excellent compliance with an average usage of 5 hours and 48 minutes, residual AHI at goal at 3.3/h, leak on the low side, pressure at 9 cm.  Percent use days greater than 4 hours at 90%.    03/09/18: April Larson is a 70 year old right-handed woman with an underlying medical history of type 2 diabetes, chronic kidney disease, hypertension, seasonal allergies, and obesity, who was previously diagnosed with obstructive sleep apnea. She was placed on CPAP therapy. She has not had re-evaluation in years. Prior sleep study results are not available for my review today. I reviewed your office note from 01/12/2018. She was complaining of memory loss. she had blood work through your office on 01/12/2018, including RPR, B12, lipid profile, A1c, CMP, TSH. I reviewed the test results. Blood tests were benign, A1c 6.5. Her Epworth sleepiness score is 16 out of 24, fatigue score is 42 out of 63. She reports that she could not tolerate CPAP in the past. She reports that she had a sinus infection after she started using CPAP. She estimates that this was about 15 years ago. She is widowed; sadly, her husband passed away in 04-25-17. She lives alone, no children, has 1 dog in the household, he sleeps on the bed with  her. She is retired. She quit smoking in 2004, does not utilize alcohol and does not drink caffeine on a regular basis. Her bedtime is around 11:30 and she is typically asleep at midnight. She does have a TV on in her bedroom which tends to stay on all night. Rise time is around 6:50 AM. She has nocturia about once per average night and denies recurrent morning headaches. She is not aware of any family history of OSA. She typically takes a nap for an hour around 4 PM daily. She tried a fullface mask in the past for her CPAP machine and it was uncomfortable. She would be willing to get retested and consider CPAP therapy again. She has had some short-term memory issues.   Her Past Medical History Is Significant For: Past Medical History:  Diagnosis Date   Diabetes mellitus without complication ([place])    Hypertension    Substance abuse ([place])     Her Past Surgical History Is Significant For:  Past Surgical History:  Procedure Laterality Date   EYE SURGERY      Her Family History Is Significant For: Family History  Problem Relation Age of Onset   Cancer Mother    Diabetes Father    Cancer Brother    Breast cancer Maternal Aunt     Her Social History Is Significant For: Social History   Socioeconomic History   Marital status: Widowed    Spouse name: Not on file   Number of children: Not on file   Years of education: Not on file   Highest education level: Not on file  Occupational History   Occupation: retired  Tobacco Use   Smoking status: Former    Packs/day: 1.00    Years: 34.00    Pack years: 34.00    Types: Cigarettes    Start date: 1970    Quit date: 04/05/2002    Years since quitting: 18.7   Smokeless tobacco: Never  Vaping Use   Vaping Use: Never used  Substance and Sexual Activity   Alcohol use: No    Alcohol/week: 0.0 standard drinks   Drug use: No   Sexual activity: Not Currently  Other Topics Concern   Not on file  Social History Narrative   Not on file    Social Determinants of Health   Financial Resource Strain: Low Risk    Difficulty of Paying Living Expenses: Not hard at all  Food Insecurity: No Food Insecurity   Worried About Programme researcher, broadcasting/film/video in the Last Year: Never true   Ran Out of Food in the Last Year: Never true  Transportation Needs: No Transportation Needs   Lack of Transportation (Medical): No   Lack of Transportation (Non-Medical): No  Physical Activity: Inactive   Days of Exercise per Week: 0 days   Minutes of Exercise per Session: 0 min  Stress: No Stress Concern Present   Feeling of Stress : Not at all  Social Connections: Not on file    Her Allergies Are:  No Known Allergies:   Her Current Medications Are:  Outpatient Encounter Medications as of 12/24/2020  Medication Sig   ACCU-CHEK FASTCLIX LANCETS MISC USE TO CHECK BLOOD SUGARS TWICE DAILY   ACCU-CHEK GUIDE test strip USE TO CHECK BLOOD SUGARS TWICE DAILY AS DIRECTED   Ascorbic Acid (VITAMIN C PO) Take 1 tablet by mouth daily.   aspirin EC 81 MG tablet Take 81 mg by mouth daily.   B-D ULTRAFINE III SHORT PEN 31G X 8 MM MISC USE AS DIRECTED TWICE DAILY   Biotin 10 MG CAPS Take 1 tablet by mouth daily.   Blood Glucose Monitoring Suppl (ACCU-CHEK GUIDE ME) w/Device KIT USE TO CHECK BLOOD GLUCOSE TWICE DAILY   calcium carbonate (OSCAL) 1500 (600 Ca) MG TABS tablet Take 600 mg of elemental calcium by mouth daily with breakfast.   Cholecalciferol 125 MCG (5000 UT) TABS Take 5,000 Units by mouth daily.    diclofenac Sodium (VOLTAREN) 1 % GEL APPLY 2 GRAMS TOPICALLY TO THE AFFECTED AREA FOUR TIMES DAILY   fluticasone (FLONASE) 50 MCG/ACT nasal spray SHAKE LIQUID AND USE 2 SPRAYS IN EACH NOSTRIL DAILY   LIVALO 4 MG TABS TAKE 1 TABLET BY MOUTH DAILY   loratadine (CLARITIN) 10 MG tablet Take 10 mg by mouth daily. AM   metFORMIN (GLUCOPHAGE) 500 MG tablet Take 1 tablet (500 mg total) by mouth daily with breakfast. Dinner (Patient taking differently: Take 500 mg by  mouth daily with breakfast.)  Multiple Vitamins-Minerals (ONE-A-DAY WOMENS 50+ ADVANTAGE PO) Take 1 tablet by mouth daily.   NON FORMULARY 2 each. Calm   Omega-3 Fatty Acids (FISH OIL PO) Take by mouth.   omeprazole (PRILOSEC) 40 MG capsule TAKE 1 CAPSULE BY MOUTH EVERY DAY BEFORE A MEAL   pioglitazone (ACTOS) 15 MG tablet TAKE 1 TABLET BY MOUTH DAILY   telmisartan-hydrochlorothiazide (MICARDIS HCT) 40-12.5 MG tablet TAKE 1 TABLET BY MOUTH DAILY   Turmeric 500 MG CAPS Take 1 capsule by mouth 2 (two) times daily.   XULTOPHY 100-3.6 UNIT-MG/ML SOPN INJECT 25 UNITS SUBCUTANEOUSLY DAILY   Facility-Administered Encounter Medications as of 12/24/2020  Medication   potassium iodide (IOSAT) 130 MG tablet  :  Review of Systems:  Out of a complete 14 point review of systems, all are reviewed and negative with the exception of these symptoms as listed below:   Review of Systems  Neurological:        Go over DAT scan results.    Objective:  Neurological Exam  Physical Exam Physical Examination:   Vitals:   12/24/20 0921  BP: (!) 172/79  Pulse: 67    General Examination: The patient is a very pleasant 70 y.o. female in no acute distress. She appears well-developed and well-nourished and well groomed.   HEENT: Normocephalic, atraumatic, pupils are equal, round and reactive to light, extraocular tracking is impaired, slow eye movements side to side but significant limitation to upgaze and downgaze, no nystagmus.  Decrease in eye blink rate noted and facial masking noted.  She has moderate nuchal rigidity and decreased active range of motion in her neck.  She has slow speech, mild hypophonia at times.  She has minimal or slight dysarthria at times.  She has no obvious sialorrhea.  Tongue protrudes centrally and palate elevates symmetrically, no carotid bruits.     Chest: Clear to auscultation without wheezing, rhonchi or crackles noted.   Heart: S1+S2+0, regular and normal without murmurs,  rubs or gallops noted.    Abdomen: Soft, non-tender and non-distended.   Extremities: There is no pitting edema in the distal lower extremities bilaterally.    Skin: Warm and dry without trophic changes noted.   Musculoskeletal: exam reveals no obvious joint deformities, or tenderness.   Neurologically:  Mental status: The patient is awake, alert and oriented in all 4 spheres. Her immediate and remote memory, attention, language skills and fund of knowledge are fairly appropriate. Thought process is linear.  She has bradyphrenia, and sister provide some details.  MMSE - Mini Mental State Exam 12/24/2020  Orientation to time 5  Orientation to Place 5  Registration 3  Attention/ Calculation 3  Recall 3  Language- name 2 objects 2  Language- repeat 1  Language- follow 3 step command 3  Language- read & follow direction 1  Write a sentence 1  Copy design 0  Total score 27    On 12/24/2020: CDT: 3/4, AFT: 9/min.  Cranial nerves II - XII are as described above under HEENT exam.  Motor exam: Normal bulk, global strength of 4+ out of 5, increase in tone in the right more than left upper extremity, no obvious cogwheeling noted.  No resting tremor noted.  Romberg is not tested due to safety concerns, reflexes are 1-2+ throughout.  Fine motor skill testing reveals decreased ability with right-sided predominance of finger taps, and foot taps, in the mild-to-moderate range, mild to moderate impairment of hand movements bilaterally, mild impairment of rapid alternating padding in the bilateral  hands.   Cerebellar testing: No dysmetria or intention tremor on finger to nose testing. Heel to shin is unremarkable bilaterally.  Sensory exam: intact to light touch.  Gait, station and balance: She stands with mild difficulty and pushes herself up.  Posture is age-appropriate to mildly stooped, stance is mildly wider base.  She has no walking aid.  She walks slowly and cautiously, mildly decreased arm  swing noted right more than left, no obvious shuffling noted.    Assessment and Plan:  In summary, April Larson is a very pleasant 70 year old female with an underlying medical history of hypertension, diabetes, reflux disease, allergies, sleep apnea and borderline obesity, who presents for follow-up consultation of her dizziness, several month history of fine motor dyscontrol, feeling off balance, changes in handwriting, changes in speech and balance.  History and examination are in keeping with parkinsonism.  She had a recent DaTscan on 12/19/2020 which showed decreased radiotracer activity within the striata with greater deficit on the left.  She does have a little bit of lateralization of her findings to the right.  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  She had a brain MRI which showed relatively benign findings.  Today, we talked about her presentation, her symptoms, her medication options.  I would like for her to start Sinemet generic 25-100 mg strength with gradual titration.  We talked about expectations, potential side effects and limitation of the medication.  We talked about the importance of healthy lifestyle and being proactive about constipation issues and fall risk.  She is advised to stay well-hydrated and well rested, she is furthermore advised to monitor her driving skills.  We will also monitor her memory.  Memory scores of mildly abnormal today.  We may consider memory medication in the near future.  She requested a disability placard for her car, I filled out the [facility] form today.   She is agreeable to starting Sinemet.  We talked about the importance of physical activity, she is agreeable to a referral to neuro rehab, I would like for her to be evaluated by the multidisciplinary team with PT, OT and ST.  She was agreeable.  She is advised to follow-up in this clinic in 3-4 months,  sooner if needed.  I answered all their questions today and the patient and her sister were in agreement. I spent 40 minutes in total face-to-face time and in reviewing records during pre-charting, more than 50% of which was spent in counseling and coordination of care, reviewing test results, reviewing medications and treatment regimen and/or in discussing or reviewing the diagnosis of parkinsonism, the prognosis and treatment options. Pertinent laboratory and imaging test results that were available during this visit with the patient were reviewed by me and considered in my medical decision making (see chart for details).

## 2020-12-25 DIAGNOSIS — I70203 Unspecified atherosclerosis of native arteries of extremities, bilateral legs: Secondary | ICD-10-CM | POA: Diagnosis not present

## 2020-12-25 DIAGNOSIS — I739 Peripheral vascular disease, unspecified: Secondary | ICD-10-CM | POA: Diagnosis not present

## 2020-12-29 ENCOUNTER — Telehealth: Payer: Self-pay | Admitting: Neurology

## 2020-12-29 NOTE — Telephone Encounter (Signed)
Spoke with patient and answered her questions. She verbalized understanding and appreciation.

## 2020-12-29 NOTE — Telephone Encounter (Signed)
Pt want to know if Covid vaccine  will have an effect on carbidopa-levodopa (SINEMET IR) 25-100 MG tablet, Would like a call from the nurse.

## 2020-12-29 NOTE — Telephone Encounter (Signed)
Sinemet should not affect the effectiveness of the COVID vaccination.

## 2021-01-06 ENCOUNTER — Other Ambulatory Visit: Payer: Self-pay

## 2021-01-06 ENCOUNTER — Ambulatory Visit: Payer: Medicare Other | Attending: Internal Medicine | Admitting: Occupational Therapy

## 2021-01-06 DIAGNOSIS — R2689 Other abnormalities of gait and mobility: Secondary | ICD-10-CM | POA: Insufficient documentation

## 2021-01-06 DIAGNOSIS — R293 Abnormal posture: Secondary | ICD-10-CM | POA: Diagnosis not present

## 2021-01-06 DIAGNOSIS — M25612 Stiffness of left shoulder, not elsewhere classified: Secondary | ICD-10-CM | POA: Diagnosis not present

## 2021-01-06 DIAGNOSIS — R471 Dysarthria and anarthria: Secondary | ICD-10-CM | POA: Diagnosis not present

## 2021-01-06 DIAGNOSIS — R41842 Visuospatial deficit: Secondary | ICD-10-CM | POA: Diagnosis present

## 2021-01-06 DIAGNOSIS — M25611 Stiffness of right shoulder, not elsewhere classified: Secondary | ICD-10-CM | POA: Diagnosis not present

## 2021-01-06 DIAGNOSIS — R29818 Other symptoms and signs involving the nervous system: Secondary | ICD-10-CM | POA: Insufficient documentation

## 2021-01-06 DIAGNOSIS — M25621 Stiffness of right elbow, not elsewhere classified: Secondary | ICD-10-CM | POA: Diagnosis not present

## 2021-01-06 DIAGNOSIS — R41841 Cognitive communication deficit: Secondary | ICD-10-CM | POA: Insufficient documentation

## 2021-01-06 DIAGNOSIS — R278 Other lack of coordination: Secondary | ICD-10-CM | POA: Insufficient documentation

## 2021-01-06 DIAGNOSIS — M25622 Stiffness of left elbow, not elsewhere classified: Secondary | ICD-10-CM | POA: Insufficient documentation

## 2021-01-06 DIAGNOSIS — R41844 Frontal lobe and executive function deficit: Secondary | ICD-10-CM | POA: Insufficient documentation

## 2021-01-06 DIAGNOSIS — R2681 Unsteadiness on feet: Secondary | ICD-10-CM | POA: Insufficient documentation

## 2021-01-06 NOTE — Therapy (Signed)
[facility] [address] [place], [place], [address] Phone: [phone]   Fax:  [phone]  Occupational Therapy Evaluation  Patient Details  Name: April Larson MRN: [id number] Date of Birth: Jan 04, 1951 Referring Provider (OT): Dr. Rexene Alberts   Encounter Date: 01/06/2021   OT End of Session - 01/06/21 1507     Visit Number 1    Number of Visits 25    Date for OT Re-Evaluation 03/31/21    Authorization - Visit Number 1    Authorization - Number of Visits 10    Progress Note Due on Visit 10    OT Start Time 4287    OT Stop Time 1400    OT Time Calculation (min) 37 min             Past Medical History:  Diagnosis Date   Diabetes mellitus without complication ([place])    Hypertension    Substance abuse ([place])     Past Surgical History:  Procedure Laterality Date   EYE SURGERY      There were no vitals filed for this visit.   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 PMH significant for hypertension, diabetes, reflux disease, allergies, sleep apnea and borderline obesity, hx of rib fx s/p fall,    Patient Stated Goals maintain independence    Currently in Pain? No/denies               [facility] OT Assessment - 01/06/21 1328       Assessment   Medical Diagnosis Parkinsonism possible PSP    Referring Provider (OT) Dr. Rexene Alberts    Onset Date/Surgical Date 12/24/20    Hand Dominance Right      Precautions   Precautions Fall      Balance Screen   Has the patient fallen in the past 6 months Yes    How many times? 1    Has the patient had a decrease in activity level because of a fear of falling?  Yes    Is the patient reluctant to leave their home because of a fear of falling?  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      ADL   Eating/Feeding Modified independent   difficulty cutting food   Grooming Modified independent    Upper Body Bathing Modified independent   stand up in tub/ shower, increased time required   [place] independent    Upper Body Dressing Increased time   modified independent   Lower Body Dressing Needs assist for fasteners;Modified independent    Toilet Transfer Modified independent    Tub/Shower Transfer Modified independent    ADL comments Pt reports difficulty with the following activities: fastening buttons, typing, writing,      IADL   Shopping Takes care of all shopping needs independently    Light  Housekeeping Maintains house alone or with occasional assistance    Meal Prep Able to complete simple warm meal prep    Medication Management Is responsible for taking medication in correct dosages at correct time    Financial Management --   may benefit from assistance , more forgetful, pays bills over the phone     Mobility   Mobility Status Independent;History of falls      Written Expression   Dominant Hand Right    Handwriting 75% legible   moderate micrographia     Vision - History   Baseline Vision Wears glasses all the time      Vision Assessment   Vision Assessment Vision impaired  _ to be further tested in functional context    Ocular Range of Motion --   retricted all directions   Comment Pt is unable to track horizontally or vertically      Cognition   Overall Cognitive Status Impaired/Different from baseline    Area of Impairment Memory;Safety/judgement;Awareness   slowed processing   Safety/Judgement Decreased awareness of  safety;Decreased awareness of deficits    Memory Impaired    Memory Impairment Decreased short term memory    Awareness Impaired    Awareness Impairment Intellectual impairment    Behaviors --   slowed processing     Observation/Other Assessments   Other Surveys  Select    Physical Performance Test   Yes    Simulated Eating Time (seconds) 20.84    Donning Doffing Jacket Comments 3 button/ unbutton 1 min 23 secs      Sensation   Light Touch Appears Intact      Coordination   Gross Motor Movements are Fluid and Coordinated No    Fine Motor Movements are Fluid and Coordinated No    9 Hole Peg Test Right;Left    Right 9 Hole Peg Test 37.78    Left 9 Hole Peg Test 35    Box and Blocks RUE 41 blocks, LUE 39 blocks      Tone   Assessment Location Right Upper Extremity;Left Upper Extremity      ROM / Strength   AROM / PROM / Strength AROM      AROM   Overall AROM  Deficits    Overall AROM Comments RUE shoulder flexion 110, elbow extension -20, LUE shoulder flexion 115, elbow extension -20      RUE Tone   RUE Tone Mild   rigidity     LUE Tone   LUE Tone --   no significant rigidity                               OT Short Term Goals - 01/06/21 1519       OT SHORT TERM GOAL #1   Title I with PD specific HEP    Time 4    Period Weeks    Status New    Target Date 02/03/21      OT SHORT TERM GOAL #2   Title Pt will verbalize understanding of adapted strategies to maximize safety and I with ADLs/ IADLs (ie: cutting food, fasten buttons, writing)    Time 4    Period Weeks    Status New      OT SHORT TERM GOAL #3   Title Pt will verbalize understanding of compensatory strategies  for visual deficits.    Time 4    Period Weeks  Status New      OT SHORT TERM GOAL #4   Title Pt will demonstrate improved fine motor coordination for ADLs as evidenced by decreasing 9 hole peg test score for RUE by 3 secs    Time 4    Period Weeks    Status New       OT SHORT TERM GOAL #5   Title assess PPT#4 don/ doff jacket and set goal prn    Time 4    Period Weeks    Status New      Additional Short Term Goals   Additional Short Term Goals Yes      OT SHORT TERM GOAL #6   Title Pt will demonstrate understanding of memory compensations and ways to keep thinking skills sharp    Time 4    Period Weeks    Status New               OT Long Term Goals - 01/06/21 1522       OT LONG TERM GOAL #1   Title Pt will write a short paragraph with 100% legibility and  minimal decrease in letter size    Time 12    Period Weeks    Status New    Target Date 03/31/21      OT LONG TERM GOAL #2   Title Pt will demonstrate ability to retrieve a lightweight object at 115 shoulder flexion and -15 elbow extension with RUE    Time 12    Period Weeks    Status New      OT LONG TERM GOAL #3   Title Pt will demonstrate ability to retrieve a lightweight object at 120 shoulder flexion and -15 elbow extension with LUE    Time 12    Period Weeks    Status New      OT LONG TERM GOAL #4   Title Pt will demonstrate improved ease with feeding as evidenced by decreasing PPT#2 by 3 secs    Time 12    Period Weeks    Status New      OT LONG TERM GOAL #5   Title Pt will demonstrate improved ease with fastening buttons as evidenced by decreasing 3 button/ unbutton time to 70 secs or less    Baseline 83 secs    Time 12    Period Weeks    Status New      Long Term Additional Goals   Additional Long Term Goals Yes      OT LONG TERM GOAL #6   Title Pt will demonstrate improved bilateral UE functional use for ADLs as evidenced by increasing box/ blocks score by 4 blocks with right and left UE's    Baseline R 41, L 39    Time 12    Period Weeks    Status New                   Plan - 01/06/21 1515     Clinical Impression Statement Pt is a 70 y.o female referred for  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 PMH significant for hypertension,  diabetes, reflux disease, allergies, sleep apnea and borderline obesity, hx of rib fx s/p fall,(CT shows generalized atrophy and white matter disease , DaTSCAN results are suggestive of Parkinsonian syndromes.) Pt presents to occupational therapy with the following deficits: bradykinesia, visual deficits, cognitive deficits, decreased coordination, rigidity, decreased balance, decreased functional mobility, decreased ROM which impedes daily activities. Pt can benefit from skilled  occupational therapy to address these deficits in order to maximize pt's safety and I with ADLs/ IADLS.    OT Occupational Profile and History Detailed Assessment- Review of Records and additional review of physical, cognitive, psychosocial history related to current functional performance    Occupational performance deficits (Please refer to evaluation for details): ADL's;IADL's;Leisure;Social Participation;Play;Work    Marketing executive / Function / Physical Skills ADL;Endurance;UE functional use;Balance;Vision;Flexibility;FMC;ROM;Gait;Coordination;GMC;Sensation;Decreased knowledge of precautions;Decreased knowledge of use of DME;IADL;Strength;Dexterity;Mobility;Tone    Cognitive Skills Memory;Problem Solve;Safety Awareness;Thought    Rehab Potential Good    Clinical Decision Making Limited treatment options, no task modification necessary    Comorbidities Affecting Occupational Performance: May have comorbidities impacting occupational performance    Modification or Assistance to Complete Evaluation  No modification of tasks or assist necessary to complete eval    OT Frequency 2x / week   plus eval, anticipate d/c following 8 weeks dependent upon pt progress.   OT Duration 12 weeks    OT Treatment/Interventions Self-care/ADL training;Energy conservation;Visual/perceptual remediation/compensation;Patient/family education;DME and/or AE instruction;Gait Training;Passive range of motion;Balance  training;Fluidtherapy;Cryotherapy;Electrical Stimulation;Functional Mobility Training;Splinting;Therapeutic activities;Manual Therapy;Therapeutic exercise;Moist Heat;Neuromuscular education;Cognitive remediation/compensation    Plan assess PPT#4 and set goal prn, further assess vision and educate in compensatory strategies    Consulted and Agree with Plan of Care Patient             Patient will benefit from skilled therapeutic intervention in order to improve the following deficits and impairments:   Body Structure / Function / Physical Skills: ADL, Endurance, UE functional use, Balance, Vision, Flexibility, FMC, ROM, Gait, Coordination, GMC, Sensation, Decreased knowledge of precautions, Decreased knowledge of use of DME, IADL, Strength, Dexterity, Mobility, Tone Cognitive Skills: Memory, Problem Solve, Safety Awareness, Thought     Visit Diagnosis: Other lack of coordination - Plan: Ot plan of care cert/re-cert  Other symptoms and signs involving the nervous system - Plan: Ot plan of care cert/re-cert  Stiffness of right shoulder, not elsewhere classified - Plan: Ot plan of care cert/re-cert  Stiffness of left shoulder, not elsewhere classified - Plan: Ot plan of care cert/re-cert  Stiffness of right elbow, not elsewhere classified - Plan: Ot plan of care cert/re-cert  Stiffness of left elbow, not elsewhere classified - Plan: Ot plan of care cert/re-cert  Frontal lobe and executive function deficit - Plan: Ot plan of care cert/re-cert  Visuospatial deficit - Plan: Ot plan of care cert/re-cert    Problem List Patient Active Problem List   Diagnosis Date Noted   Decreased dorsalis pedis pulse 11/17/2020   Tachycardia 08/11/2020   Cough 08/11/2020   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   Pure hypercholesterolemia 01/23/2020   Vitamin D deficiency 10/23/2019   Right hip pain 05/15/2018   Estrogen deficiency 05/15/2018   Class 1 obesity due to excess calories with serious  comorbidity and body mass index (BMI) of 31.0 to 31.9 in adult 05/15/2018   Diabetes mellitus with stage 2 chronic kidney disease ([place]) 01/11/2018   Benign hypertension with chronic kidney disease, stage II 01/11/2018    Saba Neuman, OT/L 01/06/2021, 3:35 PM  [facility] [address] [place], [place], [address] Phone: [phone]   Fax:  [phone]  Name: April Larson MRN: [id number] Date of Birth: June 14, 1950

## 2021-01-09 ENCOUNTER — Encounter: Payer: Self-pay | Admitting: Physical Therapy

## 2021-01-09 ENCOUNTER — Ambulatory Visit: Payer: Medicare Other

## 2021-01-09 ENCOUNTER — Other Ambulatory Visit: Payer: Self-pay

## 2021-01-09 ENCOUNTER — Ambulatory Visit: Payer: Medicare Other | Admitting: Physical Therapy

## 2021-01-09 DIAGNOSIS — R471 Dysarthria and anarthria: Secondary | ICD-10-CM

## 2021-01-09 DIAGNOSIS — R293 Abnormal posture: Secondary | ICD-10-CM

## 2021-01-09 DIAGNOSIS — R29818 Other symptoms and signs involving the nervous system: Secondary | ICD-10-CM

## 2021-01-09 DIAGNOSIS — R2681 Unsteadiness on feet: Secondary | ICD-10-CM | POA: Diagnosis not present

## 2021-01-09 DIAGNOSIS — M25621 Stiffness of right elbow, not elsewhere classified: Secondary | ICD-10-CM | POA: Diagnosis not present

## 2021-01-09 DIAGNOSIS — R2689 Other abnormalities of gait and mobility: Secondary | ICD-10-CM

## 2021-01-09 DIAGNOSIS — M25611 Stiffness of right shoulder, not elsewhere classified: Secondary | ICD-10-CM | POA: Diagnosis not present

## 2021-01-09 DIAGNOSIS — R41841 Cognitive communication deficit: Secondary | ICD-10-CM

## 2021-01-09 DIAGNOSIS — R278 Other lack of coordination: Secondary | ICD-10-CM | POA: Diagnosis not present

## 2021-01-09 DIAGNOSIS — M25622 Stiffness of left elbow, not elsewhere classified: Secondary | ICD-10-CM | POA: Diagnosis not present

## 2021-01-09 DIAGNOSIS — M25612 Stiffness of left shoulder, not elsewhere classified: Secondary | ICD-10-CM | POA: Diagnosis not present

## 2021-01-09 NOTE — Therapy (Signed)
[facility] [address] [place], [place], [address] Phone: [phone]   Fax:  [phone]  Physical Therapy Evaluation  Patient Details  Name: April Larson MRN: [id number] Date of Birth: October 18, 1950 Referring Provider (PT): [name], MD   Encounter Date: 01/09/2021   PT End of Session - 01/09/21 1109     Visit Number 1    Number of Visits 17    Date for PT Re-Evaluation 04/09/21    Authorization Type [employer] Medicare - needs 10th visit PN    PT Start Time 0719    PT Stop Time 0800    PT Time Calculation (min) 41 min    Equipment Utilized During Treatment Gait belt    Activity Tolerance Patient tolerated treatment well    Behavior During Therapy WFL for tasks assessed/performed             Past Medical History:  Diagnosis Date   Diabetes mellitus without complication ([place])    Hypertension    Substance abuse ([place])     Past Surgical History:  Procedure Laterality Date   EYE SURGERY      There were no vitals filed for this visit.    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 Recently started on Sinemet. Per pt's sister her movements are better. Pt has had a 3+ month hx of difficulties with her balance and feeling more wobbly/moving slower. Had a fall in December 2021 where she fell and fractured her rib. No other falls since. Has an almost fall - when she went to sit down she almost missed the chair.    Patient is accompained by: Family member   Sister, [name]   Pertinent History PMH significant for hypertension, diabetes, reflux disease, allergies, sleep apnea and borderline obesity, hx of rib fx s/p fall    Limitations Walking;House hold activities    Diagnostic tests CT shows generalized atrophy and white matter disease , DaTSCAN results are suggestive of Parkinsonian syndromes.    Patient Stated Goals Wants to work  on walking/balance.    Currently in Pain? No/denies                [facility] PT Assessment - 01/09/21 0727       Assessment   Medical Diagnosis Parkinsonism possible PSP    Referring Provider (PT) [name], MD    Onset Date/Surgical Date 12/24/20    Hand Dominance Right    Prior Therapy None      Precautions   Precautions Fall      Balance Screen   Has the patient fallen in the past 6 months No    Has the patient had a decrease in activity level because of a fear of falling?  No    Is the patient reluctant to leave their home because of a fear of falling?  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      Sensation   Light Touch Appears Intact      Coordination   Gross Motor Movements are Fluid and Coordinated No      Posture/Postural Control   Posture/Postural Control Postural limitations    Postural Limitations Forward head;Rounded Shoulders      Tone   Assessment Location Right Lower Extremity      ROM / Strength   AROM / PROM / Strength Strength      Strength   Strength Assessment Site Knee;Hip;Ankle    Right/Left Hip Right;Left    Right Hip Flexion 4+/5    Left Hip Flexion 5/5    Right/Left Knee Left;Right    Right Knee Flexion 5/5    Right Knee Extension 5/5    Left Knee Flexion 5/5    Left Knee Extension 5/5    Right/Left Ankle Right;Left    Right Ankle Dorsiflexion 5/5    Left Ankle  Dorsiflexion 5/5      Transfers   Transfers Sit to Stand;Stand to Sit    Sit to Stand 5: Supervision    Five time sit to stand comments  21.50 seconds, no UE support, wide BOS    Stand to Sit 5: Supervision      Ambulation/Gait   Ambulation/Gait Yes    Ambulation/Gait Assistance 5: Supervision    Ambulation/Gait Assistance Details Pt wearing small heels with an open back. Discussed with pt wearing sneakers to future PT sessions    Assistive device None    Gait Pattern Step-through pattern;Decreased arm swing - right    Ambulation Surface Level;Indoor    Gait velocity 10.87 = 3.01 ft/sec      Standardized Balance Assessment   Standardized Balance Assessment Mini-BESTest;Timed Up and Go Test      Mini-BESTest   Sit To Stand Normal: Comes to stand without use of hands and stabilizes independently.    Rise to Toes Normal: Stable for 3 s with maximum height.    Stand on one leg (left) Moderate: < 20 s   2.1, 4.44   Stand on one leg (right) Moderate: < 20 s   4.06, 2.84   Stand on one leg - lowest score 1    Compensatory Stepping Correction - Forward Normal: Recovers independently with a single, large step (second realignement is allowed).    Compensatory Stepping Correction - Backward Moderate: More than one step is required to recover equilibrium    Compensatory Stepping Correction - Left Lateral Severe: Falls, or cannot step   does not step   Compensatory Stepping Correction - Right Lateral Severe:  Falls, or cannot step   does not step   Stepping Corredtion Lateral - lowest score 0    Stance - Feet together, eyes open, firm surface  Normal: 30s    Incline - Eyes Closed Normal: Stands independently 30s and aligns with gravity    Timed UP & GO with Dual Task Moderate: Dual Task affects either counting OR walking (>10%) when compared to the TUG without Dual Task.      Timed Up and Go Test   Normal TUG (seconds) 11.88    Manual TUG (seconds) 14.75    Cognitive TUG (seconds) 13.72   at  77 counting backwards by 3     High Level Balance  High Level Balance Comments Push and release test: forwards - 1 step, posterior - 2 steps      RLE Tone   RLE Tone Mild;Other (comment)   rigidity                       Objective measurements completed on examination: See above findings.                PT Education - 01/09/21 1109     Education Details Clinical findings, POC    Person(s) Educated Patient   pt's sister   Methods Explanation    Comprehension Verbalized understanding              PT Short Term Goals - 01/09/21 1112       PT SHORT TERM GOAL #1   Title Pt will be independent with initial HEP in order to build upon functional gains made in threrapy. ALL STGS DUE 02/06/21    Time 4    Period Weeks    Status New    Target Date 02/06/21      PT SHORT TERM GOAL #2   Title Pt will decr 5x sit <> stand with no UE support to 18.5 seconds or less in order to decr fall risk/improve functional BLE strength.    Baseline 21.50 seconds    Time 4    Period Weeks    Status New      PT SHORT TERM GOAL #3   Title Pt will decr manual TUG to 13 seconds or less in order to demo decr fall risk.    Baseline 14.75 seconds    Time 4    Period Weeks    Status New      PT SHORT TERM GOAL #4   Title Pt and pt's family will verbalize understanding of fall prevention in the home.    Time 4    Period Weeks    Status New               PT Long Term Goals - 01/09/21 1113       PT LONG TERM GOAL #1   Title Pt will be independent with final HEP in order to build upon functional gains made in threrapy. ALL LTGS DUE 03/06/21    Time 8    Period Weeks    Status New    Target Date 03/06/21      PT LONG TERM GOAL #2   Title Pt will decr 5x sit <> stand with no UE support to 16.5 seconds or less in order to decr fall risk/improve functional BLE strength.    Baseline 21.5 seconds    Time 8    Period Weeks    Status New      PT LONG TERM GOAL  #3   Title Pt will verbalize understanding of local Parkinson's disease resources.    Time 8    Period Weeks    Status New      PT LONG TERM GOAL #4   Title miniBEST goal to be written as appropriate.    Baseline Did not get to finish assessing.    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 Pt's PMH is significant [id number], diabetes, reflux disease, sleep apnea and borderline obesity, hx of rib fx s/p fall.  The following deficits were present during the exam: impaired balance, decr timing/coordination of gait, postural abnormalities, difficulties with dual tasking, impaired visual tracking, decr strength . Based on manual TUG, 5x sit <> stand pt is an incr risk for falls. Will finish miniBEST next session to further assess fall risk. 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 PMH significant for hypertension, diabetes, reflux disease, sleep apnea and borderline obesity, hx of rib fx s/p fall.    Examination-Activity Limitations Transfers;Stairs;Locomotion Level    Examination-Participation Restrictions Community Activity;Driving;Occupation    Stability/Clinical Decision Making Evolving/Moderate complexity    Clinical Decision Making Moderate    Rehab Potential Good    PT Frequency 2x  / week    PT Duration 12 weeks    PT Treatment/Interventions ADLs/Self Care Home Management;DME Instruction;Therapeutic activities;Functional mobility training;Stair training;Gait training;Therapeutic exercise;Balance training;Neuromuscular re-education;Patient/family education;Vestibular    PT Next Visit Plan Finish miniBEST and write goal. Get pt started on initial HEP - standing PWR moves, lateral/posterior stepping strategies. Gait with arm swing.    Consulted and Agree with Plan of Care Patient;Family member/caregiver    Family Member Consulted pt's sister, [name]             Patient will benefit from skilled therapeutic intervention in order to improve the following deficits and impairments:  Abnormal gait, Decreased activity tolerance, Decreased coordination, Decreased strength, Impaired tone, Postural dysfunction, Difficulty walking, Decreased balance, Decreased safety awareness  Visit Diagnosis: Unsteadiness on feet  Other abnormalities of gait and mobility  Abnormal posture  Other symptoms and signs involving the nervous system     Problem List Patient Active Problem List   Diagnosis Date Noted   Decreased dorsalis pedis pulse 11/17/2020   Tachycardia 08/11/2020   Cough 08/11/2020   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   Pure hypercholesterolemia 01/23/2020   Vitamin D deficiency 10/23/2019   Right hip pain 05/15/2018   Estrogen deficiency 05/15/2018   Class 1 obesity due to excess calories with serious comorbidity and body mass index (BMI) of 31.0 to 31.9 in adult 05/15/2018   Diabetes mellitus with stage 2 chronic kidney disease ([place]) 01/11/2018   Benign hypertension with chronic kidney disease, stage II 01/11/2018    Arliss Journey, PT, DPT  01/09/2021, 11:22 AM  [facility] [address] [place], [place], [address] Phone: [phone]   Fax:  [phone]  Name: Oriana Vilikia Watkins  Larson MRN: [id number] Date of Birth: 07-18-1950

## 2021-01-09 NOTE — Patient Instructions (Signed)
Memory Compensation Strategies  Use "WARM" strategy. W= write it down A=  associate it R=  repeat it M=  make a mental picture  You can keep a Memory Notebook. Use a 3-ring notebook with sections for the following:  calendar, important names and phone numbers, medications, doctors' names/phone numbers, "to do list"/reminders, and a section to journal what you did each day  Use a calendar to write appointments down.  Write yourself a schedule for the day.  This can be placed on the calendar or in a separate section of the Memory Notebook.  Keeping a regular schedule can help memory.  Use medication organizer with sections for each day or morning/evening pills  You may need help loading it  Keep a basket, or pegboard by the door.   Place items that you need to take out with you in the basket or on the pegboard.  You may also want to include a message board for reminders.  Use sticky notes. Place sticky notes with reminders in a place where the task is performed.  For example:  "turn off the stove" placed by the stove, "lock the door" placed on the door at eye level, "take your medications" on the bathroom mirror or by the place where you normally take your medications  Use alarms, timers, and/or a reminder app. Use while cooking to remind yourself to check on food or as a reminder to take your medicine, or as a reminder to make a call, or as a reminder to perform another task, etc.  Use a voice recorder app or small tape recorder to record important information and notes for yourself. Go back at the end of the day and listen to these.  Strategies for Improving Your Attention and Memory  Use good eye-contact Give the speaker your undivided attention Look directly at the speaker  Complete one task at a time Avoid multitasking Complete one task before starting a new one Write a note to yourself if you think of something else that needs to be done Let others know when you need quiet  time and can't be interrupted Don't answer the phone, texts, or emails while you are working on another task  Put aside distracting thoughts If you find your mind wandering, refocus your attention on the speaker Avoid off-topic comments or responses that may divert your attention  If something important comes to mind, let the speaker know and pause to write yourself a note: "Do you mind holding on a minute, I have to write something down." Put thoughts on hold and focus on salient information  Limit distractions in your environment Think about the environment around you Limit background noise by turning off the TV or music, putting your phone away Close the door and work in quiet  Use active listening Actively participate in the conversation to stay focused Paraphrase what you have heard to include the most important details Adding some associations may help you remember Ask questions to clarify certain points Summarize the speaker's comments periodically Avoid nodding your head and using "mhm" responses as these are more passive and don't help your attention  Alert the other person/people It may be helpful to alert your listener to the fact that you may need reminders to keep on track Tell the speaker in advance that you may need to stop them and have them repeat salient information If you lose focus, interject and let the person know, "I'm sorry, I lost you, can you tell me again?"    Write down information Write down pertinent information as it comes up, such as telephone numbers, names of people, addresses, details from appointments and conversations, etc.  

## 2021-01-10 NOTE — Therapy (Signed)
[facility] [address] [place], [place], [address] Phone: [phone]   Fax:  [phone]  Speech Language Pathology Evaluation  Patient Details  Name: April Larson MRN: [id number] Date of Birth: Jun 14, 1950 Referring Provider (SLP): [name], MD   Encounter Date: 01/09/2021   End of Session - 01/10/21 1238     Visit Number 1    Number of Visits 25    Date for SLP Re-Evaluation 04/03/21    Authorization Type [employer] Medicare    SLP Start Time 0800    SLP Stop Time  0845    SLP Time Calculation (min) 45 min    Activity Tolerance Patient tolerated treatment well             Past Medical History:  Diagnosis Date   Diabetes mellitus without complication ([place])    Hypertension    Substance abuse ([place])     Past Surgical History:  Procedure Laterality Date   EYE SURGERY      There were no vitals filed for this visit.       SLP Evaluation [facility] - 01/09/21 0759       SLP Visit Information   SLP Received On 01/02/21    Referring Provider (SLP) [name], MD    Onset Date Pt reports December 2021    Medical Diagnosis Parkinson's disease, Dysarthria and anarthria      Subjective   Subjective "can't think like I use to" "sometimes my voice gets low"    Patient/Family Stated Goal improve thinking and speech      Pain Assessment   Currently in Pain? No/denies      General Information   HPI April Larson is a 70 year old right-handed woman with an underlying medical history of hypertension, diabetes, reflux disease, allergies, sleep apnea and borderline obesity, who presents for follow-up consultation of her parkinsonism.  She is accompanied by her sister, [name].  I saw her on 07/23/2020 at the request of her primary care physician, at which time she reported a several month history of difficulty with her balance, recent fall, problems with fine motor control and speech.       Balance Screen   Has the patient fallen in the past 6 months Yes    How many times? 1    Has the patient had a decrease in activity level because of a fear of falling?  Yes    Is the patient reluctant to leave their home because of a fear of falling?  No      Prior Functional Status   Cognitive/Linguistic Baseline Within functional limits    Type of Home House     Lives With Alone    Available Support Family    Vocation Full time employment   clerical assistant since 2014     Cognition   Overall Cognitive Status Impaired/Different from baseline    Area of Impairment Memory;Attention;Awareness;Problem solving    Current Attention Level Sustained    Memory Decreased short-term memory    Awareness Emergent    Problem Solving Slow processing;Requires verbal cues;Difficulty sequencing      Auditory Comprehension   Overall Auditory Comprehension Impaired    Conversation Moderately complex    Interfering Components Attention;Processing speed;Working Naval architect Mode of Expression Verbal      Verbal Expression   Overall Verbal Expression Appears within functional limits for tasks assessed  Written Expression   Dominant Hand Right      Oral Motor/Sensory Function   Overall Oral Motor/Sensory Function Impaired    Lingual Coordination Reduced      Motor Speech   Overall Motor Speech Impaired    Respiration Impaired    Level of Impairment Conversation    Phonation Low vocal intensity    Resonance Within functional limits    Articulation Within functional limitis    Intelligibility Intelligible    Effective Techniques Increased vocal intensity    Phonation Impaired    Vocal Abuses Hyperphonia    Volume Soft;Decibel Level      Standardized Assessments   Standardized Assessments  Cognitive Linguistic Quick Test   initiated; to be completed in upcoming session                             SLP Education - 01/09/21 0800     Education Details eval results, possible goals, compensations    Person(s) Educated Patient    Methods Explanation;Demonstration;Handout    Comprehension Verbalized understanding;Returned demonstration;Need further instruction              SLP Short Term Goals - 01/10/21 1245       SLP SHORT TERM GOAL #1   Title Pt will utilize memory and attention compensations to optimize performance at work and home given occasional min A over 2 sessions    Time 4    Period Weeks    Status New    Target Date 02/06/21      SLP SHORT TERM GOAL #2   Title Pt will identify and correct errors on structured therapy tasks with 80% accuracy given occasional min A over 2 sessions    Time 4    Period Weeks    Status New    Target Date 02/06/21      SLP SHORT TERM GOAL #3   Title Pt will complete dysarthria HEP given occasional min A over 3 sessions    Time 4    Period Weeks    Status New    Target Date 02/06/21      SLP SHORT TERM GOAL #4   Title Pt will sustain loud /a/ with average of low 90s dB over 3 sessions    Time 4    Period Weeks    Status New    Target Date 02/06/21      SLP SHORT TERM GOAL #5   Title Pt will maintain WNL conversational volume (70-72 dB) during 5 minute conversation given occasional min A over 2 sessions    Time 4    Period Weeks    Status New    Target Date 02/06/21              SLP Long Term Goals - 01/10/21 1247       SLP LONG TERM GOAL #1   Title Pt will utilize memory and attention compensations to optimize performance at work and home given rare min A over 2 sessions    Time 8    Period Weeks    Status New    Target Date 03/06/21      SLP LONG TERM GOAL #2   Title Pt will identify and correct errors on structured therapy tasks with 90% accuracy given rare min A over 2 sessions    Time 8    Period Weeks    Status New    Target Date 03/06/21  SLP LONG TERM GOAL  #3   Title Pt will complete dysarthria HEP BID > 2 weeks    Time 12    Period Weeks    Status New    Target Date 04/03/21      SLP LONG TERM GOAL #4   Title Pt will maintain WNL conversational volume (70-72 dB) during 15+ minute conversation given rare min A over 2 sessions    Time 12    Period Weeks    Status New    Target Date 04/03/21              Plan - 01/09/21 2505     Clinical Impression Statement "[place]" was referred for OPST to address dysarthria and change in cogition secondary to Parkinson's disease. Pt was accompanied by her sister. Pt reports she has noticed decline in cognition over last year. Pt lives alone and is working full time as Environmental education officer. Pt reports she often has to "stop and restart" due to slower than baseline processing, particularly at work. Pt reports she is currently managing iADLs without difficulty. She also reported inconsistent vocal intensity, which ranges from low (per patient) and to high volume (per sister). Pt is occasionally asked to repeat herself in conversation. Cognitive Linguistic Quick Test initiated this session and to be completed in upcoming session due to time constraints. Inconsistent error awareness exhibited and some mild reduced recall demonstrated. Due to cognitive and speech decline impacting her daily life, skilled ST is warranted to optimize pt's functional independence, safety, and communication effectiveness.    Speech Therapy Frequency 2x / week    Duration 12 weeks (scheduled 12 weeks but anticipate d/c after 8 weeks if significant progress achieved)   Treatment/Interventions Cognitive reorganization;Multimodal communcation approach;Language facilitation;Compensatory techniques;Internal/external aids;SLP instruction and feedback;Patient/family education;Functional tasks;Cueing hierarchy;Compensatory strategies    Potential to Achieve Goals Good    Consulted and Agree with Plan of Care Patient;Family member/caregiver              Patient will benefit from skilled therapeutic intervention in order to improve the following deficits and impairments:   Cognitive communication deficit  Dysarthria and anarthria    Problem List Patient Active Problem List   Diagnosis Date Noted   Decreased dorsalis pedis pulse 11/17/2020   Tachycardia 08/11/2020   Cough 08/11/2020   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   Pure hypercholesterolemia 01/23/2020   Vitamin D deficiency 10/23/2019   Right hip pain 05/15/2018   Estrogen deficiency 05/15/2018   Class 1 obesity due to excess calories with serious comorbidity and body mass index (BMI) of 31.0 to 31.9 in adult 05/15/2018   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

## 2021-01-12 ENCOUNTER — Telehealth: Payer: Self-pay

## 2021-01-12 NOTE — Chronic Care Management (AMB) (Signed)
01/12/2021-  APPOINTMENT REMINDER   April Larson was reminded to have all medications, supplements and any blood glucose and blood pressure readings available for review with Orlando Penner, Pharm. D, at her telephone visit on 01/13/2021 at 12:00 PM.  [name], [facility] Pharmacist Assistant [phone]

## 2021-01-13 ENCOUNTER — Ambulatory Visit (INDEPENDENT_AMBULATORY_CARE_PROVIDER_SITE_OTHER): Payer: Medicare Other

## 2021-01-13 ENCOUNTER — Other Ambulatory Visit: Payer: Self-pay

## 2021-01-13 ENCOUNTER — Ambulatory Visit: Payer: Medicare Other | Admitting: Physical Therapy

## 2021-01-13 ENCOUNTER — Ambulatory Visit: Payer: Medicare Other

## 2021-01-13 DIAGNOSIS — M25611 Stiffness of right shoulder, not elsewhere classified: Secondary | ICD-10-CM | POA: Diagnosis not present

## 2021-01-13 DIAGNOSIS — R293 Abnormal posture: Secondary | ICD-10-CM | POA: Diagnosis not present

## 2021-01-13 DIAGNOSIS — M79674 Pain in right toe(s): Secondary | ICD-10-CM | POA: Diagnosis not present

## 2021-01-13 DIAGNOSIS — E1143 Type 2 diabetes mellitus with diabetic autonomic (poly)neuropathy: Secondary | ICD-10-CM | POA: Diagnosis not present

## 2021-01-13 DIAGNOSIS — I129 Hypertensive chronic kidney disease with stage 1 through stage 4 chronic kidney disease, or unspecified chronic kidney disease: Secondary | ICD-10-CM

## 2021-01-13 DIAGNOSIS — N182 Chronic kidney disease, stage 2 (mild): Secondary | ICD-10-CM

## 2021-01-13 DIAGNOSIS — B353 Tinea pedis: Secondary | ICD-10-CM | POA: Diagnosis not present

## 2021-01-13 DIAGNOSIS — R278 Other lack of coordination: Secondary | ICD-10-CM | POA: Diagnosis not present

## 2021-01-13 DIAGNOSIS — M25612 Stiffness of left shoulder, not elsewhere classified: Secondary | ICD-10-CM | POA: Diagnosis not present

## 2021-01-13 DIAGNOSIS — E1122 Type 2 diabetes mellitus with diabetic chronic kidney disease: Secondary | ICD-10-CM

## 2021-01-13 DIAGNOSIS — M25622 Stiffness of left elbow, not elsewhere classified: Secondary | ICD-10-CM | POA: Diagnosis not present

## 2021-01-13 DIAGNOSIS — M79675 Pain in left toe(s): Secondary | ICD-10-CM | POA: Diagnosis not present

## 2021-01-13 DIAGNOSIS — M25621 Stiffness of right elbow, not elsewhere classified: Secondary | ICD-10-CM | POA: Diagnosis not present

## 2021-01-13 DIAGNOSIS — R29818 Other symptoms and signs involving the nervous system: Secondary | ICD-10-CM | POA: Diagnosis not present

## 2021-01-13 DIAGNOSIS — R471 Dysarthria and anarthria: Secondary | ICD-10-CM

## 2021-01-13 DIAGNOSIS — R2689 Other abnormalities of gait and mobility: Secondary | ICD-10-CM | POA: Diagnosis not present

## 2021-01-13 DIAGNOSIS — R41841 Cognitive communication deficit: Secondary | ICD-10-CM

## 2021-01-13 DIAGNOSIS — R2681 Unsteadiness on feet: Secondary | ICD-10-CM | POA: Diagnosis not present

## 2021-01-13 NOTE — Patient Instructions (Signed)
Constant Therapy - free for 14 days  -work on this once a day until next speech therapy visit

## 2021-01-13 NOTE — Progress Notes (Signed)
Chronic Care Management Pharmacy Note  01/20/2021 Name:  April Larson MRN:  [id number] DOB:  [birth year]  Summary: Patient reports that she has been doing   Recommendations/Changes made from today's visit: Recommended patient use pill packaging system to help with medication compliance. Recommend patient be seen by Dr. Allyne Gee due to high BP readings.   Plan: Patient to start having medication  delivered and organized through pill packaging system. Patient to have office visit with Dr. Allyne Gee on 01/13/2021. Was advised by Dr. Allyne Gee that if patient has a worsening headache she needs to be seen immediately.    Subjective: April Larson is an 70 y.o. year old female who is a primary patient of Dorothyann Peng, MD.  The CCM team was consulted for assistance with disease management and care coordination needs.    Engaged with patient by telephone for follow up visit in response to provider referral for pharmacy case management and/or care coordination services.   Consent to Services:  The patient was given information about Chronic Care Management services, agreed to services, and gave verbal consent prior to initiation of services.  Please see initial visit note for detailed documentation.   Patient Care Team: Dorothyann Peng, MD as PCP - General (Internal Medicine) Sallye Lat, MD as Consulting Physician (Ophthalmology)  Recent office visits: 01/14/2021 Hypertensive Nephropathy    Recent consult visits: 01/09/2021 Speech pathology visit   Hospital visits: None in previous 6 months   Objective:  Lab Results  Component Value Date   CREATININE CANCELED 11/10/2020   BUN CANCELED 11/10/2020   GFRNONAA 65 04/15/2020   GFRAA 75 04/15/2020   NA CANCELED 11/10/2020   K CANCELED 11/10/2020   CALCIUM CANCELED 11/10/2020   CO2 CANCELED 11/10/2020   GLUCOSE CANCELED 11/10/2020    Lab Results  Component Value Date/Time   HGBA1C 7.0  (H) 11/10/2020 12:16 PM   HGBA1C 7.2 (H) 08/11/2020 02:50 PM   MICROALBUR 10 11/10/2020 01:13 PM   MICROALBUR 10 10/23/2019 03:42 PM    Last diabetic Eye exam:  Lab Results  Component Value Date/Time   HMDIABEYEEXA No Retinopathy 12/02/2020 12:00 AM    Last diabetic Foot exam: No results found for: HMDIABFOOTEX   Lab Results  Component Value Date   CHOL 176 08/11/2020   HDL 53 08/11/2020   LDLCALC 63 08/11/2020   TRIG 390 (H) 08/11/2020   CHOLHDL 3.3 10/23/2019    Hepatic Function 11/10/2020 07/23/2020 04/24/2020  Total Protein CANCELED 7.6 -  Albumin CANCELED 4.5 -  AST CANCELED 15 -  ALT CANCELED 15 -  Alk Phosphatase CANCELED 117 161(H)  Total Bilirubin CANCELED 0.2 -    Lab Results  Component Value Date/Time   TSH 1.830 04/15/2020 04:07 PM   TSH 1.991  01/16/2009 04:38 PM    CBC Latest Ref Rng & Units 11/10/2020 04/15/2020 03/27/2020  WBC 3.4 - 10.8 x10E3/uL 8.5 10.1 7.2  Hemoglobin 11.1 - 15.9 g/dL 10.4 10.7 11.7(L)  Hematocrit 34.0 - 46.6 % 39.5 37.7 37.0  Platelets 150 - 450 x10E3/uL 362 480(H) 324    Lab Results  Component Value Date/Time   VD25OH 45.2 08/11/2020 02:50 PM   VD25OH 56.1 10/23/2019 03:34 PM    Clinical ASCVD: No  The 10-year ASCVD risk score (Arnett DK, et al., 2019) is: 21.3%   Values used to calculate the score:     Age: 70 years     Sex: Female     Is Non-Hispanic African American: Yes  Diabetic: Yes     Tobacco smoker: No     Systolic Blood Pressure: 785 mmHg     Is BP treated: Yes     HDL Cholesterol: 53 mg/dL     Total Cholesterol: 176 mg/dL    Depression screen [facility] 2/9 06/19/2020 04/08/2020 08/27/2019  Decreased Interest 0 0 0  Down, Depressed, Hopeless 0 0 0  PHQ - 2 Score 0 0 0  Altered sleeping - - -  Tired, decreased energy - - -  Change in appetite - - -  Feeling bad or failure about yourself  - - -  Trouble concentrating - - -  Moving slowly or fidgety/restless - - -  Suicidal thoughts - - -  PHQ-9 Score - - -   Difficult doing work/chores - - -     Social History   Tobacco Use  Smoking Status Former   Packs/day: 1.00   Years: 34.00   Pack years: 34.00   Types: Cigarettes   Start date: 63   Quit date: 04/05/2002   Years since quitting: 18.8  Smokeless Tobacco Never   BP Readings from Last 3 Encounters:  01/20/21 124/68  01/14/21 130/82  12/24/20 (!) 172/79   Pulse Readings from Last 3 Encounters:  01/20/21 (!) 102  01/14/21 75  12/24/20 67   Wt Readings from Last 3 Encounters:  01/14/21 171 lb 3.2 oz (77.7 kg)  12/24/20 171 lb 9.6 oz (77.8 kg)  11/10/20 170 lb 6.4 oz (77.3 kg)   BMI Readings from Last 3 Encounters:  01/20/21 32.35 kg/m  01/14/21 32.35 kg/m  12/24/20 31.79 kg/m    Assessment/Interventions: Review of patient past medical history, allergies, medications, health status, including review of consultants reports, laboratory and other test data, was performed as part of comprehensive evaluation and provision of chronic care management services.   SDOH:  (Social Determinants of Health) assessments and interventions performed: No  SDOH Screenings   Alcohol Screen: Not on file  Depression (PHQ2-9): Low Risk    PHQ-2 Score: 0  Financial Resource Strain: Low Risk    Difficulty of Paying Living Expenses: Not hard at all  Food Insecurity: No Food Insecurity   Worried About Charity fundraiser in the Last Year: Never true   Ran Out of Food in the Last Year: Never true  Housing: Not on file  Physical Activity: Inactive   Days of Exercise per Week: 0 days   Minutes of Exercise per Session: 0 min  Social Connections: Not on file  Stress: No Stress Concern Present   Feeling of Stress : Not at all  Tobacco Use: Medium Risk   Smoking Tobacco Use: Former   Smokeless Tobacco Use: Never  Transportation Needs: No Transportation Needs   Lack of Transportation (Medical): No   Lack of Transportation (Non-Medical): No    CCM Care Plan  No Known  Allergies  Medications Reviewed Today     Reviewed by Glendale Chard, MD (Physician) on 01/14/21 at 1202  Med List Status: <None>   Medication Order Taking? Sig Documenting Provider Last Dose Status Informant  ACCU-CHEK FASTCLIX LANCETS [place] [id number] Yes USE TO CHECK BLOOD SUGARS TWICE DAILY Glendale Chard, MD Taking Active   ACCU-CHEK GUIDE test strip [id number] Yes USE TO CHECK BLOOD SUGARS TWICE DAILY AS DIRECTED Glendale Chard, MD Taking Active   Ascorbic Acid (VITAMIN C PO) [id number] Yes Take 1 tablet by mouth daily. [provider] Taking Active   aspirin EC 81 MG tablet [id number] Yes  Take 81 mg by mouth daily. [provider] Taking Active   B-D ULTRAFINE III SHORT PEN 31G X 8 MM MISC [id number] Yes USE AS DIRECTED TWICE DAILY Glendale Chard, MD Taking Active   Biotin 10 MG CAPS [id number] Yes Take 1 tablet by mouth daily. [provider] Taking Active   Blood Glucose Monitoring Suppl (ACCU-CHEK GUIDE ME) w/Device KIT [id number] Yes USE TO CHECK BLOOD GLUCOSE TWICE DAILY Minette Brine, FNP Taking Active   calcium carbonate (OSCAL) 1500 (600 Ca) MG TABS tablet [id number] Yes Take 600 mg of elemental calcium by mouth daily with breakfast. [provider] Taking Active Self  carbidopa-levodopa (SINEMET IR) 25-100 MG tablet [id number] Yes Take 1 tablet by mouth 3 (three) times daily. Follow titration instructions provided separately in writing. [name], MD Taking Active   Cholecalciferol 125 MCG (5000 UT) TABS [id number] Yes Take 5,000 Units by mouth daily.  [provider] Taking Active   diclofenac Sodium (VOLTAREN) 1 % GEL [id number] Yes APPLY 2 GRAMS TOPICALLY TO THE AFFECTED AREA FOUR TIMES DAILY Glendale Chard, MD Taking Active   fluticasone [facility]) 50 MCG/ACT nasal spray [id number] Yes SHAKE LIQUID AND USE 2 SPRAYS IN EACH NOSTRIL DAILY Glendale Chard, MD Taking Active   LIVALO 4 MG TABS [id number] Yes TAKE 1 TABLET BY MOUTH DAILY Glendale Chard,  MD Taking Active   loratadine (CLARITIN) 10 MG tablet [id number] Yes Take 10 mg by mouth daily. AM [provider] Taking Active   metFORMIN (GLUCOPHAGE) 500 MG tablet [id number] Yes Take 1 tablet (500 mg total) by mouth daily with breakfast. Dinner  Patient taking differently: Take 500 mg by mouth daily with breakfast.   Glendale Chard, MD Taking Active   Multiple Vitamins-Minerals (ONE-A-DAY WOMENS 50+ ADVANTAGE PO) [id number] Yes Take 1 tablet by mouth daily. [provider] Taking Active   NON FORMULARY [id number] Yes 2 each. Calm [provider] Taking Active            Med Note Jeoffrey Massed Aug 13, 2020 11:53 AM) Taking 2 at night before bed.   Omega-3 Fatty Acids (FISH OIL PO) [id number] Yes Take by mouth. [provider] Taking Active   omeprazole (PRILOSEC) 40 MG capsule [id number] Yes TAKE 1 CAPSULE BY MOUTH EVERY DAY BEFORE A MEAL Minette Brine, FNP Taking Active   pioglitazone (ACTOS) 15 MG tablet [id number] No TAKE 1 TABLET BY MOUTH DAILY Glendale Chard, MD Unknown Active   telmisartan-hydrochlorothiazide (MICARDIS HCT) 40-12.5 MG tablet [id number] Yes TAKE 1 TABLET BY MOUTH DAILY Glendale Chard, MD Taking Active   Turmeric 500 MG CAPS [id number] Yes Take 1 capsule by mouth 2 (two) times daily. [provider] Taking Active   Sharia Reeve [id number] Yes INJECT 25 UNITS SUBCUTANEOUSLY DAILY Glendale Chard, MD Taking Active   Med List Note Baird Cancer, Bailey Mech, MD 10/12/18 1448): Change Xultophy to 22 units 10/12/2018            Patient Active Problem List   Diagnosis Date Noted   Decreased dorsalis pedis pulse 11/17/2020   Tachycardia 08/11/2020   Cough 08/11/2020   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   Pure hypercholesterolemia 01/23/2020   Vitamin D deficiency 10/23/2019   Right hip pain 05/15/2018   Estrogen deficiency 05/15/2018   Class 1 obesity due to excess calories with serious comorbidity and body mass  index (BMI) of 31.0 to 31.9 in adult 05/15/2018   Diabetes mellitus with stage 2 chronic kidney disease ([place]) 01/11/2018   Benign  hypertension with chronic kidney disease, stage II 01/11/2018    Immunization History  Administered Date(s) Administered   Fluad Quad(high Dose 65+) 01/01/2019, 12/27/2019   Influenza, High Dose Seasonal PF 01/12/2018, 01/01/2019   Influenza-Unspecified 12/30/2020   PFIZER(Purple Top)SARS-COV-2 Vaccination 04/26/2019, 05/18/2019, 03/22/2020, 09/22/2020   Pneumococcal Conjugate-13 10/13/2018   Pneumococcal Polysaccharide-23 03/02/2017   Tdap 09/18/2015   Zoster Recombinat (Shingrix) 11/22/2018, 02/25/2019   Zoster, Live 11/04/2015    Conditions to be addressed/monitored:  Hypertension and Diabetes  Care Plan : [place]  Updates made by Mayford Knife, [place] since 01/20/2021 12:00 AM     Problem: HTN, DM II   Priority: High     Long-Range Goal: Disease Management   Start Date: 01/13/2021  Recent Progress: On track  Priority: High  Note:   Current Barriers:  Unable to independently monitor therapeutic efficacy Does not adhere to prescribed medication regimen Does not maintain contact with provider office Does not contact provider office for questions/concerns  Pharmacist Clinical Goal(s):  Patient will achieve adherence to monitoring guidelines and medication adherence to achieve therapeutic efficacy through collaboration with PharmD and provider.   Interventions: 1:1 collaboration with Glendale Chard, MD regarding development and update of comprehensive plan of care as evidenced by provider attestation and co-signature Inter-disciplinary care team collaboration (see longitudinal plan of care) Comprehensive medication review performed; medication list updated in electronic medical record  Hypertension (BP goal <130/80) -Controlled -Current treatment: Telmisartan - Hydrochlorothiazide 40-12.5 mg tablet once per day  -Current  home readings: 170/80, patient reports her BP readings have been high   -I am going to consult with Dr. Baird Cancer to determine next steps for when the patient should be seen.  -Current dietary habits: she is not adding any salt to her food, she is using Mrs. Dash to season her food  -Current exercise habits: she is still walking her dog  -Denies hypotensive/hypertensive symptoms -She is no longer using [employer] to season her food, I congratulated  her on no longer using high salt seasonings.  -Educated on BP goals and benefits of medications for prevention of heart attack, stroke and kidney damage; -Counseled to monitor BP at home at least once per day, document, and provide log at future appointments -Collaborated with PCP patient to be seen on tomorrow at 11:30 am by Dr. Baird Cancer. -Recommended to continue current medication  Diabetes (A1c goal <7%) -Not ideally controlled -Current medications: Metformin 500 mg taking 1 tablet by mouth daily  Pioglitazone 15 mg taking 1 tablet by mouth daily Xultophy 100-3.6 unit-mg/mL - inject 25 units subcutaneously at night Has been on it for 3-4 years  -Current home glucose readings fasting glucose: 112, 71   Patient reports that her readings have been in the 70's for the past couple of weeks  -Reports hypoglycemic/hyperglycemic symptoms  -She reports that she feels disoriented when her readings are low  -Current meal patterns:  breakfast: Oatmeal and Boost  lunch: what she ate the night before, greens, macaroni cheese, and baked chicken  dinner: Baked chicken, with rice or macaroni and cheese, and vegetables snacks: grapes, cheese sticks drinks: mostly water, with diet [place] Dry ginger ale, or diet pepsi -Current exercise: she is walking her dog daily. -Educated on Prevention and management of hypoglycemic episodes; -Counseled to check feet daily and get yearly eye exams -Collaborated with PCP to change patients current diabetes regimen due to low  readings. Will also recommend patient be prescribed GVOKE pen.   Patient Goals/Self-Care Activities Patient  will:  - take medications as prescribed  Follow Up Plan: The patient has been provided with contact information for the care management team and has been advised to call with any health related questions or concerns.        Medication Assistance: None required.  Patient affirms current coverage meets needs.  Compliance/Adherence/Medication fill history: Care Gaps: Influenza HD- [facility] on [address] 2 weeks ago  Foot Exam - completed - she is going today to pick up shoes for her Diabetes, she went to [place] for her appointment   Star-Rating Drugs: Livalo 4 mg tablet  Metformin 500 mg tablet  Pioglitazone 15 mg tablet Telmisartan-HCTZ 40-12.5 MG    Patient's preferred pharmacy is:  [facility], [place] - [address] AT [facility] [address] [address] Phone: [phone] Fax: [phone]  Uses pill box? No - patient reports keeping them in a bottle  Pt endorses 80% compliance  We discussed: Benefits of medication synchronization, packaging and delivery as well as enhanced pharmacist oversight with [facility]. Patient decided to: Utilize [facility] pharmacy for medication synchronization, packaging and delivery  Care Plan and Follow Up Patient Decision:  Patient agrees to Care Plan and Follow-up.  Plan: The patient has been provided with contact information for the care management team and has been advised to call with any health related questions or concerns.   Orlando Penner, PharmD Clinical Pharmacist [facility] [phone]

## 2021-01-14 ENCOUNTER — Telehealth: Payer: Self-pay

## 2021-01-14 ENCOUNTER — Ambulatory Visit (INDEPENDENT_AMBULATORY_CARE_PROVIDER_SITE_OTHER): Payer: Medicare Other | Admitting: Internal Medicine

## 2021-01-14 ENCOUNTER — Encounter: Payer: Self-pay | Admitting: Internal Medicine

## 2021-01-14 VITALS — BP 130/82 | HR 75 | Temp 97.7°F | Ht 61.0 in | Wt 171.2 lb

## 2021-01-14 DIAGNOSIS — Z79899 Other long term (current) drug therapy: Secondary | ICD-10-CM | POA: Diagnosis not present

## 2021-01-14 DIAGNOSIS — Z6832 Body mass index (BMI) 32.0-32.9, adult: Secondary | ICD-10-CM

## 2021-01-14 DIAGNOSIS — N182 Chronic kidney disease, stage 2 (mild): Secondary | ICD-10-CM

## 2021-01-14 DIAGNOSIS — F4321 Adjustment disorder with depressed mood: Secondary | ICD-10-CM

## 2021-01-14 DIAGNOSIS — G20 Parkinson's disease: Secondary | ICD-10-CM | POA: Diagnosis not present

## 2021-01-14 DIAGNOSIS — I129 Hypertensive chronic kidney disease with stage 1 through stage 4 chronic kidney disease, or unspecified chronic kidney disease: Secondary | ICD-10-CM

## 2021-01-14 DIAGNOSIS — E6609 Other obesity due to excess calories: Secondary | ICD-10-CM

## 2021-01-14 NOTE — Therapy (Signed)
[facility] [address] [place], [place], [address] Phone: [phone]   Fax:  [phone]  Speech Language Pathology Treatment  Patient Details  Name: April Larson MRN: [id number] Date of Birth: 1951-01-25 Referring Provider (SLP): [name], MD   Encounter Date: 01/13/2021   End of Session - 01/14/21 0854     Visit Number 2    Number of Visits 25    Date for SLP Re-Evaluation 04/03/21    Authorization Type UHC Medicare    SLP Start Time 1746    SLP Stop Time  1830    SLP Time Calculation (min) 44 min    Activity Tolerance Patient tolerated treatment well             Past Medical History:  Diagnosis Date   Diabetes mellitus without complication ([place])    Hypertension    Substance abuse ([place])     Past Surgical History:  Procedure Laterality Date   EYE SURGERY      There were no vitals filed for this visit.   Subjective Assessment - 01/14/21 0842     Subjective "I got a message that I didn't have to come (to PT)"    Currently in Pain? No/denies                   ADULT SLP TREATMENT - 01/13/21 1748       General Information   Behavior/Cognition Alert;Cooperative;Pleasant mood;Confused      Treatment Provided   Treatment provided Cognitive-Linquistic      Cognitive-Linquistic Treatment   Treatment focused on Cognition    Skilled Treatment Pt missed earlier PT appointment with reports she received a message on MyChart. SLP provided assistance to locate message but we were unlocate targeted message. SLP questions cognitive component related to incident. Due to frustration, SLP re-directed patient to assess recall of day. In verbal summary of day, pt's sister provided usual cues to identify other tasks completed in 8 hour work shift, as pt stated "I just made copies." SLP completed CLQT this session, with results reviewed with patient and sister. Pt noted to have  stop/started different sections of line x3, which resulted in inaccurate maze. Pt able noted with usual perseverations of designs during design generation tasks. Pt stated "I had to make the same design because I couldn't think of anymore." Overall CLQT score was rated mild, with moderate executive functions and mild attention & visuospatial deficits indicated.      Assessment / Recommendations / Plan   Plan Continue with current plan of care      Progression Toward Goals   Progression toward goals Progressing toward goals              SLP Education - 01/14/21 0853     Education Details CLQT results, HEP, Constant Therapy    Person(s) Educated Patient    Methods Explanation;Demonstration;Handout    Comprehension Verbalized understanding;Returned demonstration;Need further instruction              SLP Short Term Goals - 01/13/21 1749       SLP SHORT TERM GOAL #1   Title Pt will utilize memory and attention compensations to optimize performance at work and home given occasional min A over 2 sessions    Time 4    Period Weeks    Status On-going    Target Date 02/06/21      SLP SHORT TERM GOAL #2   Title Pt will  identify and correct errors on structured therapy tasks with 80% accuracy given occasional min A over 2 sessions    Time 4    Period Weeks    Status On-going    Target Date 02/06/21      SLP SHORT TERM GOAL #3   Title Pt will complete dysarthria HEP given occasional min A over 3 sessions    Time 4    Period Weeks    Status On-going    Target Date 02/06/21      SLP SHORT TERM GOAL #4   Title Pt will sustain loud /a/ with average of low 90s dB over 3 sessions    Time 4    Period Weeks    Status On-going    Target Date 02/06/21      SLP SHORT TERM GOAL #5   Title Pt will maintain WNL conversational volume (70-72 dB) during 5 minute conversation given occasional min A over 2 sessions    Time 4    Period Weeks    Status On-going    Target Date 02/06/21               SLP Long Term Goals - 01/13/21 1757       SLP LONG TERM GOAL #1   Title Pt will utilize memory and attention compensations to optimize performance at work and home given rare min A over 2 sessions    Time 8    Period Weeks    Status On-going    Target Date 03/06/21      SLP LONG TERM GOAL #2   Title Pt will identify and correct errors on structured therapy tasks with 90% accuracy given rare min A over 2 sessions    Time 8    Period Weeks    Status On-going    Target Date 03/06/21      SLP LONG TERM GOAL #3   Title Pt will complete dysarthria HEP BID > 2 weeks    Time 12    Period Weeks    Status On-going    Target Date 04/03/21      SLP LONG TERM GOAL #4   Title Pt will maintain WNL conversational volume (70-72 dB) during 15+ minute conversation given rare min A over 2 sessions    Time 12    Period Weeks    Status New    Target Date 04/03/21              Plan - 01/13/21 1749     Clinical Impression Statement "April Larson" was referred for OPST to address dysarthria and 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 Pt was accompanied by her sister. Pt reports she has noticed decline in cognition over last year. Pt lives alone and is working full time as Environmental education officer. Pt reports she often has to "stop and restart" due to slower than baseline processing, particularly at work. Pt reports she is currently managing iADLs without difficulty. She also reported inconsistent vocal intensity, which ranges from low (per patient) and to high volume (per sister). Cognitive Linguistic Quick Test completed in upcoming session, with overall mild cognitive linguistic deficits indicated (mild attention and visuospatial & moderate for executive function). Possible confusion exhibited for appointment managment via MyChart indicated this session. Due to cognitive and speech decline impacting daily living tasks, skilled ST is warranted to  optimize pt's functional independence, safety, and communication effectiveness.    Speech Therapy Frequency 2x / week  Duration 12 weeks    Treatment/Interventions Cognitive reorganization;Multimodal communcation approach;Language facilitation;Compensatory techniques;Internal/external aids;SLP instruction and feedback;Patient/family education;Functional tasks;Cueing hierarchy;Compensatory strategies    Potential to Achieve Goals Good    Consulted and Agree with Plan of Care Patient;Family member/caregiver             Patient will benefit from skilled therapeutic intervention in order to improve the following deficits and impairments:   Cognitive communication deficit  Dysarthria and anarthria    Problem List Patient Active Problem List   Diagnosis Date Noted   Decreased dorsalis pedis pulse 11/17/2020   Tachycardia 08/11/2020   Cough 08/11/2020   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   Pure hypercholesterolemia 01/23/2020   Vitamin D deficiency 10/23/2019   Right hip pain 05/15/2018   Estrogen deficiency 05/15/2018   Class 1 obesity due to excess calories with serious comorbidity and body mass index (BMI) of 31.0 to 31.9 in adult 05/15/2018   Diabetes mellitus with stage 2 chronic kidney disease ([place]) 01/11/2018   Benign hypertension with chronic kidney disease, stage II 01/11/2018    Alinda Deem, MA CCC-SLP 01/14/2021, 9:01 AM  [facility] [address] [place], [place], [address] Phone: [phone]   Fax:  [phone]   Name: April Larson MRN: [id number] Date of Birth: Dec 09, 1950

## 2021-01-14 NOTE — Progress Notes (Signed)
Earleen Newport as a Education administrator for Maximino Greenland, MD.,have documented all relevant documentation on the behalf of Maximino Greenland, MD,as directed by  Maximino Greenland, MD while in the presence of Maximino Greenland, MD.  This visit occurred during the SARS-CoV-2 public health emergency.  Safety protocols were in place, including screening questions prior to the visit, additional usage of staff PPE, and extensive cleaning of exam room while observing appropriate contact time as indicated for disinfecting solutions.  Subjective:     Patient ID: April Larson , female    DOB: March 18, 1951 , 70 y.o.   MRN: [id number]   Chief Complaint  Patient presents with   Hypertension     HPI  She is here today for HTN check. She reports compliance with meds. She denies headaches, chest pain and shortness of breath.  She states CCM pharmacist, Coralyn Helling had requested that she check her BP nightly. States most recent readings were 170s/90s. No associated symptoms. Admits BP meter she uses is at least 2 years ago. Unfortunately, she did not bring her meter in today.   Hypertension This is a chronic problem. The current episode started more than 1 year ago. Condition status: FAIR CONTROL. Pertinent negatives include no anxiety. The current treatment provides moderate improvement.    Past Medical History:  Diagnosis Date   Diabetes mellitus without complication ([place])    Hypertension    Substance abuse ([place])      Family History  Problem Relation Age of Onset   Cancer Mother    Diabetes Father    Cancer Brother    Breast cancer Maternal Aunt      Current Outpatient Medications:    ACCU-CHEK FASTCLIX LANCETS MISC, USE TO CHECK BLOOD SUGARS TWICE DAILY, Disp: 100 each, Rfl: 11   ACCU-CHEK GUIDE test strip, USE TO CHECK BLOOD SUGARS TWICE DAILY AS DIRECTED, Disp: 100 strip, Rfl: 2   Ascorbic Acid (VITAMIN C PO), Take 1 tablet by mouth daily., Disp: , Rfl:    aspirin EC 81 MG tablet, Take  81 mg by mouth daily., Disp: , Rfl:    B-D ULTRAFINE III SHORT PEN 31G X 8 MM MISC, USE AS DIRECTED TWICE DAILY, Disp: 100 each, Rfl: 11   Biotin 10 MG CAPS, Take 1 tablet by mouth daily., Disp: , Rfl:    Blood Glucose Monitoring Suppl (ACCU-CHEK GUIDE ME) w/Device KIT, USE TO CHECK BLOOD GLUCOSE TWICE DAILY, Disp: 3 kit, Rfl: 3   calcium carbonate (OSCAL) 1500 (600 Ca) MG TABS tablet, Take 600 mg of elemental calcium by mouth daily with breakfast., Disp: , Rfl:    carbidopa-levodopa (SINEMET IR) 25-100 MG tablet, Take 1 tablet by mouth 3 (three) times daily. Follow titration instructions provided separately in writing., Disp: 90 tablet, Rfl: 5   Cholecalciferol 125 MCG (5000 UT) TABS, Take 5,000 Units by mouth daily. , Disp: , Rfl:    diclofenac Sodium (VOLTAREN) 1 % GEL, APPLY 2 GRAMS TOPICALLY TO THE AFFECTED AREA FOUR TIMES DAILY, Disp: 100 g, Rfl: 2   fluticasone (FLONASE) 50 MCG/ACT nasal spray, SHAKE LIQUID AND USE 2 SPRAYS IN EACH NOSTRIL DAILY, Disp: 16 g, Rfl: 2   LIVALO 4 MG TABS, TAKE 1 TABLET BY MOUTH DAILY, Disp: 90 tablet, Rfl: 1   loratadine (CLARITIN) 10 MG tablet, Take 10 mg by mouth daily. AM, Disp: , Rfl:    metFORMIN (GLUCOPHAGE) 500 MG tablet, Take 1 tablet (500 mg total) by mouth daily with breakfast. Dinner (  Patient taking differently: Take 500 mg by mouth daily with breakfast.), Disp: 180 tablet, Rfl: 3   Multiple Vitamins-Minerals (ONE-A-DAY WOMENS 50+ ADVANTAGE PO), Take 1 tablet by mouth daily., Disp: , Rfl:    NON FORMULARY, 2 each. Calm, Disp: , Rfl:    Omega-3 Fatty Acids (FISH OIL PO), Take by mouth., Disp: , Rfl:    omeprazole (PRILOSEC) 40 MG capsule, TAKE 1 CAPSULE BY MOUTH EVERY DAY BEFORE A MEAL, Disp: 90 capsule, Rfl: 1   telmisartan-hydrochlorothiazide (MICARDIS HCT) 40-12.5 MG tablet, TAKE 1 TABLET BY MOUTH DAILY, Disp: 90 tablet, Rfl: 1   Turmeric 500 MG CAPS, Take 1 capsule by mouth 2 (two) times daily., Disp: , Rfl:    XULTOPHY 100-3.6 UNIT-MG/ML SOPN,  INJECT 25 UNITS SUBCUTANEOUSLY DAILY, Disp: 27 mL, Rfl: 3   pioglitazone (ACTOS) 15 MG tablet, TAKE 1 TABLET BY MOUTH DAILY, Disp: 90 tablet, Rfl: 1   No Known Allergies   Review of Systems  Constitutional: Negative.   Respiratory: Negative.    Cardiovascular: Negative.   Gastrointestinal: Negative.   Neurological: Negative.   Psychiatric/Behavioral: Negative.      Today's Vitals   01/14/21 1148  BP: 130/82  Pulse: 75  Temp: 97.7 F (36.5 C)  Weight: 171 lb 3.2 oz (77.7 kg)  Height: 5' 1" (1.549 m)  PainSc: 0-No pain   Body mass index is 32.35 kg/m.  Wt Readings from Last 3 Encounters:  01/14/21 171 lb 3.2 oz (77.7 kg)  12/24/20 171 lb 9.6 oz (77.8 kg)  11/10/20 170 lb 6.4 oz (77.3 kg)    Objective:  Physical Exam Vitals and nursing note reviewed.  Constitutional:      Appearance: Normal appearance.  HENT:     Head: Normocephalic and atraumatic.     Nose:     Comments: Masked     Mouth/Throat:     Comments: Masked  Eyes:     Extraocular Movements: Extraocular movements intact.  Cardiovascular:     Rate and Rhythm: Normal rate and regular rhythm.     Heart sounds: Normal heart sounds.  Pulmonary:     Effort: Pulmonary effort is normal.     Breath sounds: Normal breath sounds.  Musculoskeletal:     Cervical back: Normal range of motion.  Skin:    General: Skin is warm.  Neurological:     General: No focal deficit present.     Mental Status: She is alert.  Psychiatric:        Mood and Affect: Mood normal.        Behavior: Behavior normal.        Assessment And Plan:     1. Hypertensive nephropathy Comments: Chronic, controlled. NO med changes today. Encouraged to follow low sodium diet.  She agrees to RTO next week for nurse visit. She is encouraged to bring in her home BP cuff so we can check it's accuracy.   2. Chronic renal disease, stage II Comments: Chronic, I will check renal function at her next visit. Encouraged to also stay well hydrated.    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 Guadelupe Sabin input. Importance of medication compliance was discussed with the patient.   4. Adjustment disorder with depressed mood Comments: She is trying to get acclimated to her new diagnosis. She agrees to therapy to work through this.  - Ambulatory referral to Psychology  5. Class 1 obesity due to excess calories with serious comorbidity and body mass index (  BMI) of 32.0 to 32.9 in adult Comments: She is encouraged to strive for BMI less than 30 to decrease cardiac risk. Advised to aim for at least 150 minutes of exercise per week.   6. Polypharmacy Comments: The ActX pharmacogenomics process and benefits of testing was discussed with the patient. After the discussion, the patient made an informed consent to testing and the sample was collected and mailed to the external lab. - [place]   Patient was given opportunity to ask questions. Patient verbalized understanding of the plan and was able to repeat key elements of the plan. All questions were answered to their satisfaction.   I, Maximino Greenland, MD, have reviewed all documentation for this visit. The documentation on 01/14/21 for the exam, diagnosis, procedures, and orders are all accurate and complete.   IF YOU HAVE BEEN REFERRED TO A SPECIALIST, IT MAY TAKE 1-2 WEEKS TO SCHEDULE/PROCESS THE REFERRAL. IF YOU HAVE NOT HEARD FROM US/SPECIALIST IN TWO WEEKS, PLEASE GIVE [place] A CALL AT [phone].   THE PATIENT IS ENCOURAGED TO PRACTICE SOCIAL DISTANCING DUE TO THE COVID-19 PANDEMIC.

## 2021-01-14 NOTE — Patient Instructions (Signed)
Managing Your Hypertension Hypertension, also called high blood pressure, is when the force of the blood pressing against the walls of the arteries is too strong. Arteries are blood vessels that carry blood from your heart throughout your body. Hypertension forces the heart to work harder to pump blood and may cause the arteries tobecome narrow or stiff. Understanding blood pressure readings Your personal target blood pressure may vary depending on your medical conditions, your age, and other factors. A blood pressure reading includes a higher number over a lower number. Ideally, your blood pressure should be below 120/80. You should know that: The first, or top, number is called the systolic pressure. It is a measure of the pressure in your arteries as your heart beats. The second, or bottom number, is called the diastolic pressure. It is a measure of the pressure in your arteries as the heart relaxes. Blood pressure is classified into four stages. Based on your blood pressure reading, your health care provider may use the following stages to determine what type of treatment you need, if any. Systolic pressure and diastolicpressure are measured in a unit called mmHg. Normal Systolic pressure: below 120. Diastolic pressure: below 80. Elevated Systolic pressure: 120-129. Diastolic pressure: below 80. Hypertension stage 1 Systolic pressure: 130-139. Diastolic pressure: 80-89. Hypertension stage 2 Systolic pressure: 140 or above. Diastolic pressure: 90 or above. How can this condition affect me? Managing your hypertension is an important responsibility. Over time, hypertension can damage the arteries and decrease blood flow to important parts of the body, including the brain, heart, and kidneys. Having untreated or uncontrolled hypertension can lead to: A heart attack. A stroke. A weakened blood vessel (aneurysm). Heart failure. Kidney damage. Eye damage. Metabolic syndrome. Memory and  concentration problems. Vascular dementia. What actions can I take to manage this condition? Hypertension can be managed by making lifestyle changes and possibly by taking medicines. Your health care provider will help you make a plan to bring yourblood pressure within a normal range. Nutrition  Eat a diet that is high in fiber and potassium, and low in salt (sodium), added sugar, and fat. An example eating plan is called the Dietary Approaches to Stop Hypertension (DASH) diet. To eat this way: Eat plenty of fresh fruits and vegetables. Try to fill one-half of your plate at each meal with fruits and vegetables. Eat whole grains, such as whole-wheat pasta, brown rice, or whole-grain bread. Fill about one-fourth of your plate with whole grains. Eat low-fat dairy products. Avoid fatty cuts of meat, processed or cured meats, and poultry with skin. Fill about one-fourth of your plate with lean proteins such as fish, chicken without skin, beans, eggs, and tofu. Avoid pre-made and processed foods. These tend to be higher in sodium, added sugar, and fat. Reduce your daily sodium intake. Most people with hypertension should eat less than 1,500 mg of sodium a day.  Lifestyle  Work with your health care provider to maintain a healthy body weight or to lose weight. Ask what an ideal weight is for you. Get at least 30 minutes of exercise that causes your heart to beat faster (aerobic exercise) most days of the week. Activities may include walking, swimming, or biking. Include exercise to strengthen your muscles (resistance exercise), such as weight lifting, as part of your weekly exercise routine. Try to do these types of exercises for 30 minutes at least 3 days a week. Do not use any products that contain nicotine or tobacco, such as cigarettes, e-cigarettes, and chewing   tobacco. If you need help quitting, ask your health care provider. Control any long-term (chronic) conditions you have, such as high  cholesterol or diabetes. Identify your sources of stress and find ways to manage stress. This may include meditation, deep breathing, or making time for fun activities.  Alcohol use Do not drink alcohol if: Your health care provider tells you not to drink. You are pregnant, may be pregnant, or are planning to become pregnant. If you drink alcohol: Limit how much you use to: 0-1 drink a day for women. 0-2 drinks a day for men. Be aware of how much alcohol is in your drink. In the [place] one drink equals one 12 oz bottle of beer (355 mL), one 5 oz glass of wine (148 mL), or one 1 oz glass of hard liquor (44 mL). Medicines Your health care provider may prescribe medicine if lifestyle changes are not enough to get your blood pressure under control and if: Your systolic blood pressure is 130 or higher. Your diastolic blood pressure is 80 or higher. Take medicines only as told by your health care provider. Follow the directions carefully. Blood pressure medicines must be taken as told by your health care provider. The medicine does not work as well when you skip doses. Skippingdoses also puts you at risk for problems. Monitoring Before you monitor your blood pressure: Do not smoke, drink caffeinated beverages, or exercise within 30 minutes before taking a measurement. Use the bathroom and empty your bladder (urinate). Sit quietly for at least 5 minutes before taking measurements. Monitor your blood pressure at home as told by your health care provider. To do this: Sit with your back straight and supported. Place your feet flat on the floor. Do not cross your legs. Support your arm on a flat surface, such as a table. Make sure your upper arm is at heart level. Each time you measure, take two or three readings one minute apart and record the results. You may also need to have your blood pressure checked regularly by your healthcare provider. General information Talk with your health care  provider about your diet, exercise habits, and other lifestyle factors that may be contributing to hypertension. Review all the medicines you take with your health care provider because there may be side effects or interactions. Keep all visits as told by your health care provider. Your health care provider can help you create and adjust your plan for managing your high blood pressure. Where to find more information National Heart, Lung, and Blood Institute: www.nhlbi.nih.gov American Heart Association: www.heart.org Contact a health care provider if: You think you are having a reaction to medicines you have taken. You have repeated (recurrent) headaches. You feel dizzy. You have swelling in your ankles. You have trouble with your vision. Get help right away if: You develop a severe headache or confusion. You have unusual weakness or numbness, or you feel faint. You have severe pain in your chest or abdomen. You vomit repeatedly. You have trouble breathing. These symptoms may represent a serious problem that is an emergency. Do not wait to see if the symptoms will go away. Get medical help right away. Call your local emergency services (911 in the [place] Do not drive yourself to the hospital. Summary Hypertension is when the force of blood pumping through your arteries is too strong. If this condition is not controlled, it may put you at risk for serious complications. Your personal target blood pressure may vary depending on your medical conditions,   your age, and other factors. For most people, a normal blood pressure is less than 120/80. Hypertension is managed by lifestyle changes, medicines, or both. Lifestyle changes to help manage hypertension include losing weight, eating a healthy, low-sodium diet, exercising more, stopping smoking, and limiting alcohol. This information is not intended to replace advice given to you by your health care provider. Make sure you discuss any questions  you have with your healthcare provider. Document Revised: 04/27/2019 Document Reviewed: 02/20/2019 Elsevier Patient Education  2022 Elsevier Inc.  

## 2021-01-14 NOTE — Chronic Care Management (AMB) (Signed)
    Chronic Care Management Pharmacy Assistant   Name: April Larson  MRN: [id number] DOB: 1951/01/07  Reason for Encounter: ActX Pharmacogenetic Study   01/14/2021- The ActX pharmacogenomics process and benefits of testing was discussed with the patient. After the discussion, the patient made an informed consent to testing and the sample was collected and mailed to the external lab.   [name], [place] Pharmacist Assistant [phone]

## 2021-01-16 ENCOUNTER — Telehealth: Payer: Self-pay

## 2021-01-16 NOTE — Chronic Care Management (AMB) (Signed)
Chronic Care Management Pharmacy Assistant   Name: April Larson  MRN: [id number] DOB: 1951/03/06  Reason for Encounter: Medication Review/ Coordination of Enhanced Pharmacy Services/ Medication Cost Analysis/ Patient Assistance Coordination.  01/16/2021- Patient would like to onboard with [facility]. Medication cost analysis performed, patient would get the same benefits with [facility] from [facility]. Patient just received a 90 day supply of most of her medications. Patient will need the following medications per date:  Livalo 4 mg- 1 tablet daily (Bedtime) on 02/19/2021 Xultophy 100-3.6 mg/54ml pen- Inject 25 units daily on 03/23/2021- Patient stated medication will no longer be covered with insurance next year. Working on patient assistance for patient.  Temisartan/HCTZ 40-12.5 mg- 1 tablet daily (Breakfast) on 03/23/2021 Carbidopa-Levodopa 25-100 mg- 1 tablet three times daily( Before breakfast, Lunch and Evening meal) on 03/24/2021 (Prescribed by Dr [name]- Neurology) Fluticasone NS- 2 sprays each nostril daily on 03/24/2021 Metformin 500 mg- 1 tablet daily (Bedtime)  on 04/15/2021 Pioglitazone 15 mg- 1 tablet daily (Bedtime) on 04/15/2021 Omeprazole 40 mg- 1 tablet daily( breakfast)  on 04/16/2021  01/26/2021- Called patent to review medications listed above, no answer left message to return call.   01/29/2021- Spoke with patient, we reviewed all medications and vitamins, patient agrees to list above of when she will need medications. Patient would like to use the Adherence packaging system. Patient would also like to get her Vitamins in her packs, those are scheduled to be delivered 02/19/2021:  One-a Day Womens Multivitamin- 1 tablet daily (Breakfast) Vitamin D3 5, 000 units- 1 tablet daily (Bedtime) Tumeric 1000 mg- 1 tablet daily (Breakfast) Asa 81 mg- 1 tablet daily (Breakfast) Loratadine 10 mg- 1 tablet  daily (Breakfast) Vitamin C 1000 mg- 1 tablet daily (Breakfast) Omega 3 Fish Oil- 1 tablet daily (Bedtime) Biotin 10 mg- 1 tablet daily (Breakfast) Calm gummies- 2 at night (Bedtime)    Mailed patient assistance application for Xultophy from [employer] Patient Assistance Program. Patient aware to fill out all highlighted areas and return forms to PCP office with income documentation. PCP given form to sign. Once patient returns her portion, will fax.   Medications: Outpatient Encounter Medications as of 01/16/2021  Medication Sig Note   ACCU-CHEK FASTCLIX LANCETS MISC USE TO CHECK BLOOD SUGARS TWICE DAILY    ACCU-CHEK GUIDE test strip USE TO CHECK BLOOD SUGARS TWICE DAILY AS DIRECTED    Ascorbic Acid (VITAMIN C PO) Take 1 tablet by mouth daily.    aspirin EC 81 MG tablet Take 81 mg by mouth daily.    B-D ULTRAFINE III SHORT PEN 31G X 8 MM MISC USE AS DIRECTED TWICE DAILY    Biotin 10 MG CAPS Take 1 tablet by mouth daily.    Blood Glucose Monitoring Suppl (ACCU-CHEK GUIDE ME) w/Device KIT USE TO CHECK BLOOD GLUCOSE TWICE DAILY    calcium carbonate (OSCAL) 1500 (600 Ca) MG TABS tablet Take 600 mg of elemental calcium by mouth daily with breakfast.    carbidopa-levodopa (SINEMET IR) 25-100 MG tablet Take 1 tablet by mouth 3 (three) times daily. Follow titration instructions provided separately in writing.    Cholecalciferol 125 MCG (5000 UT) TABS Take 5,000 Units by mouth daily.     diclofenac Sodium (VOLTAREN) 1 % GEL APPLY 2 GRAMS TOPICALLY TO THE AFFECTED AREA FOUR TIMES DAILY    fluticasone (FLONASE) 50 MCG/ACT nasal spray SHAKE LIQUID AND USE 2 SPRAYS IN EACH NOSTRIL DAILY    LIVALO 4  MG TABS TAKE 1 TABLET BY MOUTH DAILY    loratadine (CLARITIN) 10 MG tablet Take 10 mg by mouth daily. AM    metFORMIN (GLUCOPHAGE) 500 MG tablet Take 1 tablet (500 mg total) by mouth daily with breakfast. Dinner (Patient taking differently: Take 500 mg by mouth daily with breakfast.)    Multiple  Vitamins-Minerals (ONE-A-DAY WOMENS 50+ ADVANTAGE PO) Take 1 tablet by mouth daily.    NON FORMULARY 2 each. Calm 08/13/2020: Taking 2 at night before bed.    Omega-3 Fatty Acids (FISH OIL PO) Take by mouth.    omeprazole (PRILOSEC) 40 MG capsule TAKE 1 CAPSULE BY MOUTH EVERY DAY BEFORE A MEAL    pioglitazone (ACTOS) 15 MG tablet TAKE 1 TABLET BY MOUTH DAILY    telmisartan-hydrochlorothiazide (MICARDIS HCT) 40-12.5 MG tablet TAKE 1 TABLET BY MOUTH DAILY    Turmeric 500 MG CAPS Take 1 capsule by mouth 2 (two) times daily.    XULTOPHY 100-3.6 UNIT-MG/ML SOPN INJECT 25 UNITS SUBCUTANEOUSLY DAILY    No facility-administered encounter medications on file as of 01/16/2021.    [name], [place] Pharmacist Assistant [phone]

## 2021-01-20 ENCOUNTER — Ambulatory Visit: Payer: Medicare Other

## 2021-01-20 ENCOUNTER — Other Ambulatory Visit: Payer: Self-pay

## 2021-01-20 ENCOUNTER — Encounter: Payer: Self-pay | Admitting: Physical Therapy

## 2021-01-20 ENCOUNTER — Ambulatory Visit: Payer: Medicare Other | Admitting: Physical Therapy

## 2021-01-20 VITALS — BP 124/68 | HR 102 | Temp 98.7°F | Ht 61.0 in

## 2021-01-20 DIAGNOSIS — M25621 Stiffness of right elbow, not elsewhere classified: Secondary | ICD-10-CM | POA: Diagnosis not present

## 2021-01-20 DIAGNOSIS — R2681 Unsteadiness on feet: Secondary | ICD-10-CM

## 2021-01-20 DIAGNOSIS — R29818 Other symptoms and signs involving the nervous system: Secondary | ICD-10-CM | POA: Diagnosis not present

## 2021-01-20 DIAGNOSIS — R471 Dysarthria and anarthria: Secondary | ICD-10-CM | POA: Diagnosis not present

## 2021-01-20 DIAGNOSIS — R2689 Other abnormalities of gait and mobility: Secondary | ICD-10-CM | POA: Diagnosis not present

## 2021-01-20 DIAGNOSIS — R278 Other lack of coordination: Secondary | ICD-10-CM | POA: Diagnosis not present

## 2021-01-20 DIAGNOSIS — R293 Abnormal posture: Secondary | ICD-10-CM

## 2021-01-20 DIAGNOSIS — M25612 Stiffness of left shoulder, not elsewhere classified: Secondary | ICD-10-CM | POA: Diagnosis not present

## 2021-01-20 DIAGNOSIS — M25611 Stiffness of right shoulder, not elsewhere classified: Secondary | ICD-10-CM | POA: Diagnosis not present

## 2021-01-20 DIAGNOSIS — I129 Hypertensive chronic kidney disease with stage 1 through stage 4 chronic kidney disease, or unspecified chronic kidney disease: Secondary | ICD-10-CM

## 2021-01-20 DIAGNOSIS — M25622 Stiffness of left elbow, not elsewhere classified: Secondary | ICD-10-CM | POA: Diagnosis not present

## 2021-01-20 NOTE — Patient Instructions (Addendum)
Visit Information It was great speaking with you today!  Please let me know if you have any questions about our visit.   Goals Addressed             This Visit's Progress    Manage My Medicine       Timeframe:  Long-Range Goal Priority:  High Start Date:                             Expected End Date:                       Follow Up Date 02/19/2021  In Progress:   - call for medicine refill 2 or 3 days before it runs out - call if I am sick and can't take my medicine - keep a list of all the medicines I take; vitamins and herbals too - use an alarm clock or phone to remind me to take my medicine  -Patient to be onboarded Why is this important?   These steps will help you keep on track with your medicines.           Patient Care Plan: CCM Pharmacy Care Plan     Problem Identified: HTN, DM II   Priority: High     Long-Range Goal: Disease Management   Start Date: 01/13/2021  Recent Progress: On track  Priority: High  Note:   Current Barriers:  Unable to independently monitor therapeutic efficacy Does not adhere to prescribed medication regimen Does not maintain contact with provider office Does not contact provider office for questions/concerns  Pharmacist Clinical Goal(s):  Patient will achieve adherence to monitoring guidelines and medication adherence to achieve therapeutic efficacy through collaboration with PharmD and provider.   Interventions: 1:1 collaboration with Glendale Chard, MD regarding development and update of comprehensive plan of care as evidenced by provider attestation and co-signature Inter-disciplinary care team collaboration (see longitudinal plan of care) Comprehensive medication review performed; medication list updated in electronic medical record  Hypertension (BP goal <130/80) -Controlled -Current treatment: Telmisartan - Hydrochlorothiazide 40-12.5 mg tablet once per day  -Current home readings: 170/80, patient reports her BP  readings have been high   -I am going to consult with Dr. Baird Cancer to determine next steps for when the patient should be seen.  -Current dietary habits: she is not adding any salt to her food, she is using Mrs[employer] Dash to season her food  -Current exercise habits: she is still walking her dog  -Denies hypotensive/hypertensive symptoms -She is no longer using [employer] to season her food, I congratulated  her on no longer using high salt seasonings.  -Educated on BP goals and benefits of medications for prevention of heart attack, stroke and kidney damage; -Counseled to monitor BP at home at least once per day, document, and provide log at future appointments -Collaborated with PCP patient to be seen on tomorrow at 11:30 am by Dr. Baird Cancer. -Recommended to continue current medication  Diabetes (A1c goal <7%) -Not ideally controlled -Current medications: Metformin 500 mg taking 1 tablet by mouth daily  Pioglitazone 15 mg taking 1 tablet by mouth daily Xultophy 100-3.6 unit-mg/mL - inject 25 units subcutaneously at night Has been on it for 3-4 years  -Current home glucose readings fasting glucose: 112, 71   Patient reports that her readings have been in the 70's for the past couple of weeks  -Reports hypoglycemic/hyperglycemic symptoms  -She reports that she  feels disoriented when her readings are low  -Current meal patterns:  breakfast: Oatmeal and Boost  lunch: what she ate the night before, greens, macaroni cheese, and baked chicken  dinner: Baked chicken, with rice or macaroni and cheese, and vegetables snacks: grapes, cheese sticks drinks: mostly water, with diet [place] Dry ginger ale, or diet pepsi -Current exercise: she is walking her dog daily. -Educated on Prevention and management of hypoglycemic episodes; -Counseled to check feet daily and get yearly eye exams -Collaborated with PCP to change patients current diabetes regimen due to low readings. Will also recommend patient be  prescribed GVOKE pen.   Patient Goals/Self-Care Activities Patient will:  - take medications as prescribed  Follow Up Plan: The patient has been provided with contact information for the care management team and has been advised to call with any health related questions or concerns.         Patient agreed to services and verbal consent obtained.   The patient verbalized understanding of instructions, educational materials, and care plan provided today and agreed to receive a mailed copy of patient instructions, educational materials, and care plan.   Orlando Penner, PharmD Clinical Pharmacist [facility] [phone]

## 2021-01-20 NOTE — Progress Notes (Signed)
The patient is here today for a blood pressure check. Patient was instructed on the use of her home bp monitor, her blood pressure with the monitor was 138/70.  The patients blood pressure was normal when checked manually in the office. The patient was told to continue with her current medications.

## 2021-01-20 NOTE — Therapy (Signed)
[facility] [address] [place], [place], [address] Phone: [phone]   Fax:  [phone]  Physical Therapy Treatment  Patient Details  Name: April Larson MRN: [id number] Date of Birth: [birth year] Referring Provider (PT): [name], MD   Encounter Date: 01/20/2021   PT End of Session - 01/21/21 0833     Visit Number 2    Number of Visits 17    Date for PT Re-Evaluation 04/09/21    Authorization Type [employer] Medicare - needs 10th visit PN    PT Start Time 1705    PT Stop Time 1745    PT Time Calculation (min) 40 min    Equipment Utilized During Treatment Gait belt    Activity Tolerance Patient tolerated treatment well    Behavior During Therapy WFL for tasks assessed/performed             Past Medical History:  Diagnosis Date   Diabetes mellitus without complication ([place])    Hypertension    Substance abuse ([place])     Past Surgical History:  Procedure Laterality Date   EYE SURGERY      There were no vitals filed for this visit.   Subjective Assessment - 01/20/21 1707     Subjective Had a frustrating day. No falls. Missed last week's appt.    Patient is accompained by: Family member   Sister, [name]   Pertinent History PMH significant for hypertension, diabetes, reflux disease, allergies, sleep apnea and borderline obesity, hx of rib fx s/p fall    Limitations Walking;House hold activities    Diagnostic tests CT shows generalized atrophy and white matter disease , DaTSCAN results are suggestive of Parkinsonian syndromes.    Patient Stated Goals Wants to work on walking/balance.    Currently in Pain? No/denies                [facility] PT Assessment - 01/20/21 1708       Mini-BESTest   Sit To Stand Normal: Comes to stand without use of hands and stabilizes independently.    Rise to Toes Normal: Stable for 3 s with maximum height.    Stand on one leg (left) Moderate: < 20 s   2.1,  4.44   Stand on one leg (right) Moderate: < 20 s   4.06, 2.84   Stand on one leg - lowest score 1    Compensatory Stepping Correction - Forward Normal: Recovers independently with a single, large step (second realignement is allowed).    Compensatory Stepping Correction - Backward Moderate: More than one step is required to recover equilibrium    Compensatory Stepping Correction - Left Lateral Severe: Falls, or cannot step   does not step   Compensatory Stepping Correction - Right Lateral Severe:  Falls, or cannot step   does not step   Stepping Corredtion Lateral - lowest score 0    Stance - Feet together, eyes open, firm surface  Normal: 30s    Stance - Feet together, eyes closed, foam surface  Normal: 30s    Incline - Eyes Closed Normal: Stands independently 30s and aligns with gravity    Change in Gait Speed Normal: Significantly changes walkling speed without imbalance    Walk with head turns - Horizontal Normal: performs head turns with no change in gait speed and good balance    Walk with pivot turns Moderate:Turns with feet close SLOW (>4 steps) with good balance.    Step over obstacles Moderate:  Steps over box but touches box OR displays cautious behavior by slowing gait.    Timed UP & GO with Dual Task Moderate: Dual Task affects either counting OR walking (>10%) when compared to the TUG without Dual Task.    Mini-BEST total score 21                           [facility] Adult PT Treatment/Exercise - 01/20/21 1708       Transfers   Transfers Sit to Stand;Stand to Sit    Sit to Stand 5: Supervision    Stand to Sit 5: Supervision    Comments Without UE support from mat table x10 reps - working on tucking feet back, scooting towards edge incr forward lean to decr retropulsion      Ambulation/Gait   Ambulation/Gait Yes    Ambulation/Gait Assistance 5: Supervision    Ambulation/Gait Assistance Details Cues for posture, step length and reciprocal arm swing (esp with RUE)     Ambulation Distance (Feet) 230 Feet   x2   Assistive device None    Gait Pattern Step-through pattern;Decreased arm swing - right    Ambulation Surface Level;Indoor               01/20/21 0001  Balance Exercises: Standing  Stepping Strategy Posterior;UE support;Limitations  Stepping Strategy Limitations cues for weight shift       Access Code: [email or web address] URL: [email or web address] Date: 01/20/2021 Prepared by: Janann August  Initiated HEP  Exercises Standing Marching - 2 x daily - 5 x weekly - 4 sets - at countertop focused on slowed/controlled SLS  Side Stepping with Counter Support - 2 x daily - 5 x weekly - 1 sets - 10 reps - performed as lateral stepping strategy with initial cues for weight shift/foot clearance Sit to Stand - 2 x daily - 5 x weekly - 2 sets - 5 reps - with focus on incr forward weight shift and scooting towards edge to decr retropulsion    PT Education - 01/21/21 0832     Education Details Initial HEP, results of miniBEST, what OT will work on in comparison to [employer]) Educated Patient   pt's sister   Methods Explanation;Demonstration;Handout    Comprehension Verbalized understanding;Returned demonstration              PT Short Term Goals - 01/09/21 1112       PT SHORT TERM GOAL #1   Title Pt will be independent with initial HEP in order to build upon functional gains made in threrapy. ALL STGS DUE 02/06/21    Time 4    Period Weeks    Status New    Target Date 02/06/21      PT SHORT TERM GOAL #2   Title Pt will decr 5x sit <> stand with no UE support to 18.5 seconds or less in order to decr fall risk/improve functional BLE strength.    Baseline 21.50 seconds    Time 4    Period Weeks    Status New      PT SHORT TERM GOAL #3   Title Pt will decr manual TUG to 13 seconds or less in order to demo decr fall risk.    Baseline 14.75 seconds    Time 4    Period Weeks    Status New      PT SHORT TERM GOAL #4    Title Pt and pt's family  will verbalize understanding of fall prevention in the home.    Time 4    Period Weeks    Status New               PT Long Term Goals - 01/21/21 9381       PT LONG TERM GOAL #1   Title Pt will be independent with final HEP in order to build upon functional gains made in threrapy. ALL LTGS DUE 03/06/21    Time 8    Period Weeks    Status New      PT LONG TERM GOAL #2   Title Pt will decr 5x sit <> stand with no UE support to 16.5 seconds or less in order to decr fall risk/improve functional BLE strength.    Baseline 21.5 seconds    Time 8    Period Weeks    Status New      PT LONG TERM GOAL #3   Title Pt will verbalize understanding of local Parkinson's disease resources.    Time 8    Period Weeks    Status New      PT LONG TERM GOAL #4   Title Pt will improve miniBEST score to at least a 24/28 in order to demo decr fall risk.    Baseline 21/28    Time 8    Period Weeks    Status Revised      PT LONG TERM GOAL #5   Title Pt will recover posterior balance in push and release test in 1 step independently, for improved balance recovery    Baseline 2 small steps    Time 8    Period Weeks    Status New                   Plan - 01/21/21 0175     Clinical Impression Statement Performed the miniBEST today with pt scoring a 21/28, indicating that pt is at an incr fall risk, LTG written as appropriate. Initiated pt's HEP today with focus on SLS for balance, sit <> stands, and lateral stepping strategies. Pt needing cues for proper technique of sit <> stands in order to demo decr retropulsion. Pt improved with incr reps. Will continue to progress towards LTGs.    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 PMH significant for hypertension, diabetes, reflux disease, sleep apnea and borderline obesity, hx of  rib fx s/p fall.    Examination-Activity Limitations Transfers;Stairs;Locomotion Level    Examination-Participation Restrictions Community Activity;Driving;Occupation    Stability/Clinical Decision Making Evolving/Moderate complexity    Rehab Potential Good    PT Frequency 2x / week    PT Duration 12 weeks    PT Treatment/Interventions ADLs/Self Care Home Management;DME Instruction;Therapeutic activities;Functional mobility training;Stair training;Gait training;Therapeutic exercise;Balance training;Neuromuscular re-education;Patient/family education;Vestibular    PT Next Visit Plan review sit <> stands. Try standing PWR moves, lateral/posterior stepping strategies. Gait with arm swing.    Consulted and Agree with Plan of Care Patient;Family member/caregiver    Family Member Consulted pt's sister, [name]             Patient will benefit from skilled therapeutic intervention in order to improve the following deficits and impairments:  Abnormal gait, Decreased activity tolerance, Decreased coordination, Decreased strength, Impaired tone, Postural dysfunction, Difficulty walking, Decreased balance, Decreased safety awareness  Visit Diagnosis: Other abnormalities of gait and mobility  Unsteadiness on feet  Other symptoms  and signs involving the nervous system  Abnormal posture     Problem List Patient Active Problem List   Diagnosis Date Noted   Decreased dorsalis pedis pulse 11/17/2020   Tachycardia 08/11/2020   Cough 08/11/2020   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   Pure hypercholesterolemia 01/23/2020   Vitamin D deficiency 10/23/2019   Right hip pain 05/15/2018   Estrogen deficiency 05/15/2018   Class 1 obesity due to excess calories with serious comorbidity and body mass index (BMI) of 31.0 to 31.9 in adult 05/15/2018   Diabetes mellitus with stage 2 chronic kidney disease ([place]) 01/11/2018   Benign hypertension with chronic kidney disease, stage II 01/11/2018    Arliss Journey, PT, DPT  01/21/2021, 8:40 AM  [facility] [address] [place], [place], [address] Phone: [phone]   Fax:  [phone]  Name: April Larson MRN: [id number] Date of Birth: 05-08-1950

## 2021-01-20 NOTE — Patient Instructions (Signed)
Access Code: [email or web address] URL: [email or web address] Date: 01/20/2021 Prepared by: Janann August  Exercises Standing Marching - 2 x daily - 5 x weekly - 4 sets Side Stepping with Counter Support - 2 x daily - 5 x weekly - 1 sets - 10 reps Sit to Stand - 2 x daily - 5 x weekly - 2 sets - 5 reps

## 2021-01-21 ENCOUNTER — Ambulatory Visit
Admission: RE | Admit: 2021-01-21 | Discharge: 2021-01-21 | Disposition: A | Discharge status: Home / Self Care | Payer: Medicare Other | Source: Ambulatory Visit | Attending: Internal Medicine | Admitting: Internal Medicine

## 2021-01-21 DIAGNOSIS — Z1231 Encounter for screening mammogram for malignant neoplasm of breast: Secondary | ICD-10-CM

## 2021-01-22 ENCOUNTER — Other Ambulatory Visit: Payer: Self-pay

## 2021-01-22 ENCOUNTER — Ambulatory Visit: Payer: Medicare Other | Admitting: Occupational Therapy

## 2021-01-22 DIAGNOSIS — M25612 Stiffness of left shoulder, not elsewhere classified: Secondary | ICD-10-CM

## 2021-01-22 DIAGNOSIS — M25611 Stiffness of right shoulder, not elsewhere classified: Secondary | ICD-10-CM | POA: Diagnosis not present

## 2021-01-22 DIAGNOSIS — M25621 Stiffness of right elbow, not elsewhere classified: Secondary | ICD-10-CM

## 2021-01-22 DIAGNOSIS — R278 Other lack of coordination: Secondary | ICD-10-CM | POA: Diagnosis not present

## 2021-01-22 DIAGNOSIS — R2689 Other abnormalities of gait and mobility: Secondary | ICD-10-CM | POA: Diagnosis not present

## 2021-01-22 DIAGNOSIS — M25622 Stiffness of left elbow, not elsewhere classified: Secondary | ICD-10-CM | POA: Diagnosis not present

## 2021-01-22 DIAGNOSIS — R471 Dysarthria and anarthria: Secondary | ICD-10-CM | POA: Diagnosis not present

## 2021-01-22 DIAGNOSIS — R293 Abnormal posture: Secondary | ICD-10-CM | POA: Diagnosis not present

## 2021-01-22 DIAGNOSIS — R29818 Other symptoms and signs involving the nervous system: Secondary | ICD-10-CM

## 2021-01-22 DIAGNOSIS — R2681 Unsteadiness on feet: Secondary | ICD-10-CM | POA: Diagnosis not present

## 2021-01-22 NOTE — Patient Instructions (Signed)
Coordination Exercises  Perform the following exercises for 20 minutes 1 times per day. Perform with both hand(s). Perform using big movements.  Flipping Cards: Place deck of cards on the table. Flip cards over by opening your hand big to grasp and then turn your palm up big. Deal cards: Hold 1/2 or whole deck in your hand. Use thumb to push card off top of deck with one big push. Rotate ball with fingertips: Pick up with fingers/thumb and move as much as you can with each turn/movement (clockwise and counter-clockwise). Toss ball from one hand to the other: Toss big/high. Toss ball in the air and catch with the same hand: Toss big/high. Pick up coins and place in coin bank or container: Pick up with big, intentional movements. Do not drag coin to the edge. Pick up coins and stack one at a time: Pick up with big, intentional movements. Do not drag coin to the edge. (5-10 in a stack) Pick up 5-10 coins one at a time and hold in palm. Then, move coins from palm to fingertips one at time and place in coin bank/container. Practice writing: Slow down, write big, and focus on forming each letter. Perform "Flicks"/hand stretches (PWR! Hands): Close hands then flick out your fingers with focus on opening hands, pulling wrists back, and extending elbows like you are pushing.

## 2021-01-22 NOTE — Therapy (Signed)
[facility] [address] [place], [place], [address] Phone: [phone]   Fax:  [phone]  Occupational Therapy Treatment  Patient Details  Name: April Larson MRN: [id number] Date of Birth: [birth year] Referring Provider (OT): Dr. Rexene Alberts   Encounter Date: 01/22/2021   OT End of Session - 01/22/21 1539     Visit Number 2    Number of Visits 25    Date for OT Re-Evaluation 03/31/21    Authorization - Visit Number 2    Authorization - Number of Visits 10    Progress Note Due on Visit 10    OT Start Time 6433    OT Stop Time 1623    OT Time Calculation (min) 46 min             Past Medical History:  Diagnosis Date   Diabetes mellitus without complication ([place])    Hypertension    Substance abuse ([place])     Past Surgical History:  Procedure Laterality Date   EYE SURGERY      There were no vitals filed for this visit.   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 PMH significant for hypertension, diabetes, reflux disease, allergies, sleep apnea and borderline obesity, hx of rib fx s/p fall,    Patient Stated Goals maintain independence    Currently in Pain? No/denies                        treatment: Pt practiced handwriting strategies and use of coban wrapped pen. Pt performed continuous "l" Pt demonstrates improved letter size with practice and cueing. PWR! Hands basic 4, 5-10 reps each, min-mod v.c.         OT Treatment/Education - 01/22/21 1651     Education Details coordination HEP- see pt instructions, hanwriting strategies and handout, education regarding visual deficits and importance of head turns    Person(s) Educated Patient;Other (comment)   sister   Methods Explanation;Demonstration;Verbal  cues;Handout;Tactile cues    Comprehension Verbalized understanding;Returned demonstration;Verbal cues required              OT Short Term Goals - 01/06/21 1519       OT SHORT TERM GOAL #1   Title I with PD specific HEP    Time 4    Period Weeks    Status New    Target Date 02/03/21      OT SHORT TERM GOAL #2   Title Pt will verbalize understanding of adapted strategies to maximize safety and I with ADLs/ IADLs (ie: cutting food, fasten buttons, writing)    Time 4    Period Weeks    Status New      OT SHORT TERM GOAL #3   Title Pt will verbalize understanding of compensatory strategies  for visual deficits.    Time 4    Period Weeks    Status New      OT SHORT TERM GOAL #4   Title Pt will demonstrate improved fine motor coordination for ADLs as evidenced by decreasing 9 hole peg test score for RUE by 3 secs    Time 4    Period Weeks    Status New      OT SHORT TERM GOAL #5   Title assess PPT#4 don/  doff jacket and set goal prn    Time 4    Period Weeks    Status New      Additional Short Term Goals   Additional Short Term Goals Yes      OT SHORT TERM GOAL #6   Title Pt will demonstrate understanding of memory compensations and ways to keep thinking skills sharp    Time 4    Period Weeks    Status New               OT Long Term Goals - 01/06/21 1522       OT LONG TERM GOAL #1   Title Pt will write a short paragraph with 100% legibility and  minimal decrease in letter size    Time 12    Period Weeks    Status New    Target Date 03/31/21      OT LONG TERM GOAL #2   Title Pt will demonstrate ability to retrieve a lightweight object at 115 shoulder flexion and -15 elbow extension with RUE    Time 12    Period Weeks    Status New      OT LONG TERM GOAL #3   Title Pt will demonstrate ability to retrieve a lightweight object at 120 shoulder flexion and -15 elbow extension with LUE    Time 12    Period Weeks    Status New      OT LONG TERM GOAL  #4   Title Pt will demonstrate improved ease with feeding as evidenced by decreasing PPT#2 by 3 secs    Time 12    Period Weeks    Status New      OT LONG TERM GOAL #5   Title Pt will demonstrate improved ease with fastening buttons as evidenced by decreasing 3 button/ unbutton time to 70 secs or less    Baseline 83 secs    Time 12    Period Weeks    Status New      Long Term Additional Goals   Additional Long Term Goals Yes      OT LONG TERM GOAL #6   Title Pt will demonstrate improved bilateral UE functional use for ADLs as evidenced by increasing box/ blocks score by 4 blocks with right and left UE's    Baseline R 41, L 39    Time 12    Period Weeks    Status New                   Plan - 01/22/21 1653     Clinical Impression Statement Pt is progressing towards goals. She demonstrates understanding of inital HEP.    OT Occupational Profile and History Detailed Assessment- Review of Records and additional review of physical, cognitive, psychosocial history related to current functional performance    Occupational performance deficits (Please refer to evaluation for details): ADL's;IADL's;Leisure;Social Participation;Play;Work    Marketing executive / Function / Physical Skills ADL;Endurance;UE functional use;Balance;Vision;Flexibility;FMC;ROM;Gait;Coordination;GMC;Sensation;Decreased knowledge of precautions;Decreased knowledge of use of DME;IADL;Strength;Dexterity;Mobility;Tone    Cognitive Skills Memory;Problem Solve;Safety Awareness;Thought    Rehab Potential Good    Clinical Decision Making Limited treatment options, no task modification necessary    Comorbidities Affecting Occupational Performance: May have comorbidities impacting occupational performance    Modification or Assistance to Complete Evaluation  No modification of tasks or assist necessary to complete eval    OT Frequency 2x / week   plus eval, anticipate d/c following 8 weeks dependent  upon pt progress.    OT Duration 12 weeks    OT Treatment/Interventions Self-care/ADL training;Energy conservation;Visual/perceptual remediation/compensation;Patient/family education;DME and/or AE instruction;Gait Training;Passive range of motion;Balance training;Fluidtherapy;Cryotherapy;Electrical Stimulation;Functional Mobility Training;Splinting;Therapeutic activities;Manual Therapy;Therapeutic exercise;Moist Heat;Neuromuscular education;Cognitive remediation/compensation    Plan assess PPT#4 and set goal prn, continue to address visual deficits prn, PWR! moves    Consulted and Agree with Plan of Care Patient             Patient will benefit from skilled therapeutic intervention in order to improve the following deficits and impairments:   Body Structure / Function / Physical Skills: ADL, Endurance, UE functional use, Balance, Vision, Flexibility, FMC, ROM, Gait, Coordination, GMC, Sensation, Decreased knowledge of precautions, Decreased knowledge of use of DME, IADL, Strength, Dexterity, Mobility, Tone Cognitive Skills: Memory, Problem Solve, Safety Awareness, Thought     Visit Diagnosis: Other abnormalities of gait and mobility  Other symptoms and signs involving the nervous system  Abnormal posture  Other lack of coordination  Stiffness of right shoulder, not elsewhere classified  Stiffness of left shoulder, not elsewhere classified  Stiffness of right elbow, not elsewhere classified    Problem List Patient Active Problem List   Diagnosis Date Noted   Decreased dorsalis pedis pulse 11/17/2020   Tachycardia 08/11/2020   Cough 08/11/2020   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 [place], [place], [address] Phone: [phone]   Fax:  [phone]  Name: Sharanda Vilikia Watkins Larson MRN: [id number] Date of Birth: [birth year]

## 2021-01-23 ENCOUNTER — Ambulatory Visit: Payer: Medicare Other | Admitting: Physical Therapy

## 2021-01-23 ENCOUNTER — Encounter: Payer: Self-pay | Admitting: Physical Therapy

## 2021-01-23 DIAGNOSIS — R2689 Other abnormalities of gait and mobility: Secondary | ICD-10-CM | POA: Diagnosis not present

## 2021-01-23 DIAGNOSIS — R471 Dysarthria and anarthria: Secondary | ICD-10-CM | POA: Diagnosis not present

## 2021-01-23 DIAGNOSIS — M25622 Stiffness of left elbow, not elsewhere classified: Secondary | ICD-10-CM | POA: Diagnosis not present

## 2021-01-23 DIAGNOSIS — R29818 Other symptoms and signs involving the nervous system: Secondary | ICD-10-CM | POA: Diagnosis not present

## 2021-01-23 DIAGNOSIS — M25621 Stiffness of right elbow, not elsewhere classified: Secondary | ICD-10-CM | POA: Diagnosis not present

## 2021-01-23 DIAGNOSIS — R2681 Unsteadiness on feet: Secondary | ICD-10-CM | POA: Diagnosis not present

## 2021-01-23 DIAGNOSIS — M25612 Stiffness of left shoulder, not elsewhere classified: Secondary | ICD-10-CM | POA: Diagnosis not present

## 2021-01-23 DIAGNOSIS — R293 Abnormal posture: Secondary | ICD-10-CM

## 2021-01-23 DIAGNOSIS — R278 Other lack of coordination: Secondary | ICD-10-CM | POA: Diagnosis not present

## 2021-01-23 DIAGNOSIS — M25611 Stiffness of right shoulder, not elsewhere classified: Secondary | ICD-10-CM | POA: Diagnosis not present

## 2021-01-23 NOTE — Therapy (Addendum)
April Larson [address] [place], [place], [address] Phone: [phone]   Fax:  [phone]  Physical Therapy Treatment  Patient Details  Name: April Larson MRN: [id number] Date of Birth: December 17, 1950 Referring Provider (PT): [name], MD   Encounter Date: 01/23/2021   PT End of Session - 01/23/21 1537     Visit Number 3    Number of Visits 17    Date for PT Re-Evaluation 04/09/21    Authorization Type UHC Medicare - needs 10th visit PN    PT Start Time 1533    PT Stop Time 1615    PT Time Calculation (min) 42 min    Equipment Utilized During Treatment --    Activity Tolerance Patient tolerated treatment well    Behavior During Therapy WFL for tasks assessed/performed             Past Medical History:  Diagnosis Date   Diabetes mellitus without complication ([place])    Hypertension    Substance abuse ([place])     Past Surgical History:  Procedure Laterality Date   EYE SURGERY      There were no vitals filed for this visit.   Subjective Assessment - 01/23/21 1538     Subjective Feeling more frustated with her condition today. No falls. Did the exercises at home and reports that they went well.    Patient is accompained by: Family member   Sister, [name]   Pertinent History PMH significant for hypertension, diabetes, reflux disease, allergies, sleep apnea and borderline obesity, hx of rib fx s/p fall    Limitations Walking;House hold activities    Diagnostic tests CT shows generalized atrophy and white matter disease , DaTSCAN results are suggestive of Parkinsonian syndromes.    Patient Stated Goals Wants to work on walking/balance.    Currently in Pain? No/denies                                01/23/21 1558  Transfers  Transfers Sit to Stand;Stand to Sit  Sit to Stand 5: Supervision  Stand to Sit 5: Supervision  Comments With and without UE support from mat  table x10 reps - working on tucking feet back, scooting towards edge incr forward lean to decr retropulsion. Plus additional reps throughout session, pt needs intermittent cues to tuck her feet back under her, but does improve throughout session with incr reps  Therapeutic Activites   Therapeutic Activities Other Therapeutic Activities  Other Therapeutic Activities Pt reporting that at home that she has to walk down the 3 stairs holding her dog (dog isn't able to go down the steps) outside and then back inside. Pt has no other exit to let the dog out in her house. Brainstormed with pt and pt's sister, discussed that safest option will be likely to see if pt can get a ramp installed with a railing so pt's dog can walk out on their own. Pt's sister to look into this option       Pt performs PWR! Moves in  standing position:    PWR! Up for improved posture x10 reps   PWR! Rock for improved weighshifting x5 reps through hips, then x10 reps reaching with cues to look up to hands  PWR! Twist for improved trunk rotation x10 reps, cues to reset in the middle and to look at hands   PWR! Step for improved step initiation x10  reps with cues for incr foot clearance   Cues provided for technique and intensity. Pt takes incr time with visual scanning to look towards hands with PWR Rock/Twist    01/23/21 1558  Balance Exercises: Standing  Stepping Strategy Posterior;Lateral;UE support;10 reps  Stepping Strategy Limitations Each direction x10 reps each leg, cues for weight shift and foot clearance (reviewed lateral stepping from pt's HEP)       PT Education - 01/23/21 1620     Education Details See therapeutic activity section. Sit to stand training.    Person(s) Educated Patient   pt's sister   Methods Explanation;Demonstration    Comprehension Verbalized understanding;Returned demonstration              PT Short Term Goals - 01/09/21 1112       PT SHORT TERM GOAL #1   Title Pt will be  independent with initial HEP in order to build upon functional gains made in threrapy. ALL STGS DUE 02/06/21    Time 4    Period Weeks    Status New    Target Date 02/06/21      PT SHORT TERM GOAL #2   Title Pt will decr 5x sit <> stand with no UE support to 18.5 seconds or less in order to decr fall risk/improve functional BLE strength.    Baseline 21.50 seconds    Time 4    Period Weeks    Status New      PT SHORT TERM GOAL #3   Title Pt will decr manual TUG to 13 seconds or less in order to demo decr fall risk.    Baseline 14.75 seconds    Time 4    Period Weeks    Status New      PT SHORT TERM GOAL #4   Title Pt and pt's family will verbalize understanding of fall prevention in the home.    Time 4    Period Weeks    Status New               PT Long Term Goals - 01/21/21 3557       PT LONG TERM GOAL #1   Title Pt will be independent with final HEP in order to build upon functional gains made in threrapy. ALL LTGS DUE 03/06/21    Time 8    Period Weeks    Status New      PT LONG TERM GOAL #2   Title Pt will decr 5x sit <> stand with no UE support to 16.5 seconds or less in order to decr fall risk/improve functional BLE strength.    Baseline 21.5 seconds    Time 8    Period Weeks    Status New      PT LONG TERM GOAL #3   Title Pt will verbalize understanding of local Parkinson's disease resources.    Time 8    Period Weeks    Status New      PT LONG TERM GOAL #4   Title Pt will improve miniBEST score to at least a 24/28 in order to demo decr fall risk.    Baseline 21/28    Time 8    Period Weeks    Status Revised      PT LONG TERM GOAL #5   Title Pt will recover posterior balance in push and release test in 1 step independently, for improved balance recovery    Baseline 2 small steps    Time  8    Period Weeks    Status New                01/26/21 1922  Plan  Clinical Impression Statement Performed massed practice of sit <> stands today as  pt initially demonstrating incr retropulsion when performing. Reviewed technique with pt needing most reminder cues for proper foot placement. Pt with improvement throughout session and performed with UE support to none. Trialed standing PWR moves with pt performing well. 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 PMH significant for hypertension, diabetes, reflux disease, sleep apnea and borderline obesity, hx of rib fx s/p fall.  Examination-Activity Limitations Transfers;Stairs;Locomotion Level  Examination-Participation Restrictions Community Activity;Driving;Occupation  Pt will benefit from skilled therapeutic intervention in order to improve on the following deficits Abnormal gait;Decreased activity tolerance;Decreased coordination;Decreased strength;Impaired tone;Postural dysfunction;Difficulty walking;Decreased balance;Decreased safety awareness  Stability/Clinical Decision Making Evolving/Moderate complexity  Rehab Potential Good  PT Frequency 2x / week  PT Duration 12 weeks  PT Treatment/Interventions ADLs/Self Care Home Management;DME Instruction;Therapeutic activities;Functional mobility training;Stair training;Gait training;Therapeutic exercise;Balance training;Neuromuscular re-education;Patient/family education;Vestibular  PT Next Visit Plan continue review of sit <> stands to decr retropulsion. Try standing PWR moves and add to HEP when appropriate, lateral/posterior stepping strategies. Gait with arm swing. Stair training  Consulted and Agree with Plan of Care Patient;Family member/caregiver  Family Member Consulted pt's sister, [name]        Patient will benefit from skilled therapeutic intervention in order to improve the following deficits and impairments:     Visit Diagnosis: Other  abnormalities of gait and mobility  Other symptoms and signs involving the nervous system  Abnormal posture     Problem List Patient Active Problem List   Diagnosis Date Noted   Decreased dorsalis pedis pulse 11/17/2020   Tachycardia 08/11/2020   Cough 08/11/2020   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   Pure hypercholesterolemia 01/23/2020   Vitamin D deficiency 10/23/2019   Right hip pain 05/15/2018   Estrogen deficiency 05/15/2018   Class 1 obesity due to excess calories with serious comorbidity and body mass index (BMI) of 31.0 to 31.9 in adult 05/15/2018   Diabetes mellitus with stage 2 chronic kidney disease ([facility]) 01/11/2018   Benign hypertension with chronic kidney disease, stage II 01/11/2018    Arliss Journey, PT, DPT  01/23/2021, 4:21 PM  [facility] [address] [place], [place], [address] Phone: [phone]   Fax:  [phone]  Name: April Larson MRN: [id number] Date of Birth: [birth year]

## 2021-01-26 ENCOUNTER — Ambulatory Visit: Payer: Medicare Other | Admitting: Speech Pathology

## 2021-01-26 ENCOUNTER — Other Ambulatory Visit: Payer: Self-pay

## 2021-01-26 ENCOUNTER — Encounter: Payer: Self-pay | Admitting: Speech Pathology

## 2021-01-26 DIAGNOSIS — R29818 Other symptoms and signs involving the nervous system: Secondary | ICD-10-CM | POA: Diagnosis not present

## 2021-01-26 DIAGNOSIS — R41841 Cognitive communication deficit: Secondary | ICD-10-CM

## 2021-01-26 DIAGNOSIS — M25611 Stiffness of right shoulder, not elsewhere classified: Secondary | ICD-10-CM | POA: Diagnosis not present

## 2021-01-26 DIAGNOSIS — R2689 Other abnormalities of gait and mobility: Secondary | ICD-10-CM | POA: Diagnosis not present

## 2021-01-26 DIAGNOSIS — M25621 Stiffness of right elbow, not elsewhere classified: Secondary | ICD-10-CM | POA: Diagnosis not present

## 2021-01-26 DIAGNOSIS — R293 Abnormal posture: Secondary | ICD-10-CM | POA: Diagnosis not present

## 2021-01-26 DIAGNOSIS — M25612 Stiffness of left shoulder, not elsewhere classified: Secondary | ICD-10-CM | POA: Diagnosis not present

## 2021-01-26 DIAGNOSIS — M25622 Stiffness of left elbow, not elsewhere classified: Secondary | ICD-10-CM | POA: Diagnosis not present

## 2021-01-26 DIAGNOSIS — R278 Other lack of coordination: Secondary | ICD-10-CM | POA: Diagnosis not present

## 2021-01-26 DIAGNOSIS — R471 Dysarthria and anarthria: Secondary | ICD-10-CM | POA: Diagnosis not present

## 2021-01-26 DIAGNOSIS — R2681 Unsteadiness on feet: Secondary | ICD-10-CM | POA: Diagnosis not present

## 2021-01-26 NOTE — Therapy (Signed)
[facility] [address] [place], [place], [address] Phone: [phone]   Fax:  [phone]  Speech Language Pathology Treatment  Patient Details  Name: April Larson MRN: [id number] Date of Birth: 1950-08-31 Referring Provider (SLP): [name], MD   Encounter Date: 01/26/2021   End of Session - 01/26/21 1624     Visit Number 3    Number of Visits 25    Date for SLP Re-Evaluation 04/03/21    Authorization Type UHC Medicare    SLP Start Time 1617    SLP Stop Time  1700    SLP Time Calculation (min) 43 min    Activity Tolerance Patient tolerated treatment well             Past Medical History:  Diagnosis Date   Diabetes mellitus without complication ([facility])    Hypertension    Substance abuse ([place])     Past Surgical History:  Procedure Laterality Date   EYE SURGERY      There were no vitals filed for this visit.   Subjective Assessment - 01/26/21 1624     Subjective "I tried to message you on mychart."    Currently in Pain? No/denies                   ADULT SLP TREATMENT - 01/26/21 1633       General Information   Behavior/Cognition Alert;Cooperative;Pleasant mood;Confused      Treatment Provided   Treatment provided Cognitive-Linquistic      Cognitive-Linquistic Treatment   Treatment focused on Cognition    Skilled Treatment Began training pt in attention skills this session (icommunicate). Pt reported, "all of these strategies could be helpful to me" when asked to choose 2 to try at home. SLP assisted pt in narrowing down strategies to two to trial at home (managing anxiety/depression and creating an "organized" grocery list). Pt reports some depression associated with diagnosis. SLP encouraged pt to reach out to counseling services and/or doctor if she feels these feelings are becoming unmanageable. For HEP, pt is to organize her grocery list by "area" in the store  (i.e. produce, meat, dairy).      Assessment / Recommendations / Plan   Plan Continue with current plan of care      Progression Toward Goals   Progression toward goals Progressing toward goals                SLP Short Term Goals - 01/26/21 1632       SLP SHORT TERM GOAL #1   Title Pt will utilize memory and attention compensations to optimize performance at work and home given occasional min A over 2 sessions    Time 4    Period Weeks    Status On-going    Target Date 02/06/21      SLP SHORT TERM GOAL #2   Title Pt will identify and correct errors on structured therapy tasks with 80% accuracy given occasional min A over 2 sessions    Time 4    Period Weeks    Status On-going    Target Date 02/06/21      SLP SHORT TERM GOAL #3   Title Pt will complete dysarthria HEP given occasional min A over 3 sessions    Time 4    Period Weeks    Status On-going    Target Date 02/06/21      SLP SHORT TERM GOAL #4   Title Pt  will sustain loud /a/ with average of low 90s dB over 3 sessions    Time 4    Period Weeks    Status On-going    Target Date 02/06/21      SLP SHORT TERM GOAL #5   Title Pt will maintain WNL conversational volume (70-72 dB) during 5 minute conversation given occasional min A over 2 sessions    Time 4    Period Weeks    Status On-going    Target Date 02/06/21              SLP Long Term Goals - 01/26/21 1632       SLP LONG TERM GOAL #1   Title Pt will utilize memory and attention compensations to optimize performance at work and home given rare min A over 2 sessions    Time 8    Period Weeks    Status On-going      SLP LONG TERM GOAL #2   Title Pt will identify and correct errors on structured therapy tasks with 90% accuracy given rare min A over 2 sessions    Time 8    Period Weeks    Status On-going      SLP LONG TERM GOAL #3   Title Pt will complete dysarthria HEP BID > 2 weeks    Time 12    Period Weeks    Status On-going       SLP LONG TERM GOAL #4   Title Pt will maintain WNL conversational volume (70-72 dB) during 15+ minute conversation given rare min A over 2 sessions    Time 12    Period Weeks    Status New              Plan - 01/26/21 1631     Clinical Impression Statement "April Larson" was referred for OPST to address dysarthria and 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 Pt was accompanied by her sister. Pt reports she has noticed decline in cognition over last year. Pt lives alone and is working full time as Environmental education officer. Pt reports she often has to "stop and restart" due to slower than baseline processing, particularly at work. Pt reports she is currently managing iADLs without difficulty. She also reported inconsistent vocal intensity, which ranges from low (per patient) and to high volume (per sister). Cognitive Linguistic Quick Test completed in upcoming session, with overall mild cognitive linguistic deficits indicated (mild attention and visuospatial & moderate for executive function). Possible confusion exhibited for appointment managment via MyChart indicated this session. Due to cognitive and speech decline impacting daily living tasks, skilled ST is warranted to optimize pt's functional independence, safety, and communication effectiveness.    Speech Therapy Frequency 2x / week    Duration 12 weeks    Treatment/Interventions Cognitive reorganization;Multimodal communcation approach;Language facilitation;Compensatory techniques;Internal/external aids;SLP instruction and feedback;Patient/family education;Functional tasks;Cueing hierarchy;Compensatory strategies    Potential to Achieve Goals Good    Consulted and Agree with Plan of Care Patient;Family member/caregiver             Patient will benefit from skilled therapeutic intervention in order to improve the following deficits and impairments:   Cognitive communication deficit    Problem  List Patient Active Problem List   Diagnosis Date Noted   Decreased dorsalis pedis pulse 11/17/2020   Tachycardia 08/11/2020   Cough 08/11/2020   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   Pure hypercholesterolemia 01/23/2020   Vitamin D deficiency 10/23/2019   Right  hip pain 05/15/2018   Estrogen deficiency 05/15/2018   Class 1 obesity due to excess calories with serious comorbidity and body mass index (BMI) of 31.0 to 31.9 in adult 05/15/2018   Diabetes mellitus with stage 2 chronic kidney disease ([facility]) 01/11/2018   Benign hypertension with chronic kidney disease, stage II 01/11/2018    Verdene Lennert MS, [place], CBIS  01/26/2021, 5:07 PM  [facility] [address] [place], [place], [address] Phone: [phone]   Fax:  [phone]   Name: April Larson MRN: [id number] Date of Birth: 1950/04/20

## 2021-01-28 ENCOUNTER — Other Ambulatory Visit: Payer: Self-pay

## 2021-01-28 ENCOUNTER — Ambulatory Visit: Payer: Medicare Other | Admitting: Occupational Therapy

## 2021-01-28 ENCOUNTER — Ambulatory Visit: Payer: Medicare Other | Admitting: Physical Therapy

## 2021-01-28 DIAGNOSIS — R471 Dysarthria and anarthria: Secondary | ICD-10-CM | POA: Diagnosis not present

## 2021-01-28 DIAGNOSIS — M25621 Stiffness of right elbow, not elsewhere classified: Secondary | ICD-10-CM

## 2021-01-28 DIAGNOSIS — R2689 Other abnormalities of gait and mobility: Secondary | ICD-10-CM | POA: Diagnosis not present

## 2021-01-28 DIAGNOSIS — M25611 Stiffness of right shoulder, not elsewhere classified: Secondary | ICD-10-CM | POA: Diagnosis not present

## 2021-01-28 DIAGNOSIS — R29818 Other symptoms and signs involving the nervous system: Secondary | ICD-10-CM

## 2021-01-28 DIAGNOSIS — M25622 Stiffness of left elbow, not elsewhere classified: Secondary | ICD-10-CM

## 2021-01-28 DIAGNOSIS — M25612 Stiffness of left shoulder, not elsewhere classified: Secondary | ICD-10-CM

## 2021-01-28 DIAGNOSIS — R2681 Unsteadiness on feet: Secondary | ICD-10-CM | POA: Diagnosis not present

## 2021-01-28 DIAGNOSIS — R278 Other lack of coordination: Secondary | ICD-10-CM

## 2021-01-28 DIAGNOSIS — R293 Abnormal posture: Secondary | ICD-10-CM | POA: Diagnosis not present

## 2021-01-28 NOTE — Therapy (Signed)
[facility] [address] [place], [place], [address] Phone: [phone]   Fax:  [phone]  Occupational Therapy Treatment  Patient Details  Name: April Larson MRN: [id number] Date of Birth: [birth year] Referring Provider (OT): Dr. Rexene Alberts   Encounter Date: 01/28/2021   OT End of Session - 01/28/21 1324     Visit Number 3    Number of Visits 25    Date for OT Re-Evaluation 03/31/21    Authorization - Visit Number 3    Authorization - Number of Visits 10    Progress Note Due on Visit 10    OT Start Time 1320    OT Stop Time 1400    OT Time Calculation (min) 40 min    Behavior During Therapy WFL for tasks assessed/performed             Past Medical History:  Diagnosis Date   Diabetes mellitus without complication ([place])    Hypertension    Substance abuse ([place])     Past Surgical History:  Procedure Laterality Date   EYE SURGERY      There were no vitals filed for this visit.  Pain: Pt denies pain today      Treatment: Arm bike x 5 mins level 1 for conditioning, min v.c for speed.                 OT Treatment/Education - 01/28/21 1400     Education Details PWR! moves basic 4 in supine, 10 reps each, min v.c and demonstration. ADL strategies and practice for donning pants over legs in seated, donning shirt simulated with bag and pulling up socks simulated crumpling bag min v.c and demonstration.    Person(s) Educated Patient    Methods Explanation;Demonstration;Verbal cues;Handout    Comprehension Verbalized understanding;Returned demonstration;Verbal cues required              OT Short Term Goals - 01/06/21 1519       OT SHORT TERM GOAL #1   Title I with PD specific HEP    Time 4    Period Weeks    Status New    Target Date 02/03/21      OT SHORT TERM GOAL #2   Title Pt will verbalize understanding of adapted strategies to maximize safety and I with  ADLs/ IADLs (ie: cutting food, fasten buttons, writing)    Time 4    Period Weeks    Status New      OT SHORT TERM GOAL #3   Title Pt will verbalize understanding of compensatory strategies  for visual deficits.    Time 4    Period Weeks    Status New      OT SHORT TERM GOAL #4   Title Pt will demonstrate improved fine motor coordination for ADLs as evidenced by decreasing 9 hole peg test score for RUE by 3 secs    Time 4    Period Weeks    Status New      OT SHORT TERM GOAL #5   Title assess PPT#4 don/ doff jacket and set goal prn    Time 4    Period Weeks    Status New      Additional Short Term Goals   Additional Short Term Goals Yes      OT SHORT TERM GOAL #6   Title Pt will demonstrate understanding of memory compensations and ways to keep thinking skills sharp    Time  4    Period Weeks    Status New               OT Long Term Goals - 01/06/21 1522       OT LONG TERM GOAL #1   Title Pt will write a short paragraph with 100% legibility and  minimal decrease in letter size    Time 12    Period Weeks    Status New    Target Date 03/31/21      OT LONG TERM GOAL #2   Title Pt will demonstrate ability to retrieve a lightweight object at 115 shoulder flexion and -15 elbow extension with RUE    Time 12    Period Weeks    Status New      OT LONG TERM GOAL #3   Title Pt will demonstrate ability to retrieve a lightweight object at 120 shoulder flexion and -15 elbow extension with LUE    Time 12    Period Weeks    Status New      OT LONG TERM GOAL #4   Title Pt will demonstrate improved ease with feeding as evidenced by decreasing PPT#2 by 3 secs    Time 12    Period Weeks    Status New      OT LONG TERM GOAL #5   Title Pt will demonstrate improved ease with fastening buttons as evidenced by decreasing 3 button/ unbutton time to 70 secs or less    Baseline 83 secs    Time 12    Period Weeks    Status New      Long Term Additional Goals   Additional  Long Term Goals Yes      OT LONG TERM GOAL #6   Title Pt will demonstrate improved bilateral UE functional use for ADLs as evidenced by increasing box/ blocks score by 4 blocks with right and left UE's    Baseline R 41, L 39    Time 12    Period Weeks    Status New                   Plan - 01/28/21 1326     Clinical Impression Statement Pt is progressing towards goals. She demonstrates understanding of PWR! moves,supine however she may benefit from reinforcement.    OT Occupational Profile and History Detailed Assessment- Review of Records and additional review of physical, cognitive, psychosocial history related to current functional performance    Occupational performance deficits (Please refer to evaluation for details): ADL's;IADL's;Leisure;Social Participation;Play;Work    Marketing executive / Function / Physical Skills ADL;Endurance;UE functional use;Balance;Vision;Flexibility;FMC;ROM;Gait;Coordination;GMC;Sensation;Decreased knowledge of precautions;Decreased knowledge of use of DME;IADL;Strength;Dexterity;Mobility;Tone    Cognitive Skills Memory;Problem Solve;Safety Awareness;Thought    Rehab Potential Good    Clinical Decision Making Limited treatment options, no task modification necessary    Comorbidities Affecting Occupational Performance: May have comorbidities impacting occupational performance    Modification or Assistance to Complete Evaluation  No modification of tasks or assist necessary to complete eval    OT Frequency 2x / week   plus eval, anticipate d/c following 8 weeks dependent upon pt progress.   OT Duration 12 weeks    OT Treatment/Interventions Self-care/ADL training;Energy conservation;Visual/perceptual remediation/compensation;Patient/family education;DME and/or AE instruction;Gait Training;Passive range of motion;Balance training;Fluidtherapy;Cryotherapy;Electrical Stimulation;Functional Mobility Training;Splinting;Therapeutic activities;Manual  Therapy;Therapeutic exercise;Moist Heat;Neuromuscular education;Cognitive remediation/compensation    Plan reinforce ADL strategies, issue PWR! moves seated or review PWR! supine    Consulted and Agree with Plan of Care  Patient             Patient will benefit from skilled therapeutic intervention in order to improve the following deficits and impairments:   Body Structure / Function / Physical Skills: ADL, Endurance, UE functional use, Balance, Vision, Flexibility, FMC, ROM, Gait, Coordination, GMC, Sensation, Decreased knowledge of precautions, Decreased knowledge of use of DME, IADL, Strength, Dexterity, Mobility, Tone Cognitive Skills: Memory, Problem Solve, Safety Awareness, Thought     Visit Diagnosis: Other symptoms and signs involving the nervous system  Abnormal posture  Other lack of coordination  Stiffness of right shoulder, not elsewhere classified  Stiffness of left shoulder, not elsewhere classified  Stiffness of right elbow, not elsewhere classified  Stiffness of left elbow, not elsewhere classified  Other abnormalities of gait and mobility    Problem List Patient Active Problem List   Diagnosis Date Noted   Decreased dorsalis pedis pulse 11/17/2020   Tachycardia 08/11/2020   Cough 08/11/2020   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   Pure hypercholesterolemia 01/23/2020   Vitamin D deficiency 10/23/2019   Right hip pain 05/15/2018   Estrogen deficiency 05/15/2018   Class 1 obesity due to excess calories with serious comorbidity and body mass index (BMI) of 31.0 to 31.9 in adult 05/15/2018   Diabetes mellitus with stage 2 chronic kidney disease ([place]) 01/11/2018   Benign hypertension with chronic kidney disease, stage II 01/11/2018    Shyane Fossum, OT/L 01/28/2021, 2:12 PM  [facility] [address] [place], [place], [address] Phone: [phone]   Fax:  [phone]  Name: Makela Vilikia  Watkins Larson MRN: [id number] Date of Birth: [birth year]

## 2021-01-30 ENCOUNTER — Other Ambulatory Visit: Payer: Self-pay

## 2021-01-30 ENCOUNTER — Ambulatory Visit: Payer: Medicare Other | Admitting: Physical Therapy

## 2021-01-30 ENCOUNTER — Encounter: Payer: Self-pay | Admitting: Occupational Therapy

## 2021-01-30 ENCOUNTER — Ambulatory Visit: Payer: Medicare Other

## 2021-01-30 ENCOUNTER — Ambulatory Visit: Payer: Medicare Other | Admitting: Occupational Therapy

## 2021-01-30 ENCOUNTER — Encounter: Payer: Self-pay | Admitting: Physical Therapy

## 2021-01-30 DIAGNOSIS — R29818 Other symptoms and signs involving the nervous system: Secondary | ICD-10-CM

## 2021-01-30 DIAGNOSIS — R2681 Unsteadiness on feet: Secondary | ICD-10-CM

## 2021-01-30 DIAGNOSIS — M25612 Stiffness of left shoulder, not elsewhere classified: Secondary | ICD-10-CM | POA: Diagnosis not present

## 2021-01-30 DIAGNOSIS — M25621 Stiffness of right elbow, not elsewhere classified: Secondary | ICD-10-CM | POA: Diagnosis not present

## 2021-01-30 DIAGNOSIS — R2689 Other abnormalities of gait and mobility: Secondary | ICD-10-CM | POA: Diagnosis not present

## 2021-01-30 DIAGNOSIS — R278 Other lack of coordination: Secondary | ICD-10-CM | POA: Diagnosis not present

## 2021-01-30 DIAGNOSIS — M25611 Stiffness of right shoulder, not elsewhere classified: Secondary | ICD-10-CM

## 2021-01-30 DIAGNOSIS — R293 Abnormal posture: Secondary | ICD-10-CM

## 2021-01-30 DIAGNOSIS — R471 Dysarthria and anarthria: Secondary | ICD-10-CM | POA: Diagnosis not present

## 2021-01-30 DIAGNOSIS — R41841 Cognitive communication deficit: Secondary | ICD-10-CM

## 2021-01-30 DIAGNOSIS — M25622 Stiffness of left elbow, not elsewhere classified: Secondary | ICD-10-CM | POA: Diagnosis not present

## 2021-01-30 NOTE — Therapy (Addendum)
[facility] [address] [place], [place], [address] Phone: [phone]   Fax:  [phone]  Physical Therapy Treatment  Patient Details  Name: April Larson MRN: [id number] Date of Birth: 06-23-1950 Referring Provider (PT): [name], MD   Encounter Date: 01/30/2021   PT End of Session - 01/30/21 1014     Visit Number 4    Number of Visits 17    Date for PT Re-Evaluation 04/09/21    Authorization Type [employer] - needs 10th visit PN    PT Start Time 0932    PT Stop Time 1013    PT Time Calculation (min) 41 min    Activity Tolerance Patient tolerated treatment well    Behavior During Therapy WFL for tasks assessed/performed             Past Medical History:  Diagnosis Date   Diabetes mellitus without complication ([facility])    Hypertension    Substance abuse ([facility])     Past Surgical History:  Procedure Laterality Date   EYE SURGERY      There were no vitals filed for this visit.   Subjective Assessment - 01/30/21 0933     Subjective Slid off the bed when trying to put on her socks the other day. Had OT since then who educated to sit on her commode with the lid down for a sturdier surface  - pt reporting this is going well.    Patient is accompained by: Family member   Sister, [name]   Pertinent History PMH significant for hypertension, diabetes, reflux disease, allergies, sleep apnea and borderline obesity, hx of rib fx s/p fall    Limitations Walking;House hold activities    Diagnostic tests CT shows generalized atrophy and white matter disease , DaTSCAN results are suggestive of Parkinsonian syndromes.    Patient Stated Goals Wants to work on walking/balance.    Currently in Pain? No/denies                               [facility] Adult PT Treatment/Exercise - 01/30/21 1002       Transfers   Transfers Sit to Stand;Stand to Sit    Sit to Stand 5: Supervision     Stand to Sit 5: Supervision    Comments Intermittent throughout session working on proper technique - esp proper foot placement and incr forward lean      Ambulation/Gait   Ambulation/Gait Yes    Ambulation/Gait Assistance 5: Supervision    Ambulation/Gait Assistance Details First 2 laps with walking poles with therapist helping to facilitate arm swing, last 2 laps without walking sticks - with cues for incr effort with walking with picking up RLE and arm swing    Ambulation Distance (Feet) 460 Feet   x1   Assistive device None    Gait Pattern Step-through pattern;Decreased arm swing - right    Ambulation Surface Level;Indoor    Stairs Yes    Stairs Assistance 5: Supervision    Stairs Assistance Details (indicate cue type and reason) Pt reports that her vision is blurry when she looks down and does not look down when going down the stairs. Pt has seen her optometrist and it is likely due to the Parkinsonism. Discussed for safety going down steps to make sure that she does 2 feet to a step instead of just one    Stair Management Technique One rail Right;Step  to pattern;Forwards;Alternating pattern    Number of Stairs 4   x2 reps   Height of Stairs 6               Pt performs PWR! Moves in  standing position:    PWR! Up for improved posture x10 reps - cues for R elbow extension    PWR! Rock for improved weighshifting x5 reps through hips, then x10 reps reaching with cues to look up to hands   PWR! Twist for improved trunk rotation x10 reps, cues to reset in the middle and to look at hands    PWR! Step for improved step initiation x10 reps with cues for incr foot clearance and to look at hands when reaching. Performed an additional x5 reps each side over yard stick for improved foot clearance.    Cues provided for technique and intensity. Pt takes incr time with visual scanning to look towards hands with PWR Rock/Twist. Pt reporting an intensity level as a 7-8/10 when performing.  Provided pt handout for home.   Discussed having a chair in front of her as needed for safety for balance.   Balance Exercises - 01/30/21 1002       Balance Exercises: Standing   SLS Eyes open;Limitations    SLS Limitations Alternating step taps to 6" step - x10 reps and then to 12" step x10 reps with each leg with cues for foot clearance.   Step Ups 6 inch;Limitations    Step Ups Limitations No UE support, x10 reps alternating legs                PT Education - 01/30/21 1015     Education Details Standing PWR moves for HEP.    Person(s) Educated Patient   pt's sister   Methods Explanation    Comprehension Verbalized understanding              PT Short Term Goals - 01/09/21 1112       PT SHORT TERM GOAL #1   Title Pt will be independent with initial HEP in order to build upon functional gains made in threrapy. ALL STGS DUE 02/06/21    Time 4    Period Weeks    Status New    Target Date 02/06/21      PT SHORT TERM GOAL #2   Title Pt will decr 5x sit <> stand with no UE support to 18.5 seconds or less in order to decr fall risk/improve functional BLE strength.    Baseline 21.50 seconds    Time 4    Period Weeks    Status New      PT SHORT TERM GOAL #3   Title Pt will decr manual TUG to 13 seconds or less in order to demo decr fall risk.    Baseline 14.75 seconds    Time 4    Period Weeks    Status New      PT SHORT TERM GOAL #4   Title Pt and pt's family will verbalize understanding of fall prevention in the home.    Time 4    Period Weeks    Status New               PT Long Term Goals - 01/21/21 3500       PT LONG TERM GOAL #1   Title Pt will be independent with final HEP in order to build upon functional gains made in threrapy. ALL LTGS DUE 03/06/21  Time 8    Period Weeks    Status New      PT LONG TERM GOAL #2   Title Pt will decr 5x sit <> stand with no UE support to 16.5 seconds or less in order to decr fall risk/improve functional  BLE strength.    Baseline 21.5 seconds    Time 8    Period Weeks    Status New      PT LONG TERM GOAL #3   Title Pt will verbalize understanding of local Parkinson's disease resources.    Time 8    Period Weeks    Status New      PT LONG TERM GOAL #4   Title Pt will improve miniBEST score to at least a 24/28 in order to demo decr fall risk.    Baseline 21/28    Time 8    Period Weeks    Status Revised      PT LONG TERM GOAL #5   Title Pt will recover posterior balance in push and release test in 1 step independently, for improved balance recovery    Baseline 2 small steps    Time 8    Period Weeks    Status New                   Plan - 01/30/21 1203     Clinical Impression Statement Pt with improvement in sit <> stands today with no episodes of retropulsion when performing. Performed standing PWR again today with pt performing well. Did need cues for R elbow extension and to look at hands with head/eyes. Pt reporting intensity level of a 7-8/10 when performing. Provided handout for HEP.    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 PMH significant for hypertension, diabetes, reflux disease, sleep apnea and borderline obesity, hx of rib fx s/p fall.    Examination-Activity Limitations Transfers;Stairs;Locomotion Level    Examination-Participation Restrictions Community Activity;Driving;Occupation    Stability/Clinical Decision Making Evolving/Moderate complexity    Rehab Potential Good    PT Frequency 2x / week    PT Duration 12 weeks    PT Treatment/Interventions ADLs/Self Care Home Management;DME Instruction;Therapeutic activities;Functional mobility training;Stair training;Gait training;Therapeutic exercise;Balance training;Neuromuscular re-education;Patient/family education;Vestibular    PT Next Visit Plan fall prevention education.  get pt on official walking program. Try [employer]. continue review of sit <> stands to decr retropulsion. review standing PWR, lateral/posterior stepping strategies. Gait with arm swing. Stair training    Consulted and Agree with Plan of Care Patient;Family member/caregiver    Family Member Consulted pt's sister, [name]             Patient will benefit from skilled therapeutic intervention in order to improve the following deficits and impairments:  Abnormal gait, Decreased activity tolerance, Decreased coordination, Decreased strength, Impaired tone, Postural dysfunction, Difficulty walking, Decreased balance, Decreased safety awareness  Visit Diagnosis: Other symptoms and signs involving the nervous system  Other lack of coordination  Abnormal posture  Unsteadiness on feet     Problem List Patient Active Problem List   Diagnosis Date Noted   Decreased dorsalis pedis pulse 11/17/2020   Tachycardia 08/11/2020   Cough 08/11/2020   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   Pure hypercholesterolemia 01/23/2020   Vitamin D deficiency 10/23/2019   Right hip pain 05/15/2018   Estrogen deficiency 05/15/2018   Class 1 obesity due to excess calories with serious comorbidity and body mass index (  BMI) of 31.0 to 31.9 in adult 05/15/2018   Diabetes mellitus with stage 2 chronic kidney disease ([place]) 01/11/2018   Benign hypertension with chronic kidney disease, stage II 01/11/2018    Arliss Journey, PT, DPT  01/30/2021, 12:06 PM  [facility] [address] [place], [place], [address] Phone: [phone]   Fax:  [phone]  Name: Ramiah Vilikia Watkins Larson MRN: [id number] Date of Birth: 18-Jul-1950

## 2021-01-30 NOTE — Patient Instructions (Signed)
Bag Exercises:  Small trash bag or produce bag works best.  For all exercises, sit with big posture (sit up tall with head up) and use big movements. Perform the following exercises 1 times per day.  Hold end of bag in one hand. Stretch fingers out big to draw the entire bag into your palm. Repeat 3 times with each hand. Hold bag in one hand. Stretch both arms/hands out to the side as big as you can. Then, pass bag from one hand to the other BEHIND you. Stretch arms back out big after each pass. Repeat 10 times. Hold bag in both hands in front of you with hands/arms shoulder length apart. Move bag behind your head. Repeat 10 times. Sitting bend leg so that ankle is across your knee, 5 reps each leg Sitting pick up your leg and touch your ankle 5 reps with each leg

## 2021-01-30 NOTE — Therapy (Signed)
[facility] [address] [place], [place], [address] Phone: [phone]   Fax:  [phone]  Speech Language Pathology Treatment  Patient Details  Name: April Larson MRN: [id number] Date of Birth: Apr 12, 1950 Referring Provider (SLP): [name], MD   Encounter Date: 01/30/2021   End of Session - 01/30/21 0800     Visit Number 4    Number of Visits 25    Date for SLP Re-Evaluation 04/03/21    Authorization Type UHC Medicare    SLP Start Time 0801    SLP Stop Time  0845    SLP Time Calculation (min) 44 min    Activity Tolerance Patient tolerated treatment well             Past Medical History:  Diagnosis Date   Diabetes mellitus without complication ([place])    Hypertension    Substance abuse ([place])     Past Surgical History:  Procedure Laterality Date   EYE SURGERY      There were no vitals filed for this visit.   Subjective Assessment - 01/30/21 0801     Subjective "I've been doing Constant Therapy"   Currently in Pain? No/denies                   ADULT SLP TREATMENT - 01/30/21 0800       General Information   Behavior/Cognition Alert;Cooperative;Pleasant mood;Confused      Treatment Provided   Treatment provided Cognitive-Linquistic      Cognitive-Linquistic Treatment   Treatment focused on Cognition    Skilled Treatment Pt reports success using Constant Therapy as pt has been targeting memory, listening, and focusing. Upon analysis of app, pt has not completed any trials in last 5 days. SLP recommended working on app 30 mins for last 6 days. Pt recalled education provided by previous SLP with good accuracy, but pt did not complete HEP. SLP educated and instructed abdominal breathing and loud /a/ to target breath support and appropriate loudness, as pt reported voice "varies." Usual fading to occasional cues required to optimize abdominal breathing for loud /a/ and  sentence readings. Loud /a/ averaged upper 80s dB with usual cues for full breath. Pt able to carryover appropriate loudness to short sentences with usual cues to slow rate and increase effort.      Assessment / Recommendations / Plan   Plan Continue with current plan of care      Progression Toward Goals   Progression toward goals Progressing toward goals              SLP Education - 01/30/21 1207     Education Details dysarthria HEP, work on [employer] Therapy every day    [employer]) Educated Patient    Methods Explanation;Demonstration;Handout;Verbal cues    Comprehension Verbalized understanding;Returned demonstration;Need further instruction;Verbal cues required              SLP Short Term Goals - 01/30/21 0800       SLP SHORT TERM GOAL #1   Title Pt will utilize memory and attention compensations to optimize performance at work and home given occasional min A over 2 sessions    Time 4    Period Weeks    Status On-going    Target Date 02/06/21      SLP SHORT TERM GOAL #2   Title Pt will identify and correct errors on structured therapy tasks with 80% accuracy given occasional min A over 2 sessions  Time 4    Period Weeks    Status On-going    Target Date 02/06/21      SLP SHORT TERM GOAL #3   Title Pt will complete dysarthria HEP given occasional min A over 3 sessions    Time 4    Period Weeks    Status On-going    Target Date 02/06/21      SLP SHORT TERM GOAL #4   Title Pt will sustain loud /a/ with average of low 90s dB over 3 sessions    Time 4    Period Weeks    Status On-going    Target Date 02/06/21      SLP SHORT TERM GOAL #5   Title Pt will maintain WNL conversational volume (70-72 dB) during 5 minute conversation given occasional min A over 2 sessions    Time 4    Period Weeks    Status On-going    Target Date 02/06/21              SLP Long Term Goals - 01/30/21 0800       SLP LONG TERM GOAL #1   Title Pt will utilize memory and  attention compensations to optimize performance at work and home given rare min A over 2 sessions    Time 8    Period Weeks    Status On-going    Target Date 03/06/21      SLP LONG TERM GOAL #2   Title Pt will identify and correct errors on structured therapy tasks with 90% accuracy given rare min A over 2 sessions    Time 8    Period Weeks    Status On-going    Target Date 03/06/21      SLP LONG TERM GOAL #3   Title Pt will complete dysarthria HEP BID > 2 weeks    Time 12    Period Weeks    Status On-going    Target Date 04/03/21      SLP LONG TERM GOAL #4   Title Pt will maintain WNL conversational volume (70-72 dB) during 15+ minute conversation given rare min A over 2 sessions    Time 12    Period Weeks    Status New    Target Date 04/03/21              Plan - 01/30/21 0800     Clinical Impression Statement "Dawnita" was referred for OPST to address dysarthria and 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 Pt was accompanied by her sister. SLP educated patient on dysarthria HEP and compensations to optimize vocal intensity. Usual cues required to utilize abdominal breathing to optimize breath support and increase effort to maintain appropriate volume. SLP also provided HEP for cognition. Due to cognitive and speech decline impacting daily living tasks, skilled ST is warranted to optimize pt's functional independence, safety, and communication effectiveness.    Speech Therapy Frequency 2x / week    Duration 12 weeks    Treatment/Interventions Cognitive reorganization;Multimodal communcation approach;Language facilitation;Compensatory techniques;Internal/external aids;SLP instruction and feedback;Patient/family education;Functional tasks;Cueing hierarchy;Compensatory strategies    Potential to Achieve Goals Good    Consulted and Agree with Plan of Care Patient;Family member/caregiver             Patient will benefit from skilled  therapeutic intervention in order to improve the following deficits and impairments:   Dysarthria and anarthria  Cognitive communication deficit    Problem List Patient Active Problem  List   Diagnosis Date Noted   Decreased dorsalis pedis pulse 11/17/2020   Tachycardia 08/11/2020   Cough 08/11/2020   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   Pure hypercholesterolemia 01/23/2020   Vitamin D deficiency 10/23/2019   Right hip pain 05/15/2018   Estrogen deficiency 05/15/2018   Class 1 obesity due to excess calories with serious comorbidity and body mass index (BMI) of 31.0 to 31.9 in adult 05/15/2018   Diabetes mellitus with stage 2 chronic kidney disease ([place]) 01/11/2018   Benign hypertension with chronic kidney disease, stage II 01/11/2018    Alinda Deem, MA CCC-SLP 01/30/2021, 12:09 PM  [facility] [address] [place], [place], [address] Phone: [phone]   Fax:  [phone]   Name: Ayaana Vilikia Watkins Larson MRN: [id number] Date of Birth: Sep 22, 1950

## 2021-01-30 NOTE — Therapy (Signed)
[facility] [address] [place], [place], [address] Phone: [phone]   Fax:  [phone]  Occupational Therapy Treatment  Patient Details  Name: April Larson MRN: [id number] Date of Birth: 1950-08-10 Referring Provider (OT): Dr. Rexene Alberts   Encounter Date: 01/30/2021   OT End of Session - 01/30/21 0910     Visit Number 4    Number of Visits 25    Date for OT Re-Evaluation 03/31/21    Authorization - Visit Number 4    Authorization - Number of Visits 10    OT Start Time 0848    OT Stop Time 0930    OT Time Calculation (min) 42 min             Past Medical History:  Diagnosis Date   Diabetes mellitus without complication ([place])    Hypertension    Substance abuse ([place])     Past Surgical History:  Procedure Laterality Date   EYE SURGERY      There were no vitals filed for this visit.   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 PMH significant for hypertension, diabetes, reflux disease, allergies, sleep apnea and borderline obesity, hx of rib fx s/p fall,    Patient Stated Goals maintain independence    Currently in Pain? No/denies                  Treatment: Functional reach in seated to place and remove clothespins with left and right UE's on vertical antennae with trunk rotation, min v.c for posture, foot placement and amplitude. Arm bike x 5 mins level 1 for conditioning                OT Treatment/  Education - 01/30/21 0908     Education Details PWR! moves basic 4 in supine, 10 reps each, min v.c and demonstration.  Practice using PWR! moves for bed mobility  Exercises for ADL strategies and  simulated practice for donning pants over legs in seated, donning shirt simulated with bag and pulling up socks simulated crumpling bag min v.c and  demonstration. Issued handout for bag exercises/ stretches, see pt instructions. Pt's sister was present today and verbalized understanding   Person(s) Educated Patient    Methods Explanation;Demonstration;Verbal cues;Handout    Comprehension Verbalized understanding;Returned demonstration;Verbal cues required              OT Short Term Goals - 01/06/21 1519       OT SHORT TERM GOAL #1   Title I with PD specific HEP    Time 4    Period Weeks    Status New    Target Date 02/03/21      OT SHORT TERM GOAL #2   Title Pt will verbalize understanding of adapted strategies to maximize safety and I with ADLs/ IADLs (ie: cutting food, fasten buttons, writing)    Time 4    Period Weeks    Status New      OT SHORT TERM GOAL #3   Title Pt will verbalize understanding of compensatory strategies  for visual deficits.    Time 4    Period Weeks    Status New      OT SHORT TERM GOAL #4   Title Pt will demonstrate improved fine motor coordination for ADLs as evidenced by decreasing 9  hole peg test score for RUE by 3 secs    Time 4    Period Weeks    Status New      OT SHORT TERM GOAL #5   Title assess PPT#4 don/ doff jacket and set goal prn    Time 4    Period Weeks    Status New      Additional Short Term Goals   Additional Short Term Goals Yes      OT SHORT TERM GOAL #6   Title Pt will demonstrate understanding of memory compensations and ways to keep thinking skills sharp    Time 4    Period Weeks    Status New               OT Long Term Goals - 01/06/21 1522       OT LONG TERM GOAL #1   Title Pt will write a short paragraph with 100% legibility and  minimal decrease in letter size    Time 12    Period Weeks    Status New    Target Date 03/31/21      OT LONG TERM GOAL #2   Title Pt will demonstrate ability to retrieve a lightweight object at 115 shoulder flexion and -15 elbow extension with RUE    Time 12    Period Weeks    Status New      OT LONG TERM  GOAL #3   Title Pt will demonstrate ability to retrieve a lightweight object at 120 shoulder flexion and -15 elbow extension with LUE    Time 12    Period Weeks    Status New      OT LONG TERM GOAL #4   Title Pt will demonstrate improved ease with feeding as evidenced by decreasing PPT#2 by 3 secs    Time 12    Period Weeks    Status New      OT LONG TERM GOAL #5   Title Pt will demonstrate improved ease with fastening buttons as evidenced by decreasing 3 button/ unbutton time to 70 secs or less    Baseline 83 secs    Time 12    Period Weeks    Status New      Long Term Additional Goals   Additional Long Term Goals Yes      OT LONG TERM GOAL #6   Title Pt will demonstrate improved bilateral UE functional use for ADLs as evidenced by increasing box/ blocks score by 4 blocks with right and left UE's    Baseline R 41, L 39    Time 12    Period Weeks    Status New                    Patient will benefit from skilled therapeutic intervention in order to improve the following deficits and impairments:           Visit Diagnosis: Other symptoms and signs involving the nervous system  Abnormal posture  Other lack of coordination  Stiffness of right shoulder, not elsewhere classified  Stiffness of left shoulder, not elsewhere classified    Problem List Patient Active Problem List   Diagnosis Date Noted   Decreased dorsalis pedis pulse 11/17/2020   Tachycardia 08/11/2020   Cough 08/11/2020   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   Pure hypercholesterolemia 01/23/2020   Vitamin D deficiency 10/23/2019   Right hip pain 05/15/2018   Estrogen deficiency 05/15/2018   Class 1  obesity due to excess calories with serious comorbidity and body mass index (BMI) of 31.0 to 31.9 in adult 05/15/2018   Diabetes mellitus with stage 2 chronic kidney disease ([place]) 01/11/2018   Benign hypertension with chronic kidney disease, stage II 01/11/2018    Riata Ikeda,  OT/L 01/30/2021, 9:14 AM  [facility] [address] [place], [place], [address] Phone: [phone]   Fax:  [phone]  Name: April Larson MRN: [id number] Date of Birth: 01/05/1951

## 2021-01-30 NOTE — Patient Instructions (Addendum)
Loud "ahhhh" x5, twice a day. Take a deep belly breath before. Be loud! Read 5-10 sentences in a loud, strong voice twice a day.   ABDOMINAL BREATHING   Shoulders down - this is a cue to relax Place your hand on your abdomen - this helps you focus on easy abdominal breath support - the best and most relaxed way to breathe Breathe in through your nose and fill your belly with air, watching your hand move outward Breathe out through your mouth and watch your belly move in. An audible "sh"  may help   Think of your belly as a balloon, when you fill with air (inhale), the balloon gets bigger. As the air goes out (exhale), the balloon deflates.  If you are having difficulty coordinating this, lay on your back with a plastic cup on your belly and repeat the above steps, watching you belly move up with inhalation and down with exhalations  Practice breathing in and out in front of a mirror, watching your belly Breathe in for a count of 5 and breathe out for a count of 5  Now as you breathe out, get a picture of relaxing in your mind Feel the constant in-out of your breathing with your belly Picture the tension in your throat and chest evaporate like steam, melting away and FEEL it do so Picture your throat opening up so wide that a grapefruit or softball could fit through your throat.   Practice this throughout the day when you are not having symptoms. For example: in the car, when watching TV, before medications. Regular practice when you are feeling well is important.  Make it automatic and use it at the first sense of throat tightness to prevent or suppress the VCD. You may start with the inhale or exhale.

## 2021-02-02 ENCOUNTER — Ambulatory Visit: Payer: Medicare Other

## 2021-02-02 ENCOUNTER — Other Ambulatory Visit: Payer: Self-pay

## 2021-02-02 ENCOUNTER — Encounter: Payer: Self-pay | Admitting: Physical Therapy

## 2021-02-02 ENCOUNTER — Ambulatory Visit: Payer: Medicare Other | Admitting: Physical Therapy

## 2021-02-02 DIAGNOSIS — E1122 Type 2 diabetes mellitus with diabetic chronic kidney disease: Secondary | ICD-10-CM | POA: Diagnosis not present

## 2021-02-02 DIAGNOSIS — R2689 Other abnormalities of gait and mobility: Secondary | ICD-10-CM | POA: Diagnosis not present

## 2021-02-02 DIAGNOSIS — M25612 Stiffness of left shoulder, not elsewhere classified: Secondary | ICD-10-CM | POA: Diagnosis not present

## 2021-02-02 DIAGNOSIS — R2681 Unsteadiness on feet: Secondary | ICD-10-CM | POA: Diagnosis not present

## 2021-02-02 DIAGNOSIS — N182 Chronic kidney disease, stage 2 (mild): Secondary | ICD-10-CM

## 2021-02-02 DIAGNOSIS — R29818 Other symptoms and signs involving the nervous system: Secondary | ICD-10-CM | POA: Diagnosis not present

## 2021-02-02 DIAGNOSIS — R471 Dysarthria and anarthria: Secondary | ICD-10-CM | POA: Diagnosis not present

## 2021-02-02 DIAGNOSIS — M25621 Stiffness of right elbow, not elsewhere classified: Secondary | ICD-10-CM | POA: Diagnosis not present

## 2021-02-02 DIAGNOSIS — R293 Abnormal posture: Secondary | ICD-10-CM

## 2021-02-02 DIAGNOSIS — R41841 Cognitive communication deficit: Secondary | ICD-10-CM

## 2021-02-02 DIAGNOSIS — M25611 Stiffness of right shoulder, not elsewhere classified: Secondary | ICD-10-CM | POA: Diagnosis not present

## 2021-02-02 DIAGNOSIS — I129 Hypertensive chronic kidney disease with stage 1 through stage 4 chronic kidney disease, or unspecified chronic kidney disease: Secondary | ICD-10-CM | POA: Diagnosis not present

## 2021-02-02 DIAGNOSIS — M25622 Stiffness of left elbow, not elsewhere classified: Secondary | ICD-10-CM | POA: Diagnosis not present

## 2021-02-02 DIAGNOSIS — R278 Other lack of coordination: Secondary | ICD-10-CM | POA: Diagnosis not present

## 2021-02-02 MED ORDER — FISH OIL 1000 MG PO CAPS: ORAL_CAPSULE | ORAL | 1 refills | Status: AC

## 2021-02-02 MED ORDER — ONE-A-DAY WOMENS 50+ ADVANTAGE PO TABS: ORAL_TABLET | ORAL | 1 refills | Status: AC

## 2021-02-02 MED ORDER — ASPIRIN EC 81 MG PO TBEC: 81.0000 mg | DELAYED_RELEASE_TABLET | Freq: Every day | ORAL | 1 refills | Status: AC

## 2021-02-02 MED ORDER — VITAMIN C 500 MG PO TABS: 500.0000 mg | ORAL_TABLET | Freq: Every day | ORAL | 1 refills | Status: AC

## 2021-02-02 MED ORDER — BIOTIN 10 MG PO CAPS: 1.0000 | ORAL_CAPSULE | Freq: Every day | ORAL | 1 refills | Status: DC

## 2021-02-02 MED ORDER — CHOLECALCIFEROL 125 MCG (5000 UT) PO TABS: 5000.0000 [IU] | ORAL_TABLET | Freq: Every day | ORAL | 1 refills | Status: DC

## 2021-02-02 MED ORDER — TURMERIC 500 MG PO CAPS: 1.0000 | ORAL_CAPSULE | Freq: Two times a day (BID) | ORAL | 1 refills | Status: DC

## 2021-02-02 MED ORDER — CALCIUM CARBONATE 1500 (600 CA) MG PO TABS: 600.0000 mg | ORAL_TABLET | Freq: Every day | ORAL | 1 refills | Status: AC

## 2021-02-02 MED ORDER — LORATADINE 10 MG PO TABS: ORAL_TABLET | ORAL | 1 refills | Status: AC

## 2021-02-02 NOTE — Therapy (Signed)
[facility] [address] [place], [place], [address] Phone: [phone]   Fax:  [phone]  Physical Therapy Treatment  Patient Details  Name: April Larson MRN: [id number] Date of Birth: 1950-09-06 Referring Provider (PT): [name], MD   Encounter Date: 02/02/2021   PT End of Session - 02/02/21 1536     Visit Number 5    Number of Visits 17    Date for PT Re-Evaluation 04/09/21    Authorization Type UHC Medicare - needs 10th visit PN    PT Start Time 1533    Activity Tolerance Patient tolerated treatment well    Behavior During Therapy [facility] for tasks assessed/performed             Past Medical History:  Diagnosis Date   Diabetes mellitus without complication ([place])    Hypertension    Substance abuse ([place])     Past Surgical History:  Procedure Laterality Date   EYE SURGERY      There were no vitals filed for this visit.   Subjective Assessment - 02/02/21 1536     Subjective No falls. Have not yet had the chance to try the standing PWR moves at home.    Patient is accompained by: Family member   Sister, April Larson   Pertinent History PMH significant for hypertension, diabetes, reflux disease, allergies, sleep apnea and borderline obesity, hx of rib fx s/p fall    Limitations Walking;House hold activities    Diagnostic tests CT shows generalized atrophy and white matter disease , DaTSCAN results are suggestive of Parkinsonian syndromes.    Patient Stated Goals Wants to work on walking/balance.    Currently in Pain? No/denies                [facility] PT Assessment - 02/02/21 1601       Timed Up and Go Test   Manual TUG (seconds) 13.97                           [facility] Adult PT Treatment/Exercise - 02/02/21 1632       Ambulation/Gait   Ambulation/Gait Yes    Ambulation/Gait Assistance 5: Supervision    Ambulation/Gait Assistance Details Between activities, cued  for foot clearance, arm swing esp with RUE    Assistive device None    Gait Pattern Step-through pattern;Decreased arm swing - right    Ambulation Surface Level;Indoor      Therapeutic Activites    Therapeutic Activities Other Therapeutic Activities    Other Therapeutic Activities Provided education on fall prevention in the home and gave handout.      Exercises   Exercises Knee/Hip      Knee/Hip Exercises: Aerobic   Stepper SciFit at level 1.5 for 5 minutes for ROM, aerobic activity, reciprocal movement. Educated on importance of aerobic activity with PD. Pt keeping spm between 85-90. Initially rating intensity level as 5/10 and incr to 7/10. Pt thinks that she has something like this that she can use in her church gym.            Pt performs PWR! Moves in  standing position:    PWR! Up for improved posture 2x10 reps - cues for R elbow extension and for proper mini squat position.    PWR! Rock for improved weighshifting x10 reps reaching with cues to look up to hands   PWR! Twist for improved trunk rotation x10 reps, cues to reset  in the middle and to look at hands when twisting.    PWR! Step for improved step initiation x10 reps with cues for incr foot clearance and to look at hands when reaching.      Cues provided for technique and intensity. Reviewed from HEP as pt has not yet had the chance to try at home.      Balance Exercises - 02/02/21 1602       Balance Exercises: Standing   Stepping Strategy Lateral;Anterior    Stepping Strategy Limitations Beginning with alternating stepping over 2" beam (x5 reps each side)  and then 4" (x10 reps each side) for incr foot clearance/SLS time, performed in both directions    Retro Gait 3 reps;Limitations    Retro Gait Limitations Down and back 3 times at countertop, cues for step length and continuous pattern. 1st rep pt performing in 14 steps, when cued for bigger steps pt able to perform in 8                  PT Short  Term Goals - 02/02/21 1558       PT SHORT TERM GOAL #1   Title Pt will be independent with initial HEP in order to build upon functional gains made in threrapy. ALL STGS DUE 02/06/21    Time 4    Period Weeks    Status New    Target Date 02/06/21      PT SHORT TERM GOAL #2   Title Pt will decr 5x sit <> stand with no UE support to 18.5 seconds or less in order to decr fall risk/improve functional BLE strength.    Baseline 21.50 seconds    Time 4    Period Weeks    Status New      PT SHORT TERM GOAL #3   Title Pt will decr manual TUG to 13 seconds or less in order to demo decr fall risk.    Baseline 14.75 seconds; 13.97 seconds on 02/02/21    Time 4    Period Weeks    Status Not Met      PT SHORT TERM GOAL #4   Title Pt and pt's family will verbalize understanding of fall prevention in the home.    Baseline handout provided on 02/02/21    Time 4    Period Weeks    Status Achieved               PT Long Term Goals - 01/21/21 4709       PT LONG TERM GOAL #1   Title Pt will be independent with final HEP in order to build upon functional gains made in threrapy. ALL LTGS DUE 03/06/21    Time 8    Period Weeks    Status New      PT LONG TERM GOAL #2   Title Pt will decr 5x sit <> stand with no UE support to 16.5 seconds or less in order to decr fall risk/improve functional BLE strength.    Baseline 21.5 seconds    Time 8    Period Weeks    Status New      PT LONG TERM GOAL #3   Title Pt will verbalize understanding of local Parkinson's disease resources.    Time 8    Period Weeks    Status New      PT LONG TERM GOAL #4   Title Pt will improve miniBEST score to at least a 24/28 in order  to demo decr fall risk.    Baseline 21/28    Time 8    Period Weeks    Status Revised      PT LONG TERM GOAL #5   Title Pt will recover posterior balance in push and release test in 1 step independently, for improved balance recovery    Baseline 2 small steps    Time 8     Period Weeks    Status New                   Plan - 02/02/21 1634     Clinical Impression Statement Began to assess pt's STGs today. Provided handout today for fall prevention in the home. Pt did not meet STG #3 - improved manual TUG score to 13.97 seconds, but not quite to goal level (previously was 14.75 seconds). However, pt's time does indicate a decr risk in falls. Remainder of session focused on Scifit for aerobic activity, reviewing standing PWR moves for HEP, and stepping strategy/foot clearance activities. Will continue to progress towards LTGs.    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 PMH significant for hypertension, diabetes, reflux disease, sleep apnea and borderline obesity, hx of rib fx s/p fall.    Examination-Activity Limitations Transfers;Stairs;Locomotion Level    Examination-Participation Restrictions Community Activity;Driving;Occupation    Stability/Clinical Decision Making Evolving/Moderate complexity    Rehab Potential Good    PT Frequency 2x / week    PT Duration 12 weeks    PT Treatment/Interventions ADLs/Self Care Home Management;DME Instruction;Therapeutic activities;Functional mobility training;Stair training;Gait training;Therapeutic exercise;Balance training;Neuromuscular re-education;Patient/family education;Vestibular    PT Next Visit Plan SciFit for aerobic activity. Gait training with incr effort/ arm swing. continue review of sit <> stands to decr retropulsion. review standing PWR as needed, lateral/posterior stepping strategies. SLS tasks. Having pt scan with eyes/head during balance tasks. Start pt on walking program for home.    Consulted and Agree with Plan of Care Patient;Family member/caregiver    Family Member Consulted pt's sister, April Larson             Patient will benefit from skilled therapeutic  intervention in order to improve the following deficits and impairments:  Abnormal gait, Decreased activity tolerance, Decreased coordination, Decreased strength, Impaired tone, Postural dysfunction, Difficulty walking, Decreased balance, Decreased safety awareness  Visit Diagnosis: Other symptoms and signs involving the nervous system  Abnormal posture  Unsteadiness on feet  Other abnormalities of gait and mobility     Problem List Patient Active Problem List   Diagnosis Date Noted   Decreased dorsalis pedis pulse 11/17/2020   Tachycardia 08/11/2020   Cough 08/11/2020   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   Pure hypercholesterolemia 01/23/2020   Vitamin D deficiency 10/23/2019   Right hip pain 05/15/2018   Estrogen deficiency 05/15/2018   Class 1 obesity due to excess calories with serious comorbidity and body mass index (BMI) of 31.0 to 31.9 in adult 05/15/2018   Diabetes mellitus with stage 2 chronic kidney disease ([place]) 01/11/2018   Benign hypertension with chronic kidney disease, stage II 01/11/2018    Arliss Journey, PT, DPT  02/02/2021, 4:37 PM  [facility] [address] [place], [place], [address] Phone: [phone]   Fax:  [phone]  Name: April Larson MRN: [id number] Date of Birth: 01-27-1951

## 2021-02-02 NOTE — Patient Instructions (Signed)

## 2021-02-02 NOTE — Therapy (Signed)
[facility] [address] [place], [place], [address] Phone: [phone]   Fax:  [phone]  Speech Language Pathology Treatment  Patient Details  Name: April Larson MRN: [id number] Date of Birth: 03-Dec-1950 Referring Provider (SLP): [name], MD   Encounter Date: 02/02/2021   End of Session - 02/02/21 1618     Visit Number 5    Number of Visits 25    Date for SLP Re-Evaluation 04/03/21    Authorization Type [employer] Medicare    SLP Start Time 1617    SLP Stop Time  1700    SLP Time Calculation (min) 43 min    Activity Tolerance Patient tolerated treatment well             Past Medical History:  Diagnosis Date   Diabetes mellitus without complication ([place])    Hypertension    Substance abuse ([place])     Past Surgical History:  Procedure Laterality Date   EYE SURGERY      There were no vitals filed for this visit.   Subjective Assessment - 02/02/21 1617     Subjective "I was real tired this weekend"    Currently in Pain? No/denies                   ADULT SLP TREATMENT - 02/02/21 1617       General Information   Behavior/Cognition Alert;Cooperative;Pleasant mood;Requires cueing      Treatment Provided   Treatment provided Cognitive-Linquistic      Cognitive-Linquistic Treatment   Treatment focused on Cognition;Dysarthria    Skilled Treatment No HEP completed over weekend due to busy schedule. SLP reviewed dysarthria HEP, including abdominal breathing and rationale to initiate PD HEP. Loud /a/ completed x5 with average upper 80s dB today. Pt identified errors on previous cognitive structured task, as pt stated "I forgot to stick to my list." Pt used mental math calculation for budget, which was not consistent with calculated total. SLP emphasized double checking to identify any errors or mistakes. SLP targeted structured error awareness task, in which pt identified errors  x3 independently. With mod SLP prompting, pt able to increase identify additional errors given occasional min A. SLP cued double check, in which pt ID'd 1 additional error. With min to mod verbal prompting, pt ID'd 4 more errors.      Assessment / Recommendations / Plan   Plan Continue with current plan of care      Progression Toward Goals   Progression toward goals Progressing toward goals              SLP Education - 02/02/21 1640     Education Details HEP, error awareness, double check    Person(s) Educated Patient    Methods Explanation;Demonstration;Handout;Verbal cues    Comprehension Verbalized understanding;Returned demonstration;Need further instruction;Verbal cues required              SLP Short Term Goals - 02/02/21 1619       SLP SHORT TERM GOAL #1   Title Pt will utilize memory and attention compensations to optimize performance at work and home given occasional min A over 2 sessions    Time 4    Period Weeks    Status On-going    Target Date 02/06/21      SLP SHORT TERM GOAL #2   Title Pt will identify and correct errors on structured therapy tasks with 80% accuracy given occasional min A over 2 sessions  Time 4    Period Weeks    Status On-going    Target Date 02/06/21      SLP SHORT TERM GOAL #3   Title Pt will complete dysarthria HEP given occasional min A over 3 sessions    Time 4    Period Weeks    Status On-going    Target Date 02/06/21      SLP SHORT TERM GOAL #4   Title Pt will sustain loud /a/ with average of low 90s dB over 3 sessions    Time 4    Period Weeks    Status On-going    Target Date 02/06/21      SLP SHORT TERM GOAL #5   Title Pt will maintain WNL conversational volume (70-72 dB) during 5 minute conversation given occasional min A over 2 sessions    Time 4    Period Weeks    Status On-going    Target Date 02/06/21              SLP Long Term Goals - 02/02/21 1619       SLP LONG TERM GOAL #1   Title Pt will  utilize memory and attention compensations to optimize performance at work and home given rare min A over 2 sessions    Time 8    Period Weeks    Status On-going    Target Date 03/06/21      SLP LONG TERM GOAL #2   Title Pt will identify and correct errors on structured therapy tasks with 90% accuracy given rare min A over 2 sessions    Time 8    Period Weeks    Status On-going    Target Date 03/06/21      SLP LONG TERM GOAL #3   Title Pt will complete dysarthria HEP BID > 2 weeks    Time 12    Period Weeks    Status On-going    Target Date 04/03/21      SLP LONG TERM GOAL #4   Title Pt will maintain WNL conversational volume (70-72 dB) during 15+ minute conversation given rare min A over 2 sessions    Time 12    Period Weeks    Status On-going    Target Date 04/03/21              Plan - 02/02/21 1618     Clinical Impression Statement "April Larson" was referred for OPST to address dysarthria and 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 SLP conducted ongoing education and training of dysarthria HEP and compensations to optimize vocal intensity. SLP targeted recall, problem solving, and attention for structured tasks, with occasional min to mod A required to ID and correct errors. Due to cognitive and speech decline impacting daily living tasks, skilled ST is warranted to optimize pt's functional independence, safety, and communication effectiveness.    Speech Therapy Frequency 2x / week    Duration 12 weeks    Treatment/Interventions Cognitive reorganization;Multimodal communcation approach;Language facilitation;Compensatory techniques;Internal/external aids;SLP instruction and feedback;Patient/family education;Functional tasks;Cueing hierarchy;Compensatory strategies    Potential to Achieve Goals Good    Consulted and Agree with Plan of Care Patient             Patient will benefit from skilled therapeutic intervention in order to  improve the following deficits and impairments:   Cognitive communication deficit  Dysarthria and anarthria    Problem List Patient Active Problem List   Diagnosis Date Noted  Decreased dorsalis pedis pulse 11/17/2020   Tachycardia 08/11/2020   Cough 08/11/2020   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   Pure hypercholesterolemia 01/23/2020   Vitamin D deficiency 10/23/2019   Right hip pain 05/15/2018   Estrogen deficiency 05/15/2018   Class 1 obesity due to excess calories with serious comorbidity and body mass index (BMI) of 31.0 to 31.9 in adult 05/15/2018   Diabetes mellitus with stage 2 chronic kidney disease ([facility]) 01/11/2018   Benign hypertension with chronic kidney disease, stage II 01/11/2018    Alinda Deem, MA CCC-SLP 02/02/2021, 6:56 PM  [facility] [address] [place], [place], [address] Phone: [phone]   Fax:  [phone]   Name: April Larson MRN: [id number] Date of Birth: 11-12-1950

## 2021-02-05 ENCOUNTER — Other Ambulatory Visit: Payer: Self-pay

## 2021-02-05 ENCOUNTER — Ambulatory Visit: Payer: Medicare Other | Attending: Internal Medicine | Admitting: Occupational Therapy

## 2021-02-05 ENCOUNTER — Ambulatory Visit: Payer: Medicare Other

## 2021-02-05 DIAGNOSIS — M25621 Stiffness of right elbow, not elsewhere classified: Secondary | ICD-10-CM | POA: Diagnosis not present

## 2021-02-05 DIAGNOSIS — R41841 Cognitive communication deficit: Secondary | ICD-10-CM

## 2021-02-05 DIAGNOSIS — M6281 Muscle weakness (generalized): Secondary | ICD-10-CM | POA: Insufficient documentation

## 2021-02-05 DIAGNOSIS — R2681 Unsteadiness on feet: Secondary | ICD-10-CM | POA: Diagnosis not present

## 2021-02-05 DIAGNOSIS — R29818 Other symptoms and signs involving the nervous system: Secondary | ICD-10-CM

## 2021-02-05 DIAGNOSIS — M25622 Stiffness of left elbow, not elsewhere classified: Secondary | ICD-10-CM | POA: Diagnosis not present

## 2021-02-05 DIAGNOSIS — R293 Abnormal posture: Secondary | ICD-10-CM | POA: Diagnosis not present

## 2021-02-05 DIAGNOSIS — R490 Dysphonia: Secondary | ICD-10-CM

## 2021-02-05 DIAGNOSIS — R471 Dysarthria and anarthria: Secondary | ICD-10-CM

## 2021-02-05 DIAGNOSIS — R2689 Other abnormalities of gait and mobility: Secondary | ICD-10-CM

## 2021-02-05 DIAGNOSIS — M25611 Stiffness of right shoulder, not elsewhere classified: Secondary | ICD-10-CM | POA: Diagnosis not present

## 2021-02-05 DIAGNOSIS — R278 Other lack of coordination: Secondary | ICD-10-CM

## 2021-02-05 DIAGNOSIS — M25612 Stiffness of left shoulder, not elsewhere classified: Secondary | ICD-10-CM

## 2021-02-05 MED ORDER — CARBIDOPA-LEVODOPA 25-100 MG PO TABS: 1.0000 | ORAL_TABLET | Freq: Three times a day (TID) | ORAL | 2 refills | Status: DC

## 2021-02-05 MED ORDER — METFORMIN HCL 500 MG PO TABS: 500.0000 mg | ORAL_TABLET | Freq: Every day | ORAL | 1 refills | Status: DC

## 2021-02-05 MED ORDER — FLUTICASONE PROPIONATE 50 MCG/ACT NA SUSP: NASAL | 2 refills | Status: DC

## 2021-02-05 MED ORDER — PIOGLITAZONE HCL 15 MG PO TABS: 15.0000 mg | ORAL_TABLET | Freq: Every day | ORAL | 1 refills | Status: DC

## 2021-02-05 MED ORDER — TELMISARTAN-HCTZ 40-12.5 MG PO TABS: 1.0000 | ORAL_TABLET | Freq: Every day | ORAL | 1 refills | Status: DC

## 2021-02-05 MED ORDER — LIVALO 4 MG PO TABS: 1.0000 | ORAL_TABLET | Freq: Every day | ORAL | 1 refills | Status: DC

## 2021-02-05 NOTE — Therapy (Signed)
[facility] [address] [place], [place], [address] Phone: [phone]   Fax:  [phone]  Speech Language Pathology Treatment  Patient Details  Name: April Larson MRN: [id number] Date of Birth: 30-Aug-1950 Referring Provider (SLP): [name], MD   Encounter Date: 02/05/2021   End of Session - 02/05/21 1610     Visit Number 6    Number of Visits 25    Date for SLP Re-Evaluation 04/03/21    Authorization Type UHC Medicare    SLP Start Time 1615    SLP Stop Time  1700    SLP Time Calculation (min) 45 min    Activity Tolerance Patient tolerated treatment well             Past Medical History:  Diagnosis Date   Diabetes mellitus without complication ([facility])    Hypertension    Substance abuse ([place])     Past Surgical History:  Procedure Laterality Date   EYE SURGERY      There were no vitals filed for this visit.   Subjective Assessment - 02/05/21 1704     Subjective "I'm good"    Currently in Pain? No/denies                   ADULT SLP TREATMENT - 02/05/21 1609       General Information   Behavior/Cognition Alert;Cooperative;Pleasant mood;Requires cueing      Treatment Provided   Treatment provided Cognitive-Linquistic      Cognitive-Linquistic Treatment   Treatment focused on Cognition;Dysarthria    Skilled Treatment Pt has been completing dysarthria HEP for sustained phonation and sentence level reading. Loud /a/ completed with average of 88 dB for 5 trials. Difficulty reported for cognitive HEP targeting attention and problem solving for decoding sequences. SLP demonstrated how to optimize attention and reduce of error with strategic processing. Pt benefited from extra time to complete task. Pt reportedly managing medication and bills with systems in place. Pt is managing calendar of appointments.      Assessment / Recommendations / Plan   Plan Continue with  current plan of care      Progression Toward Goals   Progression toward goals Progressing toward goals              SLP Education - 02/05/21 1703     Education Details ways to aid attention and awareness    Person(s) Educated Patient    Methods Explanation;Demonstration;Handout;Verbal cues    Comprehension Verbalized understanding;Returned demonstration;Need further instruction              SLP Short Term Goals - 02/05/21 1610       SLP SHORT TERM GOAL #1   Title Pt will utilize memory and attention compensations to optimize performance at work and home given occasional min A over 2 sessions    Baseline 02-05-21    Time 4    Period Weeks    Status Partially Met    Target Date 02/06/21      SLP SHORT TERM GOAL #2   Title Pt will identify and correct errors on structured therapy tasks with 80% accuracy given occasional min A over 2 sessions    Time 4    Period Weeks    Status Not Met    Target Date 02/06/21      SLP SHORT TERM GOAL #3   Title Pt will complete dysarthria HEP given occasional min A over 3 sessions  Baseline 02-05-21    Time 4    Period Weeks    Status Partially Met    Target Date 02/06/21      SLP SHORT TERM GOAL #4   Title Pt will sustain loud /a/ with average of low 90s dB over 3 sessions    Time 4    Period Weeks    Status Not Met    Target Date 02/06/21      SLP SHORT TERM GOAL #5   Title Pt will maintain WNL conversational volume (70-72 dB) during 5 minute conversation given occasional min A over 2 sessions    Time 4    Period Weeks    Status Not Met    Target Date 02/06/21              SLP Long Term Goals - 02/05/21 1611       SLP LONG TERM GOAL #1   Title Pt will utilize memory and attention compensations to optimize performance at work and home given rare min A over 2 sessions    Time 8    Period Weeks    Status On-going    Target Date 03/06/21      SLP LONG TERM GOAL #2   Title Pt will identify and correct errors  on structured therapy tasks with 90% accuracy given rare min A over 2 sessions    Time 8    Period Weeks    Status On-going    Target Date 03/06/21      SLP LONG TERM GOAL #3   Title Pt will complete dysarthria HEP BID > 2 weeks    Time 12    Period Weeks    Status On-going    Target Date 04/03/21      SLP LONG TERM GOAL #4   Title Pt will maintain WNL conversational volume (70-72 dB) during 15+ minute conversation given rare min A over 2 sessions    Time 12    Period Weeks    Status On-going    Target Date 04/03/21              Plan - 02/05/21 1610     Clinical Impression Statement "April Larson" was referred for OPST to address dysarthria and change in cognition 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 SLP conducted ongoing education and training of dysarthria HEP and compensations to optimize vocal intensity. SLP targeted recall, problem solving, and attention for structured tasks, with occasional min to mod A required to ID and correct errors. Due to cognitive and speech decline impacting daily living tasks, skilled ST is warranted to optimize pt's functional independence, safety, and communication effectiveness.    Speech Therapy Frequency 2x / week    Duration 12 weeks    Treatment/Interventions Cognitive reorganization;Multimodal communcation approach;Language facilitation;Compensatory techniques;Internal/external aids;SLP instruction and feedback;Patient/family education;Functional tasks;Cueing hierarchy;Compensatory strategies    Potential to Achieve Goals Good    Consulted and Agree with Plan of Care Patient             Patient will benefit from skilled therapeutic intervention in order to improve the following deficits and impairments:   Cognitive communication deficit  Dysarthria and anarthria    Problem List Patient Active Problem List   Diagnosis Date Noted   Decreased dorsalis pedis pulse 11/17/2020   Tachycardia 08/11/2020   Cough  08/11/2020   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   Pure hypercholesterolemia 01/23/2020   Vitamin D deficiency 10/23/2019   Right hip pain 05/15/2018  Estrogen deficiency 05/15/2018   Class 1 obesity due to excess calories with serious comorbidity and body mass index (BMI) of 31.0 to 31.9 in adult 05/15/2018   Diabetes mellitus with stage 2 chronic kidney disease ([facility]) 01/11/2018   Benign hypertension with chronic kidney disease, stage II 01/11/2018    Alinda Deem, MA CCC-SLP 02/05/2021, 5:04 PM  [facility] [address] [place], [place], [address] Phone: [phone]   Fax:  [phone]   Name: April Larson MRN: [id number] Date of Birth: [birth year]

## 2021-02-05 NOTE — Therapy (Signed)
[facility] [address] [place], [place], [address] Phone: [phone]   Fax:  [phone]  Occupational Therapy Treatment  Patient Details  Name: April Larson MRN: [id number] Date of Birth: Apr 03, 1951 Referring Provider (OT): Dr. Rexene Alberts   Encounter Date: 02/05/2021   OT End of Session - 02/05/21 1452     Visit Number 5    Number of Visits 25    Date for OT Re-Evaluation 03/31/21    Authorization - Visit Number 5    Authorization - Number of Visits 10    Progress Note Due on Visit 10    OT Start Time 1450    OT Stop Time 1530    OT Time Calculation (min) 40 min    Behavior During Therapy [facility] for tasks assessed/performed             Past Medical History:  Diagnosis Date   Diabetes mellitus without complication ([facility])    Hypertension    Substance abuse ([facility])     Past Surgical History:  Procedure Laterality Date   EYE SURGERY      There were no vitals filed for this visit.   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 PMH significant for hypertension, diabetes, reflux disease, allergies, sleep apnea and borderline obesity, hx of rib fx s/p fall,    Patient Stated Goals maintain independence    Currently in Pain? No/denies                                  OT Treatment/Education - 02/05/21 1553     Education Details PWR!moves basic 4 seated 10 reps each, min v.c and demonstration. donning doffing jacket with adapted strategy, pt demonstrates good perfromance. Handwriting strategies and practice, min-mod v.c- handout issued. Pt prints legibily with good letter size.    Person(s) Educated Patient    Methods Explanation;Demonstration;Verbal cues;Handout    Comprehension Verbalized understanding;Returned demonstration;Verbal cues required               OT Short Term Goals - 02/05/21 1523       OT SHORT TERM GOAL #1   Title I with PD specific HEP    Status On-going      OT SHORT TERM GOAL #2   Title Pt will verbalize understanding of adapted strategies to maximize safety and I with ADLs/ IADLs (ie: cutting food, fasten buttons, writing)    Status On-going      OT SHORT TERM GOAL #3   Title Pt will verbalize understanding of compensatory strategies  for visual deficits.    Status On-going      OT SHORT TERM GOAL #4   Title Pt will demonstrate improved fine motor coordination for ADLs as evidenced by decreasing 9 hole peg test score for RUE by 3 secs    Status On-going      OT SHORT TERM GOAL #5   Title assess PPT#4 don/ doff jacket and set goal prn    Status Deferred   13.72, pt in structed in adapted techniques after demo     OT SHORT TERM GOAL #6   Title Pt will demonstrate understanding of memory compensations and ways to keep thinking skills sharp    Status On-going  OT Long Term Goals - 02/05/21 1516       OT LONG TERM GOAL #1   Title Pt will write a short paragraph with 100% legibility and  minimal decrease in letter size    Time 12    Period Weeks    Status On-going      OT LONG TERM GOAL #2   Title Pt will demonstrate ability to retrieve a lightweight object at 115 shoulder flexion and -15 elbow extension with RUE    Time 12    Period Weeks    Status On-going      OT LONG TERM GOAL #3   Title Pt will demonstrate ability to retrieve a lightweight object at 120 shoulder flexion and -15 elbow extension with LUE    Time 12    Period Weeks    Status On-going      OT LONG TERM GOAL #4   Title Pt will demonstrate improved ease with feeding as evidenced by decreasing PPT#2 by 3 secs    Time 12    Period Weeks    Status On-going      OT LONG TERM GOAL #5   Title Pt will demonstrate improved ease with fastening buttons as evidenced by decreasing 3 button/ unbutton time to 70 secs or  less    Baseline 83 secs    Time 12    Period Weeks    Status On-going      OT LONG TERM GOAL #6   Title Pt will demonstrate improved bilateral UE functional use for ADLs as evidenced by increasing box/ blocks score by 4 blocks with right and left UE's    Baseline R 41, L 39    Time 12    Period Weeks    Status On-going                   Plan - 02/05/21 1453     Clinical Impression Statement Pt is progressing towards goals. She demonstrates understanding of PWR! moves,supine however she may benefit from reinforcement.    OT Occupational Profile and History Detailed Assessment- Review of Records and additional review of physical, cognitive, psychosocial history related to current functional performance    Occupational performance deficits (Please refer to evaluation for details): ADL's;IADL's;Leisure;Social Participation;Play;Work    Marketing executive / Function / Physical Skills ADL;Endurance;UE functional use;Balance;Vision;Flexibility;FMC;ROM;Gait;Coordination;GMC;Sensation;Decreased knowledge of precautions;Decreased knowledge of use of DME;IADL;Strength;Dexterity;Mobility;Tone    Cognitive Skills Memory;Problem Solve;Safety Awareness;Thought    Rehab Potential Good    Clinical Decision Making Limited treatment options, no task modification necessary    Comorbidities Affecting Occupational Performance: May have comorbidities impacting occupational performance    Modification or Assistance to Complete Evaluation  No modification of tasks or assist necessary to complete eval    OT Frequency 2x / week   plus eval, anticipate d/c following 8 weeks dependent upon pt progress.   OT Duration 12 weeks    OT Treatment/Interventions Self-care/ADL training;Energy conservation;Visual/perceptual remediation/compensation;Patient/family education;DME and/or AE instruction;Gait Training;Passive range of motion;Balance training;Fluidtherapy;Cryotherapy;Electrical Stimulation;Functional Mobility  Training;Splinting;Therapeutic activities;Manual Therapy;Therapeutic exercise;Moist Heat;Neuromuscular education;Cognitive remediation/compensation    Plan reinforce ADL strategies, issue PWR! moves seated or review PWR! supine    Consulted and Agree with Plan of Care Patient             Patient will benefit from skilled therapeutic intervention in order to improve the following deficits and impairments:   Body Structure / Function / Physical Skills: ADL, Endurance, UE functional use, Balance, Vision, Flexibility, FMC,  ROM, Gait, Coordination, GMC, Sensation, Decreased knowledge of precautions, Decreased knowledge of use of DME, IADL, Strength, Dexterity, Mobility, Tone Cognitive Skills: Memory, Problem Solve, Safety Awareness, Thought     Visit Diagnosis: Other symptoms and signs involving the nervous system  Abnormal posture  Other lack of coordination  Stiffness of right elbow, not elsewhere classified  Stiffness of right shoulder, not elsewhere classified  Stiffness of left shoulder, not elsewhere classified  Stiffness of left elbow, not elsewhere classified    Problem List Patient Active Problem List   Diagnosis Date Noted   Decreased dorsalis pedis pulse 11/17/2020   Tachycardia 08/11/2020   Cough 08/11/2020   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   Pure hypercholesterolemia 01/23/2020   Vitamin D deficiency 10/23/2019   Right hip pain 05/15/2018   Estrogen deficiency 05/15/2018   Class 1 obesity due to excess calories with serious comorbidity and body mass index (BMI) of 31.0 to 31.9 in adult 05/15/2018   Diabetes mellitus with stage 2 chronic kidney disease ([facility] [place]) 01/11/2018   Benign hypertension with chronic kidney disease, stage II 01/11/2018    Lashica Hannay, OT/L 02/05/2021, 3:57 PM Theone Murdoch, OTR/L [phone] Phone: [phone] 3:58 PM 02/05/21  [facility] [address] [place], [place], [address] Phone: [phone]   Fax:  [phone]  Name: April Larson MRN: [id number] Date of Birth: 1951-02-14

## 2021-02-05 NOTE — Patient Instructions (Signed)
Access Code: [email or web address] URL: [email or web address] Date: 02/05/2021 Prepared by: Cherly Anderson  Exercises Standing Marching - 2 x daily - 5 x weekly - 4 sets Side Stepping with Counter Support - 2 x daily - 5 x weekly - 1 sets - 10 reps Sit to Stand - 2 x daily - 5 x weekly - 2 sets - 5 reps

## 2021-02-06 NOTE — Therapy (Signed)
[facility] [address] [place], [place], [address] Phone: [phone]   Fax:  [phone]  Physical Therapy Treatment  Patient Details  Name: April Larson MRN: [id number] Date of Birth: 1950-06-14 Referring Provider (PT): [name], MD   Encounter Date: 02/05/2021   PT End of Session - 02/05/21 1536     Visit Number 6    Number of Visits 17    Date for PT Re-Evaluation 04/09/21    Authorization Type [employer] - needs 10th visit PN    PT Start Time 1534    PT Stop Time 1612    PT Time Calculation (min) 38 min    Activity Tolerance Patient tolerated treatment well    Behavior During Therapy WFL for tasks assessed/performed             Past Medical History:  Diagnosis Date   Diabetes mellitus without complication ([place])    Hypertension    Substance abuse ([place])     Past Surgical History:  Procedure Laterality Date   EYE SURGERY      There were no vitals filed for this visit.   Subjective Assessment - 02/05/21 1536     Subjective Pt denies any falls. Has only kind of been able to try the standing PWR  moves.    Patient is accompained by: Family member   Sister, [name]   Pertinent History PMH significant for hypertension, diabetes, reflux disease, allergies, sleep apnea and borderline obesity, hx of rib fx s/p fall    Limitations Walking;House hold activities    Diagnostic tests CT shows generalized atrophy and white matter disease , DaTSCAN results are suggestive of Parkinsonian syndromes.    Patient Stated Goals Wants to work on walking/balance.    Currently in Pain? No/denies                               [facility] Adult PT Treatment/Exercise - 02/05/21 1536       Transfers   Transfers Sit to Stand;Stand to Sit    Sit to Stand 5: Supervision    Sit to Stand Details Verbal cues for technique    Sit to Stand Details (indicate cue type and reason) Pt was cued  to shift weight forward more to keep toes down.    Five time sit to stand comments  19.35 sec from chair without hands    Comments Sit to stand x 5 from edge of mat working on increasing anterior weight shift with keeping toes down.      Ambulation/Gait   Ambulation/Gait Yes    Ambulation/Gait Assistance 5: Supervision    Ambulation/Gait Assistance Details around in clinic between activities. Pt working on increasing arm swing    Assistive device None    Gait Pattern Step-through pattern    Ambulation Surface Level;Indoor      Neuro Re-ed    Neuro Re-ed Details  Reviewed HEP at counter as noted below and performed standing PWR exercises as well. At counter: performed large step to side then reaching up to touch counter with weight shift x 5 each side.      Knee/Hip Exercises: Aerobic   Stepper SciFit at level 2.0 for 6 minutes for ROM, aerobic activity, reciprocal movement. Educated on importance of aerobic activity with PD. Pt keeping spm >80. Pt reported RPE 7-8/10 at end.  Exercises Standing Marching - 2 x daily - 5 x weekly - 4 sets  -performed at counter walking along 8' x 4 with cues to really increase hip flexion especially on RLE. Hand over counter for safety as needed. Side Stepping with Counter Support - 2 x daily - 5 x weekly - 1 sets - 10 reps  -performed at counter 8' x 4 with cues for increased step and upright posture Sit to Stand - 2 x daily - 5 x weekly - 2 sets - 5 reps  Pt performs PWR! Moves in  standing position (as instructed from primary PWR certified PT):    PWR! Up for improved posture x10 reps - cues for R elbow extension and for proper mini squat position.    PWR! Rock for improved weighshifting x10 reps reaching with cues to look up to hands   PWR! Twist for improved trunk rotation x10 reps, cues to reset in the middle and to look at hands when twisting.    PWR! Step for improved step initiation x10 reps with cues for incr foot clearance  and to look at hands when reaching.      Cues provided for technique and intensity.        PT Education - 02/06/21 1053     Education Details Results of testing and HEP review. PT also discussed starting walking program with gradually building up time to 20-30 minutes/day. Discussed seeing what was comfortable to start with like 5 minutes 2-3x/day and adding on time. Advised to focus more on time than steps taken.    Person(s) Educated Patient;Other (comment)   sister   Methods Explanation;Demonstration    Comprehension Verbalized understanding;Returned demonstration              PT Short Term Goals - 02/05/21 1548       PT SHORT TERM GOAL #1   Title Pt will be independent with initial HEP in order to build upon functional gains made in threrapy. ALL STGS DUE 02/06/21    Baseline Pt still needs some cuing with HEP. Sister has been helping her but pt has not been performing all exercises. They are going to also start going to gym to walk on track.    Time 4    Period Weeks    Status Partially Met    Target Date 02/06/21      PT SHORT TERM GOAL #2   Title Pt will decr 5x sit <> stand with no UE support to 18.5 seconds or less in order to decr fall risk/improve functional BLE strength.    Baseline 21.50 seconds. 02/05/21 19.35 sec    Time 4    Period Weeks    Status Partially Met      PT SHORT TERM GOAL #3   Title Pt will decr manual TUG to 13 seconds or less in order to demo decr fall risk.    Baseline 14.75 seconds; 13.97 seconds on 02/02/21    Time 4    Period Weeks    Status Not Met      PT SHORT TERM GOAL #4   Title Pt and pt's family will verbalize understanding of fall prevention in the home.    Baseline handout provided on 02/02/21    Time 4    Period Weeks    Status Achieved               PT Long Term Goals - 01/21/21 7591       PT  LONG TERM GOAL #1   Title Pt will be independent with final HEP in order to build upon functional gains made in  threrapy. ALL LTGS DUE 03/06/21    Time 8    Period Weeks    Status New      PT LONG TERM GOAL #2   Title Pt will decr 5x sit <> stand with no UE support to 16.5 seconds or less in order to decr fall risk/improve functional BLE strength.    Baseline 21.5 seconds    Time 8    Period Weeks    Status New      PT LONG TERM GOAL #3   Title Pt will verbalize understanding of local Parkinson's disease resources.    Time 8    Period Weeks    Status New      PT LONG TERM GOAL #4   Title Pt will improve miniBEST score to at least a 24/28 in order to demo decr fall risk.    Baseline 21/28    Time 8    Period Weeks    Status Revised      PT LONG TERM GOAL #5   Title Pt will recover posterior balance in push and release test in 1 step independently, for improved balance recovery    Baseline 2 small steps    Time 8    Period Weeks    Status New                   Plan - 02/06/21 1056     Clinical Impression Statement PT checked remaining STGs. Pt has not been consistent in HEP performance and sister going to start helping her more with it. PT printed off counter exercises from before as well. Reviewed standing PWR which pt did need some cuing on. Has difficulty following arm with eyes. She decreased 5 x sit to stand some but short of goal. Pt will continue to benefit from skilled PT to further progress towards remaining goals.    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 PMH significant for hypertension, diabetes, reflux disease, sleep apnea and borderline obesity, hx of rib fx s/p fall.    Examination-Activity Limitations Transfers;Stairs;Locomotion Level    Examination-Participation Restrictions Community Activity;Driving;Occupation    Stability/Clinical Decision Making Evolving/Moderate complexity    Rehab Potential Good    PT Frequency 2x /  week    PT Duration 12 weeks    PT Treatment/Interventions ADLs/Self Care Home Management;DME Instruction;Therapeutic activities;Functional mobility training;Stair training;Gait training;Therapeutic exercise;Balance training;Neuromuscular re-education;Patient/family education;Vestibular    PT Next Visit Plan I started on walking program instruction to gradually build up to 20-30 minutes/day. Advised to start at about 5 min and increase from there perhaps spreading out times to get more throughout day. How is that going? SciFit for aerobic activity. Gait training with incr effort/ arm swing. continue review of sit <> stands to decr retropulsion. review standing PWR as needed, lateral/posterior stepping strategies. SLS tasks. Having pt scan with eyes/head during balance tasks.    Consulted and Agree with Plan of Care Patient;Family member/caregiver    Family Member Consulted pt's sister, [name]             Patient will benefit from skilled therapeutic intervention in order to improve the following deficits and impairments:  Abnormal gait, Decreased activity tolerance, Decreased coordination, Decreased strength, Impaired tone, Postural dysfunction, Difficulty walking, Decreased balance, Decreased safety awareness  Visit Diagnosis: Other abnormalities of gait and mobility  Muscle weakness (generalized)  Unsteadiness on feet     Problem List Patient Active Problem List   Diagnosis Date Noted   Decreased dorsalis pedis pulse 11/17/2020   Tachycardia 08/11/2020   Cough 08/11/2020   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   Pure hypercholesterolemia 01/23/2020   Vitamin D deficiency 10/23/2019   Right hip pain 05/15/2018   Estrogen deficiency 05/15/2018   Class 1 obesity due to excess calories with serious comorbidity and body mass index (BMI) of 31.0 to 31.9 in adult 05/15/2018   Diabetes mellitus with stage 2 chronic kidney disease ([place]) 01/11/2018   Benign hypertension with chronic kidney  disease, stage II 01/11/2018    Electa Sniff, PT, DPT, NCS 02/06/2021, 11:00 AM  [facility] [address] [place], [place], [address] Phone: [phone]   Fax:  [phone]  Name: April Larson MRN: [id number] Date of Birth: 03-Nov-1950

## 2021-02-10 ENCOUNTER — Encounter: Payer: Self-pay | Admitting: Physical Therapy

## 2021-02-10 ENCOUNTER — Ambulatory Visit: Payer: Medicare Other | Admitting: Occupational Therapy

## 2021-02-10 ENCOUNTER — Other Ambulatory Visit: Payer: Self-pay

## 2021-02-10 ENCOUNTER — Ambulatory Visit: Payer: Medicare Other

## 2021-02-10 ENCOUNTER — Ambulatory Visit: Payer: Medicare Other | Admitting: Physical Therapy

## 2021-02-10 DIAGNOSIS — R293 Abnormal posture: Secondary | ICD-10-CM | POA: Diagnosis not present

## 2021-02-10 DIAGNOSIS — R2681 Unsteadiness on feet: Secondary | ICD-10-CM | POA: Diagnosis not present

## 2021-02-10 DIAGNOSIS — R278 Other lack of coordination: Secondary | ICD-10-CM | POA: Diagnosis not present

## 2021-02-10 DIAGNOSIS — R2689 Other abnormalities of gait and mobility: Secondary | ICD-10-CM | POA: Diagnosis not present

## 2021-02-10 DIAGNOSIS — M25622 Stiffness of left elbow, not elsewhere classified: Secondary | ICD-10-CM | POA: Diagnosis not present

## 2021-02-10 DIAGNOSIS — M25611 Stiffness of right shoulder, not elsewhere classified: Secondary | ICD-10-CM

## 2021-02-10 DIAGNOSIS — R29818 Other symptoms and signs involving the nervous system: Secondary | ICD-10-CM | POA: Diagnosis not present

## 2021-02-10 DIAGNOSIS — M25612 Stiffness of left shoulder, not elsewhere classified: Secondary | ICD-10-CM | POA: Diagnosis not present

## 2021-02-10 DIAGNOSIS — M25621 Stiffness of right elbow, not elsewhere classified: Secondary | ICD-10-CM | POA: Diagnosis not present

## 2021-02-10 DIAGNOSIS — M6281 Muscle weakness (generalized): Secondary | ICD-10-CM

## 2021-02-10 DIAGNOSIS — R41841 Cognitive communication deficit: Secondary | ICD-10-CM

## 2021-02-10 DIAGNOSIS — R471 Dysarthria and anarthria: Secondary | ICD-10-CM | POA: Diagnosis not present

## 2021-02-10 NOTE — Patient Instructions (Signed)
WALKING  Walking is a great form of exercise to increase your strength, endurance and overall fitness.  A walking program can help you start slowly and gradually build endurance as you go.  Everyone's ability is different, so each person's starting point will be different.  You do not have to follow them exactly.  The are just samples. You should simply find out what's right for you and stick to that program.   In the beginning, you'll start off walking 2-3 times a day for short distances.  As you get stronger, you'll be walking further at just 1-2 times per day.  A. You Can Walk For A Certain Length Of Time Each Day    Walk 5 minutes 3 times per day.  Increase 2 minutes every 2 days (3 times per day).  Work up to 25-30 minutes (1-2 times per day).   Example:   Day 1-2 5 minutes 3 times per day   Day 7-8 12 minutes 2-3 times per day   Day 13-14 20 minutes 1-2 times per day

## 2021-02-10 NOTE — Therapy (Signed)
[facility] [address] [place], [place], [address] Phone: [phone]   Fax:  [phone]  Occupational Therapy Treatment  Patient Details  Name: April Larson MRN: [id number] Date of Birth: [birth year] Referring Provider (OT): Dr. Rexene Alberts   Encounter Date: 02/10/2021   OT End of Session - 02/10/21 1439     Visit Number 6    Number of Visits 25    Date for OT Re-Evaluation 03/31/21    Authorization - Visit Number 6    Authorization - Number of Visits 10    Progress Note Due on Visit 10    OT Start Time 5885    OT Stop Time 1445    OT Time Calculation (min) 40 min    Behavior During Therapy [facility] for tasks assessed/performed             Past Medical History:  Diagnosis Date   Diabetes mellitus without complication ([place])    Hypertension    Substance abuse ([place])     Past Surgical History:  Procedure Laterality Date   EYE SURGERY      There were no vitals filed for this visit.          Pt denies pain                OT Education - 02/10/21 1425     Education Details PWR! moves basic 4 seated 10 reps each, min v.c and demonstration. Coordination activities review- flipping and dealing cards, picking up and stacking coins, rotate ball , education regarding memory compensations/ keeping thinking skills sharp, see handout    Person(s) Educated Patient;Other (comment)   brother   Methods Explanation;Demonstration;Verbal cues;Handout    Comprehension Verbalized understanding;Returned demonstration;Verbal cues required              OT Short Term Goals - 02/10/21 1416       OT SHORT TERM GOAL #1   Title I with PD specific HEP    Status Achieved      OT SHORT TERM GOAL #2   Title Pt will verbalize understanding of adapted strategies to maximize safety and I with ADLs/ IADLs (ie: cutting food, fasten buttons, writing)    Status On-going      OT SHORT TERM GOAL #3    Title Pt will verbalize understanding of compensatory strategies  for visual deficits.    Status On-going      OT SHORT TERM GOAL #4   Title Pt will demonstrate improved fine motor coordination for ADLs as evidenced by decreasing 9 hole peg test score for RUE by 3 secs    Status On-going      OT SHORT TERM GOAL #5   Title assess PPT#4 don/ doff jacket and set goal prn    Status Deferred   13.72, pt in structed in adapted techniques after demo     OT SHORT TERM GOAL #6   Title Pt will demonstrate understanding of memory compensations and ways to keep thinking skills sharp    Status Achieved   issued 02/10/21              OT Long Term Goals - 02/05/21 1516       OT LONG TERM GOAL #1   Title Pt will write a short paragraph with 100% legibility and  minimal decrease in letter size    Time 12    Period Weeks    Status On-going      OT  LONG TERM GOAL #2   Title Pt will demonstrate ability to retrieve a lightweight object at 115 shoulder flexion and -15 elbow extension with RUE    Time 12    Period Weeks    Status On-going      OT LONG TERM GOAL #3   Title Pt will demonstrate ability to retrieve a lightweight object at 120 shoulder flexion and -15 elbow extension with LUE    Time 12    Period Weeks    Status On-going      OT LONG TERM GOAL #4   Title Pt will demonstrate improved ease with feeding as evidenced by decreasing PPT#2 by 3 secs    Time 12    Period Weeks    Status On-going      OT LONG TERM GOAL #5   Title Pt will demonstrate improved ease with fastening buttons as evidenced by decreasing 3 button/ unbutton time to 70 secs or less    Baseline 83 secs    Time 12    Period Weeks    Status On-going      OT LONG TERM GOAL #6   Title Pt will demonstrate improved bilateral UE functional use for ADLs as evidenced by increasing box/ blocks score by 4 blocks with right and left UE's    Baseline R 41, L 39    Time 12    Period Weeks    Status On-going                    Plan - 02/10/21 1440     Clinical Impression Statement Pt is progressing towards goals. She demonstrates improved handwriting with priniting and review.    OT Occupational Profile and History Detailed Assessment- Review of Records and additional review of physical, cognitive, psychosocial history related to current functional performance    Occupational performance deficits (Please refer to evaluation for details): ADL's;IADL's;Leisure;Social Participation;Play;Work    Marketing executive / Function / Physical Skills ADL;Endurance;UE functional use;Balance;Vision;Flexibility;FMC;ROM;Gait;Coordination;GMC;Sensation;Decreased knowledge of precautions;Decreased knowledge of use of DME;IADL;Strength;Dexterity;Mobility;Tone    Cognitive Skills Memory;Problem Solve;Safety Awareness;Thought    Rehab Potential Good    Clinical Decision Making Limited treatment options, no task modification necessary    Comorbidities Affecting Occupational Performance: May have comorbidities impacting occupational performance    Modification or Assistance to Complete Evaluation  No modification of tasks or assist necessary to complete eval    OT Frequency 2x / week   plus eval, anticipate d/c following 8 weeks dependent upon pt progress.   OT Duration 12 weeks    OT Treatment/Interventions Self-care/ADL training;Energy conservation;Visual/perceptual remediation/compensation;Patient/family education;DME and/or AE instruction;Gait Training;Passive range of motion;Balance training;Fluidtherapy;Cryotherapy;Electrical Stimulation;Functional Mobility Training;Splinting;Therapeutic activities;Manual Therapy;Therapeutic exercise;Moist Heat;Neuromuscular education;Cognitive remediation/compensation    Plan reinforce ADL strategies,    Consulted and Agree with Plan of Care Patient             Patient will benefit from skilled therapeutic intervention in order to improve the following deficits and impairments:    Body Structure / Function / Physical Skills: ADL, Endurance, UE functional use, Balance, Vision, Flexibility, FMC, ROM, Gait, Coordination, GMC, Sensation, Decreased knowledge of precautions, Decreased knowledge of use of DME, IADL, Strength, Dexterity, Mobility, Tone Cognitive Skills: Memory, Problem Solve, Safety Awareness, Thought     Visit Diagnosis: Other abnormalities of gait and mobility  Muscle weakness (generalized)  Other symptoms and signs involving the nervous system  Abnormal posture  Other lack of coordination  Stiffness of right elbow, not elsewhere classified  Stiffness of right shoulder, not elsewhere classified  Stiffness of left shoulder, not elsewhere classified    Problem List Patient Active Problem List   Diagnosis Date Noted   Decreased dorsalis pedis pulse 11/17/2020   Tachycardia 08/11/2020   Cough 08/11/2020   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   Pure hypercholesterolemia 01/23/2020   Vitamin D deficiency 10/23/2019   Right hip pain 05/15/2018   Estrogen deficiency 05/15/2018   Class 1 obesity due to excess calories with serious comorbidity and body mass index (BMI) of 31.0 to 31.9 in adult 05/15/2018   Diabetes mellitus with stage 2 chronic kidney disease ([place]) 01/11/2018   Benign hypertension with chronic kidney disease, stage II 01/11/2018    Undra Harriman, OT/L 02/10/2021, 2:43 PM  [facility] [address] [place], [place], [address] Phone: [phone]   Fax:  [phone]  Name: April Larson MRN: [id number] Date of Birth: [birth year]

## 2021-02-10 NOTE — Patient Instructions (Signed)
Memory Compensation Strategies  Use "WARM" strategy. W= write it down A=  associate it R=  repeat it M=  make a mental picture  You can keep a Memory Notebook. Use a 3-ring notebook with sections for the following:  calendar, important names and phone numbers, medications, doctors' names/phone numbers, "to do list"/reminders, and a section to journal what you did each day  Use a calendar to write appointments down.  Write yourself a schedule for the day.  This can be placed on the calendar or in a separate section of the Memory Notebook.  Keeping a regular schedule can help memory.  Use medication organizer with sections for each day or morning/evening pills  You may need help loading it  Keep a basket, or pegboard by the door.   Place items that you need to take out with you in the basket or on the pegboard.  You may also want to include a message board for reminders.  Use sticky notes. Place sticky notes with reminders in a place where the task is performed.  For example:  "turn off the stove" placed by the stove, "lock the door" placed on the door at eye level, "take your medications" on the bathroom mirror or by the place where you normally take your medications  Use alarms, timers, and/or a reminder app. Use while cooking to remind yourself to check on food or as a reminder to take your medicine, or as a reminder to make a call, or as a reminder to perform another task, etc.     Keeping Thinking Skills Sharp: 1. Jigsaw puzzles 2. Card/board games 3. Talking on the phone/social events 4. Lum[employer]com or constant therapy 5. Online games 6. Word searches/crossword puzzles 7.  Logic puzzles 8. Aerobic exercise (stationary bike) 9. Eating balanced diet (fruits & veggies) 10. Drink water 11. Try something new--new recipe, hobby 12. Crafts

## 2021-02-10 NOTE — Therapy (Addendum)
[facility] [address] [place], [place], [address] Phone: [phone]   Fax:  [phone]  Physical Therapy Treatment  Patient Details  Name: April Larson MRN: [id number] Date of Birth: 1950-11-02 Referring Provider (PT): [name], MD   Encounter Date: 02/10/2021   PT End of Session - 02/10/21 1449     Visit Number 7    Number of Visits 17    Date for PT Re-Evaluation 04/09/21    Authorization Type UHC Medicare - needs 10th visit PN    PT Start Time 5726    PT Stop Time 1528    PT Time Calculation (min) 41 min    Activity Tolerance Patient tolerated treatment well    Behavior During Therapy [facility] for tasks assessed/performed             Past Medical History:  Diagnosis Date   Diabetes mellitus without complication ([place])    Hypertension    Substance abuse ([place])     Past Surgical History:  Procedure Laterality Date   EYE SURGERY      There were no vitals filed for this visit.   Subjective Assessment - 02/10/21 1448     Subjective No falls since she was last here. Has all of her exercises in folders. Has been doing her exercises at home.    Patient is accompained by: Family member   Sister, April Larson   Pertinent History PMH significant for hypertension, diabetes, reflux disease, allergies, sleep apnea and borderline obesity, hx of rib fx s/p fall    Limitations Walking;House hold activities    Diagnostic tests CT shows generalized atrophy and white matter disease , DaTSCAN results are suggestive of Parkinsonian syndromes.    Patient Stated Goals Wants to work on walking/balance.    Currently in Pain? No/denies                                Pt performs PWR! Moves in  standing position:    PWR! Up for improved posture x10 reps - cues for R elbow extension and for proper mini squat position.    PWR! Rock for improved weighshifting x10 reps reaching with cues to  look up to hands   PWR! Twist for improved trunk rotation x10 reps, cues to reset in the middle and to look at hands when twisting.    PWR! Step for improved step initiation x10 reps with cues for incr foot clearance and to look at hands when reaching.       Balance Exercises - 02/10/21 1507       Balance Exercises: Standing   Standing Eyes Opened Foam/compliant surface;Narrow base of support (BOS);Limitations    Standing Eyes Opened Limitations On air ex; 2 x 10 reps head turns, 2 x 10 reps head nods - giving pt visual target with head turns and tracking with eyes/ incr difficulty to L    Stepping Strategy Anterior;Posterior;UE support    Stepping Strategy Limitations Forward stepping over 4" beam, alternating legs x10 reps each leg with UE support- incr difficulty with SLS on RLE, x10 reps alternating posterior stepping strategy with UE support    Step Over Hurdles / Cones Down and back x5 reps at countertop - first with step to pattern leading with RLE and then alternating pattern with no UE support, cues for slowed and controlled.    Other Standing Exercises On air ex in corner:  trunk rotations laterally x7 reps each side, then superior laterally x7 reps each side - cues to look at hands when performing twist    Other Standing Exercises Comments On air ex: x10 reps heel <> toe raises with BUE support                PT Education - 02/10/21 1455     Education Details Reviewed walking program and printed out instructions for pt to keep in PT folder.    Person(s) Educated Patient   pt's brother   Methods Explanation;Handout    Comprehension Verbalized understanding              PT Short Term Goals - 02/05/21 1548       PT SHORT TERM GOAL #1   Title Pt will be independent with initial HEP in order to build upon functional gains made in threrapy. ALL STGS DUE 02/06/21    Baseline Pt still needs some cuing with HEP. Sister has been helping her but pt has not been performing  all exercises. They are going to also start going to gym to walk on track.    Time 4    Period Weeks    Status Partially Met    Target Date 02/06/21      PT SHORT TERM GOAL #2   Title Pt will decr 5x sit <> stand with no UE support to 18.5 seconds or less in order to decr fall risk/improve functional BLE strength.    Baseline 21.50 seconds. 02/05/21 19.35 sec    Time 4    Period Weeks    Status Partially Met      PT SHORT TERM GOAL #3   Title Pt will decr manual TUG to 13 seconds or less in order to demo decr fall risk.    Baseline 14.75 seconds; 13.97 seconds on 02/02/21    Time 4    Period Weeks    Status Not Met      PT SHORT TERM GOAL #4   Title Pt and pt's family will verbalize understanding of fall prevention in the home.    Baseline handout provided on 02/02/21    Time 4    Period Weeks    Status Achieved               PT Long Term Goals - 01/21/21 2376       PT LONG TERM GOAL #1   Title Pt will be independent with final HEP in order to build upon functional gains made in threrapy. ALL LTGS DUE 03/06/21    Time 8    Period Weeks    Status New      PT LONG TERM GOAL #2   Title Pt will decr 5x sit <> stand with no UE support to 16.5 seconds or less in order to decr fall risk/improve functional BLE strength.    Baseline 21.5 seconds    Time 8    Period Weeks    Status New      PT LONG TERM GOAL #3   Title Pt will verbalize understanding of local Parkinson's disease resources.    Time 8    Period Weeks    Status New      PT LONG TERM GOAL #4   Title Pt will improve miniBEST score to at least a 24/28 in order to demo decr fall risk.    Baseline 21/28    Time 8    Period Weeks  Status Revised      PT LONG TERM GOAL #5   Title Pt will recover posterior balance in push and release test in 1 step independently, for improved balance recovery    Baseline 2 small steps    Time 8    Period Weeks    Status New                   Plan - 02/10/21  2006     Clinical Impression Statement Reviewed walking program given at last session for pt to perform at the gym at her church and printed out instructions for incr carryover for home. Worked on standing balance on compliant surfaces with trunk rotations, head motions, and SLS/foot clearance. Pt challenged by SLS task on RLE. Will continue to progress towards LTGs.    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 PMH significant for hypertension, diabetes, reflux disease, sleep apnea and borderline obesity, hx of rib fx s/p fall.    Examination-Activity Limitations Transfers;Stairs;Locomotion Level    Examination-Participation Restrictions Community Activity;Driving;Occupation    Stability/Clinical Decision Making Evolving/Moderate complexity    Rehab Potential Good    PT Frequency 2x / week    PT Duration 12 weeks    PT Treatment/Interventions ADLs/Self Care Home Management;DME Instruction;Therapeutic activities;Functional mobility training;Stair training;Gait training;Therapeutic exercise;Balance training;Neuromuscular re-education;Patient/family education;Vestibular    PT Next Visit Plan how is walking program and HEP? [employer] for aerobic activity. Gait training with incr effort/ arm swing.balance on unlevel surfaces. lateral/posterior stepping strategies. SLS tasks. Having pt scan with eyes/head during balance tasks.    Consulted and Agree with Plan of Care Patient;Family member/caregiver    Family Member Consulted pt's brother sebastian             Patient will benefit from skilled therapeutic intervention in order to improve the following deficits and impairments:  Abnormal gait, Decreased activity tolerance, Decreased coordination, Decreased strength, Impaired tone, Postural dysfunction, Difficulty walking, Decreased balance, Decreased safety  awareness  Visit Diagnosis: Other abnormalities of gait and mobility  Muscle weakness (generalized)  Unsteadiness on feet     Problem List Patient Active Problem List   Diagnosis Date Noted   Decreased dorsalis pedis pulse 11/17/2020   Tachycardia 08/11/2020   Cough 08/11/2020   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   Pure hypercholesterolemia 01/23/2020   Vitamin D deficiency 10/23/2019   Right hip pain 05/15/2018   Estrogen deficiency 05/15/2018   Class 1 obesity due to excess calories with serious comorbidity and body mass index (BMI) of 31.0 to 31.9 in adult 05/15/2018   Diabetes mellitus with stage 2 chronic kidney disease ([place]) 01/11/2018   Benign hypertension with chronic kidney disease, stage II 01/11/2018    Arliss Journey, PT, DPT  02/10/2021, 8:08 PM  [facility] [address] [place], [place], [address] Phone: [phone]   Fax:  [phone]  Name: Lovell Vilikia Watkins Larson MRN: [id number] Date of Birth: [birth year]

## 2021-02-11 ENCOUNTER — Ambulatory Visit: Payer: Medicare Other | Admitting: Physical Therapy

## 2021-02-11 ENCOUNTER — Encounter: Payer: Self-pay | Admitting: Occupational Therapy

## 2021-02-11 NOTE — Therapy (Signed)
[facility] [address] [place], [place], [address] Phone: [phone]   Fax:  [phone]  Speech Language Pathology Treatment  Patient Details  Name: April Larson MRN: [id number] Date of Birth: Nov 15, 1950 Referring Provider (SLP): [name], MD   Encounter Date: 02/10/2021   End of Session - 02/10/21 1616     Visit Number 7    Number of Visits 25    Date for SLP Re-Evaluation 04/03/21    Authorization Type UHC Medicare    SLP Start Time 1616    SLP Stop Time  1700    SLP Time Calculation (min) 44 min    Activity Tolerance Patient tolerated treatment well             Past Medical History:  Diagnosis Date   Diabetes mellitus without complication ([place])    Hypertension    Substance abuse ([place])     Past Surgical History:  Procedure Laterality Date   EYE SURGERY      There were no vitals filed for this visit.   Subjective Assessment - 02/10/21 1617     Subjective "doing therapy"    Currently in Pain? No/denies                   ADULT SLP TREATMENT - 02/10/21 1616       General Information   Behavior/Cognition Alert;Cooperative;Pleasant mood;Requires cueing      Treatment Provided   Treatment provided Cognitive-Linquistic      Cognitive-Linquistic Treatment   Treatment focused on Cognition;Dysarthria    Skilled Treatment Pt returned with homework, although pt missed 3 pages. Pt attempted to complete sequencing but pt forgot targeted strategy 1/3 way through task which resulted in errors. Pt able to complete task with intermittent fading to rare SLP assistance. Of note, pt noted to maintain eye contact with SLP while setting down water, which almost resulted in spilling water cup. SLP inquired, in which pt stated "I need to pay attention." SLP reviewed dysarthria HEP, as pt has only been completing loud /a/. SLP provided education and rationale for entirety of HEP. SLP  trialed paragraph passage readings, in which SLP provided mod A to optimize breath support due to poor coordination and decreased comprehension. Volume ranged from upper 60s to low 70s dB for passages.      Assessment / Recommendations / Plan   Plan Continue with current plan of care      Progression Toward Goals   Progression toward goals Progressing toward goals              SLP Education - 02/11/21 0804     Education Details attention, dysarthria HEP    Person(s) Educated Patient;Other (comment)   brother   Methods Explanation;Demonstration;Verbal cues;Handout    Comprehension Verbalized understanding;Returned demonstration;Verbal cues required;Need further instruction              SLP Short Term Goals - 02/10/21 1617       SLP SHORT TERM GOAL #1   Title Pt will utilize memory and attention compensations to optimize performance at work and home given occasional min A over 2 sessions    Baseline 02-05-21    Time 4    Period Weeks    Status Partially Met    Target Date 02/06/21      SLP SHORT TERM GOAL #2   Title Pt will identify and correct errors on structured therapy tasks with 80% accuracy given occasional min A  over 2 sessions    Time 4    Period Weeks    Status Not Met    Target Date 02/06/21      SLP SHORT TERM GOAL #3   Title Pt will complete dysarthria HEP given occasional min A over 3 sessions    Baseline 02-05-21    Time 4    Period Weeks    Status Partially Met    Target Date 02/06/21      SLP SHORT TERM GOAL #4   Title Pt will sustain loud /a/ with average of low 90s dB over 3 sessions    Time 4    Period Weeks    Status Not Met    Target Date 02/06/21      SLP SHORT TERM GOAL #5   Title Pt will maintain WNL conversational volume (70-72 dB) during 5 minute conversation given occasional min A over 2 sessions    Time 4    Period Weeks    Status Not Met    Target Date 02/06/21              SLP Long Term Goals - 02/10/21 1617        SLP LONG TERM GOAL #1   Title Pt will utilize memory and attention compensations to optimize performance at work and home given rare min A over 2 sessions    Time 8    Period Weeks    Status On-going    Target Date 03/06/21      SLP LONG TERM GOAL #2   Title Pt will identify and correct errors on structured therapy tasks with 90% accuracy given rare min A over 2 sessions    Time 8    Period Weeks    Status On-going    Target Date 03/06/21      SLP LONG TERM GOAL #3   Title Pt will complete dysarthria HEP BID > 2 weeks    Time 12    Period Weeks    Status On-going    Target Date 04/03/21      SLP LONG TERM GOAL #4   Title Pt will maintain WNL conversational volume (70-72 dB) during 15+ minute conversation given rare min A over 2 sessions    Time 12    Period Weeks    Status On-going    Target Date 04/03/21              Plan - 02/10/21 1617     Clinical Impression Statement "[name]" was referred for OPST to address dysarthria and 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 SLP conducted ongoing education and training of dysarthria HEP and compensations to optimize vocal intensity as limited independent completion reported. SLP targeted recall, problem solving, and attention for structured tasks, with occasional to rare min A required to ID and correct errors for familiar task. Due to cognitive and speech decline impacting daily living tasks, skilled ST is warranted to optimize pt's functional independence, safety, and communication effectiveness.    Speech Therapy Frequency 2x / week    Duration 12 weeks    Treatment/Interventions Cognitive reorganization;Multimodal communcation approach;Language facilitation;Compensatory techniques;Internal/external aids;SLP instruction and feedback;Patient/family education;Functional tasks;Cueing hierarchy;Compensatory strategies    Potential to Achieve Goals Good    Consulted and Agree with Plan of Care  Patient             Patient will benefit from skilled therapeutic intervention in order to improve the following deficits and impairments:  Cognitive communication deficit  Dysarthria and anarthria    Problem List Patient Active Problem List   Diagnosis Date Noted   Decreased dorsalis pedis pulse 11/17/2020   Tachycardia 08/11/2020   Cough 08/11/2020   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   Pure hypercholesterolemia 01/23/2020   Vitamin D deficiency 10/23/2019   Right hip pain 05/15/2018   Estrogen deficiency 05/15/2018   Class 1 obesity due to excess calories with serious comorbidity and body mass index (BMI) of 31.0 to 31.9 in adult 05/15/2018   Diabetes mellitus with stage 2 chronic kidney disease ([place]) 01/11/2018   Benign hypertension with chronic kidney disease, stage II 01/11/2018    Alinda Deem, MA CCC-SLP 02/11/2021, 8:07 AM  [facility] [address] [place], [place], [address] Phone: [phone]   Fax:  [phone]   Name: Armoni Vilikia Watkins Larson MRN: [id number] Date of Birth: Feb 15, 1951

## 2021-02-12 ENCOUNTER — Ambulatory Visit: Payer: Medicare Other | Admitting: Internal Medicine

## 2021-02-13 ENCOUNTER — Encounter: Payer: Self-pay | Admitting: Occupational Therapy

## 2021-02-13 ENCOUNTER — Ambulatory Visit: Payer: Medicare Other | Admitting: Occupational Therapy

## 2021-02-13 ENCOUNTER — Ambulatory Visit: Payer: Medicare Other

## 2021-02-13 ENCOUNTER — Other Ambulatory Visit: Payer: Self-pay

## 2021-02-13 DIAGNOSIS — M25621 Stiffness of right elbow, not elsewhere classified: Secondary | ICD-10-CM

## 2021-02-13 DIAGNOSIS — R2689 Other abnormalities of gait and mobility: Secondary | ICD-10-CM

## 2021-02-13 DIAGNOSIS — R29818 Other symptoms and signs involving the nervous system: Secondary | ICD-10-CM

## 2021-02-13 DIAGNOSIS — R471 Dysarthria and anarthria: Secondary | ICD-10-CM

## 2021-02-13 DIAGNOSIS — M6281 Muscle weakness (generalized): Secondary | ICD-10-CM | POA: Diagnosis not present

## 2021-02-13 DIAGNOSIS — M25611 Stiffness of right shoulder, not elsewhere classified: Secondary | ICD-10-CM | POA: Diagnosis not present

## 2021-02-13 DIAGNOSIS — R278 Other lack of coordination: Secondary | ICD-10-CM

## 2021-02-13 DIAGNOSIS — M25622 Stiffness of left elbow, not elsewhere classified: Secondary | ICD-10-CM | POA: Diagnosis not present

## 2021-02-13 DIAGNOSIS — R293 Abnormal posture: Secondary | ICD-10-CM | POA: Diagnosis not present

## 2021-02-13 DIAGNOSIS — M25612 Stiffness of left shoulder, not elsewhere classified: Secondary | ICD-10-CM

## 2021-02-13 DIAGNOSIS — R2681 Unsteadiness on feet: Secondary | ICD-10-CM | POA: Diagnosis not present

## 2021-02-13 DIAGNOSIS — R41841 Cognitive communication deficit: Secondary | ICD-10-CM

## 2021-02-13 NOTE — Therapy (Signed)
[facility] [address] [place], [place], [address] Phone: [phone]   Fax:  [phone]  Occupational Therapy Treatment  Patient Details  Name: April Larson MRN: [id number] Date of Birth: Nov 04, 1950 Referring Provider (OT): Dr. Rexene Alberts   Encounter Date: 02/13/2021   OT End of Session - 02/13/21 0808     Visit Number 7    Number of Visits 25    Date for OT Re-Evaluation 03/31/21    Authorization - Visit Number 7    Authorization - Number of Visits 10    Progress Note Due on Visit 10    OT Start Time 0802    OT Stop Time 0840    OT Time Calculation (min) 38 min    Behavior During Therapy [facility] for tasks assessed/performed             Past Medical History:  Diagnosis Date   Diabetes mellitus without complication ([place])    Hypertension    Substance abuse ([place])     Past Surgical History:  Procedure Laterality Date   EYE SURGERY      There were no vitals filed for this visit.   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 PMH significant for hypertension, diabetes, reflux disease, allergies, sleep apnea and borderline obesity, hx of rib fx s/p fall,    Patient Stated Goals maintain independence    Currently in Pain? No/denies                    Treatment: grooved pegs with RUE for increased fine motor coordination and in hand manipulation, min-mod difficulty, v.c Opening drink bottle with big movements, min v.c             OT Education - 02/13/21 1427     Education Details PWR! moves basic 4 supine 10 reps each, min v.c and demonstration.Education regarding visual compensation strategies, handout issued. Therapist reinforced that pt must use compensationas by turning her head when driving, due to visual deficits. Pt's sister plans to ride with her to see how she is doing with  driving.    Person(s) Educated Patient;Other (comment)   sister   Methods Explanation;Demonstration;Verbal cues;Handout    Comprehension Verbalized understanding;Returned demonstration;Verbal cues required              OT Short Term Goals - 02/10/21 1416       OT SHORT TERM GOAL #1   Title I with PD specific HEP    Status Achieved      OT SHORT TERM GOAL #2   Title Pt will verbalize understanding of adapted strategies to maximize safety and I with ADLs/ IADLs (ie: cutting food, fasten buttons, writing)    Status On-going      OT SHORT TERM GOAL #3   Title Pt will verbalize understanding of compensatory strategies  for visual deficits.    Status On-going      OT SHORT TERM GOAL #4   Title Pt will demonstrate improved fine motor coordination for ADLs as evidenced by decreasing 9 hole peg test score for RUE by 3 secs    Status On-going      OT SHORT TERM GOAL #5   Title assess PPT#4 don/ doff jacket and set goal prn    Status Deferred   13.72, pt in structed in adapted techniques after demo  OT SHORT TERM GOAL #6   Title Pt will demonstrate understanding of memory compensations and ways to keep thinking skills sharp    Status Achieved   issued 02/10/21              OT Long Term Goals - 02/05/21 1516       OT LONG TERM GOAL #1   Title Pt will write a short paragraph with 100% legibility and  minimal decrease in letter size    Time 12    Period Weeks    Status On-going      OT LONG TERM GOAL #2   Title Pt will demonstrate ability to retrieve a lightweight object at 115 shoulder flexion and -15 elbow extension with RUE    Time 12    Period Weeks    Status On-going      OT LONG TERM GOAL #3   Title Pt will demonstrate ability to retrieve a lightweight object at 120 shoulder flexion and -15 elbow extension with LUE    Time 12    Period Weeks    Status On-going      OT LONG TERM GOAL #4   Title Pt will demonstrate improved ease with feeding as evidenced by  decreasing PPT#2 by 3 secs    Time 12    Period Weeks    Status On-going      OT LONG TERM GOAL #5   Title Pt will demonstrate improved ease with fastening buttons as evidenced by decreasing 3 button/ unbutton time to 70 secs or less    Baseline 83 secs    Time 12    Period Weeks    Status On-going      OT LONG TERM GOAL #6   Title Pt will demonstrate improved bilateral UE functional use for ADLs as evidenced by increasing box/ blocks score by 4 blocks with right and left UE's    Baseline R 41, L 39    Time 12    Period Weeks    Status On-going                   Plan - 02/13/21 1430     Clinical Impression Statement Pt is progressing towards goals. she demonstrates improving carryover of movement strategies with repetition.    OT Occupational Profile and History Detailed Assessment- Review of Records and additional review of physical, cognitive, psychosocial history related to current functional performance    Occupational performance deficits (Please refer to evaluation for details): ADL's;IADL's;Leisure;Social Participation;Play;Work    Marketing executive / Function / Physical Skills ADL;Endurance;UE functional use;Balance;Vision;Flexibility;FMC;ROM;Gait;Coordination;GMC;Sensation;Decreased knowledge of precautions;Decreased knowledge of use of DME;IADL;Strength;Dexterity;Mobility;Tone    Cognitive Skills Memory;Problem Solve;Safety Awareness;Thought    Rehab Potential Good    Clinical Decision Making Limited treatment options, no task modification necessary    Comorbidities Affecting Occupational Performance: May have comorbidities impacting occupational performance    Modification or Assistance to Complete Evaluation  No modification of tasks or assist necessary to complete eval    OT Frequency 2x / week   plus eval, anticipate d/c following 8 weeks dependent upon pt progress.   OT Duration 12 weeks    OT Treatment/Interventions Self-care/ADL training;Energy  conservation;Visual/perceptual remediation/compensation;Patient/family education;DME and/or AE instruction;Gait Training;Passive range of motion;Balance training;Fluidtherapy;Cryotherapy;Electrical Stimulation;Functional Mobility Training;Splinting;Therapeutic activities;Manual Therapy;Therapeutic exercise;Moist Heat;Neuromuscular education;Cognitive remediation/compensation    Plan reinforce ADL strategies, fine motor coordination, work towards goals.    Consulted and Agree with Plan of Care Patient  Patient will benefit from skilled therapeutic intervention in order to improve the following deficits and impairments:   Body Structure / Function / Physical Skills: ADL, Endurance, UE functional use, Balance, Vision, Flexibility, FMC, ROM, Gait, Coordination, GMC, Sensation, Decreased knowledge of precautions, Decreased knowledge of use of DME, IADL, Strength, Dexterity, Mobility, Tone Cognitive Skills: Memory, Problem Solve, Safety Awareness, Thought     Visit Diagnosis: Other abnormalities of gait and mobility  Muscle weakness (generalized)  Other symptoms and signs involving the nervous system  Abnormal posture  Other lack of coordination  Stiffness of right elbow, not elsewhere classified  Stiffness of right shoulder, not elsewhere classified  Stiffness of left shoulder, not elsewhere classified    Problem List Patient Active Problem List   Diagnosis Date Noted   Decreased dorsalis pedis pulse 11/17/2020   Tachycardia 08/11/2020   Cough 08/11/2020   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   Pure hypercholesterolemia 01/23/2020   Vitamin D deficiency 10/23/2019   Right hip pain 05/15/2018   Estrogen deficiency 05/15/2018   Class 1 obesity due to excess calories with serious comorbidity and body mass index (BMI) of 31.0 to 31.9 in adult 05/15/2018   Diabetes mellitus with stage 2 chronic kidney disease ([place]) 01/11/2018   Benign hypertension with chronic kidney  disease, stage II 01/11/2018    Georgie Haque, OT/L 02/13/2021, 2:32 PM  [facility] [address] [place], [place], [address] Phone: [phone]   Fax:  [phone]  Name: Kashara Vilikia Watkins Larson MRN: [id number] Date of Birth: 1950/10/05

## 2021-02-13 NOTE — Patient Instructions (Signed)
  Please complete the assigned speech therapy homework prior to your next session and return it to the speech therapist at your next visit.  

## 2021-02-13 NOTE — Patient Instructions (Signed)
Visual compensation strategies-  Make sure that you are turning your head when you are walking and look side to side   If you are looking for items in the grocery store use an organized scan pattern, either left to right or top to bottom, make ure to move your head  Make sure to keep things out of the floor that you may trip over  When you are exercising watch your hand, you will need to turn your head  If you are driving make sure to turn your head and double check before changing lanes and pulling out

## 2021-02-13 NOTE — Therapy (Signed)
[facility] [address] [place], [place], [address] Phone: [phone]   Fax:  [phone]  Physical Therapy Treatment  Patient Details  Name: April Larson MRN: [id number] Date of Birth: [birth year] Referring Provider (PT): [name], MD   Encounter Date: 02/13/2021   PT End of Session - 02/13/21 0931     Visit Number 8    Number of Visits 17    Date for PT Re-Evaluation 04/09/21    Authorization Type UHC Medicare - needs 10th visit PN    PT Start Time 0931    PT Stop Time 1015    PT Time Calculation (min) 44 min    Equipment Utilized During Treatment Gait belt    Activity Tolerance Patient tolerated treatment well    Behavior During Therapy WFL for tasks assessed/performed             Past Medical History:  Diagnosis Date   Diabetes mellitus without complication ([place])    Hypertension    Substance abuse ([place])     Past Surgical History:  Procedure Laterality Date   EYE SURGERY      There were no vitals filed for this visit.   Subjective Assessment - 02/13/21 0932     Subjective Pt states she is doing good since the last visit. She states she has been doing the walking program and that she did 2 sets of 5 minutes on Wednesday and 1 set of 5 minutes yesterday.    Patient is accompained by: Family member   Sister, [name]   Pertinent History PMH significant for hypertension, diabetes, reflux disease, allergies, sleep apnea and borderline obesity, hx of rib fx s/p fall    Limitations Walking;House hold activities    Diagnostic tests CT shows generalized atrophy and white matter disease , DaTSCAN results are suggestive of Parkinsonian syndromes.    Patient Stated Goals Wants to work on walking/balance.    Currently in Pain? No/denies                               [facility] Adult PT Treatment/Exercise - 02/13/21 0937       Transfers   Transfers Sit to Stand;Stand to  Sit    Sit to Stand 5: Supervision    Sit to Stand Details (indicate cue type and reason) Pt was cued to keep weight on heels and to act as if you're sitting back in a chair. x10 rep. PT provided supervision.    Stand to Sit 5: Supervision    Comments Sit to stand x10  from edge of mat working on increasing anterior weight shift with keeping toes down.      Ambulation/Gait   Ambulation/Gait Yes    Ambulation/Gait Assistance 5: Supervision    Ambulation Distance (Feet) 460 Feet    Assistive device None    Gait Pattern Step-through pattern    Ambulation Surface Level;Indoor    Gait Comments Pt did a good job emphasizing arm swing today w/ her gait today.      Neuro Re-ed    Neuro Re-ed Details  Pt performed standing PWR! exercises at listed below. Pt performed 2x10 of side stepping over one hurdle on L and one hurdle on R with emphasis on quick and large side steps. Pt did one set w/ UE assist and one set w/o UE at counter. PT provided min guard and verbal cues for proper technique. Pt  performed 2x10 of diagnoal backwards steps to the R/L at counter. Pt did not require UE assist. PT provided min guard. Pt performed standing balance on airex at counter. Pt perfomred x30" w/ EO and then w/ head turns up/down and side to side x10 each. PT provided min guard. pt performed x30" on airex bad with EC at counter and then x10 head turns up/down and side to side. Pt demonstrated smaller turns w/ head turns with EC. PT provided min guard.      Knee/Hip Exercises: Aerobic   Nustep Pt perfromed 5 minutes on NU step at level 4 for emphasis on reciprocal movement and increasing activity tolerance.             Pt performs PWR! Moves in  standing position:    PWR! Up for improved posture x10 reps - cues for R elbow extension and for proper mini squat position.    PWR! Rock for improved weighshifting x10 reps reaching with cues to look up to hands   PWR! Twist for improved trunk rotation x10 reps, cues  to reset in the middle and to look at hands when twisting.    PWR! Step for improved step initiation x10 reps with cues for incr foot clearance and to look at hands when reaching.   Pt reported 10/10 fatigue after these exercises.        PT Short Term Goals - 02/05/21 1548       PT SHORT TERM GOAL #1   Title Pt will be independent with initial HEP in order to build upon functional gains made in threrapy. ALL STGS DUE 02/06/21    Baseline Pt still needs some cuing with HEP. Sister has been helping her but pt has not been performing all exercises. They are going to also start going to gym to walk on track.    Time 4    Period Weeks    Status Partially Met    Target Date 02/06/21      PT SHORT TERM GOAL #2   Title Pt will decr 5x sit <> stand with no UE support to 18.5 seconds or less in order to decr fall risk/improve functional BLE strength.    Baseline 21.50 seconds. 02/05/21 19.35 sec    Time 4    Period Weeks    Status Partially Met      PT SHORT TERM GOAL #3   Title Pt will decr manual TUG to 13 seconds or less in order to demo decr fall risk.    Baseline 14.75 seconds; 13.97 seconds on 02/02/21    Time 4    Period Weeks    Status Not Met      PT SHORT TERM GOAL #4   Title Pt and pt's family will verbalize understanding of fall prevention in the home.    Baseline handout provided on 02/02/21    Time 4    Period Weeks    Status Achieved               PT Long Term Goals - 01/21/21 1610       PT LONG TERM GOAL #1   Title Pt will be independent with final HEP in order to build upon functional gains made in threrapy. ALL LTGS DUE 03/06/21    Time 8    Period Weeks    Status New      PT LONG TERM GOAL #2   Title Pt will decr 5x sit <> stand with no UE  support to 16.5 seconds or less in order to decr fall risk/improve functional BLE strength.    Baseline 21.5 seconds    Time 8    Period Weeks    Status New      PT LONG TERM GOAL #3   Title Pt will verbalize  understanding of local Parkinson's disease resources.    Time 8    Period Weeks    Status New      PT LONG TERM GOAL #4   Title Pt will improve miniBEST score to at least a 24/28 in order to demo decr fall risk.    Baseline 21/28    Time 8    Period Weeks    Status Revised      PT LONG TERM GOAL #5   Title Pt will recover posterior balance in push and release test in 1 step independently, for improved balance recovery    Baseline 2 small steps    Time 8    Period Weeks    Status New                   Plan - 02/13/21 1142     Clinical Impression Statement Pt tolerated today's PT session well with no adverse s/s and appropriate fatigue at the conclusion of treatment. Today's session focused on static and dynamic balance as well as emphasizing big movements. Pt did a good job of emphasizing arm swing while ambulating today. Pt demonstrated deficits when having to step laterally and posteriorly especially w/ the LLE. PT will continue to progress pt as tolerated w/ activities to achieve pt's LTG.    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 PMH significant for hypertension, diabetes, reflux disease, sleep apnea and borderline obesity, hx of rib fx s/p fall.    Examination-Activity Limitations Transfers;Stairs;Locomotion Level    Examination-Participation Restrictions Community Activity;Driving;Occupation    Stability/Clinical Decision Making Evolving/Moderate complexity    Rehab Potential Good    PT Frequency 2x / week    PT Duration 12 weeks    PT Treatment/Interventions ADLs/Self Care Home Management;DME Instruction;Therapeutic activities;Functional mobility training;Stair training;Gait training;Therapeutic exercise;Balance training;Neuromuscular re-education;Patient/family education;Vestibular    PT Next Visit Plan how is walking program and  HEP? [employer] for aerobic activity. Gait training with incr effort/ arm swing.balance on unlevel surfaces. lateral/posterior stepping strategies. SLS tasks. Having pt scan with eyes/head during balance tasks.    Consulted and Agree with Plan of Care Patient;Family member/caregiver    Family Member Consulted pt's brother sebastian             Patient will benefit from skilled therapeutic intervention in order to improve the following deficits and impairments:  Abnormal gait, Decreased activity tolerance, Decreased coordination, Decreased strength, Impaired tone, Postural dysfunction, Difficulty walking, Decreased balance, Decreased safety awareness  Visit Diagnosis: Other abnormalities of gait and mobility  Abnormal posture  Unsteadiness on feet  Other symptoms and signs involving the nervous system     Problem List Patient Active Problem List   Diagnosis Date Noted   Decreased dorsalis pedis pulse 11/17/2020   Tachycardia 08/11/2020   Cough 08/11/2020   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   Pure hypercholesterolemia 01/23/2020   Vitamin D deficiency 10/23/2019   Right hip pain 05/15/2018   Estrogen deficiency 05/15/2018   Class 1 obesity due to excess calories with serious comorbidity and body mass index (BMI) of 31.0 to 31.9 in adult 05/15/2018   Diabetes  mellitus with stage 2 chronic kidney disease ([place]) 01/11/2018   Benign hypertension with chronic kidney disease, stage II 01/11/2018    Lottie Mussel, Student-PT 02/13/2021, 11:46 AM  [facility] [address] [place], [place], [address] Phone: [phone]   Fax:  [phone]  Name: April Larson MRN: [id number] Date of Birth: 12-19-1950

## 2021-02-13 NOTE — Therapy (Signed)
[facility] [address] [place], [place], [address] Phone: [phone]   Fax:  [phone]  Speech Language Pathology Treatment  Patient Details  Name: April Larson MRN: [id number] Date of Birth: 09-Sep-1950 Referring Provider (SLP): [name], MD   Encounter Date: 02/13/2021   End of Session - 02/13/21 0955     Visit Number 8    Number of Visits 25    Date for SLP Re-Evaluation 04/03/21    Authorization Type UHC Medicare    SLP Start Time 0848    SLP Stop Time  0930    SLP Time Calculation (min) 42 min    Activity Tolerance Patient tolerated treatment well             Past Medical History:  Diagnosis Date   Diabetes mellitus without complication ([place])    Hypertension    Substance abuse ([place])     Past Surgical History:  Procedure Laterality Date   EYE SURGERY      There were no vitals filed for this visit.   Subjective Assessment - 02/13/21 0851     Subjective "...and my homework. I didn't do the back pages."    Patient is accompained by: Family member   sister                  ADULT SLP TREATMENT - 02/13/21 0852       General Information   Behavior/Cognition Alert;Cooperative;Pleasant mood;Requires cueing      Treatment Provided   Treatment provided Cognitive-Linquistic      Pain Assessment   Pain Assessment No/denies pain      Cognitive-Linquistic Treatment   Treatment focused on Cognition;Dysarthria    Skilled Treatment Pt told SLP she did not know rationale for HEP (previous therapy note indicates pt was educated about this last session)- so SLP gave pt rationale and she agreed she needs to perform HEP at the prescribed frequency - she is completing approx half of the total reps. She read homework stimuli (detailed directions) with suboptimal volume, and req'd cues for attending to details in directions. Pt unaware of her errors on the worksheet. SLP had pt  read multisentence paragraph and average volume mid 60s dB. SLP diirected pt to perform loud /a/ with cue for breath support and SLP model for loudness. Average was in lower-mid 80s dB with consistent min A for loudness and breath support. Pt unaware what she was doing to make productions loudly. SLP suggested stronger abdominals and pt agreed. SLP went back to reading multi-sentence selections and again req'd cues to realize what she was doing for louder speech  - in mid-upper 60s dB. With mod A including SLP model, pt able to achieve upper 60s - lower 70s dB with reading multi-sentence stimuli. SLP provided two pages for details and also strongly encrouaged to complete written work, and /a/ and multi-sentence paragraphs as directed until next session.      Assessment / Recommendations / Plan   Plan Continue with current plan of care      Progression Toward Goals   Progression toward goals Progressing toward goals              SLP Education - 02/13/21 0954     Education Details rationale for /a/ and multisentence reading (creating muscle memory), abdomoinal muscles recruitment necessary for louder speech    Person(s) Educated Patient;Caregiver(s)    Methods Explanation;Demonstration;Verbal cues    Comprehension Verbalized understanding;Returned demonstration;Verbal cues required;Need  further instruction              SLP Short Term Goals - 02/10/21 1617       SLP SHORT TERM GOAL #1   Title Pt will utilize memory and attention compensations to optimize performance at work and home given occasional min A over 2 sessions    Baseline 02-05-21    Time 4    Period Weeks    Status Partially Met    Target Date 02/06/21      SLP SHORT TERM GOAL #2   Title Pt will identify and correct errors on structured therapy tasks with 80% accuracy given occasional min A over 2 sessions    Time 4    Period Weeks    Status Not Met    Target Date 02/06/21      SLP SHORT TERM GOAL #3   Title Pt  will complete dysarthria HEP given occasional min A over 3 sessions    Baseline 02-05-21    Time 4    Period Weeks    Status Partially Met    Target Date 02/06/21      SLP SHORT TERM GOAL #4   Title Pt will sustain loud /a/ with average of low 90s dB over 3 sessions    Time 4    Period Weeks    Status Not Met    Target Date 02/06/21      SLP SHORT TERM GOAL #5   Title Pt will maintain WNL conversational volume (70-72 dB) during 5 minute conversation given occasional min A over 2 sessions    Time 4    Period Weeks    Status Not Met    Target Date 02/06/21              SLP Long Term Goals - 02/13/21 0957       SLP LONG TERM GOAL #1   Title Pt will utilize memory and attention compensations to optimize performance at work and home given rare min A over 2 sessions    Time 8    Period Weeks    Status On-going    Target Date 03/06/21      SLP LONG TERM GOAL #2   Title Pt will identify and correct errors on structured therapy tasks with 90% accuracy given rare min A over 2 sessions    Time 8    Period Weeks    Status On-going    Target Date 03/06/21      SLP LONG TERM GOAL #3   Title Pt will complete dysarthria HEP BID > 2 weeks    Time 12    Period Weeks    Status On-going    Target Date 04/03/21      SLP LONG TERM GOAL #4   Title Pt will maintain WNL conversational volume (70-72 dB) during 15+ minute conversation given rare min A over 2 sessions    Time 12    Period Weeks    Status On-going    Target Date 04/03/21              Plan - 02/13/21 0956     Clinical Impression Statement "Arg[place]" was referred for OPST to address dysarthria and 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 SLP conducted ongoing education and training of dysarthria HEP and compensations to optimize vocal intensity as limited independent completion reported. Indirectly, SLP targeted recall, problem solving, and attention for structured tasks, with  regular A  necessary to ID and correct errors. Due to cognitive and speech decline impacting daily living tasks, skilled ST continues as warranted to optimize pt's functional independence, safety, and communication effectiveness.    Speech Therapy Frequency 2x / week    Duration 12 weeks    Treatment/Interventions Cognitive reorganization;Multimodal communcation approach;Language facilitation;Compensatory techniques;Internal/external aids;SLP instruction and feedback;Patient/family education;Functional tasks;Cueing hierarchy;Compensatory strategies    Potential to Achieve Goals Good    Consulted and Agree with Plan of Care Patient             Patient will benefit from skilled therapeutic intervention in order to improve the following deficits and impairments:   Dysarthria and anarthria  Cognitive communication deficit    Problem List Patient Active Problem List   Diagnosis Date Noted   Decreased dorsalis pedis pulse 11/17/2020   Tachycardia 08/11/2020   Cough 08/11/2020   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   Pure hypercholesterolemia 01/23/2020   Vitamin D deficiency 10/23/2019   Right hip pain 05/15/2018   Estrogen deficiency 05/15/2018   Class 1 obesity due to excess calories with serious comorbidity and body mass index (BMI) of 31.0 to 31.9 in adult 05/15/2018   Diabetes mellitus with stage 2 chronic kidney disease ([place]) 01/11/2018   Benign hypertension with chronic kidney disease, stage II 01/11/2018    [facility], CCC-SLP  02/13/2021, 9:58 AM  [facility] [address] [place], [place], [address] Phone: [phone]   Fax:  [phone]   Name: Aradhya Vilikia Watkins Larson MRN: [id number] Date of Birth: Jan 04, 1951

## 2021-02-16 ENCOUNTER — Other Ambulatory Visit (HOSPITAL_COMMUNITY)
Admission: RE | Admit: 2021-02-16 | Discharge: 2021-02-16 | Disposition: A | Discharge status: Home / Self Care | Payer: Medicare Other | Source: Ambulatory Visit | Attending: Internal Medicine | Admitting: Internal Medicine

## 2021-02-16 ENCOUNTER — Other Ambulatory Visit: Payer: Self-pay

## 2021-02-16 ENCOUNTER — Encounter: Payer: Self-pay | Admitting: Internal Medicine

## 2021-02-16 ENCOUNTER — Ambulatory Visit (INDEPENDENT_AMBULATORY_CARE_PROVIDER_SITE_OTHER): Payer: Medicare Other | Admitting: Internal Medicine

## 2021-02-16 VITALS — BP 136/78 | HR 74 | Temp 98.5°F | Ht 61.0 in | Wt 173.4 lb

## 2021-02-16 DIAGNOSIS — I129 Hypertensive chronic kidney disease with stage 1 through stage 4 chronic kidney disease, or unspecified chronic kidney disease: Secondary | ICD-10-CM

## 2021-02-16 DIAGNOSIS — Z6832 Body mass index (BMI) 32.0-32.9, adult: Secondary | ICD-10-CM

## 2021-02-16 DIAGNOSIS — Z87891 Personal history of nicotine dependence: Secondary | ICD-10-CM | POA: Diagnosis not present

## 2021-02-16 DIAGNOSIS — N182 Chronic kidney disease, stage 2 (mild): Secondary | ICD-10-CM

## 2021-02-16 DIAGNOSIS — Z124 Encounter for screening for malignant neoplasm of cervix: Secondary | ICD-10-CM

## 2021-02-16 DIAGNOSIS — Z1151 Encounter for screening for human papillomavirus (HPV): Secondary | ICD-10-CM | POA: Insufficient documentation

## 2021-02-16 DIAGNOSIS — Z01419 Encounter for gynecological examination (general) (routine) without abnormal findings: Secondary | ICD-10-CM

## 2021-02-16 DIAGNOSIS — E1122 Type 2 diabetes mellitus with diabetic chronic kidney disease: Secondary | ICD-10-CM

## 2021-02-16 DIAGNOSIS — E6609 Other obesity due to excess calories: Secondary | ICD-10-CM

## 2021-02-16 NOTE — Patient Instructions (Signed)

## 2021-02-16 NOTE — Progress Notes (Signed)
Rich Brave [place],acting as a Education administrator for Maximino Greenland, MD.,have documented all relevant documentation on the behalf of Maximino Greenland, MD,as directed by  Maximino Greenland, MD while in the presence of Maximino Greenland, MD.  This visit occurred during the SARS-CoV-2 public health emergency.  Safety protocols were in place, including screening questions prior to the visit, additional usage of staff PPE, and extensive cleaning of exam room while observing appropriate contact time as indicated for disinfecting solutions.  Subjective:     Patient ID: April Larson , female    DOB: 07/12/1950 , 70 y.o.   MRN: [id number]   Chief Complaint  Patient presents with   Diabetes   Hypertension    HPI  She is here today for DM/HTN check. She reports compliance with meds. Denies having any headaches, chest pain and shortness of breath.   She wants to have pap smear today.   Diabetes She presents for her follow-up diabetic visit. She has type 2 diabetes mellitus. Pertinent negatives for hypoglycemia include no headaches. Pertinent negatives for diabetes include no blurred vision and no chest pain. Risk factors for coronary artery disease include diabetes mellitus, dyslipidemia, hypertension, post-menopausal and sedentary lifestyle. She participates in exercise intermittently. Her breakfast blood glucose is taken between 8-9 am. Her breakfast blood glucose range is generally 70-90 mg/dl. An ACE inhibitor/angiotensin II receptor blocker is being taken. Eye exam is current.  Hypertension This is a chronic problem. The current episode started more than 1 year ago. Condition status: FAIR CONTROL. Pertinent negatives include no anxiety, blurred vision, chest pain or headaches. The current treatment provides moderate improvement.    Past Medical History:  Diagnosis Date   Diabetes mellitus without complication ([place])    Hypertension    Substance abuse ([place])      Family History  Problem  Relation Age of Onset   Cancer Mother    Diabetes Father    Cancer Brother    Breast cancer Maternal Aunt      Current Outpatient Medications:    ACCU-CHEK FASTCLIX LANCETS MISC, USE TO CHECK BLOOD SUGARS TWICE DAILY, Disp: 100 each, Rfl: 11   ACCU-CHEK GUIDE test strip, USE TO CHECK BLOOD SUGARS TWICE DAILY AS DIRECTED, Disp: 100 strip, Rfl: 2   aspirin EC 81 MG tablet, Take 1 tablet (81 mg total) by mouth daily., Disp: 90 tablet, Rfl: 1   B-D ULTRAFINE III SHORT PEN 31G X 8 MM MISC, USE AS DIRECTED TWICE DAILY, Disp: 100 each, Rfl: 11   Biotin 10 MG CAPS, Take 1 tablet by mouth daily., Disp: 90 capsule, Rfl: 1   Blood Glucose Monitoring Suppl (ACCU-CHEK GUIDE ME) w/Device KIT, USE TO CHECK BLOOD GLUCOSE TWICE DAILY, Disp: 3 kit, Rfl: 3   calcium carbonate (OSCAL) 1500 (600 Ca) MG TABS tablet, Take 1 tablet (1,500 mg total) by mouth daily with breakfast., Disp: 90 tablet, Rfl: 1   carbidopa-levodopa (SINEMET IR) 25-100 MG tablet, Take 1 tablet by mouth 3 (three) times daily. Follow titration instructions provided separately in writing., Disp: 90 tablet, Rfl: 2   Cholecalciferol 125 MCG (5000 UT) TABS, Take 1 tablet (5,000 Units total) by mouth daily., Disp: 90 tablet, Rfl: 1   diclofenac Sodium (VOLTAREN) 1 % GEL, APPLY 2 GRAMS TOPICALLY TO THE AFFECTED AREA FOUR TIMES DAILY, Disp: 100 g, Rfl: 2   fluticasone (FLONASE) 50 MCG/ACT nasal spray, SHAKE LIQUID AND USE 2 SPRAYS IN EACH NOSTRIL DAILY, Disp: 16 g, Rfl: 2  loratadine (CLARITIN) 10 MG tablet, Take 1 tablet by mouth daily, Disp: 90 tablet, Rfl: 1   metFORMIN (GLUCOPHAGE) 500 MG tablet, Take 1 tablet (500 mg total) by mouth daily with breakfast. Dinner, Disp: 180 tablet, Rfl: 1   Multiple Vitamins-Minerals (ONE-A-DAY WOMENS 50+ ADVANTAGE) TABS, Take 1 tablet by mouth daily, Disp: 90 tablet, Rfl: 1   NON FORMULARY, 2 each. Calm, Disp: , Rfl:    Omega-3 Fatty Acids (FISH OIL) 1000 MG CAPS, Take 1 capsule by mouth daily, Disp: 90 capsule,  Rfl: 1   omeprazole (PRILOSEC) 40 MG capsule, TAKE 1 CAPSULE BY MOUTH EVERY DAY BEFORE A MEAL, Disp: 90 capsule, Rfl: 1   pioglitazone (ACTOS) 15 MG tablet, Take 1 tablet (15 mg total) by mouth daily., Disp: 90 tablet, Rfl: 1   Pitavastatin Calcium (LIVALO) 4 MG TABS, Take 1 tablet (4 mg total) by mouth daily., Disp: 90 tablet, Rfl: 1   telmisartan-hydrochlorothiazide (MICARDIS HCT) 40-12.5 MG tablet, Take 1 tablet by mouth daily., Disp: 90 tablet, Rfl: 1   Turmeric 500 MG CAPS, Take 1 capsule by mouth 2 (two) times daily., Disp: 180 capsule, Rfl: 1   vitamin C (ASCORBIC ACID) 500 MG tablet, Take 1 tablet (500 mg total) by mouth daily., Disp: 90 tablet, Rfl: 1   XULTOPHY 100-3.6 UNIT-MG/ML SOPN, INJECT 25 UNITS SUBCUTANEOUSLY DAILY, Disp: 27 mL, Rfl: 3   No Known Allergies   Review of Systems  Constitutional: Negative.   Eyes:  Negative for blurred vision.  Respiratory: Negative.    Cardiovascular: Negative.  Negative for chest pain.  Neurological: Negative.  Negative for headaches.  Psychiatric/Behavioral: Negative.      Today's Vitals   02/16/21 1559  BP: 136/78  Pulse: 74  Temp: 98.5 F (36.9 C)  Weight: 173 lb 6.4 oz (78.7 kg)  Height: 5' 1" (1.549 m)   Body mass index is 32.76 kg/m.  Wt Readings from Last 3 Encounters:  02/16/21 173 lb 6.4 oz (78.7 kg)  01/14/21 171 lb 3.2 oz (77.7 kg)  12/24/20 171 lb 9.6 oz (77.8 kg)    BP Readings from Last 3 Encounters:  02/16/21 136/78  01/20/21 124/68  01/14/21 130/82    Objective:  Physical Exam Vitals and nursing note reviewed. Exam conducted with a chaperone present.  Constitutional:      Appearance: Normal appearance.  HENT:     Head: Normocephalic and atraumatic.  Cardiovascular:     Rate and Rhythm: Normal rate and regular rhythm.     Heart sounds: Normal heart sounds.  Pulmonary:     Effort: Pulmonary effort is normal.     Breath sounds: Normal breath sounds.  Abdominal:     Hernia: There is no hernia in the  left inguinal area or right inguinal area.  Genitourinary:    General: Normal vulva.     Exam position: Lithotomy position.     Tanner stage (genital): 5.     Labia:        Right: No rash.        Left: No rash.      Vagina: Normal.     Cervix: Normal.     Uterus: Normal.      Comments: Stenotic cervix Lymphadenopathy:     Lower Body: No right inguinal adenopathy. No left inguinal adenopathy.  Skin:    General: Skin is warm.  Neurological:     General: No focal deficit present.     Mental Status: She is alert.  Psychiatric:  Mood and Affect: Mood normal.        Behavior: Behavior normal.        Assessment And Plan:     1. Diabetes mellitus with stage 2 chronic kidney disease ([place]) Comments: Chronic, I will check labs as listed below. Encouraged to follow dietary guidelines.  - Hemoglobin A1c - CMP14+EGFR  2. Hypertensive nephropathy Comments: Chronic, fair control. Goal BP <130/80. NO med change today. Encouraged to follow low sodium diet.   3. Class 1 obesity due to excess calories with serious comorbidity and body mass index (BMI) of 32.0 to 32.9 in adult Comments: She is encouraged to strive for BMI less than 30 to decrease cardiac risk.   4. Cervical smear, as part of routine gynecological examination Comments: Pap smear performed. stool is heme negative.  - Cytology -Pap Smear - POC Hemoccult Bld/Stl (1-Cd Office Dx)  5. History of tobacco use - CT CHEST LUNG CA SCREEN LOW DOSE W/O CM; Future  Patient was given opportunity to ask questions. Patient verbalized understanding of the plan and was able to repeat key elements of the plan. All questions were answered to their satisfaction.   I, Maximino Greenland, MD, have reviewed all documentation for this visit. The documentation on 02/16/21 for the exam, diagnosis, procedures, and orders are all accurate and complete.   IF YOU HAVE BEEN REFERRED TO A SPECIALIST, IT MAY TAKE 1-2 WEEKS TO SCHEDULE/PROCESS THE  REFERRAL. IF YOU HAVE NOT HEARD FROM US/SPECIALIST IN TWO WEEKS, PLEASE GIVE [place] A CALL AT [phone].   THE PATIENT IS ENCOURAGED TO PRACTICE SOCIAL DISTANCING DUE TO THE COVID-19 PANDEMIC.

## 2021-02-17 ENCOUNTER — Telehealth: Payer: Self-pay | Admitting: *Deleted

## 2021-02-17 ENCOUNTER — Ambulatory Visit: Payer: Medicare Other | Admitting: Occupational Therapy

## 2021-02-17 ENCOUNTER — Ambulatory Visit: Payer: Medicare Other

## 2021-02-17 ENCOUNTER — Encounter: Payer: Self-pay | Admitting: Internal Medicine

## 2021-02-17 DIAGNOSIS — R2689 Other abnormalities of gait and mobility: Secondary | ICD-10-CM

## 2021-02-17 DIAGNOSIS — M25621 Stiffness of right elbow, not elsewhere classified: Secondary | ICD-10-CM | POA: Diagnosis not present

## 2021-02-17 DIAGNOSIS — R293 Abnormal posture: Secondary | ICD-10-CM | POA: Diagnosis not present

## 2021-02-17 DIAGNOSIS — M25622 Stiffness of left elbow, not elsewhere classified: Secondary | ICD-10-CM | POA: Diagnosis not present

## 2021-02-17 DIAGNOSIS — M6281 Muscle weakness (generalized): Secondary | ICD-10-CM

## 2021-02-17 DIAGNOSIS — M25611 Stiffness of right shoulder, not elsewhere classified: Secondary | ICD-10-CM

## 2021-02-17 DIAGNOSIS — R278 Other lack of coordination: Secondary | ICD-10-CM | POA: Diagnosis not present

## 2021-02-17 DIAGNOSIS — R2681 Unsteadiness on feet: Secondary | ICD-10-CM

## 2021-02-17 DIAGNOSIS — M25612 Stiffness of left shoulder, not elsewhere classified: Secondary | ICD-10-CM

## 2021-02-17 DIAGNOSIS — R29818 Other symptoms and signs involving the nervous system: Secondary | ICD-10-CM | POA: Diagnosis not present

## 2021-02-17 DIAGNOSIS — R41841 Cognitive communication deficit: Secondary | ICD-10-CM

## 2021-02-17 DIAGNOSIS — R471 Dysarthria and anarthria: Secondary | ICD-10-CM | POA: Diagnosis not present

## 2021-02-17 LAB — CMP14+EGFR
ALT: 6 IU/L (ref 0–32)
AST: 16 IU/L (ref 0–40)
Albumin/Globulin Ratio: 1.5 (ref 1.2–2.2)
Albumin: 4.4 g/dL (ref 3.8–4.8)
Alkaline Phosphatase: 149 IU/L — ABNORMAL HIGH (ref 44–121)
BUN/Creatinine Ratio: 22 (ref 12–28)
BUN: 21 mg/dL (ref 8–27)
Bilirubin Total: <0.2 mg/dL (ref 0.0–1.2)
CO2: 25 mmol/L (ref 20–29)
Calcium: 10.1 mg/dL (ref 8.7–10.3)
Chloride: 102 mmol/L (ref 96–106)
Creatinine, Ser: 0.96 mg/dL (ref 0.57–1.00)
Globulin, Total: 2.9 g/dL (ref 1.5–4.5)
Glucose: 83 mg/dL (ref 70–99)
Potassium: 4.7 mmol/L (ref 3.5–5.2)
Sodium: 140 mmol/L (ref 134–144)
Total Protein: 7.3 g/dL (ref 6.0–8.5)
eGFR: 64 mL/min/{1.73_m2} (ref >59)

## 2021-02-17 LAB — POC HEMOCCULT BLD/STL (OFFICE/1-CARD/DIAGNOSTIC): Fecal Occult Blood, POC: NEGATIVE

## 2021-02-17 LAB — HEMOGLOBIN A1C
Est. average glucose Bld gHb Est-mCnc: 148 mg/dL
Hgb A1c MFr Bld: 6.8 % — ABNORMAL HIGH (ref 4.8–5.6)

## 2021-02-17 NOTE — Progress Notes (Signed)
Your hba1c has improved, down to 6.8. Your liver/kidney function are stable.

## 2021-02-17 NOTE — Chronic Care Management (AMB) (Signed)
  Care Management   Note  02/17/2021 Name: April Larson MRN: [id number] DOB: February 14, 1951  April Larson is a 70 y.o. year old female who is a primary care patient of Glendale Chard, MD and is actively engaged with the care management team. I reached out to April Larson by phone today to assist with re-scheduling a follow up visit with the RN Case Manager  Follow up plan: Unsuccessful telephone outreach attempt made. A HIPAA compliant phone message was left for the patient providing contact information and requesting a return call.  The care management team will reach out to the patient again over the next 7 days.  If patient returns call to provider office, please advise to call [place] at [phone].  [facility] Management  Direct Dial: [phone]

## 2021-02-17 NOTE — Therapy (Signed)
[facility] [address] [place], [place], [address] Phone: [phone]   Fax:  [phone]  Physical Therapy Treatment  Patient Details  Name: April Larson MRN: [id number] Date of Birth: [birth year] Referring Provider (PT): [name], MD   Encounter Date: 02/17/2021   PT End of Session - 02/17/21 1500     Visit Number 9    Number of Visits 17    Date for PT Re-Evaluation 04/09/21    Authorization Type [employer] Medicare - needs 10th visit PN    PT Start Time 1400    PT Stop Time 1445    PT Time Calculation (min) 45 min    Equipment Utilized During Treatment Gait belt    Activity Tolerance Patient tolerated treatment well    Behavior During Therapy WFL for tasks assessed/performed             Past Medical History:  Diagnosis Date   Diabetes mellitus without complication ([place])    Hypertension    Substance abuse ([place])     Past Surgical History:  Procedure Laterality Date   EYE SURGERY      There were no vitals filed for this visit.   Subjective Assessment - 02/17/21 1403     Subjective Pt states she has been doing good since the last visit. Pt states her walking program has been going well and that she even walked for 5 minutes around the gym.    Patient is accompained by: Family member   Sister, [name]   Pertinent History PMH significant for hypertension, diabetes, reflux disease, allergies, sleep apnea and borderline obesity, hx of rib fx s/p fall    Limitations Walking;House hold activities    Diagnostic tests CT shows generalized atrophy and white matter disease , DaTSCAN results are suggestive of Parkinsonian syndromes.    Patient Stated Goals Wants to work on walking/balance.    Currently in Pain? No/denies                               [facility] Adult PT Treatment/Exercise - 02/17/21 1408       Transfers   Transfers Sit to Stand;Stand to Sit    Sit to Stand 5:  Supervision    Stand to Sit 5: Supervision      Ambulation/Gait   Ambulation/Gait Yes    Ambulation/Gait Assistance 5: Supervision    Ambulation Distance (Feet) 460 Feet    Assistive device None    Gait Pattern Step-through pattern    Ambulation Surface Level;Indoor      Neuro Re-ed    Neuro Re-ed Details  Pt performed standing PWR! exercises at listed below. Pt performed 2 sets of weight shift to 4 cone pick up each BUE. PT provided min guard and provided verbal cues to maintain an upright posture and to reset posture after each reach. Pt performed 2x10 each BLE of lateral step outs while standing on airex foam w/ emphasis on big quick movements. Pt did not require UE assist and only needed min guard from PT. PT provided verbal cues to maintain tall posture. Pt performed 2x10 each BLE backwards steps while standing on airex. Pt stated increased "wobbly feeeling" in legs due to the airex but did not require UE assist. PT provided min guard and verbal cues for technique. Pt did x10 vertical head nods while standing on airex w/ EO/EC. Pt demonstrated increased sway with EC and had  to use UE assist at times to catch balance. PT provided min guard.      Exercises   Exercises Other Exercises    Other Exercises  Pt performed 3 laps of 4 of cone taps w/ marches. Pt only lost balance twice but caught herself on counter. PT provided min guard and verbal cues for big steps.             Pt performs PWR! Moves in  standing position:    PWR! Up for improved posture x10 reps - cues for R elbow extension and for proper mini squat position.    PWR! Rock for improved weighshifting x10 reps reaching with cues to look up to hands   PWR! Twist for improved trunk rotation x10 reps, cues to reset in the middle and to look at hands when twisting.    PWR! Step for improved step initiation x10 reps with cues for incr foot clearance and to look at hands when reaching.  Pt slightly stumbled on last two  repetitions but was able to catch herself and regain balance. PT cued her to slow down and focus on picking up feet to avoid this in the future.   Pt reported 10/10 fatigue after these exercises.          PT Short Term Goals - 02/05/21 1548       PT SHORT TERM GOAL #1   Title Pt will be independent with initial HEP in order to build upon functional gains made in threrapy. ALL STGS DUE 02/06/21    Baseline Pt still needs some cuing with HEP. Sister has been helping her but pt has not been performing all exercises. They are going to also start going to gym to walk on track.    Time 4    Period Weeks    Status Partially Met    Target Date 02/06/21      PT SHORT TERM GOAL #2   Title Pt will decr 5x sit <> stand with no UE support to 18.5 seconds or less in order to decr fall risk/improve functional BLE strength.    Baseline 21.50 seconds. 02/05/21 19.35 sec    Time 4    Period Weeks    Status Partially Met      PT SHORT TERM GOAL #3   Title Pt will decr manual TUG to 13 seconds or less in order to demo decr fall risk.    Baseline 14.75 seconds; 13.97 seconds on 02/02/21    Time 4    Period Weeks    Status Not Met      PT SHORT TERM GOAL #4   Title Pt and pt's family will verbalize understanding of fall prevention in the home.    Baseline handout provided on 02/02/21    Time 4    Period Weeks    Status Achieved               PT Long Term Goals - 01/21/21 6945       PT LONG TERM GOAL #1   Title Pt will be independent with final HEP in order to build upon functional gains made in threrapy. ALL LTGS DUE 03/06/21    Time 8    Period Weeks    Status New      PT LONG TERM GOAL #2   Title Pt will decr 5x sit <> stand with no UE support to 16.5 seconds or less in order to decr fall risk/improve functional BLE strength.  Baseline 21.5 seconds    Time 8    Period Weeks    Status New      PT LONG TERM GOAL #3   Title Pt will verbalize understanding of local  Parkinson's disease resources.    Time 8    Period Weeks    Status New      PT LONG TERM GOAL #4   Title Pt will improve miniBEST score to at least a 24/28 in order to demo decr fall risk.    Baseline 21/28    Time 8    Period Weeks    Status Revised      PT LONG TERM GOAL #5   Title Pt will recover posterior balance in push and release test in 1 step independently, for improved balance recovery    Baseline 2 small steps    Time 8    Period Weeks    Status New                   Plan - 02/17/21 1501     Clinical Impression Statement Today's PT session focused on balance and big movements laterally and backwards. Pt presents w/ increased arm swing during gait but had two episodes where her L foot caught the floor. Pt still presents w/ deficits movements laterally specifcally to the L demonstrated w/ slight loss of balance w/ the PWR! step exercise. PT will continue to progress PT as tolerated w/ activities to facilitate pt achievement of LTG.    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 PMH significant for hypertension, diabetes, reflux disease, sleep apnea and borderline obesity, hx of rib fx s/p fall.    Examination-Activity Limitations Transfers;Stairs;Locomotion Level    Examination-Participation Restrictions Community Activity;Driving;Occupation    Stability/Clinical Decision Making Evolving/Moderate complexity    Rehab Potential Good    PT Frequency 2x / week    PT Duration 12 weeks    PT Treatment/Interventions ADLs/Self Care Home Management;DME Instruction;Therapeutic activities;Functional mobility training;Stair training;Gait training;Therapeutic exercise;Balance training;Neuromuscular re-education;Patient/family education;Vestibular    PT Next Visit Plan 10th visit progress note. [employer] for aerobic activity. Gait training with incr  effort/ arm swing.balance on unlevel surfaces. lateral/posterior stepping strategies. SLS tasks. Having pt scan with eyes/head during balance tasks.    Consulted and Agree with Plan of Care Patient;Family member/caregiver    Family Member Consulted pt's brother sebastian             Patient will benefit from skilled therapeutic intervention in order to improve the following deficits and impairments:  Abnormal gait, Decreased activity tolerance, Decreased coordination, Decreased strength, Impaired tone, Postural dysfunction, Difficulty walking, Decreased balance, Decreased safety awareness  Visit Diagnosis: Other abnormalities of gait and mobility  Abnormal posture  Unsteadiness on feet  Other symptoms and signs involving the nervous system  Muscle weakness (generalized)     Problem List Patient Active Problem List   Diagnosis Date Noted   Decreased dorsalis pedis pulse 11/17/2020   Tachycardia 08/11/2020   Cough 08/11/2020   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   Pure hypercholesterolemia 01/23/2020   Vitamin D deficiency 10/23/2019   Right hip pain 05/15/2018   Estrogen deficiency 05/15/2018   Class 1 obesity due to excess calories with serious comorbidity and body mass index (BMI) of 31.0 to 31.9 in adult 05/15/2018   Diabetes mellitus with stage 2 chronic kidney disease ([place]) 01/11/2018   Benign hypertension with chronic kidney disease, stage II  01/11/2018    Lottie Mussel, Student-PT 02/17/2021, 4:07 PM  [facility] [address] [place], [place], [address] Phone: [phone]   Fax:  [phone]  Name: Sherese Vilikia Watkins Larson MRN: [id number] Date of Birth: 1950/04/08

## 2021-02-18 ENCOUNTER — Ambulatory Visit: Payer: Medicare Other

## 2021-02-18 ENCOUNTER — Ambulatory Visit: Payer: Medicare Other | Admitting: Occupational Therapy

## 2021-02-18 ENCOUNTER — Other Ambulatory Visit: Payer: Self-pay

## 2021-02-18 DIAGNOSIS — R293 Abnormal posture: Secondary | ICD-10-CM

## 2021-02-18 DIAGNOSIS — R2681 Unsteadiness on feet: Secondary | ICD-10-CM

## 2021-02-18 DIAGNOSIS — M6281 Muscle weakness (generalized): Secondary | ICD-10-CM

## 2021-02-18 DIAGNOSIS — R29818 Other symptoms and signs involving the nervous system: Secondary | ICD-10-CM | POA: Diagnosis not present

## 2021-02-18 DIAGNOSIS — R471 Dysarthria and anarthria: Secondary | ICD-10-CM | POA: Diagnosis not present

## 2021-02-18 DIAGNOSIS — M25621 Stiffness of right elbow, not elsewhere classified: Secondary | ICD-10-CM

## 2021-02-18 DIAGNOSIS — R2689 Other abnormalities of gait and mobility: Secondary | ICD-10-CM

## 2021-02-18 DIAGNOSIS — M25612 Stiffness of left shoulder, not elsewhere classified: Secondary | ICD-10-CM | POA: Diagnosis not present

## 2021-02-18 DIAGNOSIS — R278 Other lack of coordination: Secondary | ICD-10-CM

## 2021-02-18 DIAGNOSIS — M25622 Stiffness of left elbow, not elsewhere classified: Secondary | ICD-10-CM | POA: Diagnosis not present

## 2021-02-18 DIAGNOSIS — M25611 Stiffness of right shoulder, not elsewhere classified: Secondary | ICD-10-CM

## 2021-02-18 DIAGNOSIS — R41841 Cognitive communication deficit: Secondary | ICD-10-CM

## 2021-02-18 NOTE — Therapy (Signed)
[facility] [address] [place], [place], [address] Phone: [phone]   Fax:  [phone]  Occupational Therapy Treatment  Patient Details  Name: April Larson MRN: [id number] Date of Birth: [birth year] Referring Provider (OT): Dr. Rexene Alberts   Encounter Date: 02/17/2021   OT End of Session - 02/17/21 1506     Visit Number 8    Number of Visits 25    Date for OT Re-Evaluation 03/31/21    Authorization - Visit Number 8    Authorization - Number of Visits 10    Progress Note Due on Visit 10    OT Start Time 1446    OT Stop Time 1528    OT Time Calculation (min) 42 min             Past Medical History:  Diagnosis Date   Diabetes mellitus without complication ([place])    Hypertension    Substance abuse ([place])     Past Surgical History:  Procedure Laterality Date   EYE SURGERY      There were no vitals filed for this visit.   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 PMH significant for hypertension, diabetes, reflux disease, allergies, sleep apnea and borderline obesity, hx of rib fx s/p fall,    Patient Stated Goals maintain independence    Currently in Pain? No/denies                  Treatment: Standing at countertop pt practiced stepping forward with reaching overhead, maintaining feet apart and staggered stance for improved balance. Copying small peg design with left and right UE's for increase fine motor coordination, min difficulty for design/v.c Handwriting activities with improved legibility and letter size, pt printed 3 sentences with good legibility following cueing.                OT Education - 02/18/21 1642     Education Details Reveiwed PWR! moves basic 4 in seated, 10 reps each, min v.c    Person(s)  Educated Patient;Other (comment)    Comprehension Verbalized understanding;Returned demonstration;Verbal cues required              OT Short Term Goals - 02/18/21 1514       OT SHORT TERM GOAL #1   Title I with PD specific HEP    Status Achieved      OT SHORT TERM GOAL #2   Title Pt will verbalize understanding of adapted strategies to maximize safety and I with ADLs/ IADLs (ie: cutting food, fasten buttons, writing)    Status On-going      OT SHORT TERM GOAL #3   Title Pt will verbalize understanding of compensatory strategies  for visual deficits.    Status On-going      OT SHORT TERM GOAL #4   Title Pt will demonstrate improved fine motor coordination for ADLs as evidenced by decreasing 9 hole peg test score for RUE by 3 secs    Status On-going   43.85, 33.82partially met, not yet consistent     OT SHORT TERM GOAL #5   Title assess PPT#4 don/ doff jacket and set goal prn    Status Deferred   13.72, pt in structed in adapted techniques after demo     OT SHORT TERM GOAL #6  Title Pt will demonstrate understanding of memory compensations and ways to keep thinking skills sharp    Status Achieved   issued 02/10/21              OT Long Term Goals - 02/18/21 1515       OT LONG TERM GOAL #1   Title Pt will write a short paragraph with 100% legibility and  minimal decrease in letter size    Time 12    Period Weeks    Status On-going      OT LONG TERM GOAL #2   Title Pt will demonstrate ability to retrieve a lightweight object at 115 shoulder flexion and -15 elbow extension with RUE    Time 12    Period Weeks    Status On-going      OT LONG TERM GOAL #3   Title Pt will demonstrate ability to retrieve a lightweight object at 120 shoulder flexion and -15 elbow extension with LUE    Time 12    Period Weeks    Status On-going      OT LONG TERM GOAL #4   Title Pt will demonstrate improved ease with feeding as evidenced by decreasing PPT#2 by 3 secs    Time 12     Period Weeks    Status On-going      OT LONG TERM GOAL #5   Title Pt will demonstrate improved ease with fastening buttons as evidenced by decreasing 3 button/ unbutton time to 70 secs or less    Baseline 83 secs    Time 12    Period Weeks    Status On-going      OT LONG TERM GOAL #6   Title Pt will demonstrate improved bilateral UE functional use for ADLs as evidenced by increasing box/ blocks score by 4 blocks with right and left UE's    Baseline R 41, L 39    Time 12    Period Weeks    Status On-going                   Plan - 02/18/21 1643     Clinical Impression Statement Pt is progressing towards goals. she demonstrates improved handwriting with practice.    OT Occupational Profile and History Detailed Assessment- Review of Records and additional review of physical, cognitive, psychosocial history related to current functional performance    Occupational performance deficits (Please refer to evaluation for details): ADL's;IADL's;Leisure;Social Participation;Play;Work    Marketing executive / Function / Physical Skills ADL;Endurance;UE functional use;Balance;Vision;Flexibility;FMC;ROM;Gait;Coordination;GMC;Sensation;Decreased knowledge of precautions;Decreased knowledge of use of DME;IADL;Strength;Dexterity;Mobility;Tone    Cognitive Skills Memory;Problem Solve;Safety Awareness;Thought    Rehab Potential Good    Clinical Decision Making Limited treatment options, no task modification necessary    Comorbidities Affecting Occupational Performance: May have comorbidities impacting occupational performance    Modification or Assistance to Complete Evaluation  No modification of tasks or assist necessary to complete eval    OT Frequency 2x / week   plus eval, anticipate d/c following 8 weeks dependent upon pt progress.   OT Duration 12 weeks    OT Treatment/Interventions Self-care/ADL training;Energy conservation;Visual/perceptual remediation/compensation;Patient/family  education;DME and/or AE instruction;Gait Training;Passive range of motion;Balance training;Fluidtherapy;Cryotherapy;Electrical Stimulation;Functional Mobility Training;Splinting;Therapeutic activities;Manual Therapy;Therapeutic exercise;Moist Heat;Neuromuscular education;Cognitive remediation/compensation    Plan reinforce ADL strategies, fine motor coordination, work towards goals.    Consulted and Agree with Plan of Care Patient             Patient will benefit from skilled  therapeutic intervention in order to improve the following deficits and impairments:   Body Structure / Function / Physical Skills: ADL, Endurance, UE functional use, Balance, Vision, Flexibility, FMC, ROM, Gait, Coordination, GMC, Sensation, Decreased knowledge of precautions, Decreased knowledge of use of DME, IADL, Strength, Dexterity, Mobility, Tone Cognitive Skills: Memory, Problem Solve, Safety Awareness, Thought     Visit Diagnosis: Abnormal posture  Unsteadiness on feet  Other symptoms and signs involving the nervous system  Muscle weakness (generalized)  Other lack of coordination  Stiffness of right elbow, not elsewhere classified  Stiffness of right shoulder, not elsewhere classified  Stiffness of left shoulder, not elsewhere classified    Problem List Patient Active Problem List   Diagnosis Date Noted   Decreased dorsalis pedis pulse 11/17/2020   Tachycardia 08/11/2020   Cough 08/11/2020   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   Pure hypercholesterolemia 01/23/2020   Vitamin D deficiency 10/23/2019   Right hip pain 05/15/2018   Estrogen deficiency 05/15/2018   Class 1 obesity due to excess calories with serious comorbidity and body mass index (BMI) of 31.0 to 31.9 in adult 05/15/2018   Diabetes mellitus with stage 2 chronic kidney disease ([place]) 01/11/2018   Benign hypertension with chronic kidney disease, stage II 01/11/2018    Maylin Freeburg, OT/L 02/18/2021, 4:43 PM  [facility] [address] [place], [place], [address] Phone: [phone]   Fax:  [phone]  Name: April Larson MRN: [id number] Date of Birth: 1951-02-06

## 2021-02-18 NOTE — Therapy (Signed)
[facility] [address] [place], [place], [address] Phone: [phone]   Fax:  [phone]  Physical Therapy Treatment/Progress note  Patient Details  Name: April Larson MRN: [id number] Date of Birth: [birth year] Referring Provider (PT): [name], MD    Progress Note  Reporting period 01/09/21 to 02/18/21  See Note below for Objective Data and Assessment of Progress/Goals   Encounter Date: 02/18/2021   PT End of Session - 02/18/21 1618     Visit Number 10    Number of Visits 17    Date for PT Re-Evaluation 04/09/21    Authorization Type UHC Medicare - needs 10th visit PN    PT Start Time 1530    PT Stop Time 1613    PT Time Calculation (min) 43 min    Equipment Utilized During Treatment Gait belt    Activity Tolerance Patient tolerated treatment well    Behavior During Therapy WFL for tasks assessed/performed             Past Medical History:  Diagnosis Date   Diabetes mellitus without complication ([place])    Hypertension    Substance abuse ([place])     Past Surgical History:  Procedure Laterality Date   EYE SURGERY      There were no vitals filed for this visit.   Subjective Assessment - 02/18/21 1532     Subjective Pt states she felt tired after last visit but does not feel any pain. Pt reports she walked 5 laps today which is up from 3 this weekend.    Patient is accompained by: Family member   Sister, [name]   Pertinent History PMH significant for hypertension, diabetes, reflux disease, allergies, sleep apnea and borderline obesity, hx of rib fx s/p fall    Limitations Walking;House hold activities    Diagnostic tests CT shows generalized atrophy and white matter disease , DaTSCAN results are suggestive of Parkinsonian syndromes.    Patient Stated Goals Wants to work on walking/balance.    Currently in Pain? No/denies                               [facility]  Adult PT Treatment/Exercise - 02/18/21 1533       Transfers   Transfers Sit to Stand;Stand to Sit    Sit to Stand 5: Supervision    Stand to Sit 5: Supervision      Ambulation/Gait   Ambulation/Gait Yes    Ambulation/Gait Assistance 5: Supervision    Ambulation Distance (Feet) 460 Feet    Assistive device None    Gait Pattern Step-through pattern    Ambulation Surface Level;Indoor      Neuro Re-ed    Neuro Re-ed Details  Pt performed standing PWR! exercises at listed below. At counter pt performed 3x8 each BLE of backwards steps while lifting purple ball overhead to focus on posterior stepping strategy. Pt demonstrated increased difficulty while stepping back with LLE. PT provided min guard and verbal cues to tilt head back to look all the way up. Pt performed lateral steps over tall hurdle while standing on airex at counter to focus on lateral stepping strategy. Pt required min use of hands and had increased difficulty while stepping out with RLE. PT provided min guard and verbal cues for technique.      Knee/Hip Exercises: Aerobic   Stepper SciFit at level 2.0 for 6 minutes for ROM, aerobic activity,  reciprocal movement. Educated on importance of aerobic activity with PD. Pt keeping spm >80. Pt reported RPE 7-8/10 at end.               Pt performs PWR! Moves in  standing position:    PWR! Up for improved posture x10 reps - cues for R elbow extension and for proper mini squat position.    PWR! Rock for improved weighshifting x10 reps reaching with cues to look up to hands   PWR! Twist for improved trunk rotation x10 reps, cues to reset in the middle and to look at hands when twisting.    PWR! Step for improved step initiation x10 reps with cues for incr foot clearance and to look at hands when reaching.    Pt reported 8/10 fatigue after these exercises.          PT Short Term Goals - 02/05/21 1548       PT SHORT TERM GOAL #1   Title Pt will be independent with  initial HEP in order to build upon functional gains made in threrapy. ALL STGS DUE 02/06/21    Baseline Pt still needs some cuing with HEP. Sister has been helping her but pt has not been performing all exercises. They are going to also start going to gym to walk on track.    Time 4    Period Weeks    Status Partially Met    Target Date 02/06/21      PT SHORT TERM GOAL #2   Title Pt will decr 5x sit <> stand with no UE support to 18.5 seconds or less in order to decr fall risk/improve functional BLE strength.    Baseline 21.50 seconds. 02/05/21 19.35 sec    Time 4    Period Weeks    Status Partially Met      PT SHORT TERM GOAL #3   Title Pt will decr manual TUG to 13 seconds or less in order to demo decr fall risk.    Baseline 14.75 seconds; 13.97 seconds on 02/02/21    Time 4    Period Weeks    Status Not Met      PT SHORT TERM GOAL #4   Title Pt and pt's family will verbalize understanding of fall prevention in the home.    Baseline handout provided on 02/02/21    Time 4    Period Weeks    Status Achieved               PT Long Term Goals - 01/21/21 2774       PT LONG TERM GOAL #1   Title Pt will be independent with final HEP in order to build upon functional gains made in threrapy. ALL LTGS DUE 03/06/21    Time 8    Period Weeks    Status New      PT LONG TERM GOAL #2   Title Pt will decr 5x sit <> stand with no UE support to 16.5 seconds or less in order to decr fall risk/improve functional BLE strength.    Baseline 21.5 seconds    Time 8    Period Weeks    Status New      PT LONG TERM GOAL #3   Title Pt will verbalize understanding of local Parkinson's disease resources.    Time 8    Period Weeks    Status New      PT LONG TERM GOAL #4   Title Pt will  improve miniBEST score to at least a 24/28 in order to demo decr fall risk.    Baseline 21/28    Time 8    Period Weeks    Status Revised      PT LONG TERM GOAL #5   Title Pt will recover posterior  balance in push and release test in 1 step independently, for improved balance recovery    Baseline 2 small steps    Time 8    Period Weeks    Status New                   Plan - 02/18/21 1619     Clinical Impression Statement Today's session focused on posterior and lateral stepping strategies as well as focusing on big movements. Pt presents w/ impoved balance as she is able to tolerate activities on compliant surfaces w/ less loss of balance episodes. Pt shows improved endurance as she is able to walk longer distances (about 6 minutes consecutively). Pt still demonstrates deficits in balance and activity tolerance as she still stumbles and requires some UE assistance w/ balance activities and requires multiple rest breaks per therapy session. Pt tolearted therapy well w/ appropriate fatigue and no adverse s/s at the conclusion of session. PT will continue to progress pt to address the deficits stated above and facilitate pt achievement of LTG.    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 PMH significant for hypertension, diabetes, reflux disease, sleep apnea and borderline obesity, hx of rib fx s/p fall.    Examination-Activity Limitations Transfers;Stairs;Locomotion Level    Examination-Participation Restrictions Community Activity;Driving;Occupation    Stability/Clinical Decision Making Evolving/Moderate complexity    Rehab Potential Good    PT Frequency 2x / week    PT Duration 12 weeks    PT Treatment/Interventions ADLs/Self Care Home Management;DME Instruction;Therapeutic activities;Functional mobility training;Stair training;Gait training;Therapeutic exercise;Balance training;Neuromuscular re-education;Patient/family education;Vestibular    PT Next Visit Plan SciFit for aerobic activity. Gait training with incr effort/ arm swing.balance on  unlevel surfaces. lateral/posterior stepping strategies. SLS tasks. Having pt scan with eyes/head during balance tasks.    Consulted and Agree with Plan of Care Patient;Family member/caregiver    Family Member Consulted pt's brother sebastian             Patient will benefit from skilled therapeutic intervention in order to improve the following deficits and impairments:  Abnormal gait, Decreased activity tolerance, Decreased coordination, Decreased strength, Impaired tone, Postural dysfunction, Difficulty walking, Decreased balance, Decreased safety awareness  Visit Diagnosis: Abnormal posture  Unsteadiness on feet  Other symptoms and signs involving the nervous system  Muscle weakness (generalized)  Other abnormalities of gait and mobility     Problem List Patient Active Problem List   Diagnosis Date Noted   Decreased dorsalis pedis pulse 11/17/2020   Tachycardia 08/11/2020   Cough 08/11/2020   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   Pure hypercholesterolemia 01/23/2020   Vitamin D deficiency 10/23/2019   Right hip pain 05/15/2018   Estrogen deficiency 05/15/2018   Class 1 obesity due to excess calories with serious comorbidity and body mass index (BMI) of 31.0 to 31.9 in adult 05/15/2018   Diabetes mellitus with stage 2 chronic kidney disease ([place]) 01/11/2018   Benign hypertension with chronic kidney disease, stage II 01/11/2018    Lottie Mussel, Student-PT 02/18/2021, 4:27 PM  [facility] [address] [place], [place], [address] Phone: [phone]  Fax:  [phone]  Name: April Larson MRN: [id number] Date of Birth: 1950-07-16

## 2021-02-18 NOTE — Patient Instructions (Signed)
Read the instructions 2-3 times before you begin.   If you don't understand the first time, ask them to repeat the information or re-read the information. You may need to ask a question to clarify if you understood or ask them to rephrase it in another way.

## 2021-02-18 NOTE — Therapy (Signed)
[facility] [address] [place], [place], [address] Phone: [phone]   Fax:  [phone]  Speech Language Pathology Treatment  Patient Details  Name: April Larson MRN: [id number] Date of Birth: 07-06-1950 Referring Provider (SLP): [name], MD   Encounter Date: 02/17/2021   End of Session - 02/17/21 1614     Visit Number 9    Number of Visits 25    Date for SLP Re-Evaluation 04/03/21    Authorization Type UHC Medicare    SLP Start Time 1534    SLP Stop Time  1614    SLP Time Calculation (min) 40 min    Activity Tolerance Patient tolerated treatment well             Past Medical History:  Diagnosis Date   Diabetes mellitus without complication ([place])    Hypertension    Substance abuse ([place])     Past Surgical History:  Procedure Laterality Date   EYE SURGERY      There were no vitals filed for this visit.   Subjective Assessment - 02/17/21 1613     Subjective "I did my homework"    Patient is accompained by: Family member   brother   Currently in Pain? No/denies                   ADULT SLP TREATMENT - 02/17/21 1546       General Information   Behavior/Cognition Alert;Cooperative;Pleasant mood;Requires cueing      Treatment Provided   Treatment provided Cognitive-Linquistic      Cognitive-Linquistic Treatment   Treatment focused on Cognition;Dysarthria    Skilled Treatment Pt returned with homework completed and double checked per patient, with 88% accuracy achieved. Occasional mod A required to correct errors. SLP reviewed dysarthria HEP. Pt reports previous SLP provided additional explaination of HEP, but pt unable to recall details as usual vague language demonstrated and consistent SLP prompting required. Pt would continue to benefit from repetition to aid recall re: dysarthria HEP and compensations. Loud /a/ averaged low 90s with consistent cues required to  coordinate breath support and phonation at top of breath. In conversation, pt averaged low 70s with intermittent non-verbal cues provided to maintain the volume. SLP educated pt and brother on establishing cue to remind patient to increase and maintain appropriate volume.      Assessment / Recommendations / Plan   Plan Continue with current plan of care      Progression Toward Goals   Progression toward goals Progressing toward goals              SLP Education - 02/17/21 1613     Education Details dysarthria HEP, family provide cues to assist with establish and maintain WNL volume    Person(s) Educated Patient;Caregiver(s)    Methods Explanation;Demonstration;Handout;Verbal cues    Comprehension Verbalized understanding;Returned demonstration;Need further instruction              SLP Short Term Goals - 02/10/21 1617       SLP SHORT TERM GOAL #1   Title Pt will utilize memory and attention compensations to optimize performance at work and home given occasional min A over 2 sessions    Baseline 02-05-21    Time 4    Period Weeks    Status Partially Met    Target Date 02/06/21      SLP SHORT TERM GOAL #2   Title Pt will identify and correct errors on structured  therapy tasks with 80% accuracy given occasional min A over 2 sessions    Time 4    Period Weeks    Status Not Met    Target Date 02/06/21      SLP SHORT TERM GOAL #3   Title Pt will complete dysarthria HEP given occasional min A over 3 sessions    Baseline 02-05-21    Time 4    Period Weeks    Status Partially Met    Target Date 02/06/21      SLP SHORT TERM GOAL #4   Title Pt will sustain loud /a/ with average of low 90s dB over 3 sessions    Time 4    Period Weeks    Status Not Met    Target Date 02/06/21      SLP SHORT TERM GOAL #5   Title Pt will maintain WNL conversational volume (70-72 dB) during 5 minute conversation given occasional min A over 2 sessions    Time 4    Period Weeks    Status Not  Met    Target Date 02/06/21              SLP Long Term Goals - 02/17/21 1614       SLP LONG TERM GOAL #1   Title Pt will utilize memory and attention compensations to optimize performance at work and home given rare min A over 2 sessions    Time 8    Period Weeks    Status On-going    Target Date 03/06/21      SLP LONG TERM GOAL #2   Title Pt will identify and correct errors on structured therapy tasks with 90% accuracy given rare min A over 2 sessions    Time 8    Period Weeks    Status On-going    Target Date 03/06/21      SLP LONG TERM GOAL #3   Title Pt will complete dysarthria HEP BID > 2 weeks    Time 12    Period Weeks    Status On-going    Target Date 04/03/21      SLP LONG TERM GOAL #4   Title Pt will maintain WNL conversational volume (70-72 dB) during 15+ minute conversation given rare min A over 2 sessions    Time 12    Period Weeks    Status On-going    Target Date 04/03/21              Plan - 02/17/21 1614     Clinical Impression Statement "April Larson" was referred for OPST to address dysarthria and 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 SLP conducted ongoing education and training of dysarthria HEP and compensations to optimize vocal intensity as inconsistent independent completion reported. Indirectly, SLP targeted recall, problem solving, and attention for structured tasks, with regular A necessary to aid recall and attention. Due to cognitive and speech decline impacting daily living tasks, skilled ST continues as warranted to optimize pt's functional independence, safety, and communication effectiveness.    Speech Therapy Frequency 2x / week    Duration 12 weeks    Treatment/Interventions Cognitive reorganization;Multimodal communcation approach;Language facilitation;Compensatory techniques;Internal/external aids;SLP instruction and feedback;Patient/family education;Functional tasks;Cueing  hierarchy;Compensatory strategies    Potential to Achieve Goals Good    Consulted and Agree with Plan of Care Patient             Patient will benefit from skilled therapeutic intervention in order to improve  the following deficits and impairments:   Dysarthria and anarthria  Cognitive communication deficit    Problem List Patient Active Problem List   Diagnosis Date Noted   Decreased dorsalis pedis pulse 11/17/2020   Tachycardia 08/11/2020   Cough 08/11/2020   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   Pure hypercholesterolemia 01/23/2020   Vitamin D deficiency 10/23/2019   Right hip pain 05/15/2018   Estrogen deficiency 05/15/2018   Class 1 obesity due to excess calories with serious comorbidity and body mass index (BMI) of 31.0 to 31.9 in adult 05/15/2018   Diabetes mellitus with stage 2 chronic kidney disease ([place]) 01/11/2018   Benign hypertension with chronic kidney disease, stage II 01/11/2018    Alinda Deem, MA CCC-SLP 02/18/2021, 8:59 AM  [facility] [address] [place], [place], [address] Phone: [phone]   Fax:  [phone]   Name: April Larson MRN: [id number] Date of Birth: 21-Sep-1950

## 2021-02-19 ENCOUNTER — Ambulatory Visit (INDEPENDENT_AMBULATORY_CARE_PROVIDER_SITE_OTHER): Payer: Medicare Other

## 2021-02-19 DIAGNOSIS — E1122 Type 2 diabetes mellitus with diabetic chronic kidney disease: Secondary | ICD-10-CM

## 2021-02-19 DIAGNOSIS — I129 Hypertensive chronic kidney disease with stage 1 through stage 4 chronic kidney disease, or unspecified chronic kidney disease: Secondary | ICD-10-CM

## 2021-02-19 NOTE — Progress Notes (Signed)
Chronic Care Management Pharmacy Note  02/24/2021 Name:  April Larson MRN:  [id number] DOB:  [birth year]  Summary: Patient reports that she is working trying her hardest to do things to stay healthy.   Recommendations/Changes made from today's visit: Recommend patients Actos be discontinued.   Plan: Patient to start taking Actos Monday, Wednesday and Friday until she is out of medication. Patient to monitor BS twice per day, patient to call us if her BS are 140-150 in the morning.    Subjective: April Larson is an 70 y.o. year old female who is a primary patient of Glendale Chard, MD.  The CCM team was consulted for assistance with disease management and care coordination needs.    Engaged with patient by telephone for follow up visit in response to provider referral for pharmacy case management and/or care coordination services.   Consent to Services:  The patient was given information about Chronic Care Management services, agreed to services, and gave verbal consent prior to initiation of services.  Please see initial visit note for detailed documentation.   Patient Care Team: Glendale Chard, MD as PCP - General (Internal Medicine) Warden Fillers, MD as Consulting Physician (Ophthalmology)  Recent office visits: 02/16/2021 PCP OV 01/14/2021 PCP OV   Recent consult visits: 12/24/2020 Neurology [facility] visits: None in previous 6 months   Objective:  Lab Results  Component Value Date   CREATININE 0.96 02/16/2021   BUN 21 02/16/2021   GFRNONAA 65 04/15/2020   GFRAA 75 04/15/2020   NA 140 02/16/2021   K 4.7 02/16/2021   CALCIUM 10.1 02/16/2021   CO2 25 02/16/2021   GLUCOSE 83 02/16/2021    Lab Results  Component Value Date/Time   HGBA1C 6.8 (H) 02/16/2021 05:06 PM   HGBA1C 7.0 (H) 11/10/2020 12:16 PM   MICROALBUR 10 11/10/2020 01:13 PM   MICROALBUR 10 10/23/2019 03:42 PM    Last diabetic Eye exam:  Lab Results   Component Value Date/Time   HMDIABEYEEXA No Retinopathy 12/02/2020 12:00 AM    Last diabetic Foot exam: No results found for: HMDIABFOOTEX   Lab Results  Component Value Date   CHOL 176 08/11/2020   HDL 53 08/11/2020   LDLCALC 63 08/11/2020   TRIG 390 (H) 08/11/2020   CHOLHDL 3.3 10/23/2019    Hepatic Function Latest Ref Rng & Units 02/16/2021 11/10/2020 07/23/2020  Total Protein 6.0 - 8.5 g/dL 7.3 CANCELED 7.6  Albumin 3.8 - 4.8 g/dL 4.4 CANCELED 4.5  AST 0 - 40 IU/L 16 CANCELED 15  ALT 0 - 32 IU/L 6 CANCELED 15  Alk Phosphatase 44 - 121 IU/L 149(H) CANCELED 117  Total Bilirubin 0.0 - 1.2 mg/dL <0.2 CANCELED 0.2    Lab Results  Component Value Date/Time   TSH 1.830 04/15/2020 04:07 PM   TSH 1.991 Test methodology is 3rd generation TSH 01/16/2009 04:38 PM    CBC Latest Ref Rng & Units 11/10/2020 04/15/2020 03/27/2020  WBC 3.4 - 10.8 x10E3/uL 8.5 10.1 7.2  Hemoglobin 11.1 - 15.9 g/dL 12.7 12.4 11.7(L)  Hematocrit 34.0 - 46.6 % 39.5 37.7 37.0  Platelets 150 - 450 x10E3/uL 362 480(H) 324    Lab Results  Component Value Date/Time   VD25OH 45.2 08/11/2020 02:50 PM   VD25OH 56.1 10/23/2019 03:34 PM    Clinical ASCVD: No  The 10-year ASCVD risk score (Arnett DK, et al., 2019) is: 44%   Values used to calculate the score:     Age: 65  years     Sex: Female     Is Non-Hispanic African American: Yes     Diabetic: Yes     Tobacco smoker: Yes     Systolic Blood Pressure: 174 mmHg     Is BP treated: Yes     HDL Cholesterol: 53 mg/dL     Total Cholesterol: 176 mg/dL    Depression screen [facility] 2/9 06/19/2020 04/08/2020 08/27/2019  Decreased Interest 0 0 0  Down, Depressed, Hopeless 0 0 0  PHQ - 2 Score 0 0 0  Altered sleeping - - -  Tired, decreased energy - - -  Change in appetite - - -  Feeling bad or failure about yourself  - - -  Trouble concentrating - - -  Moving slowly or fidgety/restless - - -  Suicidal thoughts - - -  PHQ-9 Score - - -  Difficult doing work/chores -  - -    Social History   Tobacco Use  Smoking Status Former   Packs/day: 1.00   Years: 34.00   Pack years: 34.00   Types: Cigarettes   Start date: 59   Quit date: 04/08/2005   Years since quitting: 15.8  Smokeless Tobacco Never   BP Readings from Last 3 Encounters:  02/16/21 136/78  01/20/21 124/68  01/14/21 130/82   Pulse Readings from Last 3 Encounters:  02/16/21 74  01/20/21 (!) 102  01/14/21 75   Wt Readings from Last 3 Encounters:  02/16/21 173 lb 6.4 oz (78.7 kg)  01/14/21 171 lb 3.2 oz (77.7 kg)  12/24/20 171 lb 9.6 oz (77.8 kg)   BMI Readings from Last 3 Encounters:  02/16/21 32.76 kg/m  01/20/21 32.35 kg/m  01/14/21 32.35 kg/m    Assessment/Interventions: Review of patient past medical history, allergies, medications, health status, including review of consultants reports, laboratory and other test data, was performed as part of comprehensive evaluation and provision of chronic care management services.   SDOH:  (Social Determinants of Health) assessments and interventions performed: Yes SDOH Interventions    Flowsheet Row Most Recent Value  SDOH Interventions   Financial Strain Interventions Other (Comment)  [LIS - Extra Help]      SDOH Screenings   Alcohol Screen: Not on file  Depression (PHQ2-9): Low Risk    PHQ-2 Score: 0  Financial Resource Strain: High Risk   Difficulty of Paying Living Expenses: Very hard  Food Insecurity: No Food Insecurity   Worried About Charity fundraiser in the Last Year: Never true   Ran Out of Food in the Last Year: Never true  Housing: Not on file  Physical Activity: Inactive   Days of Exercise per Week: 0 days   Minutes of Exercise per Session: 0 min  Social Connections: Not on file  Stress: No Stress Concern Present   Feeling of Stress : Not at all  Tobacco Use: Medium Risk   Smoking Tobacco Use: Former   Smokeless Tobacco Use: Never   Passive Exposure: Not on file  Transportation Needs: No  Transportation Needs   Lack of Transportation (Medical): No   Lack of Transportation (Non-Medical): No    CCM Care Plan  No Known Allergies  Medications Reviewed Today     Reviewed by Arliss Journey, PT (Physical Therapist) on 02/23/21 at [place] List Status: <None>   Medication Order Taking? Sig Documenting Provider Last Dose Status Informant  ACCU-CHEK FASTCLIX LANCETS MISC [id number] No USE TO CHECK BLOOD SUGARS TWICE DAILY Glendale Chard, MD  Taking Active   ACCU-CHEK GUIDE test strip [id number] No USE TO CHECK BLOOD SUGARS TWICE DAILY AS DIRECTED Glendale Chard, MD Taking Active   aspirin EC 81 MG tablet [id number] No Take 1 tablet (81 mg total) by mouth daily. Glendale Chard, MD Taking Active   B-D ULTRAFINE III SHORT PEN 31G X 8 MM MISC [id number] No USE AS DIRECTED TWICE DAILY Glendale Chard, MD Taking Active   Biotin 10 MG CAPS [id number] No Take 1 tablet by mouth daily. Glendale Chard, MD Taking Active   Blood Glucose Monitoring Suppl (ACCU-CHEK GUIDE ME) w/Device KIT [id number] No USE TO CHECK BLOOD GLUCOSE TWICE DAILY Minette Brine, FNP Taking Active   calcium carbonate (OSCAL) 1500 (600 Ca) MG TABS tablet [id number] No Take 1 tablet (1,500 mg total) by mouth daily with breakfast. Glendale Chard, MD Taking Active   carbidopa-levodopa (SINEMET IR) 25-100 MG tablet [id number] No Take 1 tablet by mouth 3 (three) times daily. Follow titration instructions provided separately in writing. Glendale Chard, MD Taking Active   Cholecalciferol 125 MCG (5000 UT) TABS [id number] No Take 1 tablet (5,000 Units total) by mouth daily. Glendale Chard, MD Taking Active   diclofenac Sodium (VOLTAREN) 1 % GEL [id number] No APPLY 2 GRAMS TOPICALLY TO THE AFFECTED AREA FOUR TIMES DAILY Glendale Chard, MD Taking Active   fluticasone [facility]) 50 MCG/ACT nasal spray [id number] No SHAKE LIQUID AND USE 2 SPRAYS IN EACH NOSTRIL DAILY Glendale Chard, MD Taking Active   loratadine (CLARITIN) 10 MG tablet [id number]  No Take 1 tablet by mouth daily Glendale Chard, MD Taking Active   metFORMIN (GLUCOPHAGE) 500 MG tablet [id number] No Take 1 tablet (500 mg total) by mouth daily with breakfast. George Hugh, MD Taking Active   Multiple Vitamins-Minerals (ONE-A-DAY WOMENS 50+ ADVANTAGE) TABS [id number] No Take 1 tablet by mouth daily Glendale Chard, MD Taking Active   NON FORMULARY [id number] No 2 each. Calm [provider] Taking Active            Med Note Jeoffrey Massed Aug 13, 2020 11:53 AM) Taking 2 at night before bed.   Omega-3 Fatty Acids (FISH OIL) 1000 MG CAPS [id number] No Take 1 capsule by mouth daily Glendale Chard, MD Taking Active   omeprazole (PRILOSEC) 40 MG capsule [id number] No TAKE 1 CAPSULE BY MOUTH EVERY DAY BEFORE A MEAL Minette Brine, FNP Taking Active   pioglitazone (ACTOS) 15 MG tablet [id number] No Take 1 tablet (15 mg total) by mouth daily. Glendale Chard, MD Taking Active   Pitavastatin Calcium (LIVALO) 4 MG TABS [id number] No Take 1 tablet (4 mg total) by mouth daily. Glendale Chard, MD Taking Active   telmisartan-hydrochlorothiazide (MICARDIS HCT) 40-12.5 MG tablet [id number] No Take 1 tablet by mouth daily. Glendale Chard, MD Taking Active   Turmeric 500 MG CAPS [id number] No Take 1 capsule by mouth 2 (two) times daily. Glendale Chard, MD Taking Active   vitamin C (ASCORBIC ACID) 500 MG tablet [id number] No Take 1 tablet (500 mg total) by mouth daily. Glendale Chard, MD Taking Active   XULTOPHY 100-3.6 UNIT-MG/ML SOPN [id number] No INJECT 25 UNITS SUBCUTANEOUSLY DAILY Glendale Chard, MD Taking Active   Med List Note Glendale Chard, MD 10/12/18 1448): Change Xultophy to 22 units 10/12/2018            Patient Active Problem List   Diagnosis Date Noted   Decreased dorsalis pedis pulse 11/17/2020   Tachycardia 08/11/2020   Cough 08/11/2020   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  Pure hypercholesterolemia 01/23/2020   Vitamin D deficiency 10/23/2019   Right hip  pain 05/15/2018   Estrogen deficiency 05/15/2018   Class 1 obesity due to excess calories with serious comorbidity and body mass index (BMI) of 31.0 to 31.9 in adult 05/15/2018   Diabetes mellitus with stage 2 chronic kidney disease ([place]) 01/11/2018   Benign hypertension with chronic kidney disease, stage II 01/11/2018    Immunization History  Administered Date(s) Administered   Fluad Quad(high Dose 65+) 01/01/2019, 12/27/2019   Influenza, High Dose Seasonal PF 01/12/2018, 01/01/2019   Influenza-Unspecified 12/30/2020   PFIZER(Purple Top)SARS-COV-2 Vaccination 04/26/2019, 05/18/2019, 03/22/2020, 09/22/2020   Pneumococcal Conjugate-13 10/13/2018   Pneumococcal Polysaccharide-23 03/02/2017   Tdap 09/18/2015   Zoster Recombinat (Shingrix) 11/22/2018, 02/25/2019   Zoster, Live 11/04/2015    Conditions to be addressed/monitored:  Hypertension and Diabetes  Care Plan : [place]  Updates made by Mayford Knife, [place] since 02/24/2021 12:00 AM     Problem: HTN, DM II   Priority: High     Long-Range Goal: Disease Management   Start Date: 01/13/2021  This Visit's Progress: On track  Recent Progress: On track  Priority: High  Note:   Current Barriers:  Unable to independently afford treatment regimen Unable to independently monitor therapeutic efficacy  Pharmacist Clinical Goal(s):  Patient will achieve adherence to monitoring guidelines and medication adherence to achieve therapeutic efficacy through collaboration with PharmD and provider.   Interventions: 1:1 collaboration with Glendale Chard, MD regarding development and update of comprehensive plan of care as evidenced by provider attestation and co-signature Inter-disciplinary care team collaboration (see longitudinal plan of care) Comprehensive medication review performed; medication list updated in electronic medical record  Hypertension (BP goal <130/80) -Controlled -Current treatment: Telmisartan-  Hydrochlorothiazide 40-12.5 mg tablet once per day  -Current home readings: 137/80  -Current dietary habits: she is not eating any fried foods. She is eating more baked chicken and broccoli. Patient reports using Mrs. Dash and no longer using the accent for her food.  -Current exercise habits: she is in physical therapy and was told that she should be walking around the whole gym 5 times twice per day and she is still walking her dog.  -Denies hypotensive/hypertensive symptoms -Educated on BP goals and benefits of medications for prevention of heart attack, stroke and kidney damage; Exercise goal of 150 minutes per week; Importance of home blood pressure monitoring; Proper BP monitoring technique; -Counseled to monitor BP at home at least every other day, document, and provide log at future appointments -Recommended to continue current medication  Diabetes (A1c goal <7%) -Controlled -Current medications: Xultophy - 25 units daily Patient receiving Extra Help through Medicare currently and it is $9.85/- for a 3 month supply  Metformin 500 mg tablet once per day  Actos 15 mg tablet once per day  -Current home glucose readings: she is currently checking her BS twice per day fasting glucose: 80-100 post prandial glucose: 125-130 -Denies hypoglycemic/hyperglycemic symptoms -Current meal patterns: patient reports that her eating habits are good  drinks: she is drinking 4 bottles of water per - 16 ounces per day.  -Current exercise habits: she is in physical therapy and was told that she should be walking around the whole gym 5 times twice per day and she is still walking her dog.  -Congratulated patient on achieving her A1C goals and we discussed maintaining these levels  -Educated on Complications of diabetes including kidney damage, retinal damage, and cardiovascular disease; Benefits of routine  self-monitoring of blood sugar; Carbohydrate counting and/or plate method -Counseled to check  feet daily and get yearly eye exams -Recommend patients Actos be discontinued, collaborated with PCP who agrees, and advised to use a step down method.  -Patient is concerned about the cost of the medication due to it being out network next year and costing 35 dollars per month.   -Currently applying for patient assistance for Xultophy for next year recommended patient receive refill of medication for 90 day supply  -Patient to take one tablet Monday, Wednesday and Friday until she runs out of medications  -Recommended to continue current medication -Health concierge to follow up on [place] application for 3016   Patient Goals/Self-Care Activities Patient will:  - take medications as prescribed as evidenced by patient report and record review  Follow Up Plan: The patient has been provided with contact information for the care management team and has been advised to call with any health related questions or concerns.       Medication Assistance: None required.  Patient affirms current coverage meets needs.  Compliance/Adherence/Medication fill history: Care Gaps: COVID -19 Booster - 01/10/2021 Flu Shot- 01/03/2021   Star-Rating Drugs: Xultophy  Metformin 500 mg tablet Telmisartan -Hydrochlorothiazide   Patient's preferred pharmacy is:  [facility], [place] AT [place] [address] Phone: [phone] Fax: [phone]  [facility] - [place], [place] - [address] [place] [address] Phone: [phone] Fax: [phone]  Uses pill box? Yes Pt endorses 95% compliance  We discussed: Benefits of medication synchronization, packaging and delivery as well as enhanced pharmacist oversight with [facility]. Patient decided to: Continue current medication management strategy  Care Plan and Follow Up Patient Decision:  Patient agrees to Care  Plan and Follow-up.  Plan: The patient has been provided with contact information for the care management team and has been advised to call with any health related questions or concerns.   Orlando Penner, CPP, PharmD Clinical Pharmacist Practitioner [facility] [phone]

## 2021-02-19 NOTE — Therapy (Signed)
[facility] [address] [place], [place], [address] Phone: [phone]   Fax:  [phone]  Occupational Therapy Treatment  Patient Details  Name: April Larson MRN: [id number] Date of Birth: November 22, 1950 Referring Provider (OT): Dr. Rexene Alberts   Encounter Date: 02/18/2021   OT End of Session - 02/19/21 1432     Visit Number 9    Number of Visits 25    Date for OT Re-Evaluation 03/31/21    Authorization - Visit Number 9    Authorization - Number of Visits 10    Progress Note Due on Visit 10    OT Start Time 2229    OT Stop Time 7989    OT Time Calculation (min) 41 min             Past Medical History:  Diagnosis Date   Diabetes mellitus without complication ([place])    Hypertension    Substance abuse ([place])     Past Surgical History:  Procedure Laterality Date   EYE SURGERY      There were no vitals filed for this visit.   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 PMH significant for hypertension, diabetes, reflux disease, allergies, sleep apnea and borderline obesity, hx of rib fx s/p fall,    Patient Stated Goals maintain independence    Currently in Pain? No/denies                   Treatment: PWR! Up, rock and twist in modified quadraped, 10 reps each, min-mod v.c PWR! Hands for PWR! Up and PWR! Twist, min vc. Reviewed coordination HEP for: coins, cards and rotating ball with emphasis on larger amplitude movements, pt with improved performance.  Donning vest like a cape with good performance                OT Short Term Goals - 02/18/21 1514       OT SHORT TERM GOAL #1   Title I with PD specific HEP    Status Achieved      OT SHORT TERM GOAL #2   Title Pt will verbalize understanding of adapted strategies to maximize safety and I with ADLs/ IADLs (ie: cutting food,  fasten buttons, writing)    Status On-going      OT SHORT TERM GOAL #3   Title Pt will verbalize understanding of compensatory strategies  for visual deficits.    Status On-going      OT SHORT TERM GOAL #4   Title Pt will demonstrate improved fine motor coordination for ADLs as evidenced by decreasing 9 hole peg test score for RUE by 3 secs    Status On-going   43.85, 33.82partially met, not yet consistent     OT SHORT TERM GOAL #5   Title assess PPT#4 don/ doff jacket and set goal prn    Status Deferred   13.72, pt in structed in adapted techniques after demo     OT SHORT TERM GOAL #6   Title Pt will demonstrate understanding of memory compensations and ways to keep thinking skills sharp    Status Achieved   issued 02/10/21              OT Long Term Goals - 02/18/21 1515       OT LONG TERM GOAL #1   Title Pt will write a short paragraph with  100% legibility and  minimal decrease in letter size    Time 12    Period Weeks    Status On-going      OT LONG TERM GOAL #2   Title Pt will demonstrate ability to retrieve a lightweight object at 115 shoulder flexion and -15 elbow extension with RUE    Time 12    Period Weeks    Status On-going      OT LONG TERM GOAL #3   Title Pt will demonstrate ability to retrieve a lightweight object at 120 shoulder flexion and -15 elbow extension with LUE    Time 12    Period Weeks    Status On-going      OT LONG TERM GOAL #4   Title Pt will demonstrate improved ease with feeding as evidenced by decreasing PPT#2 by 3 secs    Time 12    Period Weeks    Status On-going      OT LONG TERM GOAL #5   Title Pt will demonstrate improved ease with fastening buttons as evidenced by decreasing 3 button/ unbutton time to 70 secs or less    Baseline 83 secs    Time 12    Period Weeks    Status On-going      OT LONG TERM GOAL #6   Title Pt will demonstrate improved bilateral UE functional use for ADLs as evidenced by increasing box/ blocks  score by 4 blocks with right and left UE's    Baseline R 41, L 39    Time 12    Period Weeks    Status On-going                   Plan - 02/19/21 1433     Clinical Impression Statement Pt is progressing towards goals. Pt demonstrates improving caryover of adapted stretagies for ADLs. she donned her vest with big movements.    OT Occupational Profile and History Detailed Assessment- Review of Records and additional review of physical, cognitive, psychosocial history related to current functional performance    Occupational performance deficits (Please refer to evaluation for details): ADL's;IADL's;Leisure;Social Participation;Play;Work    Marketing executive / Function / Physical Skills ADL;Endurance;UE functional use;Balance;Vision;Flexibility;FMC;ROM;Gait;Coordination;GMC;Sensation;Decreased knowledge of precautions;Decreased knowledge of use of DME;IADL;Strength;Dexterity;Mobility;Tone    Cognitive Skills Memory;Problem Solve;Safety Awareness;Thought    Rehab Potential Good    Clinical Decision Making Limited treatment options, no task modification necessary    Comorbidities Affecting Occupational Performance: May have comorbidities impacting occupational performance    Modification or Assistance to Complete Evaluation  No modification of tasks or assist necessary to complete eval    OT Frequency 2x / week   plus eval, anticipate d/c following 8 weeks dependent upon pt progress.   OT Duration 12 weeks    OT Treatment/Interventions Self-care/ADL training;Energy conservation;Visual/perceptual remediation/compensation;Patient/family education;DME and/or AE instruction;Gait Training;Passive range of motion;Balance training;Fluidtherapy;Cryotherapy;Electrical Stimulation;Functional Mobility Training;Splinting;Therapeutic activities;Manual Therapy;Therapeutic exercise;Moist Heat;Neuromuscular education;Cognitive remediation/compensation    Plan check short term goals, consider scheduling more  visits    Consulted and Agree with Plan of Care Patient             Patient will benefit from skilled therapeutic intervention in order to improve the following deficits and impairments:   Body Structure / Function / Physical Skills: ADL, Endurance, UE functional use, Balance, Vision, Flexibility, FMC, ROM, Gait, Coordination, GMC, Sensation, Decreased knowledge of precautions, Decreased knowledge of use of DME, IADL, Strength, Dexterity, Mobility, Tone Cognitive Skills: Memory, Problem Solve, Safety Awareness,  Thought     Visit Diagnosis: Abnormal posture  Unsteadiness on feet  Other symptoms and signs involving the nervous system  Muscle weakness (generalized)  Other abnormalities of gait and mobility  Other lack of coordination  Stiffness of right elbow, not elsewhere classified  Stiffness of right shoulder, not elsewhere classified  Stiffness of left shoulder, not elsewhere classified    Problem List Patient Active Problem List   Diagnosis Date Noted   Decreased dorsalis pedis pulse 11/17/2020   Tachycardia 08/11/2020   Cough 08/11/2020   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   Pure hypercholesterolemia 01/23/2020   Vitamin D deficiency 10/23/2019   Right hip pain 05/15/2018   Estrogen deficiency 05/15/2018   Class 1 obesity due to excess calories with serious comorbidity and body mass index (BMI) of 31.0 to 31.9 in adult 05/15/2018   Diabetes mellitus with stage 2 chronic kidney disease ([place]) 01/11/2018   Benign hypertension with chronic kidney disease, stage II 01/11/2018    Inger Wiest, OT/L 02/19/2021, 2:41 PM  [facility] [address] [place], [place], [address] Phone: [phone]   Fax:  [phone]  Name: April Larson MRN: [id number] Date of Birth: May 22, 1950

## 2021-02-19 NOTE — Therapy (Signed)
[facility] [address] [place], [place], [address] Phone: [phone]   Fax:  [phone]  Speech Language Pathology Treatment/Progress Note  Patient Details  Name: April Larson MRN: [id number] Date of Birth: 20-May-1950 Referring Provider (SLP): [name], MD   Encounter Date: 02/18/2021   End of Session - 02/19/21 0856     Visit Number 10    Number of Visits 25    Date for SLP Re-Evaluation 04/03/21    Authorization Type [employer] Medicare    SLP Start Time 1615    SLP Stop Time  1700    SLP Time Calculation (min) 45 min    Activity Tolerance Patient tolerated treatment well             Past Medical History:  Diagnosis Date   Diabetes mellitus without complication ([place])    Hypertension    Substance abuse ([place])     Past Surgical History:  Procedure Laterality Date   EYE SURGERY      There were no vitals filed for this visit.   Subjective Assessment - 02/19/21 0853     Subjective "I did better (on the homework)"    Currently in Pain? 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  Objective Measurements: Pt has made some limited progress towards ST goals. Pt is now exhibiting some improvements in emergent/anticipatory awareness and sustained/selective attention as multiple ST sessions have focused on this. Due to delayed processing, pt benefits from repetition, rephrasing, and clarification to aid attention, processing, and recall. Dysarthria HEP initiated for maintenance of speech volume, but some inconsistent carryover reported. Pt is now implementing HEP more consistently compared to initial sessions, which appeared related to recall. Pt would benefit from more ST to  optimize outcomes.   Goal Update: see goals below  Plan: continue per POC  Reason Skilled Services are Required: pt has not yet met rehab potential so SLP recommends continuation of ST services to maximize cognitive linguistic skills and optimize loudness       ADULT SLP TREATMENT - 02/18/21 1638       General Information   Behavior/Cognition Alert;Cooperative;Pleasant mood;Requires cueing      Treatment Provided   Treatment provided Cognitive-Linquistic      Cognitive-Linquistic Treatment   Treatment focused on Cognition;Dysarthria    Skilled Treatment Pt returned with functional math homework completed with 100% accuracy achieved. Pt reportedly double checked. Pt and sister still indicate slower processing. SLP targeted attention and problem solving task, in which pt exhibited extended processing time and eventually requested SLP clarification. Accuracy improved following clarification. SLP generated strategy for patient to read instructions 2-3x prior to initiating tasks. Pt completed next task correctly but did not follow all the instructions (cross out the mugs). When pt did follow the entirety of instructions, she ended with an inccurate result. SLP provided recommendations to aid processing and comprehension, including requesting clarification/assistance and reading instructions 2-3x prior to initiating task. Pt's sister verbalized concerned that pt is often repeating what she is saying. SLP believes this is possibly an unconscious behavior to compensate for delayed processing.      Assessment / Recommendations / Plan   Plan Continue with current plan of care      Progression  Toward Goals   Progression toward goals Progressing toward goals              SLP Education - 02/18/21 1659     Education Details re-read instructions, clarifying/confirming questions    Person(s) Educated Patient;Caregiver(s)    Methods Explanation;Demonstration;Verbal cues;Handout     Comprehension Verbalized understanding;Returned demonstration;Verbal cues required;Need further instruction              SLP Short Term Goals - 02/10/21 1617       SLP SHORT TERM GOAL #1   Title Pt will utilize memory and attention compensations to optimize performance at work and home given occasional min A over 2 sessions    Baseline 02-05-21    Time 4    Period Weeks    Status Partially Met    Target Date 02/06/21      SLP SHORT TERM GOAL #2   Title Pt will identify and correct errors on structured therapy tasks with 80% accuracy given occasional min A over 2 sessions    Time 4    Period Weeks    Status Not Met    Target Date 02/06/21      SLP SHORT TERM GOAL #3   Title Pt will complete dysarthria HEP given occasional min A over 3 sessions    Baseline 02-05-21    Time 4    Period Weeks    Status Partially Met    Target Date 02/06/21      SLP SHORT TERM GOAL #4   Title Pt will sustain loud /a/ with average of low 90s dB over 3 sessions    Time 4    Period Weeks    Status Not Met    Target Date 02/06/21      SLP SHORT TERM GOAL #5   Title Pt will maintain WNL conversational volume (70-72 dB) during 5 minute conversation given occasional min A over 2 sessions    Time 4    Period Weeks    Status Not Met    Target Date 02/06/21              SLP Long Term Goals - 02/19/21 0859       SLP LONG TERM GOAL #1   Title Pt will utilize memory and attention compensations to optimize performance at work and home given rare min A over 2 sessions    Time 8    Period Weeks    Status On-going    Target Date 03/06/21      SLP LONG TERM GOAL #2   Title Pt will identify and correct errors on structured therapy tasks with 90% accuracy given rare min A over 2 sessions    Time 8    Period Weeks    Status On-going    Target Date 03/06/21      SLP LONG TERM GOAL #3   Title Pt will complete dysarthria HEP BID > 2 weeks    Time 12    Period Weeks    Status On-going     Target Date 04/03/21      SLP LONG TERM GOAL #4   Title Pt will maintain WNL conversational volume (70-72 dB) during 15+ minute conversation given rare min A over 2 sessions    Time 12    Period Weeks    Status On-going    Target Date 04/03/21              Plan - 02/19/21 1610  Clinical Impression Statement "Yaresly" was referred for OPST to address dysarthria and 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 Pt and sister report overt change in processing speed. SLP targeted processing and attention related to follow instructions for simple to mod complex cognitive tasks. Pt benefited from increased repetition/re-reading, rephrasing, and clarification to optimize performance. Due to cognitive and speech decline impacting daily living tasks, skilled ST continues as warranted to optimize pt's functional independence, safety, and communication effectiveness.    Speech Therapy Frequency 2x / week    Duration 12 weeks    Treatment/Interventions Cognitive reorganization;Multimodal communcation approach;Language facilitation;Compensatory techniques;Internal/external aids;SLP instruction and feedback;Patient/family education;Functional tasks;Cueing hierarchy;Compensatory strategies    Potential to Achieve Goals Good    Consulted and Agree with Plan of Care Patient             Patient will benefit from skilled therapeutic intervention in order to improve the following deficits and impairments:   Cognitive communication deficit  Dysarthria and anarthria    Problem List Patient Active Problem List   Diagnosis Date Noted   Decreased dorsalis pedis pulse 11/17/2020   Tachycardia 08/11/2020   Cough 08/11/2020   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   Pure hypercholesterolemia 01/23/2020   Vitamin D deficiency 10/23/2019   Right hip pain 05/15/2018   Estrogen deficiency 05/15/2018   Class 1 obesity due to excess calories with serious comorbidity  and body mass index (BMI) of 31.0 to 31.9 in adult 05/15/2018   Diabetes mellitus with stage 2 chronic kidney disease ([place]) 01/11/2018   Benign hypertension with chronic kidney disease, stage II 01/11/2018    Alinda Deem, MA CCC-SLP 02/19/2021, 9:09 AM  [facility] [address] [place], [place], [address] Phone: [phone]   Fax:  [phone]   Name: April Larson MRN: [id number] Date of Birth: 17-May-1950

## 2021-02-20 LAB — CYTOLOGY - PAP
Comment: NEGATIVE
Diagnosis: NEGATIVE
High risk HPV: NEGATIVE

## 2021-02-23 ENCOUNTER — Other Ambulatory Visit: Payer: Self-pay

## 2021-02-23 ENCOUNTER — Ambulatory Visit: Payer: Medicare Other | Admitting: Physical Therapy

## 2021-02-23 ENCOUNTER — Ambulatory Visit: Payer: Medicare Other | Admitting: Occupational Therapy

## 2021-02-23 ENCOUNTER — Encounter: Payer: Self-pay | Admitting: Physical Therapy

## 2021-02-23 ENCOUNTER — Telehealth: Payer: Medicare Other

## 2021-02-23 ENCOUNTER — Ambulatory Visit: Payer: Medicare Other | Admitting: Speech Pathology

## 2021-02-23 ENCOUNTER — Telehealth: Payer: Self-pay | Admitting: Neurology

## 2021-02-23 DIAGNOSIS — R293 Abnormal posture: Secondary | ICD-10-CM | POA: Diagnosis not present

## 2021-02-23 DIAGNOSIS — M25612 Stiffness of left shoulder, not elsewhere classified: Secondary | ICD-10-CM

## 2021-02-23 DIAGNOSIS — R2689 Other abnormalities of gait and mobility: Secondary | ICD-10-CM

## 2021-02-23 DIAGNOSIS — M6281 Muscle weakness (generalized): Secondary | ICD-10-CM

## 2021-02-23 DIAGNOSIS — R278 Other lack of coordination: Secondary | ICD-10-CM

## 2021-02-23 DIAGNOSIS — M25621 Stiffness of right elbow, not elsewhere classified: Secondary | ICD-10-CM | POA: Diagnosis not present

## 2021-02-23 DIAGNOSIS — R41841 Cognitive communication deficit: Secondary | ICD-10-CM

## 2021-02-23 DIAGNOSIS — R2681 Unsteadiness on feet: Secondary | ICD-10-CM

## 2021-02-23 DIAGNOSIS — R29818 Other symptoms and signs involving the nervous system: Secondary | ICD-10-CM

## 2021-02-23 DIAGNOSIS — R471 Dysarthria and anarthria: Secondary | ICD-10-CM | POA: Diagnosis not present

## 2021-02-23 DIAGNOSIS — M25622 Stiffness of left elbow, not elsewhere classified: Secondary | ICD-10-CM | POA: Diagnosis not present

## 2021-02-23 DIAGNOSIS — M25611 Stiffness of right shoulder, not elsewhere classified: Secondary | ICD-10-CM

## 2021-02-23 NOTE — Chronic Care Management (AMB) (Signed)
  Care Management   Note  02/23/2021 Name: April Larson MRN: [id number] DOB: 26-Jan-1951  April Larson is a 70 y.o. year old female who is a primary care patient of Glendale Chard, MD and is actively engaged with the care management team. I reached out to April Larson by phone today to assist with re-scheduling a follow up visit with the RN Case Manager  Follow up plan: Unsuccessful telephone outreach attempt made. A HIPAA compliant phone message was left for the patient providing contact information and requesting a return call.  The care management team will reach out to the patient again over the next 7 days.  If patient returns call to provider office, please advise to call [place] at [facility]  Direct Dial: [phone]

## 2021-02-23 NOTE — Patient Instructions (Addendum)
   With Parkinson's, often processing is slightly slower  It may be harder to process rapid speech or participate in group conversations  You need to stick up for yourself - ask for information in writing or repeat back what you have heard  Keep background noises to a minimum when you are conversing, especially in a group  When you do your finances, make sure you do them when you are most awake and alert with no distractions - turn off TV, radio, etc   Get Bernise's attention before you speak  Use eye contact and face the person you are speaking to  Be in close proximity to the person you are speaking to  Turn down any noise in the environment such as the TV, walk away from loud appliances, air conditioners, fans, dish washers etc  In large gatherings, sit or stay on the side not the center of the room  Try to sit with a wall behind you or in a corner so noise isn't coming at you from all directions when dining out or attending gatherings  If you have airpods or blue tooth headphones, the Live Listen feature on you iPhone can sync with your pods to act as a microphone to enhance conversation near you or at your table (control center, hearing, AirPods, Live Listen)  Talk Path App by Adella Hare - See all exercises, pick an exercise and adjust the level to make it hard enough  Talk Path News - practice reading aloud  Keep up loud AH and 5 minutes of reading focusing on volume    [employer] [place] saw puzzles Easy cross words Memory match Board games [employer] Emmonak a new game!  Listen to and discuss Ted Talks or Podcasts Read and discuss short articles of interest to you- Take notes on these if memory is a challenge

## 2021-02-23 NOTE — Telephone Encounter (Signed)
Pt called, insurance will cover home therapy for physical, occupational and speech. Insurance require a note from the physician I need home therapy. Would like a call from the nurse.

## 2021-02-23 NOTE — Therapy (Addendum)
[facility] [address] [place], [place], [address] Phone: [phone]   Fax:  [phone]  Occupational Therapy Treatment  Patient Details  Name: April Larson MRN: [id number] Date of Birth: 18-Aug-1950 Referring Provider (OT): Dr. Rexene Alberts   Encounter Date: 02/23/2021   OT End of Session - 02/23/21 1544     Visit Number 10    Number of Visits 25    Date for OT Re-Evaluation 03/31/21    Authorization - Visit Number 10    Authorization - Number of Visits 10    Progress Note Due on Visit 68    OT Start Time 1450    OT Stop Time 5681    OT Time Calculation (min) 40 min             Past Medical History:  Diagnosis Date   Diabetes mellitus without complication ([place])    Hypertension    Substance abuse ([place])     Past Surgical History:  Procedure Laterality Date   EYE SURGERY      There were no vitals filed for this visit.   Subjective Assessment - 02/23/21 1553     Subjective  Pt denies pain    Pertinent History Pt is a 70 y.o female referred for  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 PMH significant for hypertension, diabetes, reflux disease, allergies, sleep apnea and borderline obesity, hx of rib fx s/p fall,    Patient Stated Goals maintain independence    Currently in Pain? No/denies                  Treatment: Therapist started checking progress towards goals, as pt requests d/c next visit due to financial concerns. See goals for updates. Handwriting strategies with good legibility, and min decrease in letter size. PWR!hands, then flipping cards with large amplitude movements, min v.c Pt was made aware that she would not qualify for Home health services as she is driving and working outside the home.                OT Education - 02/23/21 1550     Education Details PWR! up, rock, twist modified quadraped    Person(s) Educated Patient;Other (comment)    brother   Methods Explanation;Demonstration;Verbal cues;Handout    Comprehension Verbalized understanding;Returned demonstration;Verbal cues required              OT Short Term Goals - 02/23/21 1504       OT SHORT TERM GOAL #1   Title I with PD specific HEP    Status Achieved      OT SHORT TERM GOAL #2   Title Pt will verbalize understanding of adapted strategies to maximize safety and I with ADLs/ IADLs (ie: cutting food, fasten buttons, writing)    Status Achieved   education provided     OT SHORT TERM GOAL #3   Title Pt will verbalize understanding of compensatory strategies  for visual deficits.    Status Achieved      OT SHORT TERM GOAL #4   Title Pt will demonstrate improved fine motor coordination for ADLs as evidenced by decreasing 9 hole peg test score for RUE by 3 secs    Status On-going   43.85, 33.82partially met, not yet consistent     OT SHORT TERM GOAL #5   Title assess PPT#4 don/ doff jacket and set goal prn    Status Deferred   13.72, pt in structed in adapted techniques after demo  OT SHORT TERM GOAL #6   Title Pt will demonstrate understanding of memory compensations and ways to keep thinking skills sharp    Status Achieved   issued 02/10/21              OT Long Term Goals - 02/23/21 1505       OT LONG TERM GOAL #1   Title Pt will write a short paragraph with 100% legibility and  minimal decrease in letter size    Time 12    Period Weeks    Status On-going   legible however slight decrease in letter size     OT LONG TERM GOAL #2   Title Pt will demonstrate ability to retrieve a lightweight object at 115 shoulder flexion and -15 elbow extension with RUE    Time 12    Period Weeks    Status Partially Met   130,-15     OT LONG TERM GOAL #3   Title Pt will demonstrate ability to retrieve a lightweight object at 120 shoulder flexion and -15 elbow extension with LUE    Time 12    Period Weeks    Status Partially Met   125, -115     OT  LONG TERM GOAL #4   Title Pt will demonstrate improved ease with feeding as evidenced by decreasing PPT#2 by 3 secs    Time 12    Period Weeks    Status On-going      OT LONG TERM GOAL #5   Title Pt will demonstrate improved ease with fastening buttons as evidenced by decreasing 3 button/ unbutton time to 70 secs or less    Baseline 83 secs    Time 12    Period Weeks    Status On-going      OT LONG TERM GOAL #6   Title Pt will demonstrate improved bilateral UE functional use for ADLs as evidenced by increasing box/ blocks score by 4 blocks with right and left UE's    Baseline R 41, L 39    Time 12    Period Weeks    Status Partially Met   RUE 42 LUE 49 met for left                  Plan - 02/23/21 1506     Clinical Impression Statement For the reporting period of 01/06/21-02/23/21 pt has made good overall progress. She has achieved 3/4 short term goals . Pt can benefit from continued skilled occupational  therapy to address the following deficits: bradykinesia, decrease balance, decreased coordination, visual deficits,and cognitve deficits in order to maximize pt's safety and I with daily activities.    OT Occupational Profile and History Detailed Assessment- Review of Records and additional review of physical, cognitive, psychosocial history related to current functional performance    Occupational performance deficits (Please refer to evaluation for details): ADL's;IADL's;Leisure;Social Participation;Play;Work    Marketing executive / Function / Physical Skills ADL;Endurance;UE functional use;Balance;Vision;Flexibility;FMC;ROM;Gait;Coordination;GMC;Sensation;Decreased knowledge of precautions;Decreased knowledge of use of DME;IADL;Strength;Dexterity;Mobility;Tone    Cognitive Skills Memory;Problem Solve;Safety Awareness;Thought    Rehab Potential Good    Clinical Decision Making Limited treatment options, no task modification necessary    Comorbidities Affecting Occupational  Performance: May have comorbidities impacting occupational performance    Modification or Assistance to Complete Evaluation  No modification of tasks or assist necessary to complete eval    OT Frequency 2x / week   plus eval, anticipate d/c following 8 weeks dependent upon pt progress.  OT Duration 12 weeks    OT Treatment/Interventions Self-care/ADL training;Energy conservation;Visual/perceptual remediation/compensation;Patient/family education;DME and/or AE instruction;Gait Training;Passive range of motion;Balance training;Fluidtherapy;Cryotherapy;Electrical Stimulation;Functional Mobility Training;Splinting;Therapeutic activities;Manual Therapy;Therapeutic exercise;Moist Heat;Neuromuscular education;Cognitive remediation/compensation    Plan schedule 1 aditional visit and screens, anticipate d/c next vist due to pt. financial concerns.    Consulted and Agree with Plan of Care Patient;Family member/caregiver    Family Member Consulted brother             Patient will benefit from skilled therapeutic intervention in order to improve the following deficits and impairments:   Body Structure / Function / Physical Skills: ADL, Endurance, UE functional use, Balance, Vision, Flexibility, FMC, ROM, Gait, Coordination, GMC, Sensation, Decreased knowledge of precautions, Decreased knowledge of use of DME, IADL, Strength, Dexterity, Mobility, Tone Cognitive Skills: Memory, Problem Solve, Safety Awareness, Thought     Visit Diagnosis: Abnormal posture  Unsteadiness on feet  Other symptoms and signs involving the nervous system  Muscle weakness (generalized)  Other abnormalities of gait and mobility  Other lack of coordination  Stiffness of right elbow, not elsewhere classified  Stiffness of right shoulder, not elsewhere classified  Stiffness of left shoulder, not elsewhere classified    Problem List Patient Active Problem List   Diagnosis Date Noted   Decreased dorsalis pedis  pulse 11/17/2020   Tachycardia 08/11/2020   Cough 08/11/2020   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   Pure hypercholesterolemia 01/23/2020   Vitamin D deficiency 10/23/2019   Right hip pain 05/15/2018   Estrogen deficiency 05/15/2018   Class 1 obesity due to excess calories with serious comorbidity and body mass index (BMI) of 31.0 to 31.9 in adult 05/15/2018   Diabetes mellitus with stage 2 chronic kidney disease ([place]) 01/11/2018   Benign hypertension with chronic kidney disease, stage II 01/11/2018    Anea Fodera, OT/L 02/23/2021, 3:53 PM  [facility] [address] [place], [place], [address] Phone: [phone]   Fax:  [phone]  Name: April Larson MRN: [id number] Date of Birth: [birth year]

## 2021-02-23 NOTE — Telephone Encounter (Signed)
Patient is currently being seen/treated by neuro rehab next door (had an appointment today). [employer] Medicare is extremely difficult to place with a home health agency because [facility] Medicare does not pay well for in-home therapy. I am not familiar with Medicare guidelines so I am not sure if the patient can switch to in-home after starting outpatient treatment. I am checking with [facility] (formerly [facility]) to see if they can help.

## 2021-02-23 NOTE — Therapy (Signed)
[facility] [address] [place], [place], [address] Phone: [phone]   Fax:  [phone]  Speech Language Pathology Treatment & Discharge Summary  Patient Details  Name: April Larson MRN: [id number] Date of Birth: May 14, 1950 Referring Provider (SLP): April Age, MD   Encounter Date: 02/23/2021   End of Session - 02/23/21 1449     Visit Number 11    Number of Visits 25    Date for SLP Re-Evaluation 04/03/21    Authorization Type UHC Medicare    SLP Start Time 56    SLP Stop Time  1441    SLP Time Calculation (min) 39 min             Past Medical History:  Diagnosis Date   Diabetes mellitus without complication ([facility])    Hypertension    Substance abuse ([place])     Past Surgical History:  Procedure Laterality Date   EYE SURGERY      There were no vitals filed for this visit.   Subjective Assessment - 02/23/21 1443     Subjective "I called my doctor to start [facility]"    Patient is accompained by: Family member   brother, April Larson   Currently in Pain? No/denies                   ADULT SLP TREATMENT - 02/23/21 1411       General Information   Behavior/Cognition Alert;Cooperative;Pleasant mood;Requires cueing      Treatment Provided   Treatment provided Cognitive-Linquistic      Cognitive-Linquistic Treatment   Treatment focused on Cognition;Dysarthria;Patient/family/caregiver education    Skilled Treatment Pt will be starting HH due to co-pays. She agrees this will be her last session here. She enters room with 68dB average. Loud Ah averaged 90dB with mod I. Oral reading paragraphs also averaged 70dB with rare min A. Simple conversation averaged 69dB with occasional min A for volume and breath support. She returned HW and required cues to ID 2 errors due to attention to detail impairment. As this is her last session, I introduced her and April Larson to the Talk Path and Talk  Path news apps as an option to carryover cognitive activities at home. Provided edcuation re: compensationsn for slow processing, attention and memory. See patient instructions      Assessment / Recommendations / Plan   Plan Discharge SLP treatment due to (comment)   pt request     Progression Toward Goals   Progression toward goals Goals met, education completed, patient discharged from [facility] Education - 02/23/21 1434     Education Details compensations for memory, attention, processing; HEP    Person(s) Educated Patient;Caregiver(s)   brother April Larson   Comprehension Verbalized understanding;Returned demonstration;Verbal cues required;Need further instruction             SPEECH THERAPY DISCHARGE SUMMARY  Visits from Start of Care: 11  Current functional level related to goals / functional outcomes: See goals below   Remaining deficits: Hypokinetic dysarthria, attention, processing   Education / Equipment: HEP for dysarthria, compensations for dysarthria and cognition   Patient agrees to discharge. Patient goals were partially met. Patient is being discharged due to financial reasons.Marland Kitchen      SLP Short Term Goals - 02/23/21 1449       SLP SHORT TERM GOAL #1   Title Pt will utilize memory and attention compensations  to optimize performance at work and home given occasional min A over 2 sessions    Baseline 02-05-21    Time 4    Period Weeks    Status Partially Met    Target Date 02/06/21      SLP SHORT TERM GOAL #2   Title Pt will identify and correct errors on structured therapy tasks with 80% accuracy given occasional min A over 2 sessions    Time 4    Period Weeks    Status Not Met    Target Date 02/06/21      SLP SHORT TERM GOAL #3   Title Pt will complete dysarthria HEP given occasional min A over 3 sessions    Baseline 02-05-21    Time 4    Period Weeks    Status Partially Met    Target Date 02/06/21      SLP SHORT TERM GOAL #4    Title Pt will sustain loud /a/ with average of low 90s dB over 3 sessions    Time 4    Period Weeks    Status Not Met    Target Date 02/06/21      SLP SHORT TERM GOAL #5   Title Pt will maintain WNL conversational volume (70-72 dB) during 5 minute conversation given occasional min A over 2 sessions    Time 4    Period Weeks    Status Not Met    Target Date 02/06/21              SLP Long Term Goals - 02/23/21 1449       SLP LONG TERM GOAL #1   Title Pt will utilize memory and attention compensations to optimize performance at work and home given rare min A over 2 sessions    Time 7    Period Weeks    Status Not Met      SLP LONG TERM GOAL #2   Title Pt will identify and correct errors on structured therapy tasks with 90% accuracy given rare min A over 2 sessions    Time 7    Period Weeks    Status Not Met      SLP LONG TERM GOAL #3   Title Pt will complete dysarthria HEP BID > 2 weeks    Time 7    Period Weeks    Status Not Met      SLP LONG TERM GOAL #4   Title Pt will maintain WNL conversational volume (70-72 dB) during 15+ minute conversation given rare min A over 2 sessions    Time 7    Period Weeks    Status Not Met              Plan - 02/23/21 1447     Clinical Impression Statement "April Larson" was referred for OPST to address dysarthria and 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 Pt and sister report overt change in processing speed. SLP targeted processing and attention related to follow instructions for simple to mod complex cognitive tasks. Pt benefited from increased repetition/re-reading, rephrasing, and clarification to optimize performance. Due to cognitive and speech decline impacting daily living tasks, skilled ST continues as warranted to optimize pt's functional independence, safety, and communication effectiveness. However, April Larson will be starting [facility] therapies due to high co-pay with outpt ST. We are in  agreement this will be her last session. Volume is averaging 69dB in simple conversation. Education re: compensations for processing and  attention provided to pt and her brother. D/C ST at this time    Speech Therapy Frequency 2x / week    Duration 12 weeks    Treatment/Interventions Cognitive reorganization;Multimodal communcation approach;Language facilitation;Compensatory techniques;Internal/external aids;SLP instruction and feedback;Patient/family education;Functional tasks;Cueing hierarchy;Compensatory strategies    Potential to Achieve Goals Good             Patient will benefit from skilled therapeutic intervention in order to improve the following deficits and impairments:   Cognitive communication deficit  Dysarthria and anarthria    Problem List Patient Active Problem List   Diagnosis Date Noted   Decreased dorsalis pedis pulse 11/17/2020   Tachycardia 08/11/2020   Cough 08/11/2020   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   Pure hypercholesterolemia 01/23/2020   Vitamin D deficiency 10/23/2019   Right hip pain 05/15/2018   Estrogen deficiency 05/15/2018   Class 1 obesity due to excess calories with serious comorbidity and body mass index (BMI) of 31.0 to 31.9 in adult 05/15/2018   Diabetes mellitus with stage 2 chronic kidney disease ([place]) 01/11/2018   Benign hypertension with chronic kidney disease, stage II 01/11/2018    Clarie Camey, Annye Rusk, CCC-SLP MS, CCC-SLP 02/23/2021, 2:50 PM  [facility] [address] [place], [place], [address] Phone: [phone]   Fax:  [phone]   Name: April Larson MRN: [id number] Date of Birth: 04-16-1950

## 2021-02-23 NOTE — Therapy (Signed)
[facility] [address] [place], [place], [address] Phone: [phone]   Fax:  [phone]  Physical Therapy Treatment  Patient Details  Name: April Larson MRN: [id number] Date of Birth: 01-23-1951 Referring Provider (PT): [name], MD   Encounter Date: 02/23/2021   PT End of Session - 02/23/21 1536     Visit Number 11    Number of Visits 17    Date for PT Re-Evaluation 04/09/21    Authorization Type UHC Medicare - needs 10th visit PN    PT Start Time 1534    PT Stop Time 1616    PT Time Calculation (min) 42 min    Equipment Utilized During Treatment Gait belt    Activity Tolerance Patient tolerated treatment well    Behavior During Therapy WFL for tasks assessed/performed             Past Medical History:  Diagnosis Date   Diabetes mellitus without complication ([facility])    Hypertension    Substance abuse ([place])     Past Surgical History:  Procedure Laterality Date   EYE SURGERY      There were no vitals filed for this visit.   Subjective Assessment - 02/23/21 1536     Subjective Exercises are going well at home. Walking everyday, doing 5 laps at [employer]. church currently doesn't have a seated exercise bike, they are looking at getting one soon.    Patient is accompained by: Family member   Sister, [name]   Pertinent History PMH significant for hypertension, diabetes, reflux disease, allergies, sleep apnea and borderline obesity, hx of rib fx s/p fall    Limitations Walking;House hold activities    Diagnostic tests CT shows generalized atrophy and white matter disease , DaTSCAN results are suggestive of Parkinsonian syndromes.    Patient Stated Goals Wants to work on walking/balance.    Currently in Pain? No/denies                [facility] PT Assessment - 02/23/21 1541       Mini-BESTest   Sit To Stand Normal: Comes to stand without use of hands and stabilizes  independently.    Rise to Toes Normal: Stable for 3 s with maximum height.    Stand on one leg (left) Moderate: < 20 s   5.06, 3.44   Stand on one leg (right) Moderate: < 20 s   2.94, 2.69   Stand on one leg - lowest score 1    Compensatory Stepping Correction - Forward Normal: Recovers independently with a single, large step (second realignement is allowed).    Compensatory Stepping Correction - Backward Moderate: More than one step is required to recover equilibrium   2 steps   Compensatory Stepping Correction - Left Lateral Severe: Falls, or cannot step   does not step   Compensatory Stepping Correction - Right Lateral Moderate: Several steps to recover equilibrium    Stepping Corredtion Lateral - lowest score 0    Stance - Feet together, eyes open, firm surface  Normal: 30s    Stance - Feet together, eyes closed, foam surface  Normal: 30s    Incline - Eyes Closed Normal: Stands independently 30s and aligns with gravity    Change in Gait Speed Normal: Significantly changes walkling speed without imbalance    Walk with head turns - Horizontal Normal: performs head turns with no change in gait speed and good balance    Walk with pivot  turns Moderate:Turns with feet close SLOW (>4 steps) with good balance.    Step over obstacles Normal: Able to step over box with minimal change of gait speed and with good balance.    Timed UP & GO with Dual Task Moderate: Dual Task affects either counting OR walking (>10%) when compared to the TUG without Dual Task.    Mini-BEST total score 22      Timed Up and Go Test   Normal TUG (seconds) 12.38    Cognitive TUG (seconds) 15.84                           [facility] Adult PT Treatment/Exercise - 02/23/21 1541       Neuro Re-ed    Neuro Re-ed Details  At countertop with wide BOS performing trunk rotation across body and grabbing cone from therapist and then stepping posterior/laterally and extending arm and placing cone on table x10 reps each  side. Put colorful floor dot as visual cue on where to step. With black tband around pt's pelvis performed random perturbations to ellicit a single step in the posterior direction, x12 reps UE support > none. then performed in lateral direction x8 reps each side.                     PT Education - 02/23/21 1622     Education Details Results of goals. Handout on local PD resources.    Person(s) Educated Patient   pt's brother   Methods Explanation;Handout    Comprehension Verbalized understanding              PT Short Term Goals - 02/05/21 1548       PT SHORT TERM GOAL #1   Title Pt will be independent with initial HEP in order to build upon functional gains made in threrapy. ALL STGS DUE 02/06/21    Baseline Pt still needs some cuing with HEP. Sister has been helping her but pt has not been performing all exercises. They are going to also start going to gym to walk on track.    Time 4    Period Weeks    Status Partially Met    Target Date 02/06/21      PT SHORT TERM GOAL #2   Title Pt will decr 5x sit <> stand with no UE support to 18.5 seconds or less in order to decr fall risk/improve functional BLE strength.    Baseline 21.50 seconds. 02/05/21 19.35 sec    Time 4    Period Weeks    Status Partially Met      PT SHORT TERM GOAL #3   Title Pt will decr manual TUG to 13 seconds or less in order to demo decr fall risk.    Baseline 14.75 seconds; 13.97 seconds on 02/02/21    Time 4    Period Weeks    Status Not Met      PT SHORT TERM GOAL #4   Title Pt and pt's family will verbalize understanding of fall prevention in the home.    Baseline handout provided on 02/02/21    Time 4    Period Weeks    Status Achieved               PT Long Term Goals - 02/23/21 1551       PT LONG TERM GOAL #1   Title Pt will be independent with final HEP in order to  build upon functional gains made in threrapy. ALL LTGS DUE 03/06/21    Time 8    Period Weeks    Status New       PT LONG TERM GOAL #2   Title Pt will decr 5x sit <> stand with no UE support to 16.5 seconds or less in order to decr fall risk/improve functional BLE strength.    Baseline 21.5 seconds    Time 8    Period Weeks    Status New      PT LONG TERM GOAL #3   Title Pt will verbalize understanding of local Parkinson's disease resources.    Baseline gave handout on 02/23/21    Time 8    Period Weeks    Status Achieved      PT LONG TERM GOAL #4   Title Pt will improve miniBEST score to at least a 24/28 in order to demo decr fall risk.    Baseline 21/28, 22/28 on 02/23/21    Time 8    Period Weeks    Status Not Met      PT LONG TERM GOAL #5   Title Pt will recover posterior balance in push and release test in 1 step independently, for improved balance recovery    Baseline 2 small steps on 02/23/21    Time 8    Period Weeks    Status Not Met                   Plan - 02/23/21 1624     Clinical Impression Statement Due to co-pay for therapies, pt would like to have one more visit scheduled with her sister present for review of education/HEP and then D/C. Then plan to schedule PD screens in 6 months. Began to check pt's LTGs today. Pt did not meet LTGs #4 and #5. Pt scored a 22/28 on the miniBEST (previously a 21/28). With stepping strategies in the posterior/anterior direction pt takes 2 small steps to regain her balance. Unable to elicit a stepping strategy in the L lateral direction. Remainder of session worked on perturbations with theraband to ellicit a step and for posterior stepping/reaching. Will check remainder of goals and next session and plan for D/C. Pt in agreement.    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 PMH significant for hypertension, diabetes, reflux disease, sleep apnea and borderline obesity, hx of rib fx s/p fall.     Examination-Activity Limitations Transfers;Stairs;Locomotion Level    Examination-Participation Restrictions Community Activity;Driving;Occupation    Stability/Clinical Decision Making Evolving/Moderate complexity    Rehab Potential Good    PT Frequency 2x / week    PT Duration 12 weeks    PT Treatment/Interventions ADLs/Self Care Home Management;DME Instruction;Therapeutic activities;Functional mobility training;Stair training;Gait training;Therapeutic exercise;Balance training;Neuromuscular re-education;Patient/family education;Vestibular    PT Next Visit Plan check remainder of goals. review final HEP and make sure pt got scheduled for screens.    PT Home Exercise Plan [id number]    Consulted and Agree with Plan of Care Patient;Family member/caregiver    Family Member Consulted pt's brother sebastian             Patient will benefit from skilled therapeutic intervention in order to improve the following deficits and impairments:  Abnormal gait, Decreased activity tolerance, Decreased coordination, Decreased strength, Impaired tone, Postural dysfunction, Difficulty walking, Decreased balance, Decreased safety awareness  Visit Diagnosis: Abnormal posture  Unsteadiness on feet  Other  symptoms and signs involving the nervous system     Problem List Patient Active Problem List   Diagnosis Date Noted   Decreased dorsalis pedis pulse 11/17/2020   Tachycardia 08/11/2020   Cough 08/11/2020   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   Pure hypercholesterolemia 01/23/2020   Vitamin D deficiency 10/23/2019   Right hip pain 05/15/2018   Estrogen deficiency 05/15/2018   Class 1 obesity due to excess calories with serious comorbidity and body mass index (BMI) of 31.0 to 31.9 in adult 05/15/2018   Diabetes mellitus with stage 2 chronic kidney disease ([place]) 01/11/2018   Benign hypertension with chronic kidney disease, stage II 01/11/2018    Arliss Journey, PT, DPT  02/23/2021, 4:35  PM  [facility] [address] [place], [place], [address] Phone: [phone]   Fax:  [phone]  Name: April Larson MRN: [id number] Date of Birth: 1950-08-10

## 2021-02-24 NOTE — Patient Instructions (Addendum)
Visit Information It was great speaking with you today!  Please let me know if you have any questions about our visit.   Goals Addressed             This Visit's Progress    Manage My Medicine       Timeframe:  Long-Range Goal Priority:  High Start Date:                             Expected End Date:                       Follow Up Date 07/16/2021  In Progress:   - call for medicine refill 2 or 3 days before it runs out - call if I am sick and can't take my medicine - keep a list of all the medicines I take; vitamins and herbals too - use an alarm clock or phone to remind me to take my medicine  -Patient to be onboarded Why is this important?   These steps will help you keep on track with your medicines.          Patient Care Plan: CCM Pharmacy Care Plan     Problem Identified: HTN, DM II   Priority: High     Long-Range Goal: Disease Management   Start Date: 01/13/2021  This Visit's Progress: On track  Recent Progress: On track  Priority: High  Note:   Current Barriers:  Unable to independently afford treatment regimen Unable to independently monitor therapeutic efficacy  Pharmacist Clinical Goal(s):  Patient will achieve adherence to monitoring guidelines and medication adherence to achieve therapeutic efficacy through collaboration with PharmD and provider.   Interventions: 1:1 collaboration with Glendale Chard, MD regarding development and update of comprehensive plan of care as evidenced by provider attestation and co-signature Inter-disciplinary care team collaboration (see longitudinal plan of care) Comprehensive medication review performed; medication list updated in electronic medical record  Hypertension (BP goal <130/80) -Controlled -Current treatment: Telmisartan- Hydrochlorothiazide 40-12.5 mg tablet once per day  -Current home readings: 137/80  -Current dietary habits: she is not eating any fried foods. She is eating more baked chicken and  broccoli. Patient reports using Mrs. Dash and no longer using the accent for her food.  -Current exercise habits: she is in physical therapy and was told that she should be walking around the whole gym 5 times twice per day and she is still walking her dog.  -Denies hypotensive/hypertensive symptoms -Educated on BP goals and benefits of medications for prevention of heart attack, stroke and kidney damage; Exercise goal of 150 minutes per week; Importance of home blood pressure monitoring; Proper BP monitoring technique; -Counseled to monitor BP at home at least every other day, document, and provide log at future appointments -Recommended to continue current medication  Diabetes (A1c goal <7%) -Controlled -Current medications: Xultophy - 25 units daily Patient receiving Extra Help through Medicare currently and it is $9.85/- for a 3 month supply  Metformin 500 mg tablet once per day  Actos 15 mg tablet once per day  -Current home glucose readings: she is currently checking her BS twice per day fasting glucose: 80-100 post prandial glucose: 125-130 -Denies hypoglycemic/hyperglycemic symptoms -Current meal patterns: patient reports that her eating habits are good  drinks: she is drinking 4 bottles of water per - 16 ounces per day.  -Current exercise habits: she is in physical therapy and was told that  she should be walking around the whole gym 5 times twice per day and she is still walking her dog.  -Congratulated patient on achieving her A1C goals and we discussed maintaining these levels  -Educated on Complications of diabetes including kidney damage, retinal damage, and cardiovascular disease; Benefits of routine self-monitoring of blood sugar; Carbohydrate counting and/or plate method -Counseled to check feet daily and get yearly eye exams -Recommend patients Actos be discontinued, collaborated with PCP who agrees, and advised to use a step down method.  -Patient is concerned about  the cost of the medication due to it being out network next year and costing 35 dollars per month.   -Currently applying for patient assistance for Xultophy for next year recommended patient receive refill of medication for 90 day supply  -Patient to take one tablet Monday, Wednesday and Friday until she runs out of medications  -Recommended to continue current medication -Health concierge to follow up on [place] application for 5300   Patient Goals/Self-Care Activities Patient will:  - take medications as prescribed as evidenced by patient report and record review  Follow Up Plan: The patient has been provided with contact information for the care management team and has been advised to call with any health related questions or concerns.       Patient agreed to services and verbal consent obtained.   The patient verbalized understanding of instructions, educational materials, and care plan provided today and agreed to receive a mailed copy of patient instructions, educational materials, and care plan.   Orlando Penner, PharmD Clinical Pharmacist [facility] [phone]

## 2021-02-25 ENCOUNTER — Ambulatory Visit: Payer: Medicare Other

## 2021-02-25 NOTE — Telephone Encounter (Signed)
Home health referral sent to [facility] (formerly [facility]). They are unable to accept due to patient's insurance and staffing at this time. Angela w/ [facility] advises she will try to get office to accept referral next week when there will be more spots open.  [facility] has also declined referral.  

## 2021-02-25 NOTE — Chronic Care Management (AMB) (Signed)
  Care Management   Note  02/25/2021 Name: April Larson MRN: [id number] DOB: 03/19/1951  April Larson is a 70 y.o. year old female who is a primary care patient of Glendale Chard, MD and is actively engaged with the care management team. I reached out to April Larson by phone today to assist with re-scheduling a follow up visit with the RN Case Manager  Follow up plan: Telephone appointment with care management team member scheduled for:04/10/21  [facility] Management  Direct Dial: [phone]

## 2021-02-27 ENCOUNTER — Other Ambulatory Visit: Payer: Self-pay | Admitting: Internal Medicine

## 2021-03-03 ENCOUNTER — Ambulatory Visit: Payer: Medicare Other | Admitting: Occupational Therapy

## 2021-03-03 ENCOUNTER — Ambulatory Visit: Payer: Medicare Other

## 2021-03-04 DIAGNOSIS — I129 Hypertensive chronic kidney disease with stage 1 through stage 4 chronic kidney disease, or unspecified chronic kidney disease: Secondary | ICD-10-CM

## 2021-03-04 DIAGNOSIS — N182 Chronic kidney disease, stage 2 (mild): Secondary | ICD-10-CM

## 2021-03-04 DIAGNOSIS — E1122 Type 2 diabetes mellitus with diabetic chronic kidney disease: Secondary | ICD-10-CM | POA: Diagnosis not present

## 2021-03-09 ENCOUNTER — Telehealth: Payer: Self-pay | Admitting: *Deleted

## 2021-03-09 NOTE — Chronic Care Management (AMB) (Signed)
  Care Management   Note  03/09/2021 Name: [name] MRN: [id number] DOB: [birth year]  April Larson is a 70 y.o. year old female who is a primary care patient of Glendale Chard, MD and is actively engaged with the care management team. I reached out to [name] by phone today to assist with returning voicemail that was left for care guide to return call to patient.  Follow up plan: Unsuccessful telephone outreach attempt made. A HIPAA compliant phone message was left for the patient providing contact information and requesting a return call.  The care management team will reach out to the patient again over the next 7 days.  If patient returns call to provider office, please advise to call [facility] at [phone].  [facility] Management  Direct Dial: [phone]

## 2021-03-12 NOTE — Telephone Encounter (Signed)
Angie w/ [facility] reached out to me to let me know that they were able to accept patient's referral and will begin [facility] next week.

## 2021-03-16 NOTE — Chronic Care Management (AMB) (Signed)
  Care Management   Note  03/16/2021 Name: April Larson MRN: [id number] DOB: 1950/09/01  April Larson is a 70 y.o. year old female who is a primary care patient of Glendale Chard, MD and is actively engaged with the care management team. I reached out to April Larson by phone today to assist with scheduling a follow up visit with the Pharmacist  Follow up plan: Telephone appointment with care management team member scheduled for:03/17/21  [place] Management  Direct Dial: [phone]

## 2021-03-17 ENCOUNTER — Telehealth: Payer: Medicare Other

## 2021-03-17 ENCOUNTER — Telehealth: Payer: Self-pay

## 2021-03-17 DIAGNOSIS — R059 Cough, unspecified: Secondary | ICD-10-CM | POA: Diagnosis not present

## 2021-03-17 DIAGNOSIS — E7800 Pure hypercholesterolemia, unspecified: Secondary | ICD-10-CM | POA: Diagnosis not present

## 2021-03-17 DIAGNOSIS — R0989 Other specified symptoms and signs involving the circulatory and respiratory systems: Secondary | ICD-10-CM | POA: Diagnosis not present

## 2021-03-17 DIAGNOSIS — E559 Vitamin D deficiency, unspecified: Secondary | ICD-10-CM | POA: Diagnosis not present

## 2021-03-17 DIAGNOSIS — I129 Hypertensive chronic kidney disease with stage 1 through stage 4 chronic kidney disease, or unspecified chronic kidney disease: Secondary | ICD-10-CM | POA: Diagnosis not present

## 2021-03-17 DIAGNOSIS — R000 Tachycardia, unspecified: Secondary | ICD-10-CM | POA: Diagnosis not present

## 2021-03-17 NOTE — Chronic Care Management (AMB) (Signed)
Chronic Care Management Pharmacy Assistant   Name: April Larson  MRN: [id number] DOB: 06-25-1950  Reason for Encounter: Disease State/ Diabetes  Recent office visits:  None  Recent consult visits:  02-23-2021 Ellen Henri, CCC-SLP (Speech pathology). Speech therapy for parkinson's.  02-23-2021 Colonel Bald, OT (Occupational therapy). Occupational therapy for abnormal posture.  02-23-2021 Drake Leach, PT (Physical therapy). Physical therapy for abnormal posture.  Hospital visits:  None in previous 6 months  Medications: Outpatient Encounter Medications as of 03/17/2021  Medication Sig Note   ACCU-CHEK FASTCLIX LANCETS MISC USE TO CHECK BLOOD SUGARS TWICE DAILY    ACCU-CHEK GUIDE test strip USE TO CHECK BLOOD SUGARS TWICE DAILY AS DIRECTED    aspirin EC 81 MG tablet Take 1 tablet (81 mg total) by mouth daily.    B-D ULTRAFINE III SHORT PEN 31G X 8 MM MISC USE AS DIRECTED TWICE DAILY    Biotin 10 MG CAPS Take 1 tablet by mouth daily.    Blood Glucose Monitoring Suppl (ACCU-CHEK GUIDE ME) w/Device KIT USE TO CHECK BLOOD GLUCOSE TWICE DAILY    calcium carbonate (OSCAL) 1500 (600 Ca) MG TABS tablet Take 1 tablet (1,500 mg total) by mouth daily with breakfast.    carbidopa-levodopa (SINEMET IR) 25-100 MG tablet Take 1 tablet by mouth 3 (three) times daily. Follow titration instructions provided separately in writing.    Cholecalciferol 125 MCG (5000 UT) TABS Take 1 tablet (5,000 Units total) by mouth daily.    diclofenac Sodium (VOLTAREN) 1 % GEL APPLY 2 GRAMS TOPICALLY TO THE AFFECTED AREA FOUR TIMES DAILY    fluticasone (FLONASE) 50 MCG/ACT nasal spray SHAKE LIQUID AND USE 2 SPRAYS IN EACH NOSTRIL DAILY    LIVALO 4 MG TABS TAKE 1 TABLET BY MOUTH DAILY    loratadine (CLARITIN) 10 MG tablet Take 1 tablet by mouth daily    metFORMIN (GLUCOPHAGE) 500 MG tablet Take 1 tablet (500 mg total) by mouth daily with breakfast. Dinner    Multiple  Vitamins-Minerals (ONE-A-DAY WOMENS 50+ ADVANTAGE) TABS Take 1 tablet by mouth daily    NON FORMULARY 2 each. Calm 08/13/2020: Taking 2 at night before bed.    Omega-3 Fatty Acids (FISH OIL) 1000 MG CAPS Take 1 capsule by mouth daily    omeprazole (PRILOSEC) 40 MG capsule TAKE 1 CAPSULE BY MOUTH EVERY DAY BEFORE A MEAL    pioglitazone (ACTOS) 15 MG tablet Take 1 tablet (15 mg total) by mouth daily.    telmisartan-hydrochlorothiazide (MICARDIS HCT) 40-12.5 MG tablet Take 1 tablet by mouth daily.    Turmeric 500 MG CAPS Take 1 capsule by mouth 2 (two) times daily.    vitamin C (ASCORBIC ACID) 500 MG tablet Take 1 tablet (500 mg total) by mouth daily.    XULTOPHY 100-3.6 UNIT-MG/ML SOPN INJECT 25 UNITS SUBCUTANEOUSLY DAILY    No facility-administered encounter medications on file as of 03/17/2021.  Recent Relevant Labs: Lab Results  Component Value Date/Time   HGBA1C 6.8 (H) 02/16/2021 05:06 PM   HGBA1C 7.0 (H) 11/10/2020 12:16 PM   MICROALBUR 10 11/10/2020 01:13 PM   MICROALBUR 10 10/23/2019 03:42 PM    Kidney Function Lab Results  Component Value Date/Time   CREATININE 0.96 02/16/2021 05:06 PM   CREATININE CANCELED 11/10/2020 12:16 PM   GFRNONAA 65 04/15/2020 04:07 PM   GFRNONAA 59 (L) 03/27/2020 02:24 AM   GFRAA 75 04/15/2020 04:07 PM    Current antihyperglycemic regimen:  Xultophy - 25 units daily  Metformin 500 mg daily  Actos 15 mg daily  What recent interventions/DTPs have been made to improve glycemic control:  Educated on Complications of diabetes including kidney damage, retinal damage, and cardiovascular disease; Benefits of routine self-monitoring of blood sugar; Carbohydrate counting and/or plate method -Counseled to check feet daily and get yearly eye exams -Recommend patients Actos be discontinued, collaborated with PCP who agrees, and advised to use a step down method.   Have there been any recent hospitalizations or ED visits since last visit with CPP?  No  Patient denies hypoglycemic symptoms  Patient denies hyperglycemic symptoms  How often are you checking your blood sugar? once daily  What are your blood sugars ranging?  Fasting: 104, 85, 84 Before meals: None After meals: None Bedtime: None  During the week, how often does your blood glucose drop below 70? Never  Are you checking your feet daily/regularly? Patient stated daily  Adherence Review: Is the patient currently on a STATIN medication? No Is the patient currently on ACE/ARB medication? Yes Does the patient have >5 day gap between last estimated fill dates? No  NOTE: Initiated 2023 patient assistance application for Xultophy to be mailed.  Care Gaps: Covid booster overdue AWV 07-01-2021  Star Rating Drugs: Xultophy 100-3.6 unit- Last filled 03-03-2021 82 DS [facility] Metformin 500 mg- Last filled 01-16-2021 90 DS [facility] Telmisartan-HCTZ 40-12.5 mg- Last filled 12-23-2020 90 DS [place] Clinical Pharmacist Assistant [phone]

## 2021-03-20 ENCOUNTER — Encounter: Payer: Medicare Other | Admitting: Occupational Therapy

## 2021-03-20 ENCOUNTER — Ambulatory Visit: Payer: Medicare Other

## 2021-03-21 DIAGNOSIS — R000 Tachycardia, unspecified: Secondary | ICD-10-CM | POA: Diagnosis not present

## 2021-03-21 DIAGNOSIS — E559 Vitamin D deficiency, unspecified: Secondary | ICD-10-CM | POA: Diagnosis not present

## 2021-03-21 DIAGNOSIS — E7800 Pure hypercholesterolemia, unspecified: Secondary | ICD-10-CM | POA: Diagnosis not present

## 2021-03-21 DIAGNOSIS — R059 Cough, unspecified: Secondary | ICD-10-CM | POA: Diagnosis not present

## 2021-03-21 DIAGNOSIS — R0989 Other specified symptoms and signs involving the circulatory and respiratory systems: Secondary | ICD-10-CM | POA: Diagnosis not present

## 2021-03-21 DIAGNOSIS — I129 Hypertensive chronic kidney disease with stage 1 through stage 4 chronic kidney disease, or unspecified chronic kidney disease: Secondary | ICD-10-CM | POA: Diagnosis not present

## 2021-03-23 ENCOUNTER — Telehealth: Payer: Self-pay

## 2021-03-23 DIAGNOSIS — R000 Tachycardia, unspecified: Secondary | ICD-10-CM | POA: Diagnosis not present

## 2021-03-23 DIAGNOSIS — R0989 Other specified symptoms and signs involving the circulatory and respiratory systems: Secondary | ICD-10-CM | POA: Diagnosis not present

## 2021-03-23 DIAGNOSIS — E559 Vitamin D deficiency, unspecified: Secondary | ICD-10-CM | POA: Diagnosis not present

## 2021-03-23 DIAGNOSIS — E7800 Pure hypercholesterolemia, unspecified: Secondary | ICD-10-CM | POA: Diagnosis not present

## 2021-03-23 DIAGNOSIS — I129 Hypertensive chronic kidney disease with stage 1 through stage 4 chronic kidney disease, or unspecified chronic kidney disease: Secondary | ICD-10-CM | POA: Diagnosis not present

## 2021-03-23 DIAGNOSIS — R059 Cough, unspecified: Secondary | ICD-10-CM | POA: Diagnosis not present

## 2021-03-23 NOTE — Telephone Encounter (Signed)
Zacharia OT with [facility] called requesting approval for OT home health services, one time a week for 5 weeks and an order for grab bars and shower chair. [phone]  I returned her call and gave the verbal orders okay per Dr[facility]

## 2021-03-24 DIAGNOSIS — E559 Vitamin D deficiency, unspecified: Secondary | ICD-10-CM | POA: Diagnosis not present

## 2021-03-24 DIAGNOSIS — E7800 Pure hypercholesterolemia, unspecified: Secondary | ICD-10-CM | POA: Diagnosis not present

## 2021-03-24 DIAGNOSIS — I129 Hypertensive chronic kidney disease with stage 1 through stage 4 chronic kidney disease, or unspecified chronic kidney disease: Secondary | ICD-10-CM | POA: Diagnosis not present

## 2021-03-24 DIAGNOSIS — R0989 Other specified symptoms and signs involving the circulatory and respiratory systems: Secondary | ICD-10-CM | POA: Diagnosis not present

## 2021-03-24 DIAGNOSIS — R000 Tachycardia, unspecified: Secondary | ICD-10-CM | POA: Diagnosis not present

## 2021-03-24 DIAGNOSIS — R059 Cough, unspecified: Secondary | ICD-10-CM | POA: Diagnosis not present

## 2021-03-26 ENCOUNTER — Ambulatory Visit: Payer: Medicare Other | Admitting: Neurology

## 2021-04-03 ENCOUNTER — Other Ambulatory Visit: Payer: Self-pay

## 2021-04-03 ENCOUNTER — Telehealth: Payer: Self-pay

## 2021-04-03 DIAGNOSIS — R000 Tachycardia, unspecified: Secondary | ICD-10-CM | POA: Diagnosis not present

## 2021-04-03 DIAGNOSIS — R059 Cough, unspecified: Secondary | ICD-10-CM | POA: Diagnosis not present

## 2021-04-03 DIAGNOSIS — E559 Vitamin D deficiency, unspecified: Secondary | ICD-10-CM | POA: Diagnosis not present

## 2021-04-03 DIAGNOSIS — E7800 Pure hypercholesterolemia, unspecified: Secondary | ICD-10-CM | POA: Diagnosis not present

## 2021-04-03 DIAGNOSIS — R0989 Other specified symptoms and signs involving the circulatory and respiratory systems: Secondary | ICD-10-CM | POA: Diagnosis not present

## 2021-04-03 DIAGNOSIS — I129 Hypertensive chronic kidney disease with stage 1 through stage 4 chronic kidney disease, or unspecified chronic kidney disease: Secondary | ICD-10-CM | POA: Diagnosis not present

## 2021-04-03 NOTE — Progress Notes (Signed)
Chronic Care Management Pharmacy Assistant   Name: Negin Vilikia Watkins [place]  MRN: [id number] DOB: 1950-10-12   Reason for Encounter: Medication Review/ Medication Coordination Call    Recent office visits: None   Recent consult visits: None   Hospital visits:  None in previous 6 months  Medications: Outpatient Encounter Medications as of 04/03/2021  Medication Sig Note   ACCU-CHEK FASTCLIX LANCETS MISC USE TO CHECK BLOOD SUGARS TWICE DAILY    ACCU-CHEK GUIDE test strip USE TO CHECK BLOOD SUGARS TWICE DAILY AS DIRECTED    aspirin EC 81 MG tablet Take 1 tablet (81 mg total) by mouth daily.    B-D ULTRAFINE III SHORT PEN 31G X 8 MM MISC USE AS DIRECTED TWICE DAILY    Biotin 10 MG CAPS Take 1 tablet by mouth daily.    Blood Glucose Monitoring Suppl (ACCU-CHEK GUIDE ME) w/Device KIT USE TO CHECK BLOOD GLUCOSE TWICE DAILY    calcium carbonate (OSCAL) 1500 (600 Ca) MG TABS tablet Take 1 tablet (1,500 mg total) by mouth daily with breakfast.    carbidopa-levodopa (SINEMET IR) 25-100 MG tablet Take 1 tablet by mouth 3 (three) times daily. Follow titration instructions provided separately in writing.    Cholecalciferol 125 MCG (5000 UT) TABS Take 1 tablet (5,000 Units total) by mouth daily.    diclofenac Sodium (VOLTAREN) 1 % GEL APPLY 2 GRAMS TOPICALLY TO THE AFFECTED AREA FOUR TIMES DAILY    fluticasone (FLONASE) 50 MCG/ACT nasal spray SHAKE LIQUID AND USE 2 SPRAYS IN EACH NOSTRIL DAILY    LIVALO 4 MG TABS TAKE 1 TABLET BY MOUTH DAILY    loratadine (CLARITIN) 10 MG tablet Take 1 tablet by mouth daily    metFORMIN (GLUCOPHAGE) 500 MG tablet Take 1 tablet (500 mg total) by mouth daily with breakfast. Dinner    Multiple Vitamins-Minerals (ONE-A-DAY WOMENS 50+ ADVANTAGE) TABS Take 1 tablet by mouth daily    NON FORMULARY 2 each. Calm 08/13/2020: Taking 2 at night before bed.    Omega-3 Fatty Acids (FISH OIL) 1000 MG CAPS Take 1 capsule by mouth daily    omeprazole (PRILOSEC) 40 MG  capsule TAKE 1 CAPSULE BY MOUTH EVERY DAY BEFORE A MEAL    pioglitazone (ACTOS) 15 MG tablet Take 1 tablet (15 mg total) by mouth daily.    telmisartan-hydrochlorothiazide (MICARDIS HCT) 40-12.5 MG tablet Take 1 tablet by mouth daily.    Turmeric 500 MG CAPS Take 1 capsule by mouth 2 (two) times daily.    vitamin C (ASCORBIC ACID) 500 MG tablet Take 1 tablet (500 mg total) by mouth daily.    XULTOPHY 100-3.6 UNIT-MG/ML SOPN INJECT 25 UNITS SUBCUTANEOUSLY DAILY    No facility-administered encounter medications on file as of 04/03/2021.   Reviewed chart for medication changes ahead of medication coordination call.  No OVs, Consults, or hospital visits since last care coordination call/Pharmacist visit.  No medication changes indicated OR if recent visit, treatment plan here.  BP Readings from Last 3 Encounters:  02/16/21 136/78  01/20/21 124/68  01/14/21 130/82    Lab Results  Component Value Date   HGBA1C 6.8 (H) 02/16/2021     Patient obtains medications through Adherence Packaging  90 Days   Last adherence delivery included:  Livalo 4 mg- 1 tablet daily (Bedtime) 02/16/21 for 56 DS Carbidopa-Levodopa 25-100 mg- 1 tablet three times daily( Before breakfast, Lunch and Evening meal) 03/20/2021 for 24 DS Fluticasone NS- 2 sprays each nostril daily 03/20/2021 for 30 DS  Patient  new  to [facility] as of 02/2021   Patient is due for next adherence delivery on: 04/14/2021  Called patient and reviewed medications and coordinated delivery.  This delivery to include:  Fluticasone NS- 2 sprays each nostril daily  Omeprazole 40 mg- 1 tablet daily( breakfast) Pioglitazone 15 mg- 1 tablet daily (Bedtime)  Livalo 4 mg- 1 tablet daily (Bedtime) Metformin 500 mg- 1 tablet daily (Bedtime) One-a Day Womens Multivitamin- 1 tablet daily (Breakfast) Vitamin D3 5, 000 units- 1 tablet daily (Bedtime) Tumeric 1000 mg- 1 tablet daily (Breakfast) Asa 81 mg- 1 tablet daily  (Breakfast) Loratadine 10 mg- 1 tablet daily (Breakfast) Vitamin C 1000 mg- 1 tablet daily (Breakfast) Omega 3 Fish Oil- 1 tablet daily (Bedtime) Biotin 10 mg- 1 tablet daily (Breakfast) Calm gummies- 2 at night (Bedtime)    No short fill needed  Coordinated acute  future fill for Temisartan/HCTZ 40-12.5 mg to be delivered 06/26/2021 for short supply until next adherence refill.  Patient declined the following medications: Temisartan/HCTZ 40-12.5 mg- 1 tablet daily (Breakfast) due to receiving at [facility] on 04/01/2021 for 90 DS     Patient needs refills for Omeprazole 40 mg- 1 tablet daily request sent to [facility].  Confirmed delivery date of 04/14/2021, advised patient that pharmacy will contact them the morning of delivery.   Care Gaps:  Covid booster overdue AWV 07-01-2021   Star Rating Drugs:  Xultophy 100-3.6 unit- Last filled 03-03-2021 82 DS [facility] Patient assistance Metformin 500 mg- Last filled 01-16-2021 90 DS [facility] Telmisartan-HCTZ 40-12.5 mg- previous filled 12/23/20 and last fill 04/01/2021 90 DS [place] Clinical Pharmacist Assistant [phone]

## 2021-04-07 ENCOUNTER — Telehealth: Payer: Self-pay

## 2021-04-07 ENCOUNTER — Other Ambulatory Visit: Payer: Self-pay | Admitting: Internal Medicine

## 2021-04-07 DIAGNOSIS — R471 Dysarthria and anarthria: Secondary | ICD-10-CM | POA: Diagnosis not present

## 2021-04-07 DIAGNOSIS — E7800 Pure hypercholesterolemia, unspecified: Secondary | ICD-10-CM | POA: Diagnosis not present

## 2021-04-07 DIAGNOSIS — I129 Hypertensive chronic kidney disease with stage 1 through stage 4 chronic kidney disease, or unspecified chronic kidney disease: Secondary | ICD-10-CM | POA: Diagnosis not present

## 2021-04-07 DIAGNOSIS — E1122 Type 2 diabetes mellitus with diabetic chronic kidney disease: Secondary | ICD-10-CM | POA: Diagnosis not present

## 2021-04-07 DIAGNOSIS — N182 Chronic kidney disease, stage 2 (mild): Secondary | ICD-10-CM | POA: Diagnosis not present

## 2021-04-07 DIAGNOSIS — G20 Parkinson's disease: Secondary | ICD-10-CM | POA: Diagnosis not present

## 2021-04-07 MED ORDER — OMEPRAZOLE 40 MG PO CPDR: DELAYED_RELEASE_CAPSULE | ORAL | 2 refills | Status: DC

## 2021-04-07 NOTE — Progress Notes (Signed)
° ° °  Chronic Care Management Pharmacy Assistant   Name: April Larson  MRN: [id number] DOB: Aug 30, 1950  Reason for Encounter: Called patient to reschedule appointment with CPP on 07/16/2021 to 07/17/2021 because CPP schedule changed patient confirmed.     [place] Pharmacist Assistant [phone]

## 2021-04-08 DIAGNOSIS — B351 Tinea unguium: Secondary | ICD-10-CM | POA: Diagnosis not present

## 2021-04-08 DIAGNOSIS — G20 Parkinson's disease: Secondary | ICD-10-CM | POA: Diagnosis not present

## 2021-04-08 DIAGNOSIS — N182 Chronic kidney disease, stage 2 (mild): Secondary | ICD-10-CM | POA: Diagnosis not present

## 2021-04-08 DIAGNOSIS — E1122 Type 2 diabetes mellitus with diabetic chronic kidney disease: Secondary | ICD-10-CM | POA: Diagnosis not present

## 2021-04-08 DIAGNOSIS — E7800 Pure hypercholesterolemia, unspecified: Secondary | ICD-10-CM | POA: Diagnosis not present

## 2021-04-08 DIAGNOSIS — M2011 Hallux valgus (acquired), right foot: Secondary | ICD-10-CM | POA: Diagnosis not present

## 2021-04-08 DIAGNOSIS — I129 Hypertensive chronic kidney disease with stage 1 through stage 4 chronic kidney disease, or unspecified chronic kidney disease: Secondary | ICD-10-CM | POA: Diagnosis not present

## 2021-04-08 DIAGNOSIS — R471 Dysarthria and anarthria: Secondary | ICD-10-CM | POA: Diagnosis not present

## 2021-04-08 DIAGNOSIS — E1142 Type 2 diabetes mellitus with diabetic polyneuropathy: Secondary | ICD-10-CM | POA: Diagnosis not present

## 2021-04-09 ENCOUNTER — Encounter: Payer: Self-pay | Admitting: Neurology

## 2021-04-09 ENCOUNTER — Ambulatory Visit: Payer: Medicare Other | Admitting: Neurology

## 2021-04-09 VITALS — BP 145/79 | HR 84 | Ht 61.0 in | Wt 171.2 lb

## 2021-04-09 DIAGNOSIS — G20 Parkinson's disease: Secondary | ICD-10-CM

## 2021-04-09 NOTE — Progress Notes (Signed)
Subjective:    Patient ID: April Larson is a 71 y.o. female.  HPI    Interim history:   April Larson is a 71 year old right-handed woman with an underlying medical history of hypertension, diabetes, reflux disease, allergies, sleep apnea and borderline obesity, who presents for follow-up consultation of her parkinsonism.  She is accompanied by her sister, [name], again today. I last saw her on 12/24/2020, at which time she felt fairly stable, had no recent falls.  She was still driving but limiting herself to daytime driving and familiar routes.  She was noted to be more forgetful and have a tendency to repeat herself per her sister's observation.  I made a referral to neuro rehab.  She was advised to start Sinemet gradual titration.  Subsequently, we requested home health therapy  Today, 04/09/2021: She reports feeling fairly stable, no telltale results from taking the Sinemet which is currently 1 pill 3 times daily at 7:30 AM, 12 or 12:30 PM and 6 PM daily.  Her sister feels that her mobility has improved a little bit.  Patient just finished home health physical therapy and prior to that she had outpatient therapy through neuro rehab.  She was given quite a few handouts to exercise at home.  She is also thinking about enrolling in a boxing class at the senior center.  She has not fallen thankfully.  Sister feels that her cognitive abilities are stable but she was hoping to see an improvement on the Sinemet.  We talked about the expectations on medications such as Sinemet again today.  Patient reports that she is managing fairly well, she is slower in processing and manages her household okay but is just slower, sometimes she needs more time to focus but has not had any serious problems such as forgetting something on the stove or not locking the door.  She still drives but has limited her driving.  She works at [employer], she has had less responsibilities and manages her job just  fine at this time.   Previously (copied from previous notes for reference):   I saw her on 07/23/2020 at the request of her primary care physician, at which time she reported a several month history of difficulty with her balance, recent fall, problems with fine motor control and speech.   She had a DaTscan on 12/19/2020 and I reviewed the results: IMPRESSION: Decreased radiotracer activity within the striata with greater deficit on the LEFT. Findings are suggestive of Parkinsonian syndrome pathology.   Of note, DaTSCAN is not diagnostic of Parkinsonian syndromes, which remains a clinical diagnosis. DaTscan is an adjuvant test to aid in the clinical diagnosis of Parkinsonian syndromes.   We called her with her test results.     07/23/20: (She) reports a 3+ month history of difficulty with her balance, recent fall with head injury, feeling wobbly, feeling like she could fall again, problems with fine motor control, slowness, change in her speech.  Her sister reports that she has noticed changes in patient's behavior and that she seems to be less attentive at times, seems to zone out.  She has not had any passing out spells or convulsions or twitching or involuntary movements, no tremors.  However, she did take a significant fall.  Patient reports that she fell when she was working at [employer].  This was on 03/27/2020.  She was in the process of knocking at the door where the ushers are and the next thing she realized was  that she was on the floor.  She did not have any warning sign, no lightheadedness, no spinning sensation, no headache.  She was assisted by a nurse that was onsite.  She was taken to the emergency room.  I reviewed the emergency room record from 03/27/2020.  She went to [facility] ED.   She did sustain a rib fracture of the left 10th rib.  She reports that after she had her rib fracture, she stopped using her CPAP but otherwise she has been compliant with it.  After about  a month she resumed using her CPAP.  She has not had any difficulty swallowing but has noticed changes in her speech.  Her sister reports that she seems to speak very deliberately and slowly and also eats slowly.  Patient reports that her handwriting has changed.  It has become much more tremulous and sloppy.  She is to have a good penmanship.   I reviewed your office note from 04/15/2020.  You ordered a brain MRI.   She had a brain MRI with and without contrast on 06/07/2020 and I reviewed the results: IMPRESSION: No evidence of recent infarction, hemorrhage, or mass. No abnormal enhancement.   Mild chronic microvascular ischemic changes.    She had recent labs through your office on 04/15/2020.  Vitamin B12 was 1323, TSH was 1.83, urinalysis negative, CMP showed a glucose of 90, BUN 22, creatinine 0.91, alk phos was elevated at 196, AST and ALT normal.  A1c was mildly elevated at 7.1.  CBC showed WBC at 1.1, hemoglobin 12.4, hematocrit 37.7, platelets mildly elevated at 480.   She had a head CT without contrast on 03/27/2020 and I reviewed the results: IMPRESSION: 1. Left frontal scalp soft tissue swelling without underlying fracture. 2. No acute intracranial abnormality. 3. Mild generalized atrophy and white matter disease likely reflects the sequela of chronic microvascular ischemia.   I previously evaluated her over 2 years ago for sleep apnea.  She had a baseline sleep study on 04/10/2018 which showed an AHI of 12.3/h, REM AHI of 24.3/h, O2 nadir 88%.  She had a CPAP titration study on 06/05/2018 at which time she was recommended to start CPAP at 9 cm of water pressure.   She did not return for follow-up.  She has been on CPAP.  Current 30-day compliance showed excellent compliance with an average usage of 5 hours and 48 minutes, residual AHI at goal at 3.3/h, leak on the low side, pressure at 9 cm.  Percent use days greater than 4 hours at 90%.    03/09/18: April Larson is a 71 year old  right-handed woman with an underlying medical history of type 2 diabetes, chronic kidney disease, hypertension, seasonal allergies, and obesity, who was previously diagnosed with obstructive sleep apnea. She was placed on CPAP therapy. She has not had re-evaluation in years. Prior sleep study results are not available for my review today. I reviewed your office note from 01/12/2018. She was complaining of memory loss. she had blood work through your office on 01/12/2018, including RPR, B12, lipid profile, A1c, CMP, TSH. I reviewed the test results. Blood tests were benign, A1c 6.5. Her Epworth sleepiness score is 16 out of 24, fatigue score is 42 out of 63. She reports that she could not tolerate CPAP in the past. She reports that she had a sinus infection after she started using CPAP. She estimates that this was about 15 years ago. She is widowed; sadly, her husband passed away in  January 2019. She lives alone, no children, has 1 dog in the household, he sleeps on the bed with her. She is retired. She quit smoking in 2004, does not utilize alcohol and does not drink caffeine on a regular basis. Her bedtime is around 11:30 and she is typically asleep at midnight. She does have a TV on in her bedroom which tends to stay on all night. Rise time is around 6:50 AM. She has nocturia about once per average night and denies recurrent morning headaches. She is not aware of any family history of OSA. She typically takes a nap for an hour around 4 PM daily. She tried a fullface mask in the past for her CPAP machine and it was uncomfortable. She would be willing to get retested and consider CPAP therapy again. She has had some short-term memory issues.     Her Past Medical History Is Significant For: Past Medical History:  Diagnosis Date   Diabetes mellitus without complication ([place])    Hypertension    Substance abuse ([place])     Her Past Surgical History Is Significant For: Past Surgical History:  Procedure  Laterality Date   EYE SURGERY      Her Family History Is Significant For: Family History  Problem Relation Age of Onset   Cancer Mother    Diabetes Father    Cancer Brother    Breast cancer Maternal Aunt    Parkinsonism Neg Hx     Her Social History Is Significant For: Social History   Socioeconomic History   Marital status: Widowed    Spouse name: Not on file   Number of children: Not on file   Years of education: Not on file   Highest education level: Not on file  Occupational History   Occupation: retired  Tobacco Use   Smoking status: Former    Packs/day: 1.00    Years: 34.00    Pack years: 34.00    Types: Cigarettes    Start date: 1970    Quit date: 04/08/2005    Years since quitting: 16.0   Smokeless tobacco: Never  Vaping Use   Vaping Use: Never used  Substance and Sexual Activity   Alcohol use: No    Alcohol/week: 0.0 standard drinks   Drug use: No   Sexual activity: Not Currently  Other Topics Concern   Not on file  Social History Narrative   Not on file   Social Determinants of Health   Financial Resource Strain: High Risk   Difficulty of Paying Living Expenses: Very hard  Food Insecurity: No Food Insecurity   Worried About Charity fundraiser in the Last Year: Never true   [place] in the Last Year: Never true  Transportation Needs: No Transportation Needs   Lack of Transportation (Medical): No   Lack of Transportation (Non-Medical): No  Physical Activity: Inactive   Days of Exercise per Week: 0 days   Minutes of Exercise per Session: 0 min  Stress: No Stress Concern Present   Feeling of Stress : Not at all  Social Connections: Not on file    Her Allergies Are:  No Known Allergies:   Her Current Medications Are:  Outpatient Encounter Medications as of 04/09/2021  Medication Sig   ACCU-CHEK FASTCLIX LANCETS MISC USE TO CHECK BLOOD SUGARS TWICE DAILY   ACCU-CHEK GUIDE test strip USE TO CHECK BLOOD SUGARS TWICE DAILY AS DIRECTED    aspirin EC 81 MG tablet Take 1 tablet (81 mg  total) by mouth daily.   B-D ULTRAFINE III SHORT PEN 31G X 8 MM MISC USE AS DIRECTED TWICE DAILY   Biotin 10 MG CAPS Take 1 tablet by mouth daily.   Blood Glucose Monitoring Suppl (ACCU-CHEK GUIDE ME) w/Device KIT USE TO CHECK BLOOD GLUCOSE TWICE DAILY   calcium carbonate (OSCAL) 1500 (600 Ca) MG TABS tablet Take 1 tablet (1,500 mg total) by mouth daily with breakfast.   carbidopa-levodopa (SINEMET IR) 25-100 MG tablet Take 1 tablet by mouth 3 (three) times daily. Follow titration instructions provided separately in writing.   Cholecalciferol 125 MCG (5000 UT) TABS Take 1 tablet (5,000 Units total) by mouth daily.   diclofenac Sodium (VOLTAREN) 1 % GEL APPLY 2 GRAMS TOPICALLY TO THE AFFECTED AREA FOUR TIMES DAILY   fluticasone (FLONASE) 50 MCG/ACT nasal spray SHAKE LIQUID AND USE 2 SPRAYS IN EACH NOSTRIL DAILY   LIVALO 4 MG TABS TAKE 1 TABLET BY MOUTH DAILY   loratadine (CLARITIN) 10 MG tablet Take 1 tablet by mouth daily   metFORMIN (GLUCOPHAGE) 500 MG tablet Take 1 tablet (500 mg total) by mouth daily with breakfast. Dinner   Multiple Vitamins-Minerals (ONE-A-DAY WOMENS 50+ ADVANTAGE) TABS Take 1 tablet by mouth daily   NON FORMULARY 2 each. Calm   Omega-3 Fatty Acids (FISH OIL) 1000 MG CAPS Take 1 capsule by mouth daily   omeprazole (PRILOSEC) 40 MG capsule TAKE 1 CAPSULE BY MOUTH EVERY DAY BEFORE A MEAL   pioglitazone (ACTOS) 15 MG tablet Take 1 tablet (15 mg total) by mouth daily.   telmisartan-hydrochlorothiazide (MICARDIS HCT) 40-12.5 MG tablet Take 1 tablet by mouth daily.   Turmeric 500 MG CAPS Take 1 capsule by mouth 2 (two) times daily.   vitamin C (ASCORBIC ACID) 500 MG tablet Take 1 tablet (500 mg total) by mouth daily.   XULTOPHY 100-3.6 UNIT-MG/ML SOPN INJECT 25 UNITS SUBCUTANEOUSLY DAILY   No facility-administered encounter medications on file as of 04/09/2021.  :  Review of Systems:  Out of a complete 14 point review of systems,  all are reviewed and negative with the exception of these symptoms as listed below:    Review of Systems  Neurological:        Pt is here for follow up with parkinson's. Pt states she went to a therapist and told patient that she thought she didn't have parkinson but the therapist thought she had  a stroke . Pt states she holds her right arm to her abdomen and doesn't put down    Objective:  Neurological Exam  Physical Exam Physical Examination:   Vitals:   04/09/21 1007  BP: (!) 145/79  Pulse: 84    General Examination: The patient is a very pleasant 71 y.o. female in no acute distress. She appears well-developed and well-nourished and well groomed.   HEENT: Normocephalic, atraumatic, pupils are equal, round and reactive to light, extraocular tracking is impaired, slow eye movements side to side but significant limitation to upgaze and downgaze, no nystagmus.  Decrease in eye blink rate noted and moderate facial masking noted.  She has moderate nuchal rigidity and decreased active range of motion in her neck.  She has slow speech, mild hypophonia at times.  She has minimal dysarthria at times.  She has no obvious sialorrhea.  Tongue protrudes centrally and palate elevates symmetrically, no carotid bruits.    Chest: Clear to auscultation without wheezing, rhonchi or crackles noted.  Heart: S1+S2+0, regular and normal without murmurs, rubs or gallops noted.  Abdomen: Soft, non-tender and non-distended.   Extremities: There is no pitting edema in the distal lower extremities bilaterally.    Skin: Warm and dry without trophic changes noted.   Musculoskeletal: exam reveals no obvious joint deformities, or tenderness.   Neurologically:  Mental status: The patient is awake, alert and oriented in all 4 spheres. Her immediate and remote memory, attention, language skills and fund of knowledge are fairly appropriate. Thought process is linear.  She has bradyphrenia, and sister provide  some details.   MMSE - Mini Mental State Exam 12/24/2020  Orientation to time 5  Orientation to Place 5  Registration 3  Attention/ Calculation 3  Recall 3  Language- name 2 objects 2  Language- repeat 1  Language- follow 3 step command 3  Language- read & follow direction 1  Write a sentence 1  Copy design 0  Total score 27      On 12/24/2020: CDT: 3/4, AFT: 9/min.   Cranial nerves II - XII are as described above under HEENT exam.  Motor exam: Normal bulk, global strength of 4+ out of 5, increase in tone in the right more than left upper extremity, no obvious cogwheeling noted.  No resting tremor noted.  Romberg is not tested due to safety concerns.  Moderate bradykinesia.  Fine motor skill testing reveals decreased ability with right-sided predominance of finger taps, and foot taps, in the moderate range, mild to moderate impairment of hand movements bilaterally, mild impairment of rapid alternating patting in the bilateral hands.   Cerebellar testing: No dysmetria or intention tremor.  Sensory exam: intact to light touch.  Gait, station and balance: She stands with mild difficulty and pushes herself up.  Posture is age-appropriate to mildly stooped, stance with mildly wider base, left shoulder slightly higher than right, slight right tilt.  She has no walking aid.  She walks slowly and cautiously, mildly decreased arm swing noted right more than left, no obvious shuffling noted.    Assessment and Plan:  In summary, April Larson is a very pleasant 71 year old female with an underlying medical history of hypertension, diabetes, reflux disease, allergies, sleep apnea and borderline obesity, who presents for follow-up consultation of her dizziness, fine motor dyscontrol, feeling off balance, changes in handwriting, changes in speech and balance.  History and examination are in keeping with parkinsonism.  She had a DaTscan on 12/19/2020 which showed decreased radiotracer  activity within the striata with greater deficit on the left.  She does have a little bit of lateralization of her symptoms and clinical findings to the right.  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  She had a brain MRI which showed relatively benign findings.  We started Sinemet in September 2022.  She is currently on 1 pill 3 times daily and tolerates it.  She has not noticed any telltale results but her sister seems to think that she has had some improvement in her motor function.  She has benefited from therapy as well.  She is advised to increase her Sinemet to 1 pill 4 times daily at 7:30 AM, 11:30 AM, 3:30 PM, and 7:30 PM daily.  She is encouraged to continue with her exercise regimen and handouts.  She was given.  She is agreeable to increasing the Sinemet.  We talked about the importance of supportive treatments.  We will continue to monitor her memory and may consider memory medication in the near future.  She is advised to follow-up in this clinic in about 6 months, sooner if needed.  I answered all their questions today and the patient and her sister were in agreement. I spent 40 minutes in total face-to-face time and in reviewing records during pre-charting, more than 50% of which was spent in counseling and coordination of care, reviewing test results, reviewing medications and treatment regimen and/or in discussing or reviewing the diagnosis of parkinsonism, the prognosis and treatment options. Pertinent laboratory and imaging test results that were available during this visit with the patient were reviewed by me and considered in my medical decision making (see chart for details).

## 2021-04-10 ENCOUNTER — Telehealth: Payer: Medicare Other

## 2021-04-10 ENCOUNTER — Ambulatory Visit (INDEPENDENT_AMBULATORY_CARE_PROVIDER_SITE_OTHER): Payer: Medicare Other

## 2021-04-10 DIAGNOSIS — G20 Parkinson's disease: Secondary | ICD-10-CM | POA: Diagnosis not present

## 2021-04-10 DIAGNOSIS — E1122 Type 2 diabetes mellitus with diabetic chronic kidney disease: Secondary | ICD-10-CM

## 2021-04-10 DIAGNOSIS — E7800 Pure hypercholesterolemia, unspecified: Secondary | ICD-10-CM | POA: Diagnosis not present

## 2021-04-10 DIAGNOSIS — I129 Hypertensive chronic kidney disease with stage 1 through stage 4 chronic kidney disease, or unspecified chronic kidney disease: Secondary | ICD-10-CM | POA: Diagnosis not present

## 2021-04-10 DIAGNOSIS — N182 Chronic kidney disease, stage 2 (mild): Secondary | ICD-10-CM | POA: Diagnosis not present

## 2021-04-10 DIAGNOSIS — R471 Dysarthria and anarthria: Secondary | ICD-10-CM | POA: Diagnosis not present

## 2021-04-10 NOTE — Chronic Care Management (AMB) (Signed)
Chronic Care Management   CCM RN Visit Note  04/10/2021 Name: April Larson MRN: [id number] DOB: 22-Oct-1950  Subjective: April Larson is a 71 y.o. year old female who is a primary care patient of Glendale Chard, MD. The care management team was consulted for assistance with disease management and care coordination needs.    Engaged with patient by telephone for follow up visit in response to provider referral for case management and/or care coordination services.   Consent to Services:  The patient was given information about Chronic Care Management services, agreed to services, and gave verbal consent prior to initiation of services.  Please see initial visit note for detailed documentation.   Patient agreed to services and verbal consent obtained.   Assessment: Review of patient past medical history, allergies, medications, health status, including review of consultants reports, laboratory and other test data, was performed as part of comprehensive evaluation and provision of chronic care management services.   SDOH (Social Determinants of Health) assessments and interventions performed:  Yes, no acute needs  CCM Care Plan  No Known Allergies  Outpatient Encounter Medications as of 04/10/2021  Medication Sig Note   ACCU-CHEK FASTCLIX LANCETS MISC USE TO CHECK BLOOD SUGARS TWICE DAILY    ACCU-CHEK GUIDE test strip USE TO CHECK BLOOD SUGARS TWICE DAILY AS DIRECTED    aspirin EC 81 MG tablet Take 1 tablet (81 mg total) by mouth daily.    B-D ULTRAFINE III SHORT PEN 31G X 8 MM MISC USE AS DIRECTED TWICE DAILY    Biotin 10 MG CAPS Take 1 tablet by mouth daily.    Blood Glucose Monitoring Suppl (ACCU-CHEK GUIDE ME) w/Device KIT USE TO CHECK BLOOD GLUCOSE TWICE DAILY    calcium carbonate (OSCAL) 1500 (600 Ca) MG TABS tablet Take 1 tablet (1,500 mg total) by mouth daily with breakfast.    carbidopa-levodopa (SINEMET IR) 25-100 MG tablet Take 1 tablet by  mouth 3 (three) times daily. Follow titration instructions provided separately in writing.    Cholecalciferol 125 MCG (5000 UT) TABS Take 1 tablet (5,000 Units total) by mouth daily.    diclofenac Sodium (VOLTAREN) 1 % GEL APPLY 2 GRAMS TOPICALLY TO THE AFFECTED AREA FOUR TIMES DAILY    fluticasone (FLONASE) 50 MCG/ACT nasal spray SHAKE LIQUID AND USE 2 SPRAYS IN EACH NOSTRIL DAILY    LIVALO 4 MG TABS TAKE 1 TABLET BY MOUTH DAILY    loratadine (CLARITIN) 10 MG tablet Take 1 tablet by mouth daily    metFORMIN (GLUCOPHAGE) 500 MG tablet Take 1 tablet (500 mg total) by mouth daily with breakfast. Dinner    Multiple Vitamins-Minerals (ONE-A-DAY WOMENS 50+ ADVANTAGE) TABS Take 1 tablet by mouth daily    NON FORMULARY 2 each. Calm 08/13/2020: Taking 2 at night before bed.    Omega-3 Fatty Acids (FISH OIL) 1000 MG CAPS Take 1 capsule by mouth daily    omeprazole (PRILOSEC) 40 MG capsule TAKE 1 CAPSULE BY MOUTH EVERY DAY BEFORE A MEAL    pioglitazone (ACTOS) 15 MG tablet Take 1 tablet (15 mg total) by mouth daily.    telmisartan-hydrochlorothiazide (MICARDIS HCT) 40-12.5 MG tablet Take 1 tablet by mouth daily.    Turmeric 500 MG CAPS Take 1 capsule by mouth 2 (two) times daily.    vitamin C (ASCORBIC ACID) 500 MG tablet Take 1 tablet (500 mg total) by mouth daily.    XULTOPHY 100-3.6 UNIT-MG/ML SOPN INJECT 25 UNITS SUBCUTANEOUSLY DAILY    No facility-administered  encounter medications on file as of 04/10/2021.    Patient Active Problem List   Diagnosis Date Noted   Decreased dorsalis pedis pulse 11/17/2020   Tachycardia 08/11/2020   Cough 08/11/2020   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   Pure hypercholesterolemia 01/23/2020   Vitamin D deficiency 10/23/2019   Right hip pain 05/15/2018   Estrogen deficiency 05/15/2018   Class 1 obesity due to excess calories with serious comorbidity and body mass index (BMI) of 31.0 to 31.9 in adult 05/15/2018   Diabetes mellitus with stage 2 chronic kidney  disease ([place]) 01/11/2018   Benign hypertension with chronic kidney disease, stage II 01/11/2018    Conditions to be addressed/monitored:DM II with stage 2 CKD, Hypertensive Nephropathy, Pure Hypercholesterolemia  Care Plan : RN Care Manager Plan of Care  Updates made by Lynne Logan, RN since 04/10/2021 12:00 AM     Problem: No Plan of Care Established for management of chronic disease states (DM II with stage 2 CKD, Hypertensive Nephropathy, Pure Hypercholesterolemia)   Priority: High     Long-Range Goal: Establishment of Plan of Care for Management of Chronic disease states (DM II with stage 2 CKD, Hypertensive Nephropathy, Pure Hypercholesterolemia)   Start Date: 04/10/2021  Expected End Date: 04/09/2022  This Visit's Progress: On track  Priority: High  Note:   Current Barriers:  Knowledge Deficits related to plan of care for management of DM II with stage 2 CKD, Hypertensive Nephropathy, Pure Hypercholesterolemia   Chronic Disease Management support and education needs related to DM II with stage 2 CKD, Hypertensive Nephropathy, Pure Hypercholesterolemia    RNCM Clinical Goal(s):  Patient will verbalize basic understanding of  DM II with stage 2 CKD, Hypertensive Nephropathy, Pure Hypercholesterolemia  disease process and self health management plan as evidenced by patient will report having no disease exacerbations related to her chronic disease states  take all medications exactly as prescribed and will call provider for medication related questions as evidenced by patient will report having no missed doses of her prescribed medications demonstrate Improved health management independence as evidenced by patient will report 100% adherence to her prescribed treatment plan  continue to work with RN Care Manager to address care management and care coordination needs related to  DM II with stage 2 CKD, Hypertensive Nephropathy, Pure Hypercholesterolemia  as evidenced by adherence to CM Team  Scheduled appointments demonstrate ongoing self health care management ability   as evidenced by    through collaboration with RN Care manager, provider, and care team.   Interventions: 1:1 collaboration with primary care provider regarding development and update of comprehensive plan of care as evidenced by provider attestation and co-signature Inter-disciplinary care team collaboration (see longitudinal plan of care) Evaluation of current treatment plan related to  self management and patient's adherence to plan as established by provider   Diabetes Interventions:  (Status:  Goal on track:  Yes.) Long Term Goal Assessed patient's understanding of A1c goal: <6.5% Provided education to patient about basic DM disease process Reviewed medications with patient and discussed importance of medication adherence Counseled on importance of regular laboratory monitoring as prescribed Provided patient with written educational materials related to hypo and hyperglycemia and importance of correct treatment Review of patient status, including review of consultants reports, relevant laboratory and other test results, and medications completed Assessed social determinant of health barriers Discussed plans with patient for ongoing care management follow up and provided patient with direct contact information for care management team Lab Results  Component Value  Date   HGBA1C 6.8 (H) 02/16/2021   Atypical Parkinsonism Interventions:  (Status:  New goal.)  Long Term Goal Evaluation of current treatment plan related to  Atypical Parkinsonism , self-management and patient's adherence to plan as established by provider Discussed new diagnosis of Atypical Parkinsonism Reviewed and discussed recommendations for treatment per Neurologist, Dr. Rexene Alberts with the following Assessment/Plan noted: Assessment and Plan:  In summary, Karrina Vilikia Watkins Larson is a very pleasant 71 year old female with an underlying  medical history of hypertension, diabetes, reflux disease, allergies, sleep apnea and borderline obesity, who presents for follow-up consultation of her dizziness, fine motor dyscontrol, feeling off balance, changes in handwriting, changes in speech and balance.  History and examination are in keeping with parkinsonism.  She had a DaTscan on 12/19/2020 which showed decreased radiotracer activity within the striata with greater deficit on the left.  She does have a Jaiona Simien bit of lateralization of her symptoms and clinical findings to the right.  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  She had a brain MRI which showed relatively benign findings.  We started Sinemet in September 2022.  She is currently on 1 pill 3 times daily and tolerates it.  She has not noticed any telltale results but her sister seems to think that she has had some improvement in her motor function.  She has benefited from therapy as well.  She is advised to increase her Sinemet to 1 pill 4 times daily at 7:30 AM, 11:30 AM, 3:30 PM, and 7:30 PM daily.  She is encouraged to continue with her exercise regimen and handouts.  She was given.  She is agreeable to increasing the Sinemet.  We talked about the importance of supportive treatments.  We will continue to monitor her memory and may consider memory medication in the near future. She is advised to follow-up in this clinic in about 6 months, sooner if needed.  I answered all their questions today and the patient and her sister were in agreement. Reviewed medications with patient and discussed importance of medication adherence Educated patient on basic disease process related to Parkinsonism Educated on fall risk and fall prevention, instructed patient to report any/all falls promptly and or to seek medical attention if needed Educated patient on aspiration risk and prevention, educated on signs and  symptoms of aspiration and urged patient to seek medical attention if symptoms occur Educated patient about the [employer]; provided patient with the website and encouraged patient to explore this site when possible for resources and to learn about clinical trials and research being done for treatment/cure of PD Discussed plans with patient for ongoing care management follow up and provided patient with direct contact information for care management team  Hyperlipidemia Interventions:  (Status:  Goal on track:  Yes.) Long Term Goal Medication review performed; medication list updated in electronic medical record.  Provider established cholesterol goals reviewed Counseled on importance of regular laboratory monitoring as prescribed Reviewed importance of limiting foods high in cholesterol Reviewed exercise goals and target of 150 minutes per week Discussed plans with patient for ongoing care management follow up and provided patient with direct contact information for care management team  Hypertension Interventions:  (Status:  Goal on track:  Yes.) Long Term Goal Last practice recorded BP readings:  BP Readings from Last 3 Encounters:  04/09/21 (!) 145/79  02/16/21 136/78  01/20/21 124/68  Most recent eGFR/CrCl:  Lab Results  Component  Value Date   EGFR 64 02/16/2021    No components found for: CRCL  Evaluation of current treatment plan related to hypertension self management and patient's adherence to plan as established by provider Counseled on the importance of exercise goals with target of 150 minutes per week Advised patient, providing education and rationale, to monitor blood pressure daily and record, calling PCP for findings outside established parameters Provided education on prescribed diet low Sodium Discussed complications of poorly controlled blood pressure such as heart disease, stroke, circulatory complications, vision complications, kidney impairment, sexual  dysfunction Discussed plans with patient for ongoing care management follow up and provided patient with direct contact information for care management team  Patient Goals/Self-Care Activities: Take all medications as prescribed Attend all scheduled provider appointments Call pharmacy for medication refills 3-7 days in advance of running out of medications Perform all self care activities independently  Perform IADL's (shopping, preparing meals, housekeeping, managing finances) independently Call provider office for new concerns or questions  drink 6 to 8 glasses of water each day manage portion size check blood pressure 3 times per week keep a blood pressure log take blood pressure log to all doctor appointments call doctor for signs and symptoms of high blood pressure take medications for blood pressure exactly as prescribed report new symptoms to your doctor  Follow Up Plan:  Telephone follow up appointment with care management team member scheduled for:  07/03/21      Plan:Telephone follow up appointment with care management team member scheduled for:  07/03/21  Barb Merino, RN, BSN, CCM Care Management Coordinator [facility]  Direct Phone: [phone]

## 2021-04-10 NOTE — Patient Instructions (Signed)
Visit Information  Thank you for taking time to visit with me today. Please don't hesitate to contact me if I can be of assistance to you before our next scheduled telephone appointment.  Following are the goals we discussed today:  (Copy and paste patient goals from clinical care plan here)  Our next appointment is by telephone on 07/03/21 at 10:30 AM  Please call the care guide team at [phone] if you need to cancel or reschedule your appointment.   If you are experiencing a Mental Health or [place] or need someone to talk to, please call [phone]-TALK (toll free, 24 hour hotline)   Patient verbalizes understanding of instructions provided today and agrees to view in [place].   Barb Merino, RN, BSN, CCM Care Management Coordinator [facility]  Direct Phone: [phone]

## 2021-04-11 ENCOUNTER — Other Ambulatory Visit: Payer: Self-pay | Admitting: Internal Medicine

## 2021-04-11 DIAGNOSIS — R490 Dysphonia: Secondary | ICD-10-CM

## 2021-04-13 ENCOUNTER — Telehealth: Payer: Self-pay

## 2021-04-13 ENCOUNTER — Other Ambulatory Visit: Payer: Self-pay

## 2021-04-13 MED ORDER — CARBIDOPA-LEVODOPA 25-100 MG PO TABS: ORAL_TABLET | ORAL | 2 refills | Status: DC

## 2021-04-13 NOTE — Chronic Care Management (AMB) (Addendum)
Chronic Care Management Pharmacy Assistant   Name: April Larson  MRN: [id number] DOB: 1950/08/05   Reason for Encounter: Disease State/ Diabetes  Recent office visits:  04-10-2021 Lynne Logan, RN (CCM)  Recent consult visits:  04-09-2021 [name], MD (Neurology). INCREASE sinemet follow titration instructions given. Follow up in 6 months.  Hospital visits:  None in previous 6 months  Medications: Outpatient Encounter Medications as of 04/13/2021  Medication Sig Note   ACCU-CHEK FASTCLIX LANCETS MISC USE TO CHECK BLOOD SUGARS TWICE DAILY    ACCU-CHEK GUIDE test strip USE TO CHECK BLOOD SUGARS TWICE DAILY AS DIRECTED    aspirin EC 81 MG tablet Take 1 tablet (81 mg total) by mouth daily.    B-D ULTRAFINE III SHORT PEN 31G X 8 MM MISC USE AS DIRECTED TWICE DAILY    Biotin 10 MG CAPS Take 1 tablet by mouth daily.    Blood Glucose Monitoring Suppl (ACCU-CHEK GUIDE ME) w/Device KIT USE TO CHECK BLOOD GLUCOSE TWICE DAILY    calcium carbonate (OSCAL) 1500 (600 Ca) MG TABS tablet Take 1 tablet (1,500 mg total) by mouth daily with breakfast.    carbidopa-levodopa (SINEMET IR) 25-100 MG tablet TAKE ONE TABLET BY MOUTH THREE TIMES DAILY. FOLLOW titration instructions provided separately in writing    Cholecalciferol 125 MCG (5000 UT) TABS Take 1 tablet (5,000 Units total) by mouth daily.    diclofenac Sodium (VOLTAREN) 1 % GEL APPLY 2 GRAMS TOPICALLY TO THE AFFECTED AREA FOUR TIMES DAILY    fluticasone (FLONASE) 50 MCG/ACT nasal spray shake liquid AND USE TWO SPRAYS IN EACH NOSTRIL DAILY    LIVALO 4 MG TABS TAKE 1 TABLET BY MOUTH DAILY    loratadine (CLARITIN) 10 MG tablet Take 1 tablet by mouth daily    metFORMIN (GLUCOPHAGE) 500 MG tablet Take 1 tablet (500 mg total) by mouth daily with breakfast. Dinner    Multiple Vitamins-Minerals (ONE-A-DAY WOMENS 50+ ADVANTAGE) TABS Take 1 tablet by mouth daily    NON FORMULARY 2 each. Calm 08/13/2020: Taking 2 at night before  bed.    Omega-3 Fatty Acids (FISH OIL) 1000 MG CAPS Take 1 capsule by mouth daily    omeprazole (PRILOSEC) 40 MG capsule TAKE 1 CAPSULE BY MOUTH EVERY DAY BEFORE A MEAL    pioglitazone (ACTOS) 15 MG tablet Take 1 tablet (15 mg total) by mouth daily.    telmisartan-hydrochlorothiazide (MICARDIS HCT) 40-12.5 MG tablet Take 1 tablet by mouth daily.    Turmeric 500 MG CAPS Take 1 capsule by mouth 2 (two) times daily.    vitamin C (ASCORBIC ACID) 500 MG tablet Take 1 tablet (500 mg total) by mouth daily.    XULTOPHY 100-3.6 UNIT-MG/ML SOPN INJECT 25 UNITS SUBCUTANEOUSLY DAILY    No facility-administered encounter medications on file as of 04/13/2021.  Recent Relevant Labs: Lab Results  Component Value Date/Time   HGBA1C 6.8 (H) 02/16/2021 05:06 PM   HGBA1C 7.0 (H) 11/10/2020 12:16 PM   MICROALBUR 10 11/10/2020 01:13 PM   MICROALBUR 10 10/23/2019 03:42 PM    Kidney Function Lab Results  Component Value Date/Time   CREATININE 0.96 02/16/2021 05:06 PM   CREATININE CANCELED 11/10/2020 12:16 PM   GFRNONAA 65 04/15/2020 04:07 PM   GFRNONAA 59 (L) 03/27/2020 02:24 AM   GFRAA 75 04/15/2020 04:07 PM    Current antihyperglycemic regimen:  Actos 15 mg MWF Metformin 500 mg daily Xultophy 25 units daily  What recent interventions/DTPs have been made to improve  glycemic control:  Educated on Complications of diabetes including kidney damage, retinal damage, and cardiovascular disease; Benefits of routine self-monitoring of blood sugar; Carbohydrate counting and/or plate method -Counseled to check feet daily and get yearly eye exams -Recommend patients Actos be discontinued, collaborated with PCP who agrees, and advised to use a step down method.  -Patient is concerned about the cost of the medication due to it being out network next year and costing 35 dollars per month.              -Currently applying for patient assistance for Xultophy for next year recommended patient receive refill of  medication for 90 day supply  -Patient to take one tablet Monday, Wednesday and Friday until she runs out of medications  -Recommended to continue current medication -Health concierge to follow up on [place] application for [id number]  Have there been any recent hospitalizations or ED visits since last visit with CPP? No  Patient denies hypoglycemic symptoms  Patient denies hyperglycemic symptoms  How often are you checking your blood sugar? once daily  What are your blood sugars ranging?  Fasting: 01-07 112, 01-08 127, 01-09 62 Before meals: None After meals: 01-09 130 Bedtime: None  During the week, how often does your blood glucose drop below 70? Never. Patient states blood sugar was 62 this morning with no symptoms. Instructed patient to always recheck blood sugar after eating if reading is below 70. Patient states she will call back in a hour with second reading. Patient called and reported reading of 130 after breakfast.  Are you checking your feet daily/regularly? Patient states daily  Adherence Review: Is the patient currently on a STATIN medication? Yes Is the patient currently on ACE/ARB medication? Yes Does the patient have >5 day gap between last estimated fill dates? No  NOTES: Received a message from [place] [facility] [facility] stating patient only has 198 supply on file for carbidopa-levodopa and will need a 270 supply since she takes 3 times daily. Patient stated she had to titrate up to 4 pills daily. Sent a request for a new script for 4 times daily and a 90 day supply with refills to [facility].  Care Gaps: Covid booster overdue  AWV 07-01-2021  [facility] Drugs: Metformin 500 mg- Last filled 01-16-2021 90 DS [facility] Xultophy 100-3.6 unit- Last filled 03-03-2021 82 DS [facility] Telmisartan-HCTZ 40-12.5 mg-  Last filled 04-01-2021 90 DS [facility] Clinical Pharmacist Assistant [phone]

## 2021-04-13 NOTE — Telephone Encounter (Signed)
The pt was notified that the prescription for than cane that she requested is ready for pickup.

## 2021-04-14 DIAGNOSIS — H53483 Generalized contraction of visual field, bilateral: Secondary | ICD-10-CM | POA: Diagnosis not present

## 2021-04-14 DIAGNOSIS — H5000 Unspecified esotropia: Secondary | ICD-10-CM | POA: Diagnosis not present

## 2021-04-17 ENCOUNTER — Telehealth: Payer: Self-pay

## 2021-04-17 NOTE — Chronic Care Management (AMB) (Signed)
° ° °  Chronic Care Management Pharmacy Assistant   Name: April Larson  MRN: [id number] DOB: November 20, 1950  Reason for Encounter: Patient Assistance Coordination  04/17/2021- Called patient to inform that Xultophy supply from [employer] patient assistance program arrived to the office. No answer, left message to stop by PCP office to pick up medication.    Medications: Outpatient Encounter Medications as of 04/17/2021  Medication Sig Note   ACCU-CHEK FASTCLIX LANCETS MISC USE TO CHECK BLOOD SUGARS TWICE DAILY    ACCU-CHEK GUIDE test strip USE TO CHECK BLOOD SUGARS TWICE DAILY AS DIRECTED    aspirin EC 81 MG tablet Take 1 tablet (81 mg total) by mouth daily.    B-D ULTRAFINE III SHORT PEN 31G X 8 MM MISC USE AS DIRECTED TWICE DAILY    Biotin 10 MG CAPS Take 1 tablet by mouth daily.    Blood Glucose Monitoring Suppl (ACCU-CHEK GUIDE ME) w/Device KIT USE TO CHECK BLOOD GLUCOSE TWICE DAILY    calcium carbonate (OSCAL) 1500 (600 Ca) MG TABS tablet Take 1 tablet (1,500 mg total) by mouth daily with breakfast.    carbidopa-levodopa (SINEMET IR) 25-100 MG tablet TAKE ONE TABLET BY MOUTH four TIMES DAILY. FOLLOW titration instructions provided separately in writing    Cholecalciferol 125 MCG (5000 UT) TABS Take 1 tablet (5,000 Units total) by mouth daily.    diclofenac Sodium (VOLTAREN) 1 % GEL APPLY 2 GRAMS TOPICALLY TO THE AFFECTED AREA FOUR TIMES DAILY    fluticasone (FLONASE) 50 MCG/ACT nasal spray shake liquid AND USE TWO SPRAYS IN EACH NOSTRIL DAILY    LIVALO 4 MG TABS TAKE 1 TABLET BY MOUTH DAILY    loratadine (CLARITIN) 10 MG tablet Take 1 tablet by mouth daily    metFORMIN (GLUCOPHAGE) 500 MG tablet Take 1 tablet (500 mg total) by mouth daily with breakfast. Dinner    Multiple Vitamins-Minerals (ONE-A-DAY WOMENS 50+ ADVANTAGE) TABS Take 1 tablet by mouth daily    NON FORMULARY 2 each. Calm 08/13/2020: Taking 2 at night before bed.    Omega-3 Fatty Acids (FISH OIL) 1000 MG CAPS  Take 1 capsule by mouth daily    omeprazole (PRILOSEC) 40 MG capsule TAKE 1 CAPSULE BY MOUTH EVERY DAY BEFORE A MEAL    pioglitazone (ACTOS) 15 MG tablet Take 1 tablet (15 mg total) by mouth daily.    telmisartan-hydrochlorothiazide (MICARDIS HCT) 40-12.5 MG tablet Take 1 tablet by mouth daily.    Turmeric 500 MG CAPS Take 1 capsule by mouth 2 (two) times daily.    vitamin C (ASCORBIC ACID) 500 MG tablet Take 1 tablet (500 mg total) by mouth daily.    XULTOPHY 100-3.6 UNIT-MG/ML SOPN INJECT 25 UNITS SUBCUTANEOUSLY DAILY    No facility-administered encounter medications on file as of 04/17/2021.   [name], Man Pharmacist Assistant [phone]

## 2021-04-22 ENCOUNTER — Other Ambulatory Visit: Payer: Self-pay

## 2021-04-22 MED ORDER — OMEPRAZOLE 40 MG PO CPDR: DELAYED_RELEASE_CAPSULE | ORAL | 2 refills | Status: DC

## 2021-04-29 DIAGNOSIS — G4733 Obstructive sleep apnea (adult) (pediatric): Secondary | ICD-10-CM | POA: Diagnosis not present

## 2021-05-05 DIAGNOSIS — I129 Hypertensive chronic kidney disease with stage 1 through stage 4 chronic kidney disease, or unspecified chronic kidney disease: Secondary | ICD-10-CM

## 2021-05-05 DIAGNOSIS — N182 Chronic kidney disease, stage 2 (mild): Secondary | ICD-10-CM | POA: Diagnosis not present

## 2021-05-05 DIAGNOSIS — E1122 Type 2 diabetes mellitus with diabetic chronic kidney disease: Secondary | ICD-10-CM

## 2021-05-05 DIAGNOSIS — E7800 Pure hypercholesterolemia, unspecified: Secondary | ICD-10-CM | POA: Diagnosis not present

## 2021-05-12 ENCOUNTER — Ambulatory Visit: Payer: Medicare Other | Admitting: Neurology

## 2021-05-21 ENCOUNTER — Telehealth: Payer: Self-pay | Admitting: Neurology

## 2021-05-21 DIAGNOSIS — M25561 Pain in right knee: Secondary | ICD-10-CM | POA: Diagnosis not present

## 2021-05-21 DIAGNOSIS — M1711 Unilateral primary osteoarthritis, right knee: Secondary | ICD-10-CM | POA: Diagnosis not present

## 2021-05-21 NOTE — Telephone Encounter (Signed)
Pt would like a call from the nurse in regards to a referral to [facility], Dr. Sanda Klein. Dr. Katy Fitch send notes to [place]. Dr Katy Fitch recommended get a referral from neurologist to [facility] due for peripheral vision getting worse.

## 2021-05-21 NOTE — Telephone Encounter (Signed)
I reviewed Dr. Zenia Resides note from 04/14/2021.  He did not recommend any prism glasses. He suggested to consider an opinion with a neuro-ophthalmologist like Dr. Jolyn Nap at [facility]. I do agree with her follow-up as scheduled with Dr. Katy Fitch. From my end of things, I really do not have any reason to request evaluation with a neuro-ophthalmologist.  Please relay back to the patient that I did receive Dr. Zenia Resides note and reviewed it.  He did a thorough evaluation and has scheduled her for follow-up routinely as well.

## 2021-05-21 NOTE — Telephone Encounter (Signed)
I don't recall seeing a note from Dr. Katy Fitch. I can see a referral to ophth per PCP from last year.  I would recommend the patient ask Dr. Katy Fitch for recommendations and a referral to the recommended eye specialist, as he would have more information on what to look for or ask for. She can also talk to PCP.

## 2021-05-21 NOTE — Telephone Encounter (Signed)
I called spoke to pt/sister, [name].  They had seen Dr. Katy Fitch for years.  Was seen in his office for vision changes after hitting her head in 03/2021.  Had gone to ED 03-2021, no concussion.  I relayed that we saw pt in 04/2021 and no mention of this to [place].  Dr. Katy Fitch said in note (she will fax to [place] again), to consider second opinion re: concussion/vision changes per Neurology recommendations.  Received fax , to Dr. Rexene Alberts in box.

## 2021-05-22 ENCOUNTER — Telehealth: Payer: Self-pay

## 2021-05-22 NOTE — Chronic Care Management (AMB) (Signed)
Chronic Care Management Pharmacy Assistant   Name: April Larson  MRN: [id number] DOB: November 08, 1950  Reason for Encounter: Disease State/ Diabetes  Recent office visits:  None  Recent consult visits:  None  Hospital visits:  None in previous 6 months  Medications: Outpatient Encounter Medications as of 05/22/2021  Medication Sig Note   ACCU-CHEK FASTCLIX LANCETS MISC USE TO CHECK BLOOD SUGARS TWICE DAILY    ACCU-CHEK GUIDE test strip USE TO CHECK BLOOD SUGARS TWICE DAILY AS DIRECTED    aspirin EC 81 MG tablet Take 1 tablet (81 mg total) by mouth daily.    B-D ULTRAFINE III SHORT PEN 31G X 8 MM MISC USE AS DIRECTED TWICE DAILY    Biotin 10 MG CAPS Take 1 tablet by mouth daily.    Blood Glucose Monitoring Suppl (ACCU-CHEK GUIDE ME) w/Device KIT USE TO CHECK BLOOD GLUCOSE TWICE DAILY    calcium carbonate (OSCAL) 1500 (600 Ca) MG TABS tablet Take 1 tablet (1,500 mg total) by mouth daily with breakfast.    carbidopa-levodopa (SINEMET IR) 25-100 MG tablet TAKE ONE TABLET BY MOUTH four TIMES DAILY. FOLLOW titration instructions provided separately in writing    Cholecalciferol 125 MCG (5000 UT) TABS Take 1 tablet (5,000 Units total) by mouth daily.    diclofenac Sodium (VOLTAREN) 1 % GEL APPLY 2 GRAMS TOPICALLY TO THE AFFECTED AREA FOUR TIMES DAILY    fluticasone (FLONASE) 50 MCG/ACT nasal spray shake liquid AND USE TWO SPRAYS IN EACH NOSTRIL DAILY    LIVALO 4 MG TABS TAKE 1 TABLET BY MOUTH DAILY    loratadine (CLARITIN) 10 MG tablet Take 1 tablet by mouth daily    metFORMIN (GLUCOPHAGE) 500 MG tablet Take 1 tablet (500 mg total) by mouth daily with breakfast. Dinner    Multiple Vitamins-Minerals (ONE-A-DAY WOMENS 50+ ADVANTAGE) TABS Take 1 tablet by mouth daily    NON FORMULARY 2 each. Calm 08/13/2020: Taking 2 at night before bed.    Omega-3 Fatty Acids (FISH OIL) 1000 MG CAPS Take 1 capsule by mouth daily    omeprazole (PRILOSEC) 40 MG capsule TAKE 1 CAPSULE BY MOUTH  EVERY DAY BEFORE A MEAL    pioglitazone (ACTOS) 15 MG tablet Take 1 tablet (15 mg total) by mouth daily.    telmisartan-hydrochlorothiazide (MICARDIS HCT) 40-12.5 MG tablet Take 1 tablet by mouth daily.    Turmeric 500 MG CAPS Take 1 capsule by mouth 2 (two) times daily.    vitamin C (ASCORBIC ACID) 500 MG tablet Take 1 tablet (500 mg total) by mouth daily.    XULTOPHY 100-3.6 UNIT-MG/ML SOPN INJECT 25 UNITS SUBCUTANEOUSLY DAILY    No facility-administered encounter medications on file as of 05/22/2021.  Recent Relevant Labs: Lab Results  Component Value Date/Time   HGBA1C 6.8 (H) 02/16/2021 05:06 PM   HGBA1C 7.0 (H) 11/10/2020 12:16 PM   MICROALBUR 10 11/10/2020 01:13 PM   MICROALBUR 10 10/23/2019 03:42 PM    Kidney Function Lab Results  Component Value Date/Time   CREATININE 0.96 02/16/2021 05:06 PM   CREATININE CANCELED 11/10/2020 12:16 PM   GFRNONAA 65 04/15/2020 04:07 PM   GFRNONAA 59 (L) 03/27/2020 02:24 AM   GFRAA 75 04/15/2020 04:07 PM    05-22-2021: 1st attempt left VM 05-26-2021: 2nd attempt left VM 05-27-2021: 3rd attempt left VM  Care Gaps: Covid booster overdue  AWV 07-01-2021  Star Rating Drugs: Metformin 500 mg- Last filled 04-11-2021 90 DS [facility] Xultophy 100-3.6 unit- Last filled 03-03-2021 82 DS [facility]  Telmisartan-HCTZ 40-12.5 mg-  Last filled 04-01-2021 90 DS [facility] Livalo 4 mg- Last filled 04-10-2021 90 DS [facility] Clinical Pharmacist Assistant [phone]

## 2021-05-25 NOTE — Telephone Encounter (Signed)
Spoke directly with the patient and discussed the message from Dr Rexene Alberts.  The patient verbalized understanding and agreement.  She did note that her vision had worsened since she saw Dr. Katy Fitch on 04/14/2021.  She states before, she could look down and pick up an item.  Now when she looks down she "cannot see".  States this involves both eyes. She states she did not test the eyes separately. I asked her if it was blurry or black when she looks down and she said it was blurry.  She has an appointment with Dr. Katy Fitch tomorrow.  I told her she could call and see if she could be seen earlier.  Also advised her I would let Dr. Rexene Alberts know.  She verbalized appreciation for the call.

## 2021-05-26 DIAGNOSIS — H53483 Generalized contraction of visual field, bilateral: Secondary | ICD-10-CM | POA: Diagnosis not present

## 2021-05-26 DIAGNOSIS — H5000 Unspecified esotropia: Secondary | ICD-10-CM | POA: Diagnosis not present

## 2021-05-26 LAB — HM DIABETES EYE EXAM

## 2021-05-27 ENCOUNTER — Encounter: Payer: Self-pay | Admitting: Internal Medicine

## 2021-06-17 ENCOUNTER — Telehealth: Payer: Self-pay

## 2021-06-17 NOTE — Chronic Care Management (AMB) (Signed)
? ? ?Chronic Care Management ?Pharmacy Assistant  ? ?Name: Zylah Vilikia Watkins [place]  MRN: [id number] DOB: 1950/08/28 ? ?Reason for Encounter: Disease State/ Diabetes ? ?Recent office visits:  ?None ? ?Recent consult visits:  ?None ? ?Hospital visits:  ?None in previous 6 months ? ?Medications: ?Outpatient Encounter Medications as of 06/17/2021  ?Medication Sig Note  ? ACCU-CHEK FASTCLIX LANCETS MISC USE TO CHECK BLOOD SUGARS TWICE DAILY   ? ACCU-CHEK GUIDE test strip USE TO CHECK BLOOD SUGARS TWICE DAILY AS DIRECTED   ? aspirin EC 81 MG tablet Take 1 tablet (81 mg total) by mouth daily.   ? B-D ULTRAFINE III SHORT PEN 31G X 8 MM MISC USE AS DIRECTED TWICE DAILY   ? Biotin 10 MG CAPS Take 1 tablet by mouth daily.   ? Blood Glucose Monitoring Suppl (ACCU-CHEK GUIDE ME) w/Device KIT USE TO CHECK BLOOD GLUCOSE TWICE DAILY   ? calcium carbonate (OSCAL) 1500 (600 Ca) MG TABS tablet Take 1 tablet (1,500 mg total) by mouth daily with breakfast.   ? carbidopa-levodopa (SINEMET IR) 25-100 MG tablet TAKE ONE TABLET BY MOUTH four TIMES DAILY. FOLLOW titration instructions provided separately in writing   ? Cholecalciferol 125 MCG (5000 UT) TABS Take 1 tablet (5,000 Units total) by mouth daily.   ? diclofenac Sodium (VOLTAREN) 1 % GEL APPLY 2 GRAMS TOPICALLY TO THE AFFECTED AREA FOUR TIMES DAILY   ? fluticasone (FLONASE) 50 MCG/ACT nasal spray shake liquid AND USE TWO SPRAYS IN EACH NOSTRIL DAILY   ? LIVALO 4 MG TABS TAKE 1 TABLET BY MOUTH DAILY   ? loratadine (CLARITIN) 10 MG tablet Take 1 tablet by mouth daily   ? metFORMIN (GLUCOPHAGE) 500 MG tablet Take 1 tablet (500 mg total) by mouth daily with breakfast. Dinner   ? Multiple Vitamins-Minerals (ONE-A-DAY WOMENS 50+ ADVANTAGE) TABS Take 1 tablet by mouth daily   ? NON FORMULARY 2 each. Calm 08/13/2020: Taking 2 at night before bed.   ? Omega-3 Fatty Acids (FISH OIL) 1000 MG CAPS Take 1 capsule by mouth daily   ? omeprazole (PRILOSEC) 40 MG capsule TAKE 1 CAPSULE BY MOUTH  EVERY DAY BEFORE A MEAL   ? pioglitazone (ACTOS) 15 MG tablet Take 1 tablet (15 mg total) by mouth daily.   ? telmisartan-hydrochlorothiazide (MICARDIS HCT) 40-12.5 MG tablet Take 1 tablet by mouth daily.   ? Turmeric 500 MG CAPS Take 1 capsule by mouth 2 (two) times daily.   ? vitamin C (ASCORBIC ACID) 500 MG tablet Take 1 tablet (500 mg total) by mouth daily.   ? XULTOPHY 100-3.6 UNIT-MG/ML SOPN INJECT 25 UNITS SUBCUTANEOUSLY DAILY   ? ?No facility-administered encounter medications on file as of 06/17/2021.  ?Recent Relevant Labs: ?Lab Results  ?Component Value Date/Time  ? HGBA1C 6.8 (H) 02/16/2021 05:06 PM  ? HGBA1C 7.0 (H) 11/10/2020 12:16 PM  ? MICROALBUR 10 11/10/2020 01:13 PM  ? MICROALBUR 10 10/23/2019 03:42 PM  ?  ?Kidney Function ?Lab Results  ?Component Value Date/Time  ? CREATININE 0.96 02/16/2021 05:06 PM  ? CREATININE CANCELED 11/10/2020 12:16 PM  ? GFRNONAA 65 04/15/2020 04:07 PM  ? GFRNONAA 59 (L) 03/27/2020 02:24 AM  ? GFRAA 75 04/15/2020 04:07 PM  ? ? ?06-17-2021: 1st attempt left VM ?06-18-2021: 2nd attempt Left VM ?06-22-2021: 3rd attempt left VM ? ?Care Gaps: ?Covid booster overdue  ?AWV 07-01-2021 ? ?Star Rating Drugs: ?Metformin 500 mg- Last filled 04-11-2021 90 [facility] ?Xultophy 100-3.6 unit- Patient assistance ?Telmisartan-HCTZ 40-12.5 mg-  Last filled 04-01-2021 90 DS [facility] ? ?Malecca Hicks CMA ?Clinical Pharmacist Assistant ?[phone] ? ?

## 2021-07-01 ENCOUNTER — Encounter: Payer: Medicare Other | Admitting: Internal Medicine

## 2021-07-01 ENCOUNTER — Ambulatory Visit: Payer: Medicare Other

## 2021-07-01 ENCOUNTER — Telehealth: Payer: Self-pay

## 2021-07-01 NOTE — Chronic Care Management (AMB) (Signed)
? ? ?Chronic Care Management ?Pharmacy Assistant  ? ?Name: April Larson  MRN: [id number] DOB: [birth year] ? ?Reason for Encounter: Medication Review/ Medication Coordination ? ?Recent office visits:  ?04-10-2021 Little, Claudette Stapler, RN (CCM) ? ?Recent consult visits:  ?05-21-2021 Plocki, Albin Felling ([facility]). XR Knee 3 view completed. ? ?04-09-2021 [name], MD (Neurology). Follow visit for parkinson's. ? ?04-08-2021 Antionette Poles, MD (Foot/Ankle). No changes. ? ?Hospital visits:  ?None in previous 6 months ? ?Medications: ?Outpatient Encounter Medications as of 07/01/2021  ?Medication Sig Note  ? ACCU-CHEK FASTCLIX LANCETS MISC USE TO CHECK BLOOD SUGARS TWICE DAILY   ? ACCU-CHEK GUIDE test strip USE TO CHECK BLOOD SUGARS TWICE DAILY AS DIRECTED   ? aspirin EC 81 MG tablet Take 1 tablet (81 mg total) by mouth daily.   ? B-D ULTRAFINE III SHORT PEN 31G X 8 MM MISC USE AS DIRECTED TWICE DAILY   ? Biotin 10 MG CAPS Take 1 tablet by mouth daily.   ? Blood Glucose Monitoring Suppl (ACCU-CHEK GUIDE ME) w/Device KIT USE TO CHECK BLOOD GLUCOSE TWICE DAILY   ? calcium carbonate (OSCAL) 1500 (600 Ca) MG TABS tablet Take 1 tablet (1,500 mg total) by mouth daily with breakfast.   ? carbidopa-levodopa (SINEMET IR) 25-100 MG tablet TAKE ONE TABLET BY MOUTH four TIMES DAILY. FOLLOW titration instructions provided separately in writing   ? Cholecalciferol 125 MCG (5000 UT) TABS Take 1 tablet (5,000 Units total) by mouth daily.   ? diclofenac Sodium (VOLTAREN) 1 % GEL APPLY 2 GRAMS TOPICALLY TO THE AFFECTED AREA FOUR TIMES DAILY   ? fluticasone (FLONASE) 50 MCG/ACT nasal spray shake liquid AND USE TWO SPRAYS IN EACH NOSTRIL DAILY   ? LIVALO 4 MG TABS TAKE 1 TABLET BY MOUTH DAILY   ? loratadine (CLARITIN) 10 MG tablet Take 1 tablet by mouth daily   ? metFORMIN (GLUCOPHAGE) 500 MG tablet Take 1 tablet (500 mg total) by mouth daily with breakfast. Dinner   ? Multiple Vitamins-Minerals (ONE-A-DAY WOMENS 50+  ADVANTAGE) TABS Take 1 tablet by mouth daily   ? NON FORMULARY 2 each. Calm 08/13/2020: Taking 2 at night before bed.   ? Omega-3 Fatty Acids (FISH OIL) 1000 MG CAPS Take 1 capsule by mouth daily   ? omeprazole (PRILOSEC) 40 MG capsule TAKE 1 CAPSULE BY MOUTH EVERY DAY BEFORE A MEAL   ? pioglitazone (ACTOS) 15 MG tablet Take 1 tablet (15 mg total) by mouth daily.   ? telmisartan-hydrochlorothiazide (MICARDIS HCT) 40-12.5 MG tablet Take 1 tablet by mouth daily.   ? Turmeric 500 MG CAPS Take 1 capsule by mouth 2 (two) times daily.   ? vitamin C (ASCORBIC ACID) 500 MG tablet Take 1 tablet (500 mg total) by mouth daily.   ? XULTOPHY 100-3.6 UNIT-MG/ML SOPN INJECT 25 UNITS SUBCUTANEOUSLY DAILY   ? ?No facility-administered encounter medications on file as of 07/01/2021.  ?Reviewed chart for medication changes ahead of medication coordination call. ? ?No hospital visits since last care coordination call/Pharmacist visit.  ? ?No medication changes indicated OR if recent visit, treatment plan here. ? ?BP Readings from Last 3 Encounters:  ?04/09/21 (!) 145/79  ?02/16/21 136/78  ?01/20/21 124/68  ?  ?Lab Results  ?Component Value Date  ? HGBA1C 6.8 (H) 02/16/2021  ?  ? ?Patient obtains medications through Adherence Packaging  90 Days  ? ?Last adherence delivery included:  ?Fluticasone NS- 2 sprays each nostril daily  ?Omeprazole 40 mg- 1 tablet daily( breakfast) ?Pioglitazone  15 mg- 1 tablet daily (Bedtime)  ?Livalo 4 mg- 1 tablet daily (Bedtime) ?Metformin 500 mg- 1 tablet daily (Bedtime) ?One-a Day Womens Multivitamin- 1 tablet daily (Breakfast) ?Vitamin D3 5, 000 units- 1 tablet daily (Bedtime) ?Asa 81 mg- 1 tablet daily (Breakfast) ?Loratadine 10 mg- 1 tablet daily (Breakfast) ?Vitamin C 1000 mg- 1 tablet daily (Breakfast) ?Carbidopa 25-100 mg 1 before breakfast, at lunch, evening meal and bedtime ? ?Patient declined (meds) last month: ?Temisartan/HCTZ 40-12.5 mg- 1 tablet daily (Breakfast) due to receiving at [facility] on  04/01/2021 for 90 DS. Received 20 DS on 06-25-2021. ? ?Patient is due for next adherence delivery on: 07-13-2021 ? ?Patient needs refills for: refill request sent ?Flonase  ? ?Unable to confirm delivery date of 07-13-2021 ? ?07-01-2021: 1st attempt left VM ?07-03-2021: 2nd attempt left VM ? ?Care Gaps: ?Covid booster overdue ?AWV 07-01-2021 canceled ? ?Star Rating Drugs: ?Telmisartan-HCTZ 40-12.5 mg- Last filled 06-25-2021 [facility] ?Metformin 500 mg- Last filled 04-11-2021 90 [facility] ?Xultophy 100-3.6 unit- Last filled 04-11-2021 90 [facility] ? ?Malecca Hicks CMA ?Clinical Pharmacist Assistant ?[phone] ? ?

## 2021-07-01 NOTE — Patient Instructions (Signed)

## 2021-07-01 NOTE — Progress Notes (Signed)
Pt cancelled appt

## 2021-07-02 ENCOUNTER — Other Ambulatory Visit: Payer: Self-pay | Admitting: Internal Medicine

## 2021-07-03 ENCOUNTER — Telehealth: Payer: Medicare Other

## 2021-07-03 ENCOUNTER — Other Ambulatory Visit: Payer: Self-pay

## 2021-07-03 DIAGNOSIS — R490 Dysphonia: Secondary | ICD-10-CM

## 2021-07-03 MED ORDER — FLUTICASONE PROPIONATE 50 MCG/ACT NA SUSP: NASAL | 2 refills | Status: DC

## 2021-07-04 ENCOUNTER — Other Ambulatory Visit: Payer: Self-pay | Admitting: Internal Medicine

## 2021-07-08 ENCOUNTER — Ambulatory Visit (INDEPENDENT_AMBULATORY_CARE_PROVIDER_SITE_OTHER): Payer: Medicare Other | Admitting: Internal Medicine

## 2021-07-08 ENCOUNTER — Telehealth: Payer: Self-pay

## 2021-07-08 ENCOUNTER — Encounter: Payer: Self-pay | Admitting: Internal Medicine

## 2021-07-08 VITALS — BP 120/68 | HR 90 | Temp 98.3°F | Ht 61.0 in | Wt 168.6 lb

## 2021-07-08 DIAGNOSIS — I129 Hypertensive chronic kidney disease with stage 1 through stage 4 chronic kidney disease, or unspecified chronic kidney disease: Secondary | ICD-10-CM

## 2021-07-08 DIAGNOSIS — E1122 Type 2 diabetes mellitus with diabetic chronic kidney disease: Secondary | ICD-10-CM | POA: Diagnosis not present

## 2021-07-08 DIAGNOSIS — G20 Parkinson's disease: Secondary | ICD-10-CM | POA: Diagnosis not present

## 2021-07-08 DIAGNOSIS — Z6831 Body mass index (BMI) 31.0-31.9, adult: Secondary | ICD-10-CM

## 2021-07-08 DIAGNOSIS — E6609 Other obesity due to excess calories: Secondary | ICD-10-CM

## 2021-07-08 DIAGNOSIS — H53453 Other localized visual field defect, bilateral: Secondary | ICD-10-CM

## 2021-07-08 DIAGNOSIS — N182 Chronic kidney disease, stage 2 (mild): Secondary | ICD-10-CM

## 2021-07-08 MED ORDER — LIVALO 4 MG PO TABS: 1.0000 | ORAL_TABLET | Freq: Every day | ORAL | 1 refills | Status: DC

## 2021-07-08 MED ORDER — METFORMIN HCL 500 MG PO TABS: 500.0000 mg | ORAL_TABLET | Freq: Every day | ORAL | 1 refills | Status: DC

## 2021-07-08 NOTE — Chronic Care Management (AMB) (Signed)
CARE PLAN ENTRY ?(see longitudinal plan of care for additional care plan information) ? ?Current Barriers:  ?Financial Barriers: patient has [employer] and reports copay for [name]  is cost prohibitive at this time ? ?Pharmacist Clinical Goal(s):  ?Over the next 30 days, patient will work with PharmD and providers to relieve medication access concerns ? ?Interventions: ?Comprehensive medication review completed; medication list updated in electronic medical record.  ?Inter-disciplinary care team collaboration (see longitudinal plan of care) ?Called patient to let her know that her medication came in the office, patient reports that she is coming in today for an office visit and will pick it up.  ? ?Patient Self Care Activities:  ?Patient will provide necessary portions of application  ? ?Please see past updates related to this goal by clicking on the "Past Updates" button in the selected goal  ? ?Orlando Penner, CPP, PharmD ?Clinical Pharmacist Practitioner ?[facility] ?[phone] ? ?

## 2021-07-08 NOTE — Progress Notes (Signed)
?I,[name],acting as a scribe for  N , MD.,have documented all relevant documentation on the behalf of  N , MD,as directed by   N , MD while in the presence of  N , MD.  ?This visit occurred during the SARS-CoV-2 public health emergency.  Safety protocols were in place, including screening questions prior to the visit, additional usage of staff PPE, and extensive cleaning of exam room while observing appropriate contact time as indicated for disinfecting solutions. ? ?Subjective:  ?  ? Patient ID: April Larson , female    DOB: 04/20/1950 , 70 y.o.   MRN: [id number] ? ? ?Chief Complaint  ?Patient presents with  ? Diabetes  ? Hypertension  ? ? ?HPI ? ?She is here today for DM/HTN check.  She is accompanied  by her sister today. She reports compliance with meds. Denies having any headaches, chest pain and shortness of breath.  ? ? ?Diabetes ?She presents for her follow-up diabetic visit. She has type 2 diabetes mellitus. Pertinent negatives for hypoglycemia include no headaches. Pertinent negatives for diabetes include no blurred vision, no chest pain, no polydipsia, no polyphagia and no polyuria. Risk factors for coronary artery disease include diabetes mellitus, dyslipidemia, hypertension, post-menopausal and sedentary lifestyle. She participates in exercise intermittently. Her breakfast blood glucose is taken between 8-9 am. Her breakfast blood glucose range is generally 70-90 mg/dl. An ACE inhibitor/angiotensin II receptor blocker is being taken. Eye exam is current.  ?Hypertension ?This is a chronic problem. The current episode started more than 1 year ago. Condition status: FAIR CONTROL. Pertinent negatives include no anxiety, blurred vision, chest pain or headaches. The current treatment provides moderate improvement.   ? ?Past Medical History:  ?Diagnosis Date  ? Diabetes mellitus without complication (HCC)   ? Hypertension   ?  Substance abuse (HCC)   ?  ? ?Family History  ?Problem Relation Age of Onset  ? Cancer Mother   ? Diabetes Father   ? Cancer Brother   ? Breast cancer Maternal Aunt   ? Parkinsonism Neg Hx   ? ? ? ?Current Outpatient Medications:  ?  ACCU-CHEK FASTCLIX LANCETS MISC, USE TO CHECK BLOOD SUGARS TWICE DAILY, Disp: 100 each, Rfl: 11 ?  ACCU-CHEK GUIDE test strip, USE TO CHECK BLOOD SUGARS TWICE DAILY AS DIRECTED, Disp: 100 strip, Rfl: 2 ?  aspirin EC 81 MG tablet, Take 1 tablet (81 mg total) by mouth daily., Disp: 90 tablet, Rfl: 1 ?  B-D ULTRAFINE III SHORT PEN 31G X 8 MM MISC, USE AS DIRECTED TWICE DAILY, Disp: 100 each, Rfl: 11 ?  Biotin 10 MG CAPS, Take 1 tablet by mouth daily., Disp: 90 capsule, Rfl: 1 ?  Blood Glucose Monitoring Suppl (ACCU-CHEK GUIDE ME) w/Device KIT, USE TO CHECK BLOOD GLUCOSE TWICE DAILY, Disp: 3 kit, Rfl: 3 ?  calcium carbonate (OSCAL) 1500 (600 Ca) MG TABS tablet, Take 1 tablet (1,500 mg total) by mouth daily with breakfast., Disp: 90 tablet, Rfl: 1 ?  carbidopa-levodopa (SINEMET IR) 25-100 MG tablet, TAKE ONE TABLET BY MOUTH four TIMES DAILY. FOLLOW titration instructions provided separately in writing, Disp: 360 tablet, Rfl: 2 ?  Cholecalciferol 125 MCG (5000 UT) TABS, Take 1 tablet (5,000 Units total) by mouth daily., Disp: 90 tablet, Rfl: 1 ?  diclofenac Sodium (VOLTAREN) 1 % GEL, APPLY 2 GRAMS TOPICALLY TO THE AFFECTED AREA FOUR TIMES DAILY, Disp: 100 g, Rfl: 2 ?  fluticasone (FLONASE) 50 MCG/ACT nasal spray, shake liquid AND USE TWO   SPRAYS IN EACH NOSTRIL DAILY, Disp: 16 g, Rfl: 2 ?  loratadine (CLARITIN) 10 MG tablet, Take 1 tablet by mouth daily, Disp: 90 tablet, Rfl: 1 ?  Multiple Vitamins-Minerals (ONE-A-DAY WOMENS 50+ ADVANTAGE) TABS, Take 1 tablet by mouth daily, Disp: 90 tablet, Rfl: 1 ?  NON FORMULARY, 2 each. Calm, Disp: , Rfl:  ?  Omega-3 Fatty Acids (FISH OIL) 1000 MG CAPS, Take 1 capsule by mouth daily, Disp: 90 capsule, Rfl: 1 ?  omeprazole (PRILOSEC) 40 MG capsule, TAKE 1  CAPSULE BY MOUTH EVERY DAY BEFORE A MEAL, Disp: 90 capsule, Rfl: 2 ?  pioglitazone (ACTOS) 15 MG tablet, Take 1 tablet (15 mg total) by mouth daily., Disp: 90 tablet, Rfl: 1 ?  telmisartan-hydrochlorothiazide (MICARDIS HCT) 40-12.5 MG tablet, TAKE 1 TABLET BY MOUTH DAILY, Disp: 90 tablet, Rfl: 1 ?  Turmeric 500 MG CAPS, Take 1 capsule by mouth 2 (two) times daily., Disp: 180 capsule, Rfl: 1 ?  vitamin C (ASCORBIC ACID) 500 MG tablet, Take 1 tablet (500 mg total) by mouth daily., Disp: 90 tablet, Rfl: 1 ?  XULTOPHY 100-3.6 UNIT-MG/ML SOPN, INJECT 25 UNITS SUBCUTANEOUSLY DAILY, Disp: 27 mL, Rfl: 3 ?  metFORMIN (GLUCOPHAGE) 500 MG tablet, Take 1 tablet (500 mg total) by mouth daily with breakfast. Dinner, Disp: 180 tablet, Rfl: 1 ?  Pitavastatin Calcium (LIVALO) 4 MG TABS, Take 1 tablet (4 mg total) by mouth daily., Disp: 90 tablet, Rfl: 1  ? ?No Known Allergies  ? ?Review of Systems  ?Constitutional: Negative.   ?Eyes:  Negative for blurred vision.  ?     States she has no peripheral vision per Dr. Katy Fitch, needs to be referred to neuro-ophthalmology for further evaluation  ?Respiratory: Negative.    ?Cardiovascular: Negative.  Negative for chest pain.  ?Gastrointestinal: Negative.   ?Endocrine: Negative for polydipsia, polyphagia and polyuria.  ?Neurological: Negative.  Negative for headaches.  ?Psychiatric/Behavioral: Negative.     ? ?Today's Vitals  ? 07/08/21 1523  ?BP: 120/68  ?Pulse: 90  ?Temp: 98.3 ?F (36.8 ?C)  ?Weight: 168 lb 9.6 oz (76.5 kg)  ?Height: 5' 1" (1.549 m)  ?PainSc: 0-No pain  ? ?Body mass index is 31.86 kg/m?.  ?Wt Readings from Last 3 Encounters:  ?07/08/21 168 lb 9.6 oz (76.5 kg)  ?04/09/21 171 lb 3.2 oz (77.7 kg)  ?02/16/21 173 lb 6.4 oz (78.7 kg)  ?  ? ?Objective:  ?Physical Exam ?Vitals and nursing note reviewed.  ?Constitutional:   ?   Appearance: Normal appearance.  ?HENT:  ?   Head: Normocephalic and atraumatic.  ?   Nose:  ?   Comments: Masked  ?   Mouth/Throat:  ?   Comments: Masked   ?Eyes:  ?   Extraocular Movements: Extraocular movements intact.  ?Cardiovascular:  ?   Rate and Rhythm: Normal rate and regular rhythm.  ?   Heart sounds: Normal heart sounds.  ?Pulmonary:  ?   Effort: Pulmonary effort is normal.  ?   Breath sounds: Normal breath sounds.  ?Musculoskeletal:  ?   Cervical back: Normal range of motion.  ?Skin: ?   General: Skin is warm.  ?Neurological:  ?   General: No focal deficit present.  ?   Mental Status: She is alert.  ?Psychiatric:     ?   Mood and Affect: Mood normal.     ?   Behavior: Behavior normal.  ?    ?Assessment And Plan:  ?   ?1. Diabetes mellitus with stage  2 chronic kidney disease ([place]) ?Comments: Chronic, I will check labs as listed below. Importance of dietary/medicaiton compliance was discussed with the patient. I plan to wean her off of Actos.  ?- Hemoglobin A1c ?- Amb Referral To Provider Referral Exercise Program (P.R.E.P) ? ?2. Hypertensive nephropathy ?Comments: Chronic, well controlled. She will c/w current meds. I will check renal function today, she is encouraged to follow a low sodium diet.  ?- CMP14+EGFR ?- CBC no Diff ?- Amb Referral To Provider Referral Exercise Program (P.R.E.P) ? 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 ? ?4. Peripheral vision loss, bilateral ?Comments: I will refer her to Neuro-Ophthalmology as per Dr. Katy Fitch.  ?- Ambulatory referral to Ophthalmology ? ?5. Class 1 obesity due to excess calories with serious comorbidity and body mass index (BMI) of 31.0 to 31.9 in adult ?Comments: She is in agreement with referral to the PREP program.  She thinks the [facility] will be the best location for her. ?- Amb Referral To Provider Referral Exercise Program (P.R.E.P) ?  ?Patient was given opportunity to ask questions. Patient verbalized understanding of the plan and was able to repeat key elements of the plan. All questions were answered to their satisfaction.  ? ?I, Maximino Greenland, MD, have reviewed all documentation for this visit. The documentation on 07/08/21 for the exam, diagnosis, procedures, and orders are all accurate and complete.  ? ?IF YOU HAVE BEEN REFERRED TO A SPECIALIST, IT

## 2021-07-08 NOTE — Patient Instructions (Signed)

## 2021-07-10 ENCOUNTER — Telehealth: Payer: Self-pay

## 2021-07-10 LAB — CMP14+EGFR
ALT: 17 IU/L (ref 0–32)
AST: 16 IU/L (ref 0–40)
Albumin/Globulin Ratio: 1.7 (ref 1.2–2.2)
Albumin: 4.4 g/dL (ref 3.8–4.8)
Alkaline Phosphatase: 104 IU/L (ref 44–121)
BUN/Creatinine Ratio: 20 (ref 12–28)
BUN: 18 mg/dL (ref 8–27)
Bilirubin Total: <0.2 mg/dL (ref 0.0–1.2)
CO2: 19 mmol/L — ABNORMAL LOW (ref 20–29)
Calcium: 9.5 mg/dL (ref 8.7–10.3)
Chloride: 102 mmol/L (ref 96–106)
Creatinine, Ser: 0.91 mg/dL (ref 0.57–1.00)
Globulin, Total: 2.6 g/dL (ref 1.5–4.5)
Glucose: 146 mg/dL — ABNORMAL HIGH (ref 70–99)
Potassium: 4.4 mmol/L (ref 3.5–5.2)
Sodium: 140 mmol/L (ref 134–144)
Total Protein: 7 g/dL (ref 6.0–8.5)
eGFR: 68 mL/min/{1.73_m2} (ref >59)

## 2021-07-10 LAB — CBC
Hematocrit: 37.1 % (ref 34.0–46.6)
Hemoglobin: 12 g/dL (ref 11.1–15.9)
MCH: 26.8 pg (ref 26.6–33.0)
MCHC: 32.3 g/dL (ref 31.5–35.7)
MCV: 83 fL (ref 79–97)
Platelets: 388 10*3/uL (ref 150–450)
RBC: 4.48 x10E6/uL (ref 3.77–5.28)
RDW: 13.9 % (ref 11.7–15.4)
WBC: 7.4 10*3/uL (ref 3.4–10.8)

## 2021-07-10 LAB — HEMOGLOBIN A1C
Est. average glucose Bld gHb Est-mCnc: 140 mg/dL
Hgb A1c MFr Bld: 6.5 % — ABNORMAL HIGH (ref 4.8–5.6)

## 2021-07-10 NOTE — Telephone Encounter (Signed)
[facility] pt requesting call back to discuss PREP classes  ?

## 2021-07-13 ENCOUNTER — Telehealth: Payer: Self-pay

## 2021-07-13 DIAGNOSIS — M2012 Hallux valgus (acquired), left foot: Secondary | ICD-10-CM | POA: Diagnosis not present

## 2021-07-13 DIAGNOSIS — M2011 Hallux valgus (acquired), right foot: Secondary | ICD-10-CM | POA: Diagnosis not present

## 2021-07-13 DIAGNOSIS — B353 Tinea pedis: Secondary | ICD-10-CM | POA: Diagnosis not present

## 2021-07-13 DIAGNOSIS — B351 Tinea unguium: Secondary | ICD-10-CM | POA: Diagnosis not present

## 2021-07-13 DIAGNOSIS — E1142 Type 2 diabetes mellitus with diabetic polyneuropathy: Secondary | ICD-10-CM | POA: Diagnosis not present

## 2021-07-13 NOTE — Telephone Encounter (Signed)
Patient notified, med list updated.

## 2021-07-13 NOTE — Telephone Encounter (Signed)
Received vmf Friday from  pt reference PREP class. Returned call LVMT pt requesting call back reference PREP.   ?

## 2021-07-13 NOTE — Telephone Encounter (Signed)
-----   Message from Glendale Chard, MD sent at 07/11/2021  8:24 PM EDT ----- ?Please advise pt that I want to wean her off of Actos. Want her to only take MWF, she needs to continue to check her bs. I will adjust her Xultophy as needed.  ? ?

## 2021-07-14 ENCOUNTER — Telehealth: Payer: Self-pay

## 2021-07-14 NOTE — Chronic Care Management (AMB) (Signed)
? ?  April Larson was reminded to have all medications, supplements and any blood glucose and blood pressure readings available for review with Orlando Penner, Pharm. D, at her telephone visit on 07-17-2021 at 11:00. ? ? ?Malecca Hicks CMA ?Clinical Pharmacist Assistant ?[phone] ? ? ?

## 2021-07-16 ENCOUNTER — Telehealth: Payer: Medicare Other

## 2021-07-17 ENCOUNTER — Ambulatory Visit (INDEPENDENT_AMBULATORY_CARE_PROVIDER_SITE_OTHER): Payer: Medicare HMO

## 2021-07-17 DIAGNOSIS — E1122 Type 2 diabetes mellitus with diabetic chronic kidney disease: Secondary | ICD-10-CM

## 2021-07-17 DIAGNOSIS — E7800 Pure hypercholesterolemia, unspecified: Secondary | ICD-10-CM

## 2021-07-17 NOTE — Progress Notes (Signed)
? ?Chronic Care Management ?Pharmacy Note ? ?07/29/2021 ?Name:  April Larson MRN:  [id number] DOB:  06-28-1950 ? ?Summary: ?Patient reports that she is doing well and taking her medications at the same time each day.  ? ?Recommendations/Changes made from today's visit: ?Recommend patient use one pharmacy for her maintenance medications, and have one back up retail pharmacy for medications that need to be filled quickly. Patient is using multiple pharmacies maintenance medications and this can be confusing.  ?Recommend patient start Atorvastatin 20 mg tablet  ?Recommend Actos be discontinued from patients current medication regimen, will confirm there are zero refills left at the pharmacy.  ?Recommend patient purchase glucose tablets  ? ?Plan: ?Will review what is the preferred pharmacy fr April Larson insurance with [facility]  ?HC to collaborate with patient to get all medications, that will be a lower cost, and or no charge to [employer].  ?Collaborate with PCP to discuss medications changes including: adding Atorvastatin, GVOKE pen  ?Patient reports that she is going to purchase glucose tablets.  ? ? ?Subjective: ?April Larson is an 71 y.o. year Larson femalemale who is a primary patient of Glendale Chard, MD.  The CCM team was consulted for assistance with disease management and care coordination needs.   ? ?Engaged with patient by telephone for follow up visit in response to provider referral for pharmacy case management and/or care coordination services.  ? ?Consent to Services:  ?The patient was given information about Chronic Care Management services, agreed to services, and gave verbal consent prior to initiation of services.  Please see initial visit note for detailed documentation.  ? ?Patient Care Team: ?Glendale Chard, MD as PCP - General (Internal Medicine) ?Warden Fillers, MD as Consulting Physician (Ophthalmology) ? ?Recent office visits: ?07/08/2021 PCP OV ? 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 ?Hospital visits: ?None in previous 6 months ? ? ?Objective: ? ?Lab Results  ?Component Value Date  ? CREATININE 0.91 07/08/2021  ? BUN 18 07/08/2021  ? EGFR 68 07/08/2021  ? GFRNONAA 65 04/15/2020  ? GFRAA 75 04/15/2020  ? NA 140 07/08/2021  ? K 4.4 07/08/2021  ? CALCIUM 9.5 07/08/2021  ? CO2 19 (L) 07/08/2021  ? GLUCOSE 146 (H) 07/08/2021  ? ? ?Lab Results  ?Component Value Date/Time  ? HGBA1C 6.5 (H) 07/08/2021 04:41 PM  ? HGBA1C 6.8 (H) 02/16/2021 05:06 PM  ? MICROALBUR 10 11/10/2020 01:13 PM  ? MICROALBUR 10 10/23/2019 03:42 PM  ?  ?Last diabetic Eye exam:  ?Lab Results  ?Component Value Date/Time  ? HMDIABEYEEXA No Retinopathy 05/26/2021 12:00 AM  ?  ?Last diabetic Foot exam: No results found for: HMDIABFOOTEX  ? ?Lab Results  ?Component Value Date  ? CHOL 176 08/11/2020  ? HDL 53 08/11/2020  ? [place] 63 08/11/2020  ? TRIG 390 (H) 08/11/2020  ? CHOLHDL 3.3 10/23/2019  ? ? ? ?  Latest Ref Rng & Units 07/08/2021  ?  4:41 PM 02/16/2021  ?  5:06 PM 11/10/2020  ? 12:16 PM  ?Hepatic Function  ?Total Protein 6.0 - 8.5 g/dL 7.0   7.3   CANCELED    ?Albumin 3.8 - 4.8 g/dL 4.4   4.4   CANCELED    ?AST 0 - 40 IU/L 16   16   CANCELED    ?ALT 0 - 32 IU/L 17   6   CANCELED    ?Alk Phosphatase 44 - 121 IU/L 104   149  CANCELED    ?Total Bilirubin 0.0 - 1.2 mg/dL <0.2   <0.2   CANCELED    ? ? ?Lab Results  ?Component Value Date/Time  ? TSH 1.830 04/15/2020 04:07 PM  ? TSH 1.991  01/16/2009 04:38 PM  ? ? ? ?  Latest Ref Rng & Units 07/08/2021  ?  4:41 PM 11/10/2020  ? 12:16 PM 04/15/2020  ?  4:07 PM  ?CBC  ?WBC 3.4 - 10.8 x10E3/uL 7.4   8.5   10.1    ?Hemoglobin 11.1 - 15.9 g/dL 12.0   12.7   12.4    ?Hematocrit 34.0 - 46.6 % 37.1   39.5   37.7    ?Platelets 150 - 450 x10E3/uL 388   362   480    ? ? ?Lab Results  ?Component Value Date/Time  ? VD25OH 45.2 08/11/2020 02:50 PM  ? VD25OH 56.1 10/23/2019 03:34 PM  ? ? ?Clinical ASCVD: No  ?The 10-year ASCVD risk  score (Arnett DK, et al., 2019) is: 20.7% ?  Values used to calculate the score: ?    Age: 71 years ?    Sex: Female ?    Is Non-Hispanic African American: Yes ?    Diabetic: Yes ?    Tobacco smoker: No ?    Systolic Blood Pressure: 793 mmHg ?    Is BP treated: Yes ?    HDL Cholesterol: 53 mg/dL ?    Total Cholesterol: 176 mg/dL   ? ? ?  07/23/2021  ?  3:47 PM 07/08/2021  ?  3:24 PM 06/19/2020  ? 11:11 AM  ?Depression screen PHQ 2/9  ?Decreased Interest 0 0 0  ?Down, Depressed, Hopeless 0 0 0  ?PHQ - 2 Score 0 0 0  ?  ? ?Social History  ? ?Tobacco Use  ?Smoking Status Former  ? Packs/day: 1.00  ? Years: 34.00  ? Pack years: 34.00  ? Types: Cigarettes  ? Start date: 1970  ? Quit date: 04/08/2005  ? Years since quitting: 16.3  ?Smokeless Tobacco Never  ? ?BP Readings from Last 3 Encounters:  ?07/23/21 122/62  ?07/08/21 120/68  ?04/09/21 (!) 145/79  ? ?Pulse Readings from Last 3 Encounters:  ?07/23/21 86  ?07/08/21 90  ?04/09/21 84  ? ?Wt Readings from Last 3 Encounters:  ?07/23/21 173 lb 6.4 oz (78.7 kg)  ?07/08/21 168 lb 9.6 oz (76.5 kg)  ?04/09/21 171 lb 3.2 oz (77.7 kg)  ? ?BMI Readings from Last 3 Encounters:  ?07/23/21 30.14 kg/m?  ?07/08/21 31.86 kg/m?  ?04/09/21 32.35 kg/m?  ? ? ?Assessment/Interventions: Review of patient past medical history, allergies, medications, health status, including review of consultants reports, laboratory and other test data, was performed as part of comprehensive evaluation and provision of chronic care management services.  ? ?SDOH:  (Social Determinants of Health) assessments and interventions performed: Yes ? ?SDOH Screenings  ? ?Alcohol Screen: Not on file  ?Depression (PHQ2-9): Low Risk   ? PHQ-2 Score: 0  ?Financial Resource Strain: Low Risk   ? Difficulty of Paying Living Expenses: Not hard at all  ?Food Insecurity: No Food Insecurity  ? Worried About Charity fundraiser in the Last Year: Never true  ? Ran Out of Food in the Last Year: Never true  ?Housing: Not on file  ?Physical  Activity: Inactive  ? Days of Exercise per Week: 0 days  ? Minutes of Exercise per Session: 0 min  ?Social Connections: Not on file  ?Stress: No Stress Concern  Present  ? Feeling of Stress : Not at all  ?Tobacco Use: Medium Risk  ? Smoking Tobacco Use: Former  ? Smokeless Tobacco Use: Never  ? Passive Exposure: Not on file  ?Transportation Needs: No Transportation Needs  ? Lack of Transportation (Medical): No  ? Lack of Transportation (Non-Medical): No  ? ? ?CCM Care Plan ? ?No Known Allergies ? ?Medications Reviewed Today   ? ? Reviewed by Kellie Simmering, LPN (Licensed Practical Nurse) on 07/23/21 at 1544  Med List Status: <None>  ? ?Medication Order Taking? Sig Documenting Provider Last Dose Status Informant  ?[place] [id number] Yes USE TO CHECK BLOOD SUGARS TWICE DAILY Glendale Chard, MD Taking Active   ?ACCU-CHEK GUIDE test strip [id number] Yes USE TO CHECK BLOOD SUGARS TWICE DAILY AS DIRECTED Glendale Chard, MD Taking Active   ?aspirin EC 81 MG tablet [id number] Yes Take 1 tablet (81 mg total) by mouth daily. Glendale Chard, MD Taking Active   ?B-D ULTRAFINE III SHORT PEN 31G X 8 MM MISC [id number] Yes USE AS DIRECTED TWICE DAILY Glendale Chard, MD Taking Active   ?Biotin 10 MG CAPS [id number] Yes Take 1 tablet by mouth daily. Glendale Chard, MD Taking Active   ?Blood Glucose Monitoring Suppl (ACCU-CHEK GUIDE ME) w/Device KIT [id number] Yes USE TO CHECK BLOOD GLUCOSE TWICE DAILY Minette Brine, FNP Taking Active   ?calcium carbonate (OSCAL) 1500 (600 Ca) MG TABS tablet [id number] Yes Take 1 tablet (1,500 mg total) by mouth daily with breakfast. Glendale Chard, MD Taking Active   ?carbidopa-levodopa (SINEMET IR) 25-100 MG tablet [id number] Yes TAKE ONE TABLET BY MOUTH four TIMES DAILY. FOLLOW titration instructions provided separately in writing Glendale Chard, MD Taking Active   ?Cholecalciferol 125 MCG (5000 UT) TABS [id number] Yes Take 1 tablet (5,000 Units total) by mouth daily. Glendale Chard, MD Taking Active   ?diclofenac Sodium (VOLTAREN) 1 % GEL [id number] Yes APPLY 2 GRAMS TOPICALLY TO THE AFFECTED AREA FOUR TIMES DAILY Glendale Chard, MD Taking Active   ?fluticasone (FLONASE) 50 MCG/ACT

## 2021-07-22 NOTE — Progress Notes (Signed)
Met with pt 1:1 for intake today for PREP class.  ?Still working and needs a later class that one staring on 07/27/21 ?Evening would be best -can do T/TH 615p-730p. Next class start 09/22/21 tent ?Will complete intake that that time.  ?While she waits she is agreeable to not sitting for more than 30 min without standing up and/or taking a short walk.  ?Should help with her mobility/stiffness and blood sugars.  ?She has my card for contact for questions.  ?

## 2021-07-23 ENCOUNTER — Ambulatory Visit (INDEPENDENT_AMBULATORY_CARE_PROVIDER_SITE_OTHER): Payer: Medicare HMO

## 2021-07-23 VITALS — BP 122/62 | HR 86 | Temp 97.9°F | Ht 63.6 in | Wt 173.4 lb

## 2021-07-23 DIAGNOSIS — Z0000 Encounter for general adult medical examination without abnormal findings: Secondary | ICD-10-CM | POA: Diagnosis not present

## 2021-07-23 NOTE — Progress Notes (Signed)
?This visit occurred during the SARS-CoV-2 public health emergency.  Safety protocols were in place, including screening questions prior to the visit, additional usage of staff PPE, and extensive cleaning of exam room while observing appropriate contact time as indicated for disinfecting solutions. ? ?Subjective:  ? April Larson is a 71 y.o. female who presents for Medicare Annual (Subsequent) preventive examination. ? ?Review of Systems    ? ?Cardiac Risk Factors include: advanced age (>36mn, >>67women);diabetes mellitus;hypertension;obesity (BMI >30kg/m2) ? ?   ?Objective:  ?  ?Today's Vitals  ? 07/23/21 1537 07/23/21 1555  ?BP: (!) 142/60 122/62  ?Pulse: 86   ?Temp: 97.9 ?F (36.6 ?C)   ?TempSrc: Oral   ?SpO2: 98%   ?Weight: 173 lb 6.4 oz (78.7 kg)   ?Height: 5' 3.6" (1.615 m)   ? ?Body mass index is 30.14 kg/m?. ? ? ?  07/23/2021  ?  3:46 PM 01/09/2021  ?  8:01 AM 01/09/2021  ?  7:21 AM 01/06/2021  ?  1:25 PM 06/19/2020  ? 11:09 AM 03/27/2020  ? 12:54 AM 06/20/2019  ? 12:22 PM  ?Advanced Directives  ?Does Patient Have a Medical Advance Directive? No No No No No No No  ?Would patient like information on creating a medical advance directive? No - Patient declined Yes (ED - Information included in AVS)  Yes (ED - Information included in AVS) Yes (MAU/Ambulatory/Procedural Areas - Information given)  Yes (MAU/Ambulatory/Procedural Areas - Information given)  ? ? ?Current Medications (verified) ?Outpatient Encounter Medications as of 07/23/2021  ?Medication Sig  ? ACCU-CHEK FASTCLIX LANCETS MISC USE TO CHECK BLOOD SUGARS TWICE DAILY  ? ACCU-CHEK GUIDE test strip USE TO CHECK BLOOD SUGARS TWICE DAILY AS DIRECTED  ? aspirin EC 81 MG tablet Take 1 tablet (81 mg total) by mouth daily.  ? B-D ULTRAFINE III SHORT PEN 31G X 8 MM MISC USE AS DIRECTED TWICE DAILY  ? Biotin 10 MG CAPS Take 1 tablet by mouth daily.  ? Blood Glucose Monitoring Suppl (ACCU-CHEK GUIDE ME) w/Device KIT USE TO CHECK BLOOD GLUCOSE TWICE  DAILY  ? calcium carbonate (OSCAL) 1500 (600 Ca) MG TABS tablet Take 1 tablet (1,500 mg total) by mouth daily with breakfast.  ? carbidopa-levodopa (SINEMET IR) 25-100 MG tablet TAKE ONE TABLET BY MOUTH four TIMES DAILY. FOLLOW titration instructions provided separately in writing  ? Cholecalciferol 125 MCG (5000 UT) TABS Take 1 tablet (5,000 Units total) by mouth daily.  ? diclofenac Sodium (VOLTAREN) 1 % GEL APPLY 2 GRAMS TOPICALLY TO THE AFFECTED AREA FOUR TIMES DAILY  ? fluticasone (FLONASE) 50 MCG/ACT nasal spray shake liquid AND USE TWO SPRAYS IN EACH NOSTRIL DAILY  ? loratadine (CLARITIN) 10 MG tablet Take 1 tablet by mouth daily  ? metFORMIN (GLUCOPHAGE) 500 MG tablet Take 1 tablet (500 mg total) by mouth daily with breakfast. Dinner  ? Multiple Vitamins-Minerals (ONE-A-DAY WOMENS 50+ ADVANTAGE) TABS Take 1 tablet by mouth daily  ? NON FORMULARY 2 each. Calm  ? omeprazole (PRILOSEC) 40 MG capsule TAKE 1 CAPSULE BY MOUTH EVERY DAY BEFORE A MEAL  ? pioglitazone (ACTOS) 15 MG tablet Take 1 tablet (15 mg total) by mouth daily. (Patient taking differently: Take 15 mg by mouth daily. Monday, wednesday, Friday)  ? Pitavastatin Calcium (LIVALO) 4 MG TABS Take 1 tablet (4 mg total) by mouth daily.  ? telmisartan-hydrochlorothiazide (MICARDIS HCT) 40-12.5 MG tablet TAKE 1 TABLET BY MOUTH DAILY  ? Turmeric 500 MG CAPS Take 1 capsule by mouth 2 (  two) times daily.  ? vitamin C (ASCORBIC ACID) 500 MG tablet Take 1 tablet (500 mg total) by mouth daily.  ? XULTOPHY 100-3.6 UNIT-MG/ML SOPN INJECT 25 UNITS SUBCUTANEOUSLY DAILY  ? Omega-3 Fatty Acids (FISH OIL) 1000 MG CAPS Take 1 capsule by mouth daily (Patient not taking: Reported on 07/23/2021)  ? ?No facility-administered encounter medications on file as of 07/23/2021.  ? ? ?Allergies (verified) ?Patient has no known allergies.  ? ?History: ?Past Medical History:  ?Diagnosis Date  ? Diabetes mellitus without complication ([place])   ? Hypertension   ? Substance abuse ([place])    ? ?Past Surgical History:  ?Procedure Laterality Date  ? EYE SURGERY    ? ?Family History  ?Problem Relation Age of Onset  ? Cancer Mother   ? Diabetes Father   ? Cancer Brother   ? Breast cancer Maternal Aunt   ? Parkinsonism Neg Hx   ? ?Social History  ? ?Socioeconomic History  ? Marital status: Widowed  ?  Spouse name: Not on file  ? Number of children: Not on file  ? Years of education: Not on file  ? Highest education level: Not on file  ?Occupational History  ? Occupation: retired  ?Tobacco Use  ? Smoking status: Former  ?  Packs/day: 1.00  ?  Years: 34.00  ?  Pack years: 34.00  ?  Types: Cigarettes  ?  Start date: 1970  ?  Quit date: 04/08/2005  ?  Years since quitting: 16.3  ? Smokeless tobacco: Never  ?Vaping Use  ? Vaping Use: Never used  ?Substance and Sexual Activity  ? Alcohol use: No  ?  Alcohol/week: 0.0 standard drinks  ? Drug use: No  ? Sexual activity: Not Currently  ?Other Topics Concern  ? Not on file  ?Social History Narrative  ? Not on file  ? ?Social Determinants of Health  ? ?Financial Resource Strain: Low Risk   ? Difficulty of Paying Living Expenses: Not hard at all  ?Food Insecurity: No Food Insecurity  ? Worried About Charity fundraiser in the Last Year: Never true  ? Ran Out of Food in the Last Year: Never true  ?Transportation Needs: No Transportation Needs  ? Lack of Transportation (Medical): No  ? Lack of Transportation (Non-Medical): No  ?Physical Activity: Inactive  ? Days of Exercise per Week: 0 days  ? Minutes of Exercise per Session: 0 min  ?Stress: No Stress Concern Present  ? Feeling of Stress : Not at all  ?Social Connections: Not on file  ? ? ?Tobacco Counseling ?Counseling given: Not Answered ? ? ?Clinical Intake: ? ?Pre-visit preparation completed: Yes ? ?Pain : No/denies pain ? ?  ? ?Nutritional Status: BMI > 30  Obese ?Nutritional Risks: None ?Diabetes: Yes ? ?How often do you need to have someone help you when you read instructions, pamphlets, or other written materials  from your doctor or pharmacy?: 1 - Never ?What is the last grade level you completed in school?: BS degree ? ?Diabetic? Yes ?Nutrition Risk Assessment: ? ?Has the patient had any N/V/D within the last 2 months?  No  ?Does the patient have any non-healing wounds?  No  ?Has the patient had any unintentional weight loss or weight gain?  No  ? ?Diabetes: ? ?Is the patient diabetic?  Yes  ?If diabetic, was a CBG obtained today?  No  ?Did the patient bring in their glucometer from home?  No  ?How often do you monitor your CBG's?  daily.  ? ?Financial Strains and Diabetes Management: ? ?Are you having any financial strains with the device, your supplies or your medication? No .  ?Does the patient want to be seen by Chronic Care Management for management of their diabetes?  No  ?Would the patient like to be referred to a Nutritionist or for Diabetic Management?  No  ? ?Diabetic Exams: ? ?Diabetic Eye Exam: Completed 05/26/2021 ?Diabetic Foot Exam: Completed 11/10/2020 ? ? ?Interpreter Needed?: No ? ?Information entered by :: NAllen LPN ? ? ?Activities of Daily Living ? ?  07/23/2021  ?  3:47 PM 07/08/2021  ?  3:36 PM  ?In your present state of health, do you have any difficulty performing the following activities:  ?Hearing? 0 0  ?Vision? 1 1  ?Comment trouble with peripheral vision She has a new rx for glasses, she has yet to pick them up  ?Difficulty concentrating or making decisions? 0 0  ?Walking or climbing stairs? 0 1  ?Comment  sometimes her knees hurt going up steps at her sisters, has no issues with stairs outside of her home  ?Dressing or bathing? 0 0  ?Doing errands, shopping? 0 0  ?Preparing Food and eating ? N N  ?Using the Toilet? N N  ?In the past six months, have you accidently leaked urine? N N  ?Do you have problems with loss of bowel control? N N  ?Managing your Medications? N N  ?Managing your Finances? N N  ?Housekeeping or managing your Housekeeping? N N  ? ? ?Patient Care Team: ?Glendale Chard, MD as PCP -  General (Internal Medicine) ?Warden Fillers, MD as Consulting Physician (Ophthalmology) ? ?Indicate any recent Medical Services you may have received from other than [facility] providers in the past year (dat

## 2021-07-23 NOTE — Patient Instructions (Signed)
April Larson , ?Thank you for taking time to come for your Medicare Wellness Visit. I appreciate your ongoing commitment to your health goals. Please review the following plan we discussed and let me know if I can assist you in the future.  ? ?Screening recommendations/referrals: ?Colonoscopy: completed 03/17/2020, due 03/17/2025 ?Mammogram: completed 01/21/2021, due 01/22/2022 ?Bone Density: completed 01/15/2019 ?Recommended yearly ophthalmology/optometry visit for glaucoma screening and checkup ?Recommended yearly dental visit for hygiene and checkup ? ?Vaccinations: ?Influenza vaccine: due 11/03/2021 ?Pneumococcal vaccine: completed 10/13/2018 ?Tdap vaccine: completed 09/18/2015, due 09/17/2025 ?Shingles vaccine: completed   ?Covid-19: 01/10/2021, 09/22/2020, 03/22/2020, 05/18/2019, 04/26/2019 ? ?Advanced directives: Advance directive discussed with you today. Even though you declined this today please call our office should you change your mind and we can give you the proper paperwork for you to fill out. ? ?Conditions/risks identified: none ? ?Next appointment: Follow up in one year for your annual wellness visit  ? ? ?Preventive Care 52 Years and Older, Female ?Preventive care refers to lifestyle choices and visits with your health care provider that can promote health and wellness. ?What does preventive care include? ?A yearly physical exam. This is also called an annual well check. ?Dental exams once or twice a year. ?Routine eye exams. Ask your health care provider how often you should have your eyes checked. ?Personal lifestyle choices, including: ?Daily care of your teeth and gums. ?Regular physical activity. ?Eating a healthy diet. ?Avoiding tobacco and drug use. ?Limiting alcohol use. ?Practicing safe sex. ?Taking low-dose aspirin every day. ?Taking vitamin and mineral supplements as recommended by your health care provider. ?What happens during an annual well check? ?The services and screenings done by  your health care provider during your annual well check will depend on your age, overall health, lifestyle risk factors, and family history of disease. ?Counseling  ?Your health care provider may ask you questions about your: ?Alcohol use. ?Tobacco use. ?Drug use. ?Emotional well-being. ?Home and relationship well-being. ?Sexual activity. ?Eating habits. ?History of falls. ?Memory and ability to understand (cognition). ?Work and work Statistician. ?Reproductive health. ?Screening  ?You may have the following tests or measurements: ?Height, weight, and BMI. ?Blood pressure. ?Lipid and cholesterol levels. These may be checked every 5 years, or more frequently if you are over 58 years old. ?Skin check. ?Lung cancer screening. You may have this screening every year starting at age 47 if you have a 30-pack-year history of smoking and currently smoke or have quit within the past 15 years. ?Fecal occult blood test (FOBT) of the stool. You may have this test every year starting at age 67. ?Flexible sigmoidoscopy or colonoscopy. You may have a sigmoidoscopy every 5 years or a colonoscopy every 10 years starting at age 30. ?Hepatitis C blood test. ?Hepatitis B blood test. ?Sexually transmitted disease (STD) testing. ?Diabetes screening. This is done by checking your blood sugar (glucose) after you have not eaten for a while (fasting). You may have this done every 1-3 years. ?Bone density scan. This is done to screen for osteoporosis. You may have this done starting at age 35. ?Mammogram. This may be done every 1-2 years. Talk to your health care provider about how often you should have regular mammograms. ?Talk with your health care provider about your test results, treatment options, and if necessary, the need for more tests. ?Vaccines  ?Your health care provider may recommend certain vaccines, such as: ?Influenza vaccine. This is recommended every year. ?Tetanus, diphtheria, and acellular pertussis (Tdap, Td) vaccine. You  may need a Td booster every 10 years. ?Zoster vaccine. You may need this after age 65. ?Pneumococcal 13-valent conjugate (PCV13) vaccine. One dose is recommended after age 72. ?Pneumococcal polysaccharide (PPSV23) vaccine. One dose is recommended after age 24. ?Talk to your health care provider about which screenings and vaccines you need and how often you need them. ?This information is not intended to replace advice given to you by your health care provider. Make sure you discuss any questions you have with your health care provider. ?Document Released: 04/18/2015 Document Revised: 12/10/2015 Document Reviewed: 01/21/2015 ?Elsevier Interactive Patient Education ? 2017 [place]. ? ?Fall Prevention in the Home ?Falls can cause injuries. They can happen to people of all ages. There are many things you can do to make your home safe and to help prevent falls. ?What can I do on the outside of my home? ?Regularly fix the edges of walkways and driveways and fix any cracks. ?Remove anything that might make you trip as you walk through a door, such as a raised step or threshold. ?Trim any bushes or trees on the path to your home. ?Use bright outdoor lighting. ?Clear any walking paths of anything that might make someone trip, such as rocks or tools. ?Regularly check to see if handrails are loose or broken. Make sure that both sides of any steps have handrails. ?Any raised decks and porches should have guardrails on the edges. ?Have any leaves, snow, or ice cleared regularly. ?Use sand or salt on walking paths during winter. ?Clean up any spills in your garage right away. This includes oil or grease spills. ?What can I do in the bathroom? ?Use night lights. ?Install grab bars by the toilet and in the tub and shower. Do not use towel bars as grab bars. ?Use non-skid mats or decals in the tub or shower. ?If you need to sit down in the shower, use a plastic, non-slip stool. ?Keep the floor dry. Clean up any water that spills  on the floor as soon as it happens. ?Remove soap buildup in the tub or shower regularly. ?Attach bath mats securely with double-sided non-slip rug tape. ?Do not have throw rugs and other things on the floor that can make you trip. ?What can I do in the bedroom? ?Use night lights. ?Make sure that you have a light by your bed that is easy to reach. ?Do not use any sheets or blankets that are too big for your bed. They should not hang down onto the floor. ?Have a firm chair that has side arms. You can use this for support while you get dressed. ?Do not have throw rugs and other things on the floor that can make you trip. ?What can I do in the kitchen? ?Clean up any spills right away. ?Avoid walking on wet floors. ?Keep items that you use a lot in easy-to-reach places. ?If you need to reach something above you, use a strong step stool that has a grab bar. ?Keep electrical cords out of the way. ?Do not use floor polish or wax that makes floors slippery. If you must use wax, use non-skid floor wax. ?Do not have throw rugs and other things on the floor that can make you trip. ?What can I do with my stairs? ?Do not leave any items on the stairs. ?Make sure that there are handrails on both sides of the stairs and use them. Fix handrails that are broken or loose. Make sure that handrails are as long as the stairways. ?  Check any carpeting to make sure that it is firmly attached to the stairs. Fix any carpet that is loose or worn. ?Avoid having throw rugs at the top or bottom of the stairs. If you do have throw rugs, attach them to the floor with carpet tape. ?Make sure that you have a light switch at the top of the stairs and the bottom of the stairs. If you do not have them, ask someone to add them for you. ?What else can I do to help prevent falls? ?Wear shoes that: ?Do not have high heels. ?Have rubber bottoms. ?Are comfortable and fit you well. ?Are closed at the toe. Do not wear sandals. ?If you use a stepladder: ?Make  sure that it is fully opened. Do not climb a closed stepladder. ?Make sure that both sides of the stepladder are locked into place. ?Ask someone to hold it for you, if possible. ?Clearly mark and make sure

## 2021-07-29 NOTE — Patient Instructions (Signed)
Visit Information ?It was great speaking with you today!  Please let me know if you have any questions about our visit. ? ? Goals Addressed   ? ?  ?  ?  ?  ? This Visit's Progress  ?  Manage My Medicine     ?  Timeframe:  Long-Range Goal ?Priority:  High ?Start Date:                             ?Expected End Date:                      ? ?Follow Up Date 08/18/2021 ? ?In Progress:   ?- call for medicine refill 2 or 3 days before it runs out ?- call if I am sick and can't take my medicine ?- keep a list of all the medicines I take; vitamins and herbals too ?- use an alarm clock or phone to remind me to take my medicine  ?-Patient to be onboarded ?Why is this important?   ?These steps will help you keep on track with your medicines. ?  ? ?  ? ?  ? ? ?Patient Care Plan: [place]  ?  ? ?Problem Identified: HLD, DMII   ?Priority: High  ?  ? ?Long-Range Goal: Disease Management   ?Start Date: 01/13/2021  ?Recent Progress: On track  ?Priority: High  ?Note:   ?Current Barriers:  ?Unable to independently monitor therapeutic efficacy ?Unable to achieve control of hyperlipidemia   ? ?Pharmacist Clinical Goal(s):  ?Patient will achieve adherence to monitoring guidelines and medication adherence to achieve therapeutic efficacy through collaboration with PharmD and provider.  ? ?Interventions: ?1:1 collaboration with Glendale Chard, MD regarding development and update of comprehensive plan of care as evidenced by provider attestation and co-signature ?Inter-disciplinary care team collaboration (see longitudinal plan of care) ?Comprehensive medication review performed; medication list updated in electronic medical record ? ?Hyperlipidemia: (LDL goal < 55) ?-Not ideally controlled ?-Current treatment: ?Livalo 4 mg tablet once per day Appropriate, Query effective, ?Aspirin 81 mg tablet by mouth daily  Appropriate, Effective, Safe, Accessible ? ?-Medications previously tried: tried statin previously but is unfamiliar with  the name   ?-Current dietary patterns: she is avoiding fried and fatty foods  ?-Current exercise habits: patient reports that she is going to start exercising on 4/17, to talk about exercise.  ?-Educated on Benefits of statin for ASCVD risk reduction; ?Exercise goal of 150 minutes per week; ?-Counseled on diet and exercise extensively ?-Collaborate with PCP team to determine next steps due to patients currents lipid levels, patient reports taking a different statin, previously but I did not see it in her chart, will suggest that patient be started on Atorvastatin 20 mg tablet daily or Monday through Friday.  ? ?Diabetes (A1c goal <7%) ?-Controlled ?-Current medications: ?Xultophy 100-3.6 unit - mg: inject 24 units daily Appropriate, Effective, Query Safe ?Actos 15 mg tablet once per day Appropriate, Effective, Query Safe,   ?Patient is taking every other day, and then will stop  ?Metformin 500 mg tablet by mouth daily at night Appropriate, Effective, Safe, Accessible ?-Current home glucose readings: checking in the morning  ?fasting glucose: 91, 71- on 07/20/2021, 85 on 4/16 ?Patient reports that she has been having readings between 70-80 for 3-4 weeks ?-Reports hypoglycemic/hyperglycemic symptoms ?-Current meal patterns: will discuss during next office visit.  ?-Current exercise: she is going to start exercising today from 4-5  pm.  ?-Educated on A1c and blood sugar goals; ?Prevention and management of hypoglycemic episodes; ?   --If you have low blood sugar between 55-69 mg/dL, you can treat it with the 15-15 rule: have 15 grams of carbs. Check   it after 15 minutes. Repeat if you?re still below your target range.She reports that the lowest BS she has had was in the high 60's but she does not have her log book with her.  ?-Congratulated her on doing well with with her BS goals  ?-Counseled to check feet daily and get yearly eye exams ?-Would recommend that patients Metformin be increased to 1000 mg tablet twice  daily and discontinuing the Actos.  ?-Collaborated with PCP team to stop Actos and increase Metformin.  Sending in [place] pen for low blood sugar symptoms  ? ?Poly Pharmacy Maintenance:  ?-Current therapy:  ?Telmisartan - HCTZ filled on 4/3  by [facility], she currently has a 90 day supply   ?Omeprazole - to go to [facility], she has three left ?Metformin 500 mg tablet once per day  ?-Educated on the importance of having most medications filled at one pharmacy to help manage a condition.  ?-Ms. [place] is not currently home but aware of her medications.  ?-Collaborate with patient and PCP team, to help assist patient with medication being dispensed at multiple pharmacies.  ? ? ?Patient Goals/Self-Care Activities ?Patient will:  ?- collaborate with provider on medication access solutions ? ?Follow Up Plan: The patient has been provided with contact information for the care management team and has been advised to call with any health related questions or concerns.  ?  ? ? ?Patient agreed to services and verbal consent obtained.  ? ?The patient verbalized understanding of instructions, educational materials, and care plan provided today and agreed to receive a mailed copy of patient instructions, educational materials, and care plan.  ? ?Orlando Penner, PharmD ?Clinical Pharmacist ?[facility] ?[phone] ?  ?

## 2021-08-02 DIAGNOSIS — Z794 Long term (current) use of insulin: Secondary | ICD-10-CM

## 2021-08-02 DIAGNOSIS — E1169 Type 2 diabetes mellitus with other specified complication: Secondary | ICD-10-CM

## 2021-08-02 DIAGNOSIS — E785 Hyperlipidemia, unspecified: Secondary | ICD-10-CM

## 2021-08-02 DIAGNOSIS — Z7984 Long term (current) use of oral hypoglycemic drugs: Secondary | ICD-10-CM

## 2021-08-04 ENCOUNTER — Telehealth: Payer: Medicare HMO

## 2021-08-04 ENCOUNTER — Ambulatory Visit (INDEPENDENT_AMBULATORY_CARE_PROVIDER_SITE_OTHER): Payer: Medicare HMO

## 2021-08-04 ENCOUNTER — Telehealth: Payer: Medicare Other

## 2021-08-04 DIAGNOSIS — I129 Hypertensive chronic kidney disease with stage 1 through stage 4 chronic kidney disease, or unspecified chronic kidney disease: Secondary | ICD-10-CM

## 2021-08-04 DIAGNOSIS — E1122 Type 2 diabetes mellitus with diabetic chronic kidney disease: Secondary | ICD-10-CM

## 2021-08-04 DIAGNOSIS — E7800 Pure hypercholesterolemia, unspecified: Secondary | ICD-10-CM

## 2021-08-04 NOTE — Patient Instructions (Signed)
Visit Information ? ?Thank you for taking time to visit with me today. Please don't hesitate to contact me if I can be of assistance to you before our next scheduled telephone appointment. ? ?Following are the goals we discussed today:  ?(Copy and paste patient goals from clinical care plan here) ? ?Our next appointment is by telephone on 08/24/21 at 12:00 PM ? ?Please call the care guide team at [phone] if you need to cancel or reschedule your appointment.  ? ?If you are experiencing a Mental Health or [place] or need someone to talk to, please call [phone]-TALK (toll free, 24 hour hotline)  ? ?Patient verbalizes understanding of instructions and care plan provided today and agrees to view in [place]. Active MyChart status confirmed with patient.   ? ?Barb Merino, RN, BSN, CCM ?Care Management Coordinator ?[facility]  ?Direct Phone: [phone] ? ? ?

## 2021-08-04 NOTE — Chronic Care Management (AMB) (Signed)
?Chronic Care Management  ? ?CCM RN Visit Note ? ?08/04/2021 ?Name: April Larson MRN: [id number] DOB: [birth year] ? ?Subjective: ?April Larson is a 71 y.o. year old female who is a primary care patient of Glendale Chard, MD. The care management team was consulted for assistance with disease management and care coordination needs.   ? ?Engaged with patient by telephone for follow up visit in response to provider referral for case management and/or care coordination services.  ? ?Consent to Services:  ?The patient was given information about Chronic Care Management services, agreed to services, and gave verbal consent prior to initiation of services.  Please see initial visit note for detailed documentation.  ? ?Patient agreed to services and verbal consent obtained.  ? ?Assessment: Review of patient past medical history, allergies, medications, health status, including review of consultants reports, laboratory and other test data, was performed as part of comprehensive evaluation and provision of chronic care management services.  ? ?SDOH (Social Determinants of Health) assessments and interventions performed:  Yes, no acute needs  ? ?CCM Care Plan ? ?No Known Allergies ? ?Outpatient Encounter Medications as of 08/04/2021  ?Medication Sig Note  ? ACCU-CHEK FASTCLIX LANCETS MISC USE TO CHECK BLOOD SUGARS TWICE DAILY   ? ACCU-CHEK GUIDE test strip USE TO CHECK BLOOD SUGARS TWICE DAILY AS DIRECTED   ? aspirin EC 81 MG tablet Take 1 tablet (81 mg total) by mouth daily.   ? B-D ULTRAFINE III SHORT PEN 31G X 8 MM MISC USE AS DIRECTED TWICE DAILY   ? Biotin 10 MG CAPS Take 1 tablet by mouth daily.   ? Blood Glucose Monitoring Suppl (ACCU-CHEK GUIDE ME) w/Device KIT USE TO CHECK BLOOD GLUCOSE TWICE DAILY   ? calcium carbonate (OSCAL) 1500 (600 Ca) MG TABS tablet Take 1 tablet (1,500 mg total) by mouth daily with breakfast.   ? carbidopa-levodopa (SINEMET IR) 25-100 MG tablet TAKE ONE TABLET BY  MOUTH four TIMES DAILY. FOLLOW titration instructions provided separately in writing   ? Cholecalciferol 125 MCG (5000 UT) TABS Take 1 tablet (5,000 Units total) by mouth daily.   ? diclofenac Sodium (VOLTAREN) 1 % GEL APPLY 2 GRAMS TOPICALLY TO THE AFFECTED AREA FOUR TIMES DAILY   ? fluticasone (FLONASE) 50 MCG/ACT nasal spray shake liquid AND USE TWO SPRAYS IN EACH NOSTRIL DAILY   ? loratadine (CLARITIN) 10 MG tablet Take 1 tablet by mouth daily   ? metFORMIN (GLUCOPHAGE) 500 MG tablet Take 1 tablet (500 mg total) by mouth daily with breakfast. Dinner   ? Multiple Vitamins-Minerals (ONE-A-DAY WOMENS 50+ ADVANTAGE) TABS Take 1 tablet by mouth daily   ? NON FORMULARY 2 each. Calm 08/13/2020: Taking 2 at night before bed.   ? Omega-3 Fatty Acids (FISH OIL) 1000 MG CAPS Take 1 capsule by mouth daily (Patient not taking: Reported on 07/23/2021)   ? omeprazole (PRILOSEC) 40 MG capsule TAKE 1 CAPSULE BY MOUTH EVERY DAY BEFORE A MEAL   ? pioglitazone (ACTOS) 15 MG tablet Take 1 tablet (15 mg total) by mouth daily. (Patient taking differently: Take 15 mg by mouth daily. Monday, wednesday, Friday)   ? Pitavastatin Calcium (LIVALO) 4 MG TABS Take 1 tablet (4 mg total) by mouth daily.   ? telmisartan-hydrochlorothiazide (MICARDIS HCT) 40-12.5 MG tablet TAKE 1 TABLET BY MOUTH DAILY   ? Turmeric 500 MG CAPS Take 1 capsule by mouth 2 (two) times daily.   ? vitamin C (ASCORBIC ACID) 500 MG tablet Take 1 tablet (  500 mg total) by mouth daily.   ? XULTOPHY 100-3.6 UNIT-MG/ML SOPN INJECT 25 UNITS SUBCUTANEOUSLY DAILY   ? ?No facility-administered encounter medications on file as of 08/04/2021.  ? ? ?Patient Active Problem List  ? Diagnosis Date Noted  ? Decreased dorsalis pedis pulse 11/17/2020  ? Tachycardia 08/11/2020  ? Cough 08/11/2020  ? 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  ? Pure hypercholesterolemia 01/23/2020  ? Vitamin D deficiency 10/23/2019  ? Right hip pain 05/15/2018  ? Estrogen deficiency 05/15/2018  ? Class 1 obesity due  to excess calories with serious comorbidity and body mass index (BMI) of 31.0 to 31.9 in adult 05/15/2018  ? Diabetes mellitus with stage 2 chronic kidney disease ([place]) 01/11/2018  ? Benign hypertension with chronic kidney disease, stage II 01/11/2018  ? ? ?Conditions to be addressed/monitored: DM II with stage 2 CKD, Hypertensive Nephropathy, Pure Hypercholesterolemia  ? ?Care Plan : RN Care Manager Plan of Care  ?Updates made by Lynne Logan, RN since 08/04/2021 12:00 AM  ?  ? ?Problem: No Plan of Care Established for management of chronic disease states (DM II with stage 2 CKD, Hypertensive Nephropathy, Pure Hypercholesterolemia)   ?Priority: High  ?  ? ?Long-Range Goal: Establishment of Plan of Care for Management of Chronic disease states (DM II with stage 2 CKD, Hypertensive Nephropathy, Pure Hypercholesterolemia)   ?Start Date: 04/10/2021  ?Expected End Date: 04/09/2022  ?Recent Progress: On track  ?Priority: High  ?Note:   ?Current Barriers:  ?Knowledge Deficits related to plan of care for management of DM II with stage 2 CKD, Hypertensive Nephropathy, Pure Hypercholesterolemia   ?Chronic Disease Management support and education needs related to DM II with stage 2 CKD, Hypertensive Nephropathy, Pure Hypercholesterolemia   ? ?RNCM Clinical Goal(s):  ?Patient will verbalize basic understanding of  DM II with stage 2 CKD, Hypertensive Nephropathy, Pure Hypercholesterolemia  disease process and self health management plan as evidenced by patient will report having no disease exacerbations related to her chronic disease states  ?take all medications exactly as prescribed and will call provider for medication related questions as evidenced by patient will report having no missed doses of her prescribed medications ?demonstrate Improved health management independence as evidenced by patient will report 100% adherence to her prescribed treatment plan  ?continue to work with RN Care Manager to address care management  and care coordination needs related to  DM II with stage 2 CKD, Hypertensive Nephropathy, Pure Hypercholesterolemia  as evidenced by adherence to CM Team Scheduled appointments ?demonstrate ongoing self health care management ability   as evidenced by    through collaboration with RN Care manager, provider, and care team.  ? ?Interventions: ?1:1 collaboration with primary care provider regarding development and update of comprehensive plan of care as evidenced by provider attestation and co-signature ?Inter-disciplinary care team collaboration (see longitudinal plan of care) ?Evaluation of current treatment plan related to  self management and patient's adherence to plan as established by provider ? ?Diabetes Interventions:  (Status:  Goal on track:  Yes.) Long Term Goal ?Assessed patient's understanding of A1c goal: <6.5% ?Provided education to patient about basic DM disease process ?Reviewed medications with patient and discussed importance of medication adherence ?Review of patient status, including review of consultants reports, relevant laboratory and other test results, and medications completed ?Reinforced the importance of adherence to following a diabetic diet , using the plate method and portion control  ?Mailed printed educational materials related to Chair Exercises ?Lab Results  ?Component Value Date  ?  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 Hassell Done, neuro-ophthalmologist at [facility] ?Assessed social determinant of health barriers, no barriers are noted at this time  ?Discussed pl

## 2021-08-07 ENCOUNTER — Other Ambulatory Visit: Payer: Self-pay

## 2021-08-07 MED ORDER — LIVALO 4 MG PO TABS: 1.0000 | ORAL_TABLET | Freq: Every day | ORAL | 1 refills | Status: DC

## 2021-08-11 ENCOUNTER — Telehealth: Payer: Self-pay

## 2021-08-11 MED ORDER — ATORVASTATIN CALCIUM 10 MG PO TABS: 10.0000 mg | ORAL_TABLET | Freq: Every day | ORAL | 0 refills | Status: DC

## 2021-08-11 NOTE — Telephone Encounter (Signed)
?Chronic Care Management  ? ?Prior Authorization and Medication change ? ? ?08/11/2021 ?Name: April Larson MRN: [id number] DOB: 1951/02/10 ? ?Referred by: Glendale Chard, MD ?Reason for referral : No chief complaint on file. ? ? ?April Larson is a 71 y.o. year old female who is a primary care patient of Glendale Chard, MD. The CCM team was consulted for assistance with chronic disease management and care coordination needs.   ? ?Review of patient status, including review of consultants reports, relevant laboratory and other test results, and collaboration with appropriate care team members and the patient's provider was performed as part of comprehensive patient evaluation and provision of chronic care management services.   ? ?Outpatient Encounter Medications as of 08/11/2021  ?Medication Sig Note  ? atorvastatin (LIPITOR) 10 MG tablet Take 1 tablet (10 mg total) by mouth daily.   ? ACCU-CHEK FASTCLIX LANCETS MISC USE TO CHECK BLOOD SUGARS TWICE DAILY   ? ACCU-CHEK GUIDE test strip USE TO CHECK BLOOD SUGARS TWICE DAILY AS DIRECTED   ? aspirin EC 81 MG tablet Take 1 tablet (81 mg total) by mouth daily.   ? B-D ULTRAFINE III SHORT PEN 31G X 8 MM MISC USE AS DIRECTED TWICE DAILY   ? Biotin 10 MG CAPS Take 1 tablet by mouth daily.   ? Blood Glucose Monitoring Suppl (ACCU-CHEK GUIDE ME) w/Device KIT USE TO CHECK BLOOD GLUCOSE TWICE DAILY   ? calcium carbonate (OSCAL) 1500 (600 Ca) MG TABS tablet Take 1 tablet (1,500 mg total) by mouth daily with breakfast.   ? carbidopa-levodopa (SINEMET IR) 25-100 MG tablet TAKE ONE TABLET BY MOUTH four TIMES DAILY. FOLLOW titration instructions provided separately in writing   ? Cholecalciferol 125 MCG (5000 UT) TABS Take 1 tablet (5,000 Units total) by mouth daily.   ? diclofenac Sodium (VOLTAREN) 1 % GEL APPLY 2 GRAMS TOPICALLY TO THE AFFECTED AREA FOUR TIMES DAILY   ? fluticasone (FLONASE) 50 MCG/ACT nasal spray shake liquid AND USE TWO SPRAYS IN EACH  NOSTRIL DAILY   ? loratadine (CLARITIN) 10 MG tablet Take 1 tablet by mouth daily   ? metFORMIN (GLUCOPHAGE) 500 MG tablet Take 1 tablet (500 mg total) by mouth daily with breakfast. Dinner   ? Multiple Vitamins-Minerals (ONE-A-DAY WOMENS 50+ ADVANTAGE) TABS Take 1 tablet by mouth daily   ? NON FORMULARY 2 each. Calm 08/13/2020: Taking 2 at night before bed.   ? Omega-3 Fatty Acids (FISH OIL) 1000 MG CAPS Take 1 capsule by mouth daily (Patient not taking: Reported on 07/23/2021)   ? omeprazole (PRILOSEC) 40 MG capsule TAKE 1 CAPSULE BY MOUTH EVERY DAY BEFORE A MEAL   ? pioglitazone (ACTOS) 15 MG tablet Take 1 tablet (15 mg total) by mouth daily. (Patient taking differently: Take 15 mg by mouth daily. Monday, wednesday, Friday)   ? telmisartan-hydrochlorothiazide (MICARDIS HCT) 40-12.5 MG tablet TAKE 1 TABLET BY MOUTH DAILY   ? Turmeric 500 MG CAPS Take 1 capsule by mouth 2 (two) times daily.   ? vitamin C (ASCORBIC ACID) 500 MG tablet Take 1 tablet (500 mg total) by mouth daily.   ? XULTOPHY 100-3.6 UNIT-MG/ML SOPN INJECT 25 UNITS SUBCUTANEOUSLY DAILY   ? [DISCONTINUED] Pitavastatin Calcium (LIVALO) 4 MG TABS Take 1 tablet (4 mg total) by mouth daily.   ? ?No facility-administered encounter medications on file as of 08/11/2021.  ?  ? ?Objective:  ? ?-Contacted patient to inform her of agreed medication change. Patient reports that she understands and  will start taking the new medication once she is done using the [place] she has left. Patient has received Atorvastatin 10 mg tablet tablet prescription.  ? ?Plan:  ? ?The patient has been provided with contact information for the care management team and has been advised to call with any health related questions or concerns.  ? ? ?Orlando Penner, CPP, PharmD ?Clinical Pharmacist Practitioner ?[facility] ?[phone] ? ? ? ? ? ?

## 2021-08-13 ENCOUNTER — Telehealth: Payer: Self-pay

## 2021-08-13 NOTE — Telephone Encounter (Signed)
er

## 2021-08-14 ENCOUNTER — Telehealth: Payer: Self-pay

## 2021-08-14 NOTE — Chronic Care Management (AMB) (Addendum)
    Called April Larson, No answer, left message of appointment on 08-18-2021 at 10:00 via telephone visit with Orlando Penner, Pharm D. Notified to have all medications, supplements, blood pressure and/or blood sugar logs available during appointment and to return call if need to reschedule.  08-17-2021: Left patient a voicemail to reschedule 08-18-2021 appointment due to CPP being out. Appointment canceled.   [place] Pharmacist Assistant [phone]

## 2021-08-18 ENCOUNTER — Telehealth: Payer: Medicare HMO

## 2021-08-24 ENCOUNTER — Telehealth: Payer: Medicare HMO

## 2021-08-24 ENCOUNTER — Ambulatory Visit: Payer: Self-pay

## 2021-08-24 DIAGNOSIS — E1122 Type 2 diabetes mellitus with diabetic chronic kidney disease: Secondary | ICD-10-CM

## 2021-08-24 DIAGNOSIS — E7800 Pure hypercholesterolemia, unspecified: Secondary | ICD-10-CM

## 2021-08-24 DIAGNOSIS — I129 Hypertensive chronic kidney disease with stage 1 through stage 4 chronic kidney disease, or unspecified chronic kidney disease: Secondary | ICD-10-CM

## 2021-08-24 NOTE — Chronic Care Management (AMB) (Signed)
Chronic Care Management   CCM RN Visit Note  08/24/2021 Name: April Larson MRN: [id number] DOB: [birth year]  Subjective: April Larson is a 71 y.o. year old female who is a primary care patient of Glendale Chard, MD. The care management team was consulted for assistance with disease management and care coordination needs.    Engaged with patient by telephone for follow up visit in response to provider referral for case management and/or care coordination services.   Consent to Services:  The patient was given information about Chronic Care Management services, agreed to services, and gave verbal consent prior to initiation of services.  Please see initial visit note for detailed documentation.   Patient agreed to services and verbal consent obtained.   Assessment: Review of patient past medical history, allergies, medications, health status, including review of consultants reports, laboratory and other test data, was performed as part of comprehensive evaluation and provision of chronic care management services.   SDOH (Social Determinants of Health) assessments and interventions performed:    CCM Care Plan  No Known Allergies  Outpatient Encounter Medications as of 08/24/2021  Medication Sig Note   ACCU-CHEK FASTCLIX LANCETS MISC USE TO CHECK BLOOD SUGARS TWICE DAILY    ACCU-CHEK GUIDE test strip USE TO CHECK BLOOD SUGARS TWICE DAILY AS DIRECTED    aspirin EC 81 MG tablet Take 1 tablet (81 mg total) by mouth daily.    atorvastatin (LIPITOR) 10 MG tablet Take 1 tablet (10 mg total) by mouth daily.    B-D ULTRAFINE III SHORT PEN 31G X 8 MM MISC USE AS DIRECTED TWICE DAILY    Biotin 10 MG CAPS Take 1 tablet by mouth daily.    Blood Glucose Monitoring Suppl (ACCU-CHEK GUIDE ME) w/Device KIT USE TO CHECK BLOOD GLUCOSE TWICE DAILY    calcium carbonate (OSCAL) 1500 (600 Ca) MG TABS tablet Take 1 tablet (1,500 mg total) by mouth daily with breakfast.     carbidopa-levodopa (SINEMET IR) 25-100 MG tablet TAKE ONE TABLET BY MOUTH four TIMES DAILY. FOLLOW titration instructions provided separately in writing    Cholecalciferol 125 MCG (5000 UT) TABS Take 1 tablet (5,000 Units total) by mouth daily.    diclofenac Sodium (VOLTAREN) 1 % GEL APPLY 2 GRAMS TOPICALLY TO THE AFFECTED AREA FOUR TIMES DAILY    fluticasone (FLONASE) 50 MCG/ACT nasal spray shake liquid AND USE TWO SPRAYS IN EACH NOSTRIL DAILY    loratadine (CLARITIN) 10 MG tablet Take 1 tablet by mouth daily    metFORMIN (GLUCOPHAGE) 500 MG tablet Take 1 tablet (500 mg total) by mouth daily with breakfast. Dinner    Multiple Vitamins-Minerals (ONE-A-DAY WOMENS 50+ ADVANTAGE) TABS Take 1 tablet by mouth daily    NON FORMULARY 2 each. Calm 08/13/2020: Taking 2 at night before bed.    Omega-3 Fatty Acids (FISH OIL) 1000 MG CAPS Take 1 capsule by mouth daily (Patient not taking: Reported on 07/23/2021)    omeprazole (PRILOSEC) 40 MG capsule TAKE 1 CAPSULE BY MOUTH EVERY DAY BEFORE A MEAL    pioglitazone (ACTOS) 15 MG tablet Take 1 tablet (15 mg total) by mouth daily. (Patient taking differently: Take 15 mg by mouth daily. Monday, wednesday, Friday)    telmisartan-hydrochlorothiazide (MICARDIS HCT) 40-12.5 MG tablet TAKE 1 TABLET BY MOUTH DAILY    Turmeric 500 MG CAPS Take 1 capsule by mouth 2 (two) times daily.    vitamin C (ASCORBIC ACID) 500 MG tablet Take 1 tablet (500 mg total) by mouth  daily.    XULTOPHY 100-3.6 UNIT-MG/ML SOPN INJECT 25 UNITS SUBCUTANEOUSLY DAILY    No facility-administered encounter medications on file as of 08/24/2021.    Patient Active Problem List   Diagnosis Date Noted   Decreased dorsalis pedis pulse 11/17/2020   Tachycardia 08/11/2020   Cough 08/11/2020   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   Pure hypercholesterolemia 01/23/2020   Vitamin D deficiency 10/23/2019   Right hip pain 05/15/2018   Estrogen deficiency 05/15/2018   Class 1 obesity due to excess calories  with serious comorbidity and body mass index (BMI) of 31.0 to 31.9 in adult 05/15/2018   Diabetes mellitus with stage 2 chronic kidney disease ([place]) 01/11/2018   Benign hypertension with chronic kidney disease, stage II 01/11/2018    Conditions to be addressed/monitored: DM II with stage 2 CKD, Hypertensive Nephropathy, Pure Hypercholesterolemia   Care Plan : RN Care Manager Plan of Care  Updates made by Lynne Logan, RN since 08/24/2021 12:00 AM     Problem: No Plan of Care Established for management of chronic disease states (DM II with stage 2 CKD, Hypertensive Nephropathy, Pure Hypercholesterolemia)   Priority: High     Long-Range Goal: Establishment of Plan of Care for Management of Chronic disease states (DM II with stage 2 CKD, Hypertensive Nephropathy, Pure Hypercholesterolemia)   Start Date: 04/10/2021  Expected End Date: 04/09/2022  Recent Progress: On track  Priority: High  Note:   Current Barriers:  Knowledge Deficits related to plan of care for management of DM II with stage 2 CKD, Hypertensive Nephropathy, Pure Hypercholesterolemia   Chronic Disease Management support and education needs related to DM II with stage 2 CKD, Hypertensive Nephropathy, Pure Hypercholesterolemia    RNCM Clinical Goal(s):  Patient will verbalize basic understanding of  DM II with stage 2 CKD, Hypertensive Nephropathy, Pure Hypercholesterolemia  disease process and self health management plan as evidenced by patient will report having no disease exacerbations related to her chronic disease states  take all medications exactly as prescribed and will call provider for medication related questions as evidenced by patient will report having no missed doses of her prescribed medications demonstrate Improved health management independence as evidenced by patient will report 100% adherence to her prescribed treatment plan  continue to work with RN Care Manager to address care management and care  coordination needs related to  DM II with stage 2 CKD, Hypertensive Nephropathy, Pure Hypercholesterolemia  as evidenced by adherence to CM Team Scheduled appointments demonstrate ongoing self health care management ability   as evidenced by    through collaboration with RN Care manager, provider, and care team.   Interventions: 1:1 collaboration with primary care provider regarding development and update of comprehensive plan of care as evidenced by provider attestation and co-signature Inter-disciplinary care team collaboration (see longitudinal plan of care) Evaluation of current treatment plan related to  self management and patient's adherence to plan as established by provider  Diabetes Interventions:  (Status:  Condition stable.  Not addressed this visit.) Long Term Goal Assessed patient's understanding of A1c goal: <6.5% Provided education to patient about basic DM disease process Reviewed medications with patient and discussed importance of medication adherence Review of patient status, including review of consultants reports, relevant laboratory and other test results, and medications completed Reinforced the importance of adherence to following a diabetic diet , using the plate method and portion control  Mailed printed educational materials related to Chair Exercises Lab Results  Component Value Date   HGBA1C 6.5 (  H) 07/08/2021  Atypical Parkinsonism Interventions:  (Status:  Goal on track:  Yes.)  Long Term Goal Evaluation of current treatment plan related to  Atypical Parkinsonism , self-management and patient's adherence to plan as established by provider Determined patient will follow up with Dr. Hassell Done, neuro-ophthalmologist with [facility] on 08/25/21 '@7' :40 AM for evaluation of new symptoms regarding the loss of her peripheral vision  Reviewed additional follow up visits scheduled with PT/OT and ST on 523/23 '@1' :15 PM, 1:30 PM, 1:45 PM  Discussed plans with  patient for ongoing care management follow up and provided patient with direct contact information for care management team  Hyperlipidemia Interventions:  (Status:  Condition stable.  Not addressed this visit.) Long Term Goal Evaluation of current treatment plan related to hyperlipidemia self management and patient's adherence to plan as established by provider Provider established cholesterol goals reviewed Counseled on importance of regular laboratory monitoring as prescribed Reviewed importance of limiting foods high in cholesterol Reviewed exercise goals and target of 150 minutes per week Discussed plans with patient for ongoing care management follow up and provided patient with direct contact information for care management team Lipid Panel     Component Value Date/Time   CHOL 176 08/11/2020 1450   TRIG 390 (H) 08/11/2020 1450   HDL 53 08/11/2020 1450   CHOLHDL 3.3 10/23/2019 1531   CHOLHDL 2.2 01/17/2009 0545   VLDL 15 01/17/2009 0545   LDLCALC 63 08/11/2020 1450   LABVLDL 60 (H) 08/11/2020 1450   Hypertension Interventions:  (Status:  Condition stable.  Not addressed this visit.) Long Term Goal Last practice recorded BP readings:  BP Readings from Last 3 Encounters:  07/23/21 122/62  07/08/21 120/68  04/09/21 (!) 145/79  Most recent eGFR/CrCl:  Lab Results  Component Value Date   EGFR 68 07/08/2021    No components found for: CRCL Evaluation of current treatment plan related to hypertension self management and patient's adherence to plan as established by provider Reviewed medications with patient and discussed importance of compliance Educated patient on the importance to take his medications exactly as prescribed without missed doses Instructed patient to call her provider for signs and symptoms of high blood pressure and or to report new symptoms or concerns Provided education on prescribed diet low Sodium  Patient Goals/Self-Care Activities: Take all medications as  prescribed Attend all scheduled provider appointments Call pharmacy for medication refills 3-7 days in advance of running out of medications Perform all self care activities independently  Perform IADL's (shopping, preparing meals, housekeeping, managing finances) independently Call provider office for new concerns or questions  drink 6 to 8 glasses of water each day manage portion size check blood pressure 3 times per week choose a place to take my blood pressure (home, clinic or office, retail store) call doctor for signs and symptoms of high blood pressure take medications for blood pressure exactly as prescribed report new symptoms to your doctor  Follow Up Plan:  Telephone follow up appointment with care management team member scheduled for:  09/07/21     Barb Merino, RN, BSN, CCM Care Management Coordinator [place] [facility]  Direct Phone: [phone]

## 2021-08-24 NOTE — Patient Instructions (Signed)
Visit Information  Thank you for taking time to visit with me today. Please don't hesitate to contact me if I can be of assistance to you before our next scheduled telephone appointment.  Following are the goals we discussed today:  (Copy and paste patient goals from clinical care plan here)  Our next appointment is by telephone on 09/07/21 at 9:30 AM   Please call the care guide team at [phone] if you need to cancel or reschedule your appointment.   If you are experiencing a Mental Health or [employer] or need someone to talk to, please call 1-800-273-TALK (toll free, 24 hour hotline)   Patient verbalizes understanding of instructions and care plan provided today and agrees to view in [place]. Active MyChart status and patient understanding of how to access instructions and care plan via MyChart confirmed with patient.     Barb Merino, RN, BSN, CCM Care Management Coordinator [place] [facility]  Direct Phone: [phone]

## 2021-08-25 ENCOUNTER — Ambulatory Visit: Payer: Medicare HMO | Attending: Internal Medicine | Admitting: Occupational Therapy

## 2021-08-25 ENCOUNTER — Ambulatory Visit: Payer: Medicare HMO

## 2021-08-25 ENCOUNTER — Ambulatory Visit: Payer: Medicare HMO | Admitting: Physical Therapy

## 2021-08-25 ENCOUNTER — Telehealth: Payer: Self-pay | Admitting: Physical Therapy

## 2021-08-25 DIAGNOSIS — R293 Abnormal posture: Secondary | ICD-10-CM

## 2021-08-25 DIAGNOSIS — R41841 Cognitive communication deficit: Secondary | ICD-10-CM

## 2021-08-25 DIAGNOSIS — G20 Parkinson's disease: Secondary | ICD-10-CM | POA: Diagnosis not present

## 2021-08-25 DIAGNOSIS — R2681 Unsteadiness on feet: Secondary | ICD-10-CM

## 2021-08-25 DIAGNOSIS — H5340 Unspecified visual field defects: Secondary | ICD-10-CM | POA: Diagnosis not present

## 2021-08-25 DIAGNOSIS — Z79899 Other long term (current) drug therapy: Secondary | ICD-10-CM | POA: Diagnosis not present

## 2021-08-25 DIAGNOSIS — R1310 Dysphagia, unspecified: Secondary | ICD-10-CM

## 2021-08-25 DIAGNOSIS — R471 Dysarthria and anarthria: Secondary | ICD-10-CM

## 2021-08-25 DIAGNOSIS — G231 Progressive supranuclear ophthalmoplegia [Steele-Richardson-Olszewski]: Secondary | ICD-10-CM | POA: Diagnosis not present

## 2021-08-25 DIAGNOSIS — R278 Other lack of coordination: Secondary | ICD-10-CM

## 2021-08-25 NOTE — Therapy (Signed)
Occupational Therapy Parkinson's Disease Screen  Hand dominance:  RUE   Physical Performance Test item #2 (simulated eating):  18.75 sec  Physical Performance Test item #4 (donning/doffing jacket):  *** sec  Fastening/unfastening 3 buttons in:  ***sec  9-hole peg test:    RUE  38.94 sec        LUE  37.44 sec  Box & Blocks Test:   RUE  *** blocks        LUE  *** blocks  Standing Functional Reach Test:   RUE  *** inches        LUE  *** inches  Change in ability to perform ADLs/IADLs:  ***  Other Comments:  ***  Pt would benefit from occupational therapy evaluation due to  decline in coordination and balance and visual changes  Pt does not require occupational therapy services at this time.  Recommended occupational therapy screen in   ***

## 2021-08-25 NOTE — Telephone Encounter (Signed)
Neuro rehab referral entered.

## 2021-08-25 NOTE — Telephone Encounter (Signed)
Dr. Rexene Alberts,   April Larson is scheduled for  OT, PT, and ST evaluations on 10/08/21 due to slowing of mobility measures, cognitive linguistic changes, and impaired coordination/fine motor tasks due to progressive nature of Parkinsonism diagnosis.  Pt was in agreement with this plan for evals.    If you are in agreement, please send updated order for OT, PT, and ST via epic.  Thank you! Janann August, PT, DPT 08/25/21 3:24 PM    [facility] [address] [place], [place]  [address] Phone:  [phone] Fax:  [phone]

## 2021-08-25 NOTE — Therapy (Signed)
[facility] [address] [place], [place], [address] Phone: [phone]   Fax:  [phone]  Patient Details  Name: April Larson MRN: [id number] Date of Birth: 02/14/1951 Referring Provider:  Glendale Chard, MD  Encounter Date: 08/25/2021  Physical Therapy Parkinson's Disease Screen   Timed Up and Go test: 14.15 seconds (previously 12.38)  10 meter walk test:12.25 seconds = 2.67 ft/sec (previously 3.01 ft/sec)  5 time sit to stand test: 19.15 seconds (previously 19.35), pt with decr eccentric control and retropulsion  Reports feeling more off balance. No falls, but some almost falls. Not doing anything for exercise. Will start going to the [facility] in June.   Patient would benefit from Physical Therapy evaluation due to pt with more off balance and having a slowing of mobility measures since she was last here.   Arliss Journey, PT, DPT  08/25/2021, 1:18 PM  [facility] [address] [place], [place], [address] Phone: [phone]   Fax:  [phone]

## 2021-08-25 NOTE — Addendum Note (Signed)
Addended by: [name] on: 08/25/2021 03:40 PM   Modules accepted: Orders

## 2021-08-25 NOTE — Therapy (Signed)
[facility] [address] [place], [place], [address] Phone: [phone]   Fax:  [phone]  Patient Details  Name: April Larson MRN: [id number] Date of Birth: 01-10-1951 Referring Provider:  [name], MD  Encounter Date: 08/25/2021  Speech Therapy Parkinson's Disease Screen   Decibel Level today: upper 60s dB  (WNL=70-72 dB) with sound level meter 30cm away from pt's masked mouth. Pt's conversational volume has remained same since last treatment course. Sister indicated sometimes April Larson is too loud. Limited carryover of HEP indicated related to motivation.   Pt c/o "jumbled words" with pt inserting wrong words, which is frustrating and leads to insecurity and avoidance of talking. Pt is experiencing slower processing and changes in attention, resulting in pt getting lost in conversation and trouble organizing thoughts.   Pt does report difficulty with swallowing warranting further evaluation. With prompting from sister, pt identified coughing with every meal with both solids and liquids, which is change since last course of ST.   Pt would benefit from speech-language eval for cognitive linguistic changes and bedside swallow eval (possibly modified barium swallow eval) - please order via EPIC or call [phone] to schedule  Marzetta Board, CCC-SLP 08/25/2021, 1:16 PM  [facility] [address] [place], [place], [address] Phone: [phone]   Fax:  [phone]

## 2021-09-02 DIAGNOSIS — Z794 Long term (current) use of insulin: Secondary | ICD-10-CM

## 2021-09-02 DIAGNOSIS — Z7984 Long term (current) use of oral hypoglycemic drugs: Secondary | ICD-10-CM | POA: Diagnosis not present

## 2021-09-02 DIAGNOSIS — E1159 Type 2 diabetes mellitus with other circulatory complications: Secondary | ICD-10-CM

## 2021-09-02 DIAGNOSIS — I10 Essential (primary) hypertension: Secondary | ICD-10-CM | POA: Diagnosis not present

## 2021-09-02 DIAGNOSIS — Z87891 Personal history of nicotine dependence: Secondary | ICD-10-CM | POA: Diagnosis not present

## 2021-09-02 DIAGNOSIS — E785 Hyperlipidemia, unspecified: Secondary | ICD-10-CM

## 2021-09-02 DIAGNOSIS — G308 Other Alzheimer's disease: Secondary | ICD-10-CM

## 2021-09-07 ENCOUNTER — Telehealth: Payer: Medicare HMO

## 2021-09-08 ENCOUNTER — Telehealth: Payer: Self-pay

## 2021-09-08 NOTE — Chronic Care Management (AMB) (Signed)
[employer] patient assistance program notification:  Patient is enrolled in auto-refill, 120- day supply of Xultophy 100/3.6 mg will be filled on 09/25/2021 and should arrive to the office in 10-14 business days. Patient enrollment will expire on 03/04/2022.  [name], [place] Pharmacist Assistant [phone]

## 2021-09-08 NOTE — Telephone Encounter (Signed)
Confirmed interest in starting PREP on 09/29/21 at [facility] 6p-715p T/TH  Intake scheduled for 09/24/21 at 5p

## 2021-09-22 NOTE — Progress Notes (Signed)
[facility] PREP Evaluation  Patient Details  Name: April Larson MRN: [id number] Date of Birth: [birth year] Age: 71 y.o. PCP: Glendale Chard, MD  Vitals:   09/22/21 1752  BP: 132/70  Pulse: 91  SpO2: 96%  Weight: 176 lb 12.8 oz (80.2 kg)     [facility] Eval - 09/22/21 1700       [facility] "PREP" Location   [facility] "PREP" Location [facility]      Referral    Referring Provider [place]    Reason for referral Inactivity;Hypertension    Program Start Date 09/29/21   T/TH 6p-715p x 12 wks     Measurement   Waist Circumference 41 inches    Hip Circumference 43 inches    Body fat 42.7 percent      Information for Trainer   Goals Get more exercise, lose 10-15 lbs    Current Exercise none    Orthopedic Concerns Knees and hip Pain, neck and back stiffness    Pertinent Medical History HTN, DM, high chol, CKD, Parkinsons/    Current Barriers none    Restrictions/Precautions Fall risk;Assistive device;Diabetic snack before exercise   Asked to bring cane with her to class   Medications that affect exercise Medication causing dizziness/drowsiness      Timed Up and Go (TUGS)   Timed Up and Go Low risk <9 seconds   Did have fall recently     Mobility and Daily Activities   I find it easy to walk up or down two or more flights of stairs. 1    I have no trouble taking out the trash. 2    I do housework such as vacuuming and dusting on my own without difficulty. 2    I can easily lift a gallon of milk (8lbs). 4    I can easily walk a mile. 1    I have no trouble reaching into high cupboards or reaching down to pick up something from the floor. 2    I do not have trouble doing out-door work such as Armed forces logistics/support/administrative officer, raking leaves, or gardening. 1      Mobility and Daily Activities   I feel younger than my age. 2    I feel independent. 2    I feel energetic. 1    I live an active life.  1    I feel strong. 1    I feel healthy. 2    I feel active as other people my age. 2       How fit and strong are you.   Fit and Strong Total Score 24            Past Medical History:  Diagnosis Date   Diabetes mellitus without complication ([place])    Hypertension    Substance abuse ([place])    Past Surgical History:  Procedure Laterality Date   EYE SURGERY     Social History   Tobacco Use  Smoking Status Former   Packs/day: 1.00   Years: 34.00   Total pack years: 34.00   Types: Cigarettes   Start date: 51   Quit date: 04/08/2005   Years since quitting: 16.4  Smokeless Tobacco Never    Barnett Hatter 09/22/2021, 5:56 PM

## 2021-09-30 ENCOUNTER — Telehealth: Payer: Self-pay

## 2021-09-30 NOTE — Chronic Care Management (AMB) (Cosign Needed Addendum)
Chronic Care Management Pharmacy Assistant   Name: April Larson  MRN: [id number] DOB: 07-10-1950  Reason for Encounter: Medication Review/ Medication coordination  Recent office visits:  08-24-2021 Riley Churches, RN (CCM)  08-04-2021 Riley Churches, RN (CCM)  07-23-2021 Barb Merino, LPN. Medicare annual wellness visit.  Recent consult visits:  08-25-2021 Gracy Racer, CCC-SLP (Speech pathology). Visit for speech therapy parkinson's disease screening.  08-25-2021 Colonel Bald, OT (Occupational therapy). Range of motion on hands.  08-25-2021 Drake Leach, PT (Physical therapy). Physical therapy for unsteadiness on feet.  08-25-2021 Gentry Roch, MD (Ophthalmology). Initial visit for supranuclear palsy.   Hospital visits:  None in previous 6 months  Medications: Outpatient Encounter Medications as of 09/30/2021  Medication Sig Note   ACCU-CHEK FASTCLIX LANCETS MISC USE TO CHECK BLOOD SUGARS TWICE DAILY    ACCU-CHEK GUIDE test strip USE TO CHECK BLOOD SUGARS TWICE DAILY AS DIRECTED    aspirin EC 81 MG tablet Take 1 tablet (81 mg total) by mouth daily.    atorvastatin (LIPITOR) 10 MG tablet Take 1 tablet (10 mg total) by mouth daily.    B-D ULTRAFINE III SHORT PEN 31G X 8 MM MISC USE AS DIRECTED TWICE DAILY    Biotin 10 MG CAPS Take 1 tablet by mouth daily.    Blood Glucose Monitoring Suppl (ACCU-CHEK GUIDE ME) w/Device KIT USE TO CHECK BLOOD GLUCOSE TWICE DAILY    calcium carbonate (OSCAL) 1500 (600 Ca) MG TABS tablet Take 1 tablet (1,500 mg total) by mouth daily with breakfast.    carbidopa-levodopa (SINEMET IR) 25-100 MG tablet TAKE ONE TABLET BY MOUTH four TIMES DAILY. FOLLOW titration instructions provided separately in writing    Cholecalciferol 125 MCG (5000 UT) TABS Take 1 tablet (5,000 Units total) by mouth daily.    diclofenac Sodium (VOLTAREN) 1 % GEL APPLY 2 GRAMS TOPICALLY TO THE AFFECTED AREA FOUR TIMES DAILY     fluticasone (FLONASE) 50 MCG/ACT nasal spray shake liquid AND USE TWO SPRAYS IN EACH NOSTRIL DAILY    loratadine (CLARITIN) 10 MG tablet Take 1 tablet by mouth daily    metFORMIN (GLUCOPHAGE) 500 MG tablet Take 1 tablet (500 mg total) by mouth daily with breakfast. Dinner    Multiple Vitamins-Minerals (ONE-A-DAY WOMENS 50+ ADVANTAGE) TABS Take 1 tablet by mouth daily    NON FORMULARY 2 each. Calm 08/13/2020: Taking 2 at night before bed.    Omega-3 Fatty Acids (FISH OIL) 1000 MG CAPS Take 1 capsule by mouth daily (Patient not taking: Reported on 07/23/2021)    omeprazole (PRILOSEC) 40 MG capsule TAKE 1 CAPSULE BY MOUTH EVERY DAY BEFORE A MEAL    pioglitazone (ACTOS) 15 MG tablet Take 1 tablet (15 mg total) by mouth daily. (Patient taking differently: Take 15 mg by mouth daily. Monday, wednesday, Friday)    telmisartan-hydrochlorothiazide (MICARDIS HCT) 40-12.5 MG tablet TAKE 1 TABLET BY MOUTH DAILY    Turmeric 500 MG CAPS Take 1 capsule by mouth 2 (two) times daily.    vitamin C (ASCORBIC ACID) 500 MG tablet Take 1 tablet (500 mg total) by mouth daily.    XULTOPHY 100-3.6 UNIT-MG/ML SOPN INJECT 25 UNITS SUBCUTANEOUSLY DAILY    No facility-administered encounter medications on file as of 09/30/2021.  Reviewed chart for medication changes ahead of medication coordination call.  No  hospital visits since last care coordination call/Pharmacist visit.   BP Readings from Last 3 Encounters:  09/22/21 132/70  07/23/21 122/62  07/08/21 120/68  Lab Results  Component Value Date   HGBA1C 6.5 (H) 07/08/2021     Patient obtains medications through Adherence Packaging  90 Days   Last adherence delivery included:  Telmisartan-HCTZ 40-12.5 mg daily at breakfast Carbidopa-levodopa 25-100 mg before breakfast, at lunch, with evening meal and bedtime Omeprazole 40 mg daily before breakfast Vitamin C 1000 mg daily at breakfast Calcium carbonate 600 mg daily at breakfast Loratadine 10 mg daily at  breakfast One-a Day Womens Multivitamin daily at breakfast Asa 81 mg daily at breakfast Vitamin D3 5, 000 units at bedtime Metformin 500 mg at bedtime Pioglitazone 15 mg at bedtime Flonase 2 sprays each nostril daily   Patient declined (meds) last month: Unable to reach patient  Patient is due for next adherence delivery on: 10-09-2021  Called patient and reviewed medications and coordinated delivery.  This delivery to include: Telmisartan-HCTZ 40-12.5 mg daily at breakfast Carbidopa-levodopa 25-100 mg before breakfast, at lunch, with evening meal and bedtime Omeprazole 40 mg daily before breakfast Vitamin C 1000 mg daily at breakfast Calcium carbonate 600 mg daily at breakfast Loratadine 10 mg daily at breakfast One-a Day Womens Multivitamin daily at breakfast Asa 81 mg daily at breakfast Vitamin D3 5, 000 units at bedtime Metformin 500 mg at bedtime Pioglitazone 15 mg at bedtime (Was informed patient is tapering off of med so new script for MWF was sent for 45 days and then she is to stop medication) Flonase 2 sprays each nostril daily  Atorvastatin 10 mg at bedtime  No short/acute fill needed  Patient declined the following medications: None  Patient needs refills for: sent Telmisartan-HCTZ  Actos Atorvastatin  Confirmed delivery date of 10-09-2021 advised patient that pharmacy will contact them the morning of delivery.  [facility] Pharmacist Assistant [phone]

## 2021-10-01 ENCOUNTER — Other Ambulatory Visit: Payer: Self-pay

## 2021-10-01 MED ORDER — TELMISARTAN-HCTZ 40-12.5 MG PO TABS: 1.0000 | ORAL_TABLET | Freq: Every day | ORAL | 1 refills | Status: DC

## 2021-10-01 MED ORDER — ATORVASTATIN CALCIUM 10 MG PO TABS: 10.0000 mg | ORAL_TABLET | Freq: Every day | ORAL | 0 refills | Status: DC

## 2021-10-01 MED ORDER — PIOGLITAZONE HCL 15 MG PO TABS: 15.0000 mg | ORAL_TABLET | Freq: Every day | ORAL | 1 refills | Status: DC

## 2021-10-02 ENCOUNTER — Other Ambulatory Visit: Payer: Self-pay

## 2021-10-02 ENCOUNTER — Other Ambulatory Visit: Payer: Self-pay | Admitting: Internal Medicine

## 2021-10-02 DIAGNOSIS — R490 Dysphonia: Secondary | ICD-10-CM

## 2021-10-02 MED ORDER — BD PEN NEEDLE SHORT U/F 31G X 8 MM MISC: 11 refills | Status: DC

## 2021-10-05 NOTE — Therapy (Incomplete)
OUTPATIENT OCCUPATIONAL THERAPY PARKINSON'S EVALUATION  Patient Name: April Larson MRN: [id number] DOB:02-02-1951, 71 y.o., female Today's Date: 10/05/2021  PCP: Glendale Chard, MD REFERRING PROVIDER: [name], MD     Past Medical History:  Diagnosis Date   Diabetes mellitus without complication ([place])    Hypertension    Substance abuse [facility])    Past Surgical History:  Procedure Laterality Date   EYE SURGERY     Patient Active Problem List   Diagnosis Date Noted   Decreased dorsalis pedis pulse 11/17/2020   Tachycardia 08/11/2020   Cough 08/11/2020   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   Pure hypercholesterolemia 01/23/2020   Vitamin D deficiency 10/23/2019   Right hip pain 05/15/2018   Estrogen deficiency 05/15/2018   Class 1 obesity due to excess calories with serious comorbidity and body mass index (BMI) of 31.0 to 31.9 in adult 05/15/2018   Diabetes mellitus with stage 2 chronic kidney disease ([place]) 01/11/2018   Benign hypertension with chronic kidney disease, stage II 01/11/2018    ONSET DATE: 08/25/2021 PD screen   REFERRING DIAG: R27.8ICD-10-CM Other lack of coordination;  [id number] Unsteadiness on feet R29.3; ICD-10-CM Abnormal posture;  G20ICD-10-CM Primary parkinsonism ([place])   THERAPY DIAG:  No diagnosis found.  Rationale for Evaluation and Treatment Rehabilitation  SUBJECTIVE:   SUBJECTIVE STATEMENT: *** Pt accompanied by: {accompnied:27141}  PERTINENT HISTORY: ***  PRECAUTIONS: {Therapy precautions:24002}  WEIGHT BEARING RESTRICTIONS {Yes ***/No:24003}  PAIN:  Are you having pain? {OPRCPAIN:27236}  FALLS: Has patient fallen in last 6 months? {fallsyesno:27318}  LIVING ENVIRONMENT: Lives with: {OPRC lives with:25569::"lives with their family"} Lives in: {Lives in:25570} Stairs: {opstairs:27293} Has following equipment at home: {Assistive devices:23999}  PLOF: {PLOF:24004}  PATIENT GOALS ***  OBJECTIVE:   HAND  DOMINANCE: {MISC; OT HAND DOMINANCE:[phone]}  ADLs: Overall ADLs: *** Transfers/ambulation related to ADLs: Eating: *** Grooming: *** UB Dressing: *** LB Dressing: *** Toileting: *** Bathing: *** Tub Shower transfers: *** Equipment: {equipment:25573}   IADLs: Shopping: *** Light housekeeping: *** Meal Prep: *** Community mobility: *** Medication management: *** Financial management: *** Handwriting: {OTWRITTENEXPRESSION:25361}  MOBILITY STATUS: {OTMOBILITY:25360}  POSTURE COMMENTS:  {posture:25561}  ACTIVITY TOLERANCE: Activity tolerance: ***  FUNCTIONAL OUTCOME MEASURES: {PDoutcomemeasures:27287}  COORDINATION: {otcoordination:27237}  UE ROM:  {PDMEASUREMENT:27288}  UE MMT:   {PDMEASUREMENT:27288}  SENSATION: {sensation:27233}    MUSCLE TONE: {UETONE:25567}  COGNITION: Overall cognitive status: {cognition:24006}   OBSERVATIONS: {PDobservations:27291}   TODAY'S TREATMENT:  ***   PATIENT EDUCATION: Education details: *** Person educated: {Person educated:25204} Education method: {Education Method:25205} Education comprehension: {Education Comprehension:25206}   HOME EXERCISE PROGRAM: ***  ASSESSMENT:  CLINICAL IMPRESSION: Patient is a *** y.o. *** who was seen today for occupational therapy evaluation for ***.   PERFORMANCE DEFICITS in functional skills including {OT physical skills:25468}, cognitive skills including {OT cognitive skills:25469}, and psychosocial skills including {OT psychosocial skills:25470}.   IMPAIRMENTS are limiting patient from {OT performance deficits:25471}.   COMORBIDITIES {Comorbidities:25485} that affects occupational performance. Patient will benefit from skilled OT to address above impairments and improve overall function.  MODIFICATION OR ASSISTANCE TO COMPLETE EVALUATION: {OT modification:25474}  OT OCCUPATIONAL PROFILE AND HISTORY: {OT PROFILE AND HISTORY:25484}  CLINICAL DECISION MAKING: {OT  CDM:25475}  REHAB POTENTIAL: {rehabpotential:25112}  EVALUATION COMPLEXITY: {Evaluation complexity:25115}     GOALS: Goals reviewed with patient? {yes/no:20286}  SHORT TERM GOALS: Target date: {follow up:25551}  (Remove Blue Hyperlink)  *** Baseline: Goal status: {GOALSTATUS:25110}  2.  *** Baseline:  Goal status: {GOALSTATUS:25110}  3.  *** Baseline:  Goal status: {GOALSTATUS:25110}  4.  ***  Baseline:  Goal status: {GOALSTATUS:25110}  5.  *** Baseline:  Goal status: {GOALSTATUS:25110}  6.  *** Baseline:  Goal status: {GOALSTATUS:25110}  LONG TERM GOALS: Target date: {follow up:25551}  (Remove Blue Hyperlink)  *** Baseline:  Goal status: {GOALSTATUS:25110}  2.  *** Baseline:  Goal status: {GOALSTATUS:25110}  3.  *** Baseline:  Goal status: {GOALSTATUS:25110}  4.  *** Baseline:  Goal status: {GOALSTATUS:25110}  5.  *** Baseline:  Goal status: {GOALSTATUS:25110}  6.  *** Baseline:  Goal status: {GOALSTATUS:25110}  PLAN: OT FREQUENCY: {rehab frequency:25116}  OT DURATION: {rehab duration:25117}  PLANNED INTERVENTIONS: {OT Interventions:25467}  RECOMMENDED OTHER SERVICES: ***  CONSULTED AND AGREED WITH PLAN OF CARE: {SID:39584}  PLAN FOR NEXT SESSION: ***   Vianne Bulls, OTR/L 10/05/2021, 8:39 AM

## 2021-10-07 ENCOUNTER — Telehealth: Payer: Self-pay

## 2021-10-07 NOTE — Chronic Care Management (AMB) (Signed)
[employer] patient assistance program notification:  120- day supply of Xultophy 100/3.6 mg/ml was filled on 09/30/2021 and should arrive to the office in 10-14 business days. Patient has 1  refill remaining and enrollment will expire on 03/04/2022.  The next refill for patient will be fulfilled on 12/17/2021.  [name], [place] Pharmacist Assistant [phone]

## 2021-10-08 ENCOUNTER — Ambulatory Visit: Payer: Medicare HMO | Admitting: Neurology

## 2021-10-08 ENCOUNTER — Encounter: Payer: Self-pay | Admitting: Neurology

## 2021-10-08 ENCOUNTER — Ambulatory Visit: Payer: Medicare HMO

## 2021-10-08 ENCOUNTER — Ambulatory Visit: Payer: Medicare HMO | Admitting: Occupational Therapy

## 2021-10-08 ENCOUNTER — Ambulatory Visit: Payer: Medicare HMO | Attending: Internal Medicine | Admitting: Physical Therapy

## 2021-10-08 VITALS — BP 156/90 | HR 67 | Ht 63.0 in | Wt 166.8 lb

## 2021-10-08 DIAGNOSIS — R2689 Other abnormalities of gait and mobility: Secondary | ICD-10-CM

## 2021-10-08 DIAGNOSIS — R2681 Unsteadiness on feet: Secondary | ICD-10-CM

## 2021-10-08 DIAGNOSIS — R293 Abnormal posture: Secondary | ICD-10-CM

## 2021-10-08 DIAGNOSIS — R278 Other lack of coordination: Secondary | ICD-10-CM

## 2021-10-08 DIAGNOSIS — R41841 Cognitive communication deficit: Secondary | ICD-10-CM | POA: Insufficient documentation

## 2021-10-08 DIAGNOSIS — R471 Dysarthria and anarthria: Secondary | ICD-10-CM

## 2021-10-08 DIAGNOSIS — R41842 Visuospatial deficit: Secondary | ICD-10-CM

## 2021-10-08 DIAGNOSIS — G20 Parkinson's disease: Secondary | ICD-10-CM

## 2021-10-08 DIAGNOSIS — Z9181 History of falling: Secondary | ICD-10-CM | POA: Diagnosis not present

## 2021-10-08 DIAGNOSIS — R29818 Other symptoms and signs involving the nervous system: Secondary | ICD-10-CM | POA: Diagnosis not present

## 2021-10-08 DIAGNOSIS — R1310 Dysphagia, unspecified: Secondary | ICD-10-CM | POA: Insufficient documentation

## 2021-10-08 DIAGNOSIS — M25621 Stiffness of right elbow, not elsewhere classified: Secondary | ICD-10-CM

## 2021-10-08 DIAGNOSIS — M6281 Muscle weakness (generalized): Secondary | ICD-10-CM | POA: Insufficient documentation

## 2021-10-08 DIAGNOSIS — R41840 Attention and concentration deficit: Secondary | ICD-10-CM | POA: Insufficient documentation

## 2021-10-08 NOTE — Therapy (Signed)
OUTPATIENT PHYSICAL THERAPY NEURO EVALUATION   Patient Name: April Larson MRN: [id number] DOB:11-Apr-1950, 71 y.o., female Today's Date: 10/08/2021   PCP: Glendale Chard, MD REFERRING PROVIDER: [name], MD    PT End of Session - 10/08/21 [phone]     Visit Number 1    Number of Visits 17   Plus eval   Date for PT Re-Evaluation 12/10/21    Authorization Type [employer] Medicare    Progress Note Due on Visit 10    PT Start Time 0813   Pt arrived late   PT Stop Time 0845    PT Time Calculation (min) 32 min    Activity Tolerance Patient tolerated treatment well    Behavior During Therapy [facility] for tasks assessed/performed             Past Medical History:  Diagnosis Date   Diabetes mellitus without complication ([place])    Hypertension    Substance abuse ([place])    Past Surgical History:  Procedure Laterality Date   EYE SURGERY     Patient Active Problem List   Diagnosis Date Noted   Decreased dorsalis pedis pulse 11/17/2020   Tachycardia 08/11/2020   Cough 08/11/2020   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   Pure hypercholesterolemia 01/23/2020   Vitamin D deficiency 10/23/2019   Right hip pain 05/15/2018   Estrogen deficiency 05/15/2018   Class 1 obesity due to excess calories with serious comorbidity and body mass index (BMI) of 31.0 to 31.9 in adult 05/15/2018   Diabetes mellitus with stage 2 chronic kidney disease ([place]) 01/11/2018   Benign hypertension with chronic kidney disease, stage II 01/11/2018    ONSET DATE: 08/25/2021 (referral)  REFERRING DIAG: R47.1 (ICD-10-CM) - Dysarthria and anarthria R41.841 (ICD-10-CM) - Cognitive communication deficit R13.10 (ICD-10-CM) - Dysphagia, unspecified type R27.8 (ICD-10-CM) - Other lack of coordination R26.81 (ICD-10-CM) - Unsteadiness on feet R29.3 (ICD-10-CM) - Abnormal posture G20 (ICD-10-CM) - Primary parkinsonism ([place])   THERAPY DIAG:  Unsteadiness on feet  Other lack of coordination  Other  abnormalities of gait and mobility  History of falling  Rationale for Evaluation and Treatment Rehabilitation  SUBJECTIVE:                                                                                                                                                                                              SUBJECTIVE STATEMENT: Pt reports single fall since PD screen in May. Pt states she thought she had put her keys in the trash can and was leaning over trash can to look for keys and fell, busted the bridge of her nose but otherwise  no injuries. Pt reports she did join the [facility] and goes on Tuesdays/Thursdays with a trainer, mostly riding the stationary bike. Pt states she is sedentary at home, does not walk much other than from room to room. Is not doing HEP from previous bout of therapy, unsure where exercises are. Pt states she went to optometrist and her peripheral vision is fine, "they said I cannot see when I look down due to a spot on my brain". Pt reports she is unable to focus w/downward gaze.    Pt accompanied by: self  PERTINENT HISTORY: HTN, DM, high chol, CKD, Supranuclear Palsy   PAIN:  Are you having pain? No  PRECAUTIONS: Fall  WEIGHT BEARING RESTRICTIONS No  FALLS: Has patient fallen in last 6 months? Yes. Number of falls 1 (3 weeks ago)  LIVING ENVIRONMENT: Lives with: lives alone Lives in: House/apartment Stairs: Yes: External: 4 steps; can reach both Has following equipment at home: Single point cane and shower chair  PLOF: Independent with basic ADLs but reports difficulty w/cleaning   PATIENT GOALS "I wanna work on walking"   OBJECTIVE:   COGNITION: Overall cognitive status: No family/caregiver present to determine baseline cognitive functioning   SENSATION: Not tested- pt denies   COORDINATION: Heel to shin test: WFL but hypokinesia noted bilaterally   POSTURE: rounded shoulders, forward head, increased thoracic kyphosis, and posterior pelvic  tilt   LOWER EXTREMITY MMT:  Tested in seated position  MMT Right Eval Left Eval  Hip flexion 4+ 4+  Hip extension    Hip abduction 5 5  Hip adduction 5 5  Hip internal rotation    Hip external rotation    Knee flexion 4+ 4+  Knee extension 4+ 4+  Ankle dorsiflexion 5 5  Ankle plantarflexion    Ankle inversion    Ankle eversion    (Blank rows = not tested)  BED MOBILITY:  Pt reports independence with bed mobility  TRANSFERS: Assistive device utilized: None  Sit to stand: SBA pt demonstrates poor body mechanics w/stand and heavy bracing against chair to correct retropulsion  Stand to sit: SBA poor eccentric control   GAIT: Gait pattern: step through pattern, decreased arm swing- Right, and decreased stride length Distance walked: Various clinic distances  Assistive device utilized: None Level of assistance: SBA Comments: No instability noted   FUNCTIONAL TESTs:   [facility] PT Assessment - 10/08/21 0827       Transfers   Five time sit to stand comments  16.15s wi/hands braced on upper thighs. Noted bracing against chair to correct retropulsion      Ambulation/Gait   Gait velocity 32.8' over 9.25s = 3.5 ft/s without AD      Balance   Balance Assessed Yes      Standardized Balance Assessment   Standardized Balance Assessment Timed Up and Go Test;Mini-BESTest      Mini-BESTest   Sit To Stand Normal: Comes to stand without use of hands and stabilizes independently.    Rise to Toes Moderate: Heels up, but not full range (smaller than when holding hands), OR noticeable instability for 3 s.    Stand on one leg (left) Moderate: < 20 s   2.78s   Stand on one leg (right) Moderate: < 20 s   2.91s   Stand on one leg - lowest score 1    Stance - Feet together, eyes open, firm surface  Normal: 30s    Stance - Feet together, eyes closed, foam surface  Moderate: <  53s   Pt did not maintain EC and braced against // bars, held for ~10s w/EC and without external support   Incline -  Eyes Closed Moderate: Stands independently < 30s OR aligns with surface   Pt opened eyes after 19s   Change in Gait Speed Normal: Significantly changes walkling speed without imbalance    Walk with head turns - Horizontal Moderate: performs head turns with reduction in gait speed.    Walk with pivot turns Moderate:Turns with feet close SLOW (>4 steps) with good balance.    Step over obstacles Moderate: Steps over box but touches box OR displays cautious behavior by slowing gait.    Timed UP & GO with Dual Task Moderate: Dual Task affects either counting OR walking (>10%) when compared to the TUG without Dual Task.      Timed Up and Go Test   Normal TUG (seconds) 9.85    Cognitive TUG (seconds) 13.25   Retro counting by 3s from 74            TODAY'S TREATMENT:  Next session    PATIENT EDUCATION: Education details: Eval findings, POC  Person educated: Patient Education method: Customer service manager Education comprehension: verbalized understanding and needs further education   HOME EXERCISE PROGRAM: To be reviewed and updated next session     GOALS: Goals reviewed with patient? Yes  SHORT TERM GOALS: Target date: 11/05/2021  Pt will be independent with initial HEP for improved strength, balance, transfers and gait.  Baseline: Has HEP from previous bout of therapy, needs to be reviewed  Goal status: INITIAL  2.  Pt will verbalize and demonstrate fall prevention strategies in the home and community for reduced fall risk and frequency Baseline:  Goal status: INITIAL  3.  Pt will improve cog TUG to less than or equal to 11 seconds for improved functional mobility and decreased fall risk.  Baseline: 13.25s  Goal status: INITIAL  4.  Pt will improve 5 x STS to less than or equal to 13 seconds without UE support and absent retropulsion to demonstrate improved functional strength and transfer efficiency.   Baseline: 16.15s w/retropulsion  Goal status:  INITIAL  LONG TERM GOALS: Target date: 12/03/2021  Pt will be independent with final HEP for improved strength, balance, transfers and gait.  Baseline:  Goal status: INITIAL  2.  Pt will verbalize understanding of local PD community resources, including fitness post DC.   Baseline:  Goal status: INITIAL  3.  MiniBest to be completed and LTG written Baseline:  Goal status: INITIAL   ASSESSMENT:  CLINICAL IMPRESSION: Patient is a 71 year old female referred to Neuro OPPT for PD. Pt's PMH is significant for: HTN, DM, high chol, CKD, Parkinson's. The following deficits were present during the exam: impaired safety awareness, decreased balance, impaired body mechanics, decreased postural control and impaired vision w/downward gaze. Based on fall history, impaired vision and 5x STS, pt is an incr risk for falls. Pt would benefit from skilled PT to address these impairments and functional limitations to maximize functional mobility independence.    OBJECTIVE IMPAIRMENTS decreased activity tolerance, decreased balance, decreased cognition, decreased coordination, decreased knowledge of condition, decreased knowledge of use of DME, decreased mobility, difficulty walking, decreased strength, decreased safety awareness, impaired perceived functional ability, impaired vision/preception, and improper body mechanics.   ACTIVITY LIMITATIONS carrying, lifting, bending, sitting, standing, squatting, stairs, transfers, and locomotion level  PARTICIPATION LIMITATIONS: meal prep, cleaning, laundry, medication management, driving, shopping, community activity, and yard  work  PERSONAL FACTORS Behavior pattern, Fitness, Transportation, and 1 comorbidity: lack of support in home environment  are also affecting patient's functional outcome.   REHAB POTENTIAL: Good  CLINICAL DECISION MAKING: Evolving/moderate complexity  EVALUATION COMPLEXITY: Moderate  PLAN: PT FREQUENCY: 2x/week  PT DURATION: 8  weeks  PLANNED INTERVENTIONS: Therapeutic exercises, Therapeutic activity, Neuromuscular re-education, Balance training, Gait training, Patient/Family education, Stair training, DME instructions, Manual therapy, and Re-evaluation  PLAN FOR NEXT SESSION: Finish MiniBest (could not promote stepping strategies), review previous HEP and update, sit <>stands, fall prevention strategies    Gracelynne Benedict E Geanie Pacifico, PT, DPT 10/08/2021, 9:00 AM

## 2021-10-08 NOTE — Therapy (Signed)
OUTPATIENT OCCUPATIONAL THERAPY PARKINSON'S EVALUATION  Patient Name: April Larson MRN: [id number] DOB:12-Sep-1950, 71 y.o., female Today's Date: 10/08/2021  PCP: Glendale Chard, MD REFERRING PROVIDER: [name], MD    OT End of Session - 10/08/21 1121     Visit Number 1    Number of Visits 17    Date for OT Re-Evaluation 12/03/21    Authorization Type [employer] MCR    Progress Note Due on Visit 10    OT Start Time 0845    OT Stop Time 0930    OT Time Calculation (min) 45 min    Activity Tolerance Patient tolerated treatment well    Behavior During Therapy [facility] for tasks assessed/performed             Past Medical History:  Diagnosis Date   Diabetes mellitus without complication ([place])    Hypertension    Substance abuse ([place])    Past Surgical History:  Procedure Laterality Date   EYE SURGERY     Patient Active Problem List   Diagnosis Date Noted   Decreased dorsalis pedis pulse 11/17/2020   Tachycardia 08/11/2020   Cough 08/11/2020   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   Pure hypercholesterolemia 01/23/2020   Vitamin D deficiency 10/23/2019   Right hip pain 05/15/2018   Estrogen deficiency 05/15/2018   Class 1 obesity due to excess calories with serious comorbidity and body mass index (BMI) of 31.0 to 31.9 in adult 05/15/2018   Diabetes mellitus with stage 2 chronic kidney disease ([place]) 01/11/2018   Benign hypertension with chronic kidney disease, stage II 01/11/2018    ONSET DATE: 08/25/21 (referral date)   REFERRING DIAG: G20ICD-10-CM Primary parkinsonism ([place])   THERAPY DIAG:  Other symptoms and signs involving the nervous system  Stiffness of right elbow, not elsewhere classified  Other lack of coordination  Unsteadiness on feet  Abnormal posture  Attention and concentration deficit  Visuospatial deficit  Muscle weakness (generalized)  Rationale for Evaluation and Treatment Rehabilitation  SUBJECTIVE:   SUBJECTIVE  STATEMENT: It takes me longer to do things Pt accompanied by: self  PERTINENT HISTORY: 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 No  FALLS: Has patient fallen in last 6 months? Yes. 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[place]), and Leisure: watching TV  PATIENT GOALS walking and balance, handwriting  OBJECTIVE:   HAND DOMINANCE: Right  ADLs: Overall ADLs: taking longer to perform ADLS Transfers/ambulation related to ADLs: Eating: takes time Grooming: takes longer UB Dressing: difficulty hooking bra, takes longer LB Dressing: takes longer Toileting: mod I Bathing: takes longer and sits to bathe (shower chair)  Tub Shower transfers: difficulty stepping in/out Equipment: Shower seat with back   IADLs: Shopping: mod I - slower Light housekeeping: takes longer Meal Prep: pt does when she feels like it, eats left overs and microwaveable items when she doesn't feel like it Community mobility: drives during the day Medication management: uses [facility] (puts in packets for her) - taking carbidopa/levodopa 4x/day Financial management: Sister oversees - but pt does Handwriting: Moderate micrographia (especially near end of sentence) and decreased processing and spelling  MOBILITY STATUS:  not using device, tends  to fall forwards per pt report  POSTURE COMMENTS:  forward head   FUNCTIONAL OUTCOME MEASURES: Standing functional reach: Right: 9 inches (partly d/t lack of full elbow ext); Left: 12 inches Fastening/unfastening 3 buttons: 56.19 sec Physical performance test: PPT#2 (simulated  eating) 20.10 sec (holds spoon at end)  & PPT#4 (donning/doffing jacket): 16.12  COORDINATION: 9 Hole Peg test: Right: 40.06 sec; Left: 28.38 sec Box and Blocks:  Right 34 blocks, Left 38 blocks No tremors reported or observed in evaluation  UE ROM:  WFL except Rt elbow extension -30*, and lacks full wrist ext and thumb radial abduction RUE. Bilateral thumbs tend to draw back into palmer abduction, however Rt worse than Lt.    SENSATION: Not tested    MUSCLE TONE: RUE: Moderate and Hypertonic elbow, minimal at wrist, LUE: slight catch at wrist  COGNITION: Overall cognitive status: Impaired and demo slower processing, some expressive aphasia noted in writing and reports difficulty finding correct words at times   OBSERVATIONS: Bradykinesia, Hypokinesia, Bradyphrenia, and visual changes (no vertical up/down OROM during exam, limited side to side OROM and loses fixation. See recent ophthalmology note - diagnosed w/ supranuclear palsy   TODAY'S TREATMENT:  N/A today   PATIENT EDUCATION: Education details: OT POC Person educated: Patient Education method: Explanation Education comprehension: verbalized understanding   HOME EXERCISE PROGRAM: Not yet addressed  ASSESSMENT:  CLINICAL IMPRESSION: Patient is a 71 y.o. 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 Pt presents with decreased balance, visual deficits, fine motor deficits, micrographia, bradykinesia, bradyphrenia, and overall takes longer to perform ADLS. Pt reports she is still driving during the day and working (but off for summer). Pt would benefit from O.T. to address above deficits, issue PD specific HEP, improve overall ADL performance and safety, and reduce future related Parkinsonism complications.  PERFORMANCE DEFICITS in functional skills including ADLs, IADLs, coordination, dexterity, proprioception, ROM, FMC,  mobility, balance, body mechanics, endurance, decreased knowledge of precautions, decreased knowledge of use of DME, vision, and UE functional use, cognitive skills including energy/drive, memory, perception, problem solving, safety awareness, and understand, and psychosocial skills including coping strategies.   IMPAIRMENTS are limiting patient from ADLs, IADLs, work, and social participation.   COMORBIDITIES may have co-morbidities  that affects occupational performance. Patient will benefit from skilled OT to address above impairments and improve overall function.  MODIFICATION OR ASSISTANCE TO COMPLETE EVALUATION: No modification of tasks or assist necessary to complete an evaluation.  OT OCCUPATIONAL PROFILE AND HISTORY: Detailed assessment: Review of records and additional review of physical, cognitive, psychosocial history related to current functional performance.  CLINICAL DECISION MAKING: Moderate - several treatment options, min-mod task modification necessary  REHAB POTENTIAL: Fair impaired vision and cognition  EVALUATION COMPLEXITY: Moderate     GOALS: Goals reviewed with patient? Yes  SHORT TERM GOALS: Target date: 11/05/2021   Independent w/ modified PD HEP (? Bag ex's, focus on ROM, trunk) Baseline: Goal status: INITIAL  2.  Pt to verbalize understanding w/ safety and fall prevention techniques including DME for showering Baseline:  Goal status: INITIAL  3.  Pt to improve standing functional reach test to 10" or greater Rt side Baseline: 9" (Lt = 12")  Goal status: INITIAL  4.  Pt to verbalize understanding with handwriting strategies and demo minimal micrographia for 3 sentences Baseline:  Goal status: INITIAL  5.  Pt will verbalize understanding of adaptive strategies to increase ease/efficiency  and safety w/ ADLS/IADLS Baseline:  Goal status: INITIAL  6.  Pt to improve Rt hand coordination as evidenced by performing 9 hole peg test in 35 sec or  less Baseline: Rt = 40 sec (Lt = 28 sec)  Goal status: INITIAL  LONG TERM GOALS: Target date: 12/03/2021    Pt will verbalize understanding of ways to prevent future related complications and appropriate community resources prn Baseline:  Goal status: INITIAL  2.  Pt will verbalize understanding of cognitive compensations Baseline:  Goal status: INITIAL  3.  Pt will demo visual compensations and safely navigate environment w/ 80% accuracy in environmental scanning Baseline:  Goal status: INITIAL  4.  Pt will improve button/unbutton test to 50 sec or less Baseline: 56 sec Goal status: INITIAL  5.  Pt demo full reaching RUE w/ elbow ext to -20* or less Baseline: -30* Goal status: INITIAL   PLAN: OT FREQUENCY: 2x/week  OT DURATION: 8 weeks, plus eval  PLANNED INTERVENTIONS: self care/ADL training, therapeutic exercise, therapeutic activity, neuromuscular re-education, passive range of motion, balance training, functional mobility training, patient/family education, cognitive remediation/compensation, visual/perceptual remediation/compensation, coping strategies training, DME and/or AE instructions, and Re-evaluation  RECOMMENDED OTHER SERVICES: ? Driving eval, resources for Assisted living  CONSULTED AND AGREED WITH PLAN OF CARE: Patient  PLAN FOR NEXT SESSION: focus on safety and efficiency w/ ADLS, visual compensations   Hans Eden, OT 10/08/2021, 11:22 AM

## 2021-10-08 NOTE — Progress Notes (Signed)
Subjective:    Patient ID: April Larson is a 71 y.o. female.  HPI    Interim history:   April Larson is a 71 year old right-handed woman with an underlying medical history of hypertension, diabetes, reflux disease, allergies, sleep apnea and borderline obesity, who presents for follow-up consultation of her parkinsonism.  She is accompanied by her sister, April Larson, again today. I last saw her on 04/09/2021, at which time she reported not noticing much in the way of benefit with Sinemet, she was taking 1 pill 3 times daily.  She was advised to increase it to 1 pill 4 times daily.  She reported taking longer to process things.  She saw her ophthalmologist in the interim and also neuro-ophthalmologist Dr. Hassell Done in May 2023.  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 I reviewed the note.  I also reviewed Dr. Zenia Resides office visit note.  I made a referral to neuro rehab in the interim in May 2023.  Today, 70/09/2021: She reports feeling more or less stable.  She did have a fall since her last visit and jammed her eyeglasses onto the nasal bridge and still has a hypopigmented scar from it.  She thought she had lost her keys and put them accidentally in the trash can and was looking in the big trash container outside in the yard for it and toppled over the trash can and hurt herself in the process but no serious injuries thankfully.  She is back in outpatient therapy, is going to start twice weekly with neuro rehab, sister reports that she was not really doing the exercises consistently after she had finished therapy last time and she is going to make sure that patient follows through with recommendations and continues with the recommendations and exercises even during the week and afterwards.  Cognitively, patient feels stable.  She takes her levodopa 4 times a day, generally around 7 AM, 10 AM, 5 or 6 PM and 11 PM.  She goes to bed around 11 PM currently as she is not working but she will  pick up work again after school starts.  She is going to try to be in bed earlier than.  She naps typically from 4 to 5 PM daily.  She feels that the levodopa is helping, not able to say specifically how.    Previously (copied from previous notes for reference):      I saw her on 12/24/2020, at which time she felt fairly stable, had no recent falls.  She was still driving but limiting herself to daytime driving and familiar routes.  She was noted to be more forgetful and have a tendency to repeat herself per her sister's observation.  I made a referral to neuro rehab.  She was advised to start Sinemet gradual titration.   Subsequently, we requested home health therapy   I saw her on 07/23/2020 at the request of her primary care physician, at which time she reported a several month history of difficulty with her balance, recent fall, problems with fine motor control and speech.   She had a DaTscan on 12/19/2020 and I reviewed the results: IMPRESSION: Decreased radiotracer activity within the striata with greater deficit on the LEFT. Findings are suggestive of Parkinsonian syndrome pathology.   Of note, DaTSCAN is not diagnostic of Parkinsonian syndromes, which remains a clinical diagnosis. DaTscan is an adjuvant test to aid in the clinical diagnosis of Parkinsonian syndromes.   We called her with her test results.  07/23/20: (She) reports a 3+ month history of difficulty with her balance, recent fall with head injury, feeling wobbly, feeling like she could fall again, problems with fine motor control, slowness, change in her speech.  Her sister reports that she has noticed changes in patient's behavior and that she seems to be less attentive at times, seems to zone out.  She has not had any passing out spells or convulsions or twitching or involuntary movements, no tremors.  However, she did take a significant fall.  Patient reports that she fell when she was working at [employer].  This was  on 03/27/2020.  She was in the process of knocking at the door where the ushers are and the next thing she realized was that she was on the floor.  She did not have any warning sign, no lightheadedness, no spinning sensation, no headache.  She was assisted by a nurse that was onsite.  She was taken to the emergency room.  I reviewed the emergency room record from 03/27/2020.  She went to [facility] ED.   She did sustain a rib fracture of the left 10th rib.  She reports that after she had her rib fracture, she stopped using her CPAP but otherwise she has been compliant with it.  After about a month she resumed using her CPAP.  She has not had any difficulty swallowing but has noticed changes in her speech.  Her sister reports that she seems to speak very deliberately and slowly and also eats slowly.  Patient reports that her handwriting has changed.  It has become much more tremulous and sloppy.  She is to have a good penmanship.   I reviewed your office note from 04/15/2020.  You ordered a brain MRI.   She had a brain MRI with and without contrast on 06/07/2020 and I reviewed the results: IMPRESSION: No evidence of recent infarction, hemorrhage, or mass. No abnormal enhancement.   Mild chronic microvascular ischemic changes.    She had recent labs through your office on 04/15/2020.  Vitamin B12 was 1323, TSH was 1.83, urinalysis negative, CMP showed a glucose of 90, BUN 22, creatinine 0.91, alk phos was elevated at 196, AST and ALT normal.  A1c was mildly elevated at 7.1.  CBC showed WBC at 1.1, hemoglobin 12.4, hematocrit 37.7, platelets mildly elevated at 480.   She had a head CT without contrast on 03/27/2020 and I reviewed the results: IMPRESSION: 1. Left frontal scalp soft tissue swelling without underlying fracture. 2. No acute intracranial abnormality. 3. Mild generalized atrophy and white matter disease likely reflects the sequela of chronic microvascular ischemia.   I previously  evaluated her over 2 years ago for sleep apnea.  She had a baseline sleep study on 04/10/2018 which showed an AHI of 12.3/h, REM AHI of 24.3/h, O2 nadir 88%.  She had a CPAP titration study on 06/05/2018 at which time she was recommended to start CPAP at 9 cm of water pressure.   She did not return for follow-up.  She has been on CPAP.  Current 30-day compliance showed excellent compliance with an average usage of 5 hours and 48 minutes, residual AHI at goal at 3.3/h, leak on the low side, pressure at 9 cm.  Percent use days greater than 4 hours at 90%.    03/09/18: April Larson is a 71 year old right-handed woman with an underlying medical history of type 2 diabetes, chronic kidney disease, hypertension, seasonal allergies, and obesity, who was previously diagnosed  with obstructive sleep apnea. She was placed on CPAP therapy. She has not had re-evaluation in years. Prior sleep study results are not available for my review today. I reviewed your office note from 01/12/2018. She was complaining of memory loss. she had blood work through your office on 01/12/2018, including RPR, B12, lipid profile, A1c, CMP, TSH. I reviewed the test results. Blood tests were benign, A1c 6.5. Her Epworth sleepiness score is 16 out of 24, fatigue score is 42 out of 63. She reports that she could not tolerate CPAP in the past. She reports that she had a sinus infection after she started using CPAP. She estimates that this was about 15 years ago. She is widowed; sadly, her husband passed away in 04/24/2017. She lives alone, no children, has 1 dog in the household, he sleeps on the bed with her. She is retired. She quit smoking in 2004, does not utilize alcohol and does not drink caffeine on a regular basis. Her bedtime is around 11:30 and she is typically asleep at midnight. She does have a TV on in her bedroom which tends to stay on all night. Rise time is around 6:50 AM. She has nocturia about once per average night and denies  recurrent morning headaches. She is not aware of any family history of OSA. She typically takes a nap for an hour around 4 PM daily. She tried a fullface mask in the past for her CPAP machine and it was uncomfortable. She would be willing to get retested and consider CPAP therapy again. She has had some short-term memory issues.       Her Past Medical History Is Significant For: Past Medical History:  Diagnosis Date   Diabetes mellitus without complication ([place])    Hypertension    Substance abuse ([place])     Her Past Surgical History Is Significant For: Past Surgical History:  Procedure Laterality Date   EYE SURGERY      Her Family History Is Significant For: Family History  Problem Relation Age of Onset   Cancer Mother    Diabetes Father    Cancer Brother    Breast cancer Maternal Aunt    Parkinsonism Neg Hx     Her Social History Is Significant For: Social History   Socioeconomic History   Marital status: Widowed    Spouse name: Not on file   Number of children: Not on file   Years of education: Not on file   Highest education level: Not on file  Occupational History   Occupation: retired  Tobacco Use   Smoking status: Former    Packs/day: 1.00    Years: 34.00    Total pack years: 34.00    Types: Cigarettes    Start date: 50    Quit date: 04/08/2005    Years since quitting: 16.5   Smokeless tobacco: Never  Vaping Use   Vaping Use: Never used  Substance and Sexual Activity   Alcohol use: No    Alcohol/week: 0.0 standard drinks of alcohol   Drug use: No   Sexual activity: Not Currently  Other Topics Concern   Not on file  Social History Narrative   Not on file   Social Determinants of Health   Financial Resource Strain: Low Risk  (07/23/2021)   Overall Financial Resource Strain (CARDIA)    Difficulty of Paying Living Expenses: Not hard at all  Food Insecurity: No Food Insecurity (07/23/2021)   Hunger Vital Sign    Worried About Running  Out of Food in  the Last Year: Never true    Ran Out of Food in the Last Year: Never true  Transportation Needs: No Transportation Needs (07/23/2021)   PRAPARE - Hydrologist (Medical): No    Lack of Transportation (Non-Medical): No  Physical Activity: Inactive (07/23/2021)   Exercise Vital Sign    Days of Exercise per Week: 0 days    Minutes of Exercise per Session: 0 min  Stress: No Stress Concern Present (07/23/2021)   [place]    Feeling of Stress : Not at all  Social Connections: Not on file    Her Allergies Are:  No Known Allergies:   Her Current Medications Are:  Outpatient Encounter Medications as of 10/08/2021  Medication Sig   ACCU-CHEK FASTCLIX LANCETS MISC USE TO CHECK BLOOD SUGARS TWICE DAILY   ACCU-CHEK GUIDE test strip USE TO CHECK BLOOD SUGARS TWICE DAILY AS DIRECTED   aspirin EC 81 MG tablet Take 1 tablet (81 mg total) by mouth daily.   atorvastatin (LIPITOR) 10 MG tablet Take 1 tablet (10 mg total) by mouth daily.   Biotin 10 MG CAPS Take 1 tablet by mouth daily.   Blood Glucose Monitoring Suppl (ACCU-CHEK GUIDE ME) w/Device KIT USE TO CHECK BLOOD GLUCOSE TWICE DAILY   calcium carbonate (OSCAL) 1500 (600 Ca) MG TABS tablet Take 1 tablet (1,500 mg total) by mouth daily with breakfast.   carbidopa-levodopa (SINEMET IR) 25-100 MG tablet TAKE ONE TABLET BY MOUTH four TIMES DAILY. FOLLOW titration instructions provided separately in writing   Cholecalciferol 125 MCG (5000 UT) TABS Take 1 tablet (5,000 Units total) by mouth daily.   diclofenac Sodium (VOLTAREN) 1 % GEL APPLY 2 GRAMS TOPICALLY TO THE AFFECTED AREA FOUR TIMES DAILY   fluticasone (FLONASE) 50 MCG/ACT nasal spray instill TWO SPRAYS into each nostril daily AS DIRECTED   Insulin Pen Needle (B-D ULTRAFINE III SHORT PEN) 31G X 8 MM MISC Use as directed   loratadine (CLARITIN) 10 MG tablet Take 1 tablet by mouth daily   metFORMIN  (GLUCOPHAGE) 500 MG tablet Take 1 tablet (500 mg total) by mouth daily with breakfast. Dinner   Multiple Vitamins-Minerals (ONE-A-DAY WOMENS 50+ ADVANTAGE) TABS Take 1 tablet by mouth daily   NON FORMULARY 2 each. Calm   Omega-3 Fatty Acids (FISH OIL) 1000 MG CAPS Take 1 capsule by mouth daily (Patient not taking: Reported on 07/23/2021)   omeprazole (PRILOSEC) 40 MG capsule TAKE 1 CAPSULE BY MOUTH EVERY DAY BEFORE A MEAL   pioglitazone (ACTOS) 15 MG tablet Take 1 tablet (15 mg total) by mouth daily. Monday, wednesday, Friday   telmisartan-hydrochlorothiazide (MICARDIS HCT) 40-12.5 MG tablet Take 1 tablet by mouth daily.   Turmeric 500 MG CAPS Take 1 capsule by mouth 2 (two) times daily.   vitamin C (ASCORBIC ACID) 500 MG tablet Take 1 tablet (500 mg total) by mouth daily.   XULTOPHY 100-3.6 UNIT-MG/ML SOPN INJECT 25 UNITS SUBCUTANEOUSLY DAILY   No facility-administered encounter medications on file as of 10/08/2021.  :  Review of Systems:  Out of a complete 14 point review of systems, all are reviewed and negative with the exception of these symptoms as listed below:   Review of Systems  Neurological:        No changes.  No new concerns.     Objective:  Neurological Exam  Physical Exam Physical Examination:   Vitals:   10/08/21 1051  BP: (!) 156/90  Pulse: 67    General Examination: The patient is a very pleasant 71 y.o. female in no acute distress. She appears well-developed and well-nourished and well groomed.   HEENT: Normocephalic, atraumatic, pupils are equal, round and reactive to light, extraocular tracking is quite impaired, slow eye movements side to side but significant limitation to upgaze and downgaze, no nystagmus.  Decrease in eye blink rate noted and moderate facial masking noted.  She has moderate nuchal rigidity and decreased active range of motion in her neck.  She has at times very slow speech, mild hypophonia at times.  She has minimal dysarthria at times.  She  has no obvious sialorrhea.  Tongue protrudes centrally and palate elevates symmetrically, no carotid bruits.  Scar nasal bridge.   Chest: Clear to auscultation without wheezing, rhonchi or crackles noted.   Heart: S1+S2+0, regular and normal without murmurs, rubs or gallops noted.    Abdomen: Soft, non-tender and non-distended.   Extremities: There is no pitting edema in the distal lower extremities bilaterally.    Skin: Warm and dry without trophic changes noted.   Musculoskeletal: exam reveals no obvious joint deformities.   Neurologically:  Mental status: The patient is awake, alert and oriented in all 4 spheres. Her immediate and remote memory, attention, language skills and fund of knowledge are fairly appropriate. Thought process is linear.  She has bradyphrenia, and sister provide some details.    10/08/2021   10:59 AM 12/24/2020   10:28 AM  MMSE - Mini Mental State Exam  Orientation to time 5 5  Orientation to Place 5 5  Registration 3 3  Attention/ Calculation 4 3  Recall 3 3  Language- name 2 objects 2 2  Language- repeat 1 1  Language- follow 3 step command 3 3  Language- read & follow direction 1 1  Write a sentence 1 1  Copy design 0 0  Total score 28 27    On 12/24/2020: CDT: 3/4, AFT: 9/min.  On 10/08/2021: CDT: 3.5/4, AFT: 6/min.  Cranial nerves II - XII are as described above under HEENT exam.  Motor exam: Normal bulk, global strength of 4+ out of 5, increase in tone in the right more than left upper extremity, no obvious cogwheeling noted.  No resting tremor noted.  Romberg is not tested due to safety concerns.  Moderate bradykinesia.  Fine motor skill testing reveals decreased ability with right-sided predominance of finger taps, and foot taps, in the moderate range, mild to moderate impairment of hand movements bilaterally, mild to moderate impairment of rapid alternating patting in the bilateral hands.   Cerebellar testing: No dysmetria or intention tremor.   Sensory exam: intact to light touch.  Gait, station and balance: She stands with mild difficulty and pushes herself up.  Posture is age-appropriate to mildly stooped, stance with mildly wider base, left shoulder slightly higher than right, slight right tilt.  She has no walking aid.  She walks slowly and cautiously, mildly decreased arm swing noted right more than left, no obvious shuffling noted, quite stable.   Assessment and Plan:  In summary, April Larson is a very pleasant 71 year old female with an underlying medical history of hypertension, diabetes, reflux disease, allergies, sleep apnea and borderline obesity, who presents for follow-up consultation of her parkinsonism. She had a DaTscan on 12/19/2020 which showed decreased radiotracer activity within the striata with greater deficit on the left.  She does have a little bit of lateralization of  her symptoms and clinical findings to the right.  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  She had evaluation with neuro-ophthalmology in May 2023 and Dr. 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 She had a brain MRI which showed relatively benign findings.  We started Sinemet in September 2022.  We increase this to 1 pill 4 times daily in January 2023.  She tolerates the medication and feels that it is beneficial at this time.  She has benefited from therapy as well, and is currently back in outpatient PT, OT and ST through neuro rehab.  She is advised to continue with Sinemet 1 pill 4 times daily.  She is encouraged to continue with her exercise regimen and handouts. We will continue to monitor her memory and may consider memory medication in the near future.  Findings are fairly stable currently.  She is advised to follow-up in this clinic in about 6 months to see one of our nurse practitioners, sooner if needed.  I answered all their questions  today and the patient and her sister were in agreement. I spent 30 minutes in total face-to-face time and in reviewing records during pre-charting, more than 50% of which was spent in counseling and coordination of care, reviewing test results, reviewing medications and treatment regimen and/or in discussing or reviewing the diagnosis of atypical parkinsonism, the prognosis and treatment options. Pertinent laboratory and imaging test results that were available during this visit with the patient were reviewed by me and considered in my medical decision making (see chart for details).

## 2021-10-08 NOTE — Patient Instructions (Signed)
It was nice to see you both again today.  Please continue with therapy through neuro rehab and follow their advice and handouts/instructions.  Please follow-up to see one of our nurse practitioners in 6 months.  We will maintain your levodopa at 1 pill 4 times a day for now.  Memory scores are stable.

## 2021-10-08 NOTE — Therapy (Signed)
OUTPATIENT SPEECH LANGUAGE PATHOLOGY PARKINSON'S EVALUATION   Patient Name: April Larson MRN: [id number] DOB:[birth year], 71 y.o., female Today's Date: 10/08/2021  PCP: Glendale Chard, MD REFERRING PROVIDER: [name], MD    End of Session - 10/08/21 0930     Visit Number 1    Number of Visits 17    Date for SLP Re-Evaluation 12/04/21    Authorization Type [employer]    Authorization Time Period $20 co pay per day    SLP Start Time 0931    SLP Stop Time  1015    SLP Time Calculation (min) 44 min    Activity Tolerance Patient tolerated treatment well             Past Medical History:  Diagnosis Date   Diabetes mellitus without complication ([place])    Hypertension    Substance abuse ([place])    Past Surgical History:  Procedure Laterality Date   EYE SURGERY     Patient Active Problem List   Diagnosis Date Noted   Decreased dorsalis pedis pulse 11/17/2020   Tachycardia 08/11/2020   Cough 08/11/2020   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   Pure hypercholesterolemia 01/23/2020   Vitamin D deficiency 10/23/2019   Right hip pain 05/15/2018   Estrogen deficiency 05/15/2018   Class 1 obesity due to excess calories with serious comorbidity and body mass index (BMI) of 31.0 to 31.9 in adult 05/15/2018   Diabetes mellitus with stage 2 chronic kidney disease ([place]) 01/11/2018   Benign hypertension with chronic kidney disease, stage II 01/11/2018    ONSET DATE: PD dx 2021; 08/25/2021 (referral date)  REFERRING DIAG: R47.1ICD-10-CM Dysarthria and anarthria  R41.841ICD-10-CM Cognitive communication deficit  R13.10ICD-10-CM Dysphagia, unspecified type   THERAPY DIAG:  Cognitive communication deficit  Dysarthria and anarthria  Dysphagia, unspecified type  Rationale for Evaluation and Treatment Rehabilitation  SUBJECTIVE:   SUBJECTIVE STATEMENT: "Everything is slower"  Pt accompanied by: self  PERTINENT HISTORY: Pt dx with PD in 2021. 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   PAIN:  Are you having pain? No  FALLS: Has patient fallen in last 6 months?  Yes, See PT evaluation for details  LIVING ENVIRONMENT: Lives with: lives alone Lives in: House/apartment  PLOF:  Level of assistance: Needed assistance with IADLS Employment: Part-time employment   PATIENT GOALS "to speak fluently, to remember"  OBJECTIVE:   COGNITION: Overall cognitive status: Impaired Areas of impairment: Attention, Memory, Awareness, and Problem solving Comments: At PD screen in May 2023, pt c/o "jumbled words" with pt inserting wrong words, which is frustrating and leads to insecurity and avoidance of talking. Pt is experiencing slower processing and changes in attention, resulting in pt getting lost in conversation and trouble organizing thoughts. Today, pt endorsed difficulty with short term recall and attention but unable to provide functional examples despite prompting and questioning cues. Pt denied any difficulty managing iADLs but no family present to confirm.   MOTOR SPEECH: Overall motor speech: impaired Level of impairment: Conversation Respiration: thoracic breathing and clavicular breathing Phonation: breathy and low vocal intensity Resonance: WFL Articulation: Impaired: sentence and conversation Intelligibility: Intelligibility reduced Motor planning: Impaired: unaware and inconsistent Effective technique: slow rate and increased vocal intensity  ORAL MOTOR EXAMINATION Overall status: Impaired:   Lingual: Right (ROM and Coordination) Left (ROM and Coordination) Comments: Reduced bilateral lingual coordination and decreased rate   OBJECTIVE VOICE ASSESSMENT: Sustained "ah" maximum phonation time: 4-10 seconds Sustained "ah" loudness average: 82 dB Oral reading (passage) loudness average: 68 dB Oral reading loudness  range: 62-72 dB Conversational loudness average: 66 dB Conversational loudness range: 63-68 dB Voice quality: breathy and low vocal  intensity Stimulability trials: Given SLP modeling and occasional mod cues, loudness average increased to 70 dB at sentence level.  Comments: Inconsistent loudness reported by sister (occasionally too loud). Today, pt averaged mid to upper 60s dB in conversation.   Pt does report difficulty with swallowing warranting further evaluation. At PD screen, pt's sister reported pt coughing with everal meal with both solids and liquids. Today, pt reported difficulty with solids and stated "it comes out the front of my mouth." Pt believes focus may be contributing factor. Will plan to assess in upcoming session due to time constraints today.   BNT completed today due to increased difficulty with word retrieval and expressive language, with pt exhibited intermittent delayed responses, occasional self-corrections when needed, and intermittent articulation/phonemic errors (ex: hammerick for hammock, stellascope for stethoscope, rhinosaur for rhinoceros). Following BNT, pt inquired "why do I say I don't know so much?" with pt indicating some difficulty with verbal expression, short term recall, and processing.   PATIENT REPORTED OUTCOME MEASURES (PROM): Not completed due to time constraints and no family members present to confirm   TODAY'S TREATMENT:  10-08-21: Educated patient on evaluation results and clinical observations. Pt verbalized understanding and agreement with initiation of ST POC to address cognitive linguistic changes, dysarthria, and possibly dysphagia.   PATIENT EDUCATION: Education details: see above Person educated: Patient Education method: Customer service manager Education comprehension: verbalized understanding, returned demonstration, and needs further education   GOALS: Goals reviewed with patient? Yes  SHORT TERM GOALS: Target date: 11/05/2021   Pt will complete clinical swallow assessment in first 1-2 ST sessions and pt will complete MBSS as needed for additional assessment  per SLP recommendation  Baseline: Goal status: INITIAL  2.  Pt will utilize dysarthria compensations to optimize vocal intensity and clarity in short structured conversations given occasional mod A over 2 sessions Baseline:  Goal status: INITIAL  3.  Pt will utilize word retrieval strategies to aid anomia/dysnomia on structured speech tasks given occasional mod A over 2 sessions Baseline:  Goal status: INITIAL  4.  Pt will implement attention/processing strategies in short structured conversations to aid patient engagement given occasional mod A over 2 sessions  Baseline:  Goal status: INITIAL  5.  Pt will implement memory compensations to optimize performance at work and home given occasional mod A over 2 sessions Baseline:  Goal status: INITIAL  6.  Family will utilize techniques to aid patient processing speed and comprehension given occasional min A over 2 sessions  Baseline:  Goal status: INITIAL  LONG TERM GOALS: Target date: 12/04/2021    Pt will implement attention/processing strategies in 10-15 minute conversations to aid patient engagement given occasional min A over 2 sessions  Baseline:  Goal status: INITIAL  2.  Pt will demonstrate ability to ID and correct/compensate for anomia/dysnomia in 10-15 minute conversations given occasional min A over 2 sessions Baseline:  Goal status: INITIAL  3.  Pt will implement memory compensations to optimize performance at work and home given occasional min A over 2 sessions Baseline:  Goal status: INITIAL  4.  Family will utilize techniques to aid patient processing speed and comprehension given rare min A over 2 sessions  Baseline:  Goal status: INITIAL  5.  Pt will consume safest and least restrictive diet with reduced s/sx of aspiration with use of trained techniques as needed over 2 sessions Baseline:  Goal status: INITIAL  ASSESSMENT:  CLINICAL IMPRESSION: Patient is a 71 y.o. female who was seen today for atypical  Parkinson's Disease. Pt arrived alone today. Endorsed slower processing overall. Pt endorsed changes in short term recall and attention but unable to provide specific examples even with SLP prompting and questioning cues. "Jumbled words" reported during recent PD screen, which reportedly causes patient to avoid conversations. Assessed word retrieval via BNT with intermittent phonemic/articulation errors exhibited, intermittent delayed processing noted, and occasional need for semantic/written cues. Overall, conversational volume averaged mid to upper 60s dB with intelligibility not affected in quiet environment. Pt endorsed difficulty swallowing solids as it "falls out front of mouth." Will plan to further assess next session due to time constraints. Given recent changes in cognition, communication, and swallowing, pt would benefit from skilled ST intervention to optimize functional independence and safety.   OBJECTIVE IMPAIRMENTS  Objective impairments include attention, memory, executive functioning, expressive language, dysarthria, and dysphagia. These impairments are limiting patient from household responsibilities, ADLs/IADLs, effectively communicating at home and in community, and safety when swallowing.Factors affecting potential to achieve goals and functional outcome are ability to learn/carryover information, cooperation/participation level, medical prognosis, and previous level of function. Patient will benefit from skilled SLP services to address above impairments and improve overall function.  REHAB POTENTIAL: Fair - hx of reduced carryover of HEP  PLAN: SLP FREQUENCY: 2x/week  SLP DURATION: 8 weeks  PLANNED INTERVENTIONS: Language facilitation, Environmental controls, Cueing hierachy, Cognitive reorganization, Internal/external aids, Functional tasks, Multimodal communication approach, SLP instruction and feedback, Compensatory strategies, and Patient/family education    Marzetta Board, CCC-SLP 10/08/2021, 9:30 AM

## 2021-10-09 DIAGNOSIS — Z7982 Long term (current) use of aspirin: Secondary | ICD-10-CM | POA: Diagnosis not present

## 2021-10-09 DIAGNOSIS — E785 Hyperlipidemia, unspecified: Secondary | ICD-10-CM | POA: Diagnosis not present

## 2021-10-09 DIAGNOSIS — Z794 Long term (current) use of insulin: Secondary | ICD-10-CM | POA: Diagnosis not present

## 2021-10-09 DIAGNOSIS — Z9181 History of falling: Secondary | ICD-10-CM | POA: Diagnosis not present

## 2021-10-09 DIAGNOSIS — I10 Essential (primary) hypertension: Secondary | ICD-10-CM | POA: Diagnosis not present

## 2021-10-09 DIAGNOSIS — Z87891 Personal history of nicotine dependence: Secondary | ICD-10-CM | POA: Diagnosis not present

## 2021-10-09 DIAGNOSIS — Z809 Family history of malignant neoplasm, unspecified: Secondary | ICD-10-CM | POA: Diagnosis not present

## 2021-10-09 DIAGNOSIS — Z7984 Long term (current) use of oral hypoglycemic drugs: Secondary | ICD-10-CM | POA: Diagnosis not present

## 2021-10-09 DIAGNOSIS — Z833 Family history of diabetes mellitus: Secondary | ICD-10-CM | POA: Diagnosis not present

## 2021-10-09 DIAGNOSIS — G20 Parkinson's disease: Secondary | ICD-10-CM | POA: Diagnosis not present

## 2021-10-09 DIAGNOSIS — Z8249 Family history of ischemic heart disease and other diseases of the circulatory system: Secondary | ICD-10-CM | POA: Diagnosis not present

## 2021-10-09 DIAGNOSIS — E119 Type 2 diabetes mellitus without complications: Secondary | ICD-10-CM | POA: Diagnosis not present

## 2021-10-12 ENCOUNTER — Ambulatory Visit (INDEPENDENT_AMBULATORY_CARE_PROVIDER_SITE_OTHER): Payer: Medicare HMO

## 2021-10-12 ENCOUNTER — Telehealth: Payer: Medicare HMO

## 2021-10-12 DIAGNOSIS — E7800 Pure hypercholesterolemia, unspecified: Secondary | ICD-10-CM

## 2021-10-12 DIAGNOSIS — E1122 Type 2 diabetes mellitus with diabetic chronic kidney disease: Secondary | ICD-10-CM

## 2021-10-12 DIAGNOSIS — I129 Hypertensive chronic kidney disease with stage 1 through stage 4 chronic kidney disease, or unspecified chronic kidney disease: Secondary | ICD-10-CM

## 2021-10-12 DIAGNOSIS — G20 Parkinson's disease: Secondary | ICD-10-CM

## 2021-10-12 NOTE — Therapy (Signed)
OUTPATIENT OCCUPATIONAL THERAPY PARKINSON'S EVALUATION  Patient Name: April Larson MRN: [id number] DOB:Mar 14, 1951, 71 y.o., female Today's Date: 10/13/2021  PCP: Glendale Chard, MD REFERRING PROVIDER: [name], MD    OT End of Session - 10/13/21 1107     Visit Number 2    Number of Visits 17    Date for OT Re-Evaluation 12/03/21    Authorization Type [employer] MCR    Authorization - Visit Number 2    Authorization - Number of Visits 2    Progress Note Due on Visit 10    OT Start Time 1105    OT Stop Time 1149    OT Time Calculation (min) 44 min    Activity Tolerance Patient tolerated treatment well    Behavior During Therapy WFL for tasks assessed/performed              Past Medical History:  Diagnosis Date   Diabetes mellitus without complication ([place])    Hypertension    Substance abuse ([place])    Past Surgical History:  Procedure Laterality Date   EYE SURGERY     Patient Active Problem List   Diagnosis Date Noted   Decreased dorsalis pedis pulse 11/17/2020   Tachycardia 08/11/2020   Cough 08/11/2020   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   Pure hypercholesterolemia 01/23/2020   Vitamin D deficiency 10/23/2019   Right hip pain 05/15/2018   Estrogen deficiency 05/15/2018   Class 1 obesity due to excess calories with serious comorbidity and body mass index (BMI) of 31.0 to 31.9 in adult 05/15/2018   Diabetes mellitus with stage 2 chronic kidney disease ([place]) 01/11/2018   Benign hypertension with chronic kidney disease, stage II 01/11/2018    ONSET DATE: 08/25/21 (referral date)   REFERRING DIAG: G20ICD-10-CM Primary parkinsonism ([place])   THERAPY DIAG:  Other lack of coordination  Abnormal posture  Unsteadiness on feet  Other symptoms and signs involving the nervous system  Stiffness of right elbow, not elsewhere classified  Attention and concentration deficit  Visuospatial deficit  Rationale for Evaluation and Treatment  Rehabilitation  SUBJECTIVE:   SUBJECTIVE STATEMENT: Pt reports that nurse mentioned reacher for picking up things due to decr vision and pt inquires about this today.   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 No  PATIENT GOALS walking and balance, handwriting  OBJECTIVE:   TODAY'S TREATMENT:    Pt demo inability to track inferiorly, limited ability/difficulty to track superiorly, difficulty tracking to the L and significant limitation tracking to the R.  Pt appears to demo decr peripheral vision on both sides R>L.    Long discussion regarding visual deficits and visual compensation strategies.  Recommended against driving and working or begin to make plans for discontinuing these activities due to safety concerns (visual, cognitive, and balance deficits) and progressive nature of diagnosis.  (Pt reports that neurologist told her to only drive familiar locations/short distances in daytime.)   For now, if pt is working (at [employer]), to decr risk of falls, pt should hold onto railing when standing/pointing, sit when able and prior to lights dimming, arrive early to ensure maximal lighting and decr people, use elevator to go up instead of steps, ask for help when someone needs escorting--pt reports that she is currently doing most of these strategies.   For now, if driving, pt instructed to  insure any adjustments to car (heat/air, etc) are made prior to driving, avoid eating/drinking, using radio, etc while driving to minimize distractions and visual requirements, consider staying in R lane when possible to reduce driving where she has traffic on both sides of her, only drive in daytime.--However, discussed that she may not be able to see if object/car/person/animal comes from the side quickly enough to react no matter how careful  she is.  Recommended pt discuss safety concerns with driving with eye doctor at next appt and that she may have liability if she gets into an accident due to visual limitations.  Again, emphasized recommendation to make plans to discontinue driving and working due to safety concerns.  Pt instructed in strategies to pick items up from floor to incr safety/decr fall risk and compensate for visual deficits.  Recommended use of reacher (discussed where to purchase--sister reports she has one) and how to use safely.  Recommended/cued pt to always hold onto countertop/something stable with L hand, back away from object so that she can see it.  Also instructed pt to keep feet apart with one in front of other (wider base of support). Practiced use of reacher utilizing strategies to pick various objects up from floor with min-mod cueing for incr safety and min-mod difficulty due to visual deficits.  Also recommended pt sit if able to reach for items on floor.       PATIENT EDUCATION: Education details: Audiological scientist for work and Chemical engineer, strongly cautioned pt against driving and working and recommended pt begin making plans to stop these activities due to safety concerns and progressive nature of diagnosis  Person educated: Patient, sister Education method: Explanation Education comprehension: verbalized understanding   HOME EXERCISE PROGRAM: Not yet addressed  ASSESSMENT:  CLINICAL IMPRESSION: Pt with significant visual deficits affecting balance and safety.   Pt verbalized understanding, but is hesitant to to discontinue driving and working.   PERFORMANCE DEFICITS in functional skills including ADLs, IADLs, coordination, dexterity, proprioception, ROM, FMC, mobility, balance, body mechanics, endurance, decreased knowledge of precautions, decreased knowledge of use of DME, vision, and UE functional use, cognitive skills including energy/drive, memory, perception, problem  solving, safety awareness, and understand, and psychosocial skills including coping strategies.   IMPAIRMENTS are limiting patient from ADLs, IADLs, work, and social participation.   COMORBIDITIES may have co-morbidities  that affects occupational performance. Patient will benefit from skilled OT to address above impairments and improve overall function.  MODIFICATION OR ASSISTANCE TO COMPLETE EVALUATION: No modification of tasks or assist necessary to complete an evaluation.  OT OCCUPATIONAL PROFILE AND HISTORY: Detailed assessment: Review of records and additional review of physical, cognitive, psychosocial history related to current functional performance.  CLINICAL DECISION MAKING: Moderate - several treatment options, min-mod task modification necessary  REHAB POTENTIAL: Fair impaired vision and cognition  EVALUATION COMPLEXITY: Moderate   GOALS: Goals reviewed with patient? Yes  SHORT TERM GOALS: Target date: 11/05/2021   Independent w/ modified PD HEP (? Bag ex's, focus on ROM, trunk) Baseline: Goal status: INITIAL  2.  Pt to verbalize understanding w/ safety and fall prevention techniques including DME for showering Baseline:  Goal status: INITIAL  3.  Pt to improve standing functional reach test to 10" or greater Rt side Baseline: 9" (Lt = 12")  Goal status: INITIAL  4.  Pt to verbalize understanding with handwriting strategies and demo minimal micrographia for 3 sentences Baseline:  Goal status: INITIAL  5.  Pt will verbalize understanding of adaptive strategies  to increase ease/efficiency and safety w/ ADLS/IADLS Baseline:  Goal status: INITIAL  6.  Pt to improve Rt hand coordination as evidenced by performing 9 hole peg test in 35 sec or less Baseline: Rt = 40 sec (Lt = 28 sec)  Goal status: INITIAL  LONG TERM GOALS: Target date: 12/03/2021    Pt will verbalize understanding of ways to prevent future related complications and appropriate community resources  prn Baseline:  Goal status: INITIAL  2.  Pt will verbalize understanding of cognitive compensations Baseline:  Goal status: INITIAL  3.  Pt will demo visual compensations and safely navigate environment w/ 80% accuracy in environmental scanning Baseline:  Goal status: INITIAL  4.  Pt will improve button/unbutton test to 50 sec or less Baseline: 56 sec Goal status: INITIAL  5.  Pt demo full reaching RUE w/ elbow ext to -20* or less Baseline: -30* Goal status: INITIAL   PLAN: OT FREQUENCY: 2x/week  OT DURATION: 8 weeks, plus eval  PLANNED INTERVENTIONS: self care/ADL training, therapeutic exercise, therapeutic activity, neuromuscular re-education, passive range of motion, balance training, functional mobility training, patient/family education, cognitive remediation/compensation, visual/perceptual remediation/compensation, coping strategies training, DME and/or AE instructions, and Re-evaluation  RECOMMENDED OTHER SERVICES: ? Driving eval, resources for Assisted living  CONSULTED AND AGREED WITH PLAN OF CARE: Patient  PLAN FOR NEXT SESSION: visual HEP (tracking, saccades); environmental scanning; review visual compensation strategies and reinforce safety with ADLs due to balance/vision    Thaddius Manes, OTR/L 10/13/2021, 2:25 PM

## 2021-10-13 ENCOUNTER — Ambulatory Visit: Payer: Medicare HMO

## 2021-10-13 ENCOUNTER — Encounter: Payer: Self-pay | Admitting: Occupational Therapy

## 2021-10-13 ENCOUNTER — Ambulatory Visit: Payer: Medicare HMO | Admitting: Occupational Therapy

## 2021-10-13 ENCOUNTER — Ambulatory Visit: Payer: Medicare HMO | Admitting: Physical Therapy

## 2021-10-13 DIAGNOSIS — R471 Dysarthria and anarthria: Secondary | ICD-10-CM

## 2021-10-13 DIAGNOSIS — R1310 Dysphagia, unspecified: Secondary | ICD-10-CM

## 2021-10-13 DIAGNOSIS — R2681 Unsteadiness on feet: Secondary | ICD-10-CM

## 2021-10-13 DIAGNOSIS — M6281 Muscle weakness (generalized): Secondary | ICD-10-CM | POA: Diagnosis not present

## 2021-10-13 DIAGNOSIS — Z9181 History of falling: Secondary | ICD-10-CM | POA: Diagnosis not present

## 2021-10-13 DIAGNOSIS — R29818 Other symptoms and signs involving the nervous system: Secondary | ICD-10-CM

## 2021-10-13 DIAGNOSIS — R41841 Cognitive communication deficit: Secondary | ICD-10-CM | POA: Diagnosis not present

## 2021-10-13 DIAGNOSIS — M25621 Stiffness of right elbow, not elsewhere classified: Secondary | ICD-10-CM | POA: Diagnosis not present

## 2021-10-13 DIAGNOSIS — R278 Other lack of coordination: Secondary | ICD-10-CM | POA: Diagnosis not present

## 2021-10-13 DIAGNOSIS — R41842 Visuospatial deficit: Secondary | ICD-10-CM | POA: Diagnosis not present

## 2021-10-13 DIAGNOSIS — R2689 Other abnormalities of gait and mobility: Secondary | ICD-10-CM | POA: Diagnosis not present

## 2021-10-13 DIAGNOSIS — R293 Abnormal posture: Secondary | ICD-10-CM

## 2021-10-13 DIAGNOSIS — R41840 Attention and concentration deficit: Secondary | ICD-10-CM

## 2021-10-13 NOTE — Patient Instructions (Addendum)
Swallow strategies: Sit down at your dining room table for ALL your meals Take one bite a time - lay down down fork in between/cut food into small bites  Fill fork/spoon only about 1/2 way Focus on what you are doing! Eliminate distractions in your environment     Personal goal for Thursday:  Sort and organize the papers on your kitchen

## 2021-10-13 NOTE — Patient Instructions (Signed)
Visit Information  Thank you for taking time to visit with me today. Please don't hesitate to contact me if I can be of assistance to you before our next scheduled telephone appointment.  Following are the goals we discussed today:  Take all medications as prescribed Attend all scheduled provider appointments Call pharmacy for medication refills 3-7 days in advance of running out of medications Perform all self care activities independently  Perform IADL's (shopping, preparing meals, housekeeping, managing finances) independently Call provider office for new concerns or questions  drink 6 to 8 glasses of water each day manage portion size check blood pressure 3 times per week choose a place to take my blood pressure (home, clinic or office, retail store) call doctor for signs and symptoms of high blood pressure take medications for blood pressure exactly as prescribed report new symptoms to your doctor Follow the fall prevention safety tips as discussed Follow the aspiration prevention techniques as discussed   Our next appointment is by telephone on 11/19/21 at 10:30 AM  Please call the care guide team at [phone] if you need to cancel or reschedule your appointment.   If you are experiencing a Mental Health or [place] or need someone to talk to, please call [phone]-TALK (toll free, 24 hour hotline)   Patient verbalizes understanding of instructions and care plan provided today and agrees to view in [place]. Active MyChart status and patient understanding of how to access instructions and care plan via MyChart confirmed with patient.     Barb Merino, RN, BSN, CCM Care Management Coordinator [facility]  Direct Phone: [phone]

## 2021-10-13 NOTE — Therapy (Signed)
OUTPATIENT PHYSICAL THERAPY NEURO TREATMENT   Patient Name: April Larson MRN: [id number] DOB:12-12-1950, 71 y.o., female Today's Date: 10/13/2021   PCP: Glendale Chard, MD REFERRING PROVIDER: [name], MD    PT End of Session - 10/13/21 1030     Visit Number 2    Number of Visits 17   Plus eval   Date for PT Re-Evaluation 12/10/21    Authorization Type [employer]    Progress Note Due on Visit 10    PT Start Time 1029   Pt arrived late   PT Stop Time 1102    PT Time Calculation (min) 33 min    Activity Tolerance Patient tolerated treatment well    Behavior During Therapy WFL for tasks assessed/performed              Past Medical History:  Diagnosis Date   Diabetes mellitus without complication ([place])    Hypertension    Substance abuse ([place])    Past Surgical History:  Procedure Laterality Date   EYE SURGERY     Patient Active Problem List   Diagnosis Date Noted   Decreased dorsalis pedis pulse 11/17/2020   Tachycardia 08/11/2020   Cough 08/11/2020   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   Pure hypercholesterolemia 01/23/2020   Vitamin D deficiency 10/23/2019   Right hip pain 05/15/2018   Estrogen deficiency 05/15/2018   Class 1 obesity due to excess calories with serious comorbidity and body mass index (BMI) of 31.0 to 31.9 in adult 05/15/2018   Diabetes mellitus with stage 2 chronic kidney disease ([place]) 01/11/2018   Benign hypertension with chronic kidney disease, stage II 01/11/2018    ONSET DATE: 08/25/2021 (referral)  REFERRING DIAG: R47.1 (ICD-10-CM) - Dysarthria and anarthria R41.841 (ICD-10-CM) - Cognitive communication deficit R13.10 (ICD-10-CM) - Dysphagia, unspecified type R27.8 (ICD-10-CM) - Other lack of coordination R26.81 (ICD-10-CM) - Unsteadiness on feet R29.3 (ICD-10-CM) - Abnormal posture G20 (ICD-10-CM) - Primary parkinsonism ([place])   THERAPY DIAG:  Unsteadiness on feet  Other lack of coordination  Abnormal  posture  Rationale for Evaluation and Treatment Rehabilitation  SUBJECTIVE:                                                                                                                                                                                              SUBJECTIVE STATEMENT: Pt reports having good weekend, did not do much. No new falls.    PERTINENT HISTORY: HTN, DM, high chol, CKD, Supranuclear Palsy   PAIN:  Are you having pain? No  PRECAUTIONS: Fall  PATIENT GOALS "I wanna work on walking"   OBJECTIVE:  COGNITION: Overall cognitive status: No family/caregiver present to determine baseline cognitive functioning   TODAY'S TREATMENT:  NMR   [facility] PT Assessment - 10/13/21 1034       Mini-BESTest   Sit To Stand Normal: Comes to stand without use of hands and stabilizes independently.    Rise to Toes Moderate: Heels up, but not full range (smaller than when holding hands), OR noticeable instability for 3 s.    Stand on one leg (left) Moderate: < 20 s   2.78s   Stand on one leg (right) Moderate: < 20 s   2.91s   Stand on one leg - lowest score 1    Compensatory Stepping Correction - Forward Moderate: More than one step is required to recover equilibrium    Compensatory Stepping Correction - Backward Moderate: More than one step is required to recover equilibrium   Multiple small steps   Compensatory Stepping Correction - Left Lateral Moderate: Several steps to recover equilibrium    Compensatory Stepping Correction - Right Lateral Severe:  Falls, or cannot step    Stepping Corredtion Lateral - lowest score 0    Stance - Feet together, eyes open, firm surface  Normal: 30s    Stance - Feet together, eyes closed, foam surface  Moderate: < 30s   Pt did not maintain EC and braced against // bars, held for ~10s w/EC and without external support   Incline - Eyes Closed Moderate: Stands independently < 30s OR aligns with surface   Pt opened eyes after 19s   Change in Gait  Speed Normal: Significantly changes walkling speed without imbalance    Walk with head turns - Horizontal Moderate: performs head turns with reduction in gait speed.    Walk with pivot turns Moderate:Turns with feet close SLOW (>4 steps) with good balance.    Step over obstacles Moderate: Steps over box but touches box OR displays cautious behavior by slowing gait.    Timed UP & GO with Dual Task Moderate: Dual Task affects either counting OR walking (>10%) when compared to the TUG without Dual Task.    Mini-BEST total score 16            Reviewed HEP from previous bout of therapy and updated/demonstrated for improved transfers, stepping strategy and thoracic rotation:   - Side Stepping with Counter Support, down counter and back x4. Mod-max multimodal cues for increased step length, clearance and amplitude (Stomp your foot). Noted steps became hypokinetic w/added repetition despite cues   - Sit to stand with red band pull-apart, x10 reps w/min verbal cues to maintain proper sit <>stand form, as pt tends to brace against mat to counteract retropulsion.    - Plank with Thoracic Rotation on Counter, x10 reps per side. Mod multimodal cues for proper sequencing and form. Pt responds well to visual cues. Noted significant difficulty performing on R side due to forgetting to reach to contralateral side prior to reaching up.    PATIENT EDUCATION: Education details: MiniBest results, initial HEP, importance of arriving on time to therapy  Person educated: Patient Education method: Explanation and Demonstration Education comprehension: verbalized understanding and needs further education   HOME EXERCISE PROGRAM: Access Code: [email or web address] URL: [email or web address] Date: 10/13/2021 Prepared by: Mickie Bail Reilyn Nelson  Exercises - Side Stepping with Counter Support  - 2 x daily - 5 x weekly - 1 sets - 10 reps - Sit to stand with red band pull-apart   - 1 x daily - 7 x weekly - 3  sets - 10  reps - 3-5 second  hold - Plank with Thoracic Rotation on Counter  - 1 x daily - 7 x weekly - 3 sets - 10 reps    GOALS: Goals reviewed with patient? Yes  SHORT TERM GOALS: Target date: 11/05/2021  Pt will be independent with initial HEP for improved strength, balance, transfers and gait.  Baseline: Has HEP from previous bout of therapy, needs to be reviewed  Goal status: INITIAL  2.  Pt will verbalize and demonstrate fall prevention strategies in the home and community for reduced fall risk and frequency Baseline:  Goal status: INITIAL  3.  Pt will improve cog TUG to less than or equal to 11 seconds for improved functional mobility and decreased fall risk.  Baseline: 13.25s  Goal status: INITIAL  4.  Pt will improve 5 x STS to less than or equal to 13 seconds without UE support and absent retropulsion to demonstrate improved functional strength and transfer efficiency.   Baseline: 16.15s w/retropulsion  Goal status: INITIAL  LONG TERM GOALS: Target date: 12/03/2021  Pt will be independent with final HEP for improved strength, balance, transfers and gait.  Baseline:  Goal status: INITIAL  2.  Pt will verbalize understanding of local PD community resources, including fitness post DC.   Baseline:  Goal status: INITIAL  3.  Pt will improve MiniBest to 21/28 for decreased fall risk and improvement with compensatory stepping strategies.   Baseline: 16/28 Goal status: INITIAL   ASSESSMENT:  CLINICAL IMPRESSION: Session limited due to pt arriving late. Emphasis of skilled PT session on completing MiniBest and reviewing HEP from previous bout of therapy. Pt scored a 16/28 on MiniBest, lower than her baseline score of 22/28 during previous bout of therapy. Reviewed and updated HEP from previous bout of therapy, as pt has not been performing. Continue POC.    PLAN: PT FREQUENCY: 2x/week  PT DURATION: 8 weeks  PLANNED INTERVENTIONS: Therapeutic exercises, Therapeutic  activity, Neuromuscular re-education, Balance training, Gait training, Patient/Family education, Stair training, DME instructions, Manual therapy, and Re-evaluation  PLAN FOR NEXT SESSION: Finish MiniBest (could not promote stepping strategies), review previous HEP and update, sit <>stands, fall prevention strategies    Tishanna Dunford E Amber Williard, PT, DPT 10/13/2021, 11:03 AM

## 2021-10-13 NOTE — Therapy (Signed)
OUTPATIENT SPEECH LANGUAGE PATHOLOGY TREATMENT NOTE   Patient Name: April Larson MRN: [id number] DOB:10/06/1950, 71 y.o., female Today's Date: 10/13/2021  PCP: Glendale Chard, MD REFERRING PROVIDER: [name], MD   END OF SESSION:   End of Session - 10/13/21 1150     Visit Number 2    Number of Visits 17    Date for SLP Re-Evaluation 12/04/21    Authorization Type [employer] Medicare    Authorization Time Period $20 co pay per day    SLP Start Time 1150    SLP Stop Time  1230    SLP Time Calculation (min) 40 min    Activity Tolerance Patient tolerated treatment well             Past Medical History:  Diagnosis Date   Diabetes mellitus without complication ([place])    Hypertension    Substance abuse ([place])    Past Surgical History:  Procedure Laterality Date   EYE SURGERY     Patient Active Problem List   Diagnosis Date Noted   Decreased dorsalis pedis pulse 11/17/2020   Tachycardia 08/11/2020   Cough 08/11/2020   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   Pure hypercholesterolemia 01/23/2020   Vitamin D deficiency 10/23/2019   Right hip pain 05/15/2018   Estrogen deficiency 05/15/2018   Class 1 obesity due to excess calories with serious comorbidity and body mass index (BMI) of 31.0 to 31.9 in adult 05/15/2018   Diabetes mellitus with stage 2 chronic kidney disease ([place]) 01/11/2018   Benign hypertension with chronic kidney disease, stage II 01/11/2018    ONSET DATE: PD dx 2021; 08/25/2021 (referral date)  REFERRING DIAG: R47.1ICD-10-CM Dysarthria and anarthria  R41.841ICD-10-CM Cognitive communication deficit  R13.10ICD-10-CM Dysphagia, unspecified type   THERAPY DIAG:  Cognitive communication deficit  Dysarthria and anarthria  Dysphagia, unspecified type  Rationale for Evaluation and Treatment Rehabilitation  SUBJECTIVE: ***  PAIN:  Are you having pain? No   OBJECTIVE:  TODAY'S TREATMENT:  10-13-21:   10-08-21: Educated patient on  evaluation results and clinical observations. Pt verbalized understanding and agreement with initiation of ST POC to address cognitive linguistic changes, dysarthria, and possibly dysphagia.    PATIENT EDUCATION: Education details: see above Person educated: Patient Education method: Customer service manager Education comprehension: verbalized understanding, returned demonstration, and needs further education     GOALS: Goals reviewed with patient? Yes   SHORT TERM GOALS: Target date: 11/05/2021    Pt will complete clinical swallow assessment in first 1-2 ST sessions and pt will complete MBSS as needed for additional assessment per SLP recommendation  Baseline: Goal status: ongoing   2.  Pt will utilize dysarthria compensations to optimize vocal intensity and clarity in short structured conversations given occasional mod A over 2 sessions Baseline:  Goal status: ongoing   3.  Pt will utilize word retrieval strategies to aid anomia/dysnomia on structured speech tasks given occasional mod A over 2 sessions Baseline:  Goal status: ongoing   4.  Pt will implement attention/processing strategies in short structured conversations to aid patient engagement given occasional mod A over 2 sessions  Baseline:  Goal status: ongoing   5.  Pt will implement memory compensations to optimize performance at work and home given occasional mod A over 2 sessions Baseline:  Goal status: ongoing   6.  Family will utilize techniques to aid patient processing speed and comprehension given occasional min A over 2 sessions  Baseline:  Goal status: ongoing    LONG TERM GOALS:  Target date: 12/04/2021     Pt will implement attention/processing strategies in 10-15 minute conversations to aid patient engagement given occasional min A over 2 sessions  Baseline:  Goal status: ongoing   2.  Pt will demonstrate ability to ID and correct/compensate for anomia/dysnomia in 10-15 minute conversations given  occasional min A over 2 sessions Baseline:  Goal status: ongoing   3.  Pt will implement memory compensations to optimize performance at work and home given occasional min A over 2 sessions Baseline:  Goal status: ongoing   4.  Family will utilize techniques to aid patient processing speed and comprehension given rare min A over 2 sessions  Baseline:  Goal status: ongoing   5.  Pt will consume safest and least restrictive diet with reduced s/sx of aspiration with use of trained techniques as needed over 2 sessions Baseline:  Goal status: ongoing   ASSESSMENT:   CLINICAL IMPRESSION: Patient is a 71 y.o. 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 Pt arrived alone today. Endorsed slower processing overall. Pt endorsed changes in short term recall and attention but unable to provide specific examples even with SLP prompting and questioning cues. "Jumbled words" reported during recent PD screen, which reportedly causes patient to avoid conversations. Assessed word retrieval via BNT with intermittent phonemic/articulation errors exhibited, intermittent delayed processing noted, and occasional need for semantic/written cues. Overall, conversational volume averaged mid to upper 60s dB with intelligibility not affected in quiet environment. Pt endorsed difficulty swallowing solids as it "falls out front of mouth." Will plan to further assess next session due to time constraints. Given recent changes in cognition, communication, and swallowing, pt would benefit from skilled ST intervention to optimize functional independence and safety.    OBJECTIVE IMPAIRMENTS  Objective impairments include attention, memory, executive functioning, expressive language, dysarthria, and dysphagia. These impairments are limiting patient from household responsibilities, ADLs/IADLs, effectively communicating at home and in community, and safety when swallowing.Factors affecting potential to achieve goals and  functional outcome are ability to learn/carryover information, cooperation/participation level, medical prognosis, and previous level of function. Patient will benefit from skilled SLP services to address above impairments and improve overall function.   REHAB POTENTIAL: Fair - hx of reduced carryover of HEP   PLAN: SLP FREQUENCY: 2x/week   SLP DURATION: 8 weeks   PLANNED INTERVENTIONS: Language facilitation, Environmental controls, Cueing hierachy, Cognitive reorganization, Internal/external aids, Functional tasks, Multimodal communication approach, SLP instruction and feedback, Compensatory strategies, and Patient/family education     Marzetta Board, CCC-SLP 10/13/2021, 11:50 AM

## 2021-10-13 NOTE — Chronic Care Management (AMB) (Signed)
Chronic Care Management   CCM RN Visit Note  10/12/2021 Name: April Larson MRN: [id number] DOB: 04/23/1950  Subjective: April Larson is a 71 y.o. year old female who is a primary care patient of Glendale Chard, MD. The care management team was consulted for assistance with disease management and care coordination needs.    Engaged with patient by telephone for follow up visit in response to provider referral for case management and/or care coordination services.   Consent to Services:  The patient was given information about Chronic Care Management services, agreed to services, and gave verbal consent prior to initiation of services.  Please see initial visit note for detailed documentation.   Patient agreed to services and verbal consent obtained.   Assessment: Review of patient past medical history, allergies, medications, health status, including review of consultants reports, laboratory and other test data, was performed as part of comprehensive evaluation and provision of chronic care management services.   SDOH (Social Determinants of Health) assessments and interventions performed:  Yes, patient will benefit from home delivered meals, SW referral sent   CCM Care Plan  No Known Allergies  Outpatient Encounter Medications as of 10/12/2021  Medication Sig Note   ACCU-CHEK FASTCLIX LANCETS MISC USE TO CHECK BLOOD SUGARS TWICE DAILY    ACCU-CHEK GUIDE test strip USE TO CHECK BLOOD SUGARS TWICE DAILY AS DIRECTED    aspirin EC 81 MG tablet Take 1 tablet (81 mg total) by mouth daily.    atorvastatin (LIPITOR) 10 MG tablet Take 1 tablet (10 mg total) by mouth daily.    Biotin 10 MG CAPS Take 1 tablet by mouth daily.    Blood Glucose Monitoring Suppl (ACCU-CHEK GUIDE ME) w/Device KIT USE TO CHECK BLOOD GLUCOSE TWICE DAILY    calcium carbonate (OSCAL) 1500 (600 Ca) MG TABS tablet Take 1 tablet (1,500 mg total) by mouth daily with breakfast.     carbidopa-levodopa (SINEMET IR) 25-100 MG tablet TAKE ONE TABLET BY MOUTH four TIMES DAILY. FOLLOW titration instructions provided separately in writing    Cholecalciferol 125 MCG (5000 UT) TABS Take 1 tablet (5,000 Units total) by mouth daily.    diclofenac Sodium (VOLTAREN) 1 % GEL APPLY 2 GRAMS TOPICALLY TO THE AFFECTED AREA FOUR TIMES DAILY    fluticasone (FLONASE) 50 MCG/ACT nasal spray instill TWO SPRAYS into each nostril daily AS DIRECTED    Insulin Pen Needle (B-D ULTRAFINE III SHORT PEN) 31G X 8 MM MISC Use as directed    loratadine (CLARITIN) 10 MG tablet Take 1 tablet by mouth daily    metFORMIN (GLUCOPHAGE) 500 MG tablet Take 1 tablet (500 mg total) by mouth daily with breakfast. Dinner    Multiple Vitamins-Minerals (ONE-A-DAY WOMENS 50+ ADVANTAGE) TABS Take 1 tablet by mouth daily    NON FORMULARY 2 each. Calm 08/13/2020: Taking 2 at night before bed.    Omega-3 Fatty Acids (FISH OIL) 1000 MG CAPS Take 1 capsule by mouth daily (Patient not taking: Reported on 07/23/2021)    omeprazole (PRILOSEC) 40 MG capsule TAKE 1 CAPSULE BY MOUTH EVERY DAY BEFORE A MEAL    pioglitazone (ACTOS) 15 MG tablet Take 1 tablet (15 mg total) by mouth daily. Monday, wednesday, Friday    telmisartan-hydrochlorothiazide (MICARDIS HCT) 40-12.5 MG tablet Take 1 tablet by mouth daily.    Turmeric 500 MG CAPS Take 1 capsule by mouth 2 (two) times daily.    vitamin C (ASCORBIC ACID) 500 MG tablet Take 1 tablet (500 mg total)  by mouth daily.    XULTOPHY 100-3.6 UNIT-MG/ML SOPN INJECT 25 UNITS SUBCUTANEOUSLY DAILY    No facility-administered encounter medications on file as of 10/12/2021.    Patient Active Problem List   Diagnosis Date Noted   Decreased dorsalis pedis pulse 11/17/2020   Tachycardia 08/11/2020   Cough 08/11/2020   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   Pure hypercholesterolemia 01/23/2020   Vitamin D deficiency 10/23/2019   Right hip pain 05/15/2018   Estrogen deficiency 05/15/2018   Class 1  obesity due to excess calories with serious comorbidity and body mass index (BMI) of 31.0 to 31.9 in adult 05/15/2018   Diabetes mellitus with stage 2 chronic kidney disease ([place]) 01/11/2018   Benign hypertension with chronic kidney disease, stage II 01/11/2018    Conditions to be addressed/monitored: DM II with stage 2 CKD, Hypertensive Nephropathy, Pure Hypercholesterolemia, Atypical parkinsonism   Care Plan : RN Care Manager Plan of Care  Updates made by Lynne Logan, RN since 10/12/2021 12:00 AM     Problem: No Plan of Care Established for management of chronic disease states (DM II with stage 2 CKD, Hypertensive Nephropathy, Pure Hypercholesterolemia)   Priority: High     Long-Range Goal: Establishment of Plan of Care for Management of Chronic disease states (DM II with stage 2 CKD, Hypertensive Nephropathy, Pure Hypercholesterolemia)   Start Date: 04/10/2021  Expected End Date: 04/09/2022  Recent Progress: On track  Priority: High  Note:   Current Barriers:  Knowledge Deficits related to plan of care for management of DM II with stage 2 CKD, Hypertensive Nephropathy, Pure Hypercholesterolemia   Chronic Disease Management support and education needs related to DM II with stage 2 CKD, Hypertensive Nephropathy, Pure Hypercholesterolemia    RNCM Clinical Goal(s):  Patient will verbalize basic understanding of  DM II with stage 2 CKD, Hypertensive Nephropathy, Pure Hypercholesterolemia  disease process and self health management plan as evidenced by patient will report having no disease exacerbations related to her chronic disease states  take all medications exactly as prescribed and will call provider for medication related questions as evidenced by patient will report having no missed doses of her prescribed medications demonstrate Improved health management independence as evidenced by patient will report 100% adherence to her prescribed treatment plan  continue to work with RN Care  Manager to address care management and care coordination needs related to  DM II with stage 2 CKD, Hypertensive Nephropathy, Pure Hypercholesterolemia  as evidenced by adherence to CM Team Scheduled appointments demonstrate ongoing self health care management ability   as evidenced by    through collaboration with RN Care manager, provider, and care team.   Interventions: 1:1 collaboration with primary care provider regarding development and update of comprehensive plan of care as evidenced by provider attestation and co-signature Inter-disciplinary care team collaboration (see longitudinal plan of care) Evaluation of current treatment plan related to  self management and patient's adherence to plan as established by provider  Diabetes Interventions:  (Status:  Condition stable.  Not addressed this visit.) Long Term Goal Assessed patient's understanding of A1c goal: <6.5% Provided education to patient about basic DM disease process Reviewed medications with patient and discussed importance of medication adherence Review of patient status, including review of consultants reports, relevant laboratory and other test results, and medications completed Reinforced the importance of adherence to following a diabetic diet , using the plate method and portion control  Mailed printed educational materials related to Chair Exercises Lab Results  Component Value Date  HGBA1C 6.5 (H) 07/08/2021  Atypical Parkinsonism Interventions:  (Status:  Goal on track:  Yes.)  Long Term Goal Evaluation of current treatment plan related to  Atypical Parkinsonism , self-management and patient's adherence to plan as established by provider Reviewed and discussed patient follow up with Dr. Hassell Done, neuro-ophthalmologist with [facility] completed on 08/25/21 for evaluation of new symptoms regarding the loss of her peripheral vision  Reviewed the following Assessment/Plan with patient;  ASSESSMENT and  PLAN: ICD-10-CM  1. 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 NO visual field defect H53.40  Supranuclear palsy - patient with recent parkinson diagnosis - complaining of decreased side vision, but is describing an efferent problem (difficulty moving her eyes to the sides) rather than afferent problem (has normal visual fields). -Vertical gaze palsy is much greater than horizontal gaze palsies. - Dolls head maneuver normal in both horizontal and vertical plane. Our assessment today shows that the patient does not have any peripheral or visual field deficits, but is describing her frustration with her ocular motility. She has an inability to generate saccades in the vertical plane that is quite severe, less so in the horizontal plane. Totally normal extraocular motility with doll's head maneuver. 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 It looks as though she has had neuroimaging, but the other important item in the differential diagnosis would be a brainstem process such as ischemic or other. 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  As I discussed with the patient and her family member, there is no ocular solution to this problem, we cannot fit her with glasses or prescribed eyedrops to help with this issue since it originates in the brain rather than the eyes.  I hope this consultation has been helpful as her neurologist continues the investigation.  We have not made any additional consultative appointments for this patient on the neuro-ophthalmology service, and the patient will return to the care of the referring doctor. However, if the patient's clinical course is not as  expected, or new questions arise, then we can see the patient for a re-consultation as deemed necessary by the referring doctor.  We explained the diagnosis, plan, and importance of appropriate follow up with the patient and they expressed understanding. Ms. Benjamine Mola understands that she can call us with any questions about her evaluation, and understands the importance of calling her doctors with any change in symptoms. We will endeavor to keep her doctors informed of any additional visits or test results.     Determined patient verbalizes understanding of her prescribed treatment plan Educated patient on basic disease process related to her diagnosis Educated patient on risk for injury from falls and or aspirational pneumonia Educated patient on fall prevention and home safety including use of DME at all times, avoiding bending over, do not walk on uneven ground, wear good supportive shoes at all times, ask your PT/OT about getting a reacher, keep floors clutter free and use a night light  Instructed patient to notify her PCP of any/all falls and to seek medical treatment promptly for injuries Educated patient regarding signs/symptoms of aspiration and prevention such as cutting food into smaller pieces or  pureeing, always eat in an upright position, chew food thoroughly before swallowing, take your time when eating your meals/snacks, take your medications one pill at a time, report symptoms suggestive of aspiration to your doctor promptly  Determined patient is currently working with outpatient PT/OT and ST as directed by Neuro Assessed for SDOH barriers, determined patient will benefit from receiving home delivered meals, referral sent to embedded [place] for assistance  Discussed plans with patient for ongoing care management follow up and provided patient with direct contact information for care management team  Hyperlipidemia Interventions:  (Status:  Condition stable.  Not  addressed this visit.) Long Term Goal Evaluation of current treatment plan related to hyperlipidemia self management and patient's adherence to plan as established by provider Provider established cholesterol goals reviewed Counseled on importance of regular laboratory monitoring as prescribed Reviewed importance of limiting foods high in cholesterol Reviewed exercise goals and target of 150 minutes per week Discussed plans with patient for ongoing care management follow up and provided patient with direct contact information for care management team Lipid Panel     Component Value Date/Time   CHOL 176 08/11/2020 1450   TRIG 390 (H) 08/11/2020 1450   HDL 53 08/11/2020 1450   CHOLHDL 3.3 10/23/2019 1531   CHOLHDL 2.2 01/17/2009 0545   VLDL 15 01/17/2009 0545   LDLCALC 63 08/11/2020 1450   LABVLDL 60 (H) 08/11/2020 1450   Hypertension Interventions:  (Status:  Condition stable.  Not addressed this visit.) Long Term Goal Last practice recorded BP readings:  BP Readings from Last 3 Encounters:  07/23/21 122/62  07/08/21 120/68  04/09/21 (!) 145/79  Most recent eGFR/CrCl:  Lab Results  Component Value Date   EGFR 68 07/08/2021    No components found for: CRCL Evaluation of current treatment plan related to hypertension self management and patient's adherence to plan as established by provider Reviewed medications with patient and discussed importance of compliance Educated patient on the importance to take his medications exactly as prescribed without missed doses Instructed patient to call her provider for signs and symptoms of high blood pressure and or to report new symptoms or concerns Provided education on prescribed diet low Sodium  Patient Goals/Self-Care Activities: Take all medications as prescribed Attend all scheduled provider appointments Call pharmacy for medication refills 3-7 days in advance of running out of medications Perform all self care activities independently   Perform IADL's (shopping, preparing meals, housekeeping, managing finances) independently Call provider office for new concerns or questions  drink 6 to 8 glasses of water each day manage portion size check blood pressure 3 times per week choose a place to take my blood pressure (home, clinic or office, retail store) call doctor for signs and symptoms of high blood pressure take medications for blood pressure exactly as prescribed report new symptoms to your doctor Follow the fall prevention safety tips as discussed Follow the aspiration prevention techniques as discussed   Follow Up Plan:  Telephone follow up appointment with care management team member scheduled for:  11/19/21     Barb Merino, RN, BSN, CCM Care Management Coordinator [facility]  Direct Phone: [phone]

## 2021-10-14 ENCOUNTER — Telehealth: Payer: Self-pay

## 2021-10-14 ENCOUNTER — Telehealth: Payer: Medicare HMO

## 2021-10-14 DIAGNOSIS — M2012 Hallux valgus (acquired), left foot: Secondary | ICD-10-CM | POA: Diagnosis not present

## 2021-10-14 DIAGNOSIS — B353 Tinea pedis: Secondary | ICD-10-CM | POA: Diagnosis not present

## 2021-10-14 DIAGNOSIS — M2011 Hallux valgus (acquired), right foot: Secondary | ICD-10-CM | POA: Diagnosis not present

## 2021-10-14 DIAGNOSIS — B351 Tinea unguium: Secondary | ICD-10-CM | POA: Diagnosis not present

## 2021-10-14 DIAGNOSIS — W57XXXA Bitten or stung by nonvenomous insect and other nonvenomous arthropods, initial encounter: Secondary | ICD-10-CM | POA: Diagnosis not present

## 2021-10-14 DIAGNOSIS — E1142 Type 2 diabetes mellitus with diabetic polyneuropathy: Secondary | ICD-10-CM | POA: Diagnosis not present

## 2021-10-14 DIAGNOSIS — I70203 Unspecified atherosclerosis of native arteries of extremities, bilateral legs: Secondary | ICD-10-CM | POA: Diagnosis not present

## 2021-10-14 DIAGNOSIS — S80862A Insect bite (nonvenomous), left lower leg, initial encounter: Secondary | ICD-10-CM | POA: Diagnosis not present

## 2021-10-14 NOTE — Telephone Encounter (Signed)
  Care Management   Follow Up Note   10/14/2021 Name: April Larson MRN: [id number] DOB: 04-21-1950   Referred by: Glendale Chard, MD Reason for referral : Chronic Care Management (Unsuccessful call)   An unsuccessful telephone outreach was attempted today. The patient was referred to the case management team for assistance with care management and care coordination.   Follow Up Plan: A HIPPA compliant phone message was left for the patient providing contact information and requesting a return call.   Daneen Schick, BSW, CDP Social Worker, Certified Dementia Practitioner [place] [facility] [phone]

## 2021-10-15 ENCOUNTER — Encounter: Payer: Self-pay | Admitting: Physical Therapy

## 2021-10-15 ENCOUNTER — Ambulatory Visit: Payer: Medicare HMO | Admitting: Physical Therapy

## 2021-10-15 ENCOUNTER — Ambulatory Visit: Payer: Medicare HMO

## 2021-10-15 ENCOUNTER — Ambulatory Visit: Payer: Medicare HMO | Admitting: Occupational Therapy

## 2021-10-15 DIAGNOSIS — R293 Abnormal posture: Secondary | ICD-10-CM | POA: Diagnosis not present

## 2021-10-15 DIAGNOSIS — R41840 Attention and concentration deficit: Secondary | ICD-10-CM

## 2021-10-15 DIAGNOSIS — M6281 Muscle weakness (generalized): Secondary | ICD-10-CM | POA: Diagnosis not present

## 2021-10-15 DIAGNOSIS — R2681 Unsteadiness on feet: Secondary | ICD-10-CM

## 2021-10-15 DIAGNOSIS — R29818 Other symptoms and signs involving the nervous system: Secondary | ICD-10-CM | POA: Diagnosis not present

## 2021-10-15 DIAGNOSIS — R278 Other lack of coordination: Secondary | ICD-10-CM

## 2021-10-15 DIAGNOSIS — R41841 Cognitive communication deficit: Secondary | ICD-10-CM

## 2021-10-15 DIAGNOSIS — R471 Dysarthria and anarthria: Secondary | ICD-10-CM | POA: Diagnosis not present

## 2021-10-15 DIAGNOSIS — M25621 Stiffness of right elbow, not elsewhere classified: Secondary | ICD-10-CM | POA: Diagnosis not present

## 2021-10-15 DIAGNOSIS — R1310 Dysphagia, unspecified: Secondary | ICD-10-CM | POA: Diagnosis not present

## 2021-10-15 DIAGNOSIS — R41842 Visuospatial deficit: Secondary | ICD-10-CM

## 2021-10-15 DIAGNOSIS — Z9181 History of falling: Secondary | ICD-10-CM | POA: Diagnosis not present

## 2021-10-15 DIAGNOSIS — R2689 Other abnormalities of gait and mobility: Secondary | ICD-10-CM | POA: Diagnosis not present

## 2021-10-15 NOTE — Patient Instructions (Signed)
"  Think before you act"  You need to think about your next step before you do it  Example: I need to carry multiple things to the house. Do I need to carry everything at once or make a few trips?  If you don't understand or losing focus while talking, let your listener know! You can say:  Tell me again Can you say it a different way? I'm sorry I lost focus I don't understand what you meant

## 2021-10-15 NOTE — Patient Instructions (Signed)
1. Look for the edge of objects (to the left and/or right) so that you make sure you are seeing all of an object 2. Turn your head when walking, scan from side to side, particularly in busy environments 3. Use an organized scanning pattern. It's usually easier to scan from top to bottom, and left to right (like you are reading) 4. Double check yourself 5. Use a line guide (like a blank piece of paper) or your finger when reading 6. If necessary, place brightly colored tape at end of table or work area as a reminder to always look until you see the tape.  7. I do not recommend that you drive due to the severity of your visual deficits, and risk for injury

## 2021-10-15 NOTE — Therapy (Signed)
OUTPATIENT PHYSICAL THERAPY NEURO TREATMENT   Patient Name: April Larson MRN: [id number] DOB:19-Jul-1950, 71 y.o., female Today's Date: 10/15/2021   PCP: Glendale Chard, MD REFERRING PROVIDER: [name], MD    PT End of Session - 10/15/21 1402     Visit Number 3    Number of Visits 17   Plus eval   Date for PT Re-Evaluation 12/10/21    Authorization Type [employer] Medicare    Progress Note Due on Visit 10    PT Start Time 1400    PT Stop Time 1444    PT Time Calculation (min) 44 min    Activity Tolerance Patient tolerated treatment well    Behavior During Therapy WFL for tasks assessed/performed              Past Medical History:  Diagnosis Date   Diabetes mellitus without complication ([place])    Hypertension    Substance abuse ([place])    Past Surgical History:  Procedure Laterality Date   EYE SURGERY     Patient Active Problem List   Diagnosis Date Noted   Decreased dorsalis pedis pulse 11/17/2020   Tachycardia 08/11/2020   Cough 08/11/2020   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   Pure hypercholesterolemia 01/23/2020   Vitamin D deficiency 10/23/2019   Right hip pain 05/15/2018   Estrogen deficiency 05/15/2018   Class 1 obesity due to excess calories with serious comorbidity and body mass index (BMI) of 31.0 to 31.9 in adult 05/15/2018   Diabetes mellitus with stage 2 chronic kidney disease ([place]) 01/11/2018   Benign hypertension with chronic kidney disease, stage II 01/11/2018    ONSET DATE: 08/25/2021 (referral)  REFERRING DIAG: R47.1 (ICD-10-CM) - Dysarthria and anarthria R41.841 (ICD-10-CM) - Cognitive communication deficit R13.10 (ICD-10-CM) - Dysphagia, unspecified type R27.8 (ICD-10-CM) - Other lack of coordination R26.81 (ICD-10-CM) - Unsteadiness on feet R29.3 (ICD-10-CM) - Abnormal posture G20 (ICD-10-CM) - Primary parkinsonism ([place])   THERAPY DIAG:  Abnormal posture  Unsteadiness on feet  Other symptoms and signs involving the  nervous system  Rationale for Evaluation and Treatment Rehabilitation  SUBJECTIVE:                                                                                                                                                                                              SUBJECTIVE STATEMENT: Had a fall over the weekend, was carrying too many things when getting out of the car. Unsure exactly how she fell. Was able to get up on her own.    PERTINENT HISTORY: HTN, DM, high chol, CKD, Supranuclear Palsy   PAIN:  Are you having  pain? No  PRECAUTIONS: Fall  PATIENT GOALS "I wanna work on walking"   OBJECTIVE:   COGNITION: Overall cognitive status: No family/caregiver present to determine baseline cognitive functioning   TODAY'S TREATMENT:  NMR  Massed practice of sit <> stands - working on scooting out towards edge, wider BOS, tucking feet under her and incr forward lean to stand. Focused on big movement when leaning forwards to prevent retropulsion. Needing reminder cues throughout session and to focus on one task at a time and to take her time when performing.  Performed x10 reps from mat table without UE support, x10 reps from lower chair (pt extending arms out forwards to help with incr forward lean from a lower surface), x10 reps from a 14" box with a thick piece of foam to simulate getting on and off of her couch with use of arm rest on chair to simulate arm rest. Focus on incr forward lean and taking her time as when pt rushes that is when she tends to have retropulsion. Cued to reach back to help with pt lowering slowly to surface for controlled descent. Printed out handout of these instructions for carryover for home.  Alternating posterior stepping strategy with BUE support x10 reps each side, cues for wider BOS and weight shifting.   Access Code: [email or web address] URL: [email or web address] Date: 10/15/2021 Prepared by: Janann August  Reviewed bolded exercises and  added wide BOS with reaching to HEP.   Exercises - Side Stepping with Counter Support  - 2 x daily - 5 x weekly - 1 sets - 10 reps - performed as lateral step and weight shift, cues for incr intensity when picking up foot when stepping, esp with RLE - Sit to stand with band pull-apart   - 1 x daily - 7 x weekly - 3 sets - 10 reps - 3-5 second  hold - Plank with Thoracic Rotation on Counter  - 1 x daily - 7 x weekly - 3 sets - 10 reps - cued to look at hands and for full ROM, cued to open up hands - Side to Side Weight Shift with Overhead Reach and Counter Support  - 1 x daily - 5 x weekly - 1-2 sets - 10 reps - cued for wider BOS and opening up hands, esp RUE when reaching and extending through elbow   Self-Care Discussed fall prevention in the home - importance of good lighting, grab bars (pt has still not had this installed), having pt's family members get rid of pt's throw rugs to decr fall risk. Will provide handout to go over with family in future session, asked pt to see if family can come back at next session for education. Discussed making sure that pt is carrying only one thing at a time when going inside and taking multiple trips as needed due to pt's recent fall.   Pt reports that she has to go outside and take her dog out and has to hold her 10 lb. Dog and go up and down 4 steps and hold on with single handrail (pt's dog can't walk), discussed that someone else will have to do this task for her for safety and to decr pt's risk of falls. Discussed if pt has to do this to make sure she takes one step at a time, go slowly, and to have a wide BOS/staggered stance and to use her legs to help bend down.    PATIENT EDUCATION: Education details: Reviewed HEP, lateral wide BOS reaching addition to  HEP, sit <> stand training, see Self Care.  Person educated: Patient Education method: Explanation, Demonstration, and Handouts Education comprehension: verbalized understanding and needs further  education   HOME EXERCISE PROGRAM: Access Code: [email or web address]     GOALS: Goals reviewed with patient? Yes  SHORT TERM GOALS: Target date: 11/05/2021  Pt will be independent with initial HEP for improved strength, balance, transfers and gait.  Baseline: Has HEP from previous bout of therapy, needs to be reviewed  Goal status: INITIAL  2.  Pt will verbalize and demonstrate fall prevention strategies in the home and community for reduced fall risk and frequency Baseline:  Goal status: INITIAL  3.  Pt will improve cog TUG to less than or equal to 11 seconds for improved functional mobility and decreased fall risk.  Baseline: 13.25s  Goal status: INITIAL  4.  Pt will improve 5 x STS to less than or equal to 13 seconds without UE support and absent retropulsion to demonstrate improved functional strength and transfer efficiency.   Baseline: 16.15s w/retropulsion  Goal status: INITIAL  LONG TERM GOALS: Target date: 12/03/2021  Pt will be independent with final HEP for improved strength, balance, transfers and gait.  Baseline:  Goal status: INITIAL  2.  Pt will verbalize understanding of local PD community resources, including fitness post DC.   Baseline:  Goal status: INITIAL  3.  Pt will improve MiniBest to 21/28 for decreased fall risk and improvement with compensatory stepping strategies.   Baseline: 16/28 Goal status: INITIAL   ASSESSMENT:  CLINICAL IMPRESSION: Today's skilled session focused on massed practice of sit <> stands from various surfaces and heights to simulate at home. Heavy focus on proper technique of scooting towards edge and making sure that pt performs with a big lean to prevent retropulsion and bracing BLE against mat table. Pt did well with this when she took her time and with proper set up.Reviewed fall prevention in the home and will need further review when pt's family comes in for education. Will continue to progress towards LTGs.    PLAN: PT  FREQUENCY: 2x/week  PT DURATION: 8 weeks  PLANNED INTERVENTIONS: Therapeutic exercises, Therapeutic activity, Neuromuscular re-education, Balance training, Gait training, Patient/Family education, Stair training, DME instructions, Manual therapy, and Re-evaluation  PLAN FOR NEXT SESSION: sit <>stands, fall prevention strategies. Visual scanning. Stepping strategies.    Arliss Journey, PT, DPT 10/15/2021, 4:27 PM

## 2021-10-15 NOTE — Therapy (Signed)
OUTPATIENT SPEECH LANGUAGE PATHOLOGY TREATMENT NOTE   Patient Name: April Larson MRN: [id number] DOB:10/04/1950, 71 y.o., female Today's Date: 10/15/2021  PCP: Glendale Chard, MD REFERRING PROVIDER: [name], MD   END OF SESSION:   End of Session - 10/15/21 1315     Visit Number 3    Number of Visits 17    Date for SLP Re-Evaluation 12/04/21    Authorization Type Aetna Medicare    Authorization Time Period $20 co pay per day    SLP Start Time 1318    SLP Stop Time  1400    SLP Time Calculation (min) 42 min    Activity Tolerance Patient tolerated treatment well             Past Medical History:  Diagnosis Date   Diabetes mellitus without complication ([place])    Hypertension    Substance abuse ([place])    Past Surgical History:  Procedure Laterality Date   EYE SURGERY     Patient Active Problem List   Diagnosis Date Noted   Decreased dorsalis pedis pulse 11/17/2020   Tachycardia 08/11/2020   Cough 08/11/2020   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   Pure hypercholesterolemia 01/23/2020   Vitamin D deficiency 10/23/2019   Right hip pain 05/15/2018   Estrogen deficiency 05/15/2018   Class 1 obesity due to excess calories with serious comorbidity and body mass index (BMI) of 31.0 to 31.9 in adult 05/15/2018   Diabetes mellitus with stage 2 chronic kidney disease ([place]) 01/11/2018   Benign hypertension with chronic kidney disease, stage II 01/11/2018    ONSET DATE: PD dx 2021; 08/25/2021 (referral date)  REFERRING DIAG: R47.1ICD-10-CM Dysarthria and anarthria  R41.841ICD-10-CM Cognitive communication deficit  R13.10ICD-10-CM Dysphagia, unspecified type   THERAPY DIAG:  Cognitive communication deficit  Dysarthria and anarthria  Rationale for Evaluation and Treatment Rehabilitation  SUBJECTIVE: "I did my homework"  PAIN:  Are you having pain? No   OBJECTIVE:  TODAY'S TREATMENT:  10-15-21: Pt reportedly completed personal goal to organize  dining room table in order to eat there at mealtimes for increased safety. Improved swallow function reported with use of recommended techniques (see previous note). Pt reported a fall following last therapy sessions while attempting to walk into her house holding items in both hands. Pt denied any cognitive or physical changes s/p fall despite endorsing hitting her head. SLP recommended ongoing monitoring in case sx arise. SLP educated and trained technique to aid processing and problem solving, with pt implementing intentional pause to process and plan prior to execution of task. Discussed importance of initiating this technique as thought processing has slowed and reduced safety awareness demonstrated. Targeted time management task, in which pt required usual repetition/rephrasing to optimize comprehension of auditory question x1/4. SLP recommended pt self-advocate if she is not understanding or attending in conversation, with example phrases provided to aid self-advocacy. No overt dysnomia or anomia exhibited in conversation today. Pt was noted to repeat therapist very frequently and attempt to fill in answers.   10-13-21: SLP targeted functional recall of therapeutic activities in PT/OT today, with good recall affirmed by sister. Completed clinical swallow assessment of regular textures and thin liquids, in which pt exhibited cough x1 with seemingly consecutive sips of water. No other overt s/sx of aspiration exhibited. No anterior spillage noted. Good oral clearance and bolus manipulation exhibited. Overall swallow initiation appeared timely. Pt stated "wow, I'm not coughing as much" which sister correlated to less distracting environment. SLP educated and instructed recommended aspiration  precautions, including small bites, slow rate, and eliminating environmental distractions. Pt reported standing during most meals due to being "rushed", in which SLP recommended patient sit at dining room table. Will plan  to target time management in upcoming ST sessions. Discussed cognitive strategies and tasks to organize environment at home due to difficulty recalling locations of papers secondary to poor organization. SLP recommended patient receive support from family due to reduced motivation endorsed. Utilized personal goal setting to aid patient motivation and carryover of recommendations.   10-08-21: Educated patient on evaluation results and clinical observations. Pt verbalized understanding and agreement with initiation of ST POC to address cognitive linguistic changes, dysarthria, and possibly dysphagia.    PATIENT EDUCATION: Education details: see above Person educated: Patient Education method: Customer service manager Education comprehension: verbalized understanding, returned demonstration, and needs further education     GOALS: Goals reviewed with patient? Yes   SHORT TERM GOALS: Target date: 11/05/2021    Pt will complete clinical swallow assessment in first 1-2 ST sessions and pt will complete MBSS as needed for additional assessment per SLP recommendation  Baseline: Goal status: Met (consider future MBSS referral pending implementation of swallow precautions)   2.  Pt will utilize dysarthria compensations to optimize vocal intensity and clarity in short structured conversations given occasional mod A over 2 sessions Baseline:  Goal status: ongoing   3.  Pt will utilize word retrieval strategies to aid anomia/dysnomia on structured speech tasks given occasional mod A over 2 sessions Baseline:  Goal status: ongoing   4.  Pt will implement attention/processing strategies in short structured conversations to aid patient engagement given occasional mod A over 2 sessions  Baseline:  Goal status: ongoing   5.  Pt will implement memory compensations to optimize performance at work and home given occasional mod A over 2 sessions Baseline:  Goal status: ongoing   6.  Family will utilize  techniques to aid patient processing speed and comprehension given occasional min A over 2 sessions  Baseline:  Goal status: ongoing    LONG TERM GOALS: Target date: 12/04/2021     Pt will implement attention/processing strategies in 10-15 minute conversations to aid patient engagement given occasional min A over 2 sessions  Baseline:  Goal status: ongoing   2.  Pt will demonstrate ability to ID and correct/compensate for anomia/dysnomia in 10-15 minute conversations given occasional min A over 2 sessions Baseline:  Goal status: ongoing   3.  Pt will implement memory compensations to optimize performance at work and home given occasional min A over 2 sessions Baseline:  Goal status: ongoing   4.  Family will utilize techniques to aid patient processing speed and comprehension given rare min A over 2 sessions  Baseline:  Goal status: ongoing   5.  Pt will consume safest and least restrictive diet with reduced s/sx of aspiration with use of trained techniques as needed over 2 sessions Baseline:  Goal status: ongoing   ASSESSMENT:   CLINICAL IMPRESSION: Patient is a 71 y.o. 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 Re-educated aspiration precautions to implement at home to optimize safety, with benefit endorsed, and SLP will continue to monitor to determine if further objective assessment warranted. Targeted cognitive strategies to implement at home to aid cognitive functioning (processing, recall, attention) with support from family requested to aid carryover. Given recent changes in cognition, communication, and swallowing, pt would benefit from skilled ST intervention to optimize functional independence and safety.    OBJECTIVE  IMPAIRMENTS  Objective impairments include attention, memory, executive functioning, expressive language, dysarthria, and dysphagia. These impairments are limiting patient from household responsibilities, ADLs/IADLs, effectively communicating  at home and in community, and safety when swallowing.Factors affecting potential to achieve goals and functional outcome are ability to learn/carryover information, cooperation/participation level, medical prognosis, and previous level of function. Patient will benefit from skilled SLP services to address above impairments and improve overall function.   REHAB POTENTIAL: Fair - hx of reduced carryover of HEP   PLAN: SLP FREQUENCY: 2x/week   SLP DURATION: 8 weeks   PLANNED INTERVENTIONS: Language facilitation, Environmental controls, Cueing hierachy, Cognitive reorganization, Internal/external aids, Functional tasks, Multimodal communication approach, SLP instruction and feedback, Compensatory strategies, and Patient/family education     Marzetta Board, CCC-SLP 10/15/2021, 2:06 PM

## 2021-10-15 NOTE — Therapy (Signed)
OUTPATIENT OCCUPATIONAL THERAPY PARKINSON'S EVALUATION  Patient Name: April Larson MRN: [id number] DOB:07-Jul-1950, 71 y.o., female Today's Date: 10/15/2021  PCP: Glendale Chard, MD REFERRING PROVIDER: [name], MD    OT End of Session - 10/15/21 1512     Visit Number 3    Number of Visits 17    Date for OT Re-Evaluation 12/03/21    Authorization Type [employer] MCR    Authorization - Visit Number 3    Progress Note Due on Visit 10    OT Start Time 1450    OT Stop Time 1530    OT Time Calculation (min) 40 min    Activity Tolerance Patient tolerated treatment well    Behavior During Therapy WFL for tasks assessed/performed              Past Medical History:  Diagnosis Date   Diabetes mellitus without complication ([place])    Hypertension    Substance abuse ([place])    Past Surgical History:  Procedure Laterality Date   EYE SURGERY     Patient Active Problem List   Diagnosis Date Noted   Decreased dorsalis pedis pulse 11/17/2020   Tachycardia 08/11/2020   Cough 08/11/2020   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   Pure hypercholesterolemia 01/23/2020   Vitamin D deficiency 10/23/2019   Right hip pain 05/15/2018   Estrogen deficiency 05/15/2018   Class 1 obesity due to excess calories with serious comorbidity and body mass index (BMI) of 31.0 to 31.9 in adult 05/15/2018   Diabetes mellitus with stage 2 chronic kidney disease ([place]) 01/11/2018   Benign hypertension with chronic kidney disease, stage II 01/11/2018    ONSET DATE: 08/25/21 (referral date)   REFERRING DIAG: G20ICD-10-CM Primary parkinsonism ([place])   THERAPY DIAG:  Other lack of coordination  Abnormal posture  Unsteadiness on feet  Other symptoms and signs involving the nervous system  Stiffness of right elbow, not elsewhere classified  Attention and concentration deficit  Visuospatial deficit  Muscle weakness (generalized)  Rationale for Evaluation and Treatment  Rehabilitation  SUBJECTIVE:   SUBJECTIVE STATEMENT: Pt reports that nurse mentioned reacher for picking up things due to decr vision and pt inquires about this today.   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 No  PATIENT GOALS walking and balance, handwriting  OBJECTIVE: Pt demo inability to track inferiorly, limited ability/difficulty to track superiorly, difficulty tracking to the L and significant limitation tracking to the R.  Pt appears to demo decr peripheral vision on both sides R>L.     TODAY'S TREATMENT:    Environmental scanning, very basic, without many items  too low, 12/15 located first pass, min v.c for head turns and organized scan pattern Vision compensation strategies handout issued and reviewed. Therapist recommended that pt does not drive and recommends that she stops working. Pt was also cautioned about fall risk as she carries her dog up and down the stairs daily, pt was encouraged to seek assistance. PWR! Moves basic 4 in supine with therapist cueing pt to watch her hand, min v.c.    PATIENT EDUCATION: Education details: Health and safety inspector strategies however, strongly cautioned pt against driving and working and recommended pt begin making plans to stop these activities due to safety concerns  Person educated: Patient,  Education method: Explanation, handout Education comprehension: verbalized understanding   HOME EXERCISE  PROGRAM: Not yet addressed  ASSESSMENT:  CLINICAL IMPRESSION: Pt with significant visual deficits affecting balance and safety.  Therapist reviewed recommendation that pt stops driving.  PERFORMANCE DEFICITS in functional skills including ADLs, IADLs, coordination, dexterity, proprioception, ROM, FMC, mobility, balance, body mechanics, endurance, decreased knowledge of  precautions, decreased knowledge of use of DME, vision, and UE functional use, cognitive skills including energy/drive, memory, perception, problem solving, safety awareness, and understand, and psychosocial skills including coping strategies.   IMPAIRMENTS are limiting patient from ADLs, IADLs, work, and social participation.   COMORBIDITIES may have co-morbidities  that affects occupational performance. Patient will benefit from skilled OT to address above impairments and improve overall function.  MODIFICATION OR ASSISTANCE TO COMPLETE EVALUATION: No modification of tasks or assist necessary to complete an evaluation.  OT OCCUPATIONAL PROFILE AND HISTORY: Detailed assessment: Review of records and additional review of physical, cognitive, psychosocial history related to current functional performance.  CLINICAL DECISION MAKING: Moderate - several treatment options, min-mod task modification necessary  REHAB POTENTIAL: Fair impaired vision and cognition  EVALUATION COMPLEXITY: Moderate   GOALS: Goals reviewed with patient? Yes  SHORT TERM GOALS: Target date: 11/05/2021   Independent w/ modified PD HEP (? Bag ex's, focus on ROM, trunk) Baseline: Goal status: INITIAL  2.  Pt to verbalize understanding w/ safety and fall prevention techniques including DME for showering Baseline:  Goal status: INITIAL  3.  Pt to improve standing functional reach test to 10" or greater Rt side Baseline: 9" (Lt = 12")  Goal status: INITIAL  4.  Pt to verbalize understanding with handwriting strategies and demo minimal micrographia for 3 sentences Baseline:  Goal status: INITIAL  5.  Pt will verbalize understanding of adaptive strategies to increase ease/efficiency and safety w/ ADLS/IADLS Baseline:  Goal status: INITIAL  6.  Pt to improve Rt hand coordination as evidenced by performing 9 hole peg test in 35 sec or less Baseline: Rt = 40 sec (Lt = 28 sec)  Goal status: INITIAL  LONG TERM GOALS:  Target date: 12/03/2021    Pt will verbalize understanding of ways to prevent future related complications and appropriate community resources prn Baseline:  Goal status: INITIAL  2.  Pt will verbalize understanding of cognitive compensations Baseline:  Goal status: INITIAL  3.  Pt will demo visual compensations and safely navigate environment w/ 80% accuracy in environmental scanning Baseline:  Goal status: INITIAL  4.  Pt will improve button/unbutton test to 50 sec or less Baseline: 56 sec Goal status: INITIAL  5.  Pt demo full reaching RUE w/ elbow ext to -20* or less Baseline: -30* Goal status: INITIAL   PLAN: OT FREQUENCY: 2x/week  OT DURATION: 8 weeks, plus eval  PLANNED INTERVENTIONS: self care/ADL training, therapeutic exercise, therapeutic activity, neuromuscular re-education, passive range of motion, balance training, functional mobility training, patient/family education, cognitive remediation/compensation, visual/perceptual remediation/compensation, coping strategies training, DME and/or AE instructions, and Re-evaluation  RECOMMENDED OTHER SERVICES: ? Driving eval, resources for Assisted living  CONSULTED AND AGREED WITH PLAN OF CARE: Patient  PLAN FOR NEXT SESSION: visual HEP (tracking, saccades); continue to discuss visual compensation strategies and reinforce safety with ADLs due to balance/vision    Nateisha Moyd, OTR/L 10/15/2021, 3:13 PM

## 2021-10-15 NOTE — Patient Instructions (Signed)
Sit to Stand Transfers:  Scoot out to the edge of the chair Place your feet flat on the floor, shoulder width apart.  Make sure your feet are tucked just under your knees. Lean forward (nose over toes) -THINK BIG LEAN AND NOSE OVER TOES.  and stand up tall with your best posture.  Can have arms out in front of you to help with leaning forwards. If you need to use your arms, use them as a quick boost up to stand. If you are in a low or soft chair, you can lean back and then forward up to stand, in order to get more momentum. Once you are standing, make sure you are looking ahead and standing tall.  To sit down:  Back up until you feel the chair behind your legs. Bend at you hips, [place]!! then slowly squat to sit down on your chair.

## 2021-10-20 ENCOUNTER — Ambulatory Visit: Payer: Medicare HMO | Admitting: Physical Therapy

## 2021-10-20 ENCOUNTER — Ambulatory Visit: Payer: Medicare HMO

## 2021-10-20 ENCOUNTER — Ambulatory Visit: Payer: Medicare HMO | Admitting: Occupational Therapy

## 2021-10-20 ENCOUNTER — Encounter: Payer: Self-pay | Admitting: Physical Therapy

## 2021-10-20 DIAGNOSIS — R2681 Unsteadiness on feet: Secondary | ICD-10-CM

## 2021-10-20 DIAGNOSIS — R471 Dysarthria and anarthria: Secondary | ICD-10-CM

## 2021-10-20 DIAGNOSIS — R278 Other lack of coordination: Secondary | ICD-10-CM

## 2021-10-20 DIAGNOSIS — R41842 Visuospatial deficit: Secondary | ICD-10-CM

## 2021-10-20 DIAGNOSIS — R29818 Other symptoms and signs involving the nervous system: Secondary | ICD-10-CM | POA: Diagnosis not present

## 2021-10-20 DIAGNOSIS — Z9181 History of falling: Secondary | ICD-10-CM | POA: Diagnosis not present

## 2021-10-20 DIAGNOSIS — M25621 Stiffness of right elbow, not elsewhere classified: Secondary | ICD-10-CM | POA: Diagnosis not present

## 2021-10-20 DIAGNOSIS — R1310 Dysphagia, unspecified: Secondary | ICD-10-CM | POA: Diagnosis not present

## 2021-10-20 DIAGNOSIS — M6281 Muscle weakness (generalized): Secondary | ICD-10-CM

## 2021-10-20 DIAGNOSIS — R2689 Other abnormalities of gait and mobility: Secondary | ICD-10-CM | POA: Diagnosis not present

## 2021-10-20 DIAGNOSIS — R41840 Attention and concentration deficit: Secondary | ICD-10-CM | POA: Diagnosis not present

## 2021-10-20 DIAGNOSIS — R293 Abnormal posture: Secondary | ICD-10-CM

## 2021-10-20 DIAGNOSIS — R41841 Cognitive communication deficit: Secondary | ICD-10-CM | POA: Diagnosis not present

## 2021-10-20 NOTE — Therapy (Signed)
OUTPATIENT OCCUPATIONAL THERAPY PARKINSON'S Treatment  Patient Name: April Larson MRN: [id number] DOB:06/08/1950, 71 y.o., female Today's Date: 10/20/2021  PCP: Glendale Chard, MD REFERRING PROVIDER: [name], MD    OT End of Session - 10/20/21 1612     Visit Number 4    Number of Visits 17    Date for OT Re-Evaluation 12/03/21    Authorization Type [employer] MCR    Authorization - Visit Number 4    Progress Note Due on Visit 10    OT Start Time 1104    OT Stop Time 1145    OT Time Calculation (min) 41 min               Past Medical History:  Diagnosis Date   Diabetes mellitus without complication ([place])    Hypertension    Substance abuse ([place])    Past Surgical History:  Procedure Laterality Date   EYE SURGERY     Patient Active Problem List   Diagnosis Date Noted   Decreased dorsalis pedis pulse 11/17/2020   Tachycardia 08/11/2020   Cough 08/11/2020   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   Pure hypercholesterolemia 01/23/2020   Vitamin D deficiency 10/23/2019   Right hip pain 05/15/2018   Estrogen deficiency 05/15/2018   Class 1 obesity due to excess calories with serious comorbidity and body mass index (BMI) of 31.0 to 31.9 in adult 05/15/2018   Diabetes mellitus with stage 2 chronic kidney disease ([place]) 01/11/2018   Benign hypertension with chronic kidney disease, stage II 01/11/2018    ONSET DATE: 08/25/21 (referral date)   REFERRING DIAG: G20ICD-10-CM Primary parkinsonism ([place])   THERAPY DIAG:  Muscle weakness (generalized)  Other lack of coordination  Stiffness of right elbow, not elsewhere classified  Attention and concentration deficit  Visuospatial deficit  Other symptoms and signs involving the nervous system  Unsteadiness on feet  Abnormal posture  Rationale for Evaluation and Treatment Rehabilitation  SUBJECTIVE:   SUBJECTIVE STATEMENT: Pt reports that nurse mentioned reacher for picking up things due to decr  vision and pt inquires about this today.   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 No  PATIENT GOALS walking and balance, handwriting  OBJECTIVE: Pt demo inability to track inferiorly, limited ability/difficulty to track superiorly, difficulty tracking to the L and significant limitation tracking to the R.  Pt appears to demo decr peripheral vision on both sides R>L.     TODAY'S TREATMENT:    PWR! Moves basic 4 in supine  for PWR! up, rock and twist with therapist cueing pt to watch her hand, min v.c. Discussion with pt/ sister regarding safety, and recommendations including driving, care of her dog, and recommendation that pt retires. Writing strategies/ education with coban wrapped pen with emphasis on letter size and legibility, pt is most successful when she prints. Pt requires mod v.c to slow down and deliberately form each letter. Mod difficulty and significant micrographia with cursive. Vsion HEP issued, pt returned demonstration with min v.c      PATIENT EDUCATION: Education details: Visual HEP, see pt instructions,  Pt.strongly cautioned pt against driving and working and recommended pt begin making plans to stop these activities due to safety concerns  Person educated: Patient, sister Education method: Explanation, handout Education comprehension: verbalized understanding   HOME EXERCISE PROGRAM: Not yet addressed  GOALS: Goals reviewed with patient? Yes  SHORT TERM GOALS: Target date: 11/05/2021   Independent w/ modified PD HEP (? Bag ex's, focus on ROM, trunk) Baseline: Goal status: INITIAL  2.  Pt to verbalize understanding w/ safety and fall prevention techniques including DME for showering Baseline:  Goal status: INITIAL  3.  Pt to improve standing functional reach test to 10" or greater Rt  side Baseline: 9" (Lt = 12")  Goal status: INITIAL  4.  Pt to verbalize understanding with handwriting strategies and demo minimal micrographia for 3 sentences Baseline:  Goal status: INITIAL  5.  Pt will verbalize understanding of adaptive strategies to increase ease/efficiency and safety w/ ADLS/IADLS Baseline:  Goal status: INITIAL  6.  Pt to improve Rt hand coordination as evidenced by performing 9 hole peg test in 35 sec or less Baseline: Rt = 40 sec (Lt = 28 sec)  Goal status: INITIAL  LONG TERM GOALS: Target date: 12/03/2021    Pt will verbalize understanding of ways to prevent future related complications and appropriate community resources prn Baseline:  Goal status: INITIAL  2.  Pt will verbalize understanding of cognitive compensations Baseline:  Goal status: INITIAL  3.  Pt will demo visual compensations and safely navigate environment w/ 80% accuracy in environmental scanning Baseline:  Goal status: INITIAL  4.  Pt will improve button/unbutton test to 50 sec or less Baseline: 56 sec Goal status: INITIAL  5.  Pt demo full reaching RUE w/ elbow ext to -20* or less Baseline: -30* Goal status: INITIAL   ASSESSMENT:  CLINICAL IMPRESSION:Pt with significant visual deficits affecting balance and safety.  Pt is progressing towards goals for handwriting when she prints. Pt continues to demonstrate micrographia with cursive.  PERFORMANCE DEFICITS in functional skills including ADLs, IADLs, coordination, dexterity, proprioception, ROM, FMC, mobility, balance, body mechanics, endurance, decreased knowledge of precautions, decreased knowledge of use of DME, vision, and UE functional use, cognitive skills including energy/drive, memory, perception, problem solving, safety awareness, and understand, and psychosocial skills including coping strategies.   IMPAIRMENTS are limiting patient from ADLs, IADLs, work, and social participation.   COMORBIDITIES may have  co-morbidities  that affects occupational performance. Patient will benefit from skilled OT to address above impairments and improve overall function.  MODIFICATION OR ASSISTANCE TO COMPLETE EVALUATION: No modification of tasks or assist necessary to complete an evaluation.  OT OCCUPATIONAL PROFILE AND HISTORY: Detailed assessment: Review of records and additional review of physical, cognitive, psychosocial history related to current functional performance.  CLINICAL DECISION MAKING: Moderate - several treatment options, min-mod task modification necessary  REHAB POTENTIAL: Fair impaired vision and cognition  EVALUATION COMPLEXITY: Moderate  PLAN: OT FREQUENCY: 2x/week  OT DURATION: 8 weeks, plus eval  PLANNED INTERVENTIONS: self care/ADL training, therapeutic exercise, therapeutic activity, neuromuscular re-education, passive range of motion, balance training, functional mobility training, patient/family education, cognitive remediation/compensation, visual/perceptual remediation/compensation, coping strategies training, DME and/or AE instructions, and Re-evaluation  RECOMMENDED OTHER SERVICES: ? Driving eval, resources for Assisted living  CONSULTED AND AGREED WITH PLAN OF CARE: Patient  PLAN FOR NEXT SESSION: visual HEP review (tracking, saccades);  reinforce safety with ADLs due to balance/vision    Saliah Crisp, OTR/L 10/20/2021, 4:19 PM

## 2021-10-20 NOTE — Patient Instructions (Addendum)
Perform 2x day Hold a card at arms length in front of you Move the card up and down, keep head still and follow it with your eyes 10 reps  Next hold the card in front of you,  move the card side to side, follow with your eyes, keep head still, 10 reps

## 2021-10-20 NOTE — Therapy (Signed)
OUTPATIENT SPEECH LANGUAGE PATHOLOGY TREATMENT NOTE   Patient Name: April Larson MRN: [id number] DOB:03/21/1951, 71 y.o., female Today's Date: 10/20/2021  PCP: Glendale Chard, MD REFERRING PROVIDER: [name], MD   END OF SESSION:   End of Session - 10/20/21 1025     Visit Number 4    Number of Visits 17    Date for SLP Re-Evaluation 12/04/21    Authorization Type Aetna Medicare    Authorization Time Period $20 co pay per day    SLP Start Time 1150   late arrival from OT   SLP Stop Time  9381    SLP Time Calculation (min) 45 min    Activity Tolerance Patient tolerated treatment well             Past Medical History:  Diagnosis Date   Diabetes mellitus without complication ([place])    Hypertension    Substance abuse ([place])    Past Surgical History:  Procedure Laterality Date   EYE SURGERY     Patient Active Problem List   Diagnosis Date Noted   Decreased dorsalis pedis pulse 11/17/2020   Tachycardia 08/11/2020   Cough 08/11/2020   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   Pure hypercholesterolemia 01/23/2020   Vitamin D deficiency 10/23/2019   Right hip pain 05/15/2018   Estrogen deficiency 05/15/2018   Class 1 obesity due to excess calories with serious comorbidity and body mass index (BMI) of 31.0 to 31.9 in adult 05/15/2018   Diabetes mellitus with stage 2 chronic kidney disease ([place]) 01/11/2018   Benign hypertension with chronic kidney disease, stage II 01/11/2018    ONSET DATE: PD dx 2021; 08/25/2021 (referral date)  REFERRING DIAG: R47.1ICD-10-CM Dysarthria and anarthria  R41.841ICD-10-CM Cognitive communication deficit  R13.10ICD-10-CM Dysphagia, unspecified type   THERAPY DIAG:  Cognitive communication deficit  Dysarthria and anarthria  Rationale for Evaluation and Treatment Rehabilitation  SUBJECTIVE: "I lost your papers last time" '  PAIN:  Are you having pain? No   OBJECTIVE:  TODAY'S TREATMENT:  10-20-21: Reported she  lost hwk from last session. Targeted functional time management tasks today, in which pt completed with rare min A and benefited from cued double check. Discussed targeted cognitive communication strategies from last session, in which pt able to recall with mod I. Some increased awareness exhibited, in which pt used retrospective analysis to determine event x1 in which "stop and think" would have benefited her. Pt alerted sister of self-advocacy plan if patient is not understanding or needs repetition. Targeted functional time calculations for her morning routine to aid problem solving and attention for prepared arrival of transportation. Pt able to identify she needed more time to realistically complete her morning routine. Requested pt ID solutions for next ST session.   10-15-21: Pt reportedly completed personal goal to organize dining room table in order to eat there at mealtimes for increased safety. Improved swallow function reported with use of recommended techniques (see previous note). Pt reported a fall following last therapy sessions while attempting to walk into her house holding items in both hands. Pt denied any cognitive or physical changes s/p fall despite endorsing hitting her head. SLP recommended ongoing monitoring in case sx arise. SLP educated and trained technique to aid processing and problem solving, with pt implementing intentional pause to process and plan prior to execution of task. Discussed importance of initiating this technique as thought processing has slowed and reduced safety awareness demonstrated. Targeted time management task, in which pt required usual repetition/rephrasing to optimize  comprehension of auditory question x1/4. SLP recommended pt self-advocate if she is not understanding or attending in conversation, with example phrases provided to aid self-advocacy. No overt dysnomia or anomia exhibited in conversation today. Pt was noted to repeat therapist very frequently and  attempt to fill in answers.   10-13-21: SLP targeted functional recall of therapeutic activities in PT/OT today, with good recall affirmed by sister. Completed clinical swallow assessment of regular textures and thin liquids, in which pt exhibited cough x1 with seemingly consecutive sips of water. No other overt s/sx of aspiration exhibited. No anterior spillage noted. Good oral clearance and bolus manipulation exhibited. Overall swallow initiation appeared timely. Pt stated "wow, I'm not coughing as much" which sister correlated to less distracting environment. SLP educated and instructed recommended aspiration precautions, including small bites, slow rate, and eliminating environmental distractions. Pt reported standing during most meals due to being "rushed", in which SLP recommended patient sit at dining room table. Will plan to target time management in upcoming ST sessions. Discussed cognitive strategies and tasks to organize environment at home due to difficulty recalling locations of papers secondary to poor organization. SLP recommended patient receive support from family due to reduced motivation endorsed. Utilized personal goal setting to aid patient motivation and carryover of recommendations.   10-08-21: Educated patient on evaluation results and clinical observations. Pt verbalized understanding and agreement with initiation of ST POC to address cognitive linguistic changes, dysarthria, and possibly dysphagia.    PATIENT EDUCATION: Education details: see above Person educated: Patient Education method: Customer service manager Education comprehension: verbalized understanding, returned demonstration, and needs further education     GOALS: Goals reviewed with patient? Yes   SHORT TERM GOALS: Target date: 11/05/2021    Pt will complete clinical swallow assessment in first 1-2 ST sessions and pt will complete MBSS as needed for additional assessment per SLP recommendation   Baseline: Goal status: Met (consider future MBSS referral pending implementation of swallow precautions)   2.  Pt will utilize dysarthria compensations to optimize vocal intensity and clarity in short structured conversations given occasional mod A over 2 sessions Baseline:  Goal status: ongoing   3.  Pt will utilize word retrieval strategies to aid anomia/dysnomia on structured speech tasks given occasional mod A over 2 sessions Baseline:  Goal status: ongoing   4.  Pt will implement attention/processing strategies in short structured conversations to aid patient engagement given occasional mod A over 2 sessions  Baseline:  Goal status: ongoing   5.  Pt will implement memory compensations to optimize performance at work and home given occasional mod A over 2 sessions Baseline:  Goal status: ongoing   6.  Family will utilize techniques to aid patient processing speed and comprehension given occasional min A over 2 sessions  Baseline:  Goal status: ongoing    LONG TERM GOALS: Target date: 12/04/2021     Pt will implement attention/processing strategies in 10-15 minute conversations to aid patient engagement given occasional min A over 2 sessions  Baseline:  Goal status: ongoing   2.  Pt will demonstrate ability to ID and correct/compensate for anomia/dysnomia in 10-15 minute conversations given occasional min A over 2 sessions Baseline:  Goal status: ongoing   3.  Pt will implement memory compensations to optimize performance at work and home given occasional min A over 2 sessions Baseline:  Goal status: ongoing   4.  Family will utilize techniques to aid patient processing speed and comprehension given rare min A over 2  sessions  Baseline:  Goal status: ongoing   5.  Pt will consume safest and least restrictive diet with reduced s/sx of aspiration with use of trained techniques as needed over 2 sessions Baseline:  Goal status: ongoing   ASSESSMENT:   CLINICAL  IMPRESSION: Patient is a 71 y.o. 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 Targeted cognitive strategies to implement at home to aid cognitive functioning (processing, recall, attention) with support from family requested to aid carryover. Given recent changes in cognition, communication, and swallowing, pt would benefit from skilled ST intervention to optimize functional independence and safety.    OBJECTIVE IMPAIRMENTS  Objective impairments include attention, memory, executive functioning, expressive language, dysarthria, and dysphagia. These impairments are limiting patient from household responsibilities, ADLs/IADLs, effectively communicating at home and in community, and safety when swallowing.Factors affecting potential to achieve goals and functional outcome are ability to learn/carryover information, cooperation/participation level, medical prognosis, and previous level of function. Patient will benefit from skilled SLP services to address above impairments and improve overall function.   REHAB POTENTIAL: Fair - hx of reduced carryover of HEP   PLAN: SLP FREQUENCY: 2x/week   SLP DURATION: 8 weeks   PLANNED INTERVENTIONS: Language facilitation, Environmental controls, Cueing hierachy, Cognitive reorganization, Internal/external aids, Functional tasks, Multimodal communication approach, SLP instruction and feedback, Compensatory strategies, and Patient/family education     Marzetta Board, CCC-SLP 10/20/2021, 1:22 PM

## 2021-10-20 NOTE — Therapy (Signed)
OUTPATIENT PHYSICAL THERAPY NEURO TREATMENT   Patient Name: April Larson MRN: [id number] DOB:1950/06/25, 71 y.o., female Today's Date: 10/20/2021   PCP: Glendale Chard, MD REFERRING PROVIDER: [name], MD    PT End of Session - 10/20/21 1017     Visit Number 4    Number of Visits 17   Plus eval   Date for PT Re-Evaluation 12/10/21    Authorization Type A[employer] Medicare    Progress Note Due on Visit 10    PT Start Time 1016    PT Stop Time 1058    PT Time Calculation (min) 42 min    Activity Tolerance Patient tolerated treatment well    Behavior During Therapy WFL for tasks assessed/performed              Past Medical History:  Diagnosis Date   Diabetes mellitus without complication ([place])    Hypertension    Substance abuse ([place])    Past Surgical History:  Procedure Laterality Date   EYE SURGERY     Patient Active Problem List   Diagnosis Date Noted   Decreased dorsalis pedis pulse 11/17/2020   Tachycardia 08/11/2020   Cough 08/11/2020   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   Pure hypercholesterolemia 01/23/2020   Vitamin D deficiency 10/23/2019   Right hip pain 05/15/2018   Estrogen deficiency 05/15/2018   Class 1 obesity due to excess calories with serious comorbidity and body mass index (BMI) of 31.0 to 31.9 in adult 05/15/2018   Diabetes mellitus with stage 2 chronic kidney disease ([place]) 01/11/2018   Benign hypertension with chronic kidney disease, stage II 01/11/2018    ONSET DATE: 08/25/2021 (referral)  REFERRING DIAG: R47.1 (ICD-10-CM) - Dysarthria and anarthria R41.841 (ICD-10-CM) - Cognitive communication deficit R13.10 (ICD-10-CM) - Dysphagia, unspecified type R27.8 (ICD-10-CM) - Other lack of coordination R26.81 (ICD-10-CM) - Unsteadiness on feet R29.3 (ICD-10-CM) - Abnormal posture G20 (ICD-10-CM) - Primary parkinsonism ([place])   THERAPY DIAG:  Abnormal posture  Unsteadiness on feet  Other symptoms and signs involving the  nervous system  Muscle weakness (generalized)  Rationale for Evaluation and Treatment Rehabilitation  SUBJECTIVE:                                                                                                                                                                                              SUBJECTIVE STATEMENT: Went to church over the weekend, it went well. No falls. Exercises are going well at home.    PERTINENT HISTORY: HTN, DM, high chol, CKD, Supranuclear Palsy   PAIN:  Are you having pain? No  PRECAUTIONS: Fall  PATIENT GOALS "I  wanna work on walking"   OBJECTIVE:   COGNITION: Overall cognitive status: No family/caregiver present to determine baseline cognitive functioning   TODAY'S TREATMENT:  Self Care/Home Management:   Provided fall prevention handout (see pt instructions) and reviewed major points with pt's sister present - installing grab bars, no throw rugs, adequate lighting. Discussed use of apple watch/Life alert in case pt has a fall so that it can alert family/emergency services as pt currently lives on her own.   Discussed safety with taking her dog out and cautioned about fall risk as pt currently has to carry her dog up and down the stairs daily and has to bend down to pick her up off the ground, and seeing if a family member can help take her out.    NMR  Reviewed technique of sit <> stands - importance of taking her time, scooting out towards edge, tucking feet under, and performing with incr forward lean (main emphasis on this part) to help with preventing retropulsion and improving balance, educated on importance of performing daily.   Pt performs PWR! Moves in standing position at end of mat table with chair in front as needed for balance.    PWR! Up for improved posture x10 reps - cues for scap retraction and R elbow extension.   PWR! Rock for improved weighshifting x10 reps each side, cued to look up at hands   PWR! Twist for  improved trunk rotation x10 reps each side, cued for opening up big in the middle and full twist to opposite side and to look at hands with head/eyes   PWR! Step for improved step initiation x10 reps each side, cued for foot clearance with RLE and picking up feet.    With 2 smaller orange and 2 larger obstacles, performed down and back x6 reps with alternating pattern with focus on slowed SLS for balance and foot clearance, min guard at times needed for balance.    PATIENT EDUCATION: Education details: See Self-Care section above. Re-printed out lateral stepping and reaching to HEP and gave better picture. Reviewed importance of visual compensation strategies.  Person educated: Patient Education method: Explanation, Demonstration, and Handouts Education comprehension: verbalized understanding and needs further education    HOME EXERCISE PROGRAM: Access Code: [email or web address]     GOALS: Goals reviewed with patient? Yes  SHORT TERM GOALS: Target date: 11/05/2021  Pt will be independent with initial HEP for improved strength, balance, transfers and gait.  Baseline: Has HEP from previous bout of therapy, needs to be reviewed  Goal status: INITIAL  2.  Pt will verbalize and demonstrate fall prevention strategies in the home and community for reduced fall risk and frequency Baseline:  Goal status: INITIAL  3.  Pt will improve cog TUG to less than or equal to 11 seconds for improved functional mobility and decreased fall risk.  Baseline: 13.25s  Goal status: INITIAL  4.  Pt will improve 5 x STS to less than or equal to 13 seconds without UE support and absent retropulsion to demonstrate improved functional strength and transfer efficiency.   Baseline: 16.15s w/retropulsion  Goal status: INITIAL  LONG TERM GOALS: Target date: 12/03/2021  Pt will be independent with final HEP for improved strength, balance, transfers and gait.  Baseline:  Goal status: INITIAL  2.  Pt will verbalize  understanding of local PD community resources, including fitness post DC.   Baseline:  Goal status: INITIAL  3.  Pt will improve MiniBest to 21/28 for decreased fall risk  and improvement with compensatory stepping strategies.   Baseline: 16/28 Goal status: INITIAL   ASSESSMENT:  CLINICAL IMPRESSION: Pt's sister present today with review of fall prevention for safety and provided handout. Worked on standing PWR moves for balance and focus on visual scanning and looking at hands and also turning head. Continued to work on sit <> stands as pt continues with retropulsion upon first attempt and needs consistent cues for technique and incr forward lean. Will continue to progress towards LTGs.    PLAN: PT FREQUENCY: 2x/week  PT DURATION: 8 weeks  PLANNED INTERVENTIONS: Therapeutic exercises, Therapeutic activity, Neuromuscular re-education, Balance training, Gait training, Patient/Family education, Stair training, DME instructions, Manual therapy, and Re-evaluation  PLAN FOR NEXT SESSION: sit <>stands, fall prevention strategies. Visual scanning. Stepping strategies. Balance and SLS tasks.    Arliss Journey, PT, DPT 10/20/2021, 1:07 PM

## 2021-10-20 NOTE — Patient Instructions (Signed)
For your personal time management: Identify how long it will realistically take to do the following tasks:   Morning Routine:  Wake up Time:   Length of time to get ready (washed up and fully dressed):  Length of time to prepare breakfast:   Length of time to eat breakfast:   Length of time to clean up breakfast:   Length of time to gather all items needed to leave home:   Length of time to take care of your dog (food and walk):   Extra time as buffer:

## 2021-10-20 NOTE — Patient Instructions (Signed)

## 2021-10-22 ENCOUNTER — Encounter: Payer: Self-pay | Admitting: Physical Therapy

## 2021-10-22 ENCOUNTER — Ambulatory Visit: Payer: Medicare HMO | Admitting: Physical Therapy

## 2021-10-22 ENCOUNTER — Ambulatory Visit: Payer: Medicare HMO

## 2021-10-22 ENCOUNTER — Ambulatory Visit: Payer: Medicare HMO | Admitting: Occupational Therapy

## 2021-10-22 DIAGNOSIS — R293 Abnormal posture: Secondary | ICD-10-CM

## 2021-10-22 DIAGNOSIS — Z9181 History of falling: Secondary | ICD-10-CM | POA: Diagnosis not present

## 2021-10-22 DIAGNOSIS — R41841 Cognitive communication deficit: Secondary | ICD-10-CM

## 2021-10-22 DIAGNOSIS — R2681 Unsteadiness on feet: Secondary | ICD-10-CM

## 2021-10-22 DIAGNOSIS — R278 Other lack of coordination: Secondary | ICD-10-CM | POA: Diagnosis not present

## 2021-10-22 DIAGNOSIS — M6281 Muscle weakness (generalized): Secondary | ICD-10-CM | POA: Diagnosis not present

## 2021-10-22 DIAGNOSIS — R41840 Attention and concentration deficit: Secondary | ICD-10-CM

## 2021-10-22 DIAGNOSIS — R471 Dysarthria and anarthria: Secondary | ICD-10-CM | POA: Diagnosis not present

## 2021-10-22 DIAGNOSIS — R29818 Other symptoms and signs involving the nervous system: Secondary | ICD-10-CM

## 2021-10-22 DIAGNOSIS — M25621 Stiffness of right elbow, not elsewhere classified: Secondary | ICD-10-CM | POA: Diagnosis not present

## 2021-10-22 DIAGNOSIS — R2689 Other abnormalities of gait and mobility: Secondary | ICD-10-CM | POA: Diagnosis not present

## 2021-10-22 DIAGNOSIS — R1310 Dysphagia, unspecified: Secondary | ICD-10-CM | POA: Diagnosis not present

## 2021-10-22 DIAGNOSIS — R41842 Visuospatial deficit: Secondary | ICD-10-CM

## 2021-10-22 NOTE — Progress Notes (Signed)
[facility] PREP Weekly Session  Patient Details  Name: April Larson MRN: [id number] Date of Birth: [birth year] Age: 71 y.o. PCP: Glendale Chard, MD  There were no vitals filed for this visit.   [facility] Weekly seesion - 10/22/21 1000       [facility] "PREP" Location   [facility] "PREP" Location [facility]      Weekly Session   Topic Discussed Healthy eating tips    Minutes exercised this week 20 minutes    Classes attended to date [place] 10/22/2021, 10:49 AM

## 2021-10-22 NOTE — Therapy (Signed)
OUTPATIENT OCCUPATIONAL THERAPY PARKINSON'S Treatment  Patient Name: April Larson MRN: [id number] DOB:[birth year], 71 y.o., female Today's Date: 10/22/2021  PCP: Glendale Chard, MD REFERRING PROVIDER: [name], MD    OT End of Session - 10/22/21 1305     Visit Number 5    Number of Visits 17    Date for OT Re-Evaluation 12/03/21    Authorization Type Aetna MCR    Authorization - Visit Number 5    Progress Note Due on Visit 10    OT Start Time 1020    OT Stop Time 1100    OT Time Calculation (min) 40 min                Past Medical History:  Diagnosis Date   Diabetes mellitus without complication ([place])    Hypertension    Substance abuse ([place])    Past Surgical History:  Procedure Laterality Date   EYE SURGERY     Patient Active Problem List   Diagnosis Date Noted   Decreased dorsalis pedis pulse 11/17/2020   Tachycardia 08/11/2020   Cough 08/11/2020   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   Pure hypercholesterolemia 01/23/2020   Vitamin D deficiency 10/23/2019   Right hip pain 05/15/2018   Estrogen deficiency 05/15/2018   Class 1 obesity due to excess calories with serious comorbidity and body mass index (BMI) of 31.0 to 31.9 in adult 05/15/2018   Diabetes mellitus with stage 2 chronic kidney disease ([place]) 01/11/2018   Benign hypertension with chronic kidney disease, stage II 01/11/2018    ONSET DATE: 08/25/21 (referral date)   REFERRING DIAG: G20ICD-10-CM Primary parkinsonism ([place])   THERAPY DIAG:  Muscle weakness (generalized)  Other lack of coordination  Stiffness of right elbow, not elsewhere classified  Attention and concentration deficit  Visuospatial deficit  Other symptoms and signs involving the nervous system  Unsteadiness on feet  Rationale for Evaluation and Treatment Rehabilitation  SUBJECTIVE:   SUBJECTIVE STATEMENT: Pt reports that nurse mentioned reacher for picking up things due to decr vision and pt  inquires about this today.   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 No  PATIENT GOALS walking and balance, handwriting  OBJECTIVE: Pt demo inability to track inferiorly, limited ability/difficulty to track superiorly, difficulty tracking to the L and significant limitation tracking to the R.  Pt appears to demo decr peripheral vision on both sides R>L.     TODAY'S TREATMENT:  Environmental scanning 4/15 missed first pass, mainly higher and lower items, min v.c for head movements to locate remainder of items Vision HEP reviewed, pt returned demonstration with min v.c Copying small peg design for visual skills and coordination, min v.c using left and right UE. Functional reaching to place pegs on elevated surface requiring head movements to see target, min v.c     PATIENT EDUCATION: Education details: Radiation protection practitioner, review   Person educated: Patient, bother Education method: Explanation, handout Education comprehension: verbalized understanding returned demo   HOME EXERCISE PROGRAM: Not yet addressed    GOALS: Goals reviewed with patient? Yes  SHORT TERM GOALS: Target date: 11/05/2021   Independent w/ modified PD HEP (? Bag ex's, focus on ROM, trunk) Baseline: Goal status: INITIAL  2.  Pt to verbalize understanding w/ safety and fall prevention techniques including DME for showering Baseline:  Goal status: INITIAL  3.  Pt to improve standing functional reach test to 10" or greater Rt side Baseline: 9" (Lt = 12")  Goal status: INITIAL  4.  Pt to verbalize understanding with handwriting strategies and demo minimal micrographia for 3 sentences Baseline:  Goal status: INITIAL  5.  Pt will verbalize understanding of adaptive strategies to increase ease/efficiency and safety w/ ADLS/IADLS Baseline:  Goal  status: INITIAL  6.  Pt to improve Rt hand coordination as evidenced by performing 9 hole peg test in 35 sec or less Baseline: Rt = 40 sec (Lt = 28 sec)  Goal status: INITIAL  LONG TERM GOALS: Target date: 12/03/2021    Pt will verbalize understanding of ways to prevent future related complications and appropriate community resources prn Baseline:  Goal status: INITIAL  2.  Pt will verbalize understanding of cognitive compensations Baseline:  Goal status: INITIAL  3.  Pt will demo visual compensations and safely navigate environment w/ 80% accuracy in environmental scanning Baseline:  Goal status: INITIAL  4.  Pt will improve button/unbutton test to 50 sec or less Baseline: 56 sec Goal status: INITIAL  5.  Pt demo full reaching RUE w/ elbow ext to -20* or less Baseline: -30* Goal status: INITIAL   ASSESSMENT:  CLINICAL IMPRESSION:Pt is progressing towards goals. She continues to benefit from visual compensation strategies. PERFORMANCE DEFICITS in functional skills including ADLs, IADLs, coordination, dexterity, proprioception, ROM, FMC, mobility, balance, body mechanics, endurance, decreased knowledge of precautions, decreased knowledge of use of DME, vision, and UE functional use, cognitive skills including energy/drive, memory, perception, problem solving, safety awareness, and understand, and psychosocial skills including coping strategies.   IMPAIRMENTS are limiting patient from ADLs, IADLs, work, and social participation.   COMORBIDITIES may have co-morbidities  that affects occupational performance. Patient will benefit from skilled OT to address above impairments and improve overall function.  MODIFICATION OR ASSISTANCE TO COMPLETE EVALUATION: No modification of tasks or assist necessary to complete an evaluation.  OT OCCUPATIONAL PROFILE AND HISTORY: Detailed assessment: Review of records and additional review of physical, cognitive, psychosocial history related to  current functional performance.  CLINICAL DECISION MAKING: Moderate - several treatment options, min-mod task modification necessary  REHAB POTENTIAL: Fair impaired vision and cognition  EVALUATION COMPLEXITY: Moderate  PLAN: OT FREQUENCY: 2x/week  OT DURATION: 8 weeks, plus eval  PLANNED INTERVENTIONS: self care/ADL training, therapeutic exercise, therapeutic activity, neuromuscular re-education, passive range of motion, balance training, functional mobility training, patient/family education, cognitive remediation/compensation, visual/perceptual remediation/compensation, coping strategies training, DME and/or AE instructions, and Re-evaluation  RECOMMENDED OTHER SERVICES: ? Driving eval, resources for Assisted living  CONSULTED AND AGREED WITH PLAN OF CARE: Patient  PLAN FOR NEXT SESSION:  reinforce safety with ADLs due to balance/vision    Jaslynne Dahan, OTR/L 10/22/2021, 1:06 PM  Theone Murdoch, OTR/L [phone] Phone: [phone] 1:10 PM 10/22/21

## 2021-10-22 NOTE — Patient Instructions (Signed)
Re-reading and double checking (this will be very especially important when you go back to work)  If you want to remember something, do something immediately to aid your recall (write yourself a note, put it in your calendar, set an alarm)  Be mindful of your volume - is it too loud or too soft?

## 2021-10-22 NOTE — Therapy (Signed)
OUTPATIENT PHYSICAL THERAPY NEURO TREATMENT   Patient Name: April Larson MRN: [id number] DOB:01-05-1951, 71 y.o., female Today's Date: 10/22/2021   PCP: Glendale Chard, MD REFERRING PROVIDER: [name], MD    PT End of Session - 10/22/21 1101     Visit Number 5    Number of Visits 17   Plus eval   Date for PT Re-Evaluation 12/10/21    Authorization Type Aetna Medicare    Progress Note Due on Visit 10    PT Start Time 1100    PT Stop Time 1144    PT Time Calculation (min) 44 min    Equipment Utilized During Treatment Gait belt    Activity Tolerance Patient tolerated treatment well    Behavior During Therapy WFL for tasks assessed/performed              Past Medical History:  Diagnosis Date   Diabetes mellitus without complication ([place])    Hypertension    Substance abuse ([place])    Past Surgical History:  Procedure Laterality Date   EYE SURGERY     Patient Active Problem List   Diagnosis Date Noted   Decreased dorsalis pedis pulse 11/17/2020   Tachycardia 08/11/2020   Cough 08/11/2020   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   Pure hypercholesterolemia 01/23/2020   Vitamin D deficiency 10/23/2019   Right hip pain 05/15/2018   Estrogen deficiency 05/15/2018   Class 1 obesity due to excess calories with serious comorbidity and body mass index (BMI) of 31.0 to 31.9 in adult 05/15/2018   Diabetes mellitus with stage 2 chronic kidney disease ([place]) 01/11/2018   Benign hypertension with chronic kidney disease, stage II 01/11/2018    ONSET DATE: 08/25/2021 (referral)  REFERRING DIAG: R47.1 (ICD-10-CM) - Dysarthria and anarthria R41.841 (ICD-10-CM) - Cognitive communication deficit R13.10 (ICD-10-CM) - Dysphagia, unspecified type R27.8 (ICD-10-CM) - Other lack of coordination R26.81 (ICD-10-CM) - Unsteadiness on feet R29.3 (ICD-10-CM) - Abnormal posture G20 (ICD-10-CM) - Primary parkinsonism ([place])   THERAPY DIAG:  Muscle weakness  (generalized)  Unsteadiness on feet  Other symptoms and signs involving the nervous system  Abnormal posture  Rationale for Evaluation and Treatment Rehabilitation  PERTINENT HISTORY: HTN, DM, high chol, CKD, Supranuclear Palsy  PRECAUTIONS: Fall  PATIENT GOALS "I wanna work on walking"   OBJECTIVE:   COGNITION: Overall cognitive status: No family/caregiver present to determine baseline cognitive functioning   SUBJECTIVE: No new complaints. No falls or pain to report.  PAIN:  Are you having pain? No    TODAY'S TREATMENT:   STRENGTHENING  Scifit UE/LE's level 3.5 x 8 minutes with goal >/= 70 steps per minute for strengthening, reciprocal movement training and activity tolerance.   BALANCE/NMR: Gait around track working on scanning and obstacle negotiation with CGA and cues to scan/obstacle avoidance.   Standing next to counter top:  Alternating lateral stepping out with same side reaching up to target<>back in. Had pt tracking hand with reaching. Min guard assist with cues to tracking, 10 reps each side. Alternating lateral stepping with same side reaching out to side to target with pt tracking hand, 10 reps each side. Alternating backward stepping with reach back while tracking hand for 10 reps each side.  Single Leg Stance: on solid floor with 2 tall cones: alternating forward, cross, and double forward foot taps for ~10 reps each/each side. Min guard to min assist for balance with no UE support.  Rockerboard: on balance board in anterior/posterior direction rocking the board with EO, progressing to EC with min guard to min assist for balance with cues on posture/weight shifting. Then holding the board steady for EC 30 seconds x 3 reps. Min guard to min assist with cues as stated.           PATIENT EDUCATION: Education details: continue with current HEP Person educated: Patient Education method: Explanation, Demonstration, and Handouts Education comprehension: verbalized understanding and needs further education    HOME EXERCISE PROGRAM: Access Code: [email or web address]     GOALS: Goals reviewed with patient? Yes  SHORT TERM GOALS: Target date: 11/05/2021  Pt will be independent with initial HEP for improved strength, balance, transfers and gait. Baseline: Has HEP from previous bout of therapy, needs to be reviewed  Goal status: INITIAL  2.  Pt will verbalize and demonstrate fall prevention strategies in the home and community for reduced fall risk and frequency Baseline:  Goal status: INITIAL  3.  Pt will improve cog TUG to less than or equal to 11 seconds for improved functional mobility and decreased fall risk. Baseline: 13.25s  Goal status: INITIAL  4.  Pt will improve 5 x STS to less than or equal to 13 seconds without UE support and absent retropulsion to demonstrate improved functional strength and transfer efficiency.  Baseline: 16.15s w/retropulsion  Goal status: INITIAL  LONG TERM GOALS: Target date: 12/03/2021  Pt will be independent with final HEP for improved strength, balance, transfers and gait. Baseline:  Goal status: INITIAL  2.  Pt will verbalize understanding of local PD community resources, including fitness post DC.  Baseline:  Goal status: INITIAL  3.  Pt will improve MiniBest to 21/28 for decreased fall risk and improvement with compensatory stepping strategies.  Baseline: 16/28 Goal status: INITIAL   ASSESSMENT:  CLINICAL IMPRESSION: Today's skilled session continued to address strengthening and balance with emphasis on environmental scanning with head movements so to avoid obstacles. No issues noted or reported in session. The pt is making steady progress toward goals and should benefit from continued PT to progress toward  unmet goals.   OBJECTIVE IMPAIRMENTS decreased activity tolerance, decreased balance, decreased cognition, decreased coordination, decreased knowledge of condition, decreased knowledge of use of DME, decreased mobility, difficulty walking, decreased strength, decreased safety awareness, impaired perceived functional ability, impaired vision/preception, and improper body mechanics.    ACTIVITY LIMITATIONS carrying, lifting, bending, sitting, standing, squatting, stairs, transfers, and locomotion level   PARTICIPATION LIMITATIONS: meal prep, cleaning, laundry, medication management, driving, shopping, community  activity, and yard work   PERSONAL FACTORS Behavior pattern, Fitness, Transportation, and 1 comorbidity: lack of support in home environment  are also affecting patient's functional outcome.    REHAB POTENTIAL: Good   CLINICAL DECISION MAKING: Evolving/moderate complexity   EVALUATION COMPLEXITY: Moderate  PLAN: PT FREQUENCY: 2x/week  PT DURATION: 8 weeks  PLANNED INTERVENTIONS: Therapeutic exercises, Therapeutic activity, Neuromuscular re-education, Balance training, Gait training, Patient/Family education, Stair training, DME instructions, Manual therapy, and Re-evaluation  PLAN FOR NEXT SESSION:  sit <>stands, fall prevention strategies. Visual scanning. Stepping strategies. Balance and SLS tasks.     Willow Ora, PTA, [facility] [address] [place], [place] [address] [phone] 10/22/21, 4:30 PM

## 2021-10-22 NOTE — Therapy (Signed)
OUTPATIENT SPEECH LANGUAGE PATHOLOGY TREATMENT NOTE   Patient Name: April Larson MRN: [id number] DOB:[birth year], 71 y.o., female Today's Date: 10/22/2021  PCP: Glendale Chard, MD REFERRING PROVIDER: [name], MD   END OF SESSION:   End of Session - 10/22/21 1157     Visit Number 5    Number of Visits 17    Date for SLP Re-Evaluation 12/04/21    Authorization Type [employer]    Authorization Time Period $20 co pay per day    SLP Start Time 1146    SLP Stop Time  1230    SLP Time Calculation (min) 44 min    Activity Tolerance Patient tolerated treatment well             Past Medical History:  Diagnosis Date   Diabetes mellitus without complication ([place])    Hypertension    Substance abuse ([place])    Past Surgical History:  Procedure Laterality Date   EYE SURGERY     Patient Active Problem List   Diagnosis Date Noted   Decreased dorsalis pedis pulse 11/17/2020   Tachycardia 08/11/2020   Cough 08/11/2020   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   Pure hypercholesterolemia 01/23/2020   Vitamin D deficiency 10/23/2019   Right hip pain 05/15/2018   Estrogen deficiency 05/15/2018   Class 1 obesity due to excess calories with serious comorbidity and body mass index (BMI) of 31.0 to 31.9 in adult 05/15/2018   Diabetes mellitus with stage 2 chronic kidney disease ([place]) 01/11/2018   Benign hypertension with chronic kidney disease, stage II 01/11/2018    ONSET DATE: PD dx 2021; 08/25/2021 (referral date)  REFERRING DIAG: R47.1ICD-10-CM Dysarthria and anarthria  R41.841ICD-10-CM Cognitive communication deficit  R13.10ICD-10-CM Dysphagia, unspecified type   THERAPY DIAG:  Cognitive communication deficit  Dysarthria and anarthria  Rationale for Evaluation and Treatment Rehabilitation  SUBJECTIVE: "I did good"  PAIN:  Are you having pain? No   OBJECTIVE:  TODAY'S TREATMENT:  10-22-21: Recalled prior therapy activities and able to ID  strategies to implement (visual scanning). Completed HEP to ID solution to optimize time management, in which pt identified need to wake up earlier. Successful implementation of solution and improved time management reported this morning. Time management worksheet completed independently was 50% accurate, which improved to 95% accurate with SLP re-reading question aloud. Educated patient on attention, recall, and comprehension strategies, such as monotasking, using external recall aids, re-reading to ensure comprehension, and double checking for error awareness. No overt word retrieval difficulty reported recently. SLP assisted patient with self-recognition of variations in volume (too loud versus too soft).   10-20-21: Reported she lost hwk from last session. Targeted functional time management tasks today, in which pt completed with rare min A and benefited from cued double check. Discussed targeted cognitive communication strategies from last session, in which pt able to recall with mod I. Some increased awareness exhibited, in which pt used retrospective analysis to determine event x1 in which "stop and think" would have benefited her. Pt alerted sister of self-advocacy plan if patient is not understanding or needs repetition. Targeted functional time calculations for her morning routine to aid problem solving and attention for prepared arrival of transportation. Pt able to identify she needed more time to realistically complete her morning routine. Requested pt ID solutions for next ST session.   10-15-21: Pt reportedly completed personal goal to organize dining room table in order to eat there at mealtimes for increased safety. Improved swallow function reported with use of  recommended techniques (see previous note). Pt reported a fall following last therapy sessions while attempting to walk into her house holding items in both hands. Pt denied any cognitive or physical changes s/p fall despite endorsing  hitting her head. SLP recommended ongoing monitoring in case sx arise. SLP educated and trained technique to aid processing and problem solving, with pt implementing intentional pause to process and plan prior to execution of task. Discussed importance of initiating this technique as thought processing has slowed and reduced safety awareness demonstrated. Targeted time management task, in which pt required usual repetition/rephrasing to optimize comprehension of auditory question x1/4. SLP recommended pt self-advocate if she is not understanding or attending in conversation, with example phrases provided to aid self-advocacy. No overt dysnomia or anomia exhibited in conversation today. Pt was noted to repeat therapist very frequently and attempt to fill in answers.   10-13-21: SLP targeted functional recall of therapeutic activities in PT/OT today, with good recall affirmed by sister. Completed clinical swallow assessment of regular textures and thin liquids, in which pt exhibited cough x1 with seemingly consecutive sips of water. No other overt s/sx of aspiration exhibited. No anterior spillage noted. Good oral clearance and bolus manipulation exhibited. Overall swallow initiation appeared timely. Pt stated "wow, I'm not coughing as much" which sister correlated to less distracting environment. SLP educated and instructed recommended aspiration precautions, including small bites, slow rate, and eliminating environmental distractions. Pt reported standing during most meals due to being "rushed", in which SLP recommended patient sit at dining room table. Will plan to target time management in upcoming ST sessions. Discussed cognitive strategies and tasks to organize environment at home due to difficulty recalling locations of papers secondary to poor organization. SLP recommended patient receive support from family due to reduced motivation endorsed. Utilized personal goal setting to aid patient motivation and  carryover of recommendations.   10-08-21: Educated patient on evaluation results and clinical observations. Pt verbalized understanding and agreement with initiation of ST POC to address cognitive linguistic changes, dysarthria, and possibly dysphagia.    PATIENT EDUCATION: Education details: see above Person educated: Patient Education method: Customer service manager Education comprehension: verbalized understanding, returned demonstration, and needs further education     GOALS: Goals reviewed with patient? Yes   SHORT TERM GOALS: Target date: 11/05/2021    Pt will complete clinical swallow assessment in first 1-2 ST sessions and pt will complete MBSS as needed for additional assessment per SLP recommendation  Baseline: Goal status: Met (consider future MBSS referral pending implementation of swallow precautions)   2.  Pt will utilize dysarthria compensations to optimize vocal intensity and clarity in short structured conversations given occasional mod A over 2 sessions Baseline:  Goal status: ongoing   3.  Pt will utilize word retrieval strategies to aid anomia/dysnomia on structured speech tasks given occasional mod A over 2 sessions Baseline:  Goal status: ongoing   4.  Pt will implement attention/processing strategies in short structured conversations to aid patient engagement given occasional mod A over 2 sessions  Baseline: 10-22-21 Goal status: ongoing   5.  Pt will implement memory compensations to optimize performance at work and home given occasional mod A over 2 sessions Baseline: 10-22-21 Goal status: ongoing   6.  Family will utilize techniques to aid patient processing speed and comprehension given occasional min A over 2 sessions  Baseline: 10-22-21 Goal status: ongoing    LONG TERM GOALS: Target date: 12/04/2021     Pt will implement attention/processing strategies  in 10-15 minute conversations to aid patient engagement given occasional min A over 2 sessions   Baseline:  Goal status: ongoing   2.  Pt will demonstrate ability to ID and correct/compensate for anomia/dysnomia in 10-15 minute conversations given occasional min A over 2 sessions Baseline:  Goal status: ongoing   3.  Pt will implement memory compensations to optimize performance at work and home given occasional min A over 2 sessions Baseline:  Goal status: ongoing   4.  Family will utilize techniques to aid patient processing speed and comprehension given rare min A over 2 sessions  Baseline:  Goal status: ongoing   5.  Pt will consume safest and least restrictive diet with reduced s/sx of aspiration with use of trained techniques as needed over 2 sessions Baseline:  Goal status: ongoing   ASSESSMENT:   CLINICAL IMPRESSION: Patient is a 71 y.o. 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 Targeted cognitive communication strategies to implement at home to aid cognitive functioning (processing, recall, attention) with support from family requested to aid carryover. Given recent changes in cognition, communication, and swallowing, pt would benefit from skilled ST intervention to optimize functional independence and safety.    OBJECTIVE IMPAIRMENTS  Objective impairments include attention, memory, executive functioning, expressive language, dysarthria, and dysphagia. These impairments are limiting patient from household responsibilities, ADLs/IADLs, effectively communicating at home and in community, and safety when swallowing.Factors affecting potential to achieve goals and functional outcome are ability to learn/carryover information, cooperation/participation level, medical prognosis, and previous level of function. Patient will benefit from skilled SLP services to address above impairments and improve overall function.   REHAB POTENTIAL: Fair - hx of reduced carryover of HEP   PLAN: SLP FREQUENCY: 2x/week   SLP DURATION: 8 weeks   PLANNED INTERVENTIONS:  Language facilitation, Environmental controls, Cueing hierachy, Cognitive reorganization, Internal/external aids, Functional tasks, Multimodal communication approach, SLP instruction and feedback, Compensatory strategies, and Patient/family education     Marzetta Board, CCC-SLP 10/22/2021, 12:56 PM

## 2021-10-26 ENCOUNTER — Other Ambulatory Visit: Payer: Self-pay

## 2021-10-26 MED ORDER — ATORVASTATIN CALCIUM 10 MG PO TABS: 10.0000 mg | ORAL_TABLET | Freq: Every day | ORAL | 0 refills | Status: DC

## 2021-10-27 ENCOUNTER — Encounter: Payer: Self-pay | Admitting: Physical Therapy

## 2021-10-27 ENCOUNTER — Ambulatory Visit: Payer: Medicare HMO | Admitting: Occupational Therapy

## 2021-10-27 ENCOUNTER — Ambulatory Visit: Payer: Medicare HMO

## 2021-10-27 ENCOUNTER — Ambulatory Visit: Payer: Medicare HMO | Admitting: Physical Therapy

## 2021-10-27 DIAGNOSIS — R41840 Attention and concentration deficit: Secondary | ICD-10-CM | POA: Diagnosis not present

## 2021-10-27 DIAGNOSIS — R2681 Unsteadiness on feet: Secondary | ICD-10-CM

## 2021-10-27 DIAGNOSIS — R41842 Visuospatial deficit: Secondary | ICD-10-CM

## 2021-10-27 DIAGNOSIS — R471 Dysarthria and anarthria: Secondary | ICD-10-CM

## 2021-10-27 DIAGNOSIS — M6281 Muscle weakness (generalized): Secondary | ICD-10-CM

## 2021-10-27 DIAGNOSIS — R29818 Other symptoms and signs involving the nervous system: Secondary | ICD-10-CM

## 2021-10-27 DIAGNOSIS — R41841 Cognitive communication deficit: Secondary | ICD-10-CM | POA: Diagnosis not present

## 2021-10-27 DIAGNOSIS — R293 Abnormal posture: Secondary | ICD-10-CM

## 2021-10-27 DIAGNOSIS — Z9181 History of falling: Secondary | ICD-10-CM | POA: Diagnosis not present

## 2021-10-27 DIAGNOSIS — R2689 Other abnormalities of gait and mobility: Secondary | ICD-10-CM | POA: Diagnosis not present

## 2021-10-27 DIAGNOSIS — R1310 Dysphagia, unspecified: Secondary | ICD-10-CM | POA: Diagnosis not present

## 2021-10-27 DIAGNOSIS — R278 Other lack of coordination: Secondary | ICD-10-CM | POA: Diagnosis not present

## 2021-10-27 DIAGNOSIS — M25621 Stiffness of right elbow, not elsewhere classified: Secondary | ICD-10-CM

## 2021-10-27 NOTE — Patient Instructions (Addendum)
SHOULDER: Flexion - Sitting   SHOULDER: Flexion Bilateral    Raise arms overhead at same speed. Keep elbows straight. Hold paper towel roll, watch the roll, keep elbows straight _10__ reps per set, _1-2__ sets per day, __7_ days per week Maintain object in midline.  Copyright  VHI. All rights reserved.

## 2021-10-27 NOTE — Therapy (Signed)
OUTPATIENT SPEECH LANGUAGE PATHOLOGY TREATMENT NOTE   Patient Name: April Larson MRN: [id number] DOB:May 04, 1950, 71 y.o., female Today's Date: 10/27/2021  PCP: Glendale Chard, MD REFERRING PROVIDER: [name], MD   END OF SESSION:   End of Session - 10/27/21 1019     Visit Number 6    Number of Visits 17    Date for SLP Re-Evaluation 12/04/21    Authorization Type [employer]    Authorization Time Period $20 co pay per day    SLP Start Time 41    SLP Stop Time  1145    SLP Time Calculation (min) 45 min    Activity Tolerance Patient tolerated treatment well              Past Medical History:  Diagnosis Date   Diabetes mellitus without complication ([place])    Hypertension    Substance abuse ([place])    Past Surgical History:  Procedure Laterality Date   EYE SURGERY     Patient Active Problem List   Diagnosis Date Noted   Decreased dorsalis pedis pulse 11/17/2020   Tachycardia 08/11/2020   Cough 08/11/2020   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   Pure hypercholesterolemia 01/23/2020   Vitamin D deficiency 10/23/2019   Right hip pain 05/15/2018   Estrogen deficiency 05/15/2018   Class 1 obesity due to excess calories with serious comorbidity and body mass index (BMI) of 31.0 to 31.9 in adult 05/15/2018   Diabetes mellitus with stage 2 chronic kidney disease ([place]) 01/11/2018   Benign hypertension with chronic kidney disease, stage II 01/11/2018    ONSET DATE: PD dx 2021; 08/25/2021 (referral date)  REFERRING DIAG: R47.1ICD-10-CM Dysarthria and anarthria  R41.841ICD-10-CM Cognitive communication deficit  R13.10ICD-10-CM Dysphagia, unspecified type   THERAPY DIAG:  Cognitive communication deficit  Dysarthria and anarthria  Rationale for Evaluation and Treatment Rehabilitation  SUBJECTIVE: "I'm tired. I worked last night"   PAIN:  Are you having pain? No   OBJECTIVE:  TODAY'S TREATMENT:  10-27-21: Pt reported fatigue after working  at [employer] job last night. Denied any cognitive or communicative challenges for part-time work directing guests to seats. Success with time management and establishment of new routines reported by patient and sister. Continues to deny any recent word finding episodes or difficulties. Some reduced recall exhibited in relation to family assisting with organizing her home (sister prompted recall of plan to put dog pad on porch and put mail in stand up versus drawer). SLP educated and instructed memory compensations to aid recall, such as repetition and writing down information to aid patient comprehension and carryover of new procedures. Pt exhibited adequate delayed recall of new procedures with mild prompting and cues. Discussed additional ways to organize house to aid attention and recall of personal items.   10-22-21: Recalled prior therapy activities and able to ID strategies to implement (visual scanning). Completed HEP to ID solution to optimize time management, in which pt identified need to wake up earlier. Successful implementation of solution and improved time management reported this morning. Time management worksheet completed independently was 50% accurate, which improved to 95% accurate with SLP re-reading question aloud. Educated patient on attention, recall, and comprehension strategies, such as monotasking, using external recall aids, re-reading to ensure comprehension, and double checking for error awareness. No overt word retrieval difficulty reported recently. SLP assisted patient with self-recognition of variations in volume (too loud versus too soft).   10-20-21: Reported she lost hwk from last session. Targeted functional time management tasks today,  in which pt completed with rare min A and benefited from cued double check. Discussed targeted cognitive communication strategies from last session, in which pt able to recall with mod I. Some increased awareness exhibited, in which pt used  retrospective analysis to determine event x1 in which "stop and think" would have benefited her. Pt alerted sister of self-advocacy plan if patient is not understanding or needs repetition. Targeted functional time calculations for her morning routine to aid problem solving and attention for prepared arrival of transportation. Pt able to identify she needed more time to realistically complete her morning routine. Requested pt ID solutions for next ST session.   10-15-21: Pt reportedly completed personal goal to organize dining room table in order to eat there at mealtimes for increased safety. Improved swallow function reported with use of recommended techniques (see previous note). Pt reported a fall following last therapy sessions while attempting to walk into her house holding items in both hands. Pt denied any cognitive or physical changes s/p fall despite endorsing hitting her head. SLP recommended ongoing monitoring in case sx arise. SLP educated and trained technique to aid processing and problem solving, with pt implementing intentional pause to process and plan prior to execution of task. Discussed importance of initiating this technique as thought processing has slowed and reduced safety awareness demonstrated. Targeted time management task, in which pt required usual repetition/rephrasing to optimize comprehension of auditory question x1/4. SLP recommended pt self-advocate if she is not understanding or attending in conversation, with example phrases provided to aid self-advocacy. No overt dysnomia or anomia exhibited in conversation today. Pt was noted to repeat therapist very frequently and attempt to fill in answers.   10-13-21: SLP targeted functional recall of therapeutic activities in PT/OT today, with good recall affirmed by sister. Completed clinical swallow assessment of regular textures and thin liquids, in which pt exhibited cough x1 with seemingly consecutive sips of water. No other overt  s/sx of aspiration exhibited. No anterior spillage noted. Good oral clearance and bolus manipulation exhibited. Overall swallow initiation appeared timely. Pt stated "wow, I'm not coughing as much" which sister correlated to less distracting environment. SLP educated and instructed recommended aspiration precautions, including small bites, slow rate, and eliminating environmental distractions. Pt reported standing during most meals due to being "rushed", in which SLP recommended patient sit at dining room table. Will plan to target time management in upcoming ST sessions. Discussed cognitive strategies and tasks to organize environment at home due to difficulty recalling locations of papers secondary to poor organization. SLP recommended patient receive support from family due to reduced motivation endorsed. Utilized personal goal setting to aid patient motivation and carryover of recommendations.    PATIENT EDUCATION: Education details: see above Person educated: Patient Education method: Customer service manager Education comprehension: verbalized understanding, returned demonstration, and needs further education     GOALS: Goals reviewed with patient? Yes   SHORT TERM GOALS: Target date: 11/05/2021    Pt will complete clinical swallow assessment in first 1-2 ST sessions and pt will complete MBSS as needed for additional assessment per SLP recommendation  Baseline: Goal status: Met (consider future MBSS referral pending implementation of swallow precautions)   2.  Pt will utilize dysarthria compensations to optimize vocal intensity and clarity in short structured conversations given occasional mod A over 2 sessions Baseline:  Goal status: ongoing   3.  Pt will utilize word retrieval strategies to aid anomia/dysnomia on structured speech tasks given occasional mod A over 2 sessions  Baseline:  Goal status: ongoing (not reported over last 2 sessions)   4.  Pt will implement  attention/processing strategies in short structured conversations to aid patient engagement given occasional mod A over 2 sessions  Baseline: 10-22-21 Goal status: ongoing   5.  Pt will implement memory compensations to optimize performance at work and home given occasional mod A over 2 sessions Baseline: 10-22-21 Goal status: ongoing   6.  Family will utilize techniques to aid patient processing speed and comprehension given occasional min A over 2 sessions  Baseline: 10-22-21, 10-27-21 Goal status: Met    LONG TERM GOALS: Target date: 12/04/2021     Pt will implement attention/processing strategies in 10-15 minute conversations to aid patient engagement given occasional min A over 2 sessions  Baseline:  Goal status: ongoing   2.  Pt will demonstrate ability to ID and correct/compensate for anomia/dysnomia in 10-15 minute conversations given occasional min A over 2 sessions Baseline:  Goal status: ongoing   3.  Pt will implement memory compensations to optimize performance at work and home given occasional min A over 2 sessions Baseline:  Goal status: ongoing   4.  Family will utilize techniques to aid patient processing speed and comprehension given rare min A over 2 sessions  Baseline:  Goal status: ongoing   5.  Pt will consume safest and least restrictive diet with reduced s/sx of aspiration with use of trained techniques as needed over 2 sessions Baseline:  Goal status: ongoing   ASSESSMENT:   CLINICAL IMPRESSION: Patient is a 71 y.o. 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 Targeted cognitive communication strategies to implement at home to aid cognitive functioning (processing, recall, attention) with support from family requested to aid carryover. Given recent changes in cognition, communication, and swallowing, pt would benefit from skilled ST intervention to optimize functional independence and safety.    OBJECTIVE IMPAIRMENTS  Objective impairments  include attention, memory, executive functioning, expressive language, dysarthria, and dysphagia. These impairments are limiting patient from household responsibilities, ADLs/IADLs, effectively communicating at home and in community, and safety when swallowing.Factors affecting potential to achieve goals and functional outcome are ability to learn/carryover information, cooperation/participation level, medical prognosis, and previous level of function. Patient will benefit from skilled SLP services to address above impairments and improve overall function.   REHAB POTENTIAL: Fair - hx of reduced carryover of HEP   PLAN: SLP FREQUENCY: 2x/week   SLP DURATION: 8 weeks   PLANNED INTERVENTIONS: Language facilitation, Environmental controls, Cueing hierachy, Cognitive reorganization, Internal/external aids, Functional tasks, Multimodal communication approach, SLP instruction and feedback, Compensatory strategies, and Patient/family education     Marzetta Board, CCC-SLP 10/27/2021, 10:20 AM

## 2021-10-27 NOTE — Therapy (Signed)
OUTPATIENT OCCUPATIONAL THERAPY [facility] Treatment  Patient Name: April Larson MRN: [id number] DOB:10/14/1950, 71 y.o., female Today's Date: 10/27/2021  PCP: Glendale Chard, MD REFERRING PROVIDER: [name], MD    OT End of Session - 10/27/21 1128     Visit Number 6    Number of Visits 17    Date for OT Re-Evaluation 12/03/21    Authorization Type Aetna MCR    Authorization - Visit Number 6    Progress Note Due on Visit 10    OT Start Time 1020    OT Stop Time 1100    OT Time Calculation (min) 40 min                 Past Medical History:  Diagnosis Date   Diabetes mellitus without complication ([place])    Hypertension    Substance abuse ([place])    Past Surgical History:  Procedure Laterality Date   EYE SURGERY     Patient Active Problem List   Diagnosis Date Noted   Decreased dorsalis pedis pulse 11/17/2020   Tachycardia 08/11/2020   Cough 08/11/2020   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   Pure hypercholesterolemia 01/23/2020   Vitamin D deficiency 10/23/2019   Right hip pain 05/15/2018   Estrogen deficiency 05/15/2018   Class 1 obesity due to excess calories with serious comorbidity and body mass index (BMI) of 31.0 to 31.9 in adult 05/15/2018   Diabetes mellitus with stage 2 chronic kidney disease ([place]) 01/11/2018   Benign hypertension with chronic kidney disease, stage II 01/11/2018    ONSET DATE: 08/25/21 (referral date)   REFERRING DIAG: G20ICD-10-CM Primary parkinsonism ([place])   THERAPY DIAG:  Muscle weakness (generalized)  Unsteadiness on feet  Other symptoms and signs involving the nervous system  Abnormal posture  Other lack of coordination  Stiffness of right elbow, not elsewhere classified  Attention and concentration deficit  Visuospatial deficit  Rationale for Evaluation and Treatment Rehabilitation  SUBJECTIVE:   SUBJECTIVE STATEMENT: Denies pain  Pt accompanied by: self, sister arrived at end of  session  PERTINENT HISTORY: 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 No  PATIENT GOALS walking and balance, handwriting  OBJECTIVE: Pt demo inability to track inferiorly, limited ability/difficulty to track superiorly, difficulty tracking to the L and significant limitation tracking to the R.  Pt appears to demo decr peripheral vision on both sides R>L.     TODAY'S TREATMENT:  Seated closed chain shoulder flexion with ball, and paper towel roll for closed chain shoulder flexion, min v.c to watch the ball(or roll with head movements),  Simulated ADLS, with bag exercises: donning shirt by reaching overhead, donning socks by crumpling in hand, attempted under each foot for LB dressing however, mod difficulty so discontinued. Pt practiced donning / doffing shorts in seated, on higher surface, min instructional cues then pt completed without assistance. Pt/ sister were shown tub transfer bench and instructed in use. Pt declined practice. Pt was provided with information for purchase. Education regarding visual compensations: scanning, up, down and side to side with head movements before entering a room, making sure to close cabinets in kitchen for safety, writing a reminder note on the door in which dog sleeps   PATIENT EDUCATION: Education details: closed chain shoulder flexion, following target with eyes, use of tub bench/ handout Person educated: Patient, sister Education method: Explanation, Demonstration handout Education comprehension: verbalized understanding (returned demo of exercises)  HOME EXERCISE PROGRAM: 10/27/21-Closed chain shoulder flexion with paper towel roll Vision HEP   GOALS: Goals reviewed with patient? Yes  SHORT TERM GOALS: Target date: 11/05/2021   Independent w/ modified PD HEP (? Bag ex's, focus on ROM, trunk) Baseline: Goal status: INITIAL  2.  Pt to verbalize  understanding w/ safety and fall prevention techniques including DME for showering Baseline:  Goal status: INITIAL  3.  Pt to improve standing functional reach test to 10" or greater Rt side Baseline: 9" (Lt = 12")  Goal status: INITIAL  4.  Pt to verbalize understanding with handwriting strategies and demo minimal micrographia for 3 sentences Baseline:  Goal status: INITIAL  5.  Pt will verbalize understanding of adaptive strategies to increase ease/efficiency and safety w/ ADLS/IADLS Baseline:  Goal status: INITIAL  6.  Pt to improve Rt hand coordination as evidenced by performing 9 hole peg test in 35 sec or less Baseline: Rt = 40 sec (Lt = 28 sec)  Goal status: INITIAL  LONG TERM GOALS: Target date: 12/03/2021    Pt will verbalize understanding of ways to prevent future related complications and appropriate community resources prn Baseline:  Goal status: INITIAL  2.  Pt will verbalize understanding of cognitive compensations Baseline:  Goal status: INITIAL  3.  Pt will demo visual compensations and safely navigate environment w/ 80% accuracy in environmental scanning Baseline:  Goal status: INITIAL  4.  Pt will improve button/unbutton test to 50 sec or less Baseline: 56 sec Goal status: INITIAL  5.  Pt demo full reaching RUE w/ elbow ext to -20* or less Baseline: -30* Goal status: INITIAL   ASSESSMENT:  CLINICAL IMPRESSION:Pt is progressing towards goals. She continues to benefit from ADL and vision strategies.  PERFORMANCE DEFICITS in functional skills including ADLs, IADLs, coordination, dexterity, proprioception, ROM, FMC, mobility, balance, body mechanics, endurance, decreased knowledge of precautions, decreased knowledge of use of DME, vision, and UE functional use, cognitive skills including energy/drive, memory, perception, problem solving, safety awareness, and understand, and psychosocial skills including coping strategies.   IMPAIRMENTS are limiting  patient from ADLs, IADLs, work, and social participation.   COMORBIDITIES may have co-morbidities  that affects occupational performance. Patient will benefit from skilled OT to address above impairments and improve overall function.  MODIFICATION OR ASSISTANCE TO COMPLETE EVALUATION: No modification of tasks or assist necessary to complete an evaluation.  OT OCCUPATIONAL PROFILE AND HISTORY: Detailed assessment: Review of records and additional review of physical, cognitive, psychosocial history related to current functional performance.  CLINICAL DECISION MAKING: Moderate - several treatment options, min-mod task modification necessary  REHAB POTENTIAL: Fair impaired vision and cognition  EVALUATION COMPLEXITY: Moderate  PLAN: OT FREQUENCY: 2x/week  OT DURATION: 8 weeks, plus eval  PLANNED INTERVENTIONS: self care/ADL training, therapeutic exercise, therapeutic activity, neuromuscular re-education, passive range of motion, balance training, functional mobility training, patient/family education, cognitive remediation/compensation, visual/perceptual remediation/compensation, coping strategies training, DME and/or AE instructions, and Re-evaluation  RECOMMENDED OTHER SERVICES: ? Driving eval, resources for Assisted living  CONSULTED AND AGREED WITH PLAN OF CARE: Patient  PLAN FOR NEXT SESSION:  reinforce safety with ADLs due to balance/vision , check STG's next week   Meygan Kyser, OTR/L 10/27/2021, 11:29 AM  Theone Murdoch, OTR/L [phone] Phone: [phone] 11:29 AM 10/27/21

## 2021-10-27 NOTE — Therapy (Signed)
OUTPATIENT PHYSICAL THERAPY NEURO TREATMENT   Patient Name: April Larson MRN: [id number] DOB:25-May-1950, 71 y.o., female Today's Date: 10/27/2021   PCP: Glendale Chard, MD REFERRING PROVIDER: [name], MD    PT End of Session - 10/27/21 1148     Visit Number 6    Number of Visits 17   Plus eval   Date for PT Re-Evaluation 12/10/21    Authorization Type Aetna Medicare    Progress Note Due on Visit 10    PT Start Time 1147    PT Stop Time 1228    PT Time Calculation (min) 41 min    Equipment Utilized During Treatment Gait belt    Activity Tolerance Patient tolerated treatment well    Behavior During Therapy WFL for tasks assessed/performed              Past Medical History:  Diagnosis Date   Diabetes mellitus without complication ([place])    Hypertension    Substance abuse ([place])    Past Surgical History:  Procedure Laterality Date   EYE SURGERY     Patient Active Problem List   Diagnosis Date Noted   Decreased dorsalis pedis pulse 11/17/2020   Tachycardia 08/11/2020   Cough 08/11/2020   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   Pure hypercholesterolemia 01/23/2020   Vitamin D deficiency 10/23/2019   Right hip pain 05/15/2018   Estrogen deficiency 05/15/2018   Class 1 obesity due to excess calories with serious comorbidity and body mass index (BMI) of 31.0 to 31.9 in adult 05/15/2018   Diabetes mellitus with stage 2 chronic kidney disease ([place]) 01/11/2018   Benign hypertension with chronic kidney disease, stage II 01/11/2018    ONSET DATE: 08/25/2021 (referral)  REFERRING DIAG: R47.1 (ICD-10-CM) - Dysarthria and anarthria R41.841 (ICD-10-CM) - Cognitive communication deficit R13.10 (ICD-10-CM) - Dysphagia, unspecified type R27.8 (ICD-10-CM) - Other lack of coordination R26.81 (ICD-10-CM) - Unsteadiness on feet R29.3 (ICD-10-CM) - Abnormal posture G20 (ICD-10-CM) - Primary parkinsonism ([place])   THERAPY DIAG:  Muscle weakness  (generalized)  Unsteadiness on feet  Other symptoms and signs involving the nervous system  Abnormal posture  Rationale for Evaluation and Treatment Rehabilitation  PERTINENT HISTORY: HTN, DM, high chol, CKD, Supranuclear Palsy  PRECAUTIONS: Fall  PATIENT GOALS "I wanna work on walking"   OBJECTIVE:   COGNITION: Overall cognitive status: No family/caregiver present to determine baseline cognitive functioning   SUBJECTIVE: [place] as is feeling very tired today. No falls. Reports exercises are going well at home.  PAIN:  Are you having pain? No    TODAY'S TREATMENT:   STRENGTHENING  Scifit UE/LE's level 3.5 x 8 minutes with goal >/= 80 steps per minute for strengthening, reciprocal movement training and activity tolerance. Pt reporting RPE at 5/10 > 7/10 with cues for incr effort. Pt reporting that she goes to the [facility] every tues/Thursday for an 1 hr cycling class.   BALANCE/NMR: Sit <> stands from mat table with focus on scooting out towards edge, proper foot placement and incr forward lean, performed while holding purple ball with extending arms out and then PT giving pt a visual target to reach towards for incr forward lean/anterior weight shift and then standing tall with ball overhead for posture. Cues for elbow extension with RUE>LUE when reaching overhead x10 reps. Performed additional reps of sit <> stands during session with focus on anterior weight shift to decr retropulsion.   In corner on air ex:  -Wide BOS trunk rotations reaching towards card targets that PT was holding in different directions (superior, laterally, inferiorly) with pt having to read card suit and number for visual scanning with cues to turn head to help look, performed x15 reps each  direction.   -Feet hip width EC 2 x 30 seconds, progressing to feet more narrow 2 x 30 seconds, pt improved with incr reps and reporting feeling more steady  On air ex at countertop: -Wide BOS lateral reaching to sticky notes x7 reps each side progressing to contralateral leg lift for dynamic SLS x7 reps each side, cues for larger movement patterns and min guard needed for balance. Pt more challenged by SLS tasks on RLE.     PATIENT EDUCATION: Education details: Continue with current HEP Person educated: Patient Education method: Explanation Education comprehension: verbalized understanding    HOME EXERCISE PROGRAM: Access Code: [email or web address], Standing PWR moves.      GOALS: Goals reviewed with patient? Yes  SHORT TERM GOALS: Target date: 11/05/2021  Pt will be independent with initial HEP for improved strength, balance, transfers and gait. Baseline: Has HEP from previous bout of therapy, needs to be reviewed  Goal status: INITIAL  2.  Pt will verbalize and demonstrate fall prevention strategies in the home and community for reduced fall risk and frequency Baseline:  Goal status: INITIAL  3.  Pt will improve cog TUG to less than or equal to 11 seconds for improved functional mobility and decreased fall risk. Baseline: 13.25s  Goal status: INITIAL  4.  Pt will improve 5 x STS to less than or equal to 13 seconds without UE support and absent retropulsion to demonstrate improved functional strength and transfer efficiency.  Baseline: 16.15s w/retropulsion  Goal status: INITIAL  LONG TERM GOALS: Target date: 12/03/2021  Pt will be independent with final HEP for improved strength, balance, transfers and gait. Baseline:  Goal status: INITIAL  2.  Pt will verbalize understanding of local PD community resources, including fitness post DC.  Baseline:  Goal status: INITIAL  3.  Pt will improve MiniBest to 21/28 for decreased fall risk and improvement with compensatory stepping  strategies.  Baseline: 16/28 Goal status: INITIAL   ASSESSMENT:  CLINICAL IMPRESSION: Today's skilled session focused on BLE strengthening/activity tolerance, sit <> stands with incr anterior weight shifting, and balance on compliant surfaces while working on trunk rotations/weight shifting and tracking with head/eyes. Pt challenged by adding in visual/cognitive challenge with trunk rotations. Will continue to progress towards LTGs.   OBJECTIVE IMPAIRMENTS decreased activity tolerance, decreased balance, decreased cognition,  decreased coordination, decreased knowledge of condition, decreased knowledge of use of DME, decreased mobility, difficulty walking, decreased strength, decreased safety awareness, impaired perceived functional ability, impaired vision/preception, and improper body mechanics.    ACTIVITY LIMITATIONS carrying, lifting, bending, sitting, standing, squatting, stairs, transfers, and locomotion level   PARTICIPATION LIMITATIONS: meal prep, cleaning, laundry, medication management, driving, shopping, community activity, and yard work   PERSONAL FACTORS Behavior pattern, Fitness, Transportation, and 1 comorbidity: lack of support in home environment  are also affecting patient's functional outcome.    REHAB POTENTIAL: Good   CLINICAL DECISION MAKING: Evolving/moderate complexity   EVALUATION COMPLEXITY: Moderate  PLAN: PT FREQUENCY: 2x/week  PT DURATION: 8 weeks  PLANNED INTERVENTIONS: Therapeutic exercises, Therapeutic activity, Neuromuscular re-education, Balance training, Gait training, Patient/Family education, Stair training, DME instructions, Manual therapy, and Re-evaluation  PLAN FOR NEXT SESSION:  sit <>stands, Visual scanning. Stepping strategies. Balance and SLS tasks. Try standing PWR moves on compliant surfaces.   Janann August, PT, DPT 10/27/21 12:43 PM

## 2021-10-27 NOTE — Patient Instructions (Signed)
Things to remember:   Place dog pads outside on your patio/deck for easy clean up  Put bills to be paid in stand up container on the kitchen counter   Repetition is important for your memory! You may need to hear something multiple times and/or read it   Take a picture of your dresser and bathroom

## 2021-10-28 ENCOUNTER — Ambulatory Visit: Payer: Medicare HMO

## 2021-10-28 DIAGNOSIS — G20 Parkinson's disease: Secondary | ICD-10-CM

## 2021-10-28 DIAGNOSIS — E1122 Type 2 diabetes mellitus with diabetic chronic kidney disease: Secondary | ICD-10-CM

## 2021-10-28 NOTE — Patient Instructions (Signed)
Visit Information  Thank you for taking time to visit with me today. Please don't hesitate to contact me if I can be of assistance to you.  Following are the goals we discussed today:  Patient Goals/Self-Care Activities patient will:   - Engage with [facility] [place] to obtain mobile meals -Review mailed resource information -Contact primary care provider as needed   Please call the care guide team at [phone] if you need to schedule an appointment with me.  If you are experiencing a Mental Health or [place] or need someone to talk to, please go to [facility] [address], [place] [phone])   Patient verbalizes understanding of instructions and care plan provided today and agrees to view in [place]. Active MyChart status and patient understanding of how to access instructions and care plan via MyChart confirmed with patient.     No further follow up required: I have placed your referral to [facility] to receive mobile meals. They will contact you directly.  Daneen Schick, BSW, CDP Social Worker, Certified Dementia Practitioner Care Coordination [phone]

## 2021-10-28 NOTE — Chronic Care Management (AMB) (Signed)
Social Work Note  10/28/2021 Name: April Larson MRN: [id number] DOB: [birth year]  April Larson is a 71 y.o. year old female who is a primary care patient of Glendale Chard, MD.  The Care Management team was consulted for assistance with chronic disease management and care coordination needs.  [name] was given information about Care Management services today including:  Care Management services include personalized support from designated clinical staff supervised by her physician, including individualized plan of care and coordination with other care providers 24/7 contact phone numbers for assistance for urgent and routine care needs. The patient may stop care management services at any time (effective at the end of the month) by phone call to the office staff.  Patient agreed to services and consent obtained.   Engaged with patient by telephone for initial visit in response to provider referral for social work chronic care management and care coordination services.  Assessment: Review of patient past medical history, allergies, medications, and health status, including review of pertinent consultant reports was performed as part of comprehensive evaluation and provision of care management/care coordination services.   SDOH (Social Determinants of Health) assessments and interventions performed:  Yes SDOH Interventions    Flowsheet Row Most Recent Value  SDOH Interventions   Food Insecurity Interventions [id number] Referral, Other (Comment)  [Placed referral to mobile meals,  sent info about [name]]  Transportation Interventions Intervention Not Indicated        Advanced Directives Status: Not addressed in this encounter.  Care Plan  No Known Allergies  Outpatient Encounter Medications as of 10/28/2021  Medication Sig Note   ACCU-CHEK FASTCLIX LANCETS MISC USE TO CHECK BLOOD SUGARS TWICE DAILY    ACCU-CHEK GUIDE test strip USE  TO CHECK BLOOD SUGARS TWICE DAILY AS DIRECTED    aspirin EC 81 MG tablet Take 1 tablet (81 mg total) by mouth daily.    atorvastatin (LIPITOR) 10 MG tablet Take 1 tablet (10 mg total) by mouth daily.    Biotin 10 MG CAPS Take 1 tablet by mouth daily.    Blood Glucose Monitoring Suppl (ACCU-CHEK GUIDE ME) w/Device KIT USE TO CHECK BLOOD GLUCOSE TWICE DAILY    calcium carbonate (OSCAL) 1500 (600 Ca) MG TABS tablet Take 1 tablet (1,500 mg total) by mouth daily with breakfast.    carbidopa-levodopa (SINEMET IR) 25-100 MG tablet TAKE ONE TABLET BY MOUTH four TIMES DAILY. FOLLOW titration instructions provided separately in writing    Cholecalciferol 125 MCG (5000 UT) TABS Take 1 tablet (5,000 Units total) by mouth daily.    diclofenac Sodium (VOLTAREN) 1 % GEL APPLY 2 GRAMS TOPICALLY TO THE AFFECTED AREA FOUR TIMES DAILY    fluticasone (FLONASE) 50 MCG/ACT nasal spray instill TWO SPRAYS into each nostril daily AS DIRECTED    Insulin Pen Needle (B-D ULTRAFINE III SHORT PEN) 31G X 8 MM MISC Use as directed    loratadine (CLARITIN) 10 MG tablet Take 1 tablet by mouth daily    metFORMIN (GLUCOPHAGE) 500 MG tablet Take 1 tablet (500 mg total) by mouth daily with breakfast. Dinner    Multiple Vitamins-Minerals (ONE-A-DAY WOMENS 50+ ADVANTAGE) TABS Take 1 tablet by mouth daily    NON FORMULARY 2 each. Calm 08/13/2020: Taking 2 at night before bed.    Omega-3 Fatty Acids (FISH OIL) 1000 MG CAPS Take 1 capsule by mouth daily (Patient not taking: Reported on 07/23/2021)    omeprazole (PRILOSEC) 40 MG capsule TAKE 1 CAPSULE BY MOUTH  EVERY DAY BEFORE A MEAL    pioglitazone (ACTOS) 15 MG tablet Take 1 tablet (15 mg total) by mouth daily. Monday, wednesday, Friday    telmisartan-hydrochlorothiazide (MICARDIS HCT) 40-12.5 MG tablet Take 1 tablet by mouth daily.    Turmeric 500 MG CAPS Take 1 capsule by mouth 2 (two) times daily.    vitamin C (ASCORBIC ACID) 500 MG tablet Take 1 tablet (500 mg total) by mouth daily.     XULTOPHY 100-3.6 UNIT-MG/ML SOPN INJECT 25 UNITS SUBCUTANEOUSLY DAILY    No facility-administered encounter medications on file as of 10/28/2021.    Patient Active Problem List   Diagnosis Date Noted   Decreased dorsalis pedis pulse 11/17/2020   Tachycardia 08/11/2020   Cough 08/11/2020   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   Pure hypercholesterolemia 01/23/2020   Vitamin D deficiency 10/23/2019   Right hip pain 05/15/2018   Estrogen deficiency 05/15/2018   Class 1 obesity due to excess calories with serious comorbidity and body mass index (BMI) of 31.0 to 31.9 in adult 05/15/2018   Diabetes mellitus with stage 2 chronic kidney disease ([place]) 01/11/2018   Benign hypertension with chronic kidney disease, stage II 01/11/2018    Conditions to be addressed/monitored: DMII, CKD Stage II, and Atypical Parkinsonism ;  Difficulty preparing meals  Care Plan : Social Work Plan of Care  Updates made by Daneen Schick since 10/28/2021 12:00 AM  Completed 10/28/2021   Problem: Healthy Nutrition (Wellness) Resolved 10/28/2021     Goal: Healthy Nutrition Achieved Completed 10/28/2021  Start Date: 10/28/2021  Priority: High  Note:   Current Barriers:  Chronic disease management support and education needs related to DM, CKD Stage II, and Atypical Parkinsonism   Difficulty preparing meals  Social Worker Clinical Goal(s):  patient will work with SW to identify and address any acute and/or chronic care coordination needs related to the self health management of DM, CKD Stage II, and Atypical Parkinsonism   Patient will engage with Tax adviser as directed by SW to obtain mobile meals SW Interventions:  Inter-disciplinary care team collaboration (see longitudinal plan of care) Collaboration with Glendale Chard, MD regarding development and update of comprehensive plan of care as evidenced by provider attestation and co-signature Telephonic visit completed with the patient to  complete an SDoH screen Determined the patient has difficulty preparing meals safely due to impaired vision- patient reports she is unable to see when she looks up or down, 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 Educated the patient on [employer] offered by [employer] - referral placed via [employer] Provided verbal and written education on [name] in the event the patient would like to order home delivered meals Discussed patients sisters assists with meal preparation at this time No other SW needs identified  Patient Goals/Self-Care Activities patient will:   - Museum/gallery conservator with Development worker[facility] [place] to obtain mobile meals -Review mailed resource information -Contact primary care provider as needed  Follow Up Plan: No further follow up required:         Follow Up Plan:  No follow up planned at this time. The patient has been provided resource information.      Daneen Schick, BSW, CDP Social Worker, Certified Dementia Practitioner Care Coordination [phone]

## 2021-10-29 ENCOUNTER — Ambulatory Visit: Payer: Medicare HMO | Admitting: Physical Therapy

## 2021-10-29 ENCOUNTER — Encounter: Payer: Self-pay | Admitting: Physical Therapy

## 2021-10-29 ENCOUNTER — Ambulatory Visit: Payer: Medicare HMO

## 2021-10-29 ENCOUNTER — Ambulatory Visit: Payer: Medicare HMO | Admitting: Occupational Therapy

## 2021-10-29 DIAGNOSIS — R41840 Attention and concentration deficit: Secondary | ICD-10-CM

## 2021-10-29 DIAGNOSIS — R293 Abnormal posture: Secondary | ICD-10-CM | POA: Diagnosis not present

## 2021-10-29 DIAGNOSIS — R278 Other lack of coordination: Secondary | ICD-10-CM

## 2021-10-29 DIAGNOSIS — R41842 Visuospatial deficit: Secondary | ICD-10-CM

## 2021-10-29 DIAGNOSIS — R29818 Other symptoms and signs involving the nervous system: Secondary | ICD-10-CM

## 2021-10-29 DIAGNOSIS — M6281 Muscle weakness (generalized): Secondary | ICD-10-CM

## 2021-10-29 DIAGNOSIS — R2681 Unsteadiness on feet: Secondary | ICD-10-CM

## 2021-10-29 DIAGNOSIS — R471 Dysarthria and anarthria: Secondary | ICD-10-CM | POA: Diagnosis not present

## 2021-10-29 DIAGNOSIS — M25621 Stiffness of right elbow, not elsewhere classified: Secondary | ICD-10-CM

## 2021-10-29 DIAGNOSIS — R1310 Dysphagia, unspecified: Secondary | ICD-10-CM | POA: Diagnosis not present

## 2021-10-29 DIAGNOSIS — Z9181 History of falling: Secondary | ICD-10-CM | POA: Diagnosis not present

## 2021-10-29 DIAGNOSIS — R41841 Cognitive communication deficit: Secondary | ICD-10-CM

## 2021-10-29 DIAGNOSIS — R2689 Other abnormalities of gait and mobility: Secondary | ICD-10-CM | POA: Diagnosis not present

## 2021-10-29 NOTE — Patient Instructions (Signed)
Focus on one task a time - 100% of your focus   Take your time to think. It is okay to pause and gather your thoughts!  Memory strategies: Repetition Write it down (large print, not cursive) Make a connection or association

## 2021-10-29 NOTE — Therapy (Signed)
OUTPATIENT PHYSICAL THERAPY NEURO TREATMENT   Patient Name: April Larson MRN: [id number] DOB:Sep 02, 1950, 71 y.o., female Today's Date: 10/30/2021   PCP: Glendale Chard, MD REFERRING PROVIDER: [name], MD    PT End of Session - 10/29/21 1020     Visit Number 7    Number of Visits 17   Plus eval   Date for PT Re-Evaluation 12/10/21    Authorization Type [employer]    Progress Note Due on Visit 10    PT Start Time 1017    PT Stop Time 1100    PT Time Calculation (min) 43 min    Equipment Utilized During Treatment Gait belt    Activity Tolerance Patient tolerated treatment well    Behavior During Therapy WFL for tasks assessed/performed              Past Medical History:  Diagnosis Date   Diabetes mellitus without complication ([place])    Hypertension    Substance abuse ([place])    Past Surgical History:  Procedure Laterality Date   EYE SURGERY     Patient Active Problem List   Diagnosis Date Noted   Decreased dorsalis pedis pulse 11/17/2020   Tachycardia 08/11/2020   Cough 08/11/2020   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   Pure hypercholesterolemia 01/23/2020   Vitamin D deficiency 10/23/2019   Right hip pain 05/15/2018   Estrogen deficiency 05/15/2018   Class 1 obesity due to excess calories with serious comorbidity and body mass index (BMI) of 31.0 to 31.9 in adult 05/15/2018   Diabetes mellitus with stage 2 chronic kidney disease ([place]) 01/11/2018   Benign hypertension with chronic kidney disease, stage II 01/11/2018    ONSET DATE: 08/25/2021 (referral)  REFERRING DIAG: R47.1 (ICD-10-CM) - Dysarthria and anarthria R41.841 (ICD-10-CM) - Cognitive communication deficit R13.10 (ICD-10-CM) - Dysphagia, unspecified type R27.8 (ICD-10-CM) - Other lack of coordination R26.81 (ICD-10-CM) - Unsteadiness on feet R29.3 (ICD-10-CM) - Abnormal posture G20 (ICD-10-CM) - Primary parkinsonism ([place])   THERAPY DIAG:  Muscle weakness  (generalized)  Unsteadiness on feet  Rationale for Evaluation and Treatment Rehabilitation  PERTINENT HISTORY: HTN, DM, high chol, CKD, Supranuclear Palsy  PRECAUTIONS: Fall  PATIENT GOALS "I wanna work on walking"   OBJECTIVE:   COGNITION: Overall cognitive status: No family/caregiver present to determine baseline cognitive functioning   SUBJECTIVE: No new complaints. No falls. "It was busy" when asked about last session.  PAIN:  Are you having pain? No    TODAY'S TREATMENT:   STRENGTHENING  Scifit UE/LE's level 4.0 x 8 minutes with goal >/= 80 steps per minute for strengthening, reciprocal movement training and activity tolerance. Pt reported fatigue afterwards with increased resistance today.   BALANCE/NMR: Gait around track:  Scanning low<>high randomly on both sides to read large deck cards for 2 laps, min guard assist for balance Ball pass at random heights on left/right sides with gait around    Standing next to counter top on red mat: Alternating lateral stepping out with same side reaching up to target<>back in. Had pt tracking hand with reaching. Min guard assist with cues to tracking, 10 reps each side. Alternating lateral stepping with same side reaching out to side to target with pt tracking hand, 10 reps each side. Alternating backward stepping with reach back while tracking hand for 10 reps each side.  Seated at edge of mat table with feet on airex: sit<>stands with cues for weight shifting, to increase base of support and for controlled descent with sitting back down.        PATIENT EDUCATION: Education details: Continue with current HEP Person educated: Patient Education method: Explanation Education comprehension: verbalized understanding    HOME EXERCISE  PROGRAM: Access Code: [email or web address], Standing PWR moves.      GOALS: Goals reviewed with patient? Yes  SHORT TERM GOALS: Target date: 11/05/2021  Pt will be independent with initial HEP for improved strength, balance, transfers and gait. Baseline: Has HEP from previous bout of therapy, needs to be reviewed  Goal status: INITIAL  2.  Pt will verbalize and demonstrate fall prevention strategies in the home and community for reduced fall risk and frequency Baseline:  Goal status: INITIAL  3.  Pt will improve cog TUG to less than or equal to 11 seconds for improved functional mobility and decreased fall risk. Baseline: 13.25s  Goal status: INITIAL  4.  Pt will improve 5 x STS to less than or equal to 13 seconds without UE support and absent retropulsion to demonstrate improved functional strength and transfer efficiency.  Baseline: 16.15s w/retropulsion  Goal status: INITIAL  LONG TERM GOALS: Target date: 12/03/2021  Pt will be independent with final HEP for improved strength, balance, transfers and gait. Baseline:  Goal status: INITIAL  2.  Pt will verbalize understanding of local PD community resources, including fitness post DC.  Baseline:  Goal status: INITIAL  3.  Pt will improve MiniBest to 21/28 for decreased fall risk and improvement with compensatory stepping strategies.  Baseline: 16/28 Goal status: INITIAL   ASSESSMENT:  CLINICAL IMPRESSION: Today's skilled session continued to focus on strengthening, activity tolerance and balance training with emphasis on head movements/scanning environment. No issues noted or reported this session. The pt is making steady progress toward goals and should benefit from continued PT to progress toward unmet goals.   OBJECTIVE IMPAIRMENTS decreased activity tolerance, decreased balance, decreased cognition, decreased coordination, decreased knowledge of condition, decreased knowledge of use of DME, decreased mobility, difficulty walking,  decreased strength, decreased safety awareness, impaired perceived functional ability, impaired vision/preception, and improper body mechanics.    ACTIVITY LIMITATIONS carrying, lifting, bending, sitting, standing, squatting, stairs, transfers, and locomotion level   PARTICIPATION LIMITATIONS: meal prep, cleaning, laundry, medication management, driving, shopping, community activity, and yard work   PERSONAL FACTORS Behavior pattern, Fitness, Transportation, and 1 comorbidity: lack of support in home environment  are also affecting patient's functional outcome.    REHAB POTENTIAL:  Good   CLINICAL DECISION MAKING: Evolving/moderate complexity   EVALUATION COMPLEXITY: Moderate  PLAN: PT FREQUENCY: 2x/week  PT DURATION: 8 weeks  PLANNED INTERVENTIONS: Therapeutic exercises, Therapeutic activity, Neuromuscular re-education, Balance training, Gait training, Patient/Family education, Stair training, DME instructions, Manual therapy, and Re-evaluation  PLAN FOR NEXT SESSION:  sit <>stands, Visual scanning. Stepping strategies. Balance and SLS tasks. Try standing PWR moves on compliant surfaces.    Willow Ora, PTA, [facility] [address], [place], [place] [address] [phone] 10/30/21, 10:00 AM

## 2021-10-29 NOTE — Patient Instructions (Signed)
Coordination Exercises  Perform the following exercises for 20 minutes 1 times per day. Perform with both hand(s). Perform using big movements.  Flipping Cards: Place deck of cards on the table. Flip cards over by opening your hand big to grasp and then turn your palm up big. Deal cards: Hold 1/2 or whole deck in your hand. Use thumb to push card off top of deck with one big push. Rotate ball with fingertips: Pick up with fingers/thumb and move as much as you can with each turn/movement (clockwise and counter-clockwise). Toss ball from one hand to the other: Toss big/high. Pick up coins and stack one at a time: Pick up with big, intentional movements. Do not drag coin to the edge. (5-10 in a stack) Pick up 5-10 coins one at a time and hold in palm. Then, move coins from palm to fingertips one at time and place in coin bank/container. Practice writing: Slow down, write big, and focus on forming each letter. Practice typing. Perform "Flicks"/hand stretches (PWR! Hands): Close hands then flick out your fingers with focus on opening hands, pulling wrists back, and extending elbows like you are pushing.

## 2021-10-29 NOTE — Therapy (Signed)
OUTPATIENT SPEECH LANGUAGE PATHOLOGY TREATMENT NOTE   Patient Name: April Larson MRN: [id number] DOB:[birth year], 71 y.o., female Today's Date: 10/29/2021  PCP: Glendale Chard, MD REFERRING PROVIDER: [name], MD   END OF SESSION:   End of Session - 10/29/21 1026     Visit Number 7    Number of Visits 17    Date for SLP Re-Evaluation 12/04/21    Authorization Type [employer] Medicare    Authorization Time Period $20 co pay per day    SLP Start Time 1145    SLP Stop Time  97    SLP Time Calculation (min) 45 min    Activity Tolerance Patient tolerated treatment well               Past Medical History:  Diagnosis Date   Diabetes mellitus without complication ([place])    Hypertension    Substance abuse ([place])    Past Surgical History:  Procedure Laterality Date   EYE SURGERY     Patient Active Problem List   Diagnosis Date Noted   Decreased dorsalis pedis pulse 11/17/2020   Tachycardia 08/11/2020   Cough 08/11/2020   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   Pure hypercholesterolemia 01/23/2020   Vitamin D deficiency 10/23/2019   Right hip pain 05/15/2018   Estrogen deficiency 05/15/2018   Class 1 obesity due to excess calories with serious comorbidity and body mass index (BMI) of 31.0 to 31.9 in adult 05/15/2018   Diabetes mellitus with stage 2 chronic kidney disease ([place]) 01/11/2018   Benign hypertension with chronic kidney disease, stage II 01/11/2018    ONSET DATE: PD dx 2021; 08/25/2021 (referral date)  REFERRING DIAG: R47.1ICD-10-CM Dysarthria and anarthria  R41.841ICD-10-CM Cognitive communication deficit  R13.10ICD-10-CM Dysphagia, unspecified type   THERAPY DIAG:  Cognitive communication deficit  Dysarthria and anarthria  Rationale for Evaluation and Treatment Rehabilitation  SUBJECTIVE: "I cleaned off my dresser"  PAIN:  Are you having pain? No   OBJECTIVE:  TODAY'S TREATMENT:  10-29-21: Pt completed HEP with success.  Educated and instructed recommended attention and memory compensations to aid auditory processing, accuracy of task completion, and functional recall. Pt benefits from focusing on one task at time, repetition of information, and slower speech. Discussed upcoming return to work, in which pt able to ID possible challenges with occasional prompting.   10-27-21: Pt reported fatigue after working at [employer] job last night. Denied any cognitive or communicative challenges for part-time work directing guests to seats. Success with time management and establishment of new routines reported by patient and sister. Continues to deny any recent word finding episodes or difficulties. Some reduced recall exhibited in relation to family assisting with organizing her home (sister prompted recall of plan to put dog pad on porch and put mail in stand up versus drawer). SLP educated and instructed memory compensations to aid recall, such as repetition and writing down information to aid patient comprehension and carryover of new procedures. Pt exhibited adequate delayed recall of new procedures with mild prompting and cues. Discussed additional ways to organize house to aid attention and recall of personal items.   10-22-21: Recalled prior therapy activities and able to ID strategies to implement (visual scanning). Completed HEP to ID solution to optimize time management, in which pt identified need to wake up earlier. Successful implementation of solution and improved time management reported this morning. Time management worksheet completed independently was 50% accurate, which improved to 95% accurate with SLP re-reading question aloud. Educated patient on attention, recall,  and comprehension strategies, such as monotasking, using external recall aids, re-reading to ensure comprehension, and double checking for error awareness. No overt word retrieval difficulty reported recently. SLP assisted patient with self-recognition of  variations in volume (too loud versus too soft).   10-20-21: Reported she lost hwk from last session. Targeted functional time management tasks today, in which pt completed with rare min A and benefited from cued double check. Discussed targeted cognitive communication strategies from last session, in which pt able to recall with mod I. Some increased awareness exhibited, in which pt used retrospective analysis to determine event x1 in which "stop and think" would have benefited her. Pt alerted sister of self-advocacy plan if patient is not understanding or needs repetition. Targeted functional time calculations for her morning routine to aid problem solving and attention for prepared arrival of transportation. Pt able to identify she needed more time to realistically complete her morning routine. Requested pt ID solutions for next ST session.   10-15-21: Pt reportedly completed personal goal to organize dining room table in order to eat there at mealtimes for increased safety. Improved swallow function reported with use of recommended techniques (see previous note). Pt reported a fall following last therapy sessions while attempting to walk into her house holding items in both hands. Pt denied any cognitive or physical changes s/p fall despite endorsing hitting her head. SLP recommended ongoing monitoring in case sx arise. SLP educated and trained technique to aid processing and problem solving, with pt implementing intentional pause to process and plan prior to execution of task. Discussed importance of initiating this technique as thought processing has slowed and reduced safety awareness demonstrated. Targeted time management task, in which pt required usual repetition/rephrasing to optimize comprehension of auditory question x1/4. SLP recommended pt self-advocate if she is not understanding or attending in conversation, with example phrases provided to aid self-advocacy. No overt dysnomia or anomia exhibited  in conversation today. Pt was noted to repeat therapist very frequently and attempt to fill in answers.    PATIENT EDUCATION: Education details: see above Person educated: Patient Education method: Customer service manager Education comprehension: verbalized understanding, returned demonstration, and needs further education     GOALS: Goals reviewed with patient? Yes   SHORT TERM GOALS: Target date: 11/05/2021    Pt will complete clinical swallow assessment in first 1-2 ST sessions and pt will complete MBSS as needed for additional assessment per SLP recommendation  Baseline: Goal status: Met (consider future MBSS referral pending implementation of swallow precautions)   2.  Pt will utilize dysarthria compensations to optimize vocal intensity and clarity in short structured conversations given occasional mod A over 2 sessions Baseline: 10-29-21 Goal status: ongoing   3.  Pt will utilize word retrieval strategies to aid anomia/dysnomia on structured speech tasks given occasional mod A over 2 sessions Baseline:  Goal status: ongoing (not reported over last 2 sessions)   4.  Pt will implement attention/processing strategies in short structured conversations to aid patient engagement given occasional mod A over 2 sessions  Baseline: 10-22-21, 10-29-21 Goal status: Met   5.  Pt will implement memory compensations to optimize performance at work and home given occasional mod A over 2 sessions Baseline: 10-22-21, 10-29-21 Goal status: Met   6.  Family will utilize techniques to aid patient processing speed and comprehension given occasional min A over 2 sessions  Baseline: 10-22-21, 10-27-21 Goal status: Met    LONG TERM GOALS: Target date: 12/04/2021  Pt will implement attention/processing strategies in 10-15 minute conversations to aid patient engagement given occasional min A over 2 sessions  Baseline:  Goal status: ongoing   2.  Pt will demonstrate ability to ID and  correct/compensate for anomia/dysnomia in 10-15 minute conversations given occasional min A over 2 sessions Baseline:  Goal status: ongoing   3.  Pt will implement memory compensations to optimize performance at work and home given occasional min A over 2 sessions Baseline:  Goal status: ongoing   4.  Family will utilize techniques to aid patient processing speed and comprehension given rare min A over 2 sessions  Baseline:  Goal status: ongoing   5.  Pt will consume safest and least restrictive diet with reduced s/sx of aspiration with use of trained techniques as needed over 2 sessions Baseline:  Goal status: ongoing   ASSESSMENT:   CLINICAL IMPRESSION: Patient is a 71 y.o. 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 Targeted cognitive communication strategies to implement at home and work to aid cognitive functioning (processing, recall, attention) with support from family requested to aid carryover. Good carryover of recommended SLP techniques and strategies reported thus far. Pt would continue to benefit from skilled ST intervention to optimize functional independence and safety.    OBJECTIVE IMPAIRMENTS  Objective impairments include attention, memory, executive functioning, expressive language, dysarthria, and dysphagia. These impairments are limiting patient from household responsibilities, ADLs/IADLs, effectively communicating at home and in community, and safety when swallowing.Factors affecting potential to achieve goals and functional outcome are ability to learn/carryover information, cooperation/participation level, medical prognosis, and previous level of function. Patient will benefit from skilled SLP services to address above impairments and improve overall function.   REHAB POTENTIAL: Fair - hx of reduced carryover of HEP   PLAN: SLP FREQUENCY: 2x/week   SLP DURATION: 8 weeks   PLANNED INTERVENTIONS: Language facilitation, Environmental controls,  Cueing hierachy, Cognitive reorganization, Internal/external aids, Functional tasks, Multimodal communication approach, SLP instruction and feedback, Compensatory strategies, and Patient/family education     Marzetta Board, CCC-SLP 10/29/2021, 10:26 AM

## 2021-10-29 NOTE — Therapy (Signed)
OUTPATIENT OCCUPATIONAL THERAPY PARKINSON'S Treatment  Patient Name: April Larson MRN: [id number] DOB:1951/03/09, 71 y.o., female Today's Date: 10/29/2021  PCP: Glendale Chard, MD REFERRING PROVIDER: [name], MD    OT End of Session - 10/29/21 1121     Visit Number 7    Number of Visits 17    Date for OT Re-Evaluation 12/03/21    Authorization Type [employer] MCR    Authorization - Visit Number 7    Progress Note Due on Visit 10    OT Start Time 1103    OT Stop Time 1143    OT Time Calculation (min) 40 min    Activity Tolerance Patient tolerated treatment well    Behavior During Therapy WFL for tasks assessed/performed                  Past Medical History:  Diagnosis Date   Diabetes mellitus without complication ([place])    Hypertension    Substance abuse ([place])    Past Surgical History:  Procedure Laterality Date   EYE SURGERY     Patient Active Problem List   Diagnosis Date Noted   Decreased dorsalis pedis pulse 11/17/2020   Tachycardia 08/11/2020   Cough 08/11/2020   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   Pure hypercholesterolemia 01/23/2020   Vitamin D deficiency 10/23/2019   Right hip pain 05/15/2018   Estrogen deficiency 05/15/2018   Class 1 obesity due to excess calories with serious comorbidity and body mass index (BMI) of 31.0 to 31.9 in adult 05/15/2018   Diabetes mellitus with stage 2 chronic kidney disease ([place]) 01/11/2018   Benign hypertension with chronic kidney disease, stage II 01/11/2018    ONSET DATE: 08/25/21 (referral date)   REFERRING DIAG: G20ICD-10-CM Primary parkinsonism ([place])   THERAPY DIAG:  Muscle weakness (generalized)  Unsteadiness on feet  Other symptoms and signs involving the nervous system  Abnormal posture  Other lack of coordination  Stiffness of right elbow, not elsewhere classified  Visuospatial deficit  Attention and concentration deficit  Rationale for Evaluation and Treatment  Rehabilitation  SUBJECTIVE:   SUBJECTIVE STATEMENT: Pt reports performing exercises with paper towel roll.  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 No  PATIENT GOALS walking and balance, handwriting  OBJECTIVE: Pt demo inability to track inferiorly, limited ability/difficulty to track superiorly, difficulty tracking to the L and significant limitation tracking to the R.  Pt appears to demo decr peripheral vision on both sides R>L.     TODAY'S TREATMENT:  Seated closed chain shoulder flexion with foam roll  for closed chain shoulder flexion, min v.c to watch the roll with head movements  Pt was instructed in coordination HEP, with big movements and pt watching hand to perform, min-mod v.c for amplitude and watching hand Printing with verbal cues to pause between each letter. Pt with improved legibility when printing.  PATIENT EDUCATION: Education details: closed chain shoulder flexion, coordination HEP Person educated: Patient, brother Education method: Explanation, Surveyor, mining Education comprehension: verbalized understanding (returned demo of exercises)   HOME EXERCISE PROGRAM:  Vision HEP 10/27/21-Closed chain shoulder flexion with paper towel roll 10/29/21-coordination HEP   GOALS: Goals reviewed with patient? Yes  SHORT TERM GOALS: Target date: 11/05/2021   Independent w/ modified PD HEP (? Bag ex's, focus on ROM, trunk) Baseline: Goal status: INITIAL  2.  Pt to verbalize understanding w/ safety  and fall prevention techniques including DME for showering Baseline:  Goal status: INITIAL  3.  Pt to improve standing functional reach test to 10" or greater Rt side Baseline: 9" (Lt = 12")  Goal status: INITIAL  4.  Pt to verbalize understanding with handwriting strategies and demo minimal micrographia for 3  sentences Baseline:  Goal status: INITIAL  5.  Pt will verbalize understanding of adaptive strategies to increase ease/efficiency and safety w/ ADLS/IADLS Baseline:  Goal status: INITIAL  6.  Pt to improve Rt hand coordination as evidenced by performing 9 hole peg test in 35 sec or less Baseline: Rt = 40 sec (Lt = 28 sec)  Goal status: INITIAL  LONG TERM GOALS: Target date: 12/03/2021    Pt will verbalize understanding of ways to prevent future related complications and appropriate community resources prn Baseline:  Goal status: INITIAL  2.  Pt will verbalize understanding of cognitive compensations Baseline:  Goal status: INITIAL  3.  Pt will demo visual compensations and safely navigate environment w/ 80% accuracy in environmental scanning Baseline:  Goal status: INITIAL  4.  Pt will improve button/unbutton test to 50 sec or less Baseline: 56 sec Goal status: INITIAL  5.  Pt demo full reaching RUE w/ elbow ext to -20* or less Baseline: -30* Goal status: INITIAL   ASSESSMENT:  CLINICAL IMPRESSION:Pt is progressing towards goals. She remains limited by visual deficits. She demonstrates understanding of HEP for coordination.  PERFORMANCE DEFICITS in functional skills including ADLs, IADLs, coordination, dexterity, proprioception, ROM, FMC, mobility, balance, body mechanics, endurance, decreased knowledge of precautions, decreased knowledge of use of DME, vision, and UE functional use, cognitive skills including energy/drive, memory, perception, problem solving, safety awareness, and understand, and psychosocial skills including coping strategies.   IMPAIRMENTS are limiting patient from ADLs, IADLs, work, and social participation.   COMORBIDITIES may have co-morbidities  that affects occupational performance. Patient will benefit from skilled OT to address above impairments and improve overall function.  MODIFICATION OR ASSISTANCE TO COMPLETE EVALUATION: No modification of  tasks or assist necessary to complete an evaluation.  OT OCCUPATIONAL PROFILE AND HISTORY: Detailed assessment: Review of records and additional review of physical, cognitive, psychosocial history related to current functional performance.  CLINICAL DECISION MAKING: Moderate - several treatment options, min-mod task modification necessary  REHAB POTENTIAL: Fair impaired vision and cognition  EVALUATION COMPLEXITY: Moderate  PLAN: OT FREQUENCY: 2x/week  OT DURATION: 8 weeks, plus eval  PLANNED INTERVENTIONS: self care/ADL training, therapeutic exercise, therapeutic activity, neuromuscular re-education, passive range of motion, balance training, functional mobility training, patient/family education, cognitive remediation/compensation, visual/perceptual remediation/compensation, coping strategies training, DME and/or AE instructions, and Re-evaluation  RECOMMENDED OTHER SERVICES: ? Driving eval, resources for Assisted living  CONSULTED AND AGREED WITH PLAN OF CARE: Patient  PLAN FOR NEXT SESSION:  work towards goals, check STG's next week   Michaelpaul Apo, OTR/L 10/29/2021, 11:32 AM  Theone Murdoch, OTR/L [phone] Phone: [phone] 11:32 AM 10/29/21

## 2021-10-30 NOTE — Progress Notes (Signed)
[facility] PREP Weekly Session  Patient Details  Name: April Larson MRN: [id number] Date of Birth: 03-09-1951 Age: 71 y.o. PCP: Glendale Chard, MD  Vitals:   10/27/21 1800  Weight: 167 lb 3.2 oz (75.8 kg)     [facility] Weekly seesion - 10/30/21 1100       [facility] "PREP" Location   [facility] "PREP" Location [facility]      Weekly Session   Topic Discussed Health habits    Minutes exercised this week 20 minutes    Classes attended to date [place] 10/30/2021, 11:26 AM

## 2021-11-02 DIAGNOSIS — E1122 Type 2 diabetes mellitus with diabetic chronic kidney disease: Secondary | ICD-10-CM | POA: Diagnosis not present

## 2021-11-02 DIAGNOSIS — I129 Hypertensive chronic kidney disease with stage 1 through stage 4 chronic kidney disease, or unspecified chronic kidney disease: Secondary | ICD-10-CM | POA: Diagnosis not present

## 2021-11-02 DIAGNOSIS — E7800 Pure hypercholesterolemia, unspecified: Secondary | ICD-10-CM | POA: Diagnosis not present

## 2021-11-02 DIAGNOSIS — N182 Chronic kidney disease, stage 2 (mild): Secondary | ICD-10-CM | POA: Diagnosis not present

## 2021-11-03 ENCOUNTER — Ambulatory Visit: Payer: Medicare HMO | Attending: Internal Medicine

## 2021-11-03 ENCOUNTER — Ambulatory Visit: Payer: Medicare HMO | Admitting: Occupational Therapy

## 2021-11-03 ENCOUNTER — Telehealth: Payer: Self-pay | Admitting: Neurology

## 2021-11-03 ENCOUNTER — Ambulatory Visit: Payer: Medicare HMO | Admitting: Physical Therapy

## 2021-11-03 DIAGNOSIS — R278 Other lack of coordination: Secondary | ICD-10-CM | POA: Diagnosis not present

## 2021-11-03 DIAGNOSIS — R2681 Unsteadiness on feet: Secondary | ICD-10-CM | POA: Insufficient documentation

## 2021-11-03 DIAGNOSIS — R41841 Cognitive communication deficit: Secondary | ICD-10-CM | POA: Insufficient documentation

## 2021-11-03 DIAGNOSIS — R29818 Other symptoms and signs involving the nervous system: Secondary | ICD-10-CM | POA: Diagnosis not present

## 2021-11-03 DIAGNOSIS — M25621 Stiffness of right elbow, not elsewhere classified: Secondary | ICD-10-CM | POA: Diagnosis not present

## 2021-11-03 DIAGNOSIS — R41840 Attention and concentration deficit: Secondary | ICD-10-CM

## 2021-11-03 DIAGNOSIS — M6281 Muscle weakness (generalized): Secondary | ICD-10-CM

## 2021-11-03 DIAGNOSIS — R293 Abnormal posture: Secondary | ICD-10-CM | POA: Diagnosis not present

## 2021-11-03 DIAGNOSIS — R41842 Visuospatial deficit: Secondary | ICD-10-CM

## 2021-11-03 DIAGNOSIS — R471 Dysarthria and anarthria: Secondary | ICD-10-CM | POA: Insufficient documentation

## 2021-11-03 DIAGNOSIS — R2689 Other abnormalities of gait and mobility: Secondary | ICD-10-CM | POA: Diagnosis not present

## 2021-11-03 DIAGNOSIS — R1310 Dysphagia, unspecified: Secondary | ICD-10-CM | POA: Insufficient documentation

## 2021-11-03 NOTE — Therapy (Signed)
OUTPATIENT PHYSICAL THERAPY NEURO TREATMENT   Patient Name: April Larson MRN: [id number] DOB:[birth year], 71 y.o., female Today's Date: 11/03/2021   PCP: Glendale Chard, MD REFERRING PROVIDER: [name], MD    PT End of Session - 11/03/21 1105     Visit Number 8    Number of Visits 17   Plus eval   Date for PT Re-Evaluation 12/10/21    Authorization Type A[employer]na Medicare    Progress Note Due on Visit 10    PT Start Time 1105    PT Stop Time 1145    PT Time Calculation (min) 40 min    Equipment Utilized During Treatment --    Activity Tolerance Patient tolerated treatment well    Behavior During Therapy WFL for tasks assessed/performed               Past Medical History:  Diagnosis Date   Diabetes mellitus without complication ([place])    Hypertension    Substance abuse ([place])    Past Surgical History:  Procedure Laterality Date   EYE SURGERY     Patient Active Problem List   Diagnosis Date Noted   Decreased dorsalis pedis pulse 11/17/2020   Tachycardia 08/11/2020   Cough 08/11/2020   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   Pure hypercholesterolemia 01/23/2020   Vitamin D deficiency 10/23/2019   Right hip pain 05/15/2018   Estrogen deficiency 05/15/2018   Class 1 obesity due to excess calories with serious comorbidity and body mass index (BMI) of 31.0 to 31.9 in adult 05/15/2018   Diabetes mellitus with stage 2 chronic kidney disease ([place]) 01/11/2018   Benign hypertension with chronic kidney disease, stage II 01/11/2018    ONSET DATE: 08/25/2021 (referral)  REFERRING DIAG: R47.1 (ICD-10-CM) - Dysarthria and anarthria R41.841 (ICD-10-CM) - Cognitive communication deficit R13.10 (ICD-10-CM) - Dysphagia, unspecified type R27.8 (ICD-10-CM) - Other lack of coordination R26.81 (ICD-10-CM) - Unsteadiness on feet R29.3 (ICD-10-CM) - Abnormal posture G20 (ICD-10-CM) - Primary parkinsonism ([place])   THERAPY DIAG:  Unsteadiness on feet  Other lack of  coordination  Muscle weakness (generalized)  Rationale for Evaluation and Treatment Rehabilitation  PERTINENT HISTORY: HTN, DM, high chol, CKD, Supranuclear Palsy  PRECAUTIONS: Fall  PATIENT GOALS "I wanna work on walking"   OBJECTIVE:   COGNITION: Overall cognitive status: No family/caregiver present to determine baseline cognitive functioning   SUBJECTIVE: Pt denies falls or any changes since last session. Pt reports her HEP is "good", states she is doing something every day.  PAIN:  Are you having pain? No    TODAY'S TREATMENT:  NMR  In // bars for improved reaching out of BOS, dynamic balance, set-switching and stepping strategy: -Lateral cone reach and stack without UE support, stacking 8 cones per side. Min A for retropulsion correction while rotating to R side  -Progressed to lateral cone reach and stack while standing on [employer], x8 cones per side. Continued min A for retropulsion correction when rotation to R side. Noted difficulty finding cones on L side.  -Standing on foam without UE support w/EC and wide BOS, 2x30s. Progressed to narrow BOS w/ EC, 2x30s. "I do not like this foam thing". Noted appropriate A/P sway and lateral lean to L side to brace against // bars to maintain balance.  -Alt fwd lunge over black foam beam (2") w/OH ball slam using 1kg ball. Added yellow dot on ground for visual target to step to. Pt required mod-max multimodal cues throughout to maintain "step, throw" sequence and to perform high-amplitude ball throw from [facility]. Pt unable to maintain reciprocal sequence or throw ball from [facility] without cues and frequently dropped the ball rather than slamming. Noted poor selective attention, so added cog dual-task to improve sustained attention to task (naming foods w/each ball  throw). Note improved sequencing w/dual-task but pt frequently forgetting what to name. Noted decreased step clearance w/RLE throughout and lateral lean to L side to balance. Min guard throughout due to decreased step clearance.    Ther Ex  SciFit multi-peaks level 6 for 8 minutes using BUE/BLEs for neural priming for reciprocal movement, dynamic cardiovascular conditioning and increased amplitude of stepping. RPE of 9/10 following activity     PATIENT EDUCATION: Education details: Continue with current HEP Person educated: Patient Education method: Explanation Education comprehension: verbalized understanding    HOME EXERCISE PROGRAM: Access Code: [email or web address], Standing PWR moves.      GOALS: Goals reviewed with patient? Yes  SHORT TERM GOALS: Target date: 11/05/2021  Pt will be independent with initial HEP for improved strength, balance, transfers and gait. Baseline: Has HEP from previous bout of therapy, needs to be reviewed  Goal status: INITIAL  2.  Pt will verbalize and demonstrate fall prevention strategies in the home and community for reduced fall risk and frequency Baseline:  Goal status: INITIAL  3.  Pt will improve cog TUG to less than or equal to 11 seconds for improved functional mobility and decreased fall risk. Baseline: 13.25s  Goal status: INITIAL  4.  Pt will improve 5 x STS to less than or equal to 13 seconds without UE support and absent retropulsion to demonstrate improved functional strength and transfer efficiency.  Baseline: 16.15s w/retropulsion  Goal status: INITIAL  LONG TERM GOALS: Target date: 12/03/2021  Pt will be independent with final HEP for improved strength, balance, transfers and gait. Baseline:  Goal status: INITIAL  2.  Pt will verbalize understanding of local PD community resources, including fitness post DC.  Baseline:  Goal status: INITIAL  3.  Pt will improve MiniBest to 21/28 for decreased fall risk and improvement with  compensatory stepping strategies.  Baseline: 16/28 Goal status: INITIAL   ASSESSMENT:  CLINICAL IMPRESSION: Emphasis of skilled PT session on reactive balance strategies and dual-tasking. Pt demonstrates very poor peripheral visual scanning and selective attention, requiring mod-max multimodal cues throughout session to perform interventions. Pt frequently experienced retropulsion when rotating to R side and relies heavily on lateral lean to L to maintain balance. Continue POC.   OBJECTIVE IMPAIRMENTS  decreased activity tolerance, decreased balance, decreased cognition, decreased coordination, decreased knowledge of condition, decreased knowledge of use of DME, decreased mobility, difficulty walking, decreased strength, decreased safety awareness, impaired perceived functional ability, impaired vision/preception, and improper body mechanics.    ACTIVITY LIMITATIONS carrying, lifting, bending, sitting, standing, squatting, stairs, transfers, and locomotion level   PARTICIPATION LIMITATIONS: meal prep, cleaning, laundry, medication management, driving, shopping, community activity, and yard work   PERSONAL FACTORS Behavior pattern, Fitness, Transportation, and 1 comorbidity: lack of support in home environment  are also affecting patient's functional outcome.    REHAB POTENTIAL: Good   CLINICAL DECISION MAKING: Evolving/moderate complexity   EVALUATION COMPLEXITY: Moderate  PLAN: PT FREQUENCY: 2x/week  PT DURATION: 8 weeks  PLANNED INTERVENTIONS: Therapeutic exercises, Therapeutic activity, Neuromuscular re-education, Balance training, Gait training, Patient/Family education, Stair training, DME instructions, Manual therapy, and Re-evaluation  PLAN FOR NEXT SESSION:  Goal assessment, sit <>stands, Visual scanning. Stepping strategies. Balance and SLS tasks. Try standing PWR moves on compliant surfaces.    Cruzita Lederer Kol Consuegra, PT, DPT 11/03/21, 11:49 AM

## 2021-11-03 NOTE — Therapy (Signed)
OUTPATIENT SPEECH LANGUAGE PATHOLOGY TREATMENT NOTE   Patient Name: April Larson MRN: [id number] DOB:Jan 25, 1951, 71 y.o., female Today's Date: 11/03/2021  PCP: Glendale Chard, MD REFERRING PROVIDER: [name], MD   END OF SESSION:   End of Session - 11/03/21 1017     Visit Number 8    Number of Visits 17    Date for SLP Re-Evaluation 12/04/21    Authorization Type [employer]    Authorization Time Period $20 co pay per day    SLP Start Time 1145    SLP Stop Time  74    SLP Time Calculation (min) 45 min    Activity Tolerance Patient tolerated treatment well                Past Medical History:  Diagnosis Date   Diabetes mellitus without complication ([place])    Hypertension    Substance abuse ([place])    Past Surgical History:  Procedure Laterality Date   EYE SURGERY     Patient Active Problem List   Diagnosis Date Noted   Decreased dorsalis pedis pulse 11/17/2020   Tachycardia 08/11/2020   Cough 08/11/2020   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   Pure hypercholesterolemia 01/23/2020   Vitamin D deficiency 10/23/2019   Right hip pain 05/15/2018   Estrogen deficiency 05/15/2018   Class 1 obesity due to excess calories with serious comorbidity and body mass index (BMI) of 31.0 to 31.9 in adult 05/15/2018   Diabetes mellitus with stage 2 chronic kidney disease ([place]) 01/11/2018   Benign hypertension with chronic kidney disease, stage II 01/11/2018    ONSET DATE: PD dx 2021; 08/25/2021 (referral date)  REFERRING DIAG: R47.1ICD-10-CM Dysarthria and anarthria  R41.841ICD-10-CM Cognitive communication deficit  R13.10ICD-10-CM Dysphagia, unspecified type   THERAPY DIAG:  Cognitive communication deficit  Dysarthria and anarthria  Dysphagia, unspecified type  Rationale for Evaluation and Treatment Rehabilitation  SUBJECTIVE: "just tired"   PAIN:  Are you having pain? No   OBJECTIVE:  TODAY'S TREATMENT:  11-03-21: Pt continues with  excellent carryover of SLP recommendations and compensations to assist with cognitive linguistic functioning and swallow functioning. Sister provided positive feedback of patient carryover of trained techniques. No overt s/sx of aspiration reported with PO intake with implementation of SLP recommendations. Discussed possible challenges related to work, in which pt endorsed feeling "scattered" sometimes related to becoming distracted. Pt able to ID need to eliminate distractions by closing her office door. SLP also recommended only working for length of attention span (pt approximated 15-30 minutes) and taking a cognitive breaks as needed. Targeted alternating attention task for typing work related information, in which pt able to ID typing errors with good accuracy and timeliness. Occasional min A required to ID 1/2 possible errors with provided typing material. Pt denied any recent anomia/dysnomia.   10-29-21: Pt completed HEP with success. Educated and instructed recommended attention and memory compensations to aid auditory processing, accuracy of task completion, and functional recall. Pt benefits from focusing on one task at time, repetition of information, and slower speech. Discussed upcoming return to work, in which pt able to ID possible challenges with occasional prompting.   10-27-21: Pt reported fatigue after working at [employer] job last night. Denied any cognitive or communicative challenges for part-time work directing guests to seats. Success with time management and establishment of new routines reported by patient and sister. Continues to deny any recent word finding episodes or difficulties. Some reduced recall exhibited in relation to family assisting with organizing her  home (sister prompted recall of plan to put dog pad on porch and put mail in stand up versus drawer). SLP educated and instructed memory compensations to aid recall, such as repetition and writing down information to aid patient  comprehension and carryover of new procedures. Pt exhibited adequate delayed recall of new procedures with mild prompting and cues. Discussed additional ways to organize house to aid attention and recall of personal items.   10-22-21: Recalled prior therapy activities and able to ID strategies to implement (visual scanning). Completed HEP to ID solution to optimize time management, in which pt identified need to wake up earlier. Successful implementation of solution and improved time management reported this morning. Time management worksheet completed independently was 50% accurate, which improved to 95% accurate with SLP re-reading question aloud. Educated patient on attention, recall, and comprehension strategies, such as monotasking, using external recall aids, re-reading to ensure comprehension, and double checking for error awareness. No overt word retrieval difficulty reported recently. SLP assisted patient with self-recognition of variations in volume (too loud versus too soft).   10-20-21: Reported she lost hwk from last session. Targeted functional time management tasks today, in which pt completed with rare min A and benefited from cued double check. Discussed targeted cognitive communication strategies from last session, in which pt able to recall with mod I. Some increased awareness exhibited, in which pt used retrospective analysis to determine event x1 in which "stop and think" would have benefited her. Pt alerted sister of self-advocacy plan if patient is not understanding or needs repetition. Targeted functional time calculations for her morning routine to aid problem solving and attention for prepared arrival of transportation. Pt able to identify she needed more time to realistically complete her morning routine. Requested pt ID solutions for next ST session.   10-15-21: Pt reportedly completed personal goal to organize dining room table in order to eat there at mealtimes for increased safety.  Improved swallow function reported with use of recommended techniques (see previous note). Pt reported a fall following last therapy sessions while attempting to walk into her house holding items in both hands. Pt denied any cognitive or physical changes s/p fall despite endorsing hitting her head. SLP recommended ongoing monitoring in case sx arise. SLP educated and trained technique to aid processing and problem solving, with pt implementing intentional pause to process and plan prior to execution of task. Discussed importance of initiating this technique as thought processing has slowed and reduced safety awareness demonstrated. Targeted time management task, in which pt required usual repetition/rephrasing to optimize comprehension of auditory question x1/4. SLP recommended pt self-advocate if she is not understanding or attending in conversation, with example phrases provided to aid self-advocacy. No overt dysnomia or anomia exhibited in conversation today. Pt was noted to repeat therapist very frequently and attempt to fill in answers.    PATIENT EDUCATION: Education details: see above Person educated: Patient Education method: Customer service manager Education comprehension: verbalized understanding, returned demonstration, and needs further education     GOALS: Goals reviewed with patient? Yes   SHORT TERM GOALS: Target date: 11/05/2021    Pt will complete clinical swallow assessment in first 1-2 ST sessions and pt will complete MBSS as needed for additional assessment per SLP recommendation  Baseline: Goal status: Met (consider future MBSS referral pending implementation of swallow precautions)   2.  Pt will utilize dysarthria compensations to optimize vocal intensity and clarity in short structured conversations given occasional mod A over 2 sessions Baseline: 10-29-21,  11-03-21 Goal status: Met   3.  Pt will utilize word retrieval strategies to aid anomia/dysnomia on structured  speech tasks given occasional mod A over 2 sessions Baseline:  Goal status: Deferred (not reported over last 4 sessions)   4.  Pt will implement attention/processing strategies in short structured conversations to aid patient engagement given occasional mod A over 2 sessions  Baseline: 10-22-21, 10-29-21 Goal status: Met   5.  Pt will implement memory compensations to optimize performance at work and home given occasional mod A over 2 sessions Baseline: 10-22-21, 10-29-21 Goal status: Met   6.  Family will utilize techniques to aid patient processing speed and comprehension given occasional min A over 2 sessions  Baseline: 10-22-21, 10-27-21 Goal status: Met    LONG TERM GOALS: Target date: 12/04/2021     Pt will implement attention/processing strategies in 10-15 minute conversations to aid patient engagement given occasional min A over 2 sessions  Baseline:  Goal status: ongoing   2.  Pt will demonstrate ability to ID and correct/compensate for anomia/dysnomia in 10-15 minute conversations given occasional min A over 2 sessions Baseline:  Goal status: ongoing   3.  Pt will implement memory compensations to optimize performance at work and home given occasional min A over 2 sessions Baseline:  Goal status: ongoing   4.  Family will utilize techniques to aid patient processing speed and comprehension given rare min A over 2 sessions  Baseline:  Goal status: ongoing   5.  Pt will consume safest and least restrictive diet with reduced s/sx of aspiration with use of trained techniques as needed over 2 sessions Baseline:  Goal status: ongoing   ASSESSMENT:   CLINICAL IMPRESSION: Patient is a 71 y.o. 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 Targeted cognitive communication strategies to implement at home and work to aid cognitive functioning (processing, recall, attention) with support from family requested to aid carryover. Good carryover of recommended SLP techniques  and strategies reported thus far. Pt would continue to benefit from skilled ST intervention to optimize functional independence and safety.    OBJECTIVE IMPAIRMENTS  Objective impairments include attention, memory, executive functioning, expressive language, dysarthria, and dysphagia. These impairments are limiting patient from household responsibilities, ADLs/IADLs, effectively communicating at home and in community, and safety when swallowing.Factors affecting potential to achieve goals and functional outcome are ability to learn/carryover information, cooperation/participation level, medical prognosis, and previous level of function. Patient will benefit from skilled SLP services to address above impairments and improve overall function.   REHAB POTENTIAL: Fair - hx of reduced carryover of HEP   PLAN: SLP FREQUENCY: 2x/week   SLP DURATION: 8 weeks   PLANNED INTERVENTIONS: Language facilitation, Environmental controls, Cueing hierachy, Cognitive reorganization, Internal/external aids, Functional tasks, Multimodal communication approach, SLP instruction and feedback, Compensatory strategies, and Patient/family education     Marzetta Board, CCC-SLP 11/03/2021, 11:47 AM

## 2021-11-03 NOTE — Therapy (Signed)
OUTPATIENT OCCUPATIONAL THERAPY PARKINSON'S Treatment  Patient Name: April Larson MRN: [id number] DOB:1950-07-07, 71 y.o., female Today's Date: 11/03/2021  PCP: Dorothyann Peng, MD REFERRING PROVIDER: Huston Foley, MD    OT End of Session - 11/03/21 1047     Visit Number 8    Number of Visits 17    Date for OT Re-Evaluation 12/03/21    Authorization Type Aetna MCR    Authorization - Visit Number 8    Progress Note Due on Visit 10    OT Start Time 1017    OT Stop Time 1057    OT Time Calculation (min) 40 min    Activity Tolerance Patient tolerated treatment well    Behavior During Therapy WFL for tasks assessed/performed                   Past Medical History:  Diagnosis Date   Diabetes mellitus without complication ([facility])    Hypertension    Substance abuse ([facility])    Past Surgical History:  Procedure Laterality Date   EYE SURGERY     Patient Active Problem List   Diagnosis Date Noted   Decreased dorsalis pedis pulse 11/17/2020   Tachycardia 08/11/2020   Cough 08/11/2020   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   Pure hypercholesterolemia 01/23/2020   Vitamin D deficiency 10/23/2019   Right hip pain 05/15/2018   Estrogen deficiency 05/15/2018   Class 1 obesity due to excess calories with serious comorbidity and body mass index (BMI) of 31.0 to 31.9 in adult 05/15/2018   Diabetes mellitus with stage 2 chronic kidney disease ([facility]) 01/11/2018   Benign hypertension with chronic kidney disease, stage II 01/11/2018    ONSET DATE: 08/25/21 (referral date)   REFERRING DIAG: G20ICD-10-CM Primary parkinsonism ([facility])   THERAPY DIAG:  Muscle weakness (generalized)  Unsteadiness on feet  Other symptoms and signs involving the nervous system  Abnormal posture  Other lack of coordination  Stiffness of right elbow, not elsewhere classified  Visuospatial deficit  Attention and concentration deficit  Rationale for Evaluation and Treatment  Rehabilitation  SUBJECTIVE:   SUBJECTIVE STATEMENT: Pt reports performing exercises with paper towel roll.  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 No  PATIENT GOALS walking and balance, handwriting  OBJECTIVE: Pt demo inability to track inferiorly, limited ability/difficulty to track superiorly, difficulty tracking to the L and significant limitation tracking to the R.  Pt appears to demo decr peripheral vision on both sides R>L.     TODAY'S TREATMENT:  Seated closed chain shoulder flexion with ball for closed chain shoulder flexion, min v.c to watch the roll with head movements. Therapist checked progress towards goals. Pt was accompanied by her sister Pt's sister reports that tub bench is arriving today. Pt has a device similar to lifeline, she was encouraged to wear it. Handwriting task with pt demonstrating ability to print 4 sentences with no significant decrease in letter size and good legibility. Arm bike x 6 mins level 1 for conditioning min v.c for speed, pt achieved 40 rpm   PATIENT EDUCATION: N/A   HOME EXERCISE PROGRAM:  Vision HEP 10/27/21-Closed chain shoulder flexion with paper towel roll 10/29/21-coordination HEP   GOALS: Goals reviewed with patient? Yes  SHORT TERM GOALS: Target date: 11/05/2021   Independent w/ modified PD HEP (? Bag ex's, focus on ROM, trunk) Baseline: Goal status: MET  2.  Pt to verbalize understanding w/ safety and fall prevention techniques including DME for showering Baseline:  Goal status: MET  3.  Pt to improve standing functional reach test to 10" or greater Rt side Baseline: 9" (Lt = 12")  Goal status: INITIAL  4.  Pt to verbalize understanding with handwriting strategies and demo minimal micrographia for 3 sentences Baseline:  Goal status: MET Pt demonstrates ability to perform  5.  Pt  will verbalize understanding of adaptive strategies to increase ease/efficiency and safety w/ ADLS/IADLS Baseline:  Goal status:ongoing  6.  Pt to improve Rt hand coordination as evidenced by performing 9 hole peg test in 35 sec or less Baseline: Rt = 40 sec (Lt = 28 sec)  Goal status: ongoing, improved but not met 36.72  LONG TERM GOALS: Target date: 12/03/2021    Pt will verbalize understanding of ways to prevent future related complications and appropriate community resources prn Baseline:  Goal status: INITIAL  2.  Pt will verbalize understanding of cognitive compensations Baseline:  Goal status: INITIAL  3.  Pt will demo visual compensations and safely navigate environment w/ 80% accuracy in environmental scanning Baseline:  Goal status: INITIAL  4.  Pt will improve button/unbutton test to 50 sec or less Baseline: 56 sec Goal status: INITIAL  5.  Pt demo full reaching RUE w/ elbow ext to -20* or less Baseline: -30* Goal status: INITIAL   ASSESSMENT:  CLINICAL IMPRESSION:Pt is progressing towards goals. She has achieved 3 STG's, see goals  for progress.  PERFORMANCE DEFICITS in functional skills including ADLs, IADLs, coordination, dexterity, proprioception, ROM, FMC, mobility, balance, body mechanics, endurance, decreased knowledge of precautions, decreased knowledge of use of DME, vision, and UE functional use, cognitive skills including energy/drive, memory, perception, problem solving, safety awareness, and understand, and psychosocial skills including coping strategies.   IMPAIRMENTS are limiting patient from ADLs, IADLs, work, and social participation.   COMORBIDITIES may have co-morbidities  that affects occupational performance. Patient will benefit from skilled OT to address above impairments and improve overall function.  MODIFICATION OR ASSISTANCE TO COMPLETE EVALUATION: No modification of tasks or assist necessary to complete an evaluation.  OT OCCUPATIONAL  PROFILE AND HISTORY: Detailed assessment: Review of records and additional review of physical, cognitive, psychosocial history related to current functional performance.  CLINICAL DECISION MAKING: Moderate - several treatment options, min-mod task modification necessary  REHAB POTENTIAL: Fair impaired vision and cognition  EVALUATION COMPLEXITY: Moderate  PLAN: OT FREQUENCY: 2x/week  OT DURATION: 8 weeks, plus eval  PLANNED INTERVENTIONS: self care/ADL training, therapeutic exercise, therapeutic activity, neuromuscular re-education, passive range of motion, balance training, functional mobility training, patient/family education, cognitive remediation/compensation, visual/perceptual remediation/compensation, coping strategies training, DME and/or AE instructions, and Re-evaluation  RECOMMENDED OTHER SERVICES: ? Driving eval, resources for Assisted living  CONSULTED AND AGREED WITH PLAN OF CARE: Patient  PLAN FOR NEXT SESSION:  work towards goals, finish checking  STG's next visit, standing functional reach   Cayman Kielbasa, OTR/L 11/03/2021, 10:52 AM  Theone Murdoch, OTR/L [phone] Phone: [phone] 10:52 AM 11/03/21

## 2021-11-03 NOTE — Patient Instructions (Addendum)
Attention strategies for work: Eliminate external distractions - close door, ask to be left alone to focus Work for 15-30 minutes then take a break  Make sure you are re-reading and double checking your work for errors

## 2021-11-03 NOTE — Telephone Encounter (Signed)
Patient received a jury summons and requested to be excused from jury duty.  Please furnish a letter of support to be addressed to the [facility] [place] [facility], attention: [name] request.   Ms. 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  I respectfully request that she be excused from jury duty permanently.

## 2021-11-04 ENCOUNTER — Ambulatory Visit: Payer: Medicare HMO | Admitting: Physical Therapy

## 2021-11-04 ENCOUNTER — Encounter: Payer: Self-pay | Admitting: *Deleted

## 2021-11-04 ENCOUNTER — Encounter: Payer: Medicare HMO | Admitting: Occupational Therapy

## 2021-11-04 NOTE — Progress Notes (Signed)
[employer] PRE[facility] Weekly Session  Patient Details  Name: April Larson MRN: [id number] Date of Birth: October 31, 1950 Age: 71 y.o. PCP: Glendale Chard, MD  Vitals:   11/03/21 1800  Weight: 165 lb 12.8 oz (75.2 kg)     [facility] Weekly seesion - 11/04/21 0900       [facility] "PREP" Location   [facility] "PREP" Location [facility]      Weekly Session   Topic Discussed Restaurant Eating    Minutes exercised this week 23 minutes    Classes attended to date [place] 11/04/2021, 9:31 AM

## 2021-11-04 NOTE — Telephone Encounter (Signed)
Letter written, printed and is pending MD signature.

## 2021-11-04 NOTE — Telephone Encounter (Signed)
Letter signed by Dr Rexene Alberts and sent to medical records for processing.

## 2021-11-05 ENCOUNTER — Ambulatory Visit: Payer: Medicare HMO | Admitting: Physical Therapy

## 2021-11-05 ENCOUNTER — Ambulatory Visit: Payer: Medicare HMO | Admitting: Occupational Therapy

## 2021-11-05 ENCOUNTER — Ambulatory Visit: Payer: Medicare HMO

## 2021-11-05 DIAGNOSIS — R41840 Attention and concentration deficit: Secondary | ICD-10-CM

## 2021-11-05 DIAGNOSIS — R2681 Unsteadiness on feet: Secondary | ICD-10-CM

## 2021-11-05 DIAGNOSIS — R29818 Other symptoms and signs involving the nervous system: Secondary | ICD-10-CM

## 2021-11-05 DIAGNOSIS — M25621 Stiffness of right elbow, not elsewhere classified: Secondary | ICD-10-CM

## 2021-11-05 DIAGNOSIS — R471 Dysarthria and anarthria: Secondary | ICD-10-CM | POA: Diagnosis not present

## 2021-11-05 DIAGNOSIS — R278 Other lack of coordination: Secondary | ICD-10-CM

## 2021-11-05 DIAGNOSIS — R1310 Dysphagia, unspecified: Secondary | ICD-10-CM | POA: Diagnosis not present

## 2021-11-05 DIAGNOSIS — R41842 Visuospatial deficit: Secondary | ICD-10-CM | POA: Diagnosis not present

## 2021-11-05 DIAGNOSIS — R293 Abnormal posture: Secondary | ICD-10-CM

## 2021-11-05 DIAGNOSIS — M6281 Muscle weakness (generalized): Secondary | ICD-10-CM | POA: Diagnosis not present

## 2021-11-05 DIAGNOSIS — R2689 Other abnormalities of gait and mobility: Secondary | ICD-10-CM | POA: Diagnosis not present

## 2021-11-05 DIAGNOSIS — R41841 Cognitive communication deficit: Secondary | ICD-10-CM | POA: Diagnosis not present

## 2021-11-05 NOTE — Therapy (Signed)
OUTPATIENT PHYSICAL THERAPY NEURO TREATMENT   Patient Name: April Larson MRN: [id number] DOB:Sep 03, 1950, 71 y.o., female Today's Date: 11/05/2021   PCP: Glendale Chard, MD REFERRING PROVIDER: [name], MD    PT End of Session - 11/05/21 1104     Visit Number 9    Number of Visits 17   Plus eval   Date for PT Re-Evaluation 12/10/21    Authorization Type [employer]    Progress Note Due on Visit 10    PT Start Time 1104   Handoff w/OT   PT Stop Time 1145    PT Time Calculation (min) 41 min    Activity Tolerance Patient tolerated treatment well    Behavior During Therapy WFL for tasks assessed/performed                Past Medical History:  Diagnosis Date   Diabetes mellitus without complication ([place])    Hypertension    Substance abuse ([place])    Past Surgical History:  Procedure Laterality Date   EYE SURGERY     Patient Active Problem List   Diagnosis Date Noted   Decreased dorsalis pedis pulse 11/17/2020   Tachycardia 08/11/2020   Cough 08/11/2020   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   Pure hypercholesterolemia 01/23/2020   Vitamin D deficiency 10/23/2019   Right hip pain 05/15/2018   Estrogen deficiency 05/15/2018   Class 1 obesity due to excess calories with serious comorbidity and body mass index (BMI) of 31.0 to 31.9 in adult 05/15/2018   Diabetes mellitus with stage 2 chronic kidney disease ([place]) 01/11/2018   Benign hypertension with chronic kidney disease, stage II 01/11/2018    ONSET DATE: 08/25/2021 (referral)  REFERRING DIAG: R47.1 (ICD-10-CM) - Dysarthria and anarthria R41.841 (ICD-10-CM) - Cognitive communication deficit R13.10 (ICD-10-CM) - Dysphagia, unspecified type R27.8 (ICD-10-CM) - Other lack of coordination R26.81 (ICD-10-CM) - Unsteadiness on feet R29.3 (ICD-10-CM) - Abnormal posture G20 (ICD-10-CM) - Primary parkinsonism ([place])   THERAPY DIAG:  Unsteadiness on feet  Other lack of coordination  Attention and  concentration deficit  Rationale for Evaluation and Treatment Rehabilitation  PERTINENT HISTORY: HTN, DM, high chol, CKD, Supranuclear Palsy  PRECAUTIONS: Fall  PATIENT GOALS "I wanna work on walking"   OBJECTIVE:   COGNITION: Overall cognitive status: No family/caregiver present to determine baseline cognitive functioning   SUBJECTIVE: Pt denies falls or any changes since last session. Pt reports her HEP is "good", states she feels as though she is "flopping" when she sits  PAIN:  Are you having pain? No    TODAY'S TREATMENT:  Ther Act  STG Assessment   [facility] PT Assessment - 11/05/21 1110       Transfers   Five time sit to stand comments  19.82s without UE support, retropulsion on first rep      Timed Up and Go Test   Normal TUG (seconds) 12.01    Cognitive TUG (seconds) 15.55   Retro counting by 3s starting at 27            NMR  Reviewed and practiced HEP as pt did not realize she already had sit <>stand exercises and per pt's sister's request. Provided new handout as pt no longer has a copy:   - Sit to stand with red band pull-apart, x15 reps w/mod verbal and visual cues for proper body positioning throughout and anterior weight shift. Pt frequently bracing against mat to stabilize rather than shifting weight forward and noted toes frequently coming off ground due to retropulsion.   - Plank with Thoracic Rotation on Counter , x10 reps per side. Min cues to maintain gaze on moving hand for facilitation of thoracic rotation.   - Side to Side Weight Shift with Overhead Reach and Counter Support, x10 reps per side using post-it notes for visual targets to reach for. Noted good lateral weight shift to R side (L heel lifting off ground) and decreased weight shift to L side (R heel  staying on ground)   Mass practice sit <>stands w/blue wedge under B feet to bias posterior lean to facilitate improved anterior weight shift, high velocity movement and BLE strength. Pt unable to perform without UE support initially. Provided max concurrent cues using NDT principles for proper body mechanics and weight shifting and pt able to perform x5 reps w/UE support. Progressed to attempting without UE support and out of 10 attempts, pt able to perform x2. Noted poor eccentric control w/stand <>sit despite max cues.     PATIENT EDUCATION: Education details: Continue with current HEP, goal outcomes  Person educated: Patient Education method: Explanation, Demonstration, and Handouts Education comprehension: verbalized understanding    HOME EXERCISE PROGRAM: Access Code: [email or web address], Standing PWR moves.      GOALS: Goals reviewed with patient? Yes  SHORT TERM GOALS: Target date: 11/05/2021  Pt will be independent with initial HEP for improved strength, balance, transfers and gait. Baseline: Has HEP from previous bout of therapy, needs to be reviewed; pt reports she performs every day  Goal status: MET  2.  Pt will verbalize and demonstrate fall prevention strategies in the home and community for reduced fall risk and frequency.  Baseline: Pt able to verbalize reduction of throw rugs, having well-lit areas and using rails on stairs  Goal status: MET  3.  Pt will improve cog TUG to less than or equal to 11 seconds for improved functional mobility and decreased fall risk. Baseline: 13.25s; 15.55s on 8/3 Goal status: NOT MET  4.  Pt will improve 5 x STS to less than or equal to 13 seconds without UE support and absent retropulsion to demonstrate improved functional strength and transfer efficiency.  Baseline: 16.15s w/retropulsion; 19.82s without UE support, retropulsion on first rep  Goal status: IN PROGRESS  LONG TERM GOALS: Target date: 12/03/2021  Pt will be independent with  final HEP for improved strength, balance, transfers and gait. Baseline:  Goal status: INITIAL  2.  Pt will verbalize understanding of local PD community resources, including fitness post DC.  Baseline:  Goal status: INITIAL  3.  Pt will improve MiniBest to 21/28 for decreased fall risk and improvement with compensatory stepping strategies.  Baseline: 16/28 Goal status: INITIAL  4. Pt will improve 5 x STS to less than or equal to 13 seconds without UE support and absent retropulsion to demonstrate improved functional strength and transfer efficiency.  Baseline: 16.15s w/retropulsion; 19.82s without UE support, retropulsion on first rep on 8/2  Goal status: NEW   ASSESSMENT:  CLINICAL IMPRESSION: Emphasis of skilled PT session on STG assessment and transfers. Pt has met 2/4 STGs, stating she performs her HEP daily and has taken steps at home for fall risk prevention. Pt able to perform 5x STS without UE support but continue to demonstrate retropulsion w/reliance on bracing against chair to maintain balance as well as slower speed to perform. Pt also slower on cog TUG but did maintain retro counting throughout. Pt would like to continue to work on sit <>stands, so new LTG added to reflect pt's request. Continue POC.   OBJECTIVE IMPAIRMENTS decreased activity tolerance, decreased balance, decreased cognition, decreased coordination, decreased knowledge of condition, decreased knowledge of use of DME, decreased mobility, difficulty walking, decreased strength, decreased safety awareness, impaired perceived functional ability, impaired vision/preception, and improper body mechanics.    ACTIVITY LIMITATIONS carrying, lifting, bending, sitting, standing, squatting, stairs, transfers, and locomotion level   PARTICIPATION LIMITATIONS: meal prep, cleaning, laundry, medication management, driving, shopping, community activity, and yard work   PERSONAL FACTORS Behavior pattern, Fitness, Transportation,  and 1 comorbidity: lack of support in home environment  are also affecting patient's functional outcome.    REHAB POTENTIAL: Good   CLINICAL DECISION MAKING: Evolving/moderate complexity   EVALUATION COMPLEXITY: Moderate  PLAN: PT FREQUENCY: 2x/week  PT DURATION: 8 weeks  PLANNED INTERVENTIONS: Therapeutic exercises, Therapeutic activity, Neuromuscular re-education, Balance training, Gait training, Patient/Family education, Stair training, DME instructions, Manual therapy, and Re-evaluation  PLAN FOR NEXT SESSION:  10th visit PN, continue variable sit <>stands, Visual scanning. Stepping strategies. Balance and SLS tasks. Try standing PWR moves on compliant surfaces.    Cruzita Lederer Love Milbourne, PT, DPT 11/05/21, 11:50 AM

## 2021-11-05 NOTE — Therapy (Signed)
OUTPATIENT OCCUPATIONAL THERAPY PARKINSON'S Treatment  Patient Name: April Larson MRN: [id number] DOB:20-Jun-1950, 71 y.o., female Today's Date: 11/05/2021  PCP: Glendale Chard, MD REFERRING PROVIDER: [name], MD    OT End of Session - 11/05/21 1252     Visit Number 9    Number of Visits 17    Date for OT Re-Evaluation 12/03/21    Authorization Type [employer] MCR    Authorization - Visit Number 9    Progress Note Due on Visit 10    OT Start Time 1021    OT Stop Time 1100    OT Time Calculation (min) 39 min                    Past Medical History:  Diagnosis Date   Diabetes mellitus without complication ([place])    Hypertension    Substance abuse ([place])    Past Surgical History:  Procedure Laterality Date   EYE SURGERY     Patient Active Problem List   Diagnosis Date Noted   Decreased dorsalis pedis pulse 11/17/2020   Tachycardia 08/11/2020   Cough 08/11/2020   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   Pure hypercholesterolemia 01/23/2020   Vitamin D deficiency 10/23/2019   Right hip pain 05/15/2018   Estrogen deficiency 05/15/2018   Class 1 obesity due to excess calories with serious comorbidity and body mass index (BMI) of 31.0 to 31.9 in adult 05/15/2018   Diabetes mellitus with stage 2 chronic kidney disease ([place]) 01/11/2018   Benign hypertension with chronic kidney disease, stage II 01/11/2018    ONSET DATE: 08/25/21 (referral date)   REFERRING DIAG: G20ICD-10-CM Primary parkinsonism ([place])   THERAPY DIAG:  Muscle weakness (generalized)  Unsteadiness on feet  Other symptoms and signs involving the nervous system  Abnormal posture  Other lack of coordination  Stiffness of right elbow, not elsewhere classified  Visuospatial deficit  Attention and concentration deficit  Rationale for Evaluation and Treatment Rehabilitation  SUBJECTIVE:   SUBJECTIVE STATEMENT: Pt reports performing exercises with paper towel roll.  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 No  PATIENT GOALS walking and balance, handwriting  OBJECTIVE: Pt demo inability to track inferiorly, limited ability/difficulty to track superiorly, difficulty tracking to the L and significant limitation tracking to the R.  Pt appears to demo decr peripheral vision on both sides R>L.     TODAY'S TREATMENT:  PWR! moves basic 4 in seated 10 reps each, min v.c to watch her hand, and for amplitude Reviewed safety for sit to stand and stand to sit using PWR! up. Fastening / unfastening buttons with big movements, pt demonstrates excellent progress. She achieved her long term goal for fastening buttons. 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 Importance of focusing on whatever task she is performing . Pt is easily distracted by others in the environment. Therapist discussed pt work tasks, precautions and plans for evals in 6 mons. Pt is in agreement.   PATIENT EDUCATION: N/A   HOME EXERCISE PROGRAM:  Vision HEP 10/27/21-Closed chain shoulder flexion with paper towel roll 10/29/21-coordination HEP   GOALS: Goals reviewed with patient? Yes  SHORT TERM GOALS: Target date: 11/05/2021   Independent w/ modified PD HEP (? Bag ex's, focus on ROM, trunk) Baseline: Goal status: MET  2.  Pt to verbalize understanding  w/ safety and fall prevention techniques including DME for showering Baseline:  Goal status: MET  3.  Pt to improve standing functional reach test to 10" or greater Rt side Baseline: 9" (Lt = 12")  Goal status: 10.5 RUE  4.  Pt to verbalize understanding with handwriting strategies and demo minimal micrographia for 3 sentences Baseline:  Goal status: MET Pt demonstrates ability to perform  5.  Pt will verbalize understanding of adaptive strategies to  increase ease/efficiency and safety w/ ADLS/IADLS Baseline:  Goal status:ongoing  6.  Pt to improve Rt hand coordination as evidenced by performing 9 hole peg test in 35 sec or less Baseline: Rt = 40 sec (Lt = 28 sec)  Goal status: ongoing, improved but not met 36.72  LONG TERM GOALS: Target date: 12/03/2021    Pt will verbalize understanding of ways to prevent future related complications and appropriate community resources prn Baseline:  Goal status: INITIAL  2.  Pt will verbalize understanding of cognitive compensations Baseline:  Goal status: INITIAL  3.  Pt will demo visual compensations and safely navigate environment w/ 80% accuracy in environmental scanning Baseline:  Goal status: INITIAL  4.  Pt will improve button/unbutton test to 50 sec or less Baseline: 56 sec Goal status: MET 42.82  5.  Pt demo full reaching RUE w/ elbow ext to -20* or less Baseline: -30* Goal status: INITIAL   ASSESSMENT:  CLINICAL IMPRESSION:Pt is progressing towards goals. She achieved long term goal #4 today.   PERFORMANCE DEFICITS in functional skills including ADLs, IADLs, coordination, dexterity, proprioception, ROM, FMC, mobility, balance, body mechanics, endurance, decreased knowledge of precautions, decreased knowledge of use of DME, vision, and UE functional use, cognitive skills including energy/drive, memory, perception, problem solving, safety awareness, and understand, and psychosocial skills including coping strategies.   IMPAIRMENTS are limiting patient from ADLs, IADLs, work, and social participation.   COMORBIDITIES may have co-morbidities  that affects occupational performance. Patient will benefit from skilled OT to address above impairments and improve overall function.  MODIFICATION OR ASSISTANCE TO COMPLETE EVALUATION: No modification of tasks or assist necessary to complete an evaluation.  OT OCCUPATIONAL PROFILE AND HISTORY: Detailed assessment: Review of records and  additional review of physical, cognitive, psychosocial history related to current functional performance.  CLINICAL DECISION MAKING: Moderate - several treatment options, min-mod task modification necessary  REHAB POTENTIAL: Fair impaired vision and cognition  EVALUATION COMPLEXITY: Moderate  PLAN: OT FREQUENCY: 2x/week  OT DURATION: 8 weeks, plus eval  PLANNED INTERVENTIONS: self care/ADL training, therapeutic exercise, therapeutic activity, neuromuscular re-education, passive range of motion, balance training, functional mobility training, patient/family education, cognitive remediation/compensation, visual/perceptual remediation/compensation, coping strategies training, DME and/or AE instructions, and Re-evaluation  RECOMMENDED OTHER SERVICES: ? Driving eval, resources for Assisted living  CONSULTED AND AGREED WITH PLAN OF CARE: Patient  PLAN FOR NEXT SESSION:  issue PWR! Moves basic 4 in seated, community resources   Krisanne Lich, OTR/L 11/05/2021, 12:52 PM  Theone Murdoch, OTR/L [phone] Phone: [phone] 12:52 PM 11/05/21

## 2021-11-05 NOTE — Patient Instructions (Addendum)
Stop and think - talk yourself through the process  Say aloud the process to sit and stand so you don't flop Say aloud the steps in a process to help keep your focus How to safely get in the car  The next ingredients or steps in your cooking process   Stop and think about the safety concerns that may be in your environment  Remove any tripping hazards (bathroom rugs, shoes)

## 2021-11-05 NOTE — Therapy (Signed)
OUTPATIENT SPEECH LANGUAGE PATHOLOGY TREATMENT NOTE   Patient Name: April Larson MRN: [id number] DOB:October 08, 1950, 71 y.o., female Today's Date: 11/05/2021  PCP: Glendale Chard, MD REFERRING PROVIDER: [name], MD   END OF SESSION:   End of Session - 11/05/21 1014     Visit Number 9    Number of Visits 17    Date for SLP Re-Evaluation 12/04/21    Authorization Type [employer]    Authorization Time Period $20 co pay per day    SLP Start Time 1145    SLP Stop Time  19    SLP Time Calculation (min) 45 min    Activity Tolerance Patient tolerated treatment well                 Past Medical History:  Diagnosis Date   Diabetes mellitus without complication ([place])    Hypertension    Substance abuse ([place])    Past Surgical History:  Procedure Laterality Date   EYE SURGERY     Patient Active Problem List   Diagnosis Date Noted   Decreased dorsalis pedis pulse 11/17/2020   Tachycardia 08/11/2020   Cough 08/11/2020   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   Pure hypercholesterolemia 01/23/2020   Vitamin D deficiency 10/23/2019   Right hip pain 05/15/2018   Estrogen deficiency 05/15/2018   Class 1 obesity due to excess calories with serious comorbidity and body mass index (BMI) of 31.0 to 31.9 in adult 05/15/2018   Diabetes mellitus with stage 2 chronic kidney disease ([place]) 01/11/2018   Benign hypertension with chronic kidney disease, stage II 01/11/2018    ONSET DATE: PD dx 2021; 08/25/2021 (referral date)  REFERRING DIAG: R47.1ICD-10-CM Dysarthria and anarthria  R41.841ICD-10-CM Cognitive communication deficit  R13.10ICD-10-CM Dysphagia, unspecified type   THERAPY DIAG:  Cognitive communication deficit  Dysarthria and anarthria  Dysphagia, unspecified type  Rationale for Evaluation and Treatment Rehabilitation  SUBJECTIVE: "We worked on sit to stands"  PAIN:  Are you having pain? No   OBJECTIVE:  TODAY'S TREATMENT:  11-05-21: Pt  reportedly self-identified "flopping down" while transferring into chairs. Generated strategy of verbal sequencing aloud to aid attention and accuracy, which was effective in demonstration today given occasional mod A to implement and recall. Pt able to appropriately sequence steps for cooking and transfers with usual prompting for next step. Sister reported recent safety concern with pillows on ground, which pt was attempting to step on/over. Pt needed assistance to comprehend why scenario was unsafe but able to recall and explain today. Targeted any additional safety concerns around house, in which pt able to ID additional problems with usual prompting from sister.   11-03-21: Pt continues with excellent carryover of SLP recommendations and compensations to assist with cognitive linguistic functioning and swallow functioning. Sister provided positive feedback of patient carryover of trained techniques. No overt s/sx of aspiration reported with PO intake with implementation of SLP recommendations. Discussed possible challenges related to work, in which pt endorsed feeling "scattered" sometimes related to becoming distracted. Pt able to ID need to eliminate distractions by closing her office door. SLP also recommended only working for length of attention span (pt approximated 15-30 minutes) and taking a cognitive breaks as needed. Targeted alternating attention task for typing work related information, in which pt able to ID typing errors with good accuracy and timeliness. Occasional min A required to ID 1/2 possible errors with provided typing material. Pt denied any recent anomia/dysnomia.   10-29-21: Pt completed HEP with success. Educated and instructed  recommended attention and memory compensations to aid auditory processing, accuracy of task completion, and functional recall. Pt benefits from focusing on one task at time, repetition of information, and slower speech. Discussed upcoming return to work, in  which pt able to ID possible challenges with occasional prompting.   10-27-21: Pt reported fatigue after working at [employer] job last night. Denied any cognitive or communicative challenges for part-time work directing guests to seats. Success with time management and establishment of new routines reported by patient and sister. Continues to deny any recent word finding episodes or difficulties. Some reduced recall exhibited in relation to family assisting with organizing her home (sister prompted recall of plan to put dog pad on porch and put mail in stand up versus drawer). SLP educated and instructed memory compensations to aid recall, such as repetition and writing down information to aid patient comprehension and carryover of new procedures. Pt exhibited adequate delayed recall of new procedures with mild prompting and cues. Discussed additional ways to organize house to aid attention and recall of personal items.   10-22-21: Recalled prior therapy activities and able to ID strategies to implement (visual scanning). Completed HEP to ID solution to optimize time management, in which pt identified need to wake up earlier. Successful implementation of solution and improved time management reported this morning. Time management worksheet completed independently was 50% accurate, which improved to 95% accurate with SLP re-reading question aloud. Educated patient on attention, recall, and comprehension strategies, such as monotasking, using external recall aids, re-reading to ensure comprehension, and double checking for error awareness. No overt word retrieval difficulty reported recently. SLP assisted patient with self-recognition of variations in volume (too loud versus too soft).   10-20-21: Reported she lost hwk from last session. Targeted functional time management tasks today, in which pt completed with rare min A and benefited from cued double check. Discussed targeted cognitive communication strategies  from last session, in which pt able to recall with mod I. Some increased awareness exhibited, in which pt used retrospective analysis to determine event x1 in which "stop and think" would have benefited her. Pt alerted sister of self-advocacy plan if patient is not understanding or needs repetition. Targeted functional time calculations for her morning routine to aid problem solving and attention for prepared arrival of transportation. Pt able to identify she needed more time to realistically complete her morning routine. Requested pt ID solutions for next ST session.   10-15-21: Pt reportedly completed personal goal to organize dining room table in order to eat there at mealtimes for increased safety. Improved swallow function reported with use of recommended techniques (see previous note). Pt reported a fall following last therapy sessions while attempting to walk into her house holding items in both hands. Pt denied any cognitive or physical changes s/p fall despite endorsing hitting her head. SLP recommended ongoing monitoring in case sx arise. SLP educated and trained technique to aid processing and problem solving, with pt implementing intentional pause to process and plan prior to execution of task. Discussed importance of initiating this technique as thought processing has slowed and reduced safety awareness demonstrated. Targeted time management task, in which pt required usual repetition/rephrasing to optimize comprehension of auditory question x1/4. SLP recommended pt self-advocate if she is not understanding or attending in conversation, with example phrases provided to aid self-advocacy. No overt dysnomia or anomia exhibited in conversation today. Pt was noted to repeat therapist very frequently and attempt to fill in answers.    PATIENT EDUCATION:  Education details: see above Person educated: Patient Education method: Customer service manager Education comprehension: verbalized  understanding, returned demonstration, and needs further education     GOALS: Goals reviewed with patient? Yes   SHORT TERM GOALS: Target date: 11/05/2021    Pt will complete clinical swallow assessment in first 1-2 ST sessions and pt will complete MBSS as needed for additional assessment per SLP recommendation  Baseline: Goal status: Met (consider future MBSS referral pending implementation of swallow precautions)   2.  Pt will utilize dysarthria compensations to optimize vocal intensity and clarity in short structured conversations given occasional mod A over 2 sessions Baseline: 10-29-21, 11-03-21 Goal status: Met   3.  Pt will utilize word retrieval strategies to aid anomia/dysnomia on structured speech tasks given occasional mod A over 2 sessions Baseline:  Goal status: Deferred (not reported over last 4 sessions)   4.  Pt will implement attention/processing strategies in short structured conversations to aid patient engagement given occasional mod A over 2 sessions  Baseline: 10-22-21, 10-29-21 Goal status: Met   5.  Pt will implement memory compensations to optimize performance at work and home given occasional mod A over 2 sessions Baseline: 10-22-21, 10-29-21 Goal status: Met   6.  Family will utilize techniques to aid patient processing speed and comprehension given occasional min A over 2 sessions  Baseline: 10-22-21, 10-27-21 Goal status: Met    LONG TERM GOALS: Target date: 12/04/2021     Pt will implement attention/processing strategies in 10-15 minute conversations to aid patient engagement given occasional min A over 2 sessions  Baseline:  Goal status: ongoing   2.  Pt will demonstrate ability to ID and correct/compensate for anomia/dysnomia in 10-15 minute conversations given occasional min A over 2 sessions Baseline: 11-05-21 Goal status: ongoing   3.  Pt will implement memory compensations to optimize performance at work and home given occasional min A over 2  sessions Baseline:  Goal status: ongoing   4.  Family will utilize techniques to aid patient processing speed and comprehension given rare min A over 2 sessions  Baseline:  Goal status: ongoing   5.  Pt will consume safest and least restrictive diet with reduced s/sx of aspiration with use of trained techniques as needed over 2 sessions Baseline:  Goal status: ongoing   ASSESSMENT:   CLINICAL IMPRESSION: Patient is a 71 y.o. 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 Targeted cognitive communication strategies to implement at home and work to aid cognitive functioning (processing, recall, attention) with support from family requested to aid carryover. Good carryover of recommended SLP techniques and strategies reported thus far. Pt would continue to benefit from skilled ST intervention to optimize functional independence and safety.    OBJECTIVE IMPAIRMENTS  Objective impairments include attention, memory, executive functioning, expressive language, dysarthria, and dysphagia. These impairments are limiting patient from household responsibilities, ADLs/IADLs, effectively communicating at home and in community, and safety when swallowing.Factors affecting potential to achieve goals and functional outcome are ability to learn/carryover information, cooperation/participation level, medical prognosis, and previous level of function. Patient will benefit from skilled SLP services to address above impairments and improve overall function.   REHAB POTENTIAL: Fair - hx of reduced carryover of HEP   PLAN: SLP FREQUENCY: 2x/week   SLP DURATION: 8 weeks   PLANNED INTERVENTIONS: Language facilitation, Environmental controls, Cueing hierachy, Cognitive reorganization, Internal/external aids, Functional tasks, Multimodal communication approach, SLP instruction and feedback, Compensatory strategies, and Patient/family education     Hayward  I Johnson, CCC-SLP 11/05/2021, 10:14 AM

## 2021-11-11 ENCOUNTER — Ambulatory Visit: Payer: Medicare HMO | Admitting: Occupational Therapy

## 2021-11-11 ENCOUNTER — Ambulatory Visit: Payer: Medicare HMO

## 2021-11-11 ENCOUNTER — Ambulatory Visit: Payer: Medicare HMO | Admitting: Physical Therapy

## 2021-11-11 ENCOUNTER — Encounter: Payer: Self-pay | Admitting: Physical Therapy

## 2021-11-11 DIAGNOSIS — R293 Abnormal posture: Secondary | ICD-10-CM

## 2021-11-11 DIAGNOSIS — M6281 Muscle weakness (generalized): Secondary | ICD-10-CM | POA: Diagnosis not present

## 2021-11-11 DIAGNOSIS — R2689 Other abnormalities of gait and mobility: Secondary | ICD-10-CM | POA: Diagnosis not present

## 2021-11-11 DIAGNOSIS — R471 Dysarthria and anarthria: Secondary | ICD-10-CM | POA: Diagnosis not present

## 2021-11-11 DIAGNOSIS — R2681 Unsteadiness on feet: Secondary | ICD-10-CM | POA: Diagnosis not present

## 2021-11-11 DIAGNOSIS — R41840 Attention and concentration deficit: Secondary | ICD-10-CM

## 2021-11-11 DIAGNOSIS — M25621 Stiffness of right elbow, not elsewhere classified: Secondary | ICD-10-CM

## 2021-11-11 DIAGNOSIS — R41842 Visuospatial deficit: Secondary | ICD-10-CM | POA: Diagnosis not present

## 2021-11-11 DIAGNOSIS — R29818 Other symptoms and signs involving the nervous system: Secondary | ICD-10-CM

## 2021-11-11 DIAGNOSIS — R278 Other lack of coordination: Secondary | ICD-10-CM | POA: Diagnosis not present

## 2021-11-11 DIAGNOSIS — R41841 Cognitive communication deficit: Secondary | ICD-10-CM | POA: Diagnosis not present

## 2021-11-11 DIAGNOSIS — R1310 Dysphagia, unspecified: Secondary | ICD-10-CM | POA: Diagnosis not present

## 2021-11-11 NOTE — Therapy (Signed)
OUTPATIENT OCCUPATIONAL THERAPY PARKINSON'S Treatment  Patient Name: April Larson MRN: [id number] DOB:[birth year], 71 y.o., female Today's Date: 11/11/2021  PCP: Glendale Chard, MD REFERRING PROVIDER: [name], MD    OT End of Session - 11/11/21 1330     Visit Number 10    Number of Visits 17    Date for OT Re-Evaluation 12/03/21    Authorization Type [employer] MCR    Authorization - Visit Number 10    Progress Note Due on Visit 10    OT Start Time 1317    OT Stop Time 1357    OT Time Calculation (min) 40 min    Activity Tolerance Patient tolerated treatment well    Behavior During Therapy [facility] for tasks assessed/performed                     Past Medical History:  Diagnosis Date   Diabetes mellitus without complication ([place])    Hypertension    Substance abuse ([place])    Past Surgical History:  Procedure Laterality Date   EYE SURGERY     Patient Active Problem List   Diagnosis Date Noted   Decreased dorsalis pedis pulse 11/17/2020   Tachycardia 08/11/2020   Cough 08/11/2020   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   Pure hypercholesterolemia 01/23/2020   Vitamin D deficiency 10/23/2019   Right hip pain 05/15/2018   Estrogen deficiency 05/15/2018   Class 1 obesity due to excess calories with serious comorbidity and body mass index (BMI) of 31.0 to 31.9 in adult 05/15/2018   Diabetes mellitus with stage 2 chronic kidney disease ([place]) 01/11/2018   Benign hypertension with chronic kidney disease, stage II 01/11/2018    ONSET DATE: 08/25/21 (referral date)   REFERRING DIAG: G20ICD-10-CM Primary parkinsonism ([place])   THERAPY DIAG:  Muscle weakness (generalized)  Other symptoms and signs involving the nervous system  Abnormal posture  Other lack of coordination  Attention and concentration deficit  Stiffness of right elbow, not elsewhere classified  Visuospatial deficit  Unsteadiness on feet  Rationale for Evaluation and Treatment  Rehabilitation  SUBJECTIVE:   SUBJECTIVE STATEMENT: Pt reports she is doing well  Pt accompanied by sister  PERTINENT HISTORY: 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 No  PATIENT GOALS walking and balance, handwriting  OBJECTIVE: Pt demo inability to track inferiorly, limited ability/difficulty to track superiorly, difficulty tracking to the L and significant limitation tracking to the R.  Pt appears to demo decr peripheral vision on both sides R>L.     TODAY'S TREATMENT:  PWR! moves basic 4 in seated 10 reps each, min v.c to watch her hand, and for amplitude Picking up and stacking coins with big movements, manipulating in hand, flipping and dealing cards with big movements, min v.c to maintain focus, pt with improved performance Trail making task A, with 100% accuracy for visual and cognitive skills. Number cancellation task with cognitive component, min v.c - pt did not complete due to time constraints, therapist issued as HEP.   PATIENT EDUCATION: PWR! moves basic 4 in seated 10 reps each, min v.c to watch her hand, and for amplitude   HOME EXERCISE PROGRAM:  Vision HEP 10/27/21-Closed chain shoulder flexion with paper towel roll 10/29/21-coordination HEP 11/11/21-PWR! moves basic 4 in seated    GOALS: Goals reviewed with patient? Yes  SHORT TERM GOALS: Target date: 11/05/2021   Independent w/ modified PD HEP (?  Bag ex's, focus on ROM, trunk) Baseline: Goal status: MET  2.  Pt to verbalize understanding w/ safety and fall prevention techniques including DME for showering Baseline:  Goal status: MET  3.  Pt to improve standing functional reach test to 10" or greater Rt side Baseline: 9" (Lt = 12")  Goal status: 10.5 RUE  4.  Pt to verbalize understanding with handwriting strategies and demo minimal micrographia for 3 sentences Baseline:  Goal status: MET Pt demonstrates ability  to perform  5.  Pt will verbalize understanding of adaptive strategies to increase ease/efficiency and safety w/ ADLS/IADLS Baseline:  Goal status:ongoing  6.  Pt to improve Rt hand coordination as evidenced by performing 9 hole peg test in 35 sec or less Baseline: Rt = 40 sec (Lt = 28 sec)  Goal status: ongoing, improved but not met 36.72  LONG TERM GOALS: Target date: 12/03/2021    Pt will verbalize understanding of ways to prevent future related complications and appropriate community resources prn Baseline:  Goal status: ongoing   2.  Pt will verbalize understanding of cognitive compensations Baseline:  Goal status: INITIAL  3.  Pt will demo visual compensations and safely navigate environment w/ 80% accuracy in environmental scanning Baseline:  Goal status: INITIAL  4.  Pt will improve button/unbutton test to 50 sec or less Baseline: 56 sec Goal status: MET 42.82  5.  Pt demo full reaching RUE w/ elbow ext to -20* or less Baseline: -30* Goal status:    ASSESSMENT:  CLINICAL IMPRESSION: For the reporting period of 10/08/21-11/11/21 pt is progressing towards goals. Pt has achieved 4/6 short term goals and 1 long term goal. Pt is progressing towards remaining goals. Pt can benefit from skilled occupational therapy to address: visual deficits, cognitive deficits, coordination, balance, functional mobility in order to maximize pt's safety and I with ADLS/IADLS.  PERFORMANCE DEFICITS in functional skills including ADLs, IADLs, coordination, dexterity, proprioception, ROM, FMC, mobility, balance, body mechanics, endurance, decreased knowledge of precautions, decreased knowledge of use of DME, vision, and UE functional use, cognitive skills including energy/drive, memory, perception, problem solving, safety awareness, and understand, and psychosocial skills including coping strategies.   IMPAIRMENTS are limiting patient from ADLs, IADLs, work, and social participation.    COMORBIDITIES may have co-morbidities  that affects occupational performance. Patient will benefit from skilled OT to address above impairments and improve overall function.  MODIFICATION OR ASSISTANCE TO COMPLETE EVALUATION: No modification of tasks or assist necessary to complete an evaluation.  OT OCCUPATIONAL PROFILE AND HISTORY: Detailed assessment: Review of records and additional review of physical, cognitive, psychosocial history related to current functional performance.  CLINICAL DECISION MAKING: Moderate - several treatment options, min-mod task modification necessary  REHAB POTENTIAL: Fair impaired vision and cognition  EVALUATION COMPLEXITY: Moderate  PLAN: OT FREQUENCY: 2x/week  OT DURATION: 8 weeks, plus eval  PLANNED INTERVENTIONS: self care/ADL training, therapeutic exercise, therapeutic activity, neuromuscular re-education, passive range of motion, balance training, functional mobility training, patient/family education, cognitive remediation/compensation, visual/perceptual remediation/compensation, coping strategies training, DME and/or AE instructions, and Re-evaluation  RECOMMENDED OTHER SERVICES: ? Driving eval, resources for Assisted living  CONSULTED AND AGREED WITH PLAN OF CARE: Patient  PLAN FOR NEXT SESSION:  community resources, cognitive compensations, schedule screens in 6 mons   Andreal Vultaggio, OTR/L 11/11/2021, 1:31 PM  Theone Murdoch, OTR/L [phone] Phone: [phone] 1:31 PM 11/11/21

## 2021-11-11 NOTE — Patient Instructions (Signed)

## 2021-11-11 NOTE — Therapy (Signed)
OUTPATIENT SPEECH LANGUAGE PATHOLOGY TREATMENT NOTE (PROGRESS NOTE)   Patient Name: April Larson MRN: [id number] DOB:[birth year], 71 y.o., female Today's Date: 11/11/2021  PCP: Glendale Chard, MD REFERRING PROVIDER: [name], MD   END OF SESSION:   End of Session - 11/11/21 1229     Visit Number 10    Number of Visits 17    Date for SLP Re-Evaluation 12/04/21    Authorization Type [employer]    Authorization Time Period $20 co pay per day    SLP Start Time 1230    SLP Stop Time  1315    SLP Time Calculation (min) 45 min    Activity Tolerance Patient tolerated treatment well              Speech Therapy Progress Note  Dates of Reporting Period: 10-08-21 to current  Objective Reports of Subjective Statement: Pt has been seen for 10 ST visits targeting cognition, communication, and swallowing, with good carryover of SLP recommendations exhibited to improve functional independence and safety.   Objective Measurements: Pt exhibits improved awareness of functional deficits, ability to ID appropriate strategies to modify tasks/behaviors for increased independence and safety, and improved swallow function with use of recommended compensations.   Goal Update: see below  Plan: continue per POC  Reason Skilled Services are Required: Pt would continue to benefit from skilled ST intervention to optimize safety and functionality.      Past Medical History:  Diagnosis Date   Diabetes mellitus without complication ([place])    Hypertension    Substance abuse ([place])    Past Surgical History:  Procedure Laterality Date   EYE SURGERY     Patient Active Problem List   Diagnosis Date Noted   Decreased dorsalis pedis pulse 11/17/2020   Tachycardia 08/11/2020   Cough 08/11/2020   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   Pure hypercholesterolemia 01/23/2020   Vitamin D deficiency 10/23/2019   Right hip pain 05/15/2018   Estrogen deficiency 05/15/2018   Class 1  obesity due to excess calories with serious comorbidity and body mass index (BMI) of 31.0 to 31.9 in adult 05/15/2018   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 No   OBJECTIVE:  TODAY'S TREATMENT:  11-11-21: Good carryover of SLP recommendations reported at home. While discussing recent modifications to shower (added shower chair and new shower head), pt exhibited intermittent anomia and dysnomia impacting message cohesion and listener comprehension. SLP educated and instructed word retrieval strategies to aid word finding. Targeted SFA and use of description strategy, in which pt able to demonstrate with intermittent mild prompting for specific information and more thorough descriptions. Provided HEP to target word retrieval.   11-05-21: Pt reportedly self-identified "flopping down" while transferring into chairs. Generated strategy of verbal sequencing aloud to aid attention and accuracy, which was effective in demonstration today given occasional mod A to implement and recall. Pt able to appropriately sequence steps for cooking and transfers with usual prompting for next step. Sister reported recent safety concern with pillows on ground, which pt was attempting to step on/over. Pt  needed assistance to comprehend why scenario was unsafe but able to recall and explain today. Targeted any additional safety concerns around house, in which pt able to ID additional problems with usual prompting from  sister.   11-03-21: Pt continues with excellent carryover of SLP recommendations and compensations to assist with cognitive linguistic functioning and swallow functioning. Sister provided positive feedback of patient carryover of trained techniques. No overt s/sx of aspiration reported with PO intake with implementation of SLP recommendations. Discussed possible challenges related to work, in which pt endorsed feeling "scattered" sometimes related to becoming distracted. Pt able to ID need to eliminate distractions by closing her office door. SLP also recommended only working for length of attention span (pt approximated 15-30 minutes) and taking a cognitive breaks as needed. Targeted alternating attention task for typing work related information, in which pt able to ID typing errors with good accuracy and timeliness. Occasional min A required to ID 1/2 possible errors with provided typing material. Pt denied any recent anomia/dysnomia.   10-29-21: Pt completed HEP with success. Educated and instructed recommended attention and memory compensations to aid auditory processing, accuracy of task completion, and functional recall. Pt benefits from focusing on one task at time, repetition of information, and slower speech. Discussed upcoming return to work, in which pt able to ID possible challenges with occasional prompting.   10-27-21: Pt reported fatigue after working at [employer] job last night. Denied any cognitive or communicative challenges for part-time work directing guests to seats. Success with time management and establishment of new routines reported by patient and sister. Continues to deny any recent word finding episodes or difficulties. Some reduced recall exhibited in relation to family assisting with organizing her home (sister prompted recall of plan to put dog pad on porch and put mail in stand up versus drawer). SLP educated and instructed memory compensations to aid recall, such as repetition and  writing down information to aid patient comprehension and carryover of new procedures. Pt exhibited adequate delayed recall of new procedures with mild prompting and cues. Discussed additional ways to organize house to aid attention and recall of personal items.   10-22-21: Recalled prior therapy activities and able to ID strategies to implement (visual scanning). Completed HEP to ID solution to optimize time management, in which pt identified need to wake up earlier. Successful implementation of solution and improved time management reported this morning. Time management worksheet completed independently was 50% accurate, which improved to 95% accurate with SLP re-reading question aloud. Educated patient on attention, recall, and comprehension strategies, such as monotasking, using external recall aids, re-reading to ensure comprehension, and double checking for error awareness. No overt word retrieval difficulty reported recently. SLP assisted patient with self-recognition of variations in volume (too loud versus too soft).   10-20-21: Reported she lost hwk from last session. Targeted functional time management tasks today, in which pt completed with rare min A and benefited from cued double check. Discussed targeted cognitive communication strategies from last session, in which pt able to recall with mod I. Some increased awareness exhibited, in which pt used retrospective analysis to determine event x1 in which "stop and think" would have benefited her. Pt alerted sister of self-advocacy plan if patient is not understanding or needs repetition. Targeted functional time calculations for her morning routine to aid problem solving and attention for prepared arrival of transportation. Pt able to identify she needed more time to realistically complete her morning routine. Requested pt ID solutions for next ST session.  PATIENT EDUCATION: Education details: see above Person educated: Patient Education  method: Customer service manager Education comprehension: verbalized understanding, returned demonstration, and needs further education     GOALS: Goals reviewed with patient? Yes   SHORT TERM GOALS: Target date: 11/05/2021    Pt will complete clinical swallow assessment in first 1-2 ST sessions and pt will complete MBSS as needed for additional assessment per SLP recommendation  Baseline: Goal status: Met (consider future MBSS referral pending implementation of swallow precautions)   2.  Pt will utilize dysarthria compensations to optimize vocal intensity and clarity in short structured conversations given occasional mod A over 2 sessions Baseline: 10-29-21, 11-03-21 Goal status: Met   3.  Pt will utilize word retrieval strategies to aid anomia/dysnomia on structured speech tasks given occasional mod A over 2 sessions Baseline:  Goal status: Deferred (not reported over last 4 sessions)   4.  Pt will implement attention/processing strategies in short structured conversations to aid patient engagement given occasional mod A over 2 sessions  Baseline: 10-22-21, 10-29-21 Goal status: Met   5.  Pt will implement memory compensations to optimize performance at work and home given occasional mod A over 2 sessions Baseline: 10-22-21, 10-29-21 Goal status: Met   6.  Family will utilize techniques to aid patient processing speed and comprehension given occasional min A over 2 sessions  Baseline: 10-22-21, 10-27-21 Goal status: Met    LONG TERM GOALS: Target date: 12/04/2021     Pt will implement attention/processing strategies in 10-15 minute conversations to aid patient engagement given occasional min A over 2 sessions  Baseline: 11-11-21 Goal status: ongoing   2.  Pt will demonstrate ability to ID and correct/compensate for anomia/dysnomia in 10-15 minute conversations given occasional min A over 2 sessions Baseline: 11-05-21 Goal status: ongoing   3.  Pt will implement memory compensations  to optimize performance at work and home given occasional min A over 2 sessions Baseline:  Goal status: ongoing   4.  Family will utilize techniques to aid patient processing speed and comprehension given rare min A over 2 sessions  Baseline:  Goal status: ongoing   5.  Pt will consume safest and least restrictive diet with reduced s/sx of aspiration with use of trained techniques as needed over 2 sessions Baseline: 11-11-21 Goal status: ongoing   ASSESSMENT:   CLINICAL IMPRESSION: Patient is a 71 y.o. 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 Targeted cognitive communication strategies/tasks to implement at home and work to aid cognitive functioning (processing, recall, attention) with support from family requested to aid carryover. Good carryover of recommended SLP techniques and strategies reported thus far. Pt would continue to benefit from skilled ST intervention to optimize functional independence and safety.    OBJECTIVE IMPAIRMENTS  Objective impairments include attention, memory, executive functioning, expressive language, dysarthria, and dysphagia. These impairments are limiting patient from household responsibilities, ADLs/IADLs, effectively communicating at home and in community, and safety when swallowing.Factors affecting potential to achieve goals and functional outcome are ability to learn/carryover information, cooperation/participation level, medical prognosis, and previous level of function. Patient will benefit from skilled SLP services to address above impairments and improve overall function.   REHAB POTENTIAL: Fair - hx of reduced carryover of HEP   PLAN: SLP FREQUENCY: 2x/week   SLP DURATION: 8 weeks   PLANNED INTERVENTIONS: Language facilitation, Environmental controls, Cueing hierachy, Cognitive reorganization, Internal/external aids, Functional tasks, Multimodal communication approach, SLP instruction and feedback, Compensatory strategies, and  Patient/family education  Marzetta Board, CCC-SLP 11/11/2021, 12:33 PM

## 2021-11-11 NOTE — Therapy (Signed)
OUTPATIENT PHYSICAL THERAPY NEURO TREATMENT/10th VISIT PN   Patient Name: April Larson MRN: [id number] DOB:[birth year], 71 y.o., female Today's Date: 11/11/2021  10th Visit Physical Therapy Progress Note  Dates of Reporting Period: 10/08/21 to 11/11/21  PCP: Glendale Chard, MD REFERRING PROVIDER: [name], MD    PT End of Session - 11/11/21 1151     Visit Number 10    Number of Visits 17   Plus eval   Date for PT Re-Evaluation 12/10/21    Authorization Type [employer] Medicare    Progress Note Due on Visit 10    PT Start Time 1148    PT Stop Time 1230    PT Time Calculation (min) 42 min    Activity Tolerance Patient tolerated treatment well    Behavior During Therapy WFL for tasks assessed/performed                Past Medical History:  Diagnosis Date   Diabetes mellitus without complication ([place])    Hypertension    Substance abuse ([place])    Past Surgical History:  Procedure Laterality Date   EYE SURGERY     Patient Active Problem List   Diagnosis Date Noted   Decreased dorsalis pedis pulse 11/17/2020   Tachycardia 08/11/2020   Cough 08/11/2020   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   Pure hypercholesterolemia 01/23/2020   Vitamin D deficiency 10/23/2019   Right hip pain 05/15/2018   Estrogen deficiency 05/15/2018   Class 1 obesity due to excess calories with serious comorbidity and body mass index (BMI) of 31.0 to 31.9 in adult 05/15/2018   Diabetes mellitus with stage 2 chronic kidney disease ([place]) 01/11/2018   Benign hypertension with chronic kidney disease, stage II 01/11/2018    ONSET DATE: 08/25/2021 (referral)  REFERRING DIAG: R47.1 (ICD-10-CM) - Dysarthria and anarthria R41.841 (ICD-10-CM) - Cognitive communication deficit R13.10 (ICD-10-CM) - Dysphagia, unspecified type R27.8 (ICD-10-CM) - Other lack of coordination R26.81 (ICD-10-CM) - Unsteadiness on feet R29.3 (ICD-10-CM) - Abnormal posture G20 (ICD-10-CM) - Primary parkinsonism  ([place])   THERAPY DIAG:  Unsteadiness on feet  Muscle weakness (generalized)  Other symptoms and signs involving the nervous system  Abnormal posture  Rationale for Evaluation and Treatment Rehabilitation  PERTINENT HISTORY: HTN, DM, high chol, CKD, Supranuclear Palsy  PRECAUTIONS: Fall  PATIENT GOALS "I wanna work on walking"   OBJECTIVE:   COGNITION: Overall cognitive status: No family/caregiver present to determine baseline cognitive functioning   SUBJECTIVE: Worked last night at [employer]. Was able to sit down and point. No falls. Still having some trouble with the sit <> stands at home.                                        PAIN:  Are you having pain? No    TODAY'S TREATMENT:  Therapeutic Exercise:  Scifit BUE/BLE's level 4.0 x 8 minutes with goal >/= 80 steps per minute for strengthening, reciprocal movement training and activity tolerance. Pt reported fatigue afterwards with increased resistance today.   NMR Sit <> stands from elevated mat table with rockerboard in A/P direction under pt to help facilitate anterior weight shift with sit > stand, needing cues for incr forward lean and with tall posture in standing and then bending from knees/hips to slowly sit back down to the mat without BLE bracing for eccentric control. Pt initially with BLE bracing against mat table due  to not getting weight far enough anteriorly (pt would have tendency to shift weight anteriorly initially with nose over toes, but was not able to maintain it and then would lose balance backwards). Performed x12 reps, with pt improving with incr reps. Remainder of session performed sit <> stands with pt able to perform with proper technique as long as she was focused on the task.  On blue foam beam with wide BOS, performed trunk rotations and reaching outside of BOS and tracking with eyes to grab bean bag, return to midline, and tossing BIG into crate. Performed x10 reps each side, pt more  challenged with tossing with RUE. Cued to reset in the middle with tall posture. Pt only with one episode of retropulsion.  In front of rebounder, performed forward stepping and tossing small green ball BIG into trampoline and then catching it. Pt initially not tossing ball with enough effort and it would bounce to the ground and pt wouldn't be able to catch it. When pt tossing with incr effort was then able to catch it. Performed x10 reps each leg, alternating.      PATIENT EDUCATION: Education details: Continue with current HEP Person educated: Patient Education method: Customer service manager Education comprehension: verbalized understanding    HOME EXERCISE PROGRAM: Access Code: [email or web address], Standing PWR moves.      GOALS: Goals reviewed with patient? Yes  SHORT TERM GOALS: Target date: 11/05/2021  Pt will be independent with initial HEP for improved strength, balance, transfers and gait. Baseline: Has HEP from previous bout of therapy, needs to be reviewed; pt reports she performs every day  Goal status: MET  2.  Pt will verbalize and demonstrate fall prevention strategies in the home and community for reduced fall risk and frequency.  Baseline: Pt able to verbalize reduction of throw rugs, having well-lit areas and using rails on stairs  Goal status: MET  3.  Pt will improve cog TUG to less than or equal to 11 seconds for improved functional mobility and decreased fall risk. Baseline: 13.25s; 15.55s on 8/3 Goal status: NOT MET  4.  Pt will improve 5 x STS to less than or equal to 13 seconds without UE support and absent retropulsion to demonstrate improved functional strength and transfer efficiency.  Baseline: 16.15s w/retropulsion; 19.82s without UE support, retropulsion on first rep  Goal status: IN PROGRESS  LONG TERM GOALS: Target date: 12/03/2021  Pt will be independent with final HEP for improved strength, balance, transfers and gait. Baseline:  Goal status:  INITIAL  2.  Pt will verbalize understanding of local PD community resources, including fitness post DC.  Baseline:  Goal status: INITIAL  3.  Pt will improve MiniBest to 21/28 for decreased fall risk and improvement with compensatory stepping strategies.  Baseline: 16/28 Goal status: INITIAL  4. Pt will improve 5 x STS to less than or equal to 13 seconds without UE support and absent retropulsion to demonstrate improved functional strength and transfer efficiency.  Baseline: 16.15s w/retropulsion; 19.82s without UE support, retropulsion on first rep on 8/2  Goal status: NEW   ASSESSMENT:  CLINICAL IMPRESSION: 10th visit PN: STGs assessed at previous session. Emphasis of skilled PT session on STG assessment and transfers. Pt has met 2/4 STGs, stating she performs her HEP daily and has taken steps at home for fall risk prevention. Pt able to perform sit <> stands without UE support, but really needs to focus for proper technique to pt does not have episodes of BLE bracing  and retropulsion against mat table. Today's skilled session continued to focus on strengthening/activity tolerance, balance with stepping strategies/trunk rotations and anterior weight shifting with sit <> stands. Pt needs cues to make sure that she is maintaining her forward/anterior weight shift throughout sit > stand.  Pt tolerated session well, will continue to progress towards LTGs.    OBJECTIVE IMPAIRMENTS decreased activity tolerance, decreased balance, decreased cognition, decreased coordination, decreased knowledge of condition, decreased knowledge of use of DME, decreased mobility, difficulty walking, decreased strength, decreased safety awareness, impaired perceived functional ability, impaired vision/preception, and improper body mechanics.    ACTIVITY LIMITATIONS carrying, lifting, bending, sitting, standing, squatting, stairs, transfers, and locomotion level   PARTICIPATION LIMITATIONS: meal prep, cleaning,  laundry, medication management, driving, shopping, community activity, and yard work   PERSONAL FACTORS Behavior pattern, Fitness, Transportation, and 1 comorbidity: lack of support in home environment  are also affecting patient's functional outcome.    REHAB POTENTIAL: Good   CLINICAL DECISION MAKING: Evolving/moderate complexity   EVALUATION COMPLEXITY: Moderate  PLAN: PT FREQUENCY: 2x/week  PT DURATION: 8 weeks  PLANNED INTERVENTIONS: Therapeutic exercises, Therapeutic activity, Neuromuscular re-education, Balance training, Gait training, Patient/Family education, Stair training, DME instructions, Manual therapy, and Re-evaluation  PLAN FOR NEXT SESSION:   continue variable sit <>stands, Visual scanning. Stepping strategies. Balance and SLS tasks. Try standing PWR moves on compliant surfaces.   Janann August, PT, DPT 11/11/21 1:04 PM

## 2021-11-17 ENCOUNTER — Encounter: Payer: Self-pay | Admitting: Internal Medicine

## 2021-11-17 ENCOUNTER — Ambulatory Visit (INDEPENDENT_AMBULATORY_CARE_PROVIDER_SITE_OTHER): Payer: Medicare HMO | Admitting: Internal Medicine

## 2021-11-17 VITALS — BP 128/80 | HR 90 | Temp 98.1°F | Ht 63.0 in | Wt 163.0 lb

## 2021-11-17 DIAGNOSIS — R0989 Other specified symptoms and signs involving the circulatory and respiratory systems: Secondary | ICD-10-CM | POA: Diagnosis not present

## 2021-11-17 DIAGNOSIS — N182 Chronic kidney disease, stage 2 (mild): Secondary | ICD-10-CM

## 2021-11-17 DIAGNOSIS — Z0000 Encounter for general adult medical examination without abnormal findings: Secondary | ICD-10-CM | POA: Diagnosis not present

## 2021-11-17 DIAGNOSIS — I129 Hypertensive chronic kidney disease with stage 1 through stage 4 chronic kidney disease, or unspecified chronic kidney disease: Secondary | ICD-10-CM | POA: Diagnosis not present

## 2021-11-17 DIAGNOSIS — E1122 Type 2 diabetes mellitus with diabetic chronic kidney disease: Secondary | ICD-10-CM

## 2021-11-17 DIAGNOSIS — G20 Parkinson's disease: Secondary | ICD-10-CM

## 2021-11-17 DIAGNOSIS — Z6828 Body mass index (BMI) 28.0-28.9, adult: Secondary | ICD-10-CM

## 2021-11-17 DIAGNOSIS — E7800 Pure hypercholesterolemia, unspecified: Secondary | ICD-10-CM | POA: Diagnosis not present

## 2021-11-17 LAB — POCT URINALYSIS DIPSTICK
Bilirubin, UA: NEGATIVE
Blood, UA: NEGATIVE
Glucose, UA: NEGATIVE
Ketones, UA: NEGATIVE
Nitrite, UA: NEGATIVE
Protein, UA: NEGATIVE
Spec Grav, UA: 1.025 (ref 1.010–1.025)
Urobilinogen, UA: 0.2 E.U./dL
pH, UA: 6 (ref 5.0–8.0)

## 2021-11-17 MED ORDER — GLUCOSE BLOOD VI STRP: ORAL_STRIP | 4 refills | Status: DC

## 2021-11-17 NOTE — Patient Instructions (Signed)

## 2021-11-17 NOTE — Progress Notes (Signed)
I,April Larson,acting as a scribe for April Greenland, MD.,have documented all relevant documentation on the behalf of April Greenland, MD,as directed by  April Greenland, MD while in the presence of April Greenland, MD.   Subjective:     Patient ID: April Larson , female    DOB: Oct 23, 1950 , 71 y.o.   MRN: [id number]   Chief Complaint  Patient presents with  . Annual Exam    HPI  The patient is here today for a physical examination. She is followed by GYN in [place]. She can't recall her last visit. She has no specific concerns or complaints at this time. She is accompanied by her sister today. She reports compliance with meds. She denies headaches, chest pain and/or shortness of breath.  Sister reports neurological condition has improved with meds.   Diabetes She presents for her follow-up diabetic visit. She has type 2 diabetes mellitus. Her disease course has been stable. There are no hypoglycemic associated symptoms. Pertinent negatives for hypoglycemia include no headaches. Pertinent negatives for diabetes include no blurred vision and no chest pain. There are no hypoglycemic complications. Diabetic complications include nephropathy. Risk factors for coronary artery disease include diabetes mellitus, dyslipidemia, hypertension, post-menopausal and sedentary lifestyle. Current diabetic treatment includes insulin injections. She is compliant with treatment most of the time. Her weight is fluctuating minimally. She is following a diabetic diet. She participates in exercise intermittently. Her home blood glucose trend is fluctuating minimally. Her breakfast blood glucose is taken between 8-9 am. Her breakfast blood glucose range is generally 70-90 mg/dl. An ACE inhibitor/angiotensin II receptor blocker is being taken.  Hypertension This is a chronic problem. The current episode started more than 1 year ago. The problem is controlled (FAIR CONTROL). Pertinent negatives include  no anxiety, blurred vision, chest pain or headaches. The current treatment provides moderate improvement. Compliance problems include exercise.  Hypertensive end-organ damage includes kidney disease.     Past Medical History:  Diagnosis Date  . Diabetes mellitus without complication ([place])   . Hypertension   . Substance abuse ([place])      Family History  Problem Relation Age of Onset  . Cancer Mother   . Diabetes Father   . Cancer Brother   . Breast cancer Maternal Aunt   . Parkinsonism Neg Hx      Current Outpatient Medications:  .  ACCU-CHEK FASTCLIX LANCETS MISC, USE TO CHECK BLOOD SUGARS TWICE DAILY, Disp: 100 each, Rfl: 11 .  aspirin EC 81 MG tablet, Take 1 tablet (81 mg total) by mouth daily., Disp: 90 tablet, Rfl: 1 .  atorvastatin (LIPITOR) 10 MG tablet, Take 1 tablet (10 mg total) by mouth daily., Disp: 90 tablet, Rfl: 0 .  Biotin 10 MG CAPS, Take 1 tablet by mouth daily., Disp: 90 capsule, Rfl: 1 .  calcium carbonate (OSCAL) 1500 (600 Ca) MG TABS tablet, Take 1 tablet (1,500 mg total) by mouth daily with breakfast., Disp: 90 tablet, Rfl: 1 .  carbidopa-levodopa (SINEMET IR) 25-100 MG tablet, TAKE ONE TABLET BY MOUTH four TIMES DAILY. FOLLOW titration instructions provided separately in writing, Disp: 360 tablet, Rfl: 2 .  Cholecalciferol 125 MCG (5000 UT) TABS, Take 1 tablet (5,000 Units total) by mouth daily., Disp: 90 tablet, Rfl: 1 .  diclofenac Sodium (VOLTAREN) 1 % GEL, APPLY 2 GRAMS TOPICALLY TO THE AFFECTED AREA FOUR TIMES DAILY, Disp: 100 g, Rfl: 2 .  fluticasone (FLONASE) 50 MCG/ACT nasal spray, instill TWO SPRAYS into each  nostril daily AS DIRECTED, Disp: 16 g, Rfl: 2 .  glucose blood test strip, Use as instructed to check BS twice daily DX: E11.22, Disp: 180 each, Rfl: 4 .  Insulin Pen Needle (B-D ULTRAFINE III SHORT PEN) 31G X 8 MM MISC, Use as directed, Disp: 100 each, Rfl: 11 .  loratadine (CLARITIN) 10 MG tablet, Take 1 tablet by mouth daily, Disp: 90 tablet,  Rfl: 1 .  metFORMIN (GLUCOPHAGE) 500 MG tablet, Take 1 tablet (500 mg total) by mouth daily with breakfast. Dinner, Disp: 180 tablet, Rfl: 1 .  Multiple Vitamins-Minerals (ONE-A-DAY WOMENS 50+ ADVANTAGE) TABS, Take 1 tablet by mouth daily, Disp: 90 tablet, Rfl: 1 .  NON FORMULARY, 2 each. Calm, Disp: , Rfl:  .  Omega-3 Fatty Acids (FISH OIL) 1000 MG CAPS, Take 1 capsule by mouth daily, Disp: 90 capsule, Rfl: 1 .  omeprazole (PRILOSEC) 40 MG capsule, TAKE 1 CAPSULE BY MOUTH EVERY DAY BEFORE A MEAL, Disp: 90 capsule, Rfl: 2 .  telmisartan-hydrochlorothiazide (MICARDIS HCT) 40-12.5 MG tablet, Take 1 tablet by mouth daily., Disp: 90 tablet, Rfl: 1 .  Turmeric 500 MG CAPS, Take 1 capsule by mouth 2 (two) times daily., Disp: 180 capsule, Rfl: 1 .  vitamin C (ASCORBIC ACID) 500 MG tablet, Take 1 tablet (500 mg total) by mouth daily., Disp: 90 tablet, Rfl: 1 .  XULTOPHY 100-3.6 UNIT-MG/ML SOPN, INJECT 25 UNITS SUBCUTANEOUSLY DAILY, Disp: 27 mL, Rfl: 3   No Known Allergies    The patient states she uses post menopausal status for birth control. Last LMP was No LMP recorded. Patient is postmenopausal.. Negative for Dysmenorrhea. Negative for: breast discharge, breast lump(s), breast pain and breast self exam. Associated symptoms include abnormal vaginal bleeding. Pertinent negatives include abnormal bleeding (hematology), anxiety, decreased libido, depression, difficulty falling sleep, dyspareunia, history of infertility, nocturia, sexual dysfunction, sleep disturbances, urinary incontinence, urinary urgency, vaginal discharge and vaginal itching. Diet regular.The patient states her exercise level is  intermittent.  . The patient's tobacco use is:  Social History   Tobacco Use  Smoking Status Former  . Packs/day: 1.00  . Years: 34.00  . Total pack years: 34.00  . Types: Cigarettes  . Start date: 22  . Quit date: 04/08/2005  . Years since quitting: 16.6  Smokeless Tobacco Never  . She has been  exposed to passive smoke. The patient's alcohol use is:  Social History   Substance and Sexual Activity  Alcohol Use No  . Alcohol/week: 0.0 standard drinks of alcohol    Review of Systems  Constitutional: Negative.   HENT: Negative.    Eyes: Negative.  Negative for blurred vision.  Respiratory: Negative.    Cardiovascular: Negative.  Negative for chest pain.  Gastrointestinal: Negative.   Endocrine: Negative.   Genitourinary: Negative.   Musculoskeletal: Negative.   Skin: Negative.   Allergic/Immunologic: Negative.   Neurological: Negative.  Negative for headaches.  Hematological: Negative.   Psychiatric/Behavioral: Negative.       Today's Vitals   11/17/21 0905  BP: 128/80  Pulse: 90  Temp: 98.1 F (36.7 C)  SpO2: 96%  Weight: 163 lb (73.9 kg)  Height: _0  (1.6 m)  PainSc: 0-No pain   Body mass index is 28.87 kg/m.  Wt Readings from Last 3 Encounters:  11/17/21 163 lb (73.9 kg)  11/03/21 165 lb 12.8 oz (75.2 kg)  10/27/21 167 lb 3.2 oz (75.8 kg)    Objective:  Physical Exam Vitals and nursing note reviewed.  Constitutional:  Appearance: Normal appearance.  HENT:     Head: Normocephalic and atraumatic.     Right Ear: Tympanic membrane, ear canal and external ear normal.     Left Ear: Tympanic membrane, ear canal and external ear normal.     Nose: Nose normal.     Mouth/Throat:     Mouth: Mucous membranes are moist.     Pharynx: Oropharynx is clear.  Eyes:     Extraocular Movements: Extraocular movements intact.     Conjunctiva/sclera: Conjunctivae normal.     Pupils: Pupils are equal, round, and reactive to light.  Cardiovascular:     Rate and Rhythm: Normal rate and regular rhythm.     Pulses:          Dorsalis pedis pulses are 1+ on the right side and 1+ on the left side.     Heart sounds: Normal heart sounds.  Pulmonary:     Effort: Pulmonary effort is normal.     Breath sounds: Normal breath sounds.  Chest:  Breasts:    Tanner Score is  5.     Right: Normal.     Left: Normal.  Abdominal:     General: Bowel sounds are normal.     Palpations: Abdomen is soft.  Genitourinary:    Comments: deferred Musculoskeletal:        General: Normal range of motion.     Cervical back: Normal range of motion and neck supple.  Feet:     Right foot:     Protective Sensation: 5 sites tested.  5 sites sensed.     Skin integrity: Dry skin present.     Toenail Condition: Right toenails are abnormally thick.     Left foot:     Protective Sensation: 5 sites tested.  5 sites sensed.     Skin integrity: Callus and dry skin present.     Toenail Condition: Left toenails are abnormally thick.  Skin:    General: Skin is warm and dry.  Neurological:     Mental Status: She is alert and oriented to person, place, and time. Mental status is at baseline.  Psychiatric:        Mood and Affect: Mood normal.        Behavior: Behavior normal.        Assessment And Plan:     1. Encounter for annual physical exam Comments: A full exam was performed. Importance of monthly self breast exams was discussed with the patient.  PATIENT IS ADVISED TO GET 30-45 MINUTES REGULAR EXERCISE NO LESS THAN FOUR TO FIVE DAYS PER WEEK - BOTH WEIGHTBEARING EXERCISES AND AEROBIC ARE RECOMMENDED.  PATIENT IS ADVISED TO FOLLOW A HEALTHY DIET WITH AT LEAST SIX FRUITS/VEGGIES PER DAY, DECREASE INTAKE OF RED MEAT, AND TO INCREASE FISH INTAKE TO TWO DAYS PER WEEK.  MEATS/FISH SHOULD NOT BE FRIED, BAKED OR BROILED IS PREFERABLE.  IT IS ALSO IMPORTANT TO CUT BACK ON YOUR SUGAR INTAKE. PLEASE AVOID ANYTHING WITH ADDED SUGAR, CORN SYRUP OR OTHER SWEETENERS. IF YOU MUST USE A SWEETENER, YOU CAN TRY STEVIA. IT IS ALSO IMPORTANT TO AVOID ARTIFICIALLY SWEETENERS AND DIET BEVERAGES. LASTLY, I SUGGEST WEARING SPF 50 SUNSCREEN ON EXPOSED PARTS AND ESPECIALLY WHEN IN THE DIRECT SUNLIGHT FOR AN EXTENDED PERIOD OF TIME.  PLEASE AVOID FAST FOOD RESTAURANTS AND INCREASE YOUR WATER INTAKE.  2.  Diabetes mellitus with stage 2 chronic kidney disease ([place]) Comments: Chronic, diabetic foot exam was performed. She will rto in 41month. I DISCUSSED WITH  THE PATIENT AT LENGTH REGARDING THE GOALS OF GLYCEMIC CONTROL AND POSSIBLE LONG-TERM COMPLICATIONS.  I  ALSO STRESSED THE IMPORTANCE OF COMPLIANCE WITH HOME GLUCOSE MONITORING, DIETARY RESTRICTIONS INCLUDING AVOIDANCE OF SUGARY DRINKS/PROCESSED FOODS,  ALONG WITH REGULAR EXERCISE.  I  ALSO STRESSED THE IMPORTANCE OF ANNUAL EYE EXAMS, SELF FOOT CARE AND COMPLIANCE WITH OFFICE VISITS. - POCT Urinalysis Dipstick (81002) - Microalbumin / creatinine urine ratio - EKG 12-Lead - CMP14+EGFR - Hemoglobin A1c - Lipid panel - TSH  3. Hypertensive nephropathy Comments: Chronic, well controlled. EKG performed, NSR w/o acute changes.  She will c/w telmisartan/hct 40/12.51m daily. F/u 6 months.  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 She is encouraged to continue with PT/OT and to perform the exercises at home on her own.   5. Pure hypercholesterolemia Comments: Chronic, LDL goal <70. She is encouraged to c/w atorvastatin 468mdaily, avoid fried foods and aim for at least 150 minutes of exercise per week.  - Lipid panel  6. Decreased dorsalis pedis pulse - VAS [place] WITH/WO TBI; Future  7. Body mass index (BMI) of 28.0 to 28.9 in adult Comments: Her BMi is acceptable for her demographic. She is encouraged to aim for at least 150 minutes of exercise per week.   Patient was given opportunity to ask questions. Patient verbalized understanding of the plan and was able to repeat key elements of the plan. All questions were answered to their satisfaction.   I, RoMaximino GreenlandMD, have reviewed all documentation for this visit. The documentation on 11/17/21 for the exam, diagnosis, procedures, and  orders are all accurate and complete.   THE PATIENT IS ENCOURAGED TO PRACTICE SOCIAL DISTANCING DUE TO THE COVID-19 PANDEMIC.

## 2021-11-18 ENCOUNTER — Other Ambulatory Visit: Payer: Self-pay | Admitting: Internal Medicine

## 2021-11-18 ENCOUNTER — Encounter: Payer: Self-pay | Admitting: Physical Therapy

## 2021-11-18 ENCOUNTER — Ambulatory Visit: Payer: Medicare HMO | Admitting: Occupational Therapy

## 2021-11-18 ENCOUNTER — Encounter: Payer: Self-pay | Admitting: Speech Pathology

## 2021-11-18 ENCOUNTER — Ambulatory Visit: Payer: Medicare HMO | Admitting: Physical Therapy

## 2021-11-18 ENCOUNTER — Ambulatory Visit: Payer: Medicare HMO | Admitting: Speech Pathology

## 2021-11-18 DIAGNOSIS — R41840 Attention and concentration deficit: Secondary | ICD-10-CM | POA: Diagnosis not present

## 2021-11-18 DIAGNOSIS — R293 Abnormal posture: Secondary | ICD-10-CM

## 2021-11-18 DIAGNOSIS — M25621 Stiffness of right elbow, not elsewhere classified: Secondary | ICD-10-CM

## 2021-11-18 DIAGNOSIS — R41842 Visuospatial deficit: Secondary | ICD-10-CM | POA: Diagnosis not present

## 2021-11-18 DIAGNOSIS — R2681 Unsteadiness on feet: Secondary | ICD-10-CM | POA: Diagnosis not present

## 2021-11-18 DIAGNOSIS — R471 Dysarthria and anarthria: Secondary | ICD-10-CM

## 2021-11-18 DIAGNOSIS — R278 Other lack of coordination: Secondary | ICD-10-CM | POA: Diagnosis not present

## 2021-11-18 DIAGNOSIS — R2689 Other abnormalities of gait and mobility: Secondary | ICD-10-CM | POA: Diagnosis not present

## 2021-11-18 DIAGNOSIS — M6281 Muscle weakness (generalized): Secondary | ICD-10-CM

## 2021-11-18 DIAGNOSIS — R29818 Other symptoms and signs involving the nervous system: Secondary | ICD-10-CM | POA: Diagnosis not present

## 2021-11-18 DIAGNOSIS — R1310 Dysphagia, unspecified: Secondary | ICD-10-CM | POA: Diagnosis not present

## 2021-11-18 DIAGNOSIS — R41841 Cognitive communication deficit: Secondary | ICD-10-CM | POA: Diagnosis not present

## 2021-11-18 LAB — LIPID PANEL
Chol/HDL Ratio: 2.7 ratio (ref 0.0–4.4)
Cholesterol, Total: 174 mg/dL (ref 100–199)
HDL: 65 mg/dL (ref >39)
LDL Chol Calc (NIH): 96 mg/dL (ref 0–99)
Triglycerides: 67 mg/dL (ref 0–149)
VLDL Cholesterol Cal: 13 mg/dL (ref 5–40)

## 2021-11-18 LAB — MICROALBUMIN / CREATININE URINE RATIO
Creatinine, Urine: 115.3 mg/dL
Microalb/Creat Ratio: 10 mg/g creat (ref 0–29)
Microalbumin, Urine: 11.7 ug/mL

## 2021-11-18 LAB — CMP14+EGFR
ALT: 6 IU/L (ref 0–32)
AST: 14 IU/L (ref 0–40)
Albumin/Globulin Ratio: 1.3 (ref 1.2–2.2)
Albumin: 4.2 g/dL (ref 3.9–4.9)
Alkaline Phosphatase: 135 IU/L — ABNORMAL HIGH (ref 44–121)
BUN/Creatinine Ratio: 21 (ref 12–28)
BUN: 17 mg/dL (ref 8–27)
Bilirubin Total: 0.3 mg/dL (ref 0.0–1.2)
CO2: 22 mmol/L (ref 20–29)
Calcium: 10 mg/dL (ref 8.7–10.3)
Chloride: 104 mmol/L (ref 96–106)
Creatinine, Ser: 0.82 mg/dL (ref 0.57–1.00)
Globulin, Total: 3.2 g/dL (ref 1.5–4.5)
Glucose: 86 mg/dL (ref 70–99)
Potassium: 4.5 mmol/L (ref 3.5–5.2)
Sodium: 141 mmol/L (ref 134–144)
Total Protein: 7.4 g/dL (ref 6.0–8.5)
eGFR: 77 mL/min/{1.73_m2} (ref >59)

## 2021-11-18 LAB — HEMOGLOBIN A1C
Est. average glucose Bld gHb Est-mCnc: 134 mg/dL
Hgb A1c MFr Bld: 6.3 % — ABNORMAL HIGH (ref 4.8–5.6)

## 2021-11-18 LAB — TSH: TSH: 2.75 u[IU]/mL (ref 0.450–4.500)

## 2021-11-18 NOTE — Patient Instructions (Addendum)
Memory Compensation Strategies  Use "WARM" strategy. W= write it down A=  associate it R=  repeat it M=  make a mental picture  You can keep a Memory Notebook. Use a 3-ring notebook with sections for the following:  calendar, important names and phone numbers, medications, doctors' names/phone numbers, "to do list"/reminders, and a section to journal what you did each day  Use a calendar to write appointments down.  Write yourself a schedule for the day.  This can be placed on the calendar or in a separate section of the Memory Notebook.  Keeping a regular schedule can help memory.  Use medication organizer with sections for each day or morning/evening pills  You may need help loading it  Keep a basket, or pegboard by the door.   Place items that you need to take out with you in the basket or on the pegboard.  You may also want to include a message board for reminders.  Use sticky notes. Place sticky notes with reminders in a place where the task is performed.  For example:  "turn off the stove" placed by the stove, "lock the door" placed on the door at eye level, "take your medications" on the bathroom mirror or by the place where you normally take your medications  Use alarms, timers, and/or a reminder app. Use while cooking to remind yourself to check on food or as a reminder to take your medicine, or as a reminder to make a call, or as a reminder to perform another task, etc.  Use a voice recorder app or small tape recorder to record important information and notes for yourself. Go back at the end of the day and listen to these.    Ways to prevent future Parkinson's related complications: 1.   Exercise regularly,  2.   Focus on BIGGER movements during daily activities- really reach overhead, straighten elbows and extend fingers 3.   When dressing or reaching for your seatbelt make sure to use your body to assist by twisting while you reach-this can help to minimize stress on the  shoulder and reduce the risk of a rotator cuff tear 4.   Swing your arms when you walk! People with PD are at increased risk for frozen shoulder and swinging your arms can reduce this risk.

## 2021-11-18 NOTE — Therapy (Signed)
OUTPATIENT PHYSICAL THERAPY NEURO TREATMENT/10th VISIT PN   Patient Name: April Larson MRN: [id number] DOB:1950/10/15, 71 y.o., female Today's Date: 11/18/2021  10th Visit Physical Therapy Progress Note  Dates of Reporting Period: 10/08/21 to 11/11/21  PCP: Gl[place]ale Chard, MD REFERRING PROVIDER: [name], MD    PT End of Session - 11/18/21 1021     Visit Number 11    Number of Visits 17   Plus eval   Date for PT Re-Evaluation 12/10/21    Authorization Type [employer]    Progress Note Due on Visit 72    PT Start Time 1020    PT Stop Time 1100    PT Time Calculation (min) 40 min    Activity Tolerance Patient tolerated treatment well    Behavior During Therapy WFL for tasks assessed/performed                Past Medical History:  Diagnosis Date   Diabetes mellitus without complication ([place])    Hypertension    Substance abuse ([place])    Past Surgical History:  Procedure Laterality Date   EYE SURGERY     Patient Active Problem List   Diagnosis Date Noted   Decreased dorsalis pedis pulse 11/17/2020   Tachycardia 08/11/2020   Cough 08/11/2020   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   Pure hypercholesterolemia 01/23/2020   Vitamin D deficiency 10/23/2019   Right hip pain 05/15/2018   Estrogen deficiency 05/15/2018   Class 1 obesity due to excess calories with serious comorbidity and body mass index (BMI) of 31.0 to 31.9 in adult 05/15/2018   Diabetes mellitus with stage 2 chronic kidney disease ([place]) 01/11/2018   Benign hypertension with chronic kidney disease, stage II 01/11/2018    ONSET DATE: 08/25/2021 (referral)  REFERRING DIAG: R47.1 (ICD-10-CM) - Dysarthria and anarthria R41.841 (ICD-10-CM) - Cognitive communication deficit R13.10 (ICD-10-CM) - Dysphagia, unspecified type R27.8 (ICD-10-CM) - Other lack of coordination R26.81 (ICD-10-CM) - Unsteadiness on feet R29.3 (ICD-10-CM) - Abnormal posture G20 (ICD-10-CM) - Primary parkinsonism  ([place])   THERAPY DIAG:  Abnormal posture  Unsteadiness on feet  Muscle weakness (generalized)  Other abnormalities of gait and mobility  Rationale for Evaluation and Treatment Rehabilitation  PERTINENT HISTORY: HTN, DM, high chol, CKD, Supranuclear Palsy  PRECAUTIONS: Fall  PATIENT GOALS "I wanna work on walking"   OBJECTIVE:   COGNITION: Overall cognitive status: No family/caregiver present to determine baseline cognitive functioning   SUBJECTIVE: No new complaints. No falls or pain. HEP is going well.                                         PAIN:  Are you having pain? No    TODAY'S TREATMENT:   STRENGTHENING  Scifit BUE/BLE's level 4.2 x 8 minutes with goal >/= 80 steps per minute for strengthening, reciprocal movement training and activity tolerance. Pt reported feeling like she worked at "maximal" exertion afterwards.  BALANCE/NMR: Seated at edge of mat:  - feet on balance board in anterior/posterior direction for sit<>stands x 10 reps with continued cues for increased anterior weight shifting.  - feet on blue wedge with toes pointed to smaller side for sit<>stands  Standing on blue mat next to mat table: had pt perform ex's for large amplitude movements as follow: For posture- cues for forward reaching with mini squat and moving arms while standing back up tall in fluid motion  x 10 reps For weight shifting-alternating lateral weight shifting with same side UE reaching up toward ceiling. Cues to track/look to hand that is reaching up each time.    GAIT: Gait pattern: step through pattern, decreased stride length, decreased trunk rotation, and narrow BOS Distance walked: around clinic with session Assistive device utilized: None Level of assistance: SBA Comments: cues to scan environment/move head      PATIENT EDUCATION: Education details: Continue with current HEP Person educated: Patient Education method: Customer service manager Education  comprehension: verbalized understanding    HOME EXERCISE PROGRAM: Access Code: [email or web address], Standing PWR moves.      GOALS: Goals reviewed with patient? Yes  SHORT TERM GOALS: Target date: 11/05/2021  Pt will be independent with initial HEP for improved strength, balance, transfers and gait. Baseline: Has HEP from previous bout of therapy, needs to be reviewed; pt reports she performs every day  Goal status: MET  2.  Pt will verbalize and demonstrate fall prevention strategies in the home and community for reduced fall risk and frequency.  Baseline: Pt able to verbalize reduction of throw rugs, having well-lit areas and using rails on stairs  Goal status: MET  3.  Pt will improve cog TUG to less than or equal to 11 seconds for improved functional mobility and decreased fall risk. Baseline: 13.25s; 15.55s on 8/3 Goal status: NOT MET  4.  Pt will improve 5 x STS to less than or equal to 13 seconds without UE support and absent retropulsion to demonstrate improved functional strength and transfer efficiency.  Baseline: 16.15s w/retropulsion; 19.82s without UE support, retropulsion on first rep on 8/2 Goal status: NOT MET  LONG TERM GOALS: Target date: 12/03/2021  Pt will be independent with final HEP for improved strength, balance, transfers and gait. Baseline:  Goal status: INITIAL  2.  Pt will verbalize understanding of local PD community resources, including fitness post DC.  Baseline:  Goal status: INITIAL  3.  Pt will improve MiniBest to 21/28 for decreased fall risk and improvement with compensatory stepping strategies.  Baseline: 16/28 Goal status: INITIAL  4. Pt will improve 5 x STS to less than or equal to 13 seconds without UE support and absent retropulsion to demonstrate improved functional strength and transfer efficiency.  Baseline: 16.15s w/retropulsion; 19.82s without UE support, retropulsion on first rep on 8/2  Goal status: NEW   ASSESSMENT:  CLINICAL  IMPRESSION: Today's skilled session continued to focus on strengthening and balance training with emphasis on large movements and visual scanning. No issues noted or reported in session. The pt is progressing toward goals and should benefit from continued PT to progress toward unmet goals.   OBJECTIVE IMPAIRMENTS decreased activity tolerance, decreased balance, decreased cognition, decreased coordination, decreased knowledge of condition, decreased knowledge of use of DME, decreased mobility, difficulty walking, decreased strength, decreased safety awareness, impaired perceived functional ability, impaired vision/preception, and improper body mechanics.    ACTIVITY LIMITATIONS carrying, lifting, bending, sitting, standing, squatting, stairs, transfers, and locomotion level   PARTICIPATION LIMITATIONS: meal prep, cleaning, laundry, medication management, driving, shopping, community activity, and yard work   PERSONAL FACTORS Behavior pattern, Fitness, Transportation, and 1 comorbidity: lack of support in home environment  are also affecting patient's functional outcome.    REHAB POTENTIAL: Good   CLINICAL DECISION MAKING: Evolving/moderate complexity   EVALUATION COMPLEXITY: Moderate  PLAN: PT FREQUENCY: 2x/week  PT DURATION: 8 weeks  PLANNED INTERVENTIONS: Therapeutic exercises, Therapeutic activity, Neuromuscular re-education, Balance training, Gait training, Patient/Family education, Stair  training, DME instructions, Manual therapy, and Re-evaluation  PLAN FOR NEXT SESSION:   Pt has 2 PT appt's left on schedule with goals due 12/03/21. Begin to check LTGs.    Willow Ora, PTA, [facility] [address], [place], [place] [address] [phone] 11/18/21, 1:26 PM

## 2021-11-18 NOTE — Therapy (Signed)
OUTPATIENT SPEECH LANGUAGE PATHOLOGY TREATMENT NOTE (PROGRESS NOTE)   Patient Name: Kimberlyann Vilikia Watkins [place] MRN: [id number] DOB:[birth year], 71 y.o., female Today's Date: 11/18/2021  PCP: Glendale Chard, MD REFERRING PROVIDER: [name], MD   END OF SESSION:   End of Session - 11/18/21 1151     Visit Number 11    Number of Visits 17    Date for SLP Re-Evaluation 12/04/21    Authorization Type [employer]    Authorization Time Period $20 co pay per day    SLP Start Time 1145    SLP Stop Time  1230    SLP Time Calculation (min) 45 min    Activity Tolerance Patient tolerated treatment well              Speech Therapy Progress Note  Dates of Reporting Period: 10-08-21 to current  Objective Reports of Subjective Statement: Pt has been seen for 10 ST visits targeting cognition, communication, and swallowing, with good carryover of SLP recommendations exhibited to improve functional independence and safety.   Objective Measurements: Pt exhibits improved awareness of functional deficits, ability to ID appropriate strategies to modify tasks/behaviors for increased independence and safety, and improved swallow function with use of recommended compensations.   Goal Update: see below  Plan: continue per POC  Reason Skilled Services are Required: Pt would continue to benefit from skilled ST intervention to optimize safety and functionality.      Past Medical History:  Diagnosis Date   Diabetes mellitus without complication ([place])    Hypertension    Substance abuse ([place])    Past Surgical History:  Procedure Laterality Date   EYE SURGERY     Patient Active Problem List   Diagnosis Date Noted   Decreased dorsalis pedis pulse 11/17/2020   Tachycardia 08/11/2020   Cough 08/11/2020   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   Pure hypercholesterolemia 01/23/2020   Vitamin D deficiency 10/23/2019   Right hip pain 05/15/2018   Estrogen deficiency 05/15/2018   Class 1  obesity due to excess calories with serious comorbidity and body mass index (BMI) of 31.0 to 31.9 in adult 05/15/2018   Diabetes mellitus with stage 2 chronic kidney disease ([place]) 01/11/2018   Benign hypertension with chronic kidney disease, stage II 01/11/2018    ONSET DATE: PD dx 2021; 08/25/2021 (referral date)  REFERRING DIAG: R47.1ICD-10-CM Dysarthria and anarthria  R41.841ICD-10-CM Cognitive communication deficit  R13.10ICD-10-CM Dysphagia, unspecified type   THERAPY DIAG:  Cognitive communication deficit  Dysarthria and anarthria  Rationale for Evaluation and Treatment Rehabilitation  SUBJECTIVE: "I worked last night"  PAIN:  Are you having pain? No   OBJECTIVE:  TODAY'S TREATMENT:   11-18-21: Targeted word finding and compensations for anomia using SFA chart initially to generate salient descriptions of objects with rare min questioning cues and extended time for processing. Removed chart and practiced generating salient descriptions of variety of photos/categories with extended time and usual min to mod questioning cues [name] generated accurate descriptions (meaning I ID'd her object) 7/7x. Provided HW for word finding and use of verbal compensations for anomia. She continues to be impulsive sitting and walking. When questioned she verbalized rules for safe sitting, standing and walking but admits she goes too fast and forgets these at home. Targeted safety awareness and problem solving IDing safety issues in household situations (photos) with extended time and occasional min questioning cues, Briannie ID the safety issue and generated a safe decision 10/10x.    11-11-21: Good carryover of SLP recommendations reported at  home. While discussing recent modifications to shower (added shower chair and new shower head), pt exhibited intermittent anomia and dysnomia impacting message cohesion and listener comprehension. SLP educated and instructed word retrieval strategies to aid word  finding. Targeted SFA and use of description strategy, in which pt able to demonstrate with intermittent mild prompting for specific information and more thorough descriptions. Provided HEP to target word retrieval.   11-05-21: Pt reportedly self-identified "flopping down" while transferring into chairs. Generated strategy of verbal sequencing aloud to aid attention and accuracy, which was effective in demonstration today given occasional mod A to implement and recall. Pt able to appropriately sequence steps for cooking and transfers with usual prompting for next step. Sister reported recent safety concern with pillows on ground, which pt was attempting to step on/over. Pt needed assistance to comprehend why scenario was unsafe but able to recall and explain today. Targeted any additional safety concerns around house, in which pt able to ID additional problems with usual prompting from sister.   11-03-21: Pt continues with excellent carryover of SLP recommendations and compensations to assist with cognitive linguistic functioning and swallow functioning. Sister provided positive feedback of patient carryover of trained techniques. No overt s/sx of aspiration reported with PO intake with implementation of SLP recommendations. Discussed possible challenges related to work, in which pt endorsed feeling "scattered" sometimes related to becoming distracted. Pt able to ID need to eliminate distractions by closing her office door. SLP also recommended only working for length of attention span (pt approximated 15-30 minutes) and taking a cognitive breaks as needed. Targeted alternating attention task for typing work related information, in which pt able to ID typing errors with good accuracy and timeliness. Occasional min A required to ID 1/2 possible errors with provided typing material. Pt denied any recent anomia/dysnomia.   10-29-21: Pt completed HEP with success. Educated and instructed recommended attention and  memory compensations to aid auditory processing, accuracy of task completion, and functional recall. Pt benefits from focusing on one task at time, repetition of information, and slower speech. Discussed upcoming return to work, in which pt able to ID possible challenges with occasional prompting.   10-27-21: Pt reported fatigue after working at [employer] job last night. Denied any cognitive or communicative challenges for part-time work directing guests to seats. Success with time management and establishment of new routines reported by patient and sister. Continues to deny any recent word finding episodes or difficulties. Some reduced recall exhibited in relation to family assisting with organizing her home (sister prompted recall of plan to put dog pad on porch and put mail in stand up versus drawer). SLP educated and instructed memory compensations to aid recall, such as repetition and writing down information to aid patient comprehension and carryover of new procedures. Pt exhibited adequate delayed recall of new procedures with mild prompting and cues. Discussed additional ways to organize house to aid attention and recall of personal items.   10-22-21: Recalled prior therapy activities and able to ID strategies to implement (visual scanning). Completed HEP to ID solution to optimize time management, in which pt identified need to wake up earlier. Successful implementation of solution and improved time management reported this morning. Time management worksheet completed independently was 50% accurate, which improved to 95% accurate with SLP re-reading question aloud. Educated patient on attention, recall, and comprehension strategies, such as monotasking, using external recall aids, re-reading to ensure comprehension, and double checking for error awareness. No overt word retrieval difficulty reported recently. SLP assisted  patient with self-recognition of variations in volume (too loud versus too soft).    10-20-21: Reported she lost hwk from last session. Targeted functional time management tasks today, in which pt completed with rare min A and benefited from cued double check. Discussed targeted cognitive communication strategies from last session, in which pt able to recall with mod I. Some increased awareness exhibited, in which pt used retrospective analysis to determine event x1 in which "stop and think" would have benefited her. Pt alerted sister of self-advocacy plan if patient is not understanding or needs repetition. Targeted functional time calculations for her morning routine to aid problem solving and attention for prepared arrival of transportation. Pt able to identify she needed more time to realistically complete her morning routine. Requested pt ID solutions for next ST session.    PATIENT EDUCATION: Education details: see above Person educated: Patient Education method: Customer service manager Education comprehension: verbalized understanding, returned demonstration, and needs further education     GOALS: Goals reviewed with patient? Yes   SHORT TERM GOALS: Target date: 11/05/2021    Pt will complete clinical swallow assessment in first 1-2 ST sessions and pt will complete MBSS as needed for additional assessment per SLP recommendation  Baseline: Goal status: Met (consider future MBSS referral pending implementation of swallow precautions)   2.  Pt will utilize dysarthria compensations to optimize vocal intensity and clarity in short structured conversations given occasional mod A over 2 sessions Baseline: 10-29-21, 11-03-21 Goal status: Met   3.  Pt will utilize word retrieval strategies to aid anomia/dysnomia on structured speech tasks given occasional mod A over 2 sessions Baseline:  Goal status: Deferred (not reported over last 4 sessions)   4.  Pt will implement attention/processing strategies in short structured conversations to aid patient engagement given  occasional mod A over 2 sessions  Baseline: 10-22-21, 10-29-21 Goal status: Met   5.  Pt will implement memory compensations to optimize performance at work and home given occasional mod A over 2 sessions Baseline: 10-22-21, 10-29-21 Goal status: Met   6.  Family will utilize techniques to aid patient processing speed and comprehension given occasional min A over 2 sessions  Baseline: 10-22-21, 10-27-21 Goal status: Met    LONG TERM GOALS: Target date: 12/04/2021     Pt will implement attention/processing strategies in 10-15 minute conversations to aid patient engagement given occasional min A over 2 sessions  Baseline: 11-11-21 Goal status: ongoing   2.  Pt will demonstrate ability to ID and correct/compensate for anomia/dysnomia in 10-15 minute conversations given occasional min A over 2 sessions Baseline: 11-05-21 Goal status: ongoing   3.  Pt will implement memory compensations to optimize performance at work and home given occasional min A over 2 sessions Baseline:  Goal status: ongoing   4.  Family will utilize techniques to aid patient processing speed and comprehension given rare min A over 2 sessions  Baseline:  Goal status: ongoing   5.  Pt will consume safest and least restrictive diet with reduced s/sx of aspiration with use of trained techniques as needed over 2 sessions Baseline: 11-11-21 Goal status: ongoing   ASSESSMENT:   CLINICAL IMPRESSION: Patient is a 71 y.o. 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 Targeted cognitive communication strategies/tasks to implement at home and work to aid cognitive functioning (processing, recall, attention) with support from family requested to aid carryover. Good carryover of recommended SLP techniques and strategies reported thus far. Pt would continue to benefit  from skilled ST intervention to optimize functional independence and safety.    OBJECTIVE IMPAIRMENTS  Objective impairments include attention, memory,  executive functioning, expressive language, dysarthria, and dysphagia. These impairments are limiting patient from household responsibilities, ADLs/IADLs, effectively communicating at home and in community, and safety when swallowing.Factors affecting potential to achieve goals and functional outcome are ability to learn/carryover information, cooperation/participation level, medical prognosis, and previous level of function. Patient will benefit from skilled SLP services to address above impairments and improve overall function.   REHAB POTENTIAL: Fair - hx of reduced carryover of HEP   PLAN: SLP FREQUENCY: 2x/week   SLP DURATION: 8 weeks   PLANNED INTERVENTIONS: Language facilitation, Environmental controls, Cueing hierachy, Cognitive reorganization, Internal/external aids, Functional tasks, Multimodal communication approach, SLP instruction and feedback, Compensatory strategies, and Patient/family education     Iline Oven, CCC-SLP 11/18/2021, 12:39 PM

## 2021-11-18 NOTE — Therapy (Signed)
OUTPATIENT OCCUPATIONAL THERAPY PARKINSON'S Treatment  Patient Name: April Larson MRN: [id number] DOB:11-Oct-1950, 71 y.o., female Today's Date: 11/18/2021  PCP: Glendale Chard, MD REFERRING PROVIDER: [name], MD    OT End of Session - 11/18/21 1104     Visit Number 11    Date for OT Re-Evaluation 12/03/21    Authorization Type Aetna MCR    Authorization - Visit Number 11    Progress Note Due on Visit 40    OT Start Time 1104    OT Stop Time 1145    OT Time Calculation (min) 41 min                     Past Medical History:  Diagnosis Date   Diabetes mellitus without complication ([place])    Hypertension    Substance abuse ([place])    Past Surgical History:  Procedure Laterality Date   EYE SURGERY     Patient Active Problem List   Diagnosis Date Noted   Decreased dorsalis pedis pulse 11/17/2020   Tachycardia 08/11/2020   Cough 08/11/2020   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   Pure hypercholesterolemia 01/23/2020   Vitamin D deficiency 10/23/2019   Right hip pain 05/15/2018   Estrogen deficiency 05/15/2018   Class 1 obesity due to excess calories with serious comorbidity and body mass index (BMI) of 31.0 to 31.9 in adult 05/15/2018   Diabetes mellitus with stage 2 chronic kidney disease ([place]) 01/11/2018   Benign hypertension with chronic kidney disease, stage II 01/11/2018    ONSET DATE: 08/25/21 (referral date)   REFERRING DIAG: G20ICD-10-CM Primary parkinsonism ([place])   THERAPY DIAG:  Unsteadiness on feet  Muscle weakness (generalized)  Other abnormalities of gait and mobility  Other symptoms and signs involving the nervous system  Other lack of coordination  Attention and concentration deficit  Stiffness of right elbow, not elsewhere classified  Rationale for Evaluation and Treatment Rehabilitation  SUBJECTIVE:   SUBJECTIVE STATEMENT: Pt reports she is doing well  Pt accompanied by sister  PERTINENT HISTORY:  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 No  PATIENT GOALS walking and balance, handwriting  OBJECTIVE: Pt demo inability to track inferiorly, limited ability/difficulty to track superiorly, difficulty tracking to the L and significant limitation tracking to the R.  Pt appears to demo decr peripheral vision on both sides R>L.     TODAY'S TREATMENT:  PWR! moves basic 4 in seated 10 reps each, min v.c to watch her hand, and for amplitude Closed chain shoulder flexion with paper towel roll, min v.c for pt to watch her hand while performing  Education provided regarding: Memory/ cognitive compensations, community resources, ways to prevent future complications Education regarding sit-stand and stand to sit and side stepping safely, pt returned demonstration with min v.c    PATIENT EDUCATION:    PATIENT EDUCATION: Education details: PWR! moves basic 4 in seated 10 reps each, min v.c to watch her hand, and for amplitude,  Memory/ cognitive compensations, community resources, ways to prevent future complications Person educated: Patient Education method: Explanation, Demonstration, Verbal cues, and Handouts Education comprehension: verbalized understanding, returned demonstration, and verbal cues required       HOME EXERCISE PROGRAM:  Vision HEP 10/27/21-Closed chain shoulder flexion with paper towel roll 10/29/21-coordination HEP 11/11/21-PWR! moves basic 4 in seated  11/18/21-Memory/ cognitive compensations, community resources, ways to prevent future complications   GOALS: Goals reviewed  with patient? Yes  SHORT TERM GOALS: Target date: 11/05/2021   Independent w/ modified PD HEP (? Bag ex's, focus on ROM, trunk) Baseline: Goal status: MET  2.  Pt to verbalize understanding w/ safety and fall prevention techniques including DME for showering Baseline:  Goal status: MET  3.  Pt to improve  standing functional reach test to 10" or greater Rt side Baseline: 9" (Lt = 12")  Goal status: 10.5 RUE  4.  Pt to verbalize understanding with handwriting strategies and demo minimal micrographia for 3 sentences Baseline:  Goal status: MET Pt demonstrates ability to perform  5.  Pt will verbalize understanding of adaptive strategies to increase ease/efficiency and safety w/ ADLS/IADLS Baseline:  Goal status:ongoing  6.  Pt to improve Rt hand coordination as evidenced by performing 9 hole peg test in 35 sec or less Baseline: Rt = 40 sec (Lt = 28 sec)  Goal status: ongoing, improved but not met 36.72  LONG TERM GOALS: Target date: 12/03/2021    Pt will verbalize understanding of ways to prevent future related complications and appropriate community resources prn Baseline:  Goal status: ongoing   2.  Pt will verbalize understanding of cognitive compensations Baseline:  Goal status: INITIAL  3.  Pt will demo visual compensations and safely navigate environment w/ 80% accuracy in environmental scanning Baseline:  Goal status: INITIAL  4.  Pt will improve button/unbutton test to 50 sec or less Baseline: 56 sec Goal status: MET 42.82  5.  Pt demo full reaching RUE w/ elbow ext to -20* or less Baseline: -30* Goal status:    ASSESSMENT:  CLINICAL IMPRESSION:    PERFORMANCE DEFICITS in functional skills including ADLs, IADLs, coordination, dexterity, proprioception, ROM, FMC, mobility, balance, body mechanics, endurance, decreased knowledge of precautions, decreased knowledge of use of DME, vision, and UE functional use, cognitive skills including energy/drive, memory, perception, problem solving, safety awareness, and understand, and psychosocial skills including coping strategies.   IMPAIRMENTS are limiting patient from ADLs, IADLs, work, and social participation.   COMORBIDITIES may have co-morbidities  that affects occupational performance. Patient will benefit from skilled  OT to address above impairments and improve overall function.  MODIFICATION OR ASSISTANCE TO COMPLETE EVALUATION: No modification of tasks or assist necessary to complete an evaluation.  OT OCCUPATIONAL PROFILE AND HISTORY: Detailed assessment: Review of records and additional review of physical, cognitive, psychosocial history related to current functional performance.  CLINICAL DECISION MAKING: Moderate - several treatment options, min-mod task modification necessary  REHAB POTENTIAL: Fair impaired vision and cognition  EVALUATION COMPLEXITY: Moderate  PLAN: OT FREQUENCY: 2x/week  OT DURATION: 8 weeks, plus eval  PLANNED INTERVENTIONS: self care/ADL training, therapeutic exercise, therapeutic activity, neuromuscular re-education, passive range of motion, balance training, functional mobility training, patient/family education, cognitive remediation/compensation, visual/perceptual remediation/compensation, coping strategies training, DME and/or AE instructions, and Re-evaluation  RECOMMENDED OTHER SERVICES: ? Driving eval, resources for Assisted living  CONSULTED AND AGREED WITH PLAN OF CARE: Patient  PLAN FOR NEXT SESSION:  community resources, cognitive compensations, schedule screens in 6 mons   Coti Burd, OTR/L 11/18/2021, 11:05 AM  Theone Murdoch, OTR/L [phone] Phone: [phone] 11:05 AM 11/18/21

## 2021-11-19 ENCOUNTER — Ambulatory Visit: Payer: Self-pay

## 2021-11-19 NOTE — Patient Outreach (Signed)
  Care Coordination   Follow Up Visit Note   11/19/2021 Name: April Larson MRN: [id number] DOB: 1951/02/14  April Larson is a 71 y.o. year old female who sees Glendale Chard, MD for primary care. I spoke with  April Larson by phone today  What matters to the patients health and wellness today?  Patient would like to continue to improve her current status by working with PT/OT and ST.     Goals Addressed      I want to keep working with [employer] to help me improve and get stronger       Care Coordination Interventions: Evaluation of current treatment plan related to Atypical Parkinsonism, self-management and patient's adherence to plan as established by provider Determined patient continues to work with Neuro PT/ST/OT with good effectiveness Educated patient about signs/symptoms of aspirational pneumonia and discussed risk factors Instructed patient to contact Dr. Baird Cancer promptly if symptoms occur and or if she is having increased choking and or strangling episodes Mailed printed educational materials related to deep breathing exercises for patient to utilize with stretching exercises    SDOH assessments and interventions completed:  Yes    Care Coordination Interventions Activated:  Yes  Care Coordination Interventions:  Yes, provided   Follow up plan: Follow up call scheduled for 01/19/22 '@09'$ :30 AM     Encounter Outcome:  Pt. Visit Completed

## 2021-11-19 NOTE — Patient Instructions (Signed)
Visit Information  Thank you for taking time to visit with me today. Please don't hesitate to contact me if I can be of assistance to you.   Following are the goals we discussed today:   Goals Addressed      I want to keep working with [employer] to help me improve and get stronger       Care Coordination Interventions: Evaluation of current treatment plan related to Atypical Parkinsonism, self-management and patient's adherence to plan as established by provider Determined patient continues to work with Neuro PT/ST/OT with good effectiveness Educated patient about signs/symptoms of aspirational pneumonia and discussed risk factors Instructed patient to contact Dr. Baird Cancer promptly if symptoms occur and or if she is having increased choking and or strangling episodes Mailed printed educational materials related to deep breathing exercises for patient to utilize with stretching exercises    Our next appointment is by telephone on 01/19/22 at 09:30 AM   Please call the care guide team at [phone] if you need to cancel or reschedule your appointment.   If you are experiencing a Mental Health or [place] or need someone to talk to, please call [phone]-TALK (toll free, 24 hour hotline)  Patient verbalizes understanding of instructions and care plan provided today and agrees to view in [place]. Active MyChart status and patient understanding of how to access instructions and care plan via MyChart confirmed with patient.     Barb Merino, RN, BSN, CCM Care Management Coordinator [facility] Care Management  Direct Phone: [phone]

## 2021-11-19 NOTE — Progress Notes (Signed)
This encounter was created in error - please disregard.

## 2021-11-20 ENCOUNTER — Ambulatory Visit: Payer: Medicare HMO | Admitting: Occupational Therapy

## 2021-11-20 ENCOUNTER — Ambulatory Visit: Payer: Medicare HMO | Admitting: Physical Therapy

## 2021-11-20 DIAGNOSIS — R1310 Dysphagia, unspecified: Secondary | ICD-10-CM | POA: Diagnosis not present

## 2021-11-20 DIAGNOSIS — R41841 Cognitive communication deficit: Secondary | ICD-10-CM | POA: Diagnosis not present

## 2021-11-20 DIAGNOSIS — R278 Other lack of coordination: Secondary | ICD-10-CM

## 2021-11-20 DIAGNOSIS — R2681 Unsteadiness on feet: Secondary | ICD-10-CM

## 2021-11-20 DIAGNOSIS — R29818 Other symptoms and signs involving the nervous system: Secondary | ICD-10-CM

## 2021-11-20 DIAGNOSIS — R293 Abnormal posture: Secondary | ICD-10-CM | POA: Diagnosis not present

## 2021-11-20 DIAGNOSIS — R41842 Visuospatial deficit: Secondary | ICD-10-CM | POA: Diagnosis not present

## 2021-11-20 DIAGNOSIS — M25621 Stiffness of right elbow, not elsewhere classified: Secondary | ICD-10-CM

## 2021-11-20 DIAGNOSIS — R2689 Other abnormalities of gait and mobility: Secondary | ICD-10-CM

## 2021-11-20 DIAGNOSIS — R41840 Attention and concentration deficit: Secondary | ICD-10-CM | POA: Diagnosis not present

## 2021-11-20 DIAGNOSIS — M6281 Muscle weakness (generalized): Secondary | ICD-10-CM

## 2021-11-20 DIAGNOSIS — R471 Dysarthria and anarthria: Secondary | ICD-10-CM | POA: Diagnosis not present

## 2021-11-20 NOTE — Therapy (Signed)
OUTPATIENT PHYSICAL THERAPY NEURO TREATMENT   Patient Name: April Larson MRN: [id number] DOB:11/03/1950, 71 y.o., female Today's Date: 11/20/2021    PCP: Glendale Chard, MD REFERRING PROVIDER: [name], MD    PT End of Session - 11/20/21 1232     Visit Number 12    Number of Visits 17   Plus eval   Date for PT Re-Evaluation 12/10/21    Authorization Type [employer]    Progress Note Due on Visit 83    PT Start Time 1231    PT Stop Time 1312    PT Time Calculation (min) 41 min    Activity Tolerance Patient tolerated treatment well    Behavior During Therapy WFL for tasks assessed/performed                 Past Medical History:  Diagnosis Date   Diabetes mellitus without complication ([place])    Hypertension    Substance abuse ([place])    Past Surgical History:  Procedure Laterality Date   EYE SURGERY     Patient Active Problem List   Diagnosis Date Noted   Decreased dorsalis pedis pulse 11/17/2020   Tachycardia 08/11/2020   Cough 08/11/2020   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   Pure hypercholesterolemia 01/23/2020   Vitamin D deficiency 10/23/2019   Right hip pain 05/15/2018   Estrogen deficiency 05/15/2018   Class 1 obesity due to excess calories with serious comorbidity and body mass index (BMI) of 31.0 to 31.9 in adult 05/15/2018   Diabetes mellitus with stage 2 chronic kidney disease ([place]) 01/11/2018   Benign hypertension with chronic kidney disease, stage II 01/11/2018    ONSET DATE: 08/25/2021 (referral)  REFERRING DIAG: R47.1 (ICD-10-CM) - Dysarthria and anarthria R41.841 (ICD-10-CM) - Cognitive communication deficit R13.10 (ICD-10-CM) - Dysphagia, unspecified type R27.8 (ICD-10-CM) - Other lack of coordination R26.81 (ICD-10-CM) - Unsteadiness on feet R29.3 (ICD-10-CM) - Abnormal posture G20 (ICD-10-CM) - Primary parkinsonism ([place])   THERAPY DIAG:  Unsteadiness on feet  Other abnormalities of gait and mobility  Other  lack of coordination  Rationale for Evaluation and Treatment Rehabilitation  PERTINENT HISTORY: HTN, DM, high chol, CKD, Supranuclear Palsy  PRECAUTIONS: Fall  PATIENT GOALS "I wanna work on walking"   OBJECTIVE:   COGNITION: Overall cognitive status: No family/caregiver present to determine baseline cognitive functioning   SUBJECTIVE: Pt reports she is doing well, exercises are still challenging, especially sit <>stands. No new falls                                        PAIN:  Are you having pain? No    TODAY'S TREATMENT:   NMR  -Reviewed proper sit <>stand technique using NDT principles w/emphasis on anterior weight shift and retropulsion correction. Pt performed x5 reps and required concurrent verbal cues to remember to shift weight forward, scoot to edge of mat and not use hands.  - Progressed to adding rockerboard in A/P direction under patient to assess recall from previous PT session. Pt unable to perform initially without UE support and demonstrated significant retropulsion despite max multimodal cues (verbal, visual and tactile). Regressed cueing and performed part-practice of anterior weight shift (rocking), lifting buttocks off mat and reaching forward and finally standing up on board. W/part-practice, pt able to perform without UE support. Added PWR Up at top of rep for improved postural control. Noted significant difficulty w/eccentric control of  stand <>sit despite max cues and UE support. Pt performed x20 reps w/intermittent verbal cues to remind pt of proper sequence. CGA throughout for safety.   -OH slam balls using 10# ball to various colored dots on ground for high amplitude movement, anterior weight shift and visual scanning. Noted pt demonstrated min freezing walking to various dots while holding ball. Min cues for high amplitude slam throughout, as pt wanting to simply drop ball. Also cues provided for proper form to pick ball up from ground to prevent straining  back. Pt performed 2x8 reps, requiring lengthy rest break in between sets due to fatigue. "Girl you are killing me". Noted pt able to locate dots to R side of body > L side and required cues to move head to locate proper dots. CGA throughout for safety  RPE of 7/10 following session   GAIT: Gait pattern: step through pattern, decreased stride length, decreased trunk rotation, and narrow BOS Distance walked: around clinic with session Assistive device utilized: None Level of assistance: SBA Comments: cues to scan environment/move head    PATIENT EDUCATION: Education details: Continue with current HEP, plan to DC next session  Person educated: Patient Education method: Customer service manager Education comprehension: verbalized understanding    HOME EXERCISE PROGRAM: Access Code: [email or web address], Standing PWR moves.      GOALS: Goals reviewed with patient? Yes  SHORT TERM GOALS: Target date: 11/05/2021  Pt will be independent with initial HEP for improved strength, balance, transfers and gait. Baseline: Has HEP from previous bout of therapy, needs to be reviewed; pt reports she performs every day  Goal status: MET  2.  Pt will verbalize and demonstrate fall prevention strategies in the home and community for reduced fall risk and frequency.  Baseline: Pt able to verbalize reduction of throw rugs, having well-lit areas and using rails on stairs  Goal status: MET  3.  Pt will improve cog TUG to less than or equal to 11 seconds for improved functional mobility and decreased fall risk. Baseline: 13.25s; 15.55s on 8/3 Goal status: NOT MET  4.  Pt will improve 5 x STS to less than or equal to 13 seconds without UE support and absent retropulsion to demonstrate improved functional strength and transfer efficiency.  Baseline: 16.15s w/retropulsion; 19.82s without UE support, retropulsion on first rep on 8/2 Goal status: NOT MET  LONG TERM GOALS: Target date: 12/03/2021  Pt will be  independent with final HEP for improved strength, balance, transfers and gait. Baseline:  Goal status: INITIAL  2.  Pt will verbalize understanding of local PD community resources, including fitness post DC.  Baseline:  Goal status: INITIAL  3.  Pt will improve MiniBest to 21/28 for decreased fall risk and improvement with compensatory stepping strategies.  Baseline: 16/28 Goal status: INITIAL  4. Pt will improve 5 x STS to less than or equal to 13 seconds without UE support and absent retropulsion to demonstrate improved functional strength and transfer efficiency.  Baseline: 16.15s w/retropulsion; 19.82s without UE support, retropulsion on first rep on 8/2  Goal status: NEW   ASSESSMENT:  CLINICAL IMPRESSION: Emphasis of skilled PT session on high amplitude movements, anterior weight shifting and PWR up posture. Pt continues to demonstrate retropulsion w/sit <>stands despite repetition and cues. However, pt now able to identify when she performs movement incorrectly. Pt exhibited poor visual scanning due to lack of cervical rotation and poor attention to L side. Pt in agreement to DC from PT next session. Continue POC.  OBJECTIVE IMPAIRMENTS decreased activity tolerance, decreased balance, decreased cognition, decreased coordination, decreased knowledge of condition, decreased knowledge of use of DME, decreased mobility, difficulty walking, decreased strength, decreased safety awareness, impaired perceived functional ability, impaired vision/preception, and improper body mechanics.    ACTIVITY LIMITATIONS carrying, lifting, bending, sitting, standing, squatting, stairs, transfers, and locomotion level   PARTICIPATION LIMITATIONS: meal prep, cleaning, laundry, medication management, driving, shopping, community activity, and yard work   PERSONAL FACTORS Behavior pattern, Fitness, Transportation, and 1 comorbidity: lack of support in home environment  are also affecting patient's  functional outcome.    REHAB POTENTIAL: Good   CLINICAL DECISION MAKING: Evolving/moderate complexity   EVALUATION COMPLEXITY: Moderate  PLAN: PT FREQUENCY: 2x/week  PT DURATION: 8 weeks  PLANNED INTERVENTIONS: Therapeutic exercises, Therapeutic activity, Neuromuscular re-education, Balance training, Gait training, Patient/Family education, Stair training, DME instructions, Manual therapy, and Re-evaluation  PLAN FOR NEXT SESSION:   LTG assessment and DC   Cruzita Lederer Bryton Waight, PT, DPT [facility] [address] [place], [place]  [address] Phone:  [phone] Fax:  [phone] 11/20/21, 1:14 PM

## 2021-11-20 NOTE — Therapy (Signed)
OUTPATIENT OCCUPATIONAL THERAPY P[facility]IN[facility]'S Treatment  Patient Name: April Larson MRN: [id number] DOB:[birth year], 71 y.o., female Today's Date: 11/20/2021  PCP: Glendale Chard, MD REFERRING PROVIDER: [name], MD    OT End of Session - 11/20/21 1348     Visit Number 12    Date for OT Re-Evaluation 12/03/21    Authorization Type [employer] MCR    Authorization - Visit Number 12    Progress Note Due on Visit 20    OT Start Time 1150    OT Stop Time 1230    OT Time Calculation (min) 40 min                      Past Medical History:  Diagnosis Date   Diabetes mellitus without complication ([place])    Hypertension    Substance abuse ([place])    Past Surgical History:  Procedure Laterality Date   EYE SURGERY     Patient Active Problem List   Diagnosis Date Noted   Decreased dorsalis pedis pulse 11/17/2020   Tachycardia 08/11/2020   Cough 08/11/2020   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   Pure hypercholesterolemia 01/23/2020   Vitamin D deficiency 10/23/2019   Right hip pain 05/15/2018   Estrogen deficiency 05/15/2018   Class 1 obesity due to excess calories with serious comorbidity and body mass index (BMI) of 31.0 to 31.9 in adult 05/15/2018   Diabetes mellitus with stage 2 chronic kidney disease ([place]) 01/11/2018   Benign hypertension with chronic kidney disease, stage II 01/11/2018    ONSET DATE: 08/25/21 (referral date)   REFERRING DIAG: G20ICD-10-CM Primary parkinsonism ([place])   THERAPY DIAG:  Unsteadiness on feet  Muscle weakness (generalized)  Other abnormalities of gait and mobility  Other symptoms and signs involving the nervous system  Other lack of coordination  Attention and concentration deficit  Stiffness of right elbow, not elsewhere classified  Abnormal posture  Visuospatial deficit  Rationale for Evaluation and Treatment Rehabilitation  SUBJECTIVE:   SUBJECTIVE STATEMENT: Pt reports she is doing well  Pt  accompanied by sister  PERTINENT HISTORY: 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 No  PATIENT GOALS walking and balance, handwriting  OBJECTIVE: Pt demo inability to track inferiorly, limited ability/difficulty to track superiorly, difficulty tracking to the L and significant limitation tracking to the R.  Pt appears to demo decr peripheral vision on both sides R>L.     TODAY'S TREATMENT:  PWR! moves basic 4 in supine 10 reps each, min v.c to watch her hand, and for amplitude Closed chain shoulder flexion with paper towel roll, min v.c for pt to watch her hand while performing  Environmental scanning 5/15 missed first pass (66 %)however pt located all remaining items on second pass, and she verbalizes understanding of scanning strategies. Checked 9 hole peg test, pt met STG 6  PATIENT EDUCATION:    PATIENT EDUCATION: Education details: PWR! moves basic 4 in supine10 reps each, min v.c to watch her hand, and for amplitude,  Memory/ cognitive compensations, community resources, ways to prevent future complications Person educated: Patient Education method: Explanation, Demonstration, Verbal cues, and Handouts Education comprehension: verbalized understanding, returned demonstration, and verbal cues required       HOME EXERCISE PROGRAM:  Vision HEP 10/27/21-Closed chain shoulder flexion with paper towel roll 10/29/21-coordination HEP 11/11/21-PWR! moves basic 4 in seated  11/18/21-Memory/ cognitive compensations, community resources, ways to prevent future  complications   GOALS: Goals reviewed with patient? Yes  SHORT TERM GOALS: Target date: 11/05/2021   Independent w/ modified PD HEP (? Bag ex's, focus on ROM, trunk) Baseline: Goal status: MET  2.  Pt to verbalize understanding w/ safety and fall prevention techniques including DME for showering Baseline:  Goal status: MET  3.  Pt to  improve standing functional reach test to 10" or greater Rt side Baseline: 9" (Lt = 12")  Goal status: 10.5 RUE  4.  Pt to verbalize understanding with handwriting strategies and demo minimal micrographia for 3 sentences Baseline:  Goal status: MET Pt demonstrates ability to perform  5.  Pt will verbalize understanding of adaptive strategies to increase ease/efficiency and safety w/ ADLS/IADLS Baseline:  Goal status:ongoing  6.  Pt to improve Rt hand coordination as evidenced by performing 9 hole peg test in 35 sec or less Baseline: Rt = 40 sec (Lt = 28 sec)  Goal status: ongoing, MET 34.41 secs  LONG TERM GOALS: Target date: 12/03/2021    Pt will verbalize understanding of ways to prevent future related complications and appropriate community resources prn Baseline:  Goal status: MET  2.  Pt will verbalize understanding of cognitive compensations Baseline:  Goal status:MET  3.  Pt will demo visual compensations and safely navigate environment w/ 80% accuracy in environmental scanning Baseline:  Goal status: ongoing 5/15 missed 11/20/21  4.  Pt will improve button/unbutton test to 50 sec or less Baseline: 56 sec Goal status: MET 42.82  5.  Pt demo full reaching RUE w/ elbow ext to -20* or less Baseline: -30* Goal status:    ASSESSMENT:  CLINICAL IMPRESSION: Pt demonstrates good overall progress. She agrees with plans for d/c next visit.   PERFORMANCE DEFICITS in functional skills including ADLs, IADLs, coordination, dexterity, proprioception, ROM, FMC, mobility, balance, body mechanics, endurance, decreased knowledge of precautions, decreased knowledge of use of DME, vision, and UE functional use, cognitive skills including energy/drive, memory, perception, problem solving, safety awareness, and understand, and psychosocial skills including coping strategies.   IMPAIRMENTS are limiting patient from ADLs, IADLs, work, and social participation.   COMORBIDITIES may have  co-morbidities  that affects occupational performance. Patient will benefit from skilled OT to address above impairments and improve overall function.  MODIFICATION OR ASSISTANCE TO COMPLETE EVALUATION: No modification of tasks or assist necessary to complete an evaluation.  OT OCCUPATIONAL PROFILE AND HISTORY: Detailed assessment: Review of records and additional review of physical, cognitive, psychosocial history related to current functional performance.  CLINICAL DECISION MAKING: Moderate - several treatment options, min-mod task modification necessary  REHAB POTENTIAL: Fair impaired vision and cognition  EVALUATION COMPLEXITY: Moderate  PLAN: OT FREQUENCY: 2x/week  OT DURATION: 8 weeks, plus eval  PLANNED INTERVENTIONS: self care/ADL training, therapeutic exercise, therapeutic activity, neuromuscular re-education, passive range of motion, balance training, functional mobility training, patient/family education, cognitive remediation/compensation, visual/perceptual remediation/compensation, coping strategies training, DME and/or AE instructions, and Re-evaluation  RECOMMENDED OTHER SERVICES: ? Driving eval, resources for Assisted living  CONSULTED AND AGREED WITH PLAN OF CARE: Patient  PLAN FOR NEXT SESSION:  d/c next visit, reinforce scanning   Anissia Wessells, OTR/L 11/20/2021, 1:49 PM  Theone Murdoch, OTR/L [phone] Phone: [phone] 1:49 PM 11/20/21

## 2021-11-25 ENCOUNTER — Ambulatory Visit: Payer: Medicare HMO | Admitting: Physical Therapy

## 2021-11-25 ENCOUNTER — Encounter: Payer: Self-pay | Admitting: Speech Pathology

## 2021-11-25 ENCOUNTER — Ambulatory Visit: Payer: Medicare HMO | Admitting: Occupational Therapy

## 2021-11-25 ENCOUNTER — Ambulatory Visit: Payer: Medicare HMO | Admitting: Speech Pathology

## 2021-11-25 DIAGNOSIS — R41840 Attention and concentration deficit: Secondary | ICD-10-CM

## 2021-11-25 DIAGNOSIS — R29818 Other symptoms and signs involving the nervous system: Secondary | ICD-10-CM

## 2021-11-25 DIAGNOSIS — R41842 Visuospatial deficit: Secondary | ICD-10-CM

## 2021-11-25 DIAGNOSIS — M6281 Muscle weakness (generalized): Secondary | ICD-10-CM | POA: Diagnosis not present

## 2021-11-25 DIAGNOSIS — R1310 Dysphagia, unspecified: Secondary | ICD-10-CM | POA: Diagnosis not present

## 2021-11-25 DIAGNOSIS — R2681 Unsteadiness on feet: Secondary | ICD-10-CM | POA: Diagnosis not present

## 2021-11-25 DIAGNOSIS — R471 Dysarthria and anarthria: Secondary | ICD-10-CM

## 2021-11-25 DIAGNOSIS — R2689 Other abnormalities of gait and mobility: Secondary | ICD-10-CM

## 2021-11-25 DIAGNOSIS — R278 Other lack of coordination: Secondary | ICD-10-CM

## 2021-11-25 DIAGNOSIS — R41841 Cognitive communication deficit: Secondary | ICD-10-CM

## 2021-11-25 DIAGNOSIS — M25621 Stiffness of right elbow, not elsewhere classified: Secondary | ICD-10-CM

## 2021-11-25 DIAGNOSIS — R293 Abnormal posture: Secondary | ICD-10-CM

## 2021-11-25 NOTE — Patient Instructions (Signed)
   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

## 2021-11-25 NOTE — Therapy (Signed)
OUTPATIENT SPEECH LANGUAGE PATHOLOGY TREATMENT NOTE (PROGRESS NOTE)   Patient Name: April Larson MRN: [id number] DOB:03-23-1951, 71 y.o., female Today's Date: 11/25/2021  PCP: Glendale Chard, MD REFERRING PROVIDER: [name], MD   END OF SESSION:   End of Session - 11/25/21 1150     Visit Number 12    Number of Visits 17    Date for SLP Re-Evaluation 12/04/21    Authorization Type [employer] Medicare    Authorization Time Period $20 co pay per day    SLP Start Time 1148    SLP Stop Time  1230    SLP Time Calculation (min) 42 min    Activity Tolerance Patient tolerated treatment well              Past Medical History:  Diagnosis Date   Diabetes mellitus without complication ([place])    Hypertension    Substance abuse ([place])    Past Surgical History:  Procedure Laterality Date   EYE SURGERY     Patient Active Problem List   Diagnosis Date Noted   Decreased dorsalis pedis pulse 11/17/2020   Tachycardia 08/11/2020   Cough 08/11/2020   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   Pure hypercholesterolemia 01/23/2020   Vitamin D deficiency 10/23/2019   Right hip pain 05/15/2018   Estrogen deficiency 05/15/2018   Class 1 obesity due to excess calories with serious comorbidity and body mass index (BMI) of 31.0 to 31.9 in adult 05/15/2018   Diabetes mellitus with stage 2 chronic kidney disease ([place]) 01/11/2018   Benign hypertension with chronic kidney disease, stage II 01/11/2018    ONSET DATE: PD dx 2021; 08/25/2021 (referral date)  REFERRING DIAG: R47.1ICD-10-CM Dysarthria and anarthria  R41.841ICD-10-CM Cognitive communication deficit  R13.10ICD-10-CM Dysphagia, unspecified type   THERAPY DIAG:  Cognitive communication deficit  Dysarthria and anarthria  Rationale for Evaluation and Treatment Rehabilitation  SUBJECTIVE: "I worked last night"  PAIN:  Are you having pain? No   OBJECTIVE:  TODAY'S TREATMENT:   11-25-21: [name] returns to work on  Monday. She verbalized 2 strategies to implement for slow processing while at work. She ID'd support of her sister who works with her. She is using note taking to compensate for memory, however she feels her memory is improving. She demonstrates compensations for word finding in structured complex naming task 3x with min questioning cues. In conversation re: current events, she used compensation 1/1x with min questioning cues. [name] recalled 5 republican candidates with mod I and 2 democratic candidates and details re: issues and current events with mod I.   11-18-21: Targeted word finding and compensations for anomia using SFA chart initially to generate salient descriptions of objects with rare min questioning cues and extended time for processing. Removed chart and practiced generating salient descriptions of variety of photos/categories with extended time and usual min to mod questioning cues [name] generated accurate descriptions (meaning I ID'd her object) 7/7x. Provided HW for word finding and use of verbal compensations for anomia. She continues to be impulsive sitting and walking. When questioned she verbalized rules for safe sitting, standing and walking but admits she goes too fast and forgets these at home. Targeted safety awareness and problem solving IDing safety issues in household situations (photos) with extended time and occasional min questioning cues, Tuesday ID the safety issue and generated a safe decision 10/10x.    11-11-21: Good carryover of SLP recommendations reported at home. While discussing recent modifications to shower (added shower chair and new shower head), pt  exhibited intermittent anomia and dysnomia impacting message cohesion and listener comprehension. SLP educated and instructed word retrieval strategies to aid word finding. Targeted SFA and use of description strategy, in which pt able to demonstrate with intermittent mild prompting for specific information and more  thorough descriptions. Provided HEP to target word retrieval.   11-05-21: Pt reportedly self-identified "flopping down" while transferring into chairs. Generated strategy of verbal sequencing aloud to aid attention and accuracy, which was effective in demonstration today given occasional mod A to implement and recall. Pt able to appropriately sequence steps for cooking and transfers with usual prompting for next step. Sister reported recent safety concern with pillows on ground, which pt was attempting to step on/over. Pt needed assistance to comprehend why scenario was unsafe but able to recall and explain today. Targeted any additional safety concerns around house, in which pt able to ID additional problems with usual prompting from sister.   11-03-21: Pt continues with excellent carryover of SLP recommendations and compensations to assist with cognitive linguistic functioning and swallow functioning. Sister provided positive feedback of patient carryover of trained techniques. No overt s/sx of aspiration reported with PO intake with implementation of SLP recommendations. Discussed possible challenges related to work, in which pt endorsed feeling "scattered" sometimes related to becoming distracted. Pt able to ID need to eliminate distractions by closing her office door. SLP also recommended only working for length of attention span (pt approximated 15-30 minutes) and taking a cognitive breaks as needed. Targeted alternating attention task for typing work related information, in which pt able to ID typing errors with good accuracy and timeliness. Occasional min A required to ID 1/2 possible errors with provided typing material. Pt denied any recent anomia/dysnomia.   10-29-21: Pt completed HEP with success. Educated and instructed recommended attention and memory compensations to aid auditory processing, accuracy of task completion, and functional recall. Pt benefits from focusing on one task at time, repetition  of information, and slower speech. Discussed upcoming return to work, in which pt able to ID possible challenges with occasional prompting.   10-27-21: Pt reported fatigue after working at [employer] job last night. Denied any cognitive or communicative challenges for part-time work directing guests to seats. Success with time management and establishment of new routines reported by patient and sister. Continues to deny any recent word finding episodes or difficulties. Some reduced recall exhibited in relation to family assisting with organizing her home (sister prompted recall of plan to put dog pad on porch and put mail in stand up versus drawer). SLP educated and instructed memory compensations to aid recall, such as repetition and writing down information to aid patient comprehension and carryover of new procedures. Pt exhibited adequate delayed recall of new procedures with mild prompting and cues. Discussed additional ways to organize house to aid attention and recall of personal items.   10-22-21: Recalled prior therapy activities and able to ID strategies to implement (visual scanning). Completed HEP to ID solution to optimize time management, in which pt identified need to wake up earlier. Successful implementation of solution and improved time management reported this morning. Time management worksheet completed independently was 50% accurate, which improved to 95% accurate with SLP re-reading question aloud. Educated patient on attention, recall, and comprehension strategies, such as monotasking, using external recall aids, re-reading to ensure comprehension, and double checking for error awareness. No overt word retrieval difficulty reported recently. SLP assisted patient with self-recognition of variations in volume (too loud versus too soft).   10-20-21:  Reported she lost hwk from last session. Targeted functional time management tasks today, in which pt completed with rare min A and benefited from  cued double check. Discussed targeted cognitive communication strategies from last session, in which pt able to recall with mod I. Some increased awareness exhibited, in which pt used retrospective analysis to determine event x1 in which "stop and think" would have benefited her. Pt alerted sister of self-advocacy plan if patient is not understanding or needs repetition. Targeted functional time calculations for her morning routine to aid problem solving and attention for prepared arrival of transportation. Pt able to identify she needed more time to realistically complete her morning routine. Requested pt ID solutions for next ST session.    PATIENT EDUCATION: Education details: see above Person educated: Patient Education method: Dance movement psychotherapist comprehension: verbalized understanding, returned demonstration, and needs further education    SPEECH THERAPY DISCHARGE SUMMARY  Visits from Start of Care: 12  Current functional level related to goals / functional outcomes: See goals below   Remaining deficits: Mild dysnomia   Education / Equipment: Compensations for dysnomia, attention, memory, processing   Patient agrees to discharge. Patient goals were met. Patient is being discharged due to meeting the stated rehab goals.Marland Kitchen    GOALS: Goals reviewed with patient? Yes   SHORT TERM GOALS: Target date: 11/05/2021    Pt will complete clinical swallow assessment in first 1-2 ST sessions and pt will complete MBSS as needed for additional assessment per SLP recommendation  Baseline: Goal status: Met (consider future MBSS referral pending implementation of swallow precautions)   2.  Pt will utilize dysarthria compensations to optimize vocal intensity and clarity in short structured conversations given occasional mod A over 2 sessions Baseline: 10-29-21, 11-03-21 Goal status: Met   3.  Pt will utilize word retrieval strategies to aid anomia/dysnomia on structured speech  tasks given occasional mod A over 2 sessions Baseline:  Goal status: Deferred (not reported over last 4 sessions)   4.  Pt will implement attention/processing strategies in short structured conversations to aid patient engagement given occasional mod A over 2 sessions  Baseline: 10-22-21, 10-29-21 Goal status: Met   5.  Pt will implement memory compensations to optimize performance at work and home given occasional mod A over 2 sessions Baseline: 10-22-21, 10-29-21 Goal status: Met   6.  Family will utilize techniques to aid patient processing speed and comprehension given occasional min A over 2 sessions  Baseline: 10-22-21, 10-27-21 Goal status: Met    LONG TERM GOALS: Target date: 12/04/2021     Pt will implement attention/processing strategies in 10-15 minute conversations to aid patient engagement given occasional min A over 2 sessions  Baseline: 11-11-21 Goal status: MET   2.  Pt will demonstrate ability to ID and correct/compensate for anomia/dysnomia in 10-15 minute conversations given occasional min A over 2 sessions Baseline: 11-05-21 Goal status: MET   3.  Pt will implement memory compensations to optimize performance at work and home given occasional min A over 2 sessions Baseline:  Goal status: MET   4.  Family will utilize techniques to aid patient processing speed and comprehension given rare min A over 2 sessions  Baseline:  Goal status: MET   5.  Pt will consume safest and least restrictive diet with reduced s/sx of aspiration with use of trained techniques as needed over 2 sessions Baseline: 11-11-21 Goal status: MET ASSESSMENT:   CLINICAL IMPRESSION: Patient is a 72 y.o. 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 Targeted cognitive communication strategies/tasks to implement at home and work to aid cognitive functioning (processing, recall, attention) with support from family requested to aid carryover. Good carryover of recommended SLP techniques and  strategies reported thus far. Goals met, pt. Returns to part time work next week. D/c at this time, pt in agreement. She will return for screen in March 2024.    OBJECTIVE IMPAIRMENTS  Objective impairments include attention, memory, executive functioning, expressive language, dysarthria, and dysphagia. These impairments are limiting patient from household responsibilities, ADLs/IADLs, effectively communicating at home and in community, and safety when swallowing.Factors affecting potential to achieve goals and functional outcome are ability to learn/carryover information, cooperation/participation level, medical prognosis, and previous level of function. Patient will benefit from skilled SLP services to address above impairments and improve overall function.   REHAB POTENTIAL: Fair - hx of reduced carryover of HEP   PLAN: SLP FREQUENCY: 2x/week   SLP DURATION: 8 weeks   PLANNED INTERVENTIONS: Language facilitation, Environmental controls, Cueing hierachy, Cognitive reorganization, Internal/external aids, Functional tasks, Multimodal communication approach, SLP instruction and feedback, Compensatory strategies, and Patient/family education     Iline Oven, [place] 11/25/2021, 12:35 PM

## 2021-11-25 NOTE — Progress Notes (Signed)
[facility] PREP Weekly Session  Patient Details  Name: April Larson MRN: [id number] Date of Birth: 07-31-1950 Age: 71 y.o. PCP: Glendale Chard, MD  Vitals:   11/24/21 1800  Weight: 163 lb (73.9 kg)     [facility] Weekly seesion - 11/25/21 0900       [facility] "PREP" Location   [facility]      Weekly Session   Topic Discussed Expectations and non-scale victories    Minutes exercised this week 40 minutes    Classes attended to date 12             Barnett Hatter 11/25/2021, 9:56 AM

## 2021-11-25 NOTE — Therapy (Signed)
OUTPATIENT PHYSICAL THERAPY NEURO TREATMENT- DISCHARGE SUMMARY   Patient Name: April Larson MRN: [id number] DOB:Dec 30, 1950, 71 y.o., female Today's Date: 11/25/2021    PCP: Glendale Chard, MD REFERRING PROVIDER: [name], MD   PHYSICAL THERAPY DISCHARGE SUMMARY  Visits from Start of Care: 13  Current functional level related to goals / functional outcomes: See below    Remaining deficits: Gait impairments 2/2 PD, decreased amplitude of movement, cognitive deficits, Decreased peripheral vision and visual scanning    Education / Equipment: PD specific HEP,  PD community resources sheet  Patient agrees to discharge. Patient goals were met. Patient is being discharged due to the patient's request due to going back to work.    PT End of Session - 11/25/21 1105     Visit Number 13    Number of Visits 17   Plus eval   Date for PT Re-Evaluation 12/10/21    Authorization Type Aetna Medicare    Progress Note Due on Visit 22    PT Start Time 1103   Handoff w/OT   PT Stop Time 1139   DC   PT Time Calculation (min) 36 min    Activity Tolerance Patient tolerated treatment well    Behavior During Therapy WFL for tasks assessed/performed                 Past Medical History:  Diagnosis Date   Diabetes mellitus without complication ([place])    Hypertension    Substance abuse ([place])    Past Surgical History:  Procedure Laterality Date   EYE SURGERY     Patient Active Problem List   Diagnosis Date Noted   Decreased dorsalis pedis pulse 11/17/2020   Tachycardia 08/11/2020   Cough 08/11/2020   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   Pure hypercholesterolemia 01/23/2020   Vitamin D deficiency 10/23/2019   Right hip pain 05/15/2018   Estrogen deficiency 05/15/2018   Class 1 obesity due to excess calories with serious comorbidity and body mass index (BMI) of 31.0 to 31.9 in adult 05/15/2018   Diabetes mellitus with stage 2 chronic kidney disease ([place])  01/11/2018   Benign hypertension with chronic kidney disease, stage II 01/11/2018    ONSET DATE: 08/25/2021 (referral)  REFERRING DIAG: R47.1 (ICD-10-CM) - Dysarthria and anarthria R41.841 (ICD-10-CM) - Cognitive communication deficit R13.10 (ICD-10-CM) - Dysphagia, unspecified type R27.8 (ICD-10-CM) - Other lack of coordination R26.81 (ICD-10-CM) - Unsteadiness on feet R29.3 (ICD-10-CM) - Abnormal posture G20 (ICD-10-CM) - Primary parkinsonism ([place])   THERAPY DIAG:  Unsteadiness on feet  Other abnormalities of gait and mobility  Other lack of coordination  Rationale for Evaluation and Treatment Rehabilitation  PERTINENT HISTORY: HTN, DM, high chol, CKD, Supranuclear Palsy  PRECAUTIONS: Fall  PATIENT GOALS "I wanna work on walking"   OBJECTIVE:   COGNITION: Overall cognitive status: No family/caregiver present to determine baseline cognitive functioning   SUBJECTIVE: Pt reports she is doing well, excited to DC today. No new changes to report                                        PAIN:  Are you having pain? No    TODAY'S TREATMENT:   Therapeutic Activity   LTG assessment   [facility] PT Assessment - 11/25/21 1111       Mini-BESTest   Sit To Stand Normal: Comes to stand without use of hands and stabilizes independently.  Rise to Toes Normal: Stable for 3 s with maximum height.    Stand on one leg (left) Moderate: < 20 s   8.25s   Stand on one leg (right) Moderate: < 20 s   9.03s   Stand on one leg - lowest score 1    Compensatory Stepping Correction - Forward Moderate: More than one step is required to recover equilibrium   2 small steps   Compensatory Stepping Correction - Backward Moderate: More than one step is required to recover equilibrium   3 small steps   Compensatory Stepping Correction - Left Lateral Moderate: Several steps to recover equilibrium   Crossover stepping   Compensatory Stepping Correction - Right Lateral Moderate: Several steps to recover  equilibrium   Crossover stepping   Stepping Corredtion Lateral - lowest score 1    Stance - Feet together, eyes open, firm surface  Normal: 30s    Stance - Feet together, eyes closed, foam surface  Normal: 30s    Incline - Eyes Closed Normal: Stands independently 30s and aligns with gravity    Change in Gait Speed Normal: Significantly changes walkling speed without imbalance    Walk with head turns - Horizontal Moderate: performs head turns with reduction in gait speed.   Minor trip w/R head turn   Walk with pivot turns Normal: Turns with feet close FAST (< 3 steps) with good balance.    Step over obstacles Moderate: Steps over box but touches box OR displays cautious behavior by slowing gait.    Timed UP & GO with Dual Task Moderate: Dual Task affects either counting OR walking (>10%) when compared to the TUG without Dual Task.    Mini-BEST total score 21      Timed Up and Go Test   Normal TUG (seconds) 11.56    Cognitive TUG (seconds) 14.09   retro by 3s starting at 57             GAIT: Gait pattern: step through pattern, decreased stride length, decreased trunk rotation, and narrow BOS Distance walked: around clinic with session Assistive device utilized: None Level of assistance: Modified independence Comments: cues to scan environment/move head    PATIENT EDUCATION: Education details: Goal outcomes, continuing HEP for max carryover at home and functional mobility potential. Plan for 19mo PD screen  Person educated: Patient Education method: Customer service manager Education comprehension: verbalized understanding    HOME EXERCISE PROGRAM: Access Code: [email or web address], Standing PWR moves.      GOALS: Goals reviewed with patient? Yes  SHORT TERM GOALS: Target date: 11/05/2021  Pt will be independent with initial HEP for improved strength, balance, transfers and gait. Baseline: Has HEP from previous bout of therapy, needs to be reviewed; pt reports she performs  every day  Goal status: MET  2.  Pt will verbalize and demonstrate fall prevention strategies in the home and community for reduced fall risk and frequency.  Baseline: Pt able to verbalize reduction of throw rugs, having well-lit areas and using rails on stairs  Goal status: MET  3.  Pt will improve cog TUG to less than or equal to 11 seconds for improved functional mobility and decreased fall risk. Baseline: 13.25s; 15.55s on 8/3 Goal status: NOT MET  4.  Pt will improve 5 x STS to less than or equal to 13 seconds without UE support and absent retropulsion to demonstrate improved functional strength and transfer efficiency.  Baseline: 16.15s w/retropulsion; 19.82s without UE support, retropulsion on first rep  on 8/2 Goal status: NOT MET  LONG TERM GOALS: Target date: 12/03/2021  Pt will be independent with final HEP for improved strength, balance, transfers and gait. Baseline:  Goal status: MET  2.  Pt will verbalize understanding of local PD community resources, including fitness post DC.  Baseline:  Goal status: MET  3.  Pt will improve MiniBest to 21/28 for decreased fall risk and improvement with compensatory stepping strategies.  Baseline: 16/28; 21/28 on 8/23 Goal status: MET  4. Pt will improve 5 x STS to less than or equal to 13 seconds without UE support and absent retropulsion to demonstrate improved functional strength and transfer efficiency.  Baseline: 16.15s w/retropulsion; 19.82s without UE support, retropulsion on first rep on 8/2; 18.47s without UE support and no retropulsion  Goal status: PARTIALLY MET   ASSESSMENT:  CLINICAL IMPRESSION: Emphasis of skilled PT session on LTG assessment and DC from PT. PT has met 3/4 and partially met 1/4 LTGs, improving her minibest to 21/28 and performing her HEP consistently. Pt partially met her 5x STS goal, performing without UE support and no retropulsion but slower than set goal. Pt verbalized agreement to DC from PT today  and verbalized understanding of community PD resources.   OBJECTIVE IMPAIRMENTS decreased activity tolerance, decreased balance, decreased cognition, decreased coordination, decreased knowledge of condition, decreased knowledge of use of DME, decreased mobility, difficulty walking, decreased strength, decreased safety awareness, impaired perceived functional ability, impaired vision/preception, and improper body mechanics.    ACTIVITY LIMITATIONS carrying, lifting, bending, sitting, standing, squatting, stairs, transfers, and locomotion level   PARTICIPATION LIMITATIONS: meal prep, cleaning, laundry, medication management, driving, shopping, community activity, and yard work   PERSONAL FACTORS Behavior pattern, Fitness, Transportation, and 1 comorbidity: lack of support in home environment  are also affecting patient's functional outcome.    REHAB POTENTIAL: Good   CLINICAL DECISION MAKING: Evolving/moderate complexity   EVALUATION COMPLEXITY: Moderate  PLAN: PT FREQUENCY: 2x/week  PT DURATION: 8 weeks  PLANNED INTERVENTIONS: Therapeutic exercises, Therapeutic activity, Neuromuscular re-education, Balance training, Gait training, Patient/Family education, Stair training, DME instructions, Manual therapy, and Re-evaluation     Charlett Nose, PT, DPT [place] [address] [place], [place]  [address] Phone:  [phone] Fax:  [phone] 11/25/21, 11:43 AM

## 2021-11-25 NOTE — Therapy (Signed)
OUTPATIENT OCCUPATIONAL THERAPY [facility] Treatment  Patient Name: April Larson MRN: [id number] DOB:January 23, 1951, 71 y.o., female Today's Date: 11/25/2021  PCP: Glendale Chard, MD REFERRING PROVIDER: [name], MD  OCCUPATIONAL THERAPY DISCHARGE SUMMARY   Current functional level related to goals / functional outcomes: Pt made excellent progress towards goals. She achieved all short and long term goals.   Remaining deficits: Bradykinesia, visual deficits, cognitive deficits, decreased balance,decreased coordination   Education / Equipment: Pt was educated regarding: HEP, compensation for visual deficits, adapted strategies for ADLS. Pt demonstrates understanding of all education.   Patient agrees to discharge. Patient goals were met. Patient is being discharged due to meeting the stated rehab goals..      OT End of Session - 11/25/21 1112     Visit Number 13    Number of Visits 17    Date for OT Re-Evaluation 12/03/21    Authorization Type [employer] MCR    Authorization - Visit Number 12    Progress Note Due on Visit 4    OT Start Time 1021    OT Stop Time 1100    OT Time Calculation (min) 39 min    Activity Tolerance Patient tolerated treatment well    Behavior During Therapy WFL for tasks assessed/performed                       Past Medical History:  Diagnosis Date   Diabetes mellitus without complication ([place])    Hypertension    Substance abuse ([place])    Past Surgical History:  Procedure Laterality Date   EYE SURGERY     Patient Active Problem List   Diagnosis Date Noted   Decreased dorsalis pedis pulse 11/17/2020   Tachycardia 08/11/2020   Cough 08/11/2020   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   Pure hypercholesterolemia 01/23/2020   Vitamin D deficiency 10/23/2019   Right hip pain 05/15/2018   Estrogen deficiency 05/15/2018   Class 1 obesity due to excess calories with serious comorbidity and body mass index (BMI) of  31.0 to 31.9 in adult 05/15/2018   Diabetes mellitus with stage 2 chronic kidney disease ([place]) 01/11/2018   Benign hypertension with chronic kidney disease, stage II 01/11/2018    ONSET DATE: 08/25/21 (referral date)   REFERRING DIAG: G20ICD-10-CM Primary parkinsonism ([place])   THERAPY DIAG:  Other lack of coordination  Muscle weakness (generalized)  Other symptoms and signs involving the nervous system  Attention and concentration deficit  Stiffness of right elbow, not elsewhere classified  Abnormal posture  Visuospatial deficit  Rationale for Evaluation and Treatment Rehabilitation  SUBJECTIVE:   SUBJECTIVE STATEMENT: Pt reports she is doing well  Pt accompanied by sister  PERTINENT HISTORY: 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 No  PATIENT GOALS walking and balance, handwriting  OBJECTIVE: Pt demo inability to track inferiorly, limited ability/difficulty to track superiorly, difficulty tracking to the L and significant limitation tracking to the R.  Pt appears to demo decr peripheral vision on both sides R>L.     TODAY'S TREATMENT:  PWR! Hands, 10 reps each, min v.c Checked progress towards goals.  Arm bike x 5 mins level 1 for conditioning Environmental scanning 13/16 located on first pass, pt required min vc. To locate remaining- 81% accuracy. Copying small peg design with RUE for increased fine motor coordination, visual skills and cognition min difficulty/ v.c for coordination  PATIENT EDUCATION:  PATIENT EDUCATION: Education details: closed chain shoulder flexion and chest press, min v.c to watch her hands and for amplitude,  Memory/ cognitive compensations, community resources, ways to prevent future complications Person educated: Patient Education method: Explanation, Demonstration, Verbal cues, and Handouts Education comprehension: verbalized understanding,  returned demonstration, and verbal cues required       HOME EXERCISE PROGRAM:  Vision HEP 10/27/21-Closed chain shoulder flexion with paper towel roll 10/29/21-coordination HEP 11/11/21-PWR! moves basic 4 in seated  11/18/21-Memory/ cognitive compensations, community resources, ways to prevent future complications   GOALS: Goals reviewed with patient? Yes  SHORT TERM GOALS: Target date: 11/05/2021   Independent w/ modified PD HEP (? Bag ex's, focus on ROM, trunk) Baseline: Goal status: MET  2.  Pt to verbalize understanding w/ safety and fall prevention techniques including DME for showering Baseline:  Goal status: MET  3.  Pt to improve standing functional reach test to 10" or greater Rt side Baseline: 9" (Lt = 12")  Goal status: 10.5 RUE MET  4.  Pt to verbalize understanding with handwriting strategies and demo minimal micrographia for 3 sentences Baseline:  Goal status: MET Pt demonstrates ability to perform  5.  Pt will verbalize understanding of adaptive strategies to increase ease/efficiency and safety w/ ADLS/IADLS Baseline:  Goal status:MET  6.  Pt to improve Rt hand coordination as evidenced by performing 9 hole peg test in 35 sec or less Baseline: Rt = 40 sec (Lt = 28 sec)  Goal status: , MET 34.41 secs  LONG TERM GOALS: Target date: 12/03/2021    Pt will verbalize understanding of ways to prevent future related complications and appropriate community resources prn Baseline:  Goal status: MET  2.  Pt will verbalize understanding of cognitive compensations Baseline:  Goal status:MET  3.  Pt will demo visual compensations and safely navigate environment w/ 80% accuracy in environmental scanning Baseline:  Goal status: MET, 81 %  4.  Pt will improve button/unbutton test to 50 sec or less Baseline: 56 sec Goal status: MET 42.82  5.  Pt demo full reaching RUE w/ elbow ext to -20* or less Baseline: -30 Goal status: -20 for RUE  MET   ASSESSMENT:  CLINICAL IMPRESSION: Pt demonstrates good overall progress. She agrees with plans for d/c    PERFORMANCE DEFICITS in functional skills including ADLs, IADLs, coordination, dexterity, proprioception, ROM, FMC, mobility, balance, body mechanics, endurance, decreased knowledge of precautions, decreased knowledge of use of DME, vision, and UE functional use, cognitive skills including energy/drive, memory, perception, problem solving, safety awareness, and understand, and psychosocial skills including coping strategies.   IMPAIRMENTS are limiting patient from ADLs, IADLs, work, and social participation.   COMORBIDITIES may have co-morbidities  that affects occupational performance. Patient will benefit from skilled OT to address above impairments and improve overall function.  MODIFICATION OR ASSISTANCE TO COMPLETE EVALUATION: No modification of tasks or assist necessary to complete an evaluation.  OT OCCUPATIONAL PROFILE AND HISTORY: Detailed assessment: Review of records and additional review of physical, cognitive, psychosocial history related to current functional performance.  CLINICAL DECISION MAKING: Moderate - several treatment options, min-mod task modification necessary  REHAB POTENTIAL: Fair impaired vision and cognition  EVALUATION COMPLEXITY: Moderate  PLAN: OT FREQUENCY: 2x/week  OT DURATION: 8 weeks, plus eval  PLANNED INTERVENTIONS: self care/ADL training, therapeutic exercise, therapeutic activity, neuromuscular re-education, passive range of motion, balance training, functional mobility training, patient/family education, cognitive remediation/compensation, visual/perceptual remediation/compensation, coping strategies training, DME and/or AE instructions, and Re-evaluation  RECOMMENDED  OTHER SERVICES: ? Driving eval, resources for Assisted living  CONSULTED AND AGREED WITH PLAN OF CARE: Patient  PLAN FOR NEXT SESSION:  d/c OT   Sevilla Murtagh,  OTR/L 11/25/2021, 11:16 AM  Theone Murdoch, OTR/L [phone] Phone: [phone] 11:16 AM 11/25/21

## 2021-12-10 ENCOUNTER — Ambulatory Visit (HOSPITAL_COMMUNITY)
Admission: RE | Admit: 2021-12-10 | Discharge: 2021-12-10 | Disposition: A | Discharge status: Home / Self Care | Payer: Medicare (Managed Care) | Source: Ambulatory Visit | Attending: Internal Medicine | Admitting: Internal Medicine

## 2021-12-10 DIAGNOSIS — R0989 Other specified symptoms and signs involving the circulatory and respiratory systems: Secondary | ICD-10-CM | POA: Insufficient documentation

## 2021-12-10 NOTE — Progress Notes (Signed)
[facility] PREP Weekly Session  Patient Details  Name: April Larson MRN: [id number] Date of Birth: Apr 24, 1950 Age: 71 y.o. PCP: Glendale Chard, MD  Vitals:   12/09/21 1619  Weight: 163 lb (73.9 kg)     [facility] Weekly seesion - 12/10/21 1600       [facility] "PREP" Location   [facility] "PREP" Location [facility]      Weekly Session   Topic Discussed Finding support    Minutes exercised this week 30 minutes    Classes attended to date 82             [place] 12/10/2021, 4:19 PM

## 2021-12-15 ENCOUNTER — Telehealth: Payer: Self-pay

## 2021-12-15 NOTE — Chronic Care Management (AMB) (Signed)
Novo Nordisk patient assistance program notification:  120- day supply of Xultophy 100/3.6 mg will be filled on 01/02/2022 and should arrive to the office in 10-14 business days.    [name], [place] Pharmacist Assistant [phone]'

## 2021-12-17 DIAGNOSIS — H26492 Other secondary cataract, left eye: Secondary | ICD-10-CM | POA: Diagnosis not present

## 2021-12-17 DIAGNOSIS — G231 Progressive supranuclear ophthalmoplegia [Steele-Richardson-Olszewski]: Secondary | ICD-10-CM | POA: Diagnosis not present

## 2021-12-17 DIAGNOSIS — Z961 Presence of intraocular lens: Secondary | ICD-10-CM | POA: Diagnosis not present

## 2021-12-17 DIAGNOSIS — Z794 Long term (current) use of insulin: Secondary | ICD-10-CM | POA: Diagnosis not present

## 2021-12-17 DIAGNOSIS — H1045 Other chronic allergic conjunctivitis: Secondary | ICD-10-CM | POA: Diagnosis not present

## 2021-12-17 DIAGNOSIS — H5000 Unspecified esotropia: Secondary | ICD-10-CM | POA: Diagnosis not present

## 2021-12-17 DIAGNOSIS — E119 Type 2 diabetes mellitus without complications: Secondary | ICD-10-CM | POA: Diagnosis not present

## 2021-12-17 DIAGNOSIS — H04123 Dry eye syndrome of bilateral lacrimal glands: Secondary | ICD-10-CM | POA: Diagnosis not present

## 2021-12-22 ENCOUNTER — Ambulatory Visit (INDEPENDENT_AMBULATORY_CARE_PROVIDER_SITE_OTHER): Payer: Medicare (Managed Care)

## 2021-12-22 VITALS — BP 130/70 | HR 98 | Temp 98.4°F

## 2021-12-22 DIAGNOSIS — Z23 Encounter for immunization: Secondary | ICD-10-CM

## 2021-12-22 NOTE — Patient Instructions (Signed)
Influenza Virus Vaccine injection What is this medication? INFLUENZA VIRUS VACCINE (in floo EN zuh VAHY ruhs vak SEEN) helps to reduce the risk of getting influenza also known as the flu. The vaccine only helps protect you against some strains of the flu. This medicine may be used for other purposes; ask your health care provider or pharmacist if you have questions. COMMON BRAND NAME(S): Afluria, Afluria Quadrivalent, Agriflu, Alfuria, FLUAD, FLUAD Quadrivalent, Fluarix, Fluarix Quadrivalent, Flublok, Flublok Quadrivalent, FLUCELVAX, FLUCELVAX Quadrivalent, Flulaval, Flulaval Quadrivalent, Fluvirin, Fluzone, Fluzone High-Dose, Fluzone Intradermal, Fluzone Quadrivalent What should I tell my care team before I take this medication? They need to know if you have any of these conditions: bleeding disorder like hemophilia fever or infection Guillain-Barre syndrome or other neurological problems immune system problems infection with the human immunodeficiency virus (HIV) or AIDS low blood platelet counts multiple sclerosis an unusual or allergic reaction to influenza virus vaccine, latex, other medicines, foods, dyes, or preservatives. Different brands of vaccines contain different allergens. Some may contain latex or eggs. Talk to your doctor about your allergies to make sure that you get the right vaccine. pregnant or trying to get pregnant breast-feeding How should I use this medication? This vaccine is for injection into a muscle or under the skin. It is given by a health care professional. A copy of Vaccine Information Statements will be given before each vaccination. Read this sheet carefully each time. The sheet may change frequently. Talk to your healthcare provider to see which vaccines are right for you. Some vaccines should not be used in all age groups. Overdosage: If you think you have taken too much of this medicine contact a poison control center or emergency room at once. NOTE: This  medicine is only for you. Do not share this medicine with others. What if I miss a dose? This does not apply. What may interact with this medication? chemotherapy or radiation therapy medicines that lower your immune system like etanercept, anakinra, infliximab, and adalimumab medicines that treat or prevent blood clots like warfarin phenytoin steroid medicines like prednisone or cortisone theophylline vaccines This list may not describe all possible interactions. Give your health care provider a list of all the medicines, herbs, non-prescription drugs, or dietary supplements you use. Also tell them if you smoke, drink alcohol, or use illegal drugs. Some items may interact with your medicine. What should I watch for while using this medication? Report any side effects that do not go away within 3 days to your doctor or health care professional. Call your health care provider if any unusual symptoms occur within 6 weeks of receiving this vaccine. You may still catch the flu, but the illness is not usually as bad. You cannot get the flu from the vaccine. The vaccine will not protect against colds or other illnesses that may cause fever. The vaccine is needed every year. What side effects may I notice from receiving this medication? Side effects that you should report to your doctor or health care professional as soon as possible: allergic reactions like skin rash, itching or hives, swelling of the face, lips, or tongue Side effects that usually do not require medical attention (report to your doctor or health care professional if they continue or are bothersome): fever headache muscle aches and pains pain, tenderness, redness, or swelling at the injection site tiredness This list may not describe all possible side effects. Call your doctor for medical advice about side effects. You may report side effects to FDA at 1-800-FDA-1088.   Where should I keep my medication? The vaccine will be given  by a health care professional in a clinic, pharmacy, doctor's office, or other health care setting. You will not be given vaccine doses to store at home. NOTE: This sheet is a summary. It may not cover all possible information. If you have questions about this medicine, talk to your doctor, pharmacist, or health care provider.  2023 Elsevier/Gold Standard (2020-10-24 00:00:00)  

## 2021-12-22 NOTE — Progress Notes (Signed)
Patient is present for flu shot.

## 2021-12-23 ENCOUNTER — Other Ambulatory Visit: Payer: Self-pay | Admitting: Internal Medicine

## 2021-12-23 DIAGNOSIS — Z1231 Encounter for screening mammogram for malignant neoplasm of breast: Secondary | ICD-10-CM

## 2021-12-28 ENCOUNTER — Other Ambulatory Visit: Payer: Self-pay

## 2021-12-28 ENCOUNTER — Telehealth: Payer: Self-pay

## 2021-12-28 DIAGNOSIS — R490 Dysphonia: Secondary | ICD-10-CM

## 2021-12-28 MED ORDER — OMEPRAZOLE 40 MG PO CPDR: DELAYED_RELEASE_CAPSULE | ORAL | 2 refills | Status: DC

## 2021-12-28 MED ORDER — FLUTICASONE PROPIONATE 50 MCG/ACT NA SUSP: NASAL | 2 refills | Status: DC

## 2021-12-28 NOTE — Chronic Care Management (AMB) (Signed)
Chronic Care Management Pharmacy Assistant   Name: April Larson  MRN: [id number] DOB: [birth year]  Reason for Encounter: Medication Review/ medication coordination   Recent office visits:  12-22-2021 Octavio Manns. Flu vaccine given  11-19-2021 Little, Claudette Stapler, RN (CCM)  11-17-2021 Glendale Chard, MD. Alkaline Phosphatase= 135. A1C= 6.3. Abnormal UA. VAS [place] ABI WITH/WO TBI ordered. EKG completed.  10-28-2021 Daneen Schick (CCM)  10-12-2021 Little, Claudette Stapler, RN (CCM)  Recent consult visits:  12-10-2021 Germain Osgood, RN. PREP class  11-25-2021 Lovvorn, Hewitt Shorts, CCC-SLP (Speech pathology). SLP treatment.  11-25-2021 Plaster, Cruzita Lederer, PT (Physical therapy). PT treatment  11-25-2021 Hulda Marin, OT (Occupational therapy). OT treatment.  11-24-2021 Germain Osgood, RN. PREP class  11-20-2021 Plaster, Cruzita Lederer, PT (Physical therapy). PT treatment  11-20-2021 Hulda Marin, OT (Occupational therapy). OT treatment.  11-18-2021 Lovvorn, Hewitt Shorts, CCC-SLP (Speech pathology). SLP treatment.  11-18-2021 Hulda Marin, OT (Occupational therapy). OT treatment.  11-18-2021 Drema Balzarine, PTA  (Physical therapy). PT treatment.  11-11-2021 Hulda Marin, OT (Occupational therapy). OT treatment.  11-11-2021 Marzetta Board, CCC-SLP (Speech pathology). SLP treatment.  11-11-2021 Arliss Journey, PT (Physical therapy). PT treatment  11-05-2021  Leta Speller I, CCC-SLP (Speech pathology). SLP treatment.  11-05-2021 Plaster, Cruzita Lederer, PT (Physical therapy). PT treatment  11-05-2021 Hulda Marin, OT (Occupational therapy). OT treatment.  11-03-2021 Marzetta Board, CCC-SLP (Speech pathology). SLP treatment.  11-03-2021 Plaster, Cruzita Lederer, PT (Physical therapy). PT treatment  11-03-2021 Hulda Marin, OT (Occupational therapy). OT treatment.  11-03-2021 Germain Osgood, RN. PREP class  10-29-2021 Leta Speller I,  CCC-SLP (Speech pathology). SLP treatment.  10-29-2021 Hulda Marin, OT (Occupational therapy). OT treatment.  10-29-2021 Drema Balzarine, PTA  (Physical therapy). PT treatment  10-27-2021 Arliss Journey, PT (Physical therapy). PT treatment  10-27-2021 Leta Speller I, CCC-SLP (Speech pathology). SLP treatment.  10-27-2021 Hulda Marin, OT (Occupational therapy). OT treatment.  10-27-2021 Germain Osgood, RN. PREP class  10-22-2021 Leta Speller I, CCC-SLP (Speech pathology). SLP treatment.  10-22-2021 Drema Balzarine, PTA  (Physical therapy). PT treatment  10-22-2021 Hulda Marin, OT (Occupational therapy). OT treatment.  10-20-2021 Marzetta Board, CCC-SLP (Speech pathology). SLP treatment.  10-20-2021 Hulda Marin, OT (Occupational therapy). OT treatment.  10-20-2021 Arliss Journey, PT (Physical therapy). PT treatment  10-20-2021 Germain Osgood, RN. PREP class  10-15-2021 Hulda Marin, OT (Occupational therapy). OT treatment.  10-15-2021 Arliss Journey, PT (Physical therapy). PT treatment  10-15-2021 Leta Speller I, CCC-SLP (Speech pathology). SLP treatment.  10-14-2021 Jobe Igo, MD (Podiatry). Unable to view encounter.  10-13-2021 Marzetta Board, CCC-SLP (Speech pathology). SLP treatment.  10-13-2021 Delton [place], OT (Occupational therapy). OT treatment.  10-13-2021 Plaster, Cruzita Lederer, PT (Physical therapy). PT treatment  10-08-2021 [name], MD (Neurology). Follow up visit.  10-08-2021 Marzetta Board, CCC-SLP (Speech pathology). SLP treatment.  10-08-2021 Hans Eden, OT (Occupational therapy). OT treatment.  10-08-2021 Plaster, Cruzita Lederer, PT (Physical therapy). PT treatment  Hospital visits:  None in previous 6 months  Medications: Outpatient Encounter Medications as of 12/28/2021  Medication Sig Note   ACCU-CHEK FASTCLIX LANCETS MISC USE TO CHECK BLOOD SUGARS TWICE DAILY     aspirin EC 81 MG tablet Take 1 tablet (81 mg total) by mouth daily.    atorvastatin (LIPITOR) 10 MG tablet Take 1 tablet (10 mg total) by mouth daily.    Biotin  10 MG CAPS Take 1 tablet by mouth daily.    calcium carbonate (OSCAL) 1500 (600 Ca) MG TABS tablet Take 1 tablet (1,500 mg total) by mouth daily with breakfast.    carbidopa-levodopa (SINEMET IR) 25-100 MG tablet TAKE ONE TABLET BY MOUTH four TIMES DAILY. FOLLOW titration instructions provided separately in writing    Cholecalciferol 125 MCG (5000 UT) TABS Take 1 tablet (5,000 Units total) by mouth daily.    diclofenac Sodium (VOLTAREN) 1 % GEL APPLY 2 GRAMS TOPICALLY TO THE AFFECTED AREA FOUR TIMES DAILY    fluticasone (FLONASE) 50 MCG/ACT nasal spray instill TWO SPRAYS into each nostril daily AS DIRECTED    glucose blood test strip Use as instructed to check BS twice daily DX: E11.22    Insulin Pen Needle (B-D ULTRAFINE III SHORT PEN) 31G X 8 MM MISC Use as directed    loratadine (CLARITIN) 10 MG tablet Take 1 tablet by mouth daily    metFORMIN (GLUCOPHAGE) 500 MG tablet Take 1 tablet (500 mg total) by mouth daily with breakfast. Dinner    Multiple Vitamins-Minerals (ONE-A-DAY WOMENS 50+ ADVANTAGE) TABS Take 1 tablet by mouth daily    NON FORMULARY 2 each. Calm 08/13/2020: Taking 2 at night before bed.    Omega-3 Fatty Acids (FISH OIL) 1000 MG CAPS Take 1 capsule by mouth daily    omeprazole (PRILOSEC) 40 MG capsule TAKE 1 CAPSULE BY MOUTH EVERY DAY BEFORE A MEAL    telmisartan-hydrochlorothiazide (MICARDIS HCT) 40-12.5 MG tablet Take 1 tablet by mouth daily.    Turmeric 500 MG CAPS Take 1 capsule by mouth 2 (two) times daily.    vitamin C (ASCORBIC ACID) 500 MG tablet Take 1 tablet (500 mg total) by mouth daily.    XULTOPHY 100-3.6 UNIT-MG/ML SOPN INJECT 25 UNITS SUBCUTANEOUSLY DAILY    No facility-administered encounter medications on file as of 12/28/2021.  Reviewed chart for medication changes ahead of medication coordination  call.  No hospital visits since last care coordination call/Pharmacist visit.  No medication changes indicated   BP Readings from Last 3 Encounters:  12/22/21 130/70  11/17/21 128/80  10/08/21 (!) 156/90    Lab Results  Component Value Date   HGBA1C 6.3 (H) 11/17/2021     Patient obtains medications through Adherence Packaging  90 Days   Last adherence delivery included:  Telmisartan-HCTZ 40-12.5 mg daily at breakfast Carbidopa-levodopa 25-100 mg before breakfast, at lunch, with evening meal and bedtime Omeprazole 40 mg daily before breakfast Vitamin C 1000 mg daily at breakfast Calcium carbonate 600 mg daily at breakfast Loratadine 10 mg daily at breakfast One-a Day Womens Multivitamin daily at breakfast Asa 81 mg daily at breakfast Vitamin D3 5, 000 units at bedtime Metformin 500 mg at bedtime Pioglitazone 15 mg at bedtime (Was informed patient is tapering off of med so new script for MWF was sent for 45 days and then she is to stop medication) Flonase 2 sprays each nostril daily  Atorvastatin 10 mg at bedtime  Patient declined (meds) last month: None  Patient is due for next adherence delivery on: 01-07-2022  Called patient and reviewed medications and coordinated delivery.  This delivery to include: Telmisartan-HCTZ 40-12.5 mg daily at breakfast Carbidopa-levodopa 25-100 mg before breakfast, at lunch, with evening meal and bedtime Omeprazole 40 mg daily before breakfast Vitamin C 1000 mg daily at breakfast Calcium carbonate 600 mg daily at breakfast Loratadine 10 mg daily at breakfast One-a Day Womens Multivitamin daily at breakfast Asa 81 mg daily at  breakfast Vitamin D3 5, 000 units at bedtime Metformin 500 mg at bedtime Flonase 2 sprays each nostril daily  Atorvastatin 10 mg at bedtime   Unable to reach patient to confirm short/acute fill needed  Patient declined the following medications: Unable to reach patient Pioglitazone D/C  Patient needs  refills for: Request sent to PCP Flonase Omeprazole  Unable to Confirmed delivery date of  pharmacy will contact them the morning of delivery.  12-28-2021: 1st attempt left VM 12-29-2021: 2nd attempt left VM 12-30-2021: 3rd attempt left VM  Care Gaps: Covid booster overdue  Star Rating Drugs: Atorvastatin 10 mg- Last filled 10-05-2021 90 DS [facility] Xultophy 25 mg- PAP   [place] [facility] Pharmacist Assistant [phone]

## 2022-01-04 ENCOUNTER — Other Ambulatory Visit: Payer: Self-pay | Admitting: Internal Medicine

## 2022-01-05 NOTE — Progress Notes (Signed)
[facility] PREP Evaluation  Patient Details  Name: April Larson MRN: [id number] Date of Birth: 13-Apr-1950 Age: 71 y.o. PCP: Glendale Chard, MD  Vitals:   01/05/22 1753  BP: 128/62  Pulse: (!) 105  SpO2: 98%  Weight: 168 lb (76.2 kg)     [facility] Eval - 01/05/22 1700       [facility] "PREP" Location   [facility] "PREP" Location [facility]      Referral    Referring Provider [employer] Start Date --   final class date 12/29/21     Measurement   Waist Circumference 41 inches    Hip Circumference 42 inches    Body fat 43.5 percent      Information for Trainer   Goals to keep exercising      Mobility and Daily Activities   I find it easy to walk up or down two or more flights of stairs. 2    I have no trouble taking out the trash. 4    I do housework such as vacuuming and dusting on my own without difficulty. 4    I can easily lift a gallon of milk (8lbs). 4    I can easily walk a mile. 1    I have no trouble reaching into high cupboards or reaching down to pick up something from the floor. 1    I do not have trouble doing out-door work such as Armed forces logistics/support/administrative officer, raking leaves, or gardening. 1      Mobility and Daily Activities   I feel younger than my age. 4    I feel independent. 4    I feel energetic. 2    I live an active life.  2    I feel strong. 2    I feel healthy. 2    I feel active as other people my age. 4      How fit and strong are you.   Fit and Strong Total Score 37            Past Medical History:  Diagnosis Date   Diabetes mellitus without complication ([place])    Hypertension    Substance abuse ([place])    Past Surgical History:  Procedure Laterality Date   EYE SURGERY     Social History   Tobacco Use  Smoking Status Former   Packs/day: 1.00   Years: 34.00   Total pack years: 34.00   Types: Cigarettes   Start date: 34   Quit date: 04/08/2005   Years since quitting: 16.7  Smokeless Tobacco Never  Attended 16 workout  sessions and 9 of 12 educational sessions Fit test: Cardio march test: 265 to 193 (with cane)  Needed reminder to always pick up right foot  Sit to stand: 7 to 9 (no cane) arms down Bicep curl: 16 to 16 Balance somewhat improved. Used cane to perform for single and tandem No cane with double.  Plans on continuing to exercise at [facility] with Silver Sneakers Will be retiring soon  Expressed some vision changes in last 2 wks. Loosing center vision. Has had care with neuro-thinks maybe part of Parkinsons.  Reminded that exercise will be very important for maintaining mobility and preventing falls-seated stepper and recumbent bikes as well as knee lifts at kitchen counter would be good for her to do every day.    Barnett Hatter 01/05/2022, 5:57 PM

## 2022-01-18 DIAGNOSIS — F320 Major depressive disorder, single episode, mild: Secondary | ICD-10-CM | POA: Diagnosis not present

## 2022-01-19 ENCOUNTER — Ambulatory Visit: Payer: Self-pay

## 2022-01-19 NOTE — Patient Instructions (Signed)
Visit Information  Thank you for taking time to visit with me today. Please don't hesitate to contact me if I can be of assistance to you.   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, self-management and patient's adherence to plan as established by provider Determined patient completed her PT, she completed the provider exercise program as well Discussed patient is awaiting to start a Silver Sneakers program and will attend these sessions 3 x weekly Determined patient's balance is stable, she is not using a cane or a walker and denies falls Discussed patient's focus is to continue to perform her daily exercise's and continue to be able to driver herself during daytime hours as discussed with her doctor Educated patient regarding the importance to maintain her daily exercise regimen and to alert her doctor to new symptoms or concerns related to her health  Mailed printed educational materials related to Elderly Nutrition 101 Scheduled next RN CC follow up call with patient following her in person visit with PCP scheduled for 03/22/22         Our next appointment is by telephone on 03/24/22 at 11:00 AM  Please call the care guide team at [phone] if you need to cancel or reschedule your appointment.   If you are experiencing a Mental Health or [place] or need someone to talk to, please call 1-800-273-TALK (toll free, 24 hour hotline) go to [facility] [address], [place] [phone])  Patient verbalizes understanding of instructions and care plan provided today and agrees to view in [place]. Active MyChart status and patient understanding of how to access instructions and care plan  via MyChart confirmed with patient.     Barb Merino, RN, BSN, CCM Care Management Coordinator [place] [facility]  Direct Phone: [phone]

## 2022-01-19 NOTE — Patient Outreach (Signed)
  Care Coordination   Follow Up Visit Note   01/19/2022 Name: April Larson MRN: [id number] DOB: February 16, 1951  April Larson is a 71 y.o. year old female who sees Glendale Chard, MD for primary care. I spoke with  April Larson by phone today.  What matters to the patients health and wellness today?  Patient would like to focus on staying physically fit and maintain her ability to drive during daytime hours.     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, self-management and patient's adherence to plan as established by provider Determined patient completed her PT, she completed the provider exercise program as well Discussed patient is awaiting to start a Silver Sneakers program and will attend these sessions 3 x weekly Determined patient's balance is stable, she is not using a cane or a walker and denies falls Discussed patient's focus is to continue to perform her daily exercise's and continue to be able to driver herself during daytime hours as discussed with her doctor Educated patient regarding the importance to maintain her daily exercise regimen and to alert her doctor to new symptoms or concerns related to her health  Mailed printed educational materials related to Elderly Nutrition 101 Scheduled next RN CC follow up call with patient following her in person visit with PCP scheduled for 03/22/22         SDOH assessments and interventions completed:  No     Care Coordination Interventions Activated:  Yes  Care Coordination Interventions:  Yes, provided   Follow up plan: Follow up call scheduled for 03/24/22 '@11'$ :00 AM    Encounter Outcome:  Pt. Visit Completed

## 2022-01-20 ENCOUNTER — Telehealth: Payer: Self-pay

## 2022-01-20 NOTE — Chronic Care Management (AMB) (Signed)
[employer] patient assistance program notification:  120- day supply of Xultophy 100/3.6 pen was filled on 01/06/2022 and should arrive to the office in 10-14 business days. Patient has 0  refill remaining and enrollment will expire on 04/04/2022.  No further action required, patient due for 2024 re-enrollment.    [name], [place] Pharmacist Assistant [phone]

## 2022-01-21 ENCOUNTER — Ambulatory Visit
Admission: RE | Admit: 2022-01-21 | Discharge: 2022-01-21 | Disposition: A | Discharge status: Home / Self Care | Payer: Medicare (Managed Care) | Source: Ambulatory Visit | Attending: Internal Medicine | Admitting: Internal Medicine

## 2022-01-21 DIAGNOSIS — Z1231 Encounter for screening mammogram for malignant neoplasm of breast: Secondary | ICD-10-CM

## 2022-01-22 ENCOUNTER — Inpatient Hospital Stay: Admission: RE | Admit: 2022-01-22 | Payer: Medicare HMO | Source: Ambulatory Visit

## 2022-03-03 DIAGNOSIS — R102 Pelvic and perineal pain: Secondary | ICD-10-CM | POA: Diagnosis not present

## 2022-03-04 ENCOUNTER — Ambulatory Visit (INDEPENDENT_AMBULATORY_CARE_PROVIDER_SITE_OTHER): Payer: Medicare (Managed Care) | Admitting: Internal Medicine

## 2022-03-04 VITALS — BP 140/80 | HR 86 | Temp 98.4°F | Ht 63.0 in | Wt 163.2 lb

## 2022-03-04 DIAGNOSIS — I129 Hypertensive chronic kidney disease with stage 1 through stage 4 chronic kidney disease, or unspecified chronic kidney disease: Secondary | ICD-10-CM

## 2022-03-04 DIAGNOSIS — K5900 Constipation, unspecified: Secondary | ICD-10-CM | POA: Diagnosis not present

## 2022-03-04 DIAGNOSIS — R102 Pelvic and perineal pain: Secondary | ICD-10-CM | POA: Diagnosis not present

## 2022-03-04 DIAGNOSIS — N182 Chronic kidney disease, stage 2 (mild): Secondary | ICD-10-CM | POA: Diagnosis not present

## 2022-03-04 DIAGNOSIS — Z6828 Body mass index (BMI) 28.0-28.9, adult: Secondary | ICD-10-CM

## 2022-03-04 DIAGNOSIS — E1122 Type 2 diabetes mellitus with diabetic chronic kidney disease: Secondary | ICD-10-CM

## 2022-03-04 NOTE — Progress Notes (Signed)
Barnet Glasgow Martin,acting as a Education administrator for Maximino Greenland, MD.,have documented all relevant documentation on the behalf of Maximino Greenland, MD,as directed by  Maximino Greenland, MD while in the presence of Maximino Greenland, MD.    Subjective:     Patient ID: April Larson , female    DOB: 02/09/1951 , 71 y.o.   MRN: [id number]   Chief Complaint  Patient presents with   Diabetes   Hypertension    HPI  Patient presents today for a DM and BP check. She is accompanied by her sister today.  Patient reports compliance with medications. She states she has been having lower abdominal pain, so severe it makes her fall to the ground. Denies having any urinary sx. She went to Urgent Care yesterday and prescribed abx for UTI.   BP Readings from Last 3 Encounters: 03/04/22 : (!) 140/68 01/05/22 : 128/62 12/22/21 : 130/70    Diabetes She presents for her follow-up diabetic visit. She has type 2 diabetes mellitus. Pertinent negatives for hypoglycemia include no headaches. Associated symptoms include blurred vision. Pertinent negatives for diabetes include no chest pain, no polydipsia, no polyphagia and no polyuria. Risk factors for coronary artery disease include diabetes mellitus, dyslipidemia, hypertension, post-menopausal and sedentary lifestyle. She participates in exercise intermittently. Her breakfast blood glucose is taken between 8-9 am. Her breakfast blood glucose range is generally 70-90 mg/dl. An ACE inhibitor/angiotensin II receptor blocker is being taken. Eye exam is current.  Hypertension This is a chronic problem. The current episode started more than 1 year ago. Condition status: FAIR CONTROL. Associated symptoms include blurred vision. Pertinent negatives include no anxiety, chest pain or headaches. The current treatment provides moderate improvement.     Past Medical History:  Diagnosis Date   Diabetes mellitus without complication ([place])    Hypertension    Substance  abuse ([place])      Family History  Problem Relation Age of Onset   Cancer Mother    Diabetes Father    Cancer Brother    Breast cancer Maternal Aunt    Parkinsonism Neg Hx      Current Outpatient Medications:    ACCU-CHEK FASTCLIX LANCETS MISC, USE TO CHECK BLOOD SUGARS TWICE DAILY, Disp: 100 each, Rfl: 11   aspirin EC 81 MG tablet, Take 1 tablet (81 mg total) by mouth daily., Disp: 90 tablet, Rfl: 1   atorvastatin (LIPITOR) 10 MG tablet, Take 1 tablet (10 mg total) by mouth daily., Disp: 90 tablet, Rfl: 0   Biotin 10 MG CAPS, Take 1 tablet by mouth daily., Disp: 90 capsule, Rfl: 1   calcium carbonate (OSCAL) 1500 (600 Ca) MG TABS tablet, Take 1 tablet (1,500 mg total) by mouth daily with breakfast., Disp: 90 tablet, Rfl: 1   carbidopa-levodopa (SINEMET IR) 25-100 MG tablet, TAKE ONE TABLET BY MOUTH FOUR TIMES DAILY, Disp: 90 tablet, Rfl: 2   Cholecalciferol 125 MCG (5000 UT) TABS, Take 1 tablet (5,000 Units total) by mouth daily., Disp: 90 tablet, Rfl: 1   diclofenac Sodium (VOLTAREN) 1 % GEL, APPLY 2 GRAMS TOPICALLY TO THE AFFECTED AREA FOUR TIMES DAILY, Disp: 100 g, Rfl: 2   fluticasone (FLONASE) 50 MCG/ACT nasal spray, instill TWO SPRAYS into each nostril daily AS DIRECTED, Disp: 16 g, Rfl: 2   glucose blood test strip, Use as instructed to check BS twice daily DX: E11.22, Disp: 180 each, Rfl: 4   Insulin Pen Needle (B-D ULTRAFINE III SHORT PEN) 31G X 8 MM  MISC, Use as directed, Disp: 100 each, Rfl: 11   loratadine (CLARITIN) 10 MG tablet, Take 1 tablet by mouth daily, Disp: 90 tablet, Rfl: 1   metFORMIN (GLUCOPHAGE) 500 MG tablet, Take 1 tablet (500 mg total) by mouth daily with breakfast. Dinner, Disp: 180 tablet, Rfl: 1   Multiple Vitamins-Minerals (ONE-A-DAY WOMENS 50+ ADVANTAGE) TABS, Take 1 tablet by mouth daily, Disp: 90 tablet, Rfl: 1   NON FORMULARY, 2 each. Calm, Disp: , Rfl:    Omega-3 Fatty Acids (FISH OIL) 1000 MG CAPS, Take 1 capsule by mouth daily, Disp: 90 capsule, Rfl:  1   omeprazole (PRILOSEC) 40 MG capsule, TAKE 1 CAPSULE BY MOUTH EVERY DAY BEFORE A MEAL, Disp: 90 capsule, Rfl: 2   telmisartan-hydrochlorothiazide (MICARDIS HCT) 40-12.5 MG tablet, Take 1 tablet by mouth daily., Disp: 90 tablet, Rfl: 1   Turmeric 500 MG CAPS, Take 1 capsule by mouth 2 (two) times daily., Disp: 180 capsule, Rfl: 1   vitamin C (ASCORBIC ACID) 500 MG tablet, Take 1 tablet (500 mg total) by mouth daily., Disp: 90 tablet, Rfl: 1   XULTOPHY 100-3.6 UNIT-MG/ML SOPN, INJECT 25 UNITS SUBCUTANEOUSLY DAILY, Disp: 27 mL, Rfl: 3   No Known Allergies   Review of Systems  Constitutional: Negative.   HENT: Negative.    Eyes:  Positive for blurred vision.  Respiratory: Negative.    Cardiovascular: Negative.  Negative for chest pain.  Gastrointestinal:  Positive for constipation.  Endocrine: Negative for polydipsia, polyphagia and polyuria.  Neurological:  Negative for headaches.     Today's Vitals   03/04/22 1157 03/04/22 1208  BP: (!) 140/68 (!) 140/80  Pulse: 86   Temp: 98.4 F (36.9 C)   TempSrc: Oral   Weight: 163 lb 3.2 oz (74 kg)   Height: 5' 3" (1.6 m)   PainSc: 8    PainLoc: Abdomen    Body mass index is 28.91 kg/m.  Wt Readings from Last 3 Encounters:  03/04/22 163 lb 3.2 oz (74 kg)  01/05/22 168 lb (76.2 kg)  12/09/21 163 lb (73.9 kg)     Objective:  Physical Exam Vitals and nursing note reviewed.  Constitutional:      Appearance: Normal appearance.  HENT:     Head: Normocephalic and atraumatic.     Nose:     Comments: Masked     Mouth/Throat:     Comments: Masked  Eyes:     Extraocular Movements: Extraocular movements intact.  Cardiovascular:     Rate and Rhythm: Normal rate and regular rhythm.     Heart sounds: Normal heart sounds.  Pulmonary:     Effort: Pulmonary effort is normal.     Breath sounds: Normal breath sounds.  Abdominal:     General: Bowel sounds are normal. There is no distension.     Palpations: Abdomen is soft. There is no  mass.     Tenderness: There is no abdominal tenderness. There is no guarding.  Musculoskeletal:     Cervical back: Normal range of motion.  Skin:    General: Skin is warm.  Neurological:     General: No focal deficit present.     Mental Status: She is alert.  Psychiatric:        Mood and Affect: Mood normal.        Behavior: Behavior normal.      Assessment And Plan:     1. Diabetes mellitus with stage 2 chronic kidney disease ([place]) Comments: I will decrease Xultophy to  20 units nightly. She agrees to send in her sugars every Monday so her meds can be adjusted. She will f/u in 3 months. - Hemoglobin A1c - CMP14+EGFR  2. Hypertensive nephropathy Comments: Chronic, uncontrolled. Repeat BP 140/80. No change in meds due to her pelvic discomfort. Reminded to follow low sodium diet. - CMP14+EGFR  3. Pelvic pain Comments: Treated for UTI by Urgent Care, advised to take full course of Abx.  4. Constipation, unspecified constipation type Comments: Likely contributing to her pelvic pain. Advised to try Miralax, if ineffective she will use Dulcolax suppository. She is to notify me next week w/ an update.  5. BMI 28.0-28.9,adult Comments: She is encouraged to aim for at last 150 minutes of exercise per week.     Patient was given opportunity to ask questions. Patient verbalized understanding of the plan and was able to repeat key elements of the plan. All questions were answered to their satisfaction.   I, Maximino Greenland, MD, have reviewed all documentation for this visit. The documentation on 03/04/22 for the exam, diagnosis, procedures, and orders are all accurate and complete.   IF YOU HAVE BEEN REFERRED TO A SPECIALIST, IT MAY TAKE 1-2 WEEKS TO SCHEDULE/PROCESS THE REFERRAL. IF YOU HAVE NOT HEARD FROM US/SPECIALIST IN TWO WEEKS, PLEASE GIVE [place] A CALL AT [phone].   THE PATIENT IS ENCOURAGED TO PRACTICE SOCIAL DISTANCING DUE TO THE COVID-19 PANDEMIC.

## 2022-03-04 NOTE — Patient Instructions (Addendum)
Decrease Xultophy to 20 units nightly  Start Miralax, Gas-X - If no bowel movement, then try Dulcolax suppository  Hypertension, Adult Hypertension is another name for high blood pressure. High blood pressure forces your heart to work harder to pump blood. This can cause problems over time. There are two numbers in a blood pressure reading. There is a top number (systolic) over a bottom number (diastolic). It is best to have a blood pressure that is below 120/80. What are the causes? The cause of this condition is not known. Some other conditions can lead to high blood pressure. What increases the risk? Some lifestyle factors can make you more likely to develop high blood pressure: Smoking. Not getting enough exercise or physical activity. Being overweight. Having too much fat, sugar, calories, or salt (sodium) in your diet. Drinking too much alcohol. Other risk factors include: Having any of these conditions: Heart disease. Diabetes. High cholesterol. Kidney disease. Obstructive sleep apnea. Having a family history of high blood pressure and high cholesterol. Age. The risk increases with age. Stress. What are the signs or symptoms? High blood pressure may not cause symptoms. Very high blood pressure (hypertensive crisis) may cause: Headache. Fast or uneven heartbeats (palpitations). Shortness of breath. Nosebleed. Vomiting or feeling like you may vomit (nauseous). Changes in how you see. Very bad chest pain. Feeling dizzy. Seizures. How is this treated? This condition is treated by making healthy lifestyle changes, such as: Eating healthy foods. Exercising more. Drinking less alcohol. Your doctor may prescribe medicine if lifestyle changes do not help enough and if: Your top number is above 130. Your bottom number is above 80. Your personal target blood pressure may vary. Follow these instructions at home: Eating and drinking  If told, follow the DASH eating plan. To  follow this plan: Fill one half of your plate at each meal with fruits and vegetables. Fill one fourth of your plate at each meal with whole grains. Whole grains include whole-wheat pasta, brown rice, and whole-grain bread. Eat or drink low-fat dairy products, such as skim milk or low-fat yogurt. Fill one fourth of your plate at each meal with low-fat (lean) proteins. Low-fat proteins include fish, chicken without skin, eggs, beans, and tofu. Avoid fatty meat, cured and processed meat, or chicken with skin. Avoid pre-made or processed food. Limit the amount of salt in your diet to less than 1,500 mg each day. Do not drink alcohol if: Your doctor tells you not to drink. You are pregnant, may be pregnant, or are planning to become pregnant. If you drink alcohol: Limit how much you have to: 0-1 drink a day for women. 0-2 drinks a day for men. Know how much alcohol is in your drink. In the [place] one drink equals one 12 oz bottle of beer (355 mL), one 5 oz glass of wine (148 mL), or one 1 oz glass of hard liquor (44 mL). Lifestyle  Work with your doctor to stay at a healthy weight or to lose weight. Ask your doctor what the best weight is for you. Get at least 30 minutes of exercise that causes your heart to beat faster (aerobic exercise) most days of the week. This may include walking, swimming, or biking. Get at least 30 minutes of exercise that strengthens your muscles (resistance exercise) at least 3 days a week. This may include lifting weights or doing Pilates. Do not smoke or use any products that contain nicotine or tobacco. If you need help quitting, ask your doctor. Check  your blood pressure at home as told by your doctor. Keep all follow-up visits. Medicines Take over-the-counter and prescription medicines only as told by your doctor. Follow directions carefully. Do not skip doses of blood pressure medicine. The medicine does not work as well if you skip doses. Skipping doses also  puts you at risk for problems. Ask your doctor about side effects or reactions to medicines that you should watch for. Contact a doctor if: You think you are having a reaction to the medicine you are taking. You have headaches that keep coming back. You feel dizzy. You have swelling in your ankles. You have trouble with your vision. Get help right away if: You get a very bad headache. You start to feel mixed up (confused). You feel weak or numb. You feel faint. You have very bad pain in your: Chest. Belly (abdomen). You vomit more than once. You have trouble breathing. These symptoms may be an emergency. Get help right away. Call 911. Do not wait to see if the symptoms will go away. Do not drive yourself to the hospital. Summary Hypertension is another name for high blood pressure. High blood pressure forces your heart to work harder to pump blood. For most people, a normal blood pressure is less than 120/80. Making healthy choices can help lower blood pressure. If your blood pressure does not get lower with healthy choices, you may need to take medicine. This information is not intended to replace advice given to you by your health care provider. Make sure you discuss any questions you have with your health care provider. Document Revised: 01/08/2021 Document Reviewed: 01/08/2021 Elsevier Patient Education  [place].

## 2022-03-05 LAB — CMP14+EGFR
ALT: 6 IU/L (ref 0–32)
AST: 17 IU/L (ref 0–40)
Albumin/Globulin Ratio: 1.4 (ref 1.2–2.2)
Albumin: 4.3 g/dL (ref 3.9–4.9)
Alkaline Phosphatase: 117 IU/L (ref 44–121)
BUN/Creatinine Ratio: 20 (ref 12–28)
BUN: 18 mg/dL (ref 8–27)
Bilirubin Total: 0.4 mg/dL (ref 0.0–1.2)
CO2: 24 mmol/L (ref 20–29)
Calcium: 10.1 mg/dL (ref 8.7–10.3)
Chloride: 103 mmol/L (ref 96–106)
Creatinine, Ser: 0.89 mg/dL (ref 0.57–1.00)
Globulin, Total: 3.1 g/dL (ref 1.5–4.5)
Glucose: 42 mg/dL — ABNORMAL LOW (ref 70–99)
Potassium: 4.3 mmol/L (ref 3.5–5.2)
Sodium: 142 mmol/L (ref 134–144)
Total Protein: 7.4 g/dL (ref 6.0–8.5)
eGFR: 70 mL/min/{1.73_m2} (ref >59)

## 2022-03-05 LAB — HEMOGLOBIN A1C
Est. average glucose Bld gHb Est-mCnc: 148 mg/dL
Hgb A1c MFr Bld: 6.8 % — ABNORMAL HIGH (ref 4.8–5.6)

## 2022-03-08 ENCOUNTER — Encounter: Payer: Self-pay | Admitting: Internal Medicine

## 2022-03-09 ENCOUNTER — Telehealth: Payer: Self-pay

## 2022-03-09 NOTE — Chronic Care Management (AMB) (Signed)
Faxed 2024 re-enrollment application to [employer] patient assistance for [employer].   [name], [place] Pharmacist Assistant [phone]

## 2022-03-10 DIAGNOSIS — I739 Peripheral vascular disease, unspecified: Secondary | ICD-10-CM | POA: Diagnosis not present

## 2022-03-14 ENCOUNTER — Encounter: Payer: Self-pay | Admitting: Internal Medicine

## 2022-03-15 ENCOUNTER — Encounter: Payer: Self-pay | Admitting: Internal Medicine

## 2022-03-17 ENCOUNTER — Telehealth: Payer: Self-pay | Admitting: *Deleted

## 2022-03-17 NOTE — Progress Notes (Signed)
  Care Coordination Note  03/17/2022 Name: April Larson MRN: [id number] DOB: 10/04/1950  April Larson is a 71 y.o. year old female who is a primary care patient of Glendale Chard, MD and is actively engaged with the care management team. I reached out to April Larson by phone today to assist with re-scheduling a follow up visit with the RN Case Manager  Follow up plan: Unsuccessful telephone outreach attempt made. A HIPAA compliant phone message was left for the patient providing contact information and requesting a return call.   [place]  Direct Dial: [phone]

## 2022-03-21 IMAGING — NM NM DATSCAN
2 series · 12 of 12 positions shown · non-contrast
Comparison: None.

CLINICAL DATA: Portal comprehension difficulties. Short-term memory
loss. Gait disturbance

EXAM:
NUCLEAR MEDICINE BRAIN IMAGING WITH SPECT  (DaTscan )
TECHNIQUE: SPECT images of the brain were obtained after intravenous injection
of radiopharmaceutical. 4 hour post injection imaging. Appropriate
positioning. 130 mg i-STAT given orally for thyroid blockade.
RADIOPHARMACEUTICALS:  4.8 millicuries I 123 Ioflupane

[Series 1: spect - (id) _(id)_tra · 4.1mm · 4.14mm/px · 6 of 128 frames shown]
[frame 11/128]
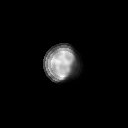
[frame 32/128]
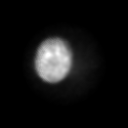
[frame 54/128]
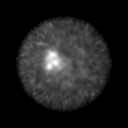
[frame 75/128]
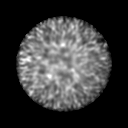
[frame 96/128]
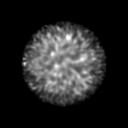
[frame 118/128]
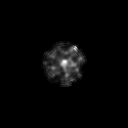

[Series 1: spect - (id) _(id)_cor · 4.1mm · 4.14mm/px · 6 of 128 frames shown]
[frame 11/128]
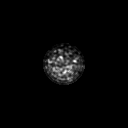
[frame 32/128]
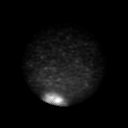
[frame 54/128]
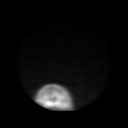
[frame 75/128]
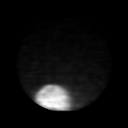
[frame 96/128]
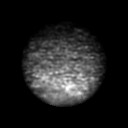
[frame 118/128]
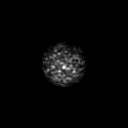

[12 of 12 positions shown; findings below may reference images not displayed]

FINDINGS: There is significant loss of radiotracer activity within the
posterior LEFT and RIGHT striatum. Deficit is greater on the LEFT.
There is decreased activity in the head of the LEFT caudate nucleus
compared to the RIGHT.
IMPRESSION: Decreased radiotracer activity within the striata with greater
deficit on the LEFT. Findings are suggestive of Parkinsonian
syndrome pathology.

Of note, DaTSCAN is not diagnostic of Parkinsonian syndromes, which
remains a clinical diagnosis. DaTscan is an adjuvant test to aid in
the clinical diagnosis of Parkinsonian syndromes.

## 2022-03-22 ENCOUNTER — Ambulatory Visit: Payer: Medicare HMO | Admitting: Internal Medicine

## 2022-03-22 ENCOUNTER — Encounter: Payer: Self-pay | Admitting: Internal Medicine

## 2022-03-25 ENCOUNTER — Telehealth: Payer: Self-pay

## 2022-03-25 ENCOUNTER — Other Ambulatory Visit: Payer: Self-pay | Admitting: Nurse Practitioner

## 2022-03-25 ENCOUNTER — Other Ambulatory Visit: Payer: Self-pay | Admitting: Internal Medicine

## 2022-03-25 DIAGNOSIS — R490 Dysphonia: Secondary | ICD-10-CM

## 2022-03-25 MED ORDER — TELMISARTAN-HCTZ 40-12.5 MG PO TABS: 1.0000 | ORAL_TABLET | Freq: Every morning | ORAL | 1 refills | Status: DC

## 2022-03-25 MED ORDER — ATORVASTATIN CALCIUM 10 MG PO TABS: 10.0000 mg | ORAL_TABLET | Freq: Every day | ORAL | 0 refills | Status: DC

## 2022-03-25 MED ORDER — FLUTICASONE PROPIONATE 50 MCG/ACT NA SUSP: NASAL | 2 refills | Status: DC

## 2022-03-25 MED ORDER — METFORMIN HCL 500 MG PO TABS: ORAL_TABLET | ORAL | 1 refills | Status: DC

## 2022-03-25 NOTE — Chronic Care Management (AMB) (Signed)
Chronic Care Management Pharmacy Assistant   Name: April Larson  MRN: [id number] DOB: 09/27/1950  Reason for Encounter: Medication Review/ Medication coordination   Recent office visits:  03-04-2022 Glendale Chard, MD. Decrease Xultophy to 20 units nightly. Glucose= 42. A1C= 6.8.  01-19-2022 Little, Claudette Stapler, RN (CCM).  Recent consult visits:  03-10-2022 Antionette Poles  Arterial Ultrasound completed.   03-03-2022 Jinny Sanders, PA-C (Urgent care). Visit for pelvic pain. START Augmentin 875-125 mg twice daily for 7 days.  02-19-2022 Clark Brackney (Foot and ankle). Visit for painful nails.  01-05-2022 Germain Osgood, RN. PREP class.  Hospital visits:  None in previous 6 months  Medications: Outpatient Encounter Medications as of 03/25/2022  Medication Sig Note   ACCU-CHEK FASTCLIX LANCETS MISC USE TO CHECK BLOOD SUGARS TWICE DAILY    aspirin EC 81 MG tablet Take 1 tablet (81 mg total) by mouth daily.    atorvastatin (LIPITOR) 10 MG tablet Take 1 tablet (10 mg total) by mouth daily.    Biotin 10 MG CAPS Take 1 tablet by mouth daily.    calcium carbonate (OSCAL) 1500 (600 Ca) MG TABS tablet Take 1 tablet (1,500 mg total) by mouth daily with breakfast.    carbidopa-levodopa (SINEMET IR) 25-100 MG tablet TAKE ONE TABLET BY MOUTH FOUR TIMES DAILY    Cholecalciferol 125 MCG (5000 UT) TABS Take 1 tablet (5,000 Units total) by mouth daily.    diclofenac Sodium (VOLTAREN) 1 % GEL APPLY 2 GRAMS TOPICALLY TO THE AFFECTED AREA FOUR TIMES DAILY    fluticasone (FLONASE) 50 MCG/ACT nasal spray instill TWO SPRAYS into each nostril daily AS DIRECTED    Insulin Pen Needle (B-D ULTRAFINE III SHORT PEN) 31G X 8 MM MISC Use as directed    loratadine (CLARITIN) 10 MG tablet Take 1 tablet by mouth daily    metFORMIN (GLUCOPHAGE) 500 MG tablet Take 1 tablet (500 mg total) by mouth daily with breakfast. Dinner    Multiple Vitamins-Minerals (ONE-A-DAY WOMENS 50+  ADVANTAGE) TABS Take 1 tablet by mouth daily    NON FORMULARY 2 each. Calm 08/13/2020: Taking 2 at night before bed.    Omega-3 Fatty Acids (FISH OIL) 1000 MG CAPS Take 1 capsule by mouth daily    omeprazole (PRILOSEC) 40 MG capsule TAKE 1 CAPSULE BY MOUTH EVERY DAY BEFORE A MEAL    telmisartan-hydrochlorothiazide (MICARDIS HCT) 40-12.5 MG tablet Take 1 tablet by mouth daily.    Turmeric 500 MG CAPS Take 1 capsule by mouth 2 (two) times daily.    vitamin C (ASCORBIC ACID) 500 MG tablet Take 1 tablet (500 mg total) by mouth daily.    XULTOPHY 100-3.6 UNIT-MG/ML SOPN INJECT 25 UNITS SUBCUTANEOUSLY DAILY    No facility-administered encounter medications on file as of 03/25/2022.  Reviewed chart for medication changes ahead of medication coordination call.  No hospital visits since last care coordination call/Pharmacist visit.   No medication changes indicated   BP Readings from Last 3 Encounters:  03/04/22 (!) 140/80  01/05/22 128/62  12/22/21 130/70    Lab Results  Component Value Date   HGBA1C 6.8 (H) 03/04/2022     Patient obtains medications through Adherence Packaging  90 Days   Last adherence delivery included:  Telmisartan-HCTZ 40-12.5 mg daily at breakfast Carbidopa-levodopa 25-100 mg before breakfast, at lunch, with evening meal and bedtime Omeprazole 40 mg daily before breakfast Vitamin C 1000 mg daily at breakfast Calcium carbonate 600 mg daily at breakfast Loratadine 10 mg daily  at breakfast One-a Day Womens Multivitamin daily at breakfast Asa 81 mg daily at breakfast Vitamin D3 5, 000 units at bedtime Metformin 500 mg at bedtime Flonase 2 sprays each nostril daily  Atorvastatin 10 mg at bedtime  Patient declined (meds) last month: Unable to contact patient  Patient is due for next adherence delivery on: 04-08-2022  Called patient and reviewed medications and coordinated delivery.  This delivery to include: Telmisartan-HCTZ 40-12.5 mg daily at  breakfast Carbidopa-levodopa 25-100 mg before breakfast, at lunch, with evening meal and bedtime Omeprazole 40 mg daily before breakfast Vitamin C 1000 mg daily at breakfast Calcium carbonate 600 mg daily at breakfast Loratadine 10 mg daily at breakfast One-a Day Womens Multivitamin daily at breakfast Asa 81 mg daily at breakfast Vitamin D3 5, 000 units at bedtime Metformin 500 mg at bedtime Flonase 2 sprays each nostril daily  Atorvastatin 10 mg at bedtime  Patient declined the following medications:  Unable to reach patient  Patient needs refills for: Sent to PCP Telmisartan-HCTZ  Flonase  Metformin  Flonase  Atorvastatin   Unable to Confirm delivery date of 04-08-2021  pharmacy will contact them the morning of delivery.  03-25-2022:1st attempt left VM 03-26-2022: 2nd attempt left VM 03-31-2022: 3rd attempt left VM  Care Gaps: Covid booster overdue  Star Rating Drugs: Atorvastatin 10 mg- Last filled 01-04-2022 90 DS upstream. Previous 10-05-2021 90 DS Upstream Xultophy 25 mg- PAP   [place] Clinical Pharmacist Assistant [phone]

## 2022-03-25 NOTE — Telephone Encounter (Signed)
Refill Telmisartan HCTZ

## 2022-04-06 NOTE — Progress Notes (Signed)
  Care Coordination Note  04/06/2022 Name: April Larson MRN: [id number] DOB: 1951/03/10  April Larson is a 72 y.o. year old female who is a primary care patient of Glendale Chard, MD and is actively engaged with the care management team. I reached out to April Larson by phone today to assist with re-scheduling a follow up visit with the   Follow up plan: A second Unsuccessful telephone outreach attempt made. A HIPAA compliant phone message was left for the patient providing contact information and requesting a return call.   [place]  Direct Dial: [phone]

## 2022-04-07 NOTE — Progress Notes (Signed)
  Care Coordination Note  04/07/2022 Name: [name] MRN: [id number] DOB: 09-19-1950  April Larson is a 72 y.o. year old female who is a primary care patient of Glendale Chard, MD and is actively engaged with the care management team. I reached out to Pearle Vilikia Watkins Larson by phone today to assist with re-scheduling a follow up visit with the RN Case Manager  Follow up plan: Unsuccessful telephone outreach attempt made. A HIPAA compliant phone message was left for the patient providing contact information and requesting a return call.  We have been unable to make contact with the patient for follow up. The care management team is available to follow up with the patient after provider conversation with the patient regarding recommendation for care management engagement and subsequent re-referral to the care management team.   [place]  Direct Dial: [phone]

## 2022-04-12 DIAGNOSIS — S80862A Insect bite (nonvenomous), left lower leg, initial encounter: Secondary | ICD-10-CM | POA: Diagnosis not present

## 2022-04-12 DIAGNOSIS — I739 Peripheral vascular disease, unspecified: Secondary | ICD-10-CM | POA: Diagnosis not present

## 2022-04-12 DIAGNOSIS — M2011 Hallux valgus (acquired), right foot: Secondary | ICD-10-CM | POA: Diagnosis not present

## 2022-04-12 DIAGNOSIS — M2012 Hallux valgus (acquired), left foot: Secondary | ICD-10-CM | POA: Diagnosis not present

## 2022-04-12 DIAGNOSIS — I70203 Unspecified atherosclerosis of native arteries of extremities, bilateral legs: Secondary | ICD-10-CM | POA: Diagnosis not present

## 2022-04-12 DIAGNOSIS — B351 Tinea unguium: Secondary | ICD-10-CM | POA: Diagnosis not present

## 2022-04-12 DIAGNOSIS — E1142 Type 2 diabetes mellitus with diabetic polyneuropathy: Secondary | ICD-10-CM | POA: Diagnosis not present

## 2022-04-12 DIAGNOSIS — W57XXXA Bitten or stung by nonvenomous insect and other nonvenomous arthropods, initial encounter: Secondary | ICD-10-CM | POA: Diagnosis not present

## 2022-04-12 DIAGNOSIS — B353 Tinea pedis: Secondary | ICD-10-CM | POA: Diagnosis not present

## 2022-04-14 NOTE — Progress Notes (Unsigned)
Patient: April Larson Date of Birth: 09/18/1950  Reason for Visit: Follow up History from: Patient, sister  Primary Neurologist: [place]   ASSESSMENT AND PLAN 72 y.o. year old female   89.  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 Rexene Alberts  HISTORY OF PRESENT ILLNESS: Today 04/15/22 Here with her sister. Within the last 3 weeks her insulin medication was reduced. Her Actos was stopped. 1.5 months ago, had yeast infection, back pain, was impacted. She lives alone, her dog died 2 weeks ago. Has had a few falls. Is done with therapy (OT, PT, cognitive), finished about 4 months ago. Does a good job keeping track of her medications. Takes Sinemet 25/100 mg 4 times daily. Gets pill packs. With vision, is declining, 2 episodes of seeing at eye level, has hard time with looking down low. Has to chew slowly, take her time, takes smaller bites, doesn't cough as much. [name] thinks she would do better if she would follow thru on therapy at home. Last fall was 3 weeks ago, when she bent over forward to pick her dog up. I did her MMSE which was 29/30. BP recheck was 133/78.  HISTORY  Dr. Rexene Alberts 10/08/2021: She reports feeling more or less stable.  She did have a fall since her last visit and jammed her eyeglasses onto the nasal bridge and still has a hypopigmented scar from it.  She thought she had lost her keys and put them accidentally in the trash can and was looking in the big trash container outside in the yard for it and toppled over the trash can and hurt  herself in the process but no serious injuries thankfully.  She is back in outpatient therapy, is going to start twice weekly with neuro rehab, sister reports that she was not really doing the exercises consistently after she had finished therapy last time and she is going to make sure that patient follows through with recommendations and continues with the recommendations and exercises even during the week and afterwards.  Cognitively, patient feels stable.  She takes her levodopa 4 times a day, generally around 7 AM, 10 AM, 5 or 6 PM and 11 PM.  She goes to bed around 11 PM currently as she is not working but she will pick up work again after school starts.  She is going to try to be in bed earlier than.  She naps typically from 4 to 5 PM daily.  She feels that the levodopa is helping, not able to say specifically how.   REVIEW OF SYSTEMS: Out of a complete 14 system review of symptoms, the patient complains only of the following symptoms, and all other reviewed systems are negative.  See HPI  ALLERGIES: No Known Allergies  HOME MEDICATIONS: Outpatient Medications Prior to Visit  Medication Sig Dispense Refill   ACCU-CHEK FASTCLIX LANCETS MISC USE TO CHECK BLOOD SUGARS TWICE DAILY 100 each 11   aspirin EC 81 MG tablet Take 1 tablet (81 mg total) by mouth daily. 90 tablet 1   atorvastatin (LIPITOR) 10 MG tablet Take 1 tablet (10 mg total) by  mouth daily. 90 tablet 0   Biotin 10 MG CAPS Take 1 tablet by mouth daily. 90 capsule 1   calcium carbonate (OSCAL) 1500 (600 Ca) MG TABS tablet Take 1 tablet (1,500 mg total) by mouth daily with breakfast. 90 tablet 1   Cholecalciferol 125 MCG (5000 UT) TABS Take 1 tablet (5,000 Units total) by mouth daily. 90 tablet 1   diclofenac Sodium (VOLTAREN) 1 % GEL APPLY 2 GRAMS TOPICALLY TO THE AFFECTED AREA FOUR TIMES DAILY 100 g 2   fluticasone (FLONASE) 50 MCG/ACT nasal spray instill two SPRAYS into each nostril daily AS DIRECTED 16 g 2   Insulin Pen Needle  (B-D ULTRAFINE III SHORT PEN) 31G X 8 MM MISC Use as directed 100 each 11   loratadine (CLARITIN) 10 MG tablet Take 1 tablet by mouth daily 90 tablet 1   metFORMIN (GLUCOPHAGE) 500 MG tablet TAKE ONE TABLET BY MOUTH EVERYDAY AT BEDTIME 180 tablet 1   Multiple Vitamins-Minerals (ONE-A-DAY WOMENS 50+ ADVANTAGE) TABS Take 1 tablet by mouth daily 90 tablet 1   NON FORMULARY 2 each. Calm     Omega-3 Fatty Acids (FISH OIL) 1000 MG CAPS Take 1 capsule by mouth daily 90 capsule 1   omeprazole (PRILOSEC) 40 MG capsule TAKE 1 CAPSULE BY MOUTH EVERY DAY BEFORE A MEAL 90 capsule 2   telmisartan-hydrochlorothiazide (MICARDIS HCT) 40-12.5 MG tablet Take 1 tablet by mouth every morning. 90 tablet 1   Turmeric 500 MG CAPS Take 1 capsule by mouth 2 (two) times daily. 180 capsule 1   vitamin C (ASCORBIC ACID) 500 MG tablet Take 1 tablet (500 mg total) by mouth daily. 90 tablet 1   XULTOPHY 100-3.6 UNIT-MG/ML SOPN INJECT 25 UNITS SUBCUTANEOUSLY DAILY (Patient taking differently: 14 Units.) 27 mL 3   carbidopa-levodopa (SINEMET IR) 25-100 MG tablet TAKE ONE TABLET BY MOUTH FOUR TIMES DAILY 90 tablet 2   pioglitazone (ACTOS) 15 MG tablet TAKE ONE TABLET BY MOUTH on mondays at bedtime and TAKE ONE TABLET BY MOUTH on wednesdays at bedtime and TAKE ONE TABLET BY MOUTH on fridays at bedtime 45 tablet 1   No facility-administered medications prior to visit.    PAST MEDICAL HISTORY: Past Medical History:  Diagnosis Date   Diabetes mellitus without complication ([place])    Hypertension    Substance abuse ([place])     PAST SURGICAL HISTORY: Past Surgical History:  Procedure Laterality Date   EYE SURGERY      FAMILY HISTORY: Family History  Problem Relation Age of Onset   Cancer Mother    Diabetes Father    Cancer Brother    Breast cancer Maternal Aunt    Parkinsonism Neg Hx     SOCIAL HISTORY: Social History   Socioeconomic History   Marital status: Widowed    Spouse name: Not on file   Number of  children: Not on file   Years of education: Not on file   Highest education level: Not on file  Occupational History   Occupation: retired  Tobacco Use   Smoking status: Former    Packs/day: 1.00    Years: 34.00    Total pack years: 34.00    Types: Cigarettes    Start date: 71    Quit date: 04/08/2005    Years since quitting: 17.0   Smokeless tobacco: Never  Vaping Use   Vaping Use: Never used  Substance and Sexual Activity   Alcohol use: No    Alcohol/week: 0.0 standard drinks of alcohol  Drug use: No   Sexual activity: Not Currently  Other Topics Concern   Not on file  Social History Narrative   Not on file   Social Determinants of Health   Financial Resource Strain: Low Risk  (07/23/2021)   Overall Financial Resource Strain (CARDIA)    Difficulty of Paying Living Expenses: Not hard at all  Food Insecurity: No Food Insecurity (10/28/2021)   Hunger Vital Sign    Worried About Running Out of Food in the Last Year: Never true    Ran Out of Food in the Last Year: Never true  Transportation Needs: No Transportation Needs (10/28/2021)   PRAPARE - Hydrologist (Medical): No    Lack of Transportation (Non-Medical): No  Physical Activity: Inactive (07/23/2021)   Exercise Vital Sign    Days of Exercise per Week: 0 days    Minutes of Exercise per Session: 0 min  Stress: No Stress Concern Present (07/23/2021)   [place]    Feeling of Stress : Not at all  Social Connections: Not on file  Intimate Partner Violence: Not At Risk (07/26/2018)   Humiliation, Afraid, Rape, and Kick questionnaire    Fear of Current or Ex-Partner: No    Emotionally Abused: No    Physically Abused: No    Sexually Abused: No  0 PHYSICAL EXAM  Vitals:   04/15/22 1425  BP: (!) 183/67  Pulse: 89  Weight: 152 lb (68.9 kg)  Height: '5\' 3"'$  (1.6 m)   Body mass index is 26.93 kg/m.    04/15/2022    3:00 PM  10/08/2021   10:59 AM 12/24/2020   10:28 AM  MMSE - Mini Mental State Exam  Orientation to time '5 5 5  '$ Orientation to Place '5 5 5  '$ Registration '3 3 3  '$ Attention/ Calculation '5 4 3  '$ Recall '3 3 3  '$ Language- name 2 objects '2 2 2  '$ Language- repeat '1 1 1  '$ Language- follow 3 step command '3 3 3  '$ Language- read & follow direction '1 1 1  '$ Write a sentence '1 1 1  '$ Copy design 0 0 0  Total score '29 28 27   '$ Generalized: Well developed, in no acute distress  Neurological examination  Mentation: Alert oriented to time, place, history is provided by she and her sister, her sister corrects some inaccuracies . Follows all commands. Speech is slowed. Bradyphrenia noted.  Cranial nerve II-XII: Pupils were equal round reactive to light. EOM are impaired, limitation to upward, downward gaze, slowness and concentration to lateral gaze. Facial sensation and strength were normal.  Head turning and shoulder shrug  were normal and symmetric. Motor: The motor testing reveals 5 over 5 strength of all 4 extremities. Good symmetric motor tone is noted throughout.  Sensory: Sensory testing is intact to soft touch on all 4 extremities. No evidence of extinction is noted.  Coordination: Cerebellar testing reveals good finger-nose-finger and heel-to-shin bilaterally. No tremor.  Gait and station: Pushes off to stand,, I gait is slow and cautious, appears stable, has a quad cane if needed Reflexes: Deep tendon reflexes are symmetric and normal bilaterally.   DIAGNOSTIC DATA (LABS, IMAGING, TESTING) - I reviewed patient records, labs, notes, testing and imaging myself where available.  Lab Results  Component Value Date   WBC 7.4 07/08/2021   HGB 12.0 07/08/2021   HCT 37.1 07/08/2021   MCV 83 07/08/2021   PLT 388 07/08/2021  Component Value Date/Time   NA 142 03/04/2022 1243   K 4.3 03/04/2022 1243   CL 103 03/04/2022 1243   CO2 24 03/04/2022 1243   GLUCOSE 42 (L) 03/04/2022 1243   GLUCOSE 175 (H) 03/27/2020  0224   BUN 18 03/04/2022 1243   CREATININE 0.89 03/04/2022 1243   CALCIUM 10.1 03/04/2022 1243   PROT 7.4 03/04/2022 1243   ALBUMIN 4.3 03/04/2022 1243   AST 17 03/04/2022 1243   ALT 6 03/04/2022 1243   ALKPHOS 117 03/04/2022 1243   BILITOT 0.4 03/04/2022 1243   GFRNONAA 65 04/15/2020 1607   GFRNONAA 59 (L) 03/27/2020 0224   GFRAA 75 04/15/2020 1607   Lab Results  Component Value Date   CHOL 174 11/17/2021   HDL 65 11/17/2021   LDLCALC 96 11/17/2021   TRIG 67 11/17/2021   CHOLHDL 2.7 11/17/2021   Lab Results  Component Value Date   HGBA1C 6.8 (H) 03/04/2022   Lab Results  Component Value Date   VITAMINB12 1,323 (H) 04/15/2020   Lab Results  Component Value Date   TSH 2.750 11/17/2021    Butler Denmark, AGNP-C, DNP 04/15/2022, 3:39 PM [facility] [address], [place], [place] [address] [phone]  I reviewed the above note and documentation by the Nurse Practitioner and agree with the history, exam, assessment and plan as outlined above. I was available for consultation. [name], MD, [name] [facility])

## 2022-04-15 ENCOUNTER — Ambulatory Visit (INDEPENDENT_AMBULATORY_CARE_PROVIDER_SITE_OTHER): Payer: Medicare HMO | Admitting: Neurology

## 2022-04-15 VITALS — BP 183/67 | HR 89 | Ht 63.0 in | Wt 152.0 lb

## 2022-04-15 DIAGNOSIS — G20C Parkinsonism, unspecified: Secondary | ICD-10-CM | POA: Diagnosis not present

## 2022-04-15 MED ORDER — CARBIDOPA-LEVODOPA 25-100 MG PO TABS: ORAL_TABLET | ORAL | 2 refills | Status: DC

## 2022-04-15 NOTE — Patient Instructions (Signed)
Continue current medications Drink more water  We will see you back in 5 months  Please do your learned physical therapy exercises

## 2022-05-03 ENCOUNTER — Other Ambulatory Visit: Payer: Self-pay | Admitting: Internal Medicine

## 2022-05-10 DIAGNOSIS — I70244 Atherosclerosis of native arteries of left leg with ulceration of heel and midfoot: Secondary | ICD-10-CM | POA: Diagnosis not present

## 2022-05-10 DIAGNOSIS — L97429 Non-pressure chronic ulcer of left heel and midfoot with unspecified severity: Secondary | ICD-10-CM | POA: Diagnosis not present

## 2022-06-08 ENCOUNTER — Other Ambulatory Visit: Payer: Self-pay

## 2022-06-08 DIAGNOSIS — I70223 Atherosclerosis of native arteries of extremities with rest pain, bilateral legs: Secondary | ICD-10-CM | POA: Diagnosis not present

## 2022-06-08 DIAGNOSIS — M79669 Pain in unspecified lower leg: Secondary | ICD-10-CM | POA: Diagnosis not present

## 2022-06-08 MED ORDER — OMEPRAZOLE 40 MG PO CPDR: DELAYED_RELEASE_CAPSULE | ORAL | 2 refills | Status: DC

## 2022-06-08 MED ORDER — METFORMIN HCL 500 MG PO TABS: ORAL_TABLET | ORAL | 1 refills | Status: DC

## 2022-06-08 MED ORDER — ATORVASTATIN CALCIUM 10 MG PO TABS: 10.0000 mg | ORAL_TABLET | Freq: Every day | ORAL | 0 refills | Status: DC

## 2022-06-09 ENCOUNTER — Other Ambulatory Visit: Payer: Self-pay

## 2022-06-09 MED ORDER — TRUE METRIX AIR GLUCOSE METER DEVI: 0 refills | Status: DC

## 2022-06-09 MED ORDER — TRUEPLUS LANCETS 28G MISC: 1 refills | Status: DC

## 2022-06-09 MED ORDER — TRUE METRIX BLOOD GLUCOSE TEST VI STRP: ORAL_STRIP | 12 refills | Status: DC

## 2022-06-09 MED ORDER — XULTOPHY 100-3.6 UNIT-MG/ML ~~LOC~~ SOPN: PEN_INJECTOR | SUBCUTANEOUS | 1 refills | Status: DC

## 2022-06-10 ENCOUNTER — Other Ambulatory Visit: Payer: Self-pay

## 2022-06-10 MED ORDER — CARBIDOPA-LEVODOPA 25-100 MG PO TABS: ORAL_TABLET | ORAL | 2 refills | Status: DC

## 2022-06-11 ENCOUNTER — Other Ambulatory Visit: Payer: Self-pay

## 2022-06-11 MED ORDER — METFORMIN HCL 500 MG PO TABS: ORAL_TABLET | ORAL | 1 refills | Status: DC

## 2022-06-11 MED ORDER — OMEPRAZOLE 40 MG PO CPDR: DELAYED_RELEASE_CAPSULE | ORAL | 2 refills | Status: DC

## 2022-06-11 MED ORDER — TRUE METRIX AIR GLUCOSE METER DEVI: 0 refills | Status: AC

## 2022-06-11 MED ORDER — TRUEPLUS LANCETS 28G MISC: 1 refills | Status: AC

## 2022-06-11 MED ORDER — ATORVASTATIN CALCIUM 10 MG PO TABS: 10.0000 mg | ORAL_TABLET | Freq: Every day | ORAL | 0 refills | Status: DC

## 2022-06-11 MED ORDER — TRUE METRIX BLOOD GLUCOSE TEST VI STRP: ORAL_STRIP | 12 refills | Status: DC

## 2022-06-11 MED ORDER — XULTOPHY 100-3.6 UNIT-MG/ML ~~LOC~~ SOPN: PEN_INJECTOR | SUBCUTANEOUS | 1 refills | Status: DC

## 2022-06-13 DIAGNOSIS — I70222 Atherosclerosis of native arteries of extremities with rest pain, left leg: Secondary | ICD-10-CM | POA: Diagnosis not present

## 2022-06-14 ENCOUNTER — Other Ambulatory Visit: Payer: Self-pay

## 2022-06-14 DIAGNOSIS — I70222 Atherosclerosis of native arteries of extremities with rest pain, left leg: Secondary | ICD-10-CM | POA: Diagnosis not present

## 2022-06-14 MED ORDER — BD PEN NEEDLE SHORT U/F 31G X 8 MM MISC: 11 refills | Status: AC

## 2022-06-14 MED ORDER — ROSUVASTATIN CALCIUM 5 MG PO TABS: 5.0000 mg | ORAL_TABLET | Freq: Every day | ORAL | 2 refills | Status: DC

## 2022-06-14 MED ORDER — BD SWAB SINGLE USE REGULAR PADS: MEDICATED_PAD | 0 refills | Status: AC

## 2022-06-14 MED ORDER — CARBIDOPA-LEVODOPA 25-100 MG PO TABS: ORAL_TABLET | ORAL | 2 refills | Status: DC

## 2022-06-14 MED ORDER — TELMISARTAN-HCTZ 40-12.5 MG PO TABS: 1.0000 | ORAL_TABLET | Freq: Every morning | ORAL | 1 refills | Status: DC

## 2022-06-21 ENCOUNTER — Other Ambulatory Visit: Payer: Self-pay

## 2022-06-21 MED ORDER — ATORVASTATIN CALCIUM 10 MG PO TABS: 10.0000 mg | ORAL_TABLET | Freq: Every day | ORAL | 1 refills | Status: DC

## 2022-06-27 ENCOUNTER — Other Ambulatory Visit: Payer: Self-pay | Admitting: Nurse Practitioner

## 2022-06-27 DIAGNOSIS — R490 Dysphonia: Secondary | ICD-10-CM

## 2022-06-29 ENCOUNTER — Ambulatory Visit: Payer: Medicare HMO | Admitting: Physical Therapy

## 2022-06-29 ENCOUNTER — Ambulatory Visit: Payer: Medicare HMO | Admitting: Occupational Therapy

## 2022-06-29 ENCOUNTER — Ambulatory Visit: Payer: Medicare HMO | Admitting: Speech Pathology

## 2022-07-01 ENCOUNTER — Telehealth: Payer: Self-pay

## 2022-07-01 NOTE — Telephone Encounter (Signed)
Lvm for patient to give the office a call back. A representative from [facility], nurse care management called reporting recent falls patient had. Unable to reach pt.

## 2022-07-05 ENCOUNTER — Other Ambulatory Visit: Payer: Self-pay

## 2022-07-05 MED ORDER — ATORVASTATIN CALCIUM 10 MG PO TABS: 10.0000 mg | ORAL_TABLET | Freq: Every day | ORAL | 1 refills | Status: DC

## 2022-07-07 ENCOUNTER — Ambulatory Visit: Payer: Medicare (Managed Care) | Admitting: Internal Medicine

## 2022-07-16 DIAGNOSIS — B353 Tinea pedis: Secondary | ICD-10-CM | POA: Diagnosis not present

## 2022-07-16 DIAGNOSIS — S80862A Insect bite (nonvenomous), left lower leg, initial encounter: Secondary | ICD-10-CM | POA: Diagnosis not present

## 2022-07-16 DIAGNOSIS — I739 Peripheral vascular disease, unspecified: Secondary | ICD-10-CM | POA: Diagnosis not present

## 2022-07-16 DIAGNOSIS — M2012 Hallux valgus (acquired), left foot: Secondary | ICD-10-CM | POA: Diagnosis not present

## 2022-07-16 DIAGNOSIS — E1142 Type 2 diabetes mellitus with diabetic polyneuropathy: Secondary | ICD-10-CM | POA: Diagnosis not present

## 2022-07-16 DIAGNOSIS — M2011 Hallux valgus (acquired), right foot: Secondary | ICD-10-CM | POA: Diagnosis not present

## 2022-07-16 DIAGNOSIS — I70203 Unspecified atherosclerosis of native arteries of extremities, bilateral legs: Secondary | ICD-10-CM | POA: Diagnosis not present

## 2022-07-16 DIAGNOSIS — B351 Tinea unguium: Secondary | ICD-10-CM | POA: Diagnosis not present

## 2022-07-16 DIAGNOSIS — W57XXXA Bitten or stung by nonvenomous insect and other nonvenomous arthropods, initial encounter: Secondary | ICD-10-CM | POA: Diagnosis not present

## 2022-07-21 DIAGNOSIS — I70223 Atherosclerosis of native arteries of extremities with rest pain, bilateral legs: Secondary | ICD-10-CM | POA: Diagnosis not present

## 2022-07-21 DIAGNOSIS — M79669 Pain in unspecified lower leg: Secondary | ICD-10-CM | POA: Diagnosis not present

## 2022-07-23 NOTE — Therapy (Deleted)
[facility] [address] [place], [place], [address] Phone: [phone]   Fax:  [phone]  Patient Details  Name: April Larson MRN: [id number] Date of Birth: 27-Feb-1951 Referring Provider:  Dorothyann Peng, MD  Encounter Date: 07/27/2022  Speech Therapy Parkinson's Disease Screen   Decibel Level today: *** dB  (WNL=70-72 dB) with sound level meter 30cm away from pt's mouth. Pt's conversational volume has *** since last treatment course  Pt {does does not:27788} report difficulty with swallowing, which {does does not:27788} warrant further evaluation  Pt reports cognitive function has remained the same/declined since prior therapy course  Pt would benefit from speech-language eval for dysarthria bedside swallow eval modified barium swallow eval - please order via EPIC or call [phone] to schedule  Pt does does not require speech therapy services at this time. Recommend ST screen in another *** months  Maia Breslow, CCC-SLP 07/23/2022, 1:30 PM  [facility] [address] [place], [place], [address] Phone: [phone]   Fax:  [phone]

## 2022-07-25 DIAGNOSIS — I70211 Atherosclerosis of native arteries of extremities with intermittent claudication, right leg: Secondary | ICD-10-CM | POA: Diagnosis not present

## 2022-07-27 ENCOUNTER — Ambulatory Visit: Payer: Medicare HMO | Admitting: Occupational Therapy

## 2022-07-27 ENCOUNTER — Ambulatory Visit: Payer: Medicare HMO | Admitting: Physical Therapy

## 2022-07-27 ENCOUNTER — Ambulatory Visit: Payer: Medicare HMO | Admitting: Speech Pathology

## 2022-07-27 NOTE — Therapy (Deleted)
[facility] [address] [place], [place], [address] Phone: [phone]   Fax:  [phone]  Patient Details  Name: April Larson MRN: [id number] Date of Birth: 03/05/1951 Referring Provider:  Dorothyann Peng, MD  Encounter Date: 07/27/2022  Speech Therapy Parkinson's Disease Screen   Decibel Level today: ***dB  (WNL=70-72 dB) with sound level meter 30cm away from pt's mouth. Pt's conversational volume has *** since last treatment course  Pt {does does not:27788} report difficulty with swallowing, which {does does not:27788} warrant further evaluation  Pt reports *** (cognition)  Pt would benefit from speech-language eval for dysarthria bedside swallow eval modified barium swallow eval - please order via EPIC or call [phone] to schedule  Pt does does not require speech therapy services at this time. Recommend ST screen in another *** months  Maia Breslow, CCC-SLP 07/27/2022, 7:43 AM  [facility] [address] [place], [place], [address] Phone: [phone]   Fax:  [phone]

## 2022-08-03 ENCOUNTER — Encounter: Payer: Self-pay | Admitting: Internal Medicine

## 2022-08-03 ENCOUNTER — Ambulatory Visit (INDEPENDENT_AMBULATORY_CARE_PROVIDER_SITE_OTHER): Payer: Medicare HMO | Admitting: Internal Medicine

## 2022-08-03 VITALS — BP 132/78 | HR 93 | Temp 98.5°F | Ht 63.0 in | Wt 134.6 lb

## 2022-08-03 DIAGNOSIS — R634 Abnormal weight loss: Secondary | ICD-10-CM | POA: Diagnosis not present

## 2022-08-03 DIAGNOSIS — G20C Parkinsonism, unspecified: Secondary | ICD-10-CM

## 2022-08-03 DIAGNOSIS — F4321 Adjustment disorder with depressed mood: Secondary | ICD-10-CM | POA: Diagnosis not present

## 2022-08-03 DIAGNOSIS — N182 Chronic kidney disease, stage 2 (mild): Secondary | ICD-10-CM

## 2022-08-03 DIAGNOSIS — E559 Vitamin D deficiency, unspecified: Secondary | ICD-10-CM | POA: Diagnosis not present

## 2022-08-03 DIAGNOSIS — I129 Hypertensive chronic kidney disease with stage 1 through stage 4 chronic kidney disease, or unspecified chronic kidney disease: Secondary | ICD-10-CM

## 2022-08-03 DIAGNOSIS — E1122 Type 2 diabetes mellitus with diabetic chronic kidney disease: Secondary | ICD-10-CM

## 2022-08-03 NOTE — Progress Notes (Signed)
I,[name],acting as a scribe for [name], MD.,have documented all relevant documentation on the behalf of [name], MD,as directed by  [name], MD while in the presence of [name], MD.    Subjective:     Patient ID: April Larson , female    DOB: Nov 29, 1950 , 72 y.o.   MRN: [id number]   Chief Complaint  Patient presents with   Diabetes   Hypertension   Hyperlipidemia    HPI  Patient presents today for a DM and BP & chol check. She is accompanied by her sister today.  Her sister states that pt will be moving in with their brother since she is no longer to care for herself without assistance. Patient reports compliance with medications.  She is frustrated due to the impact Parkinson's has had on her mobility.   Denies headache, chest pain, and SOB.     She adds, having vision trouble. Sister states she has this issue even with her glasses on. Patient reports she can't see if she happens to drop something on the floor, she can't see where to pick up object, she describes" blurry" . Also while walking she tends to look up instead of straight ahead. Patient now needs guidance to walk, to make sure she does not have a fall.   Diabetes She presents for her follow-up diabetic visit. She has type 2 diabetes mellitus. Pertinent negatives for hypoglycemia include no headaches. Associated symptoms include blurred vision. Pertinent negatives for diabetes include no chest pain, no polydipsia, no polyphagia and no polyuria. Risk factors for coronary artery disease include diabetes mellitus, dyslipidemia, hypertension, post-menopausal and sedentary lifestyle. She participates in exercise intermittently. Her breakfast blood glucose is taken between 8-9 am. Her breakfast blood glucose range is generally 70-90 mg/dl. An ACE inhibitor/angiotensin II receptor blocker is being taken. Eye exam is current.  Hypertension This is a chronic problem. The  current episode started more than 1 year ago. Condition status: FAIR CONTROL. Associated symptoms include blurred vision. Pertinent negatives include no anxiety, chest pain or headaches. The current treatment provides moderate improvement.  Hyperlipidemia Pertinent negatives include no chest pain.     Past Medical History:  Diagnosis Date   Diabetes mellitus without complication (HCC)    Hypertension    Substance abuse (HCC)      Family History  Problem Relation Age of Onset   Cancer Mother    Diabetes Father    Cancer Brother    Breast cancer Maternal Aunt    Parkinsonism Neg Hx      Current Outpatient Medications:    ACCU-CHEK FASTCLIX LANCETS MISC, USE TO CHECK BLOOD SUGARS TWICE DAILY, Disp: 100 each, Rfl: 11   Alcohol Swabs (B-D SINGLE USE SWABS REGULAR) PADS, USE AS NEEDED., Disp: 100 each, Rfl: 0   aspirin EC 81 MG tablet, Take 1 tablet (81 mg total) by mouth daily., Disp: 90 tablet, Rfl: 1   atorvastatin (LIPITOR) 10 MG tablet, Take 1 tablet (10 mg total) by mouth daily., Disp: 90 tablet, Rfl: 1   Biotin 10 MG CAPS, Take 1 tablet by mouth daily., Disp: 90 capsule, Rfl: 1   Blood Glucose Monitoring Suppl (TRUE METRIX AIR GLUCOSE METER) DEVI, USE AS DIRECTED TO CHECK BLOOD SUGARS., Disp: 1 each, Rfl: 0   calcium carbonate (OSCAL) 1500 (600 Ca) MG TABS tablet, Take 1 tablet (1,500 mg total) by mouth daily with breakfast., Disp: 90 tablet, Rfl: 1   carbidopa-levodopa (SINEMET  IR) 25-100 MG tablet, TAKE ONE TABLET BY MOUTH BEFORE BREAKFAST and TAKE ONE TABLET BY MOUTH AT NOON and TAKE ONE TABLET BY MOUTH EVERY EVENING and TAKE ONE TABLET BY MOUTH EVERYDAY AT BEDTIME, Disp: 90 tablet, Rfl: 2   Cholecalciferol 125 MCG (5000 UT) TABS, Take 1 tablet (5,000 Units total) by mouth daily., Disp: 90 tablet, Rfl: 1   diclofenac Sodium (VOLTAREN) 1 % GEL, APPLY 2 GRAMS TOPICALLY TO THE AFFECTED AREA FOUR TIMES DAILY, Disp: 100 g, Rfl: 2   fluticasone (FLONASE) 50 MCG/ACT nasal spray, Instill  two SPRAYS into each nostril daily AS DIRECTED, Disp: 16 g, Rfl: 2   glucose blood (TRUE METRIX BLOOD GLUCOSE TEST) test strip, Use as instructed, Disp: 100 each, Rfl: 12   Insulin Degludec-Liraglutide (XULTOPHY) 100-3.6 UNIT-MG/ML SOPN, INJECT 14 UNITS SUBCUTANEOUSLY DAILY., Disp: 15 mL, Rfl: 1   Insulin Pen Needle (B-D ULTRAFINE III SHORT PEN) 31G X 8 MM MISC, Use as directed, Disp: 100 each, Rfl: 11   loratadine (CLARITIN) 10 MG tablet, Take 1 tablet by mouth daily, Disp: 90 tablet, Rfl: 1   metFORMIN (GLUCOPHAGE) 500 MG tablet, TAKE ONE TABLET BY MOUTH EVERYDAY AT BEDTIME, Disp: 180 tablet, Rfl: 1   Multiple Vitamins-Minerals (ONE-A-DAY WOMENS 50+ ADVANTAGE) TABS, Take 1 tablet by mouth daily, Disp: 90 tablet, Rfl: 1   Omega-3 Fatty Acids (FISH OIL) 1000 MG CAPS, Take 1 capsule by mouth daily, Disp: 90 capsule, Rfl: 1   omeprazole (PRILOSEC) 40 MG capsule, TAKE 1 CAPSULE BY MOUTH EVERY DAY BEFORE A MEAL, Disp: 90 capsule, Rfl: 2   telmisartan-hydrochlorothiazide (MICARDIS HCT) 40-12.5 MG tablet, Take 1 tablet by mouth every morning., Disp: 90 tablet, Rfl: 1   TRUEplus Lancets 28G MISC, USE AS DIRECTED TO CHECK BLOOD SUGARS., Disp: 100 each, Rfl: 1   Turmeric 500 MG CAPS, Take 1 capsule by mouth 2 (two) times daily., Disp: 180 capsule, Rfl: 1   vitamin C (ASCORBIC ACID) 500 MG tablet, Take 1 tablet (500 mg total) by mouth daily., Disp: 90 tablet, Rfl: 1   clopidogrel (PLAVIX) 75 MG tablet, Take 75 mg by mouth daily., Disp: , Rfl:    NON FORMULARY, 2 each. Calm (Patient not taking: Reported on 08/03/2022), Disp: , Rfl:    No Known Allergies   Review of Systems  Constitutional: Negative.   Eyes:  Positive for blurred vision.  Respiratory: Negative.    Cardiovascular: Negative.  Negative for chest pain.  Endocrine: Negative for polydipsia, polyphagia and polyuria.  Musculoskeletal:  Positive for gait problem.       Ambulatory w/ cane  Skin: Negative.   Neurological:  Negative for headaches.   Psychiatric/Behavioral: Negative.       Today's Vitals   08/03/22 1534 08/03/22 1602  BP: (!) 140/82 132/78  Pulse: 93   Temp: 98.5 F (36.9 C)   SpO2: 98%   Weight: 134 lb 9.6 oz (61.1 kg)   Height: 5\' 3"  (1.6 m)    Body mass index is 23.84 kg/m.  Wt Readings from Last 3 Encounters:  08/04/22 134 lb (60.8 kg)  08/03/22 134 lb 9.6 oz (61.1 kg)  04/15/22 152 lb (68.9 kg)    Objective:  Physical Exam Vitals and nursing note reviewed.  Constitutional:      Appearance: Normal appearance.  HENT:     Head: Normocephalic and atraumatic.  Eyes:     Extraocular Movements: Extraocular movements intact.  Cardiovascular:     Rate and Rhythm: Normal rate and regular rhythm.  Heart sounds: Normal heart sounds.  Pulmonary:     Effort: Pulmonary effort is normal.     Breath sounds: Normal breath sounds.  Musculoskeletal:     Cervical back: Normal range of motion.  Skin:    General: Skin is warm.  Neurological:     Mental Status: She is alert and oriented to person, place, and time.     Gait: Gait abnormal.  Psychiatric:        Mood and Affect: Mood normal.        Behavior: Behavior normal.         Assessment And Plan:     1. Diabetes mellitus with stage 2 chronic kidney disease (HCC) Comments: Chronic, I will check labs as below. I will adjust meds as needed. She will f/u in 3-4 months. - CMP14+EGFR - CBC - Hemoglobin A1c  2. Hypertensive nephropathy Comments: Chronic , fair control. Goal BP<120/80. She is encouraged to follow low sodium diet. No med changes. She will c/w telmisartan/hct 40/12.5mg  daily.  3. Atypical parkinsonism Comments: Chronic, most recent Neuro notes reviewed. She is encouraged to take meds as prescribed. - VITAMIN D 25 Hydroxy (Vit-D Deficiency, Fractures) - Specimen status report  4. Adjustment disorder with depressed mood Comments: She is having difficulty dealing with her neurologic disorder. - Ambulatory referral to Psychology  5.  Unintentional weight loss Comments: She has lost 18 lbs since January. I will check prealbumin levels. Decreased appetite possibly related to mood. - CBC - TSH - Prealbumin   Patient was given opportunity to ask questions. Patient verbalized understanding of the plan and was able to repeat key elements of the plan. All questions were answered to their satisfaction.   I, [name], MD, have reviewed all documentation for this visit. The documentation on 08/03/22 for the exam, diagnosis, procedures, and orders are all accurate and complete.   IF YOU HAVE BEEN REFERRED TO A SPECIALIST, IT MAY TAKE 1-2 WEEKS TO SCHEDULE/PROCESS THE REFERRAL. IF YOU HAVE NOT HEARD FROM US/SPECIALIST IN TWO WEEKS, PLEASE GIVE [place] A CALL AT [phone].   THE PATIENT IS ENCOURAGED TO PRACTICE SOCIAL DISTANCING DUE TO THE COVID-19 PANDEMIC.

## 2022-08-03 NOTE — Patient Instructions (Signed)

## 2022-08-04 ENCOUNTER — Ambulatory Visit (INDEPENDENT_AMBULATORY_CARE_PROVIDER_SITE_OTHER): Payer: Medicare PPO

## 2022-08-04 ENCOUNTER — Ambulatory Visit: Payer: Medicare HMO | Admitting: Internal Medicine

## 2022-08-04 VITALS — Ht 63.0 in | Wt 134.0 lb

## 2022-08-04 DIAGNOSIS — Z0000 Encounter for general adult medical examination without abnormal findings: Secondary | ICD-10-CM

## 2022-08-04 LAB — CMP14+EGFR
ALT: 5 IU/L (ref 0–32)
AST: 11 IU/L (ref 0–40)
Albumin/Globulin Ratio: 1.4 (ref 1.2–2.2)
Albumin: 4.3 g/dL (ref 3.9–4.9)
Alkaline Phosphatase: 198 IU/L — ABNORMAL HIGH (ref 44–121)
BUN/Creatinine Ratio: 28 (ref 12–28)
BUN: 22 mg/dL (ref 8–27)
Bilirubin Total: <0.2 mg/dL (ref 0.0–1.2)
CO2: 23 mmol/L (ref 20–29)
Calcium: 10.1 mg/dL (ref 8.7–10.3)
Chloride: 101 mmol/L (ref 96–106)
Creatinine, Ser: 0.8 mg/dL (ref 0.57–1.00)
Globulin, Total: 3 g/dL (ref 1.5–4.5)
Glucose: 106 mg/dL — ABNORMAL HIGH (ref 70–99)
Potassium: 4.4 mmol/L (ref 3.5–5.2)
Sodium: 140 mmol/L (ref 134–144)
Total Protein: 7.3 g/dL (ref 6.0–8.5)
eGFR: 79 mL/min/{1.73_m2} (ref >59)

## 2022-08-04 LAB — CBC
Hematocrit: 35.3 % (ref 34.0–46.6)
Hemoglobin: 11.4 g/dL (ref 11.1–15.9)
MCH: 27.7 pg (ref 26.6–33.0)
MCHC: 32.3 g/dL (ref 31.5–35.7)
MCV: 86 fL (ref 79–97)
Platelets: 482 10*3/uL — ABNORMAL HIGH (ref 150–450)
RBC: 4.12 x10E6/uL (ref 3.77–5.28)
RDW: 12.9 % (ref 11.7–15.4)
WBC: 7.5 10*3/uL (ref 3.4–10.8)

## 2022-08-04 LAB — PREALBUMIN: PREALBUMIN: 22 mg/dL (ref 10–36)

## 2022-08-04 LAB — HEMOGLOBIN A1C
Est. average glucose Bld gHb Est-mCnc: 148 mg/dL
Hgb A1c MFr Bld: 6.8 % — ABNORMAL HIGH (ref 4.8–5.6)

## 2022-08-04 LAB — TSH: TSH: 2.21 u[IU]/mL (ref 0.450–4.500)

## 2022-08-04 NOTE — Patient Instructions (Signed)
April Larson , Thank you for taking time to come for your Medicare Wellness Visit. I appreciate your ongoing commitment to your health goals. Please review the following plan we discussed and let me know if I can assist you in the future.   These are the goals we discussed:  Goals       I want to keep working with [employer] to help me improve and get stronger      Care Coordination Interventions: Evaluation of current treatment plan related to Atypical Parkinsonism, self-management and patient's adherence to plan as established by provider Determined patient completed her PT, she completed the provider exercise program as well Discussed patient is awaiting to start a Silver Sneakers program and will attend these sessions 3 x weekly Determined patient's balance is stable, she is not using a cane or a walker and denies falls Discussed patient's focus is to continue to perform her daily exercise's and continue to be able to driver herself during daytime hours as discussed with her doctor Educated patient regarding the importance to maintain her daily exercise regimen and to alert her doctor to new symptoms or concerns related to her health  Mailed printed educational materials related to Elderly Nutrition 101 Scheduled next RN CC follow up call with patient following her in person visit with PCP scheduled for 03/22/22       Manage My Medicine      Timeframe:  Long-Range Goal Priority:  High Start Date:                             Expected End Date:                       Follow Up Date 08/18/2021  In Progress:   - call for medicine refill 2 or 3 days before it runs out - call if I am sick and can't take my medicine - keep a list of all the medicines I take; vitamins and herbals too - use an alarm clock or phone to remind me to take my medicine  -Patient to be onboarded Why is this important?   These steps will help you keep on track with your medicines.         Patient Stated       06/20/2019, no goals       Patient Stated      06/19/2020, no goals      Patient Stated      07/23/2021, no goals      Patient Stated      08/04/2022, no goals      Weight (lb) < 200 lb (90.7 kg) (pt-stated)      Wants to get 170 pounds        This is a list of the screening recommended for you and due dates:  Health Maintenance  Topic Date Due   COVID-19 Vaccine (6 - 2023-24 season) 12/04/2021   Eye exam for diabetics  05/26/2022   Flu Shot  11/04/2022   Yearly kidney health urinalysis for diabetes  11/18/2022   Complete foot exam   11/18/2022   Hemoglobin A1C  02/02/2023   Yearly kidney function blood test for diabetes  08/03/2023   Medicare Annual Wellness Visit  08/04/2023   Mammogram  01/22/2024   Colon Cancer Screening  03/17/2025   DTaP/Tdap/Td vaccine (2 - Td or Tdap) 09/17/2025   Pneumonia Vaccine  Completed  DEXA scan (bone density measurement)  Completed   Hepatitis C Screening: USPSTF Recommendation to screen - Ages 74-79 yo.  Completed   Zoster (Shingles) Vaccine  Completed   HPV Vaccine  Aged Out    Advanced directives: Advance directive discussed with you today.   Conditions/risks identified: none  Next appointment: Follow up in one year for your annual wellness visit    Preventive Care 65 Years and Older, Female Preventive care refers to lifestyle choices and visits with your health care provider that can promote health and wellness. What does preventive care include? A yearly physical exam. This is also called an annual well check. Dental exams once or twice a year. Routine eye exams. Ask your health care provider how often you should have your eyes checked. Personal lifestyle choices, including: Daily care of your teeth and gums. Regular physical activity. Eating a healthy diet. Avoiding tobacco and drug use. Limiting alcohol use. Practicing safe sex. Taking low-dose aspirin every day. Taking vitamin and mineral supplements as recommended by  your health care provider. What happens during an annual well check? The services and screenings done by your health care provider during your annual well check will depend on your age, overall health, lifestyle risk factors, and family history of disease. Counseling  Your health care provider may ask you questions about your: Alcohol use. Tobacco use. Drug use. Emotional well-being. Home and relationship well-being. Sexual activity. Eating habits. History of falls. Memory and ability to understand (cognition). Work and work Astronomer. Reproductive health. Screening  You may have the following tests or measurements: Height, weight, and BMI. Blood pressure. Lipid and cholesterol levels. These may be checked every 5 years, or more frequently if you are over 61 years old. Skin check. Lung cancer screening. You may have this screening every year starting at age 71 if you have a 30-pack-year history of smoking and currently smoke or have quit within the past 15 years. Fecal occult blood test (FOBT) of the stool. You may have this test every year starting at age 15. Flexible sigmoidoscopy or colonoscopy. You may have a sigmoidoscopy every 5 years or a colonoscopy every 10 years starting at age 21. Hepatitis C blood test. Hepatitis B blood test. Sexually transmitted disease (STD) testing. Diabetes screening. This is done by checking your blood sugar (glucose) after you have not eaten for a while (fasting). You may have this done every 1-3 years. Bone density scan. This is done to screen for osteoporosis. You may have this done starting at age 31. Mammogram. This may be done every 1-2 years. Talk to your health care provider about how often you should have regular mammograms. Talk with your health care provider about your test results, treatment options, and if necessary, the need for more tests. Vaccines  Your health care provider may recommend certain vaccines, such as: Influenza  vaccine. This is recommended every year. Tetanus, diphtheria, and acellular pertussis (Tdap, Td) vaccine. You may need a Td booster every 10 years. Zoster vaccine. You may need this after age 64. Pneumococcal 13-valent conjugate (PCV13) vaccine. One dose is recommended after age 67. Pneumococcal polysaccharide (PPSV23) vaccine. One dose is recommended after age 50. Talk to your health care provider about which screenings and vaccines you need and how often you need them. This information is not intended to replace advice given to you by your health care provider. Make sure you discuss any questions you have with your health care provider. Document Released: 04/18/2015 Document Revised: 12/10/2015 Document  Reviewed: 01/21/2015 Elsevier Interactive Patient Education  2017 [employer].  Fall Prevention in the Home Falls can cause injuries. They can happen to people of all ages. There are many things you can do to make your home safe and to help prevent falls. What can I do on the outside of my home? Regularly fix the edges of walkways and driveways and fix any cracks. Remove anything that might make you trip as you walk through a door, such as a raised step or threshold. Trim any bushes or trees on the path to your home. Use bright outdoor lighting. Clear any walking paths of anything that might make someone trip, such as rocks or tools. Regularly check to see if handrails are loose or broken. Make sure that both sides of any steps have handrails. Any raised decks and porches should have guardrails on the edges. Have any leaves, snow, or ice cleared regularly. Use sand or salt on walking paths during winter. Clean up any spills in your garage right away. This includes oil or grease spills. What can I do in the bathroom? Use night lights. Install grab bars by the toilet and in the tub and shower. Do not use towel bars as grab bars. Use non-skid mats or decals in the tub or shower. If you  need to sit down in the shower, use a plastic, non-slip stool. Keep the floor dry. Clean up any water that spills on the floor as soon as it happens. Remove soap buildup in the tub or shower regularly. Attach bath mats securely with double-sided non-slip rug tape. Do not have throw rugs and other things on the floor that can make you trip. What can I do in the bedroom? Use night lights. Make sure that you have a light by your bed that is easy to reach. Do not use any sheets or blankets that are too big for your bed. They should not hang down onto the floor. Have a firm chair that has side arms. You can use this for support while you get dressed. Do not have throw rugs and other things on the floor that can make you trip. What can I do in the kitchen? Clean up any spills right away. Avoid walking on wet floors. Keep items that you use a lot in easy-to-reach places. If you need to reach something above you, use a strong step stool that has a grab bar. Keep electrical cords out of the way. Do not use floor polish or wax that makes floors slippery. If you must use wax, use non-skid floor wax. Do not have throw rugs and other things on the floor that can make you trip. What can I do with my stairs? Do not leave any items on the stairs. Make sure that there are handrails on both sides of the stairs and use them. Fix handrails that are broken or loose. Make sure that handrails are as long as the stairways. Check any carpeting to make sure that it is firmly attached to the stairs. Fix any carpet that is loose or worn. Avoid having throw rugs at the top or bottom of the stairs. If you do have throw rugs, attach them to the floor with carpet tape. Make sure that you have a light switch at the top of the stairs and the bottom of the stairs. If you do not have them, ask someone to add them for you. What else can I do to help prevent falls? Wear shoes that: Do not  have high heels. Have rubber  bottoms. Are comfortable and fit you well. Are closed at the toe. Do not wear sandals. If you use a stepladder: Make sure that it is fully opened. Do not climb a closed stepladder. Make sure that both sides of the stepladder are locked into place. Ask someone to hold it for you, if possible. Clearly mark and make sure that you can see: Any grab bars or handrails. First and last steps. Where the edge of each step is. Use tools that help you move around (mobility aids) if they are needed. These include: Canes. Walkers. Scooters. Crutches. Turn on the lights when you go into a dark area. Replace any light bulbs as soon as they burn out. Set up your furniture so you have a clear path. Avoid moving your furniture around. If any of your floors are uneven, fix them. If there are any pets around you, be aware of where they are. Review your medicines with your doctor. Some medicines can make you feel dizzy. This can increase your chance of falling. Ask your doctor what other things that you can do to help prevent falls. This information is not intended to replace advice given to you by your health care provider. Make sure you discuss any questions you have with your health care provider. Document Released: 01/16/2009 Document Revised: 08/28/2015 Document Reviewed: 04/26/2014 Elsevier Interactive Patient Education  2017 [employer].

## 2022-08-04 NOTE — Progress Notes (Signed)
I connected with  Crystalynn Vilikia Watkins Larson on 08/04/22 by a audio enabled telemedicine application and verified that I am speaking with the correct person using two identifiers.  Patient Location: Home  Provider Location: Office/Clinic  I discussed the limitations of evaluation and management by telemedicine. The patient expressed understanding and agreed to proceed.  Subjective:   April Larson is a 72 y.o. female who presents for Medicare Annual (Subsequent) preventive examination.  Review of Systems     Cardiac Risk Factors include: advanced age (>82men, >73 women);diabetes mellitus;hypertension     Objective:    Today's Vitals   08/04/22 1501  Weight: 134 lb (60.8 kg)  Height: 5\' 3"  (1.6 m)   Body mass index is 23.74 kg/m.     08/04/2022    3:07 PM 10/08/2021    9:29 AM 10/08/2021    8:13 AM 07/23/2021    3:46 PM 01/09/2021    8:01 AM 01/09/2021    7:21 AM 01/06/2021    1:25 PM  Advanced Directives  Does Patient Have a Medical Advance Directive? No No No No No No No  Would patient like information on creating a medical advance directive?    No - Patient declined Yes (ED - Information included in AVS)  Yes (ED - Information included in AVS)    Current Medications (verified) Outpatient Encounter Medications as of 08/04/2022  Medication Sig   ACCU-CHEK FASTCLIX LANCETS MISC USE TO CHECK BLOOD SUGARS TWICE DAILY   Alcohol Swabs (B-D SINGLE USE SWABS REGULAR) PADS USE AS NEEDED.   aspirin EC 81 MG tablet Take 1 tablet (81 mg total) by mouth daily.   atorvastatin (LIPITOR) 10 MG tablet Take 1 tablet (10 mg total) by mouth daily.   Biotin 10 MG CAPS Take 1 tablet by mouth daily.   Blood Glucose Monitoring Suppl (TRUE METRIX AIR GLUCOSE METER) DEVI USE AS DIRECTED TO CHECK BLOOD SUGARS.   calcium carbonate (OSCAL) 1500 (600 Ca) MG TABS tablet Take 1 tablet (1,500 mg total) by mouth daily with breakfast.   carbidopa-levodopa (SINEMET IR) 25-100 MG tablet TAKE  ONE TABLET BY MOUTH BEFORE BREAKFAST and TAKE ONE TABLET BY MOUTH AT NOON and TAKE ONE TABLET BY MOUTH EVERY EVENING and TAKE ONE TABLET BY MOUTH EVERYDAY AT BEDTIME   Cholecalciferol 125 MCG (5000 UT) TABS Take 1 tablet (5,000 Units total) by mouth daily.   clopidogrel (PLAVIX) 75 MG tablet Take 75 mg by mouth daily.   diclofenac Sodium (VOLTAREN) 1 % GEL APPLY 2 GRAMS TOPICALLY TO THE AFFECTED AREA FOUR TIMES DAILY   fluticasone (FLONASE) 50 MCG/ACT nasal spray Instill two SPRAYS into each nostril daily AS DIRECTED   glucose blood (TRUE METRIX BLOOD GLUCOSE TEST) test strip Use as instructed   Insulin Degludec-Liraglutide (XULTOPHY) 100-3.6 UNIT-MG/ML SOPN INJECT 14 UNITS SUBCUTANEOUSLY DAILY.   Insulin Pen Needle (B-D ULTRAFINE III SHORT PEN) 31G X 8 MM MISC Use as directed   loratadine (CLARITIN) 10 MG tablet Take 1 tablet by mouth daily   metFORMIN (GLUCOPHAGE) 500 MG tablet TAKE ONE TABLET BY MOUTH EVERYDAY AT BEDTIME   Multiple Vitamins-Minerals (ONE-A-DAY WOMENS 50+ ADVANTAGE) TABS Take 1 tablet by mouth daily   Omega-3 Fatty Acids (FISH OIL) 1000 MG CAPS Take 1 capsule by mouth daily   omeprazole (PRILOSEC) 40 MG capsule TAKE 1 CAPSULE BY MOUTH EVERY DAY BEFORE A MEAL   telmisartan-hydrochlorothiazide (MICARDIS HCT) 40-12.5 MG tablet Take 1 tablet by mouth every morning.   TRUEplus Lancets 28G  MISC USE AS DIRECTED TO CHECK BLOOD SUGARS.   Turmeric 500 MG CAPS Take 1 capsule by mouth 2 (two) times daily.   vitamin C (ASCORBIC ACID) 500 MG tablet Take 1 tablet (500 mg total) by mouth daily.   NON FORMULARY 2 each. Calm (Patient not taking: Reported on 08/03/2022)   No facility-administered encounter medications on file as of 08/04/2022.    Allergies (verified) Patient has no known allergies.   History: Past Medical History:  Diagnosis Date   Diabetes mellitus without complication (HCC)    Hypertension    Substance abuse (HCC)    Past Surgical History:  Procedure Laterality Date    EYE SURGERY     Family History  Problem Relation Age of Onset   Cancer Mother    Diabetes Father    Cancer Brother    Breast cancer Maternal Aunt    Parkinsonism Neg Hx    Social History   Socioeconomic History   Marital status: Widowed    Spouse name: Not on file   Number of children: Not on file   Years of education: Not on file   Highest education level: Not on file  Occupational History   Occupation: retired  Tobacco Use   Smoking status: Former    Packs/day: 1.00    Years: 34.00    Additional pack years: 0.00    Total pack years: 34.00    Types: Cigarettes    Start date: 35    Quit date: 04/08/2005    Years since quitting: 17.3   Smokeless tobacco: Never  Vaping Use   Vaping Use: Never used  Substance and Sexual Activity   Alcohol use: No    Alcohol/week: 0.0 standard drinks of alcohol   Drug use: No   Sexual activity: Not Currently  Other Topics Concern   Not on file  Social History Narrative   Not on file   Social Determinants of Health   Financial Resource Strain: Low Risk  (08/04/2022)   Overall Financial Resource Strain (CARDIA)    Difficulty of Paying Living Expenses: Not hard at all  Food Insecurity: No Food Insecurity (08/04/2022)   Hunger Vital Sign    Worried About Running Out of Food in the Last Year: Never true    Ran Out of Food in the Last Year: Never true  Transportation Needs: No Transportation Needs (08/04/2022)   PRAPARE - Administrator, Civil Service (Medical): No    Lack of Transportation (Non-Medical): No  Physical Activity: Inactive (08/04/2022)   Exercise Vital Sign    Days of Exercise per Week: 0 days    Minutes of Exercise per Session: 0 min  Stress: No Stress Concern Present (08/04/2022)   [employer] of Occupational Health - Occupational Stress Questionnaire    Feeling of Stress : Not at all  Social Connections: Not on file    Tobacco Counseling Counseling given: Not Answered   Clinical  Intake:  Pre-visit preparation completed: Yes  Pain : No/denies pain     Nutritional Status: BMI of 19-24  Normal Nutritional Risks: None Diabetes: Yes  How often do you need to have someone help you when you read instructions, pamphlets, or other written materials from your doctor or pharmacy?: 1 - Never  Diabetic? Yes Nutrition Risk Assessment:  Has the patient had any N/V/D within the last 2 months?  No  Does the patient have any non-healing wounds?  No  Has the patient had any unintentional weight loss or  weight gain?  Yes   Diabetes:  Is the patient diabetic?  Yes  If diabetic, was a CBG obtained today?  No  Did the patient bring in their glucometer from home?  No  How often do you monitor your CBG's? Twice daily.   Financial Strains and Diabetes Management:  Are you having any financial strains with the device, your supplies or your medication? No .  Does the patient want to be seen by Chronic Care Management for management of their diabetes?  No  Would the patient like to be referred to a Nutritionist or for Diabetic Management?  No   Diabetic Exams:  Diabetic Eye Exam: Overdue for diabetic eye exam. Pt has been advised about the importance in completing this exam. Patient advised to call and schedule an eye exam. Diabetic Foot Exam: Completed 11/17/2021   Interpreter Needed?: No  Information entered by :: NAllen LPN   Activities of Daily Living    08/04/2022    3:09 PM  In your present state of health, do you have any difficulty performing the following activities:  Hearing? 1  Comment decreased hearing of right ear  Vision? 1  Difficulty concentrating or making decisions? 1  Comment trouble with memory at times  Walking or climbing stairs? 0  Dressing or bathing? 0  Doing errands, shopping? 0  Preparing Food and eating ? N  Using the Toilet? N  In the past six months, have you accidently leaked urine? N  Do you have problems with loss of bowel  control? N  Managing your Medications? N  Managing your Finances? N  Housekeeping or managing your Housekeeping? N    Patient Care Team: Dorothyann Peng, MD as PCP - General (Internal Medicine) Sallye Lat, MD as Consulting Physician (Ophthalmology)  Indicate any recent Medical Services you may have received from other than [facility] providers in the past year (date may be approximate).     Assessment:   This is a routine wellness examination for Deija.  Hearing/Vision screen Vision Screening - Comments:: Regular eye exams, [facility]  Dietary issues and exercise activities discussed: Current Exercise Habits: The patient does not participate in regular exercise at present   Goals Addressed             This Visit's Progress    Patient Stated       08/04/2022, no goals       Depression Screen    08/04/2022    3:09 PM 08/03/2022    3:35 PM 03/04/2022   11:52 AM 07/23/2021    3:47 PM 07/08/2021    3:24 PM 06/19/2020   11:11 AM 04/08/2020    2:29 PM  PHQ 2/9 Scores  PHQ - 2 Score 0 0 0 0 0 0 0  PHQ- 9 Score  0       Not completed       patient was talking about the history of her family    Fall Risk    08/04/2022    3:07 PM 08/03/2022    3:35 PM 03/04/2022   11:56 AM 07/23/2021    3:46 PM 07/08/2021    3:23 PM  Fall Risk   Falls in the past year? 1 0 1 1 0  Comment trying to reach up higher   tripped   Number falls in past yr: 1 0 1 1 0  Injury with Fall? 0 0 1 0 1  Risk for fall due to : History of fall(s);Medication side effect  No Fall Risks Impaired balance/gait;Impaired mobility Medication side effect No Fall Risks  Follow up Falls prevention discussed;Education provided;Falls evaluation completed Falls evaluation completed Education provided Falls evaluation completed;Education provided;Falls prevention discussed Falls evaluation completed    FALL RISK PREVENTION PERTAINING TO THE HOME:  Any stairs in or around the home? Yes  If so, are there any without  handrails? No  Home free of loose throw rugs in walkways, pet beds, electrical cords, etc? Yes  Adequate lighting in your home to reduce risk of falls? Yes   ASSISTIVE DEVICES UTILIZED TO PREVENT FALLS:  Life alert? No  Use of a cane, walker or w/c? No  Grab bars in the bathroom? No  Shower chair or bench in shower? Yes  Elevated toilet seat or a handicapped toilet? Yes   TIMED UP AND GO:  Was the test performed? No .      Cognitive Function:    04/15/2022    3:00 PM 10/08/2021   10:59 AM 12/24/2020   10:28 AM  MMSE - Mini Mental State Exam  Orientation to time 5 5 5   Orientation to Place 5 5 5   Registration 3 3 3   Attention/ Calculation 5 4 3   Recall 3 3 3   Language- name 2 objects 2 2 2   Language- repeat 1 1 1   Language- follow 3 step command 3 3 3   Language- read & follow direction 1 1 1   Write a sentence 1 1 1   Copy design 0 0 0  Total score 29 28 27         08/04/2022    3:10 PM 07/23/2021    3:48 PM 06/19/2020   11:13 AM 06/20/2019   12:26 PM 07/26/2018    2:24 PM  6CIT Screen  What Year? 0 points 0 points 0 points 0 points 0 points  What month? 0 points 0 points 0 points 0 points 0 points  What time? 0 points 0 points 0 points 0 points 0 points  Count back from 20 0 points 0 points 0 points 0 points 0 points  Months in reverse 0 points 0 points 0 points 0 points 0 points  Repeat phrase 0 points 0 points 0 points 0 points 0 points  Total Score 0 points 0 points 0 points 0 points 0 points    Immunizations Immunization History  Administered Date(s) Administered   Fluad Quad(high Dose 65+) 01/01/2019, 12/27/2019, 12/22/2021   Influenza, High Dose Seasonal PF 01/12/2018, 01/01/2019   Influenza-Unspecified 12/30/2020   PFIZER(Purple Top)SARS-COV-2 Vaccination 04/26/2019, 05/18/2019, 03/22/2020, 09/22/2020   Pfizer Covid-19 Vaccine Bivalent Booster 2yrs & up 01/10/2021   Pneumococcal Conjugate-13 10/13/2018   Pneumococcal Polysaccharide-23 03/02/2017   Tdap  09/18/2015   Zoster Recombinat (Shingrix) 11/22/2018, 02/25/2019   Zoster, Live 11/04/2015    TDAP status: Up to date  Flu Vaccine status: Up to date  Pneumococcal vaccine status: Up to date  Covid-19 vaccine status: Completed vaccines  Qualifies for Shingles Vaccine? Yes   Zostavax completed Yes   Shingrix Completed?: Yes  Screening Tests Health Maintenance  Topic Date Due   COVID-19 Vaccine (6 - 2023-24 season) 12/04/2021   OPHTHALMOLOGY EXAM  05/26/2022   Medicare Annual Wellness (AWV)  07/24/2022   INFLUENZA VACCINE  11/04/2022   Diabetic kidney evaluation - Urine ACR  11/18/2022   FOOT EXAM  11/18/2022   HEMOGLOBIN A1C  02/02/2023   Diabetic kidney evaluation - eGFR measurement  08/03/2023   MAMMOGRAM  01/22/2024   COLONOSCOPY (Pts 45-40yrs Insurance coverage  will need to be confirmed)  03/17/2025   DTaP/Tdap/Td (2 - Td or Tdap) 09/17/2025   Pneumonia Vaccine 59+ Years old  Completed   DEXA SCAN  Completed   Hepatitis C Screening  Completed   Zoster Vaccines- Shingrix  Completed   HPV VACCINES  Aged Out    Health Maintenance  Health Maintenance Due  Topic Date Due   COVID-19 Vaccine (6 - 2023-24 season) 12/04/2021   OPHTHALMOLOGY EXAM  05/26/2022   Medicare Annual Wellness (AWV)  07/24/2022    Colorectal cancer screening: Type of screening: Colonoscopy. Completed 03/17/2020. Repeat every 5 years  Mammogram status: Completed 01/21/2022. Repeat every year  Bone Density status: Completed 01/15/2019.   Lung Cancer Screening: (Low Dose CT Chest recommended if Age 72-80 years, 30 pack-year currently smoking OR have quit w/in 15years.) does not qualify.   Lung Cancer Screening Referral: no  Additional Screening:  Hepatitis C Screening: does qualify; Completed 08/11/2012  Vision Screening: Recommended annual ophthalmology exams for early detection of glaucoma and other disorders of the eye. Is the patient up to date with their annual eye exam?  No  Who is the  provider or what is the name of the office in which the patient attends annual eye exams? [facility] If pt is not established with a provider, would they like to be referred to a provider to establish care? No .   Dental Screening: Recommended annual dental exams for proper oral hygiene  Community Resource Referral / Chronic Care Management: CRR required this visit?  No   CCM required this visit?  No      Plan:     I have personally reviewed and noted the following in the patient's chart:   Medical and social history Use of alcohol, tobacco or illicit drugs  Current medications and supplements including opioid prescriptions. Patient is not currently taking opioid prescriptions. Functional ability and status Nutritional status Physical activity Advanced directives List of other physicians Hospitalizations, surgeries, and ER visits in previous 12 months Vitals Screenings to include cognitive, depression, and falls Referrals and appointments  In addition, I have reviewed and discussed with patient certain preventive protocols, quality metrics, and best practice recommendations. A written personalized care plan for preventive services as well as general preventive health recommendations were provided to patient.     Barb Merino, LPN   04/10/1094   Nurse Notes: none  Due to this being a virtual visit, the after visit summary with patients personalized plan was offered to patient via mail or my-chart.  Patient would like to access on my-chart

## 2022-08-06 LAB — SPECIMEN STATUS REPORT

## 2022-08-06 LAB — VITAMIN D 25 HYDROXY (VIT D DEFICIENCY, FRACTURES): Vit D, 25-Hydroxy: 74 ng/mL (ref 30.0–100.0)

## 2022-08-10 DIAGNOSIS — H26493 Other secondary cataract, bilateral: Secondary | ICD-10-CM | POA: Diagnosis not present

## 2022-08-10 DIAGNOSIS — G231 Progressive supranuclear ophthalmoplegia [Steele-Richardson-Olszewski]: Secondary | ICD-10-CM | POA: Diagnosis not present

## 2022-08-10 DIAGNOSIS — Z961 Presence of intraocular lens: Secondary | ICD-10-CM | POA: Diagnosis not present

## 2022-08-10 DIAGNOSIS — H5000 Unspecified esotropia: Secondary | ICD-10-CM | POA: Diagnosis not present

## 2022-08-10 DIAGNOSIS — H04123 Dry eye syndrome of bilateral lacrimal glands: Secondary | ICD-10-CM | POA: Diagnosis not present

## 2022-08-10 DIAGNOSIS — H1045 Other chronic allergic conjunctivitis: Secondary | ICD-10-CM | POA: Diagnosis not present

## 2022-08-10 DIAGNOSIS — E119 Type 2 diabetes mellitus without complications: Secondary | ICD-10-CM | POA: Diagnosis not present

## 2022-08-10 DIAGNOSIS — Z794 Long term (current) use of insulin: Secondary | ICD-10-CM | POA: Diagnosis not present

## 2022-08-12 ENCOUNTER — Ambulatory Visit: Payer: Medicare HMO | Admitting: Speech Pathology

## 2022-08-12 ENCOUNTER — Ambulatory Visit: Payer: Medicare HMO | Admitting: Physical Therapy

## 2022-08-12 ENCOUNTER — Ambulatory Visit: Payer: Medicare HMO | Admitting: Occupational Therapy

## 2022-08-18 DIAGNOSIS — I739 Peripheral vascular disease, unspecified: Secondary | ICD-10-CM | POA: Diagnosis not present

## 2022-08-18 DIAGNOSIS — I7789 Other specified disorders of arteries and arterioles: Secondary | ICD-10-CM | POA: Diagnosis not present

## 2022-08-19 DIAGNOSIS — H04123 Dry eye syndrome of bilateral lacrimal glands: Secondary | ICD-10-CM | POA: Diagnosis not present

## 2022-08-19 DIAGNOSIS — H26492 Other secondary cataract, left eye: Secondary | ICD-10-CM | POA: Diagnosis not present

## 2022-08-19 DIAGNOSIS — H35363 Drusen (degenerative) of macula, bilateral: Secondary | ICD-10-CM | POA: Diagnosis not present

## 2022-08-19 DIAGNOSIS — Z961 Presence of intraocular lens: Secondary | ICD-10-CM | POA: Diagnosis not present

## 2022-08-19 DIAGNOSIS — G231 Progressive supranuclear ophthalmoplegia [Steele-Richardson-Olszewski]: Secondary | ICD-10-CM | POA: Diagnosis not present

## 2022-08-19 LAB — HM DIABETES EYE EXAM

## 2022-08-26 ENCOUNTER — Encounter: Payer: Self-pay | Admitting: Psychology

## 2022-08-30 ENCOUNTER — Other Ambulatory Visit: Payer: Self-pay | Admitting: Internal Medicine

## 2022-09-13 ENCOUNTER — Ambulatory Visit: Payer: Medicare PPO | Admitting: Neurology

## 2022-09-13 ENCOUNTER — Encounter: Payer: Self-pay | Admitting: Neurology

## 2022-09-13 VITALS — BP 141/77 | HR 88 | Ht 61.0 in | Wt 144.4 lb

## 2022-09-13 DIAGNOSIS — Z9181 History of falling: Secondary | ICD-10-CM | POA: Diagnosis not present

## 2022-09-13 DIAGNOSIS — G20C Parkinsonism, unspecified: Secondary | ICD-10-CM | POA: Diagnosis not present

## 2022-09-13 DIAGNOSIS — R413 Other amnesia: Secondary | ICD-10-CM | POA: Diagnosis not present

## 2022-09-13 MED ORDER — RIVASTIGMINE TARTRATE 1.5 MG PO CAPS: 1.5000 mg | ORAL_CAPSULE | Freq: Two times a day (BID) | ORAL | 5 refills | Status: DC

## 2022-09-13 NOTE — Patient Instructions (Signed)
Please use your walker for gait safety, when you go out and cover longer distances.  Please continue with your levodopa 4 times a day.  Try to get back to exercising regularly.  We will start for your memory: Exelon 1.5 mg, 1 pill twice daily. Side effects may include dry eyes, dry mouth, confusion, low pulse, low blood pressure, also GI related side effects (nausea, vomiting, diarrhea, constipation), headaches; rare side effects may include hallucinations and seizures.

## 2022-09-13 NOTE — Progress Notes (Signed)
Subjective:    Patient ID: April Larson is a 72 y.o. female.  HPI    Interim history:   April Larson is a 72 year old right-handed woman with an underlying medical history of hypertension, diabetes, reflux disease, allergies, sleep apnea and borderline obesity, who presents for follow-up consultation of her parkinsonism.  She is accompanied by her sister, April Larson, again today. I last saw her on 10/08/2021, at which time she reported feeling fairly stable.  She did have a recent fall, she had forgetfulness.  She saw Margie Ege, NP on 04/15/22, at which time she was fairly stable. Her MMSE was 29/30 at the time.   Today, 09/13/2022: She reports feeling okay, not great.  She feels more forgetful.  Sister endorses that her memory loss has become worse.  She also does not stay very active and does not always follow through with exercises that she was taught in the past by therapists.  She has stopped going to the gym, since they have a break for the summer as far as work is concerned, she indicates that she would be willing to go back to the gym.  She has fallen about 3 times since her last visit.  She has not sustained any major injuries but did scrape her right elbow when she fell out of bed, she lost her balance as she was getting out of bed.  She does not use a walking aid inside the house but uses a 4-point cane when she goes outside the house but does not actually use the cane, tends to drag it, per sister.  She continues to take Sinemet 4 times a day without side effects.  At the end of this month she is planning to move in with her father into their family home.  April Larson will be about 5 minutes away, currently she is about 14 minutes away from her home.  Patient will be sending her home.   Previously (copied from previous notes for reference):      I saw her on 04/09/2021, at which time she reported not noticing much in the way of benefit with Sinemet, she was taking 1 pill 3  times daily.  She was advised to increase it to 1 pill 4 times daily.  She reported taking longer to process things.   She saw her ophthalmologist in the interim and also neuro-ophthalmologist Dr. Daphine Deutscher in May 2023.  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 I reviewed the note.  I also reviewed Dr. Laruth Bouchard office visit note.   I made a referral to neuro rehab in the interim in May 2023.    I saw her on 12/24/2020, at which time she felt fairly stable, had no recent falls.  She was still driving but limiting herself to daytime driving and familiar routes.  She was noted to be more forgetful and have a tendency to repeat herself per her sister's observation.  I made a referral to neuro rehab.  She was advised to start Sinemet gradual titration.   Subsequently, we requested home health therapy   I saw her on 07/23/2020 at the request of her primary care physician, at which time she reported a several month history of difficulty with her balance, recent fall, problems with fine motor control and speech.   She had a DaTscan on 12/19/2020 and I reviewed the results: IMPRESSION: Decreased radiotracer activity within the striata with greater deficit on the LEFT. Findings are suggestive of Parkinsonian syndrome pathology.   Of  note, DaTSCAN is not diagnostic of Parkinsonian syndromes, which remains a clinical diagnosis. DaTscan is an adjuvant test to aid in the clinical diagnosis of Parkinsonian syndromes.   We called her with her test results.     07/23/20: (She) reports a 3+ month history of difficulty with her balance, recent fall with head injury, feeling wobbly, feeling like she could fall again, problems with fine motor control, slowness, change in her speech.  Her sister reports that she has noticed changes in patient's behavior and that she seems to be less attentive at times, seems to zone out.  She has not had any passing out spells or convulsions or twitching or involuntary movements, no  tremors.  However, she did take a significant fall.  Patient reports that she fell when she was working at [employer].  This was on 03/27/2020.  She was in the process of knocking at the door where the ushers are and the next thing she realized was that she was on the floor.  She did not have any warning sign, no lightheadedness, no spinning sensation, no headache.  She was assisted by a nurse that was onsite.  She was taken to the emergency room.  I reviewed the emergency room record from 03/27/2020.  She went to [facility] ED.   She did sustain a rib fracture of the left 10th rib.  She reports that after she had her rib fracture, she stopped using her CPAP but otherwise she has been compliant with it.  After about a month she resumed using her CPAP.  She has not had any difficulty swallowing but has noticed changes in her speech.  Her sister reports that she seems to speak very deliberately and slowly and also eats slowly.  Patient reports that her handwriting has changed.  It has become much more tremulous and sloppy.  She is to have a good penmanship.   I reviewed your office note from 04/15/2020.  You ordered a brain MRI.   She had a brain MRI with and without contrast on 06/07/2020 and I reviewed the results: IMPRESSION: No evidence of recent infarction, hemorrhage, or mass. No abnormal enhancement.   Mild chronic microvascular ischemic changes.    She had recent labs through your office on 04/15/2020.  Vitamin B12 was 1323, TSH was 1.83, urinalysis negative, CMP showed a glucose of 90, BUN 22, creatinine 0.91, alk phos was elevated at 196, AST and ALT normal.  A1c was mildly elevated at 7.1.  CBC showed WBC at 1.1, hemoglobin 12.4, hematocrit 37.7, platelets mildly elevated at 480.   She had a head CT without contrast on 03/27/2020 and I reviewed the results: IMPRESSION: 1. Left frontal scalp soft tissue swelling without underlying fracture. 2. No acute intracranial abnormality. 3.  Mild generalized atrophy and white matter disease likely reflects the sequela of chronic microvascular ischemia.   I previously evaluated her over 2 years ago for sleep apnea.  She had a baseline sleep study on 04/10/2018 which showed an AHI of 12.3/h, REM AHI of 24.3/h, O2 nadir 88%.  She had a CPAP titration study on 06/05/2018 at which time she was recommended to start CPAP at 9 cm of water pressure.   She did not return for follow-up.  She has been on CPAP.  Current 30-day compliance showed excellent compliance with an average usage of 5 hours and 48 minutes, residual AHI at goal at 3.3/h, leak on the low side, pressure at 9 cm.  Percent use  days greater than 4 hours at 90%.    03/09/18: April Larson is a 72 year old right-handed woman with an underlying medical history of type 2 diabetes, chronic kidney disease, hypertension, seasonal allergies, and obesity, who was previously diagnosed with obstructive sleep apnea. She was placed on CPAP therapy. She has not had re-evaluation in years. Prior sleep study results are not available for my review today. I reviewed your office note from 01/12/2018. She was complaining of memory loss. she had blood work through your office on 01/12/2018, including RPR, B12, lipid profile, A1c, CMP, TSH. I reviewed the test results. Blood tests were benign, A1c 6.5. Her Epworth sleepiness score is 16 out of 24, fatigue score is 42 out of 63. She reports that she could not tolerate CPAP in the past. She reports that she had a sinus infection after she started using CPAP. She estimates that this was about 15 years ago. She is widowed; sadly, her husband passed away in 05-04-2017. She lives alone, no children, has 1 dog in the household, he sleeps on the bed with her. She is retired. She quit smoking in 2004, does not utilize alcohol and does not drink caffeine on a regular basis. Her bedtime is around 11:30 and she is typically asleep at midnight. She does have a TV on in her  bedroom which tends to stay on all night. Rise time is around 6:50 AM. She has nocturia about once per average night and denies recurrent morning headaches. She is not aware of any family history of OSA. She typically takes a nap for an hour around 4 PM daily. She tried a fullface mask in the past for her CPAP machine and it was uncomfortable. She would be willing to get retested and consider CPAP therapy again. She has had some short-term memory issues.    Her Past Medical History Is Significant For: Past Medical History:  Diagnosis Date   Diabetes mellitus without complication (HCC)    Hypertension    Substance abuse (HCC)     Her Past Surgical History Is Significant For: Past Surgical History:  Procedure Laterality Date   EYE SURGERY      Her Family History Is Significant For: Family History  Problem Relation Age of Onset   Cancer Mother    Diabetes Father    Cancer Brother    Breast cancer Maternal Aunt    Parkinsonism Neg Hx     Her Social History Is Significant For: Social History   Socioeconomic History   Marital status: Widowed    Spouse name: Not on file   Number of children: Not on file   Years of education: Not on file   Highest education level: Not on file  Occupational History   Occupation: retired  Tobacco Use   Smoking status: Former    Packs/day: 1.00    Years: 34.00    Additional pack years: 0.00    Total pack years: 34.00    Types: Cigarettes    Start date: 59    Quit date: 04/08/2005    Years since quitting: 17.4   Smokeless tobacco: Never  Vaping Use   Vaping Use: Never used  Substance and Sexual Activity   Alcohol use: No    Alcohol/week: 0.0 standard drinks of alcohol   Drug use: No   Sexual activity: Not Currently  Other Topics Concern   Not on file  Social History Narrative   Not on file   Social Determinants of Health  Financial Resource Strain: Low Risk  (08/04/2022)   Overall Financial Resource Strain (CARDIA)    Difficulty of  Paying Living Expenses: Not hard at all  Food Insecurity: No Food Insecurity (08/04/2022)   Hunger Vital Sign    Worried About Running Out of Food in the Last Year: Never true    Ran Out of Food in the Last Year: Never true  Transportation Needs: No Transportation Needs (08/04/2022)   PRAPARE - Administrator, Civil Service (Medical): No    Lack of Transportation (Non-Medical): No  Physical Activity: Inactive (08/04/2022)   Exercise Vital Sign    Days of Exercise per Week: 0 days    Minutes of Exercise per Session: 0 min  Stress: No Stress Concern Present (08/04/2022)   [employer] of Occupational Health - Occupational Stress Questionnaire    Feeling of Stress : Not at all  Social Connections: Not on file    Her Allergies Are:  No Known Allergies:   Her Current Medications Are:  Outpatient Encounter Medications as of 09/13/2022  Medication Sig   ACCU-CHEK FASTCLIX LANCETS MISC USE TO CHECK BLOOD SUGARS TWICE DAILY   Alcohol Swabs (B-D SINGLE USE SWABS REGULAR) PADS USE AS NEEDED.   aspirin EC 81 MG tablet Take 1 tablet (81 mg total) by mouth daily.   atorvastatin (LIPITOR) 10 MG tablet Take 1 tablet (10 mg total) by mouth daily.   Biotin 10 MG CAPS Take 1 tablet by mouth daily.   Blood Glucose Monitoring Suppl (TRUE METRIX AIR GLUCOSE METER) DEVI USE AS DIRECTED TO CHECK BLOOD SUGARS.   calcium carbonate (OSCAL) 1500 (600 Ca) MG TABS tablet Take 1 tablet (1,500 mg total) by mouth daily with breakfast.   carbidopa-levodopa (SINEMET IR) 25-100 MG tablet TAKE ONE TABLET BY MOUTH BEFORE BREAKFAST daily and TAKE ONE TABLET BY MOUTH AT NOON and TAKE ONE TABLET BY MOUTH EVERY EVENING and TAKE ONE TABLET BY MOUTH EVERYDAY AT BEDTIME   Cholecalciferol 125 MCG (5000 UT) TABS Take 1 tablet (5,000 Units total) by mouth daily.   clopidogrel (PLAVIX) 75 MG tablet Take 75 mg by mouth daily.   diclofenac Sodium (VOLTAREN) 1 % GEL APPLY 2 GRAMS TOPICALLY TO THE AFFECTED AREA FOUR TIMES  DAILY   fluticasone (FLONASE) 50 MCG/ACT nasal spray Instill two SPRAYS into each nostril daily AS DIRECTED   glucose blood (TRUE METRIX BLOOD GLUCOSE TEST) test strip Use as instructed   Insulin Degludec-Liraglutide (XULTOPHY) 100-3.6 UNIT-MG/ML SOPN INJECT 14 UNITS SUBCUTANEOUSLY DAILY.   Insulin Pen Needle (B-D ULTRAFINE III SHORT PEN) 31G X 8 MM MISC Use as directed   loratadine (CLARITIN) 10 MG tablet Take 1 tablet by mouth daily   metFORMIN (GLUCOPHAGE) 500 MG tablet TAKE ONE TABLET BY MOUTH EVERYDAY AT BEDTIME   Multiple Vitamins-Minerals (ONE-A-DAY WOMENS 50+ ADVANTAGE) TABS Take 1 tablet by mouth daily   NON FORMULARY 2 each. Calm   Omega-3 Fatty Acids (FISH OIL) 1000 MG CAPS Take 1 capsule by mouth daily   omeprazole (PRILOSEC) 40 MG capsule TAKE 1 CAPSULE BY MOUTH EVERY DAY BEFORE A MEAL   rivastigmine (EXELON) 1.5 MG capsule Take 1 capsule (1.5 mg total) by mouth 2 (two) times daily.   telmisartan-hydrochlorothiazide (MICARDIS HCT) 40-12.5 MG tablet Take 1 tablet by mouth every morning.   TRUEplus Lancets 28G MISC USE AS DIRECTED TO CHECK BLOOD SUGARS.   Turmeric 500 MG CAPS Take 1 capsule by mouth 2 (two) times daily.   vitamin C (ASCORBIC ACID)  500 MG tablet Take 1 tablet (500 mg total) by mouth daily.   No facility-administered encounter medications on file as of 09/13/2022.  :  Review of Systems:  Out of a complete 14 point review of systems, all are reviewed and negative with the exception of these symptoms as listed below:  Review of Systems  Neurological:        Memory worsening.  Coughs when eating,  drooling. Seeing better since new Rx, still issue with looking down.     Objective:  Neurological Exam  Physical Exam Physical Examination:   Vitals:   09/13/22 1332  BP: (!) 141/77  Pulse: 88    General Examination: The patient is a very pleasant 72 y.o. female in no acute distress. She appears well-developed and well-nourished and well groomed.   HEENT:  Normocephalic, atraumatic, pupils are equal, round and reactive to light, extraocular tracking is quite impaired, slow eye movements side to side but significant limitation to upgaze and downgaze, no nystagmus.  Decrease in eye blink rate noted and moderate facial masking noted, stable.  She has moderate nuchal rigidity and decreased active range of motion in her neck.  She has at times very slow speech, and overall mild hypophonia.  She has minimal dysarthria at times.  She has no obvious sialorrhea, in fact, thicker saliva noted.  Tongue protrudes centrally and palate elevates symmetrically, no carotid bruits.    Chest: Clear to auscultation without wheezing, rhonchi or crackles noted.   Heart: S1+S2+0, regular and normal without murmurs, rubs or gallops noted.    Abdomen: Soft, non-tender and non-distended.   Extremities: There is no pitting edema in the distal lower extremities bilaterally.    Skin: Warm and dry without trophic changes noted.   Musculoskeletal: exam reveals no obvious joint deformities.   Neurologically:  Mental status: The patient is awake, alert and oriented in all 4 spheres. Her immediate and remote memory, attention, language skills and fund of knowledge are fairly appropriate. Thought process is linear.  She has bradyphrenia, and sister provide some details.    09/13/2022    2:10 PM 09/13/2022    1:40 PM 04/15/2022    3:00 PM 10/08/2021   10:59 AM 12/24/2020   10:28 AM  MMSE - Mini Mental State Exam  Orientation to time 5 5 5 5 5   Orientation to Place 5 5 5 5 5   Registration 3 3 3 3 3   Attention/ Calculation 3 3 5 4 3   Recall 2 2 3 3 3   Language- name 2 objects 2 2 2 2 2   Language- repeat 1 1 1 1 1   Language- follow 3 step command 3 3 3 3 3   Language- read & follow direction 1 1 1 1 1   Write a sentence 1 1 1 1 1   Copy design 0  0 0 0  Total score 26  29 28 27    On 12/24/2020: CDT: 3/4, AFT: 9/min.   On 10/08/2021: CDT: 3.5/4, AFT: 6/min.   Cranial nerves II -  XII are as described above under HEENT exam.  Motor exam: Normal bulk, global strength of 4+ out of 5, increase in tone in the upper extremities, no obvious cogwheeling noted.  No resting tremor noted.  Romberg is not tested due to safety concerns.  Moderate bradykinesia.  Fine motor skill testing reveals moderate to impairment on the right and left side.  Cerebellar testing: No dysmetria or intention tremor.  Sensory exam: intact to light touch.  Gait, station  and balance: She stands with mild difficulty and pushes herself up.  Posture is mildly stooped, stance with mildly wider based, left shoulder slightly higher than right. She walks slowly and cautiously, has a 4-point cane, no obvious shuffling noted, slightly more unsteady overall.   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 She had a DaTscan on 12/19/2020 which showed decreased radiotracer activity within the striata with greater deficit on the left. She had evaluation with neuro-ophthalmology in May 2023 and Dr. 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 She had a brain MRI which showed relatively benign findings.  We started Sinemet in September 2022.  We increase this to 1 pill 4 times daily in January 2023.  She tolerates the medication and feels that it continues to be beneficial at this time. She has benefited from therapy as well.  She is advised to continue with Sinemet 1 pill 4 times daily.  She is encouraged to continue with her exercise regimen at home as instructed by her therapist.  Her memory has worsened, she feels more forgetful.  She is advised to start low-dose Exelon 1.5 mg twice daily.  We talked about expectations and benefits, common side  effects as well today.  She is advised to stay well-hydrated and well rested, and start using her walker more consistently when she goes outside for gait safety.  We talked about the importance of fall prevention.  She is getting ready to move in with her brother.  He is the youngest sibling and has retired. April Larson will be about 5 minutes away.  Thankfully, patient has a good support system. She is advised to follow-up in this clinic in about 6 months to see one of our nurse practitioners, sooner if needed.  At the next visit, we can consider increasing her rivastigmine to 3 mg twice daily.  I answered all their questions today and the patient and her sister were in agreement.  I spent 40 minutes in total face-to-face time and in reviewing records during pre-charting, more than 50% of which was spent in counseling and coordination of care, reviewing test results, reviewing medications and treatment regimen and/or in discussing or reviewing the diagnosis of atypical parkinsonism, the prognosis and treatment options. Pertinent laboratory and imaging test results that were available during this visit with the patient were reviewed by me and considered in my medical decision making (see chart for details).

## 2022-09-28 ENCOUNTER — Other Ambulatory Visit: Payer: Self-pay | Admitting: Nurse Practitioner

## 2022-09-28 DIAGNOSIS — R490 Dysphonia: Secondary | ICD-10-CM

## 2022-10-19 ENCOUNTER — Other Ambulatory Visit: Payer: Self-pay | Admitting: Internal Medicine

## 2022-11-03 ENCOUNTER — Telehealth: Payer: Self-pay | Admitting: Neurology

## 2022-11-03 DIAGNOSIS — R634 Abnormal weight loss: Secondary | ICD-10-CM

## 2022-11-03 DIAGNOSIS — G4733 Obstructive sleep apnea (adult) (pediatric): Secondary | ICD-10-CM

## 2022-11-03 NOTE — Telephone Encounter (Signed)
Pt states she has loss a lot of weight and would like to know if she still needs to use her CPAP

## 2022-11-03 NOTE — Telephone Encounter (Signed)
I spoke with the patient.  She states the last weight on file is correct at 144 pounds.  Her previous weight was 171 pounds.  She has a friend who did not have to use her CPAP any longer after losing weight.  She states she was using it consistently but stopped 2 weeks ago.  I recommended that as long as she can tolerate it she should continue using it until we were to get an official order from Dr. Frances Furbish.  I advised that typically our next step would be to get another sleep study to evaluate if she still has sleep apnea.  The patient is amenable to this.  Her sister said she would make sure the patient uses her CPAP.  They were appreciative for the call.  I pulled the most recent wireless report we have of usage and it stops on 06/27/22. See below:

## 2022-11-04 NOTE — Telephone Encounter (Signed)
Since patient was just seen in June 2024, Dr. Frances Furbish can addend her office note with this information and state that patient is wanting to re-test to evaluate OSA after weight loss. Then can place an order for HST.

## 2022-11-10 NOTE — Addendum Note (Signed)
Addended by: Huston Foley on: 11/10/2022 01:29 PM   Modules accepted: Orders

## 2022-11-10 NOTE — Telephone Encounter (Signed)
I have placed an order for a home sleep test.  Please remind patient not to use her AutoPap the night of testing so we can re-assess her sleep apnea diagnosis.  We will call her with her results.

## 2022-11-10 NOTE — Telephone Encounter (Signed)
I called the pt and relayed the message to her from Dr Frances Furbish. I advised an order has been placed for HST. Pt is not to wear autopap the night of the test for accurate sleep apnea diagnosis. Pt will check back in with [place] in a few weeks if she hasn't heard from our office about scheduling. Pt was appreciative for the call.

## 2022-11-11 ENCOUNTER — Ambulatory Visit: Payer: Medicare PPO | Admitting: Physical Therapy

## 2022-11-11 ENCOUNTER — Ambulatory Visit: Payer: Self-pay | Admitting: Occupational Therapy

## 2022-11-11 ENCOUNTER — Ambulatory Visit: Payer: Self-pay | Admitting: Speech Pathology

## 2022-11-11 NOTE — Therapy (Deleted)
[facility] [address] [place], [place], [address] Phone: [phone]   Fax:  [phone]  Patient Details  Name: April Larson MRN: [id number] Date of Birth: 1950-07-22 Referring Provider:  Dorothyann Peng, MD  Encounter Date: 11/11/2022  Speech Therapy Parkinson's Disease Screen   Decibel Level today: ***dB  (WNL=70-72 dB) with sound level meter 30cm away from pt's masked mouth. Pt's conversational volume has *** since last treatment course  Pt {does does not:27788} not report difficulty with swallowing, which {does does not:27788} warrant further evaluation  Pt would benefit from speech-language eval for dysarthria bedside swallow eval modified barium swallow eval - please order via EPIC or call [phone] to schedule  Pt does does not require speech therapy services at this time. Recommend ST screen in another *** months  Maia Breslow, CCC-SLP 11/11/2022, 6:41 AM  [facility] [address] [place], [place], [address] Phone: [phone]   Fax:  [phone]

## 2022-11-15 NOTE — Telephone Encounter (Signed)
Noted, HST- [employer] no auth req.  

## 2022-11-17 ENCOUNTER — Telehealth: Payer: Self-pay | Admitting: Neurology

## 2022-11-17 NOTE — Telephone Encounter (Signed)
 LVM and sent mychart msg informing pt of need to reschedule 03/02/23 appt - NP out

## 2022-11-21 ENCOUNTER — Other Ambulatory Visit: Payer: Self-pay | Admitting: Internal Medicine

## 2022-11-21 ENCOUNTER — Other Ambulatory Visit: Payer: Self-pay | Admitting: Nurse Practitioner

## 2022-11-23 NOTE — Patient Instructions (Incomplete)

## 2022-11-24 ENCOUNTER — Ambulatory Visit (INDEPENDENT_AMBULATORY_CARE_PROVIDER_SITE_OTHER): Payer: Medicare PPO | Admitting: Internal Medicine

## 2022-11-24 ENCOUNTER — Encounter: Payer: Self-pay | Admitting: Internal Medicine

## 2022-11-24 VITALS — BP 130/70 | HR 79 | Temp 98.0°F | Ht 61.0 in | Wt 141.8 lb

## 2022-11-24 DIAGNOSIS — D75839 Thrombocytosis, unspecified: Secondary | ICD-10-CM

## 2022-11-24 DIAGNOSIS — I129 Hypertensive chronic kidney disease with stage 1 through stage 4 chronic kidney disease, or unspecified chronic kidney disease: Secondary | ICD-10-CM | POA: Diagnosis not present

## 2022-11-24 DIAGNOSIS — R269 Unspecified abnormalities of gait and mobility: Secondary | ICD-10-CM | POA: Diagnosis not present

## 2022-11-24 DIAGNOSIS — N182 Chronic kidney disease, stage 2 (mild): Secondary | ICD-10-CM

## 2022-11-24 DIAGNOSIS — G20C Parkinsonism, unspecified: Secondary | ICD-10-CM

## 2022-11-24 DIAGNOSIS — E1122 Type 2 diabetes mellitus with diabetic chronic kidney disease: Secondary | ICD-10-CM

## 2022-11-24 DIAGNOSIS — Z0000 Encounter for general adult medical examination without abnormal findings: Secondary | ICD-10-CM

## 2022-11-24 NOTE — Assessment & Plan Note (Signed)
Chronic, diabetic foot exam was performed.  She is currently taking Xultophy 14 units nightly and metformin 500mg  nightly. Pt advised to take metformin with dinner. I DISCUSSED WITH THE PATIENT AT LENGTH REGARDING THE GOALS OF GLYCEMIC CONTROL AND POSSIBLE LONG-TERM COMPLICATIONS.  I  ALSO STRESSED THE IMPORTANCE OF COMPLIANCE WITH HOME GLUCOSE MONITORING, DIETARY RESTRICTIONS INCLUDING AVOIDANCE OF SUGARY DRINKS/PROCESSED FOODS,  ALONG WITH REGULAR EXERCISE.  I  ALSO STRESSED THE IMPORTANCE OF ANNUAL EYE EXAMS, SELF FOOT CARE AND COMPLIANCE WITH OFFICE VISITS.

## 2022-11-24 NOTE — Assessment & Plan Note (Signed)
Chronic, currently followed by Neurology. She is taking Sinemet one capsule qid. Most recent notes reviewed. She feels her sx have not progressed since her last Neuro visit.

## 2022-11-24 NOTE — Progress Notes (Signed)
I,Victoria T Deloria Lair, CMA,acting as a Neurosurgeon for [name], MD.,have documented all relevant documentation on the behalf of [name], MD,as directed by  [name], MD while in the presence of [name], MD.  Subjective:    Patient ID: April Larson , female    DOB: [birth year] , 72 y.o.   MRN: [id number]  Chief Complaint  Patient presents with   Annual Exam   Hypertension   Diabetes    HPI  The patient is here today for a physical examination. She is followed by GYN in [place]. She can't recall her last visit. She has no specific concerns or complaints at this time. She is accompanied by her sister today. She reports compliance with meds. She denies headaches, chest pain and/or shortness of breath.  Sister reports neurological condition has improved with meds.   She reports having DM eye exam scheduled for Sept.    Hypertension This is a chronic problem. The current episode started more than 1 year ago. The problem is controlled (FAIR CONTROL). Pertinent negatives include no anxiety, blurred vision, chest pain or headaches. The current treatment provides moderate improvement. Compliance problems include exercise.  Hypertensive end-organ damage includes kidney disease.  Diabetes She presents for her follow-up diabetic visit. She has type 2 diabetes mellitus. Her disease course has been stable. There are no hypoglycemic associated symptoms. Pertinent negatives for hypoglycemia include no headaches. Pertinent negatives for diabetes include no blurred vision and no chest pain. There are no hypoglycemic complications. Diabetic complications include nephropathy. Risk factors for coronary artery disease include diabetes mellitus, dyslipidemia, hypertension, post-menopausal and sedentary lifestyle. Current diabetic treatment includes insulin injections. She is compliant with treatment most of the time. Her weight is fluctuating minimally. She is following a diabetic  diet. She participates in exercise intermittently. Her home blood glucose trend is fluctuating minimally. Her breakfast blood glucose is taken between 8-9 am. Her breakfast blood glucose range is generally 70-90 mg/dl. An ACE inhibitor/angiotensin II receptor blocker is being taken.     Past Medical History:  Diagnosis Date   Diabetes mellitus without complication (HCC)    Hypertension    Substance abuse (HCC)      Family History  Problem Relation Age of Onset   Cancer Mother    Diabetes Father    Cancer Brother    Breast cancer Maternal Aunt    Parkinsonism Neg Hx      Current Outpatient Medications:    ACCU-CHEK FASTCLIX LANCETS MISC, USE TO CHECK BLOOD SUGARS TWICE DAILY, Disp: 100 each, Rfl: 11   Alcohol Swabs (B-D SINGLE USE SWABS REGULAR) PADS, USE AS NEEDED., Disp: 100 each, Rfl: 0   aspirin EC 81 MG tablet, Take 1 tablet (81 mg total) by mouth daily., Disp: 90 tablet, Rfl: 1   Biotin 10 MG CAPS, Take 1 tablet by mouth daily., Disp: 90 capsule, Rfl: 1   Blood Glucose Monitoring Suppl (TRUE METRIX AIR GLUCOSE METER) DEVI, USE AS DIRECTED TO CHECK BLOOD SUGARS., Disp: 1 each, Rfl: 0   calcium carbonate (OSCAL) 1500 (600 Ca) MG TABS tablet, Take 1 tablet (1,500 mg total) by mouth daily with breakfast., Disp: 90 tablet, Rfl: 1   Cholecalciferol 125 MCG (5000 UT) TABS, Take 1 tablet (5,000 Units total) by mouth daily., Disp: 90 tablet, Rfl: 1   clopidogrel (PLAVIX) 75 MG tablet, Take 75 mg by mouth daily., Disp: , Rfl:    diclofenac Sodium (VOLTAREN) 1 % GEL, APPLY 2 GRAMS  TOPICALLY TO THE AFFECTED AREA FOUR TIMES DAILY, Disp: 100 g, Rfl: 2   fluticasone (FLONASE) 50 MCG/ACT nasal spray, Place 2 sprays into both nostrils daily., Disp: 16 g, Rfl: 2   glucose blood (TRUE METRIX BLOOD GLUCOSE TEST) test strip, Use as instructed, Disp: 100 each, Rfl: 12   Insulin Degludec-Liraglutide (XULTOPHY) 100-3.6 UNIT-MG/ML SOPN, INJECT 14 UNITS SUBCUTANEOUSLY DAILY., Disp: 15 mL, Rfl: 1    Insulin Pen Needle (B-D ULTRAFINE III SHORT PEN) 31G X 8 MM MISC, Use as directed, Disp: 100 each, Rfl: 11   loratadine (CLARITIN) 10 MG tablet, Take 1 tablet by mouth daily, Disp: 90 tablet, Rfl: 1   metFORMIN (GLUCOPHAGE) 500 MG tablet, TAKE ONE TABLET BY MOUTH EVERYDAY AT BEDTIME, Disp: 180 tablet, Rfl: 1   Multiple Vitamins-Minerals (ONE-A-DAY WOMENS 50+ ADVANTAGE) TABS, Take 1 tablet by mouth daily, Disp: 90 tablet, Rfl: 1   Omega-3 Fatty Acids (FISH OIL) 1000 MG CAPS, Take 1 capsule by mouth daily, Disp: 90 capsule, Rfl: 1   omeprazole (PRILOSEC) 40 MG capsule, Take 1 capsule (40 mg total) by mouth daily before breakfast., Disp: 90 capsule, Rfl: 2   rivastigmine (EXELON) 1.5 MG capsule, Take 1 capsule (1.5 mg total) by mouth 2 (two) times daily., Disp: 60 capsule, Rfl: 5   telmisartan-hydrochlorothiazide (MICARDIS HCT) 40-12.5 MG tablet, TAKE ONE TABLET BY MOUTH EVERY MORNING, Disp: 90 tablet, Rfl: 1   TRUEplus Lancets 28G MISC, USE AS DIRECTED TO CHECK BLOOD SUGARS., Disp: 100 each, Rfl: 1   vitamin C (ASCORBIC ACID) 500 MG tablet, Take 1 tablet (500 mg total) by mouth daily., Disp: 90 tablet, Rfl: 1   atorvastatin (LIPITOR) 10 MG tablet, Take 1 tablet (10 mg total) by mouth daily., Disp: 90 tablet, Rfl: 2   carbidopa-levodopa (SINEMET IR) 25-100 MG tablet, Take 1 tablet by mouth 4 (four) times daily (1 tablet before breakfast, 1 tablet at noon, 1 tablet in the evening, and 1 tablet at bedtime)., Disp: 90 tablet, Rfl: 2   fluticasone (FLONASE) 50 MCG/ACT nasal spray, Place 2 sprays into both nostrils daily as directed., Disp: 16 g, Rfl: 2   metFORMIN (GLUCOPHAGE) 500 MG tablet, Take 1 tablet (500 mg total) by mouth at bedtime., Disp: 180 tablet, Rfl: 1   NON FORMULARY, 2 each. Calm (Patient not taking: Reported on 11/24/2022), Disp: , Rfl:    omeprazole (PRILOSEC) 40 MG capsule, Take 1 capsule (40 mg total) by mouth daily before breakfast., Disp: 90 capsule, Rfl: 2    telmisartan-hydrochlorothiazide (MICARDIS HCT) 40-12.5 MG tablet, Take 1 tablet by mouth every morning., Disp: 90 tablet, Rfl: 1   Turmeric 500 MG CAPS, Take 1 capsule by mouth 2 (two) times daily., Disp: 180 capsule, Rfl: 1   No Known Allergies    The patient states she uses post menopausal status for birth control. No LMP recorded. Patient is postmenopausal.. Negative for Dysmenorrhea. Negative for: breast discharge, breast lump(s), breast pain and breast self exam. Associated symptoms include abnormal vaginal bleeding. Pertinent negatives include abnormal bleeding (hematology), anxiety, decreased libido, depression, difficulty falling sleep, dyspareunia, history of infertility, nocturia, sexual dysfunction, sleep disturbances, urinary incontinence, urinary urgency, vaginal discharge and vaginal itching. Diet regular.The patient states her exercise level is  intermittent.  . The patient's tobacco use is:  Social History   Tobacco Use  Smoking Status Former   Current packs/day: 0.00   Average packs/day: 1 pack/day for 37.0 years (37.0 ttl pk-yrs)   Types: Cigarettes   Start date: 64  Quit date: 04/08/2005   Years since quitting: 17.6  Smokeless Tobacco Never  . She has been exposed to passive smoke. The patient's alcohol use is:  Social History   Substance and Sexual Activity  Alcohol Use No   Alcohol/week: 0.0 standard drinks of alcohol    Review of Systems  Constitutional: Negative.   HENT: Negative.    Eyes: Negative.  Negative for blurred vision.  Respiratory: Negative.    Cardiovascular: Negative.  Negative for chest pain.  Gastrointestinal: Negative.   Endocrine: Negative.   Genitourinary: Negative.   Musculoskeletal: Negative.   Skin: Negative.   Allergic/Immunologic: Negative.   Neurological: Negative.  Negative for headaches.  Hematological: Negative.   Psychiatric/Behavioral: Negative.       Today's Vitals   11/24/22 0900  BP: 130/70  Pulse: 79  Temp: 98 F  (36.7 C)  SpO2: 98%  Weight: 141 lb 12.8 oz (64.3 kg)  Height: 5\' 1"  (1.549 m)   Body mass index is 26.79 kg/m.  Wt Readings from Last 3 Encounters:  11/24/22 141 lb 12.8 oz (64.3 kg)  09/13/22 144 lb 6.4 oz (65.5 kg)  08/04/22 134 lb (60.8 kg)     Objective:  Physical Exam Vitals and nursing note reviewed.  Constitutional:      Appearance: Normal appearance.  HENT:     Head: Normocephalic and atraumatic.     Right Ear: Tympanic membrane, ear canal and external ear normal.     Left Ear: Tympanic membrane, ear canal and external ear normal.     Nose: Nose normal.     Mouth/Throat:     Mouth: Mucous membranes are moist.     Pharynx: Oropharynx is clear.  Eyes:     Extraocular Movements: Extraocular movements intact.     Conjunctiva/sclera: Conjunctivae normal.     Pupils: Pupils are equal, round, and reactive to light.  Cardiovascular:     Rate and Rhythm: Normal rate and regular rhythm.     Pulses:          Dorsalis pedis pulses are 1+ on the right side and 1+ on the left side.     Heart sounds: Normal heart sounds.  Pulmonary:     Effort: Pulmonary effort is normal.     Breath sounds: Normal breath sounds.  Abdominal:     General: Abdomen is flat. Bowel sounds are normal.     Palpations: Abdomen is soft.  Genitourinary:    Comments: deferred Musculoskeletal:        General: Normal range of motion.     Cervical back: Normal range of motion and neck supple.  Feet:     Right foot:     Protective Sensation: 5 sites tested.  5 sites sensed.     Skin integrity: Dry skin present.     Toenail Condition: Right toenails are long.     Left foot:     Protective Sensation: 5 sites tested.  5 sites sensed.     Skin integrity: Dry skin present.     Toenail Condition: Left toenails are long.  Skin:    General: Skin is warm and dry.  Neurological:     Mental Status: She is alert and oriented to person, place, and time.     Gait: Gait abnormal.     Comments: Bradykinesia  noted. Ambulatory with cane. Abnormal gait.   Psychiatric:        Mood and Affect: Mood normal.        Behavior: Behavior  normal.         Assessment And Plan:     Encounter for annual physical exam Assessment & Plan: A full exam was performed.  Importance of monthly self breast exams was discussed with the patient.  She is advised to get 3045 minutes of regular exercise, no less than four to five days per week. Both weight-bearing and aerobic exercises are recommended.  She is advised to follow a healthy diet with at least six fruits/veggies per day, decrease intake of red meat and other saturated fats and to increase fish intake to twice weekly.  Meats/fish should not be fried -- baked, boiled or broiled is preferable. It is also important to cut back on your sugar intake.  Be sure to read labels - try to avoid anything with added sugar, high fructose corn syrup or other sweeteners.  If you must use a sweetener, you can try stevia or monkfruit.  It is also important to avoid artificially sweetened foods/beverages and diet drinks. Lastly, wear SPF 50 sunscreen on exposed skin and when in direct sunlight for an extended period of time.  Be sure to avoid fast food restaurants and aim for at least 60 ounces of water daily.       Hypertensive nephropathy Assessment & Plan: Chronic, fair control. EKG performed, NSR w/o acute changes. She will continue with telmisartan/hct 40/12.5mg  daily. She is encouraged to follow low sodium diet.   Orders: -     CMP14+EGFR -     Lipid panel -     POCT urinalysis dipstick -     Microalbumin / creatinine urine ratio -     EKG 12-Lead  Diabetes mellitus with stage 2 chronic kidney disease (HCC) Assessment & Plan: Chronic, diabetic foot exam was performed.  She is currently taking Xultophy 14 units nightly and metformin 500mg  nightly. Pt advised to take metformin with dinner. I DISCUSSED WITH THE PATIENT AT LENGTH REGARDING THE GOALS OF GLYCEMIC CONTROL AND  POSSIBLE LONG-TERM COMPLICATIONS.  I  ALSO STRESSED THE IMPORTANCE OF COMPLIANCE WITH HOME GLUCOSE MONITORING, DIETARY RESTRICTIONS INCLUDING AVOIDANCE OF SUGARY DRINKS/PROCESSED FOODS,  ALONG WITH REGULAR EXERCISE.  I  ALSO STRESSED THE IMPORTANCE OF ANNUAL EYE EXAMS, SELF FOOT CARE AND COMPLIANCE WITH OFFICE VISITS.   Orders: -     CMP14+EGFR -     Lipid panel -     Hemoglobin A1c -     POCT urinalysis dipstick -     Microalbumin / creatinine urine ratio -     EKG 12-Lead  Parkinsonism, unspecified Parkinsonism type Assessment & Plan: Chronic, currently followed by Neurology. She is taking Sinemet one capsule qid. Most recent notes reviewed. She feels her sx have not progressed since her last Neuro visit.   Orders: -     Ambulatory referral to Home Health  Thrombocytosis -     CBC  Gait abnormality Assessment & Plan: Pt and her sister are interested in physical therapy. I will refer her for home therapy. Pt is encouraged to perform exercises daily.   Orders: -     Ambulatory referral to Home Health  Other orders -     Atorvastatin Calcium; Take 1 tablet (10 mg total) by mouth daily.  Dispense: 90 tablet; Refill: 2     Return for 1 year HM, 4 month dm. Patient was given opportunity to ask questions. Patient verbalized understanding of the plan and was able to repeat key elements of the plan. All questions were answered to their  satisfaction.    I, [name], MD, have reviewed all documentation for this visit. The documentation on 11/24/22 for the exam, diagnosis, procedures, and orders are all accurate and complete.

## 2022-11-24 NOTE — Assessment & Plan Note (Signed)

## 2022-11-24 NOTE — Assessment & Plan Note (Signed)
Chronic, fair control. EKG performed, NSR w/o acute changes. She will continue with telmisartan/hct 40/12.5mg  daily. She is encouraged to follow low sodium diet.

## 2022-11-25 LAB — MICROALBUMIN / CREATININE URINE RATIO
Creatinine, Urine: 133.2 mg/dL
Microalb/Creat Ratio: 8 mg/g{creat} (ref 0–29)
Microalbumin, Urine: 10.9 ug/mL

## 2022-11-25 LAB — CMP14+EGFR
ALT: 6 IU/L (ref 0–32)
AST: 16 IU/L (ref 0–40)
Albumin: 4.4 g/dL (ref 3.9–4.9)
Alkaline Phosphatase: 125 IU/L — ABNORMAL HIGH (ref 44–121)
BUN/Creatinine Ratio: 23 (ref 12–28)
BUN: 18 mg/dL (ref 8–27)
Bilirubin Total: 0.2 mg/dL (ref 0.0–1.2)
CO2: 26 mmol/L (ref 20–29)
Calcium: 9.8 mg/dL (ref 8.7–10.3)
Chloride: 104 mmol/L (ref 96–106)
Creatinine, Ser: 0.79 mg/dL (ref 0.57–1.00)
Globulin, Total: 2.7 g/dL (ref 1.5–4.5)
Glucose: 40 mg/dL — ABNORMAL LOW (ref 70–99)
Potassium: 4.3 mmol/L (ref 3.5–5.2)
Sodium: 143 mmol/L (ref 134–144)
Total Protein: 7.1 g/dL (ref 6.0–8.5)
eGFR: 80 mL/min/{1.73_m2} (ref >59)

## 2022-11-25 LAB — CBC
Hematocrit: 36.2 % (ref 34.0–46.6)
Hemoglobin: 12.1 g/dL (ref 11.1–15.9)
MCH: 28.1 pg (ref 26.6–33.0)
MCHC: 33.4 g/dL (ref 31.5–35.7)
MCV: 84 fL (ref 79–97)
Platelets: 346 10*3/uL (ref 150–450)
RBC: 4.31 x10E6/uL (ref 3.77–5.28)
RDW: 14 % (ref 11.7–15.4)
WBC: 6.5 10*3/uL (ref 3.4–10.8)

## 2022-11-25 LAB — HEMOGLOBIN A1C
Est. average glucose Bld gHb Est-mCnc: 140 mg/dL
Hgb A1c MFr Bld: 6.5 % — ABNORMAL HIGH (ref 4.8–5.6)

## 2022-11-25 LAB — LIPID PANEL
Chol/HDL Ratio: 2.2 ratio (ref 0.0–4.4)
Cholesterol, Total: 155 mg/dL (ref 100–199)
HDL: 71 mg/dL (ref >39)
LDL Chol Calc (NIH): 73 mg/dL (ref 0–99)
Triglycerides: 54 mg/dL (ref 0–149)
VLDL Cholesterol Cal: 11 mg/dL (ref 5–40)

## 2022-12-01 ENCOUNTER — Other Ambulatory Visit (HOSPITAL_COMMUNITY): Payer: Self-pay

## 2022-12-01 MED ORDER — ATORVASTATIN CALCIUM 10 MG PO TABS: 10.0000 mg | ORAL_TABLET | Freq: Every day | ORAL | 1 refills | Status: DC

## 2022-12-01 MED ORDER — METFORMIN HCL 500 MG PO TABS
500.0000 mg | ORAL_TABLET | Freq: Every day | ORAL | 1 refills | Status: DC
  Filled 2022-12-09 (×2): qty 90, 90d supply, fill #0
  Filled 2023-02-22: qty 30, 30d supply, fill #1

## 2022-12-02 ENCOUNTER — Other Ambulatory Visit (HOSPITAL_COMMUNITY): Payer: Self-pay

## 2022-12-02 MED ORDER — TELMISARTAN-HCTZ 40-12.5 MG PO TABS
1.0000 | ORAL_TABLET | Freq: Every morning | ORAL | 1 refills | Status: DC
  Filled 2022-12-09 (×2): qty 90, 90d supply, fill #0
  Filled 2022-12-30: qty 90, 90d supply, fill #1
  Filled 2023-02-22: qty 30, 30d supply, fill #1

## 2022-12-02 MED ORDER — ATORVASTATIN CALCIUM 10 MG PO TABS: 10.0000 mg | ORAL_TABLET | Freq: Every day | ORAL | 1 refills | Status: DC

## 2022-12-02 MED ORDER — OMEPRAZOLE 40 MG PO CPDR
40.0000 mg | DELAYED_RELEASE_CAPSULE | Freq: Every day | ORAL | 2 refills | Status: DC
  Filled 2022-12-09 (×2): qty 90, 90d supply, fill #0
  Filled 2023-02-22: qty 30, 30d supply, fill #1
  Filled 2023-03-24 – 2023-03-31 (×4): qty 30, 30d supply, fill #2
  Filled 2023-04-08 – 2023-04-26 (×3): qty 30, 30d supply, fill #3
  Filled 2023-05-16 – 2023-05-19 (×2): qty 30, 30d supply, fill #4
  Filled 2023-06-06 – 2023-06-14 (×2): qty 30, 30d supply, fill #5
  Filled 2023-06-23 – 2023-07-07 (×2): qty 30, 30d supply, fill #6

## 2022-12-02 MED ORDER — CARBIDOPA-LEVODOPA 25-100 MG PO TABS
1.0000 | ORAL_TABLET | Freq: Four times a day (QID) | ORAL | 2 refills | Status: DC
  Filled 2022-12-09 (×3): qty 90, 23d supply, fill #0

## 2022-12-02 MED ORDER — FLUTICASONE PROPIONATE 50 MCG/ACT NA SUSP
2.0000 | Freq: Every day | NASAL | 2 refills | Status: DC
  Filled 2022-12-09 (×2): qty 16, 30d supply, fill #0

## 2022-12-02 MED ORDER — BIOTIN 10000 MCG PO TABS: 1.0000 | ORAL_TABLET | Freq: Every morning | ORAL | 99 refills | Status: DC

## 2022-12-03 ENCOUNTER — Other Ambulatory Visit (HOSPITAL_COMMUNITY): Payer: Self-pay

## 2022-12-06 ENCOUNTER — Other Ambulatory Visit (HOSPITAL_COMMUNITY): Payer: Self-pay

## 2022-12-06 DIAGNOSIS — R269 Unspecified abnormalities of gait and mobility: Secondary | ICD-10-CM | POA: Insufficient documentation

## 2022-12-06 DIAGNOSIS — D75839 Thrombocytosis, unspecified: Secondary | ICD-10-CM | POA: Insufficient documentation

## 2022-12-06 MED ORDER — ATORVASTATIN CALCIUM 10 MG PO TABS
10.0000 mg | ORAL_TABLET | Freq: Every day | ORAL | 2 refills | Status: DC
  Filled 2022-12-06 – 2022-12-09 (×3): qty 90, 90d supply, fill #0
  Filled 2023-01-11: qty 60, 60d supply, fill #0
  Filled 2023-01-11: qty 90, 90d supply, fill #0

## 2022-12-06 NOTE — Assessment & Plan Note (Signed)
Pt and her sister are interested in physical therapy. I will refer her for home therapy. Pt is encouraged to perform exercises daily.

## 2022-12-07 ENCOUNTER — Other Ambulatory Visit (HOSPITAL_COMMUNITY): Payer: Self-pay

## 2022-12-07 ENCOUNTER — Other Ambulatory Visit: Payer: Self-pay

## 2022-12-08 ENCOUNTER — Other Ambulatory Visit (HOSPITAL_COMMUNITY): Payer: Self-pay

## 2022-12-08 ENCOUNTER — Other Ambulatory Visit: Payer: Self-pay

## 2022-12-09 ENCOUNTER — Other Ambulatory Visit: Payer: Self-pay | Admitting: Internal Medicine

## 2022-12-09 ENCOUNTER — Other Ambulatory Visit (HOSPITAL_COMMUNITY): Payer: Self-pay

## 2022-12-09 ENCOUNTER — Other Ambulatory Visit: Payer: Self-pay

## 2022-12-09 MED ORDER — TURMERIC 500 MG PO CAPS
1.0000 | ORAL_CAPSULE | Freq: Two times a day (BID) | ORAL | 1 refills | Status: DC
Start: 2022-12-09 — End: 2022-12-13
  Filled 2022-12-09: qty 180, 90d supply, fill #0

## 2022-12-10 ENCOUNTER — Other Ambulatory Visit (HOSPITAL_COMMUNITY): Payer: Self-pay

## 2022-12-10 ENCOUNTER — Other Ambulatory Visit: Payer: Self-pay

## 2022-12-11 ENCOUNTER — Other Ambulatory Visit: Payer: Self-pay

## 2022-12-11 ENCOUNTER — Other Ambulatory Visit (HOSPITAL_COMMUNITY): Payer: Self-pay

## 2022-12-13 ENCOUNTER — Other Ambulatory Visit: Payer: Self-pay

## 2022-12-13 MED ORDER — BIOTIN 10 MG PO CAPS
1.0000 | ORAL_CAPSULE | Freq: Every day | ORAL | 1 refills | Status: AC
  Filled 2022-12-13: qty 90, fill #0

## 2022-12-13 MED ORDER — TURMERIC 500 MG PO CAPS
1.0000 | ORAL_CAPSULE | Freq: Two times a day (BID) | ORAL | 1 refills | Status: AC
Start: 2022-12-13 — End: ?
  Filled 2022-12-13: qty 180, fill #0
  Filled 2022-12-25: qty 180, 90d supply, fill #0

## 2022-12-14 ENCOUNTER — Other Ambulatory Visit (HOSPITAL_COMMUNITY): Payer: Self-pay

## 2022-12-15 ENCOUNTER — Other Ambulatory Visit (HOSPITAL_COMMUNITY): Payer: Self-pay

## 2022-12-15 ENCOUNTER — Ambulatory Visit: Payer: Medicare PPO | Admitting: Neurology

## 2022-12-15 DIAGNOSIS — R634 Abnormal weight loss: Secondary | ICD-10-CM

## 2022-12-15 DIAGNOSIS — G4733 Obstructive sleep apnea (adult) (pediatric): Secondary | ICD-10-CM

## 2022-12-16 DIAGNOSIS — N182 Chronic kidney disease, stage 2 (mild): Secondary | ICD-10-CM | POA: Diagnosis not present

## 2022-12-16 DIAGNOSIS — E1122 Type 2 diabetes mellitus with diabetic chronic kidney disease: Secondary | ICD-10-CM | POA: Diagnosis not present

## 2022-12-16 DIAGNOSIS — E7800 Pure hypercholesterolemia, unspecified: Secondary | ICD-10-CM | POA: Diagnosis not present

## 2022-12-16 DIAGNOSIS — I129 Hypertensive chronic kidney disease with stage 1 through stage 4 chronic kidney disease, or unspecified chronic kidney disease: Secondary | ICD-10-CM | POA: Diagnosis not present

## 2022-12-16 DIAGNOSIS — G20C Parkinsonism, unspecified: Secondary | ICD-10-CM | POA: Diagnosis not present

## 2022-12-16 DIAGNOSIS — Z7902 Long term (current) use of antithrombotics/antiplatelets: Secondary | ICD-10-CM | POA: Diagnosis not present

## 2022-12-16 DIAGNOSIS — D75839 Thrombocytosis, unspecified: Secondary | ICD-10-CM | POA: Diagnosis not present

## 2022-12-16 DIAGNOSIS — Z7984 Long term (current) use of oral hypoglycemic drugs: Secondary | ICD-10-CM | POA: Diagnosis not present

## 2022-12-16 DIAGNOSIS — Z7952 Long term (current) use of systemic steroids: Secondary | ICD-10-CM | POA: Diagnosis not present

## 2022-12-20 ENCOUNTER — Other Ambulatory Visit (HOSPITAL_COMMUNITY): Payer: Self-pay

## 2022-12-21 DIAGNOSIS — D75839 Thrombocytosis, unspecified: Secondary | ICD-10-CM | POA: Diagnosis not present

## 2022-12-21 DIAGNOSIS — Z7984 Long term (current) use of oral hypoglycemic drugs: Secondary | ICD-10-CM | POA: Diagnosis not present

## 2022-12-21 DIAGNOSIS — G20C Parkinsonism, unspecified: Secondary | ICD-10-CM | POA: Diagnosis not present

## 2022-12-21 DIAGNOSIS — I129 Hypertensive chronic kidney disease with stage 1 through stage 4 chronic kidney disease, or unspecified chronic kidney disease: Secondary | ICD-10-CM | POA: Diagnosis not present

## 2022-12-21 DIAGNOSIS — E7800 Pure hypercholesterolemia, unspecified: Secondary | ICD-10-CM | POA: Diagnosis not present

## 2022-12-21 DIAGNOSIS — N182 Chronic kidney disease, stage 2 (mild): Secondary | ICD-10-CM | POA: Diagnosis not present

## 2022-12-21 DIAGNOSIS — Z7902 Long term (current) use of antithrombotics/antiplatelets: Secondary | ICD-10-CM | POA: Diagnosis not present

## 2022-12-21 DIAGNOSIS — Z7952 Long term (current) use of systemic steroids: Secondary | ICD-10-CM | POA: Diagnosis not present

## 2022-12-21 DIAGNOSIS — E1122 Type 2 diabetes mellitus with diabetic chronic kidney disease: Secondary | ICD-10-CM | POA: Diagnosis not present

## 2022-12-23 DIAGNOSIS — Z7902 Long term (current) use of antithrombotics/antiplatelets: Secondary | ICD-10-CM | POA: Diagnosis not present

## 2022-12-23 DIAGNOSIS — Z7952 Long term (current) use of systemic steroids: Secondary | ICD-10-CM | POA: Diagnosis not present

## 2022-12-23 DIAGNOSIS — Z7984 Long term (current) use of oral hypoglycemic drugs: Secondary | ICD-10-CM | POA: Diagnosis not present

## 2022-12-23 DIAGNOSIS — E7800 Pure hypercholesterolemia, unspecified: Secondary | ICD-10-CM | POA: Diagnosis not present

## 2022-12-23 DIAGNOSIS — I129 Hypertensive chronic kidney disease with stage 1 through stage 4 chronic kidney disease, or unspecified chronic kidney disease: Secondary | ICD-10-CM | POA: Diagnosis not present

## 2022-12-23 DIAGNOSIS — G20C Parkinsonism, unspecified: Secondary | ICD-10-CM | POA: Diagnosis not present

## 2022-12-23 DIAGNOSIS — E1122 Type 2 diabetes mellitus with diabetic chronic kidney disease: Secondary | ICD-10-CM | POA: Diagnosis not present

## 2022-12-23 DIAGNOSIS — D75839 Thrombocytosis, unspecified: Secondary | ICD-10-CM | POA: Diagnosis not present

## 2022-12-23 DIAGNOSIS — N182 Chronic kidney disease, stage 2 (mild): Secondary | ICD-10-CM | POA: Diagnosis not present

## 2022-12-26 ENCOUNTER — Other Ambulatory Visit (INDEPENDENT_AMBULATORY_CARE_PROVIDER_SITE_OTHER): Payer: Medicare PPO | Admitting: Internal Medicine

## 2022-12-26 DIAGNOSIS — E1122 Type 2 diabetes mellitus with diabetic chronic kidney disease: Secondary | ICD-10-CM

## 2022-12-26 DIAGNOSIS — E7800 Pure hypercholesterolemia, unspecified: Secondary | ICD-10-CM

## 2022-12-26 DIAGNOSIS — I129 Hypertensive chronic kidney disease with stage 1 through stage 4 chronic kidney disease, or unspecified chronic kidney disease: Secondary | ICD-10-CM | POA: Diagnosis not present

## 2022-12-26 DIAGNOSIS — N182 Chronic kidney disease, stage 2 (mild): Secondary | ICD-10-CM

## 2022-12-26 DIAGNOSIS — D75839 Thrombocytosis, unspecified: Secondary | ICD-10-CM

## 2022-12-26 DIAGNOSIS — R269 Unspecified abnormalities of gait and mobility: Secondary | ICD-10-CM | POA: Diagnosis not present

## 2022-12-26 DIAGNOSIS — Z9181 History of falling: Secondary | ICD-10-CM

## 2022-12-26 DIAGNOSIS — G20A1 Parkinson's disease without dyskinesia, without mention of fluctuations: Secondary | ICD-10-CM | POA: Diagnosis not present

## 2022-12-26 NOTE — Progress Notes (Signed)
Cc: HH certification    Received home health orders orders from [facility]. Start of care 12/16/22.   Certification and orders from 12/16/22 through 02/13/23 are reviewed, signed and faxed back to home health company.  Need of intermittent skilled services at home: PT/OT  The home health care plan has been established by me and will be reviewed and updated as needed to maximize patient recovery.  I certify that all home health services have been and will be furnished to the patient while under my care.  Face-to-face encounter in which the need for home health services was established: 11/24/22  Patient is receiving home health services for the following diagnoses: Problem List Items Addressed This Visit       Cardiovascular and Mediastinum   Benign hypertension with chronic kidney disease, stage II     Endocrine   Diabetes mellitus with stage 2 chronic kidney disease (HCC) - Primary     Nervous and Auditory   Parkinsonism     Hematopoietic and Hemostatic   Thrombocytosis     Other   Pure hypercholesterolemia   Gait abnormality   Other Visit Diagnoses     History of falling [Z91.81]            [name], MD

## 2022-12-27 ENCOUNTER — Other Ambulatory Visit (HOSPITAL_COMMUNITY): Payer: Self-pay

## 2022-12-27 ENCOUNTER — Other Ambulatory Visit: Payer: Self-pay

## 2022-12-27 DIAGNOSIS — G20C Parkinsonism, unspecified: Secondary | ICD-10-CM | POA: Diagnosis not present

## 2022-12-27 DIAGNOSIS — N182 Chronic kidney disease, stage 2 (mild): Secondary | ICD-10-CM | POA: Diagnosis not present

## 2022-12-27 DIAGNOSIS — D75839 Thrombocytosis, unspecified: Secondary | ICD-10-CM | POA: Diagnosis not present

## 2022-12-27 DIAGNOSIS — E1122 Type 2 diabetes mellitus with diabetic chronic kidney disease: Secondary | ICD-10-CM | POA: Diagnosis not present

## 2022-12-27 DIAGNOSIS — I129 Hypertensive chronic kidney disease with stage 1 through stage 4 chronic kidney disease, or unspecified chronic kidney disease: Secondary | ICD-10-CM | POA: Diagnosis not present

## 2022-12-27 DIAGNOSIS — Z7952 Long term (current) use of systemic steroids: Secondary | ICD-10-CM | POA: Diagnosis not present

## 2022-12-27 DIAGNOSIS — Z7902 Long term (current) use of antithrombotics/antiplatelets: Secondary | ICD-10-CM | POA: Diagnosis not present

## 2022-12-27 DIAGNOSIS — Z7984 Long term (current) use of oral hypoglycemic drugs: Secondary | ICD-10-CM | POA: Diagnosis not present

## 2022-12-27 DIAGNOSIS — E7800 Pure hypercholesterolemia, unspecified: Secondary | ICD-10-CM | POA: Diagnosis not present

## 2022-12-28 DIAGNOSIS — Z7902 Long term (current) use of antithrombotics/antiplatelets: Secondary | ICD-10-CM | POA: Diagnosis not present

## 2022-12-28 DIAGNOSIS — G20C Parkinsonism, unspecified: Secondary | ICD-10-CM | POA: Diagnosis not present

## 2022-12-28 DIAGNOSIS — N182 Chronic kidney disease, stage 2 (mild): Secondary | ICD-10-CM | POA: Diagnosis not present

## 2022-12-28 DIAGNOSIS — Z7952 Long term (current) use of systemic steroids: Secondary | ICD-10-CM | POA: Diagnosis not present

## 2022-12-28 DIAGNOSIS — E7800 Pure hypercholesterolemia, unspecified: Secondary | ICD-10-CM | POA: Diagnosis not present

## 2022-12-28 DIAGNOSIS — E1122 Type 2 diabetes mellitus with diabetic chronic kidney disease: Secondary | ICD-10-CM | POA: Diagnosis not present

## 2022-12-28 DIAGNOSIS — Z7984 Long term (current) use of oral hypoglycemic drugs: Secondary | ICD-10-CM | POA: Diagnosis not present

## 2022-12-28 DIAGNOSIS — I129 Hypertensive chronic kidney disease with stage 1 through stage 4 chronic kidney disease, or unspecified chronic kidney disease: Secondary | ICD-10-CM | POA: Diagnosis not present

## 2022-12-28 DIAGNOSIS — D75839 Thrombocytosis, unspecified: Secondary | ICD-10-CM | POA: Diagnosis not present

## 2022-12-29 DIAGNOSIS — G231 Progressive supranuclear ophthalmoplegia [Steele-Richardson-Olszewski]: Secondary | ICD-10-CM | POA: Diagnosis not present

## 2022-12-29 DIAGNOSIS — Z961 Presence of intraocular lens: Secondary | ICD-10-CM | POA: Diagnosis not present

## 2022-12-29 DIAGNOSIS — H04123 Dry eye syndrome of bilateral lacrimal glands: Secondary | ICD-10-CM | POA: Diagnosis not present

## 2022-12-30 ENCOUNTER — Other Ambulatory Visit: Payer: Self-pay | Admitting: Internal Medicine

## 2022-12-30 ENCOUNTER — Other Ambulatory Visit: Payer: Self-pay

## 2022-12-30 DIAGNOSIS — Z7952 Long term (current) use of systemic steroids: Secondary | ICD-10-CM | POA: Diagnosis not present

## 2022-12-30 DIAGNOSIS — N182 Chronic kidney disease, stage 2 (mild): Secondary | ICD-10-CM | POA: Diagnosis not present

## 2022-12-30 DIAGNOSIS — G20C Parkinsonism, unspecified: Secondary | ICD-10-CM | POA: Diagnosis not present

## 2022-12-30 DIAGNOSIS — E7800 Pure hypercholesterolemia, unspecified: Secondary | ICD-10-CM | POA: Diagnosis not present

## 2022-12-30 DIAGNOSIS — I129 Hypertensive chronic kidney disease with stage 1 through stage 4 chronic kidney disease, or unspecified chronic kidney disease: Secondary | ICD-10-CM | POA: Diagnosis not present

## 2022-12-30 DIAGNOSIS — Z7984 Long term (current) use of oral hypoglycemic drugs: Secondary | ICD-10-CM | POA: Diagnosis not present

## 2022-12-30 DIAGNOSIS — E1122 Type 2 diabetes mellitus with diabetic chronic kidney disease: Secondary | ICD-10-CM | POA: Diagnosis not present

## 2022-12-30 DIAGNOSIS — D75839 Thrombocytosis, unspecified: Secondary | ICD-10-CM | POA: Diagnosis not present

## 2022-12-30 DIAGNOSIS — Z7902 Long term (current) use of antithrombotics/antiplatelets: Secondary | ICD-10-CM | POA: Diagnosis not present

## 2022-12-31 DIAGNOSIS — Z7902 Long term (current) use of antithrombotics/antiplatelets: Secondary | ICD-10-CM | POA: Diagnosis not present

## 2022-12-31 DIAGNOSIS — E1122 Type 2 diabetes mellitus with diabetic chronic kidney disease: Secondary | ICD-10-CM | POA: Diagnosis not present

## 2022-12-31 DIAGNOSIS — D75839 Thrombocytosis, unspecified: Secondary | ICD-10-CM | POA: Diagnosis not present

## 2022-12-31 DIAGNOSIS — Z7984 Long term (current) use of oral hypoglycemic drugs: Secondary | ICD-10-CM | POA: Diagnosis not present

## 2022-12-31 DIAGNOSIS — I129 Hypertensive chronic kidney disease with stage 1 through stage 4 chronic kidney disease, or unspecified chronic kidney disease: Secondary | ICD-10-CM | POA: Diagnosis not present

## 2022-12-31 DIAGNOSIS — G20C Parkinsonism, unspecified: Secondary | ICD-10-CM | POA: Diagnosis not present

## 2022-12-31 DIAGNOSIS — Z7952 Long term (current) use of systemic steroids: Secondary | ICD-10-CM | POA: Diagnosis not present

## 2022-12-31 DIAGNOSIS — N182 Chronic kidney disease, stage 2 (mild): Secondary | ICD-10-CM | POA: Diagnosis not present

## 2022-12-31 DIAGNOSIS — E7800 Pure hypercholesterolemia, unspecified: Secondary | ICD-10-CM | POA: Diagnosis not present

## 2023-01-04 ENCOUNTER — Ambulatory Visit (INDEPENDENT_AMBULATORY_CARE_PROVIDER_SITE_OTHER): Payer: Medicare PPO | Admitting: Neurology

## 2023-01-04 DIAGNOSIS — Z7984 Long term (current) use of oral hypoglycemic drugs: Secondary | ICD-10-CM | POA: Diagnosis not present

## 2023-01-04 DIAGNOSIS — E7800 Pure hypercholesterolemia, unspecified: Secondary | ICD-10-CM | POA: Diagnosis not present

## 2023-01-04 DIAGNOSIS — G20C Parkinsonism, unspecified: Secondary | ICD-10-CM | POA: Diagnosis not present

## 2023-01-04 DIAGNOSIS — E1122 Type 2 diabetes mellitus with diabetic chronic kidney disease: Secondary | ICD-10-CM | POA: Diagnosis not present

## 2023-01-04 DIAGNOSIS — R634 Abnormal weight loss: Secondary | ICD-10-CM

## 2023-01-04 DIAGNOSIS — Z7952 Long term (current) use of systemic steroids: Secondary | ICD-10-CM | POA: Diagnosis not present

## 2023-01-04 DIAGNOSIS — N182 Chronic kidney disease, stage 2 (mild): Secondary | ICD-10-CM | POA: Diagnosis not present

## 2023-01-04 DIAGNOSIS — G4733 Obstructive sleep apnea (adult) (pediatric): Secondary | ICD-10-CM

## 2023-01-04 DIAGNOSIS — D75839 Thrombocytosis, unspecified: Secondary | ICD-10-CM | POA: Diagnosis not present

## 2023-01-04 DIAGNOSIS — I129 Hypertensive chronic kidney disease with stage 1 through stage 4 chronic kidney disease, or unspecified chronic kidney disease: Secondary | ICD-10-CM | POA: Diagnosis not present

## 2023-01-04 DIAGNOSIS — Z7902 Long term (current) use of antithrombotics/antiplatelets: Secondary | ICD-10-CM | POA: Diagnosis not present

## 2023-01-05 ENCOUNTER — Other Ambulatory Visit: Payer: Self-pay

## 2023-01-05 ENCOUNTER — Other Ambulatory Visit (HOSPITAL_COMMUNITY): Payer: Self-pay

## 2023-01-05 MED FILL — Carbidopa & Levodopa Tab 25-100 MG: ORAL | 23 days supply | Qty: 90 | Fill #0 | Status: CN

## 2023-01-06 DIAGNOSIS — D75839 Thrombocytosis, unspecified: Secondary | ICD-10-CM | POA: Diagnosis not present

## 2023-01-06 DIAGNOSIS — E7800 Pure hypercholesterolemia, unspecified: Secondary | ICD-10-CM | POA: Diagnosis not present

## 2023-01-06 DIAGNOSIS — Z7984 Long term (current) use of oral hypoglycemic drugs: Secondary | ICD-10-CM | POA: Diagnosis not present

## 2023-01-06 DIAGNOSIS — E1122 Type 2 diabetes mellitus with diabetic chronic kidney disease: Secondary | ICD-10-CM | POA: Diagnosis not present

## 2023-01-06 DIAGNOSIS — Z7952 Long term (current) use of systemic steroids: Secondary | ICD-10-CM | POA: Diagnosis not present

## 2023-01-06 DIAGNOSIS — Z7902 Long term (current) use of antithrombotics/antiplatelets: Secondary | ICD-10-CM | POA: Diagnosis not present

## 2023-01-06 DIAGNOSIS — G20C Parkinsonism, unspecified: Secondary | ICD-10-CM | POA: Diagnosis not present

## 2023-01-06 DIAGNOSIS — I129 Hypertensive chronic kidney disease with stage 1 through stage 4 chronic kidney disease, or unspecified chronic kidney disease: Secondary | ICD-10-CM | POA: Diagnosis not present

## 2023-01-06 DIAGNOSIS — N182 Chronic kidney disease, stage 2 (mild): Secondary | ICD-10-CM | POA: Diagnosis not present

## 2023-01-06 NOTE — Procedures (Signed)
[facility]  HOME SLEEP TEST (Watch PAT) REPORT  STUDY DATE: 01/04/2023  DOB: October 06, 1950  MRN: [id number]  ORDERING CLINICIAN: Huston Foley, MD, [name]   REFERRING CLINICIAN: Dorothyann Peng, MD   CLINICAL INFORMATION/HISTORY: 72 year old right-handed woman with an underlying medical history of hypertension, diabetes, reflux disease, allergies, sleep apnea and overweight state, who presents for reevaluation of her obstructive sleep apnea.  She has a CPAP machine but has lost weight.  She had a baseline sleep study in January 2020 which showed a total AHI of 12.3/h, O2 nadir 88%.  She had absence of supine sleep at the time.  Her BMI was 30.5 at the time.  BMI: 27.3 kg/m  FINDINGS:   Sleep Summary:   Total Recording Time (hours, min): 8 hours, 45 min  Total Sleep Time (hours, min):  6 hours, 52 min  Percent REM (%):    10.1%   Respiratory Indices:   Calculated pAHI (per hour):  12.4/hour         REM pAHI:    18.3/hour       NREM pAHI: 12.2/hour  Central pAHI: n/a  Oxygen Saturation Statistics:    Oxygen Saturation (%) Mean: 97%   Minimum oxygen saturation (%):                 64%   O2 Saturation Range (%): 64-100%    O2 Saturation (minutes) <=88%: 0.2 min  Pulse Rate Statistics:   Pulse Mean (bpm):    68/min    Pulse Range (42-133/min)   IMPRESSION: OSA (obstructive sleep apnea) , mild  RECOMMENDATION:  This home sleep test demonstrates overall mild obstructive sleep apnea with a total AHI of 12.4/hour and O2 nadir of 64% (briefly, appears to be in REM sleep). The snore and position sensor was not detected during this test.  Given the patient's medical history and sleep related complaints, ongoing treatment with a positive airway pressure device is recommended.  The patient has a CPAP with a set pressure of 9 cm.  I recommend that she resume treatment on her current machine.  She is not quite eligible for a new machine.   A full night, in-lab  PAP titration study may aid in improving proper treatment settings and with mask fit, if needed, down the road. Alternative treatments may include weight loss (where appropriate) along with avoidance of the supine sleep position (if possible), or an oral appliance in appropriate candidates.   Please note that untreated obstructive sleep apnea may carry additional perioperative morbidity. Patients with significant obstructive sleep apnea should receive perioperative PAP therapy and the surgeons and particularly the anesthesiologist should be informed of the diagnosis and the severity of the sleep disordered breathing. The patient should be cautioned not to drive, work at heights, or operate dangerous or heavy equipment when tired or sleepy. Review and reiteration of good sleep hygiene measures should be pursued with any patient. Other causes of the patient's symptoms, including circadian rhythm disturbances, an underlying mood disorder, medication effect and/or an underlying medical problem cannot be ruled out based on this test. Clinical correlation is recommended.  The patient and her referring provider will be notified of the test results. The patient will be seen in follow up in sleep clinic at [facility], as necessary.  I certify that I have reviewed the raw data recording prior to the issuance of this report in accordance with the standards of the American Academy of Sleep Medicine (AASM).  INTERPRETING PHYSICIAN:   Calin Fantroy  Frances Furbish, MD, [name] Medical Director, [facility]) Diplomat, ABPN (Neurology and Sleep)   [facility] [address], [address] [place], [place] [address] [phone]

## 2023-01-09 ENCOUNTER — Other Ambulatory Visit: Payer: Self-pay | Admitting: Internal Medicine

## 2023-01-10 ENCOUNTER — Other Ambulatory Visit: Payer: Self-pay | Admitting: Internal Medicine

## 2023-01-10 DIAGNOSIS — Z1231 Encounter for screening mammogram for malignant neoplasm of breast: Secondary | ICD-10-CM

## 2023-01-11 ENCOUNTER — Telehealth: Payer: Self-pay | Admitting: *Deleted

## 2023-01-11 ENCOUNTER — Other Ambulatory Visit: Payer: Self-pay

## 2023-01-11 DIAGNOSIS — E1122 Type 2 diabetes mellitus with diabetic chronic kidney disease: Secondary | ICD-10-CM | POA: Diagnosis not present

## 2023-01-11 DIAGNOSIS — I129 Hypertensive chronic kidney disease with stage 1 through stage 4 chronic kidney disease, or unspecified chronic kidney disease: Secondary | ICD-10-CM | POA: Diagnosis not present

## 2023-01-11 DIAGNOSIS — N182 Chronic kidney disease, stage 2 (mild): Secondary | ICD-10-CM | POA: Diagnosis not present

## 2023-01-11 DIAGNOSIS — E7800 Pure hypercholesterolemia, unspecified: Secondary | ICD-10-CM | POA: Diagnosis not present

## 2023-01-11 DIAGNOSIS — D75839 Thrombocytosis, unspecified: Secondary | ICD-10-CM | POA: Diagnosis not present

## 2023-01-11 DIAGNOSIS — Z7984 Long term (current) use of oral hypoglycemic drugs: Secondary | ICD-10-CM | POA: Diagnosis not present

## 2023-01-11 DIAGNOSIS — Z7902 Long term (current) use of antithrombotics/antiplatelets: Secondary | ICD-10-CM | POA: Diagnosis not present

## 2023-01-11 DIAGNOSIS — G20C Parkinsonism, unspecified: Secondary | ICD-10-CM | POA: Diagnosis not present

## 2023-01-11 DIAGNOSIS — Z7952 Long term (current) use of systemic steroids: Secondary | ICD-10-CM | POA: Diagnosis not present

## 2023-01-11 NOTE — Telephone Encounter (Signed)
-----   Message from April Larson sent at 01/06/2023  6:14 PM EDT ----- Patient requested reevaluation of her sleep apnea after weight loss.  She had a home sleep test on 01/04/2023 which showed mild obstructive sleep apnea, but she had a brief oxygen drop 64% during dream sleep.  Overall, she is still in the mild range but I recommend that she resume treatment with her CPAP.  She has a machine that is not quite 71 years old, set for 9 cm.  I recommend that she be more consistent with her CPAP.  If she needs supplies, please order supplies for her.  Please arrange for a follow-up appointment routinely with Margie Ege, NP, 6 to 8 months out from her last appointment with me.

## 2023-01-11 NOTE — Telephone Encounter (Signed)
I relayed the HST results per Dr. Johny Sax HST result note.  Pt verbalized understanding.  Made an appt with SS/NP for 05-17-2023 at 1545 for pt f/u. (Parkinsonism, memory, had hst 6-7 months ago using cpap).

## 2023-01-12 ENCOUNTER — Other Ambulatory Visit (HOSPITAL_COMMUNITY): Payer: Self-pay

## 2023-01-12 ENCOUNTER — Other Ambulatory Visit: Payer: Self-pay | Admitting: Internal Medicine

## 2023-01-13 DIAGNOSIS — E7800 Pure hypercholesterolemia, unspecified: Secondary | ICD-10-CM | POA: Diagnosis not present

## 2023-01-13 DIAGNOSIS — D75839 Thrombocytosis, unspecified: Secondary | ICD-10-CM | POA: Diagnosis not present

## 2023-01-13 DIAGNOSIS — Z7952 Long term (current) use of systemic steroids: Secondary | ICD-10-CM | POA: Diagnosis not present

## 2023-01-13 DIAGNOSIS — G20C Parkinsonism, unspecified: Secondary | ICD-10-CM | POA: Diagnosis not present

## 2023-01-13 DIAGNOSIS — N182 Chronic kidney disease, stage 2 (mild): Secondary | ICD-10-CM | POA: Diagnosis not present

## 2023-01-13 DIAGNOSIS — Z7902 Long term (current) use of antithrombotics/antiplatelets: Secondary | ICD-10-CM | POA: Diagnosis not present

## 2023-01-13 DIAGNOSIS — Z7984 Long term (current) use of oral hypoglycemic drugs: Secondary | ICD-10-CM | POA: Diagnosis not present

## 2023-01-13 DIAGNOSIS — E1122 Type 2 diabetes mellitus with diabetic chronic kidney disease: Secondary | ICD-10-CM | POA: Diagnosis not present

## 2023-01-13 DIAGNOSIS — I129 Hypertensive chronic kidney disease with stage 1 through stage 4 chronic kidney disease, or unspecified chronic kidney disease: Secondary | ICD-10-CM | POA: Diagnosis not present

## 2023-01-14 ENCOUNTER — Other Ambulatory Visit (HOSPITAL_COMMUNITY): Payer: Self-pay

## 2023-01-14 ENCOUNTER — Other Ambulatory Visit: Payer: Self-pay

## 2023-01-14 MED ORDER — TRUE METRIX BLOOD GLUCOSE TEST VI STRP
ORAL_STRIP | 12 refills | Status: AC
  Filled 2023-01-14: qty 100, 90d supply, fill #0

## 2023-01-17 ENCOUNTER — Other Ambulatory Visit (HOSPITAL_COMMUNITY): Payer: Self-pay

## 2023-01-18 ENCOUNTER — Other Ambulatory Visit (HOSPITAL_COMMUNITY): Payer: Self-pay

## 2023-01-19 ENCOUNTER — Other Ambulatory Visit (HOSPITAL_COMMUNITY): Payer: Self-pay

## 2023-01-19 ENCOUNTER — Other Ambulatory Visit: Payer: Self-pay

## 2023-01-19 DIAGNOSIS — E1122 Type 2 diabetes mellitus with diabetic chronic kidney disease: Secondary | ICD-10-CM

## 2023-01-19 MED ORDER — TRUE METRIX METER W/DEVICE KIT
PACK | 1 refills | Status: AC
  Filled 2023-01-19: qty 1, 1d supply, fill #0

## 2023-01-20 ENCOUNTER — Other Ambulatory Visit (HOSPITAL_COMMUNITY): Payer: Self-pay

## 2023-01-20 ENCOUNTER — Other Ambulatory Visit: Payer: Self-pay

## 2023-01-20 DIAGNOSIS — E1122 Type 2 diabetes mellitus with diabetic chronic kidney disease: Secondary | ICD-10-CM | POA: Diagnosis not present

## 2023-01-20 DIAGNOSIS — Z7952 Long term (current) use of systemic steroids: Secondary | ICD-10-CM | POA: Diagnosis not present

## 2023-01-20 DIAGNOSIS — I129 Hypertensive chronic kidney disease with stage 1 through stage 4 chronic kidney disease, or unspecified chronic kidney disease: Secondary | ICD-10-CM | POA: Diagnosis not present

## 2023-01-20 DIAGNOSIS — D75839 Thrombocytosis, unspecified: Secondary | ICD-10-CM | POA: Diagnosis not present

## 2023-01-20 DIAGNOSIS — N182 Chronic kidney disease, stage 2 (mild): Secondary | ICD-10-CM | POA: Diagnosis not present

## 2023-01-20 DIAGNOSIS — Z7902 Long term (current) use of antithrombotics/antiplatelets: Secondary | ICD-10-CM | POA: Diagnosis not present

## 2023-01-20 DIAGNOSIS — Z7984 Long term (current) use of oral hypoglycemic drugs: Secondary | ICD-10-CM | POA: Diagnosis not present

## 2023-01-20 DIAGNOSIS — G20C Parkinsonism, unspecified: Secondary | ICD-10-CM | POA: Diagnosis not present

## 2023-01-20 DIAGNOSIS — E7800 Pure hypercholesterolemia, unspecified: Secondary | ICD-10-CM | POA: Diagnosis not present

## 2023-01-21 DIAGNOSIS — I129 Hypertensive chronic kidney disease with stage 1 through stage 4 chronic kidney disease, or unspecified chronic kidney disease: Secondary | ICD-10-CM | POA: Diagnosis not present

## 2023-01-21 DIAGNOSIS — Z7984 Long term (current) use of oral hypoglycemic drugs: Secondary | ICD-10-CM | POA: Diagnosis not present

## 2023-01-21 DIAGNOSIS — E1122 Type 2 diabetes mellitus with diabetic chronic kidney disease: Secondary | ICD-10-CM | POA: Diagnosis not present

## 2023-01-21 DIAGNOSIS — Z7902 Long term (current) use of antithrombotics/antiplatelets: Secondary | ICD-10-CM | POA: Diagnosis not present

## 2023-01-21 DIAGNOSIS — G20C Parkinsonism, unspecified: Secondary | ICD-10-CM | POA: Diagnosis not present

## 2023-01-21 DIAGNOSIS — Z7952 Long term (current) use of systemic steroids: Secondary | ICD-10-CM | POA: Diagnosis not present

## 2023-01-21 DIAGNOSIS — N182 Chronic kidney disease, stage 2 (mild): Secondary | ICD-10-CM | POA: Diagnosis not present

## 2023-01-21 DIAGNOSIS — E7800 Pure hypercholesterolemia, unspecified: Secondary | ICD-10-CM | POA: Diagnosis not present

## 2023-01-21 DIAGNOSIS — D75839 Thrombocytosis, unspecified: Secondary | ICD-10-CM | POA: Diagnosis not present

## 2023-01-24 ENCOUNTER — Ambulatory Visit
Admission: RE | Admit: 2023-01-24 | Discharge: 2023-01-24 | Disposition: A | Discharge status: Home / Self Care | Payer: Medicare PPO | Source: Ambulatory Visit | Attending: Internal Medicine | Admitting: Internal Medicine

## 2023-01-24 DIAGNOSIS — Z1231 Encounter for screening mammogram for malignant neoplasm of breast: Secondary | ICD-10-CM | POA: Diagnosis not present

## 2023-01-25 DIAGNOSIS — Z7984 Long term (current) use of oral hypoglycemic drugs: Secondary | ICD-10-CM | POA: Diagnosis not present

## 2023-01-25 DIAGNOSIS — Z7952 Long term (current) use of systemic steroids: Secondary | ICD-10-CM | POA: Diagnosis not present

## 2023-01-25 DIAGNOSIS — D75839 Thrombocytosis, unspecified: Secondary | ICD-10-CM | POA: Diagnosis not present

## 2023-01-25 DIAGNOSIS — E1122 Type 2 diabetes mellitus with diabetic chronic kidney disease: Secondary | ICD-10-CM | POA: Diagnosis not present

## 2023-01-25 DIAGNOSIS — E7800 Pure hypercholesterolemia, unspecified: Secondary | ICD-10-CM | POA: Diagnosis not present

## 2023-01-25 DIAGNOSIS — N182 Chronic kidney disease, stage 2 (mild): Secondary | ICD-10-CM | POA: Diagnosis not present

## 2023-01-25 DIAGNOSIS — G20C Parkinsonism, unspecified: Secondary | ICD-10-CM | POA: Diagnosis not present

## 2023-01-25 DIAGNOSIS — Z7902 Long term (current) use of antithrombotics/antiplatelets: Secondary | ICD-10-CM | POA: Diagnosis not present

## 2023-01-25 DIAGNOSIS — I129 Hypertensive chronic kidney disease with stage 1 through stage 4 chronic kidney disease, or unspecified chronic kidney disease: Secondary | ICD-10-CM | POA: Diagnosis not present

## 2023-01-26 DIAGNOSIS — N182 Chronic kidney disease, stage 2 (mild): Secondary | ICD-10-CM | POA: Diagnosis not present

## 2023-01-26 DIAGNOSIS — D75839 Thrombocytosis, unspecified: Secondary | ICD-10-CM | POA: Diagnosis not present

## 2023-01-26 DIAGNOSIS — G20C Parkinsonism, unspecified: Secondary | ICD-10-CM | POA: Diagnosis not present

## 2023-01-26 DIAGNOSIS — Z7952 Long term (current) use of systemic steroids: Secondary | ICD-10-CM | POA: Diagnosis not present

## 2023-01-26 DIAGNOSIS — I129 Hypertensive chronic kidney disease with stage 1 through stage 4 chronic kidney disease, or unspecified chronic kidney disease: Secondary | ICD-10-CM | POA: Diagnosis not present

## 2023-01-26 DIAGNOSIS — E7800 Pure hypercholesterolemia, unspecified: Secondary | ICD-10-CM | POA: Diagnosis not present

## 2023-01-26 DIAGNOSIS — E1122 Type 2 diabetes mellitus with diabetic chronic kidney disease: Secondary | ICD-10-CM | POA: Diagnosis not present

## 2023-01-26 DIAGNOSIS — Z7902 Long term (current) use of antithrombotics/antiplatelets: Secondary | ICD-10-CM | POA: Diagnosis not present

## 2023-01-26 DIAGNOSIS — Z7984 Long term (current) use of oral hypoglycemic drugs: Secondary | ICD-10-CM | POA: Diagnosis not present

## 2023-01-28 MED FILL — Carbidopa & Levodopa Tab 25-100 MG: ORAL | 30 days supply | Qty: 120 | Fill #0 | Status: CN

## 2023-01-29 ENCOUNTER — Other Ambulatory Visit (HOSPITAL_COMMUNITY): Payer: Self-pay

## 2023-01-31 ENCOUNTER — Other Ambulatory Visit: Payer: Self-pay

## 2023-01-31 ENCOUNTER — Other Ambulatory Visit (HOSPITAL_COMMUNITY): Payer: Self-pay

## 2023-01-31 MED FILL — Carbidopa & Levodopa Tab 25-100 MG: ORAL | 30 days supply | Qty: 120 | Fill #0 | Status: AC

## 2023-02-01 ENCOUNTER — Other Ambulatory Visit (HOSPITAL_COMMUNITY): Payer: Self-pay

## 2023-02-09 DIAGNOSIS — I739 Peripheral vascular disease, unspecified: Secondary | ICD-10-CM | POA: Diagnosis not present

## 2023-02-09 DIAGNOSIS — D75839 Thrombocytosis, unspecified: Secondary | ICD-10-CM | POA: Diagnosis not present

## 2023-02-09 DIAGNOSIS — E7800 Pure hypercholesterolemia, unspecified: Secondary | ICD-10-CM | POA: Diagnosis not present

## 2023-02-09 DIAGNOSIS — I129 Hypertensive chronic kidney disease with stage 1 through stage 4 chronic kidney disease, or unspecified chronic kidney disease: Secondary | ICD-10-CM | POA: Diagnosis not present

## 2023-02-09 DIAGNOSIS — Z7984 Long term (current) use of oral hypoglycemic drugs: Secondary | ICD-10-CM | POA: Diagnosis not present

## 2023-02-09 DIAGNOSIS — N182 Chronic kidney disease, stage 2 (mild): Secondary | ICD-10-CM | POA: Diagnosis not present

## 2023-02-09 DIAGNOSIS — G20C Parkinsonism, unspecified: Secondary | ICD-10-CM | POA: Diagnosis not present

## 2023-02-09 DIAGNOSIS — Z7902 Long term (current) use of antithrombotics/antiplatelets: Secondary | ICD-10-CM | POA: Diagnosis not present

## 2023-02-09 DIAGNOSIS — E1122 Type 2 diabetes mellitus with diabetic chronic kidney disease: Secondary | ICD-10-CM | POA: Diagnosis not present

## 2023-02-09 DIAGNOSIS — Z7952 Long term (current) use of systemic steroids: Secondary | ICD-10-CM | POA: Diagnosis not present

## 2023-02-14 DIAGNOSIS — Z7902 Long term (current) use of antithrombotics/antiplatelets: Secondary | ICD-10-CM | POA: Diagnosis not present

## 2023-02-14 DIAGNOSIS — N182 Chronic kidney disease, stage 2 (mild): Secondary | ICD-10-CM | POA: Diagnosis not present

## 2023-02-14 DIAGNOSIS — D75839 Thrombocytosis, unspecified: Secondary | ICD-10-CM | POA: Diagnosis not present

## 2023-02-14 DIAGNOSIS — E7800 Pure hypercholesterolemia, unspecified: Secondary | ICD-10-CM | POA: Diagnosis not present

## 2023-02-14 DIAGNOSIS — E1122 Type 2 diabetes mellitus with diabetic chronic kidney disease: Secondary | ICD-10-CM | POA: Diagnosis not present

## 2023-02-14 DIAGNOSIS — I129 Hypertensive chronic kidney disease with stage 1 through stage 4 chronic kidney disease, or unspecified chronic kidney disease: Secondary | ICD-10-CM | POA: Diagnosis not present

## 2023-02-14 DIAGNOSIS — G20C Parkinsonism, unspecified: Secondary | ICD-10-CM | POA: Diagnosis not present

## 2023-02-14 DIAGNOSIS — Z7984 Long term (current) use of oral hypoglycemic drugs: Secondary | ICD-10-CM | POA: Diagnosis not present

## 2023-02-14 DIAGNOSIS — Z794 Long term (current) use of insulin: Secondary | ICD-10-CM | POA: Diagnosis not present

## 2023-02-17 DIAGNOSIS — Z7902 Long term (current) use of antithrombotics/antiplatelets: Secondary | ICD-10-CM | POA: Diagnosis not present

## 2023-02-17 DIAGNOSIS — Z7984 Long term (current) use of oral hypoglycemic drugs: Secondary | ICD-10-CM | POA: Diagnosis not present

## 2023-02-17 DIAGNOSIS — D75839 Thrombocytosis, unspecified: Secondary | ICD-10-CM | POA: Diagnosis not present

## 2023-02-17 DIAGNOSIS — E7800 Pure hypercholesterolemia, unspecified: Secondary | ICD-10-CM | POA: Diagnosis not present

## 2023-02-17 DIAGNOSIS — I129 Hypertensive chronic kidney disease with stage 1 through stage 4 chronic kidney disease, or unspecified chronic kidney disease: Secondary | ICD-10-CM | POA: Diagnosis not present

## 2023-02-17 DIAGNOSIS — Z794 Long term (current) use of insulin: Secondary | ICD-10-CM | POA: Diagnosis not present

## 2023-02-17 DIAGNOSIS — E1122 Type 2 diabetes mellitus with diabetic chronic kidney disease: Secondary | ICD-10-CM | POA: Diagnosis not present

## 2023-02-17 DIAGNOSIS — N182 Chronic kidney disease, stage 2 (mild): Secondary | ICD-10-CM | POA: Diagnosis not present

## 2023-02-17 DIAGNOSIS — G20C Parkinsonism, unspecified: Secondary | ICD-10-CM | POA: Diagnosis not present

## 2023-02-21 DIAGNOSIS — E1122 Type 2 diabetes mellitus with diabetic chronic kidney disease: Secondary | ICD-10-CM | POA: Diagnosis not present

## 2023-02-21 DIAGNOSIS — D75839 Thrombocytosis, unspecified: Secondary | ICD-10-CM | POA: Diagnosis not present

## 2023-02-21 DIAGNOSIS — Z7902 Long term (current) use of antithrombotics/antiplatelets: Secondary | ICD-10-CM | POA: Diagnosis not present

## 2023-02-21 DIAGNOSIS — N182 Chronic kidney disease, stage 2 (mild): Secondary | ICD-10-CM | POA: Diagnosis not present

## 2023-02-21 DIAGNOSIS — I129 Hypertensive chronic kidney disease with stage 1 through stage 4 chronic kidney disease, or unspecified chronic kidney disease: Secondary | ICD-10-CM | POA: Diagnosis not present

## 2023-02-21 DIAGNOSIS — Z794 Long term (current) use of insulin: Secondary | ICD-10-CM | POA: Diagnosis not present

## 2023-02-21 DIAGNOSIS — G20C Parkinsonism, unspecified: Secondary | ICD-10-CM | POA: Diagnosis not present

## 2023-02-21 DIAGNOSIS — E7800 Pure hypercholesterolemia, unspecified: Secondary | ICD-10-CM | POA: Diagnosis not present

## 2023-02-21 DIAGNOSIS — Z7984 Long term (current) use of oral hypoglycemic drugs: Secondary | ICD-10-CM | POA: Diagnosis not present

## 2023-02-22 ENCOUNTER — Other Ambulatory Visit (HOSPITAL_COMMUNITY): Payer: Self-pay

## 2023-02-22 ENCOUNTER — Other Ambulatory Visit: Payer: Self-pay

## 2023-02-22 MED FILL — Carbidopa & Levodopa Tab 25-100 MG: ORAL | 30 days supply | Qty: 120 | Fill #1 | Status: AC

## 2023-02-23 ENCOUNTER — Other Ambulatory Visit: Payer: Self-pay

## 2023-02-24 DIAGNOSIS — I129 Hypertensive chronic kidney disease with stage 1 through stage 4 chronic kidney disease, or unspecified chronic kidney disease: Secondary | ICD-10-CM | POA: Diagnosis not present

## 2023-02-24 DIAGNOSIS — E7800 Pure hypercholesterolemia, unspecified: Secondary | ICD-10-CM | POA: Diagnosis not present

## 2023-02-24 DIAGNOSIS — G20C Parkinsonism, unspecified: Secondary | ICD-10-CM | POA: Diagnosis not present

## 2023-02-24 DIAGNOSIS — Z794 Long term (current) use of insulin: Secondary | ICD-10-CM | POA: Diagnosis not present

## 2023-02-24 DIAGNOSIS — D75839 Thrombocytosis, unspecified: Secondary | ICD-10-CM | POA: Diagnosis not present

## 2023-02-24 DIAGNOSIS — N182 Chronic kidney disease, stage 2 (mild): Secondary | ICD-10-CM | POA: Diagnosis not present

## 2023-02-24 DIAGNOSIS — Z7984 Long term (current) use of oral hypoglycemic drugs: Secondary | ICD-10-CM | POA: Diagnosis not present

## 2023-02-24 DIAGNOSIS — Z7902 Long term (current) use of antithrombotics/antiplatelets: Secondary | ICD-10-CM | POA: Diagnosis not present

## 2023-02-24 DIAGNOSIS — E1122 Type 2 diabetes mellitus with diabetic chronic kidney disease: Secondary | ICD-10-CM | POA: Diagnosis not present

## 2023-02-28 DIAGNOSIS — Z7984 Long term (current) use of oral hypoglycemic drugs: Secondary | ICD-10-CM | POA: Diagnosis not present

## 2023-02-28 DIAGNOSIS — N182 Chronic kidney disease, stage 2 (mild): Secondary | ICD-10-CM | POA: Diagnosis not present

## 2023-02-28 DIAGNOSIS — Z794 Long term (current) use of insulin: Secondary | ICD-10-CM | POA: Diagnosis not present

## 2023-02-28 DIAGNOSIS — Z7902 Long term (current) use of antithrombotics/antiplatelets: Secondary | ICD-10-CM | POA: Diagnosis not present

## 2023-02-28 DIAGNOSIS — I129 Hypertensive chronic kidney disease with stage 1 through stage 4 chronic kidney disease, or unspecified chronic kidney disease: Secondary | ICD-10-CM | POA: Diagnosis not present

## 2023-02-28 DIAGNOSIS — E7800 Pure hypercholesterolemia, unspecified: Secondary | ICD-10-CM | POA: Diagnosis not present

## 2023-02-28 DIAGNOSIS — E1122 Type 2 diabetes mellitus with diabetic chronic kidney disease: Secondary | ICD-10-CM | POA: Diagnosis not present

## 2023-02-28 DIAGNOSIS — D75839 Thrombocytosis, unspecified: Secondary | ICD-10-CM | POA: Diagnosis not present

## 2023-02-28 DIAGNOSIS — G20C Parkinsonism, unspecified: Secondary | ICD-10-CM | POA: Diagnosis not present

## 2023-03-01 ENCOUNTER — Other Ambulatory Visit: Payer: Self-pay

## 2023-03-02 ENCOUNTER — Other Ambulatory Visit: Payer: Self-pay

## 2023-03-02 ENCOUNTER — Inpatient Hospital Stay (HOSPITAL_COMMUNITY)
Admission: EM | Admit: 2023-03-02 | Discharge: 2023-03-06 | DRG: 057 | Disposition: A | Discharge status: Home Health | Payer: Medicare PPO | Attending: Internal Medicine | Admitting: Internal Medicine

## 2023-03-02 ENCOUNTER — Emergency Department (HOSPITAL_COMMUNITY): Payer: Medicare PPO

## 2023-03-02 ENCOUNTER — Telehealth: Payer: Self-pay

## 2023-03-02 ENCOUNTER — Encounter (HOSPITAL_COMMUNITY): Payer: Self-pay

## 2023-03-02 ENCOUNTER — Ambulatory Visit: Payer: Medicare HMO | Admitting: Neurology

## 2023-03-02 DIAGNOSIS — N189 Chronic kidney disease, unspecified: Secondary | ICD-10-CM | POA: Diagnosis not present

## 2023-03-02 DIAGNOSIS — Z7982 Long term (current) use of aspirin: Secondary | ICD-10-CM

## 2023-03-02 DIAGNOSIS — G4733 Obstructive sleep apnea (adult) (pediatric): Secondary | ICD-10-CM | POA: Diagnosis present

## 2023-03-02 DIAGNOSIS — R55 Syncope and collapse: Secondary | ICD-10-CM | POA: Diagnosis not present

## 2023-03-02 DIAGNOSIS — Z794 Long term (current) use of insulin: Secondary | ICD-10-CM | POA: Diagnosis not present

## 2023-03-02 DIAGNOSIS — R296 Repeated falls: Secondary | ICD-10-CM | POA: Diagnosis present

## 2023-03-02 DIAGNOSIS — Z7984 Long term (current) use of oral hypoglycemic drugs: Secondary | ICD-10-CM | POA: Diagnosis not present

## 2023-03-02 DIAGNOSIS — E1122 Type 2 diabetes mellitus with diabetic chronic kidney disease: Secondary | ICD-10-CM | POA: Diagnosis present

## 2023-03-02 DIAGNOSIS — Z79899 Other long term (current) drug therapy: Secondary | ICD-10-CM | POA: Diagnosis not present

## 2023-03-02 DIAGNOSIS — G20A1 Parkinson's disease without dyskinesia, without mention of fluctuations: Secondary | ICD-10-CM | POA: Diagnosis not present

## 2023-03-02 DIAGNOSIS — Z66 Do not resuscitate: Secondary | ICD-10-CM | POA: Diagnosis not present

## 2023-03-02 DIAGNOSIS — G40909 Epilepsy, unspecified, not intractable, without status epilepticus: Secondary | ICD-10-CM | POA: Diagnosis present

## 2023-03-02 DIAGNOSIS — D631 Anemia in chronic kidney disease: Secondary | ICD-10-CM | POA: Diagnosis not present

## 2023-03-02 DIAGNOSIS — G20B1 Parkinson's disease with dyskinesia, without mention of fluctuations: Secondary | ICD-10-CM | POA: Diagnosis not present

## 2023-03-02 DIAGNOSIS — E1169 Type 2 diabetes mellitus with other specified complication: Secondary | ICD-10-CM | POA: Diagnosis not present

## 2023-03-02 DIAGNOSIS — E1151 Type 2 diabetes mellitus with diabetic peripheral angiopathy without gangrene: Secondary | ICD-10-CM | POA: Diagnosis present

## 2023-03-02 DIAGNOSIS — Z803 Family history of malignant neoplasm of breast: Secondary | ICD-10-CM | POA: Diagnosis not present

## 2023-03-02 DIAGNOSIS — G231 Progressive supranuclear ophthalmoplegia [Steele-Richardson-Olszewski]: Secondary | ICD-10-CM | POA: Diagnosis not present

## 2023-03-02 DIAGNOSIS — Z833 Family history of diabetes mellitus: Secondary | ICD-10-CM | POA: Diagnosis not present

## 2023-03-02 DIAGNOSIS — I129 Hypertensive chronic kidney disease with stage 1 through stage 4 chronic kidney disease, or unspecified chronic kidney disease: Secondary | ICD-10-CM | POA: Diagnosis present

## 2023-03-02 DIAGNOSIS — E785 Hyperlipidemia, unspecified: Secondary | ICD-10-CM | POA: Diagnosis present

## 2023-03-02 DIAGNOSIS — R569 Unspecified convulsions: Secondary | ICD-10-CM

## 2023-03-02 DIAGNOSIS — K219 Gastro-esophageal reflux disease without esophagitis: Secondary | ICD-10-CM | POA: Diagnosis present

## 2023-03-02 DIAGNOSIS — J309 Allergic rhinitis, unspecified: Secondary | ICD-10-CM | POA: Diagnosis present

## 2023-03-02 DIAGNOSIS — Z87891 Personal history of nicotine dependence: Secondary | ICD-10-CM

## 2023-03-02 DIAGNOSIS — R531 Weakness: Secondary | ICD-10-CM | POA: Diagnosis present

## 2023-03-02 DIAGNOSIS — E1165 Type 2 diabetes mellitus with hyperglycemia: Secondary | ICD-10-CM | POA: Diagnosis present

## 2023-03-02 DIAGNOSIS — I10 Essential (primary) hypertension: Secondary | ICD-10-CM | POA: Diagnosis not present

## 2023-03-02 DIAGNOSIS — G319 Degenerative disease of nervous system, unspecified: Secondary | ICD-10-CM | POA: Diagnosis not present

## 2023-03-02 DIAGNOSIS — Z7902 Long term (current) use of antithrombotics/antiplatelets: Secondary | ICD-10-CM

## 2023-03-02 HISTORY — DX: Parkinson's disease without dyskinesia, without mention of fluctuations: G20.A1

## 2023-03-02 LAB — CBC WITH DIFFERENTIAL/PLATELET
Abs Immature Granulocytes: 0.03 10*3/uL (ref 0.00–0.07)
Basophils Absolute: 0.1 10*3/uL (ref 0.0–0.1)
Basophils Relative: 1 %
Eosinophils Absolute: 0 10*3/uL (ref 0.0–0.5)
Eosinophils Relative: 0 %
HCT: 36.5 % (ref 36.0–46.0)
Hemoglobin: 11.8 g/dL — ABNORMAL LOW (ref 12.0–15.0)
Immature Granulocytes: 0 %
Lymphocytes Relative: 14 %
Lymphs Abs: 1.4 10*3/uL (ref 0.7–4.0)
MCH: 28.9 pg (ref 26.0–34.0)
MCHC: 32.3 g/dL (ref 30.0–36.0)
MCV: 89.2 fL (ref 80.0–100.0)
Monocytes Absolute: 0.5 10*3/uL (ref 0.1–1.0)
Monocytes Relative: 5 %
Neutro Abs: 8.1 10*3/uL — ABNORMAL HIGH (ref 1.7–7.7)
Neutrophils Relative %: 80 %
Platelets: 382 10*3/uL (ref 150–400)
RBC: 4.09 MIL/uL (ref 3.87–5.11)
RDW: 14.6 % (ref 11.5–15.5)
WBC: 10.1 10*3/uL (ref 4.0–10.5)
nRBC: 0 % (ref 0.0–0.2)

## 2023-03-02 LAB — RAPID URINE DRUG SCREEN, HOSP PERFORMED
Amphetamines: NOT DETECTED
Barbiturates: NOT DETECTED
Benzodiazepines: NOT DETECTED
Cocaine: NOT DETECTED
Opiates: NOT DETECTED
Tetrahydrocannabinol: NOT DETECTED

## 2023-03-02 LAB — URINALYSIS, ROUTINE W REFLEX MICROSCOPIC
Bacteria, UA: NONE SEEN
Bilirubin Urine: NEGATIVE
Glucose, UA: NEGATIVE mg/dL
Hgb urine dipstick: NEGATIVE
Ketones, ur: 5 mg/dL — AB
Nitrite: NEGATIVE
Protein, ur: NEGATIVE mg/dL
Specific Gravity, Urine: 1.017 (ref 1.005–1.030)
pH: 5 (ref 5.0–8.0)

## 2023-03-02 LAB — HEPATIC FUNCTION PANEL
ALT: 8 U/L (ref 0–44)
AST: 12 U/L — ABNORMAL LOW (ref 15–41)
Albumin: 4 g/dL (ref 3.5–5.0)
Alkaline Phosphatase: 110 U/L (ref 38–126)
Bilirubin, Direct: <0.1 mg/dL (ref 0.0–0.2)
Total Bilirubin: 0.5 mg/dL (ref <1.2)
Total Protein: 7.5 g/dL (ref 6.5–8.1)

## 2023-03-02 LAB — BASIC METABOLIC PANEL
Anion gap: 8 (ref 5–15)
BUN: 27 mg/dL — ABNORMAL HIGH (ref 8–23)
CO2: 26 mmol/L (ref 22–32)
Calcium: 9.8 mg/dL (ref 8.9–10.3)
Chloride: 104 mmol/L (ref 98–111)
Creatinine, Ser: 0.79 mg/dL (ref 0.44–1.00)
GFR, Estimated: >60 mL/min (ref >60)
Glucose, Bld: 92 mg/dL (ref 70–99)
Potassium: 4 mmol/L (ref 3.5–5.1)
Sodium: 138 mmol/L (ref 135–145)

## 2023-03-02 LAB — PHOSPHORUS: Phosphorus: 3.1 mg/dL (ref 2.5–4.6)

## 2023-03-02 LAB — TSH: TSH: 1.062 u[IU]/mL (ref 0.350–4.500)

## 2023-03-02 LAB — CBG MONITORING, ED: Glucose-Capillary: 83 mg/dL (ref 70–99)

## 2023-03-02 LAB — GLUCOSE, CAPILLARY: Glucose-Capillary: 104 mg/dL — ABNORMAL HIGH (ref 70–99)

## 2023-03-02 LAB — VITAMIN B12: Vitamin B-12: 772 pg/mL (ref 180–914)

## 2023-03-02 LAB — MAGNESIUM: Magnesium: 2.1 mg/dL (ref 1.7–2.4)

## 2023-03-02 MED ORDER — RIVASTIGMINE TARTRATE 1.5 MG PO CAPS
1.5000 mg | ORAL_CAPSULE | Freq: Two times a day (BID) | ORAL | Status: DC
  Administered 2023-03-03 – 2023-03-06 (×8): 1.5 mg via ORAL
  Filled 2023-03-02 (×9): qty 1

## 2023-03-02 MED ORDER — ACETAMINOPHEN 325 MG PO TABS: 650.0000 mg | ORAL_TABLET | Freq: Four times a day (QID) | ORAL | Status: DC | PRN

## 2023-03-02 MED ORDER — CARBIDOPA-LEVODOPA 25-100 MG PO TABS
1.0000 | ORAL_TABLET | Freq: Four times a day (QID) | ORAL | Status: DC
  Administered 2023-03-03 – 2023-03-06 (×14): 1 via ORAL
  Filled 2023-03-02 (×14): qty 1

## 2023-03-02 MED ORDER — IRBESARTAN 300 MG PO TABS
150.0000 mg | ORAL_TABLET | Freq: Every day | ORAL | Status: DC
  Administered 2023-03-03 – 2023-03-05 (×2): 150 mg via ORAL
  Filled 2023-03-02 (×3): qty 1

## 2023-03-02 MED ORDER — ACETAMINOPHEN 650 MG RE SUPP: 650.0000 mg | Freq: Four times a day (QID) | RECTAL | Status: DC | PRN

## 2023-03-02 MED ORDER — MAGNESIUM OXIDE -MG SUPPLEMENT 400 (240 MG) MG PO TABS
200.0000 mg | ORAL_TABLET | Freq: Every day | ORAL | Status: DC
  Administered 2023-03-03 – 2023-03-05 (×4): 200 mg via ORAL
  Filled 2023-03-02 (×4): qty 1

## 2023-03-02 MED ORDER — SENNOSIDES-DOCUSATE SODIUM 8.6-50 MG PO TABS: 1.0000 | ORAL_TABLET | Freq: Every evening | ORAL | Status: DC | PRN

## 2023-03-02 MED ORDER — FLUTICASONE PROPIONATE 50 MCG/ACT NA SUSP
2.0000 | Freq: Every day | NASAL | Status: DC
  Administered 2023-03-03 – 2023-03-06 (×4): 2 via NASAL
  Filled 2023-03-02 (×2): qty 16

## 2023-03-02 MED ORDER — ENOXAPARIN SODIUM 40 MG/0.4ML IJ SOSY
40.0000 mg | PREFILLED_SYRINGE | INTRAMUSCULAR | Status: DC
  Administered 2023-03-02 – 2023-03-05 (×4): 40 mg via SUBCUTANEOUS
  Filled 2023-03-02 (×4): qty 0.4

## 2023-03-02 MED ORDER — CALCIUM CARBONATE 1250 (500 CA) MG PO TABS
1250.0000 mg | ORAL_TABLET | Freq: Every day | ORAL | Status: DC
  Administered 2023-03-03 – 2023-03-06 (×4): 1250 mg via ORAL
  Filled 2023-03-02 (×4): qty 1

## 2023-03-02 MED ORDER — ONDANSETRON HCL 4 MG/2ML IJ SOLN: 4.0000 mg | Freq: Four times a day (QID) | INTRAMUSCULAR | Status: DC | PRN

## 2023-03-02 MED ORDER — OMEGA-3-ACID ETHYL ESTERS 1 G PO CAPS
1.0000 g | ORAL_CAPSULE | Freq: Every day | ORAL | Status: DC
  Administered 2023-03-03 – 2023-03-06 (×4): 1 g via ORAL
  Filled 2023-03-02 (×4): qty 1

## 2023-03-02 MED ORDER — CLOPIDOGREL BISULFATE 75 MG PO TABS
75.0000 mg | ORAL_TABLET | Freq: Every day | ORAL | Status: DC
  Administered 2023-03-03 – 2023-03-05 (×3): 75 mg via ORAL
  Filled 2023-03-02 (×3): qty 1

## 2023-03-02 MED ORDER — BIOTIN 10 MG PO CAPS: 10.0000 mg | ORAL_CAPSULE | ORAL | Status: DC

## 2023-03-02 MED ORDER — VITAMIN D 25 MCG (1000 UNIT) PO TABS
5000.0000 [IU] | ORAL_TABLET | Freq: Every day | ORAL | Status: DC
  Administered 2023-03-03 – 2023-03-06 (×4): 5000 [IU] via ORAL
  Filled 2023-03-02 (×4): qty 5

## 2023-03-02 MED ORDER — DICLOFENAC SODIUM 1 % EX GEL
2.0000 g | Freq: Two times a day (BID) | CUTANEOUS | Status: DC
  Administered 2023-03-03 – 2023-03-06 (×6): 2 g via TOPICAL
  Filled 2023-03-02 (×2): qty 100

## 2023-03-02 MED ORDER — ACETAMINOPHEN 500 MG PO TABS
1000.0000 mg | ORAL_TABLET | Freq: Every day | ORAL | Status: DC
  Administered 2023-03-03 – 2023-03-05 (×4): 1000 mg via ORAL
  Filled 2023-03-02 (×4): qty 2

## 2023-03-02 MED ORDER — ONDANSETRON HCL 4 MG PO TABS: 4.0000 mg | ORAL_TABLET | Freq: Four times a day (QID) | ORAL | Status: DC | PRN

## 2023-03-02 MED ORDER — LORATADINE 10 MG PO TABS
10.0000 mg | ORAL_TABLET | Freq: Every day | ORAL | Status: DC
  Administered 2023-03-03 – 2023-03-06 (×4): 10 mg via ORAL
  Filled 2023-03-02 (×4): qty 1

## 2023-03-02 MED ORDER — METFORMIN HCL 500 MG PO TABS
500.0000 mg | ORAL_TABLET | Freq: Every day | ORAL | Status: DC
  Administered 2023-03-03: 500 mg via ORAL
  Filled 2023-03-02: qty 1

## 2023-03-02 MED ORDER — ASPIRIN 81 MG PO TBEC
81.0000 mg | DELAYED_RELEASE_TABLET | Freq: Every day | ORAL | Status: DC
  Administered 2023-03-03 – 2023-03-06 (×4): 81 mg via ORAL
  Filled 2023-03-02 (×3): qty 1

## 2023-03-02 MED ORDER — ATORVASTATIN CALCIUM 10 MG PO TABS
10.0000 mg | ORAL_TABLET | Freq: Every day | ORAL | Status: DC
  Administered 2023-03-03 – 2023-03-06 (×4): 10 mg via ORAL
  Filled 2023-03-02 (×4): qty 1

## 2023-03-02 MED ORDER — HYDROCHLOROTHIAZIDE 12.5 MG PO TABS: 12.5000 mg | ORAL_TABLET | Freq: Every day | ORAL | Status: DC

## 2023-03-02 MED ORDER — TELMISARTAN-HCTZ 40-12.5 MG PO TABS: 1.0000 | ORAL_TABLET | ORAL | Status: DC

## 2023-03-02 MED ORDER — PANTOPRAZOLE SODIUM 40 MG PO TBEC
40.0000 mg | DELAYED_RELEASE_TABLET | Freq: Every day | ORAL | Status: DC
  Administered 2023-03-03 – 2023-03-06 (×4): 40 mg via ORAL
  Filled 2023-03-02 (×4): qty 1

## 2023-03-02 MED ORDER — INSULIN ASPART 100 UNIT/ML IJ SOLN
0.0000 [IU] | Freq: Three times a day (TID) | INTRAMUSCULAR | Status: DC
  Administered 2023-03-04 (×2): 2 [IU] via SUBCUTANEOUS
  Administered 2023-03-05: 5 [IU] via SUBCUTANEOUS
  Administered 2023-03-06: 2 [IU] via SUBCUTANEOUS

## 2023-03-02 MED ORDER — VITAMIN C 500 MG PO TABS
500.0000 mg | ORAL_TABLET | Freq: Every day | ORAL | Status: DC
  Administered 2023-03-03 – 2023-03-06 (×4): 500 mg via ORAL
  Filled 2023-03-02 (×4): qty 1

## 2023-03-02 MED ORDER — ORAL CARE MOUTH RINSE: 15.0000 mL | OROMUCOSAL | Status: DC | PRN

## 2023-03-02 MED ORDER — TURMERIC 500 MG PO CAPS: 500.0000 mg | ORAL_CAPSULE | ORAL | Status: DC

## 2023-03-02 MED ORDER — ADULT MULTIVITAMIN W/MINERALS CH
1.0000 | ORAL_TABLET | Freq: Every day | ORAL | Status: DC
  Administered 2023-03-03 – 2023-03-06 (×4): 1 via ORAL
  Filled 2023-03-02 (×4): qty 1

## 2023-03-02 NOTE — Telephone Encounter (Signed)
Received call from patient's sister [name] stating that she has been having moments where she feels like she is going to faint. Explained to patient's sister that she should take her to the ED. Patient's sister states that she took her vital signs and that she thinks she is fine. Ms [name] also commented that this happened last week and patient was unconscious for about 5 minutes.

## 2023-03-02 NOTE — ED Provider Notes (Signed)
[place] EMERGENCY DEPARTMENT AT [facility] Provider Note   CSN: [id number] Arrival date & time: 03/02/23  1408     History  Chief Complaint  Patient presents with   Weakness    April Larson is a 72 y.o. female with past medical history significant for substance abuse, diabetes, hypertension, CKD, Parkinson disease presents to the ED after a episode where patient became very hot and disoriented for around 5 minutes.  Patient's family reports that patient was staring, not responding, and her tongue was out of her mouth.  Patient recalls feeling hot and was aware of her surroundings when she came to.  She had a similar episode a week ago where she fell from standing, but without prodrome or warning.  She states she does not know why she fell.  Her brother found her on the floor with the same open eyed stare that she had today.  She reports she has "not felt right" since the first episode.  Denies fall or injury with today's episode.  She does not have history of seizures.  Denies chest pain, shortness of breath, palpitations, dizziness, lightheadedness, headache, weakness, nausea, vomiting.  She has not had any recent changes in medication or recent illness.        Home Medications Prior to Admission medications   Medication Sig Start Date End Date Taking? Authorizing Provider  ACCU-CHEK FASTCLIX LANCETS MISC USE TO CHECK BLOOD SUGARS TWICE DAILY 02/14/18   Dorothyann Peng, MD  Alcohol Swabs (B-D SINGLE USE SWABS REGULAR) PADS USE AS NEEDED. 06/14/22   Dorothyann Peng, MD  aspirin EC 81 MG tablet Take 1 tablet (81 mg total) by mouth daily. 02/02/21   Dorothyann Peng, MD  atorvastatin (LIPITOR) 10 MG tablet TAKE 1 TABLET EVERY DAY 01/12/23   Dorothyann Peng, MD  Biotin 10 MG CAPS Take 1 tablet by mouth daily. 12/13/22   Dorothyann Peng, MD  Blood Glucose Monitoring Suppl (TRUE METRIX AIR GLUCOSE METER) DEVI USE AS DIRECTED TO CHECK BLOOD SUGARS. 06/11/22   Dorothyann Peng, MD  Blood Glucose Monitoring Suppl (TRUE METRIX METER) w/Device KIT Use as directed to check blood sugar once daily 01/19/23   Dorothyann Peng, MD  calcium carbonate (OSCAL) 1500 (600 Ca) MG TABS tablet Take 1 tablet (1,500 mg total) by mouth daily with breakfast. 02/02/21   Dorothyann Peng, MD  carbidopa-levodopa (SINEMET IR) 25-100 MG tablet Take 1 tablet by mouth 4 (four) times daily (1 tablet before breakfast, 1 tablet at noon, 1 tablet in the evening, and 1 tablet at bedtime). 01/05/23   Dorothyann Peng, MD  Cholecalciferol 125 MCG (5000 UT) TABS Take 1 tablet (5,000 Units total) by mouth daily. 02/02/21   Dorothyann Peng, MD  clopidogrel (PLAVIX) 75 MG tablet Take 75 mg by mouth daily. 07/26/22   [provider]  diclofenac Sodium (VOLTAREN) 1 % GEL APPLY 2 GRAMS TOPICALLY TO THE AFFECTED AREA FOUR TIMES DAILY 07/21/20   Dorothyann Peng, MD  fluticasone [facility]) 50 MCG/ACT nasal spray Place 2 sprays into both nostrils daily. 09/29/22   Arnette Felts, FNP  fluticasone (FLONASE) 50 MCG/ACT nasal spray Place 2 sprays into both nostrils daily as directed. 09/29/22     glucose blood (TRUE METRIX BLOOD GLUCOSE TEST) test strip Use as instructed 01/14/23   Dorothyann Peng, MD  Insulin Degludec-Liraglutide (XULTOPHY) 100-3.6 UNIT-MG/ML SOPN INJECT 14 UNITS SUBCUTANEOUSLY DAILY. 06/11/22   Dorothyann Peng, MD  Insulin Pen Needle (B-D ULTRAFINE III SHORT PEN) 31G X 8 MM  MISC Use as directed 06/14/22   Dorothyann Peng, MD  loratadine (CLARITIN) 10 MG tablet Take 1 tablet by mouth daily 02/02/21   Dorothyann Peng, MD  metFORMIN (GLUCOPHAGE) 500 MG tablet TAKE ONE TABLET BY MOUTH EVERYDAY AT BEDTIME 06/11/22   Dorothyann Peng, MD  metFORMIN (GLUCOPHAGE) 500 MG tablet Take 1 tablet (500 mg total) by mouth at bedtime. 03/25/22   Arnette Felts, FNP  Multiple Vitamins-Minerals (ONE-A-DAY WOMENS 50+ ADVANTAGE) TABS Take 1 tablet by mouth daily 02/02/21   Dorothyann Peng, MD  NON FORMULARY 2 each. Calm Patient not  taking: Reported on 11/24/2022    [provider]  Omega-3 Fatty Acids (FISH OIL) 1000 MG CAPS Take 1 capsule by mouth daily 02/02/21   Dorothyann Peng, MD  omeprazole (PRILOSEC) 40 MG capsule Take 1 capsule (40 mg total) by mouth daily before breakfast. 11/22/22   Dorothyann Peng, MD  omeprazole (PRILOSEC) 40 MG capsule Take 1 capsule (40 mg total) by mouth daily before breakfast. 11/22/22     rivastigmine (EXELON) 1.5 MG capsule Take 1 capsule (1.5 mg total) by mouth 2 (two) times daily. 09/13/22   Huston Foley, MD  telmisartan-hydrochlorothiazide (MICARDIS HCT) 40-12.5 MG tablet TAKE ONE TABLET BY MOUTH EVERY MORNING 11/22/22   Dorothyann Peng, MD  telmisartan-hydrochlorothiazide (MICARDIS HCT) 40-12.5 MG tablet Take 1 tablet by mouth every morning. 11/22/22     TRUEplus Lancets 28G MISC USE AS DIRECTED TO CHECK BLOOD SUGARS. 06/11/22   Dorothyann Peng, MD  Turmeric 500 MG CAPS Take 1 capsule by mouth 2 (two) times daily. 12/13/22   Dorothyann Peng, MD  vitamin C (ASCORBIC ACID) 500 MG tablet Take 1 tablet (500 mg total) by mouth daily. 02/02/21   Dorothyann Peng, MD      Allergies    Patient has no known allergies.    Review of Systems   Review of Systems  Neurological:  Positive for syncope. Negative for weakness.    Physical Exam Updated Vital Signs BP 139/81   Pulse 78   Temp 98.7 F (37.1 C) (Oral)   Resp 16   Ht 5\' 2"  (1.575 m)   Wt 63.5 kg   SpO2 100%   BMI 25.61 kg/m  Physical Exam Vitals and nursing note reviewed.  Constitutional:      General: She is not in acute distress.    Appearance: Normal appearance. She is not ill-appearing or diaphoretic.  Cardiovascular:     Rate and Rhythm: Normal rate and regular rhythm.  Pulmonary:     Effort: Pulmonary effort is normal. No tachypnea or respiratory distress.     Breath sounds: Normal breath sounds and air entry.  Skin:    General: Skin is warm and dry.     Capillary Refill: Capillary refill takes less than 2 seconds.   Neurological:     General: No focal deficit present.     Mental Status: She is alert and oriented to person, place, and time. Mental status is at baseline.     GCS: GCS eye subscore is 4. GCS verbal subscore is 5. GCS motor subscore is 6.     Cranial Nerves: No cranial nerve deficit.     Sensory: No sensory deficit.     Motor: No weakness or pronator drift.  Psychiatric:        Mood and Affect: Mood normal.        Behavior: Behavior normal.     ED Results / Procedures / Treatments   Labs (all labs ordered are  listed, but only abnormal results are displayed) Labs Reviewed  BASIC METABOLIC PANEL - Abnormal; Notable for the following components:      Result Value   BUN 27 (*)    All other components within normal limits  CBC WITH DIFFERENTIAL/PLATELET - Abnormal; Notable for the following components:   Hemoglobin 11.8 (*)    Neutro Abs 8.1 (*)    All other components within normal limits  URINALYSIS, ROUTINE W REFLEX MICROSCOPIC - Abnormal; Notable for the following components:   Ketones, ur 5 (*)    Leukocytes,Ua MODERATE (*)    All other components within normal limits  RAPID URINE DRUG SCREEN, HOSP PERFORMED  HEMOGLOBIN A1C  TSH  MAGNESIUM  PHOSPHORUS  VITAMIN B12  VITAMIN D 25 HYDROXY (VIT D DEFICIENCY, FRACTURES)  HEPATIC FUNCTION PANEL  CBG MONITORING, ED    EKG EKG Interpretation Date/Time:  Wednesday March 02 2023 14:16:11 EST Ventricular Rate:  86 PR Interval:  126 QRS Duration:  82 QT Interval:  375 QTC Calculation: 449 R Axis:   68  Text Interpretation: Sinus rhythm Atrial premature complex No significant change since last tracing Confirmed by Lorre Nick ([id number]) on 03/02/2023 3:07:09 PM  Radiology CT Head Wo Contrast  Result Date: 03/02/2023 CLINICAL DATA:  Syncope/presyncope, cerebrovascular cause suspected. EXAM: CT HEAD WITHOUT CONTRAST TECHNIQUE: Contiguous axial images were obtained from the base of the skull through the vertex without  intravenous contrast. RADIATION DOSE REDUCTION: This exam was performed according to the departmental dose-optimization program which includes automated exposure control, adjustment of the mA and/or kV according to patient size and/or use of iterative reconstruction technique. COMPARISON:  Head CT 03/27/2020 and MRI 06/07/2020 FINDINGS: Brain: There is no evidence of an acute infarct, intracranial hemorrhage, mass, midline shift, or extra-axial fluid collection. Mild cerebral atrophy is within normal limits for age. Cerebral white matter hypodensities are nonspecific but compatible with minimal chronic small vessel ischemic disease. Vascular: Calcified atherosclerosis at the skull base. No hyperdense vessel. Skull: No acute fracture or suspicious osseous lesion. Sinuses/Orbits: Visualized paranasal sinuses and mastoid air cells are clear. Bilateral cataract extraction. Other: None. IMPRESSION: No evidence of acute intracranial abnormality. Electronically Signed   By: Sebastian Ache M.D.   On: 03/02/2023 16:17    Procedures Procedures    Medications Ordered in ED Medications  enoxaparin (LOVENOX) injection 40 mg (has no administration in time range)  acetaminophen (TYLENOL) tablet 650 mg (has no administration in time range)    Or  acetaminophen (TYLENOL) suppository 650 mg (has no administration in time range)  senna-docusate (Senokot-S) tablet 1 tablet (has no administration in time range)  ondansetron (ZOFRAN) tablet 4 mg (has no administration in time range)    Or  ondansetron (ZOFRAN) injection 4 mg (has no administration in time range)    ED Course/ Medical Decision Making/ A&P                                 Medical Decision Making Amount and/or Complexity of Data Reviewed Labs: ordered. Radiology: ordered.  Risk Decision regarding hospitalization.   This patient presents to the ED with chief complaint(s) of syncopal episode with pertinent past medical history of Parkinson disease.   The complaint involves an extensive differential diagnosis and also carries with it a high risk of complications and morbidity.    The differential diagnosis includes syncope, seizure, metabolic derangement   The initial plan is to obtain labs, CT  head  Additional history obtained: Additional history obtained from family  Initial Assessment:   On exam, patient is resting comfortably in bed and does not appear to be in acute distress.  She is moving all extremities appropriately and there are no identifiable neuro deficits.  She is alert and oriented.  She is able to recall events.  Heart rate is normal in the 80s with regular rhythm.  Lungs clear to auscultation bilaterally.  She is afebrile.    Independent ECG/labs interpretation:  The following labs were independently interpreted:  CBC with mild anemia, no leukocytosis.  Metabolic panel without major electrolyte disturbance.  Renal function is normal.   Independent visualization and interpretation of imaging: I independently visualized the following imaging with scope of interpretation limited to determining acute life threatening conditions related to emergency care: CT head, which revealed no acute intracranial abnormality.  Consultations obtained:   I feel that patient would benefit from hospital admission for syncope vs seizure workup.  Spoke with Dr. Sharrell Ku with [facility] who agrees with hospital admission.    Disposition:   Patient to be admitted for [facility].           Final Clinical Impression(s) / ED Diagnoses Final diagnoses:  Atypical syncope    Rx / DC Orders ED Discharge Orders     None         Lenard Simmer, PA-C 03/02/23 1829    Lorre Nick, MD 03/04/23 1242

## 2023-03-02 NOTE — ED Provider Notes (Signed)
I provided a substantive portion of the care of this patient.  I personally made/approved the management plan for this patient and take responsibility for the patient management.  EKG Interpretation Date/Time:  Wednesday March 02 2023 14:16:11 EST Ventricular Rate:  86 PR Interval:  126 QRS Duration:  82 QT Interval:  375 QTC Calculation: 449 R Axis:   68  Text Interpretation: Sinus rhythm Atrial premature complex No significant change since last tracing Confirmed by Lorre Nick ([id number]) on 03/02/2023 3:07:09 PM   Patient is EKG per interpretation shows normal sinus rhythm.  Patient has had 2 episodes of syncope.  States that she has no prodromal symptoms.  On exam, she has no focal neurologic weakness.  Patient's history suspicious for possible cardiac arrhythmia versus seizure activity.  Patient will require admission   Lorre Nick, MD 03/02/23 1615

## 2023-03-02 NOTE — Progress Notes (Signed)
PHARMACIST - PHYSICIAN ORDER COMMUNICATION  CONCERNING: P&T Medication Policy on Herbal Medications  DESCRIPTION:  This patient's order for:  biotin and turmeric  has been noted.  This product(s) is classified as an "herbal" or natural product. Due to a lack of definitive safety studies or FDA approval, nonstandard manufacturing practices, plus the potential risk of unknown drug-drug interactions while on inpatient medications, the Pharmacy and Therapeutics Committee does not permit the use of "herbal" or natural products of this type within [facility].   ACTION TAKEN: The pharmacy department is unable to verify this order at this time and your patient has been informed of this safety policy. Please reevaluate patient's clinical condition at discharge and address if the herbal or natural product(s) should be resumed at that time.   Arley Phenix RPh 03/02/2023, 11:46 PM

## 2023-03-02 NOTE — Plan of Care (Signed)
  Problem: Education: Goal: Ability to describe self-care measures that may prevent or decrease complications (Diabetes Survival Skills Education) will improve Outcome: Progressing Goal: Individualized Educational Video(s) Outcome: Progressing   Problem: Coping: Goal: Ability to adjust to condition or change in health will improve Outcome: Progressing   Problem: Fluid Volume: Goal: Ability to maintain a balanced intake and output will improve Outcome: Progressing   Problem: Health Behavior/Discharge Planning: Goal: Ability to identify and utilize available resources and services will improve Outcome: Progressing Goal: Ability to manage health-related needs will improve Outcome: Progressing   Problem: Metabolic: Goal: Ability to maintain appropriate glucose levels will improve Outcome: Progressing   Problem: Nutritional: Goal: Maintenance of adequate nutrition will improve Outcome: Progressing Goal: Progress toward achieving an optimal weight will improve Outcome: Progressing   Problem: Skin Integrity: Goal: Risk for impaired skin integrity will decrease Outcome: Progressing   Problem: Tissue Perfusion: Goal: Adequacy of tissue perfusion will improve Outcome: Progressing   Problem: Education: Goal: Knowledge of General Education information will improve Description: Including pain rating scale, medication(s)/side effects and non-pharmacologic comfort measures Outcome: Progressing   Problem: Health Behavior/Discharge Planning: Goal: Ability to manage health-related needs will improve Outcome: Progressing   Problem: Clinical Measurements: Goal: Ability to maintain clinical measurements within normal limits will improve Outcome: Progressing Goal: Will remain free from infection Outcome: Progressing Goal: Diagnostic test results will improve Outcome: Progressing Goal: Respiratory complications will improve Outcome: Progressing Goal: Cardiovascular complication will  be avoided Outcome: Progressing   Problem: Activity: Goal: Risk for activity intolerance will decrease Outcome: Progressing   Problem: Nutrition: Goal: Adequate nutrition will be maintained Outcome: Progressing   Problem: Coping: Goal: Level of anxiety will decrease Outcome: Progressing   Problem: Elimination: Goal: Will not experience complications related to bowel motility Outcome: Progressing Goal: Will not experience complications related to urinary retention Outcome: Progressing   Problem: Pain Management: Goal: General experience of comfort will improve Outcome: Progressing   Problem: Safety: Goal: Ability to remain free from injury will improve Outcome: Progressing   Problem: Skin Integrity: Goal: Risk for impaired skin integrity will decrease Outcome: Progressing   Problem: Education: Goal: Knowledge of condition and prescribed therapy will improve Outcome: Progressing   Problem: Physical Regulation: Goal: Complications related to the disease process, condition or treatment will be avoided or minimized Outcome: Progressing   Problem: Education: Goal: Expressions of having a comfortable level of knowledge regarding the disease process will increase Outcome: Progressing   Problem: Coping: Goal: Ability to adjust to condition or change in health will improve Outcome: Progressing Goal: Ability to identify appropriate support needs will improve Outcome: Progressing   Problem: Health Behavior/Discharge Planning: Goal: Compliance with prescribed medication regimen will improve Outcome: Progressing   Problem: Medication: Goal: Risk for medication side effects will decrease Outcome: Progressing   Problem: Clinical Measurements: Goal: Complications related to the disease process, condition or treatment will be avoided or minimized Outcome: Progressing Goal: Diagnostic test results will improve Outcome: Progressing   Problem: Safety: Goal: Verbalization  of understanding the information provided will improve Outcome: Progressing   Problem: Self-Concept: Goal: Level of anxiety will decrease Outcome: Progressing Goal: Ability to verbalize feelings about condition will improve Outcome: Progressing

## 2023-03-02 NOTE — ED Notes (Signed)
ED TO INPATIENT HANDOFF REPORT  ED Nurse Name and Phone #: Clavin Ruhlman  S Name/Age/Gender April Larson 72 y.o. female Room/Bed: WA03/WA03  Code Status   Code Status: Limited: Do not attempt resuscitation (DNR) -DNR-LIMITED -Do Not Intubate/DNI   Home/SNF/Other Home Patient oriented to: self, place, time, and situation Is this baseline? Yes   Triage Complete: Triage complete  Chief Complaint Seizure-like activity [facility]) [R56.9]  Triage Note Patient suddenly felt very hot today around 11am and became disoriented for about 5 minutes. No falls. Denies chest pain or shortness of breath or dizziness. Feels back to baseline now.    Allergies No Known Allergies  Level of Care/Admitting Diagnosis ED Disposition     ED Disposition  Admit   Condition  --   Comment  Hospital Area: [facility] [[id number]]  Level of Care: Telemetry [5]  Admit to tele based on following criteria: Eval of Syncope  May place patient in observation at [facility] or [facility] if equivalent level of care is available:: No  Covid Evaluation: Asymptomatic - no recent exposure (last 10 days) testing not required  Diagnosis: Seizure-like activity [facility]) [[id number]]  Admitting Physician: Steffanie Rainwater [[id number]]  Attending Physician: Steffanie Rainwater [[id number]]          B Medical/Surgery History Past Medical History:  Diagnosis Date   Diabetes mellitus without complication (HCC)    Hypertension    Parkinson disease (HCC)    Substance abuse (HCC)    Past Surgical History:  Procedure Laterality Date   EYE SURGERY       A IV Location/Drains/Wounds Patient Lines/Drains/Airways Status     Active Line/Drains/Airways     None            Intake/Output Last 24 hours No intake or output data in the 24 hours ending 03/02/23 1830  Labs/Imaging Results for orders placed or performed during the hospital encounter of 03/02/23 (from the past 48 hour(s))   CBG monitoring, ED     Status: None   Collection Time: 03/02/23  2:18 PM  Result Value Ref Range   Glucose-Capillary 83 70 - 99 mg/dL    Comment: Glucose reference range applies only to samples taken after fasting for at least 8 hours.  Basic metabolic panel     Status: Abnormal   Collection Time: 03/02/23  2:50 PM  Result Value Ref Range   Sodium 138 135 - 145 mmol/L   Potassium 4.0 3.5 - 5.1 mmol/L   Chloride 104 98 - 111 mmol/L   CO2 26 22 - 32 mmol/L   Glucose, Bld 92 70 - 99 mg/dL    Comment: Glucose reference range applies only to samples taken after fasting for at least 8 hours.   BUN 27 (H) 8 - 23 mg/dL   Creatinine, Ser 6.57 0.44 - 1.00 mg/dL   Calcium 9.8 8.9 - 84.6 mg/dL   GFR, Estimated >96 >29 mL/min    Comment: (NOTE) Calculated using the CKD-EPI Creatinine Equation (2021)    Anion gap 8 5 - 15    Comment: Performed at [facility], [address], [place], [place] [address]  CBC with Differential     Status: Abnormal   Collection Time: 03/02/23  2:50 PM  Result Value Ref Range   WBC 10.1 4.0 - 10.5 K/uL   RBC 4.09 3.87 - 5.11 MIL/uL   Hemoglobin 11.8 (L) 12.0 - 15.0 g/dL   HCT 32.4 40.1 - 02.7 %  MCV 89.2 80.0 - 100.0 fL   MCH 28.9 26.0 - 34.0 pg   MCHC 32.3 30.0 - 36.0 g/dL   RDW 16.1 09.6 - 04.5 %   Platelets 382 150 - 400 K/uL   nRBC 0.0 0.0 - 0.2 %   Neutrophils Relative % 80 %   Neutro Abs 8.1 (H) 1.7 - 7.7 K/uL   Lymphocytes Relative 14 %   Lymphs Abs 1.4 0.7 - 4.0 K/uL   Monocytes Relative 5 %   Monocytes Absolute 0.5 0.1 - 1.0 K/uL   Eosinophils Relative 0 %   Eosinophils Absolute 0.0 0.0 - 0.5 K/uL   Basophils Relative 1 %   Basophils Absolute 0.1 0.0 - 0.1 K/uL   Immature Granulocytes 0 %   Abs Immature Granulocytes 0.03 0.00 - 0.07 K/uL    Comment: Performed at [facility], [address], [place], [place] [address]  Urinalysis, Routine w reflex microscopic -Urine, Clean Catch     Status: Abnormal    Collection Time: 03/02/23  5:05 PM  Result Value Ref Range   Color, Urine YELLOW YELLOW   APPearance CLEAR CLEAR   Specific Gravity, Urine 1.017 1.005 - 1.030   pH 5.0 5.0 - 8.0   Glucose, UA NEGATIVE NEGATIVE mg/dL   Hgb urine dipstick NEGATIVE NEGATIVE   Bilirubin Urine NEGATIVE NEGATIVE   Ketones, ur 5 (A) NEGATIVE mg/dL   Protein, ur NEGATIVE NEGATIVE mg/dL   Nitrite NEGATIVE NEGATIVE   Leukocytes,Ua MODERATE (A) NEGATIVE   RBC / HPF 0-5 0 - 5 RBC/hpf   WBC, UA 6-10 0 - 5 WBC/hpf   Bacteria, UA NONE SEEN NONE SEEN   Squamous Epithelial / HPF 0-5 0 - 5 /HPF   Mucus PRESENT     Comment: Performed at [facility], [address] [place], [place] [address]  Rapid urine drug screen (hospital performed)     Status: None   Collection Time: 03/02/23  5:05 PM  Result Value Ref Range   Opiates NONE DETECTED NONE DETECTED   Cocaine NONE DETECTED NONE DETECTED   Benzodiazepines NONE DETECTED NONE DETECTED   Amphetamines NONE DETECTED NONE DETECTED   Tetrahydrocannabinol NONE DETECTED NONE DETECTED   Barbiturates NONE DETECTED NONE DETECTED    Comment: (NOTE) DRUG SCREEN FOR MEDICAL PURPOSES ONLY.  IF CONFIRMATION IS NEEDED FOR ANY PURPOSE, NOTIFY LAB WITHIN 5 DAYS.  LOWEST DETECTABLE LIMITS FOR URINE DRUG SCREEN Drug Class                     Cutoff (ng/mL) Amphetamine and metabolites    1000 Barbiturate and metabolites    200 Benzodiazepine                 200 Opiates and metabolites        300 Cocaine and metabolites        300 THC                            50 Performed at [facility], [address], [place], [place] [address]    CT Head Wo Contrast  Result Date: 03/02/2023 CLINICAL DATA:  Syncope/presyncope, cerebrovascular cause suspected. EXAM: CT HEAD WITHOUT CONTRAST TECHNIQUE: Contiguous axial images were obtained from the base of the skull through the vertex without intravenous contrast. RADIATION DOSE REDUCTION: This exam  was performed according to the departmental dose-optimization program which includes automated exposure control, adjustment of  the mA and/or kV according to patient size and/or use of iterative reconstruction technique. COMPARISON:  Head CT 03/27/2020 and MRI 06/07/2020 FINDINGS: Brain: There is no evidence of an acute infarct, intracranial hemorrhage, mass, midline shift, or extra-axial fluid collection. Mild cerebral atrophy is within normal limits for age. Cerebral white matter hypodensities are nonspecific but compatible with minimal chronic small vessel ischemic disease. Vascular: Calcified atherosclerosis at the skull base. No hyperdense vessel. Skull: No acute fracture or suspicious osseous lesion. Sinuses/Orbits: Visualized paranasal sinuses and mastoid air cells are clear. Bilateral cataract extraction. Other: None. IMPRESSION: No evidence of acute intracranial abnormality. Electronically Signed   By: Sebastian Ache M.D.   On: 03/02/2023 16:17    Pending Labs Unresulted Labs (From admission, onward)     Start     Ordered   03/02/23 1814  VITAMIN D 25 Hydroxy (Vit-D Deficiency, Fractures)  Once,   R        03/02/23 1813   03/02/23 1814  Hepatic function panel  Once,   R        03/02/23 1813   03/02/23 1813  TSH  Once,   R        03/02/23 1813   03/02/23 1813  Magnesium  Once,   R        03/02/23 1813   03/02/23 1813  Phosphorus  Once,   R        03/02/23 1813   03/02/23 1813  Vitamin B12  Once,   R        03/02/23 1813   03/02/23 1812  Hemoglobin A1c  Once,   R        03/02/23 1813            Vitals/Pain Today's Vitals   03/02/23 1715 03/02/23 1724 03/02/23 1745 03/02/23 1815  BP: 139/81  (!) 153/87 (!) 147/80  Pulse: 73 78 82 78  Resp: 20 16 17 14   Temp:      TempSrc:      SpO2: 100% 100% 100% 99%  Weight:      Height:      PainSc:        Isolation Precautions No active isolations  Medications Medications  enoxaparin (LOVENOX) injection 40 mg (has no  administration in time range)  acetaminophen (TYLENOL) tablet 650 mg (has no administration in time range)    Or  acetaminophen (TYLENOL) suppository 650 mg (has no administration in time range)  senna-docusate (Senokot-S) tablet 1 tablet (has no administration in time range)  ondansetron (ZOFRAN) tablet 4 mg (has no administration in time range)    Or  ondansetron (ZOFRAN) injection 4 mg (has no administration in time range)    Mobility walks     Focused Assessments Neuro Assessment Handoff:  Swallow screen pass? Yes          Neuro Assessment:   Neuro Checks:      Has TPA been given? No If patient is a Neuro Trauma and patient is going to OR before floor call report to 4N Charge nurse: [phone] or [phone]   R Recommendations: See Admitting Provider Note  Report given to:   Additional Notes: 20RAC saline locked

## 2023-03-02 NOTE — ED Triage Notes (Signed)
Patient suddenly felt very hot today around 11am and became disoriented for about 5 minutes. No falls. Denies chest pain or shortness of breath or dizziness. Feels back to baseline now.

## 2023-03-02 NOTE — H&P (Signed)
History and Physical    Patient: April Larson DOB: Aug 19, 1950 DOA: 03/02/2023 DOS: the patient was seen and examined on 03/02/2023 PCP: Dorothyann Peng, MD  Patient coming from: Home  Chief Complaint:  Chief Complaint  Patient presents with   Weakness   HPI: April Larson is a 72 y.o. female with medical history significant of T2DM, HTN, GERD, OSA, Parkinson's disease, and supranuclear palsy who presented to the ED for evaluation of seizure-like activity. 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  Her brother noticed that patient was staring in space and did not respond to questions for about 5 minutes. Patient reports that she could hear her brother talking to her but was unable to answer him. She also reports her vision going blank for a few seconds during the syncope episode.  She recovered back to her baseline until today when she started feeling hot after breakfast. Her brother sat her down and noticed that she had another blank stare, with her tongue out of her mouth. She was unable to respond to her brother for about 5 to 6 minutes. Patient denies any prodromal symptoms or confusion after these episodes. She denies any chest pain, palpitations, dizziness, headaches, vision changes, nausea, vomiting, fevers, chills, dysuria, numbness, tingling or focal weakness. She denies any history of seizures.  ED course: Hypertensive with SBP in the 140s to 160s otherwise normal vitals. Labs show K+ 4.0, sodium 138, creatinine 0.79, WBC 10.1, Hgb 11.8, CBG 83, negative UDS, UA with moderate leuks, negative nitrite, WBC 6-10 and no bacteria. CT head with no acute intracranial abnormalities. TRH consulted for admission  Review of Systems: As mentioned in the history of present illness. All other  systems reviewed and are negative. Past Medical History:  Diagnosis Date   Diabetes mellitus without complication (HCC)    Hypertension    Parkinson disease (HCC)    Substance abuse (HCC)    Past Surgical History:  Procedure Laterality Date   EYE SURGERY     Social History:  reports that she quit smoking about 17 years ago. Her smoking use included cigarettes. She started smoking about 54 years ago. She has a 37 pack-year smoking history. She has never used smokeless tobacco. She reports that she does not drink alcohol and does not use drugs.  No Known Allergies  Family History  Problem Relation Age of Onset   Cancer Mother    Diabetes Father    Cancer Brother    Breast cancer Maternal Aunt    Parkinsonism Neg Hx     Prior to Admission medications   Medication Sig Start Date End Date Taking? Authorizing Provider  ACCU-CHEK FASTCLIX LANCETS MISC USE TO CHECK BLOOD SUGARS TWICE DAILY 02/14/18   Dorothyann Peng, MD  Alcohol Swabs (B-D SINGLE USE SWABS REGULAR) PADS USE AS NEEDED. 06/14/22   Dorothyann Peng, MD  aspirin EC 81 MG tablet Take 1 tablet (81 mg total) by mouth daily. 02/02/21   Dorothyann Peng, MD  atorvastatin (LIPITOR) 10 MG tablet TAKE 1 TABLET EVERY DAY 01/12/23   Dorothyann Peng, MD  Biotin 10 MG CAPS Take 1 tablet by mouth daily. 12/13/22   Dorothyann Peng, MD  Blood Glucose Monitoring Suppl (TRUE METRIX AIR GLUCOSE METER) DEVI USE AS DIRECTED TO CHECK BLOOD SUGARS. 06/11/22   Dorothyann Peng, MD  Blood Glucose  Monitoring Suppl (TRUE METRIX METER) w/Device KIT Use as directed to check blood sugar once daily 01/19/23   Dorothyann Peng, MD  calcium carbonate (OSCAL) 1500 (600 Ca) MG TABS tablet Take 1 tablet (1,500 mg total) by mouth daily with breakfast. 02/02/21   Dorothyann Peng, MD  carbidopa-levodopa (SINEMET IR) 25-100 MG tablet Take 1 tablet by mouth 4 (four) times daily (1 tablet before breakfast, 1 tablet at noon, 1 tablet in the evening, and 1 tablet at bedtime). 01/05/23    Dorothyann Peng, MD  Cholecalciferol 125 MCG (5000 UT) TABS Take 1 tablet (5,000 Units total) by mouth daily. 02/02/21   Dorothyann Peng, MD  clopidogrel (PLAVIX) 75 MG tablet Take 75 mg by mouth daily. 07/26/22   [provider]  diclofenac Sodium (VOLTAREN) 1 % GEL APPLY 2 GRAMS TOPICALLY TO THE AFFECTED AREA FOUR TIMES DAILY 07/21/20   Dorothyann Peng, MD  fluticasone [facility]) 50 MCG/ACT nasal spray Place 2 sprays into both nostrils daily. 09/29/22   Arnette Felts, FNP  fluticasone (FLONASE) 50 MCG/ACT nasal spray Place 2 sprays into both nostrils daily as directed. 09/29/22     glucose blood (TRUE METRIX BLOOD GLUCOSE TEST) test strip Use as instructed 01/14/23   Dorothyann Peng, MD  Insulin Degludec-Liraglutide (XULTOPHY) 100-3.6 UNIT-MG/ML SOPN INJECT 14 UNITS SUBCUTANEOUSLY DAILY. 06/11/22   Dorothyann Peng, MD  Insulin Pen Needle (B-D ULTRAFINE III SHORT PEN) 31G X 8 MM MISC Use as directed 06/14/22   Dorothyann Peng, MD  loratadine (CLARITIN) 10 MG tablet Take 1 tablet by mouth daily 02/02/21   Dorothyann Peng, MD  metFORMIN (GLUCOPHAGE) 500 MG tablet TAKE ONE TABLET BY MOUTH EVERYDAY AT BEDTIME 06/11/22   Dorothyann Peng, MD  metFORMIN (GLUCOPHAGE) 500 MG tablet Take 1 tablet (500 mg total) by mouth at bedtime. 03/25/22   Arnette Felts, FNP  Multiple Vitamins-Minerals (ONE-A-DAY WOMENS 50+ ADVANTAGE) TABS Take 1 tablet by mouth daily 02/02/21   Dorothyann Peng, MD  NON FORMULARY 2 each. Calm Patient not taking: Reported on 11/24/2022    [provider]  Omega-3 Fatty Acids (FISH OIL) 1000 MG CAPS Take 1 capsule by mouth daily 02/02/21   Dorothyann Peng, MD  omeprazole (PRILOSEC) 40 MG capsule Take 1 capsule (40 mg total) by mouth daily before breakfast. 11/22/22   Dorothyann Peng, MD  omeprazole (PRILOSEC) 40 MG capsule Take 1 capsule (40 mg total) by mouth daily before breakfast. 11/22/22     rivastigmine (EXELON) 1.5 MG capsule Take 1 capsule (1.5 mg total) by mouth 2 (two) times daily.  09/13/22   Huston Foley, MD  telmisartan-hydrochlorothiazide (MICARDIS HCT) 40-12.5 MG tablet TAKE ONE TABLET BY MOUTH EVERY MORNING 11/22/22   Dorothyann Peng, MD  telmisartan-hydrochlorothiazide (MICARDIS HCT) 40-12.5 MG tablet Take 1 tablet by mouth every morning. 11/22/22     TRUEplus Lancets 28G MISC USE AS DIRECTED TO CHECK BLOOD SUGARS. 06/11/22   Dorothyann Peng, MD  Turmeric 500 MG CAPS Take 1 capsule by mouth 2 (two) times daily. 12/13/22   Dorothyann Peng, MD  vitamin C (ASCORBIC ACID) 500 MG tablet Take 1 tablet (500 mg total) by mouth daily. 02/02/21   Dorothyann Peng, MD    Physical Exam: Vitals:   03/02/23 1414 03/02/23 1447 03/02/23 1715 03/02/23 1724  BP:   139/81   Pulse:   73 78  Resp:   20 16  Temp:  98.7 F (37.1 C)    TempSrc:  Oral    SpO2:   100% 100%  Weight: 63.5 kg  Height: 5\' 2"  (1.575 m)      General: Pleasant, well-appearing elderly woman laying in bed. No acute distress. HEENT: [place]/AT. Anicteric sclera. PERRLA. Impaired extraocular movement, no nystagmus. CV: RRR. No murmurs, rubs, or gallops. No LE edema Pulmonary: Lungs CTAB. Normal effort. No wheezing or rales. Abdominal: Soft, nontender, nondistended. Normal bowel sounds. Extremities: Palpable radial and DP pulses. Normal ROM. Skin: Warm and dry. No obvious rash or lesions. Neuro: A&Ox3. Mild left gaze preference. Impaired extraocular tracking, worst with upgaze and downgaze, inability to track past midline to the right. No nystagmus.  Normal finger-to-nose testing. Mild nuchal rigidity.  No cogwheel rigidity. Tongue midline. Normal sensation to light touch.  Strength 4/5 in all extremities. Psych: Normal mood and affect  Data Reviewed: {Tip this will not be part of the note when signed- Document your independent interpretation of telemetry tracing, EKG, lab, Radiology test or any other diagnostic tests. Add any new diagnostic test ordered today. (Optional):[id number]} Labs show K+ 4.0, sodium 138, creatinine  0.79, WBC 10.1, Hgb 11.8, CBG 83, negative UDS, UA with moderate leuks, negative nitrite, WBC 6-10 and no bacteria. CT head with no acute intracranial abnormalities.  Assessment and Plan: April Larson is a 72 y.o. female with medical history significant of T2DM, HTN, GERD, OSA, Parkinson's disease, and supranuclear palsy who presented to the ED for evaluation of seizure-like activity.   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 Followed by [facility]. Patient had DaTscan on 12/19/2020 which showed decreased radiotracer activity within the striata with greater deficit on the left and also thought to have supranuclear palsy by neuro-ophthalmology in 2023.  # HTN   # T2DM   # OSA   # GERD      Advance Care Planning:   Code Status: Limited: Do not attempt resuscitation (DNR) -DNR-LIMITED -Do Not Intubate/DNI    Consults: None  Family Communication: Discussed admission with sister at bedside  Severity of Illness: The appropriate patient status for this patient is OBSERVATION. Observation status is judged to be reasonable and necessary in order to provide the required intensity of service to ensure the patient's safety. The patient's presenting symptoms, physical exam findings, and initial radiographic and laboratory data in the context of their medical condition is felt to place them at decreased risk for further clinical deterioration. Furthermore, it is anticipated that the patient will be medically stable for discharge from the hospital within 2 midnights of admission.   Author: Steffanie Rainwater, MD 03/02/2023 6:14 PM  For on call review [email or web address].

## 2023-03-03 ENCOUNTER — Observation Stay (HOSPITAL_COMMUNITY): Payer: Medicare PPO

## 2023-03-03 DIAGNOSIS — G319 Degenerative disease of nervous system, unspecified: Secondary | ICD-10-CM | POA: Diagnosis not present

## 2023-03-03 DIAGNOSIS — K219 Gastro-esophageal reflux disease without esophagitis: Secondary | ICD-10-CM | POA: Diagnosis not present

## 2023-03-03 DIAGNOSIS — R55 Syncope and collapse: Secondary | ICD-10-CM | POA: Diagnosis not present

## 2023-03-03 DIAGNOSIS — G20B1 Parkinson's disease with dyskinesia, without mention of fluctuations: Secondary | ICD-10-CM

## 2023-03-03 DIAGNOSIS — R569 Unspecified convulsions: Secondary | ICD-10-CM | POA: Diagnosis not present

## 2023-03-03 LAB — HEMOGLOBIN A1C
Hgb A1c MFr Bld: 6.5 % — ABNORMAL HIGH (ref 4.8–5.6)
Mean Plasma Glucose: 139.85 mg/dL

## 2023-03-03 LAB — CBC
HCT: 35.4 % — ABNORMAL LOW (ref 36.0–46.0)
Hemoglobin: 11.2 g/dL — ABNORMAL LOW (ref 12.0–15.0)
MCH: 28 pg (ref 26.0–34.0)
MCHC: 31.6 g/dL (ref 30.0–36.0)
MCV: 88.5 fL (ref 80.0–100.0)
Platelets: 357 10*3/uL (ref 150–400)
RBC: 4 MIL/uL (ref 3.87–5.11)
RDW: 14.6 % (ref 11.5–15.5)
WBC: 6.9 10*3/uL (ref 4.0–10.5)
nRBC: 0 % (ref 0.0–0.2)

## 2023-03-03 LAB — BASIC METABOLIC PANEL
Anion gap: 9 (ref 5–15)
BUN: 24 mg/dL — ABNORMAL HIGH (ref 8–23)
CO2: 24 mmol/L (ref 22–32)
Calcium: 9.5 mg/dL (ref 8.9–10.3)
Chloride: 102 mmol/L (ref 98–111)
Creatinine, Ser: 0.85 mg/dL (ref 0.44–1.00)
GFR, Estimated: >60 mL/min (ref >60)
Glucose, Bld: 86 mg/dL (ref 70–99)
Potassium: 3.7 mmol/L (ref 3.5–5.1)
Sodium: 135 mmol/L (ref 135–145)

## 2023-03-03 LAB — VITAMIN D 25 HYDROXY (VIT D DEFICIENCY, FRACTURES): Vit D, 25-Hydroxy: 82.24 ng/mL (ref 30–100)

## 2023-03-03 LAB — GLUCOSE, CAPILLARY
Glucose-Capillary: 97 mg/dL (ref 70–99)
Glucose-Capillary: 102 mg/dL — ABNORMAL HIGH (ref 70–99)
Glucose-Capillary: 94 mg/dL (ref 70–99)
Glucose-Capillary: 108 mg/dL — ABNORMAL HIGH (ref 70–99)

## 2023-03-03 MED ORDER — GADOBUTROL 1 MMOL/ML IV SOLN
7.0000 mL | Freq: Once | INTRAVENOUS | Status: AC | PRN
  Administered 2023-03-03: 7 mL via INTRAVENOUS

## 2023-03-03 NOTE — Evaluation (Signed)
Occupational Therapy Evaluation Patient Details Name: April Larson MRN: [id number] DOB: 08/20/1950 Today's Date: 03/03/2023   History of Present Illness 72 y.o. female who presented to the ED for evaluation of seizure-like activity. Pt with medical history significant of T2DM, HTN, GERD, OSA, PVD, seasonal allergies, Parkinson's disease, and supranuclear palsy   Clinical Impression   PTA patient reports ambulating with cane and managing ADLs, some assist for IADls from her brother.  She is able to complete transfers and mobility with min guard assist, noted improved stability using RW vs cane. Completing ADLs with min assist to min guard for safety and visual deficits.  She reports baseline visual deficits, difficult to determine if any visual changes this admission; but she did have 1 episode of voicing "I can't see" when ambulating, reporting it resolved when she sat down.  Would recommend continued OT services acutely and after dc at [facility] level to optimize independence, safety with ADLs and mobility.        If plan is discharge home, recommend the following: A little help with walking and/or transfers;A little help with bathing/dressing/bathroom;Assistance with cooking/housework;Direct supervision/assist for medications management;Direct supervision/assist for financial management;Assist for transportation;Help with stairs or ramp for entrance    Functional Status Assessment  Patient has had a recent decline in their functional status and demonstrates the ability to make significant improvements in function in a reasonable and predictable amount of time.  Equipment Recommendations  BSC/3in1    Recommendations for Other Services       Precautions / Restrictions Precautions Precautions: Fall;Other (comment) Precaution Comments: 2 episodes of blank stare with inablility to talk last ~5 minutes (1 a week ago, other on 03/02/23), pt fell with first episode; denies other  falls in past 6 months; h/o poor close up vision Restrictions Weight Bearing Restrictions: No      Mobility Bed Mobility Overal bed mobility: Needs Assistance Bed Mobility: Supine to Sit     Supine to sit: Supervision     General bed mobility comments: verbal cues for technique    Transfers Overall transfer level: Needs assistance Equipment used: Straight cane, Rolling walker (2 wheels) Transfers: Sit to/from Stand Sit to Stand: Contact guard assist, Min assist           General transfer comment: pt was unable to stand from regular bed height, but from elevated bed she was able to stand wtih min guard assist and VCs for technique      Balance Overall balance assessment: Needs assistance, History of Falls Sitting-balance support: Feet supported Sitting balance-Leahy Scale: Good     Standing balance support: Bilateral upper extremity supported, During functional activity, Single extremity supported Standing balance-Leahy Scale: Fair Standing balance comment: mild unsteadiness but no loss of balance with 1 UE support, improved with BUE support                           ADL either performed or assessed with clinical judgement   ADL Overall ADL's : Needs assistance/impaired     Grooming: Minimal assistance;Standing           Upper Body Dressing : Minimal assistance;Sitting   Lower Body Dressing: Minimal assistance;Sit to/from stand   Toilet Transfer: Minimal assistance;Contact guard assist;Ambulation;Rolling walker (2 wheels)           Functional mobility during ADLs: Minimal assistance;Rolling walker (2 wheels)       Vision Ability to See in Adequate Light: 2 Moderately impaired  Patient Visual Report: No change from baseline Vision Assessment?: Yes Additional Comments: modified visual assessment completed, pt able to track therapist and locate number of fingers in all 4 quadrants ~8 inches away but requires eye shifts.  She reports her  vision has gotten worse, but unclear if from this admission or gradual decline overall. She does report much more difficulty with close up vision, able to read clock and locate items at distance in hallway.  When ambulating, pt reports "complete vision loss", but recovered when she sat down.     Perception         Praxis         Pertinent Vitals/Pain Pain Assessment Pain Assessment: Faces Faces Pain Scale: No hurt     Extremity/Trunk Assessment Upper Extremity Assessment Upper Extremity Assessment: Generalized weakness   Lower Extremity Assessment Lower Extremity Assessment: Defer to PT evaluation   Cervical / Trunk Assessment Cervical / Trunk Assessment: Normal   Communication Communication Communication: No apparent difficulties Cueing Techniques: Verbal cues;Tactile cues   Cognition Arousal: Alert Behavior During Therapy: WFL for tasks assessed/performed Overall Cognitive Status: No family/caregiver present to determine baseline cognitive functioning Area of Impairment: Memory, Problem solving                     Memory: Decreased short-term memory       Problem Solving: Slow processing, Requires verbal cues General Comments: patient with some slow processing and decreased ability to describe visual deficits.  She tends to repeat therapist vs describing what she is seeing/feeling.  No family present at time of eval.     General Comments  began to discuss compensatory techniques for visual deficits, including increased lighting and contrast    Exercises     Shoulder Instructions      Home Living Family/patient expects to be discharged to:: Private residence Living Arrangements: Other relatives (brother) Available Help at Discharge: Family;Available PRN/intermittently ("3/4 of the time") Type of Home: House Home Access: Stairs to enter [employer] of Steps: 5 Entrance Stairs-Rails: Right;Left Home Layout: One level     Bathroom  Shower/Tub: Producer, television/film/video: Standard     Home Equipment: Cane - single point;Grab bars - toilet;Shower Counsellor (2 wheels)          Prior Functioning/Environment Prior Level of Function : Independent/Modified Independent             Mobility Comments: uses cane for mobility, brother assists with steps ADLs Comments: was independnet with ADLs, brother assists with med mgmt        OT Problem List: Decreased strength;Decreased activity tolerance;Impaired balance (sitting and/or standing);Impaired vision/perception;Decreased cognition;Decreased safety awareness;Decreased knowledge of precautions;Decreased knowledge of use of DME or AE      OT Treatment/Interventions: Self-care/ADL training;Therapeutic exercise;DME and/or AE instruction;Therapeutic activities;Balance training;Patient/family education;Cognitive remediation/compensation    OT Goals(Current goals can be found in the care plan section) Acute Rehab OT Goals Patient Stated Goal: home OT Goal Formulation: With patient Time For Goal Achievement: 03/17/23 Potential to Achieve Goals: Good  OT Frequency: Min 1X/week    Co-evaluation   Reason for Co-Treatment: Complexity of the patient's impairments (multi-system involvement);For patient/therapist safety;To address functional/ADL transfers PT goals addressed during session: Mobility/safety with mobility;Balance;Proper use of DME        AM-PAC OT "6 Clicks" Daily Activity     Outcome Measure Help from another person eating meals?: A Little Help from another person taking care of personal grooming?: A Little Help  from another person toileting, which includes using toliet, bedpan, or urinal?: A Little Help from another person bathing (including washing, rinsing, drying)?: A Little Help from another person to put on and taking off regular upper body clothing?: A Little Help from another person to put on and taking off regular lower body  clothing?: A Little 6 Click Score: 18   End of Session Equipment Utilized During Treatment: Gait belt;Rolling walker (2 wheels);Other (comment) (cane) Nurse Communication: Mobility status  Activity Tolerance: Patient tolerated treatment well Patient left: in chair;with call bell/phone within reach;with chair alarm set  OT Visit Diagnosis: Other abnormalities of gait and mobility (R26.89);Muscle weakness (generalized) (M62.81);Low vision, both eyes (H54.2);History of falling (Z91.81)                Time: 4098-1191 OT Time Calculation (min): 23 min Charges:  OT General Charges $OT Visit: 1 Visit OT Evaluation $OT Eval Moderate Complexity: 1 Mod  Barry Brunner, OT Acute Rehabilitation Services Office [phone]   Chancy Milroy 03/03/2023, 1:17 PM

## 2023-03-03 NOTE — Progress Notes (Signed)
[facility]                                                                               April Larson, is a 72 y.o. female, DOB - 1950-07-13, HCW:[id number] Admit date - 03/02/2023    Outpatient Primary MD for the patient is Dorothyann Peng, MD  LOS - 0  days    Brief summary   April Larson is a 72 y.o. female with medical history significant of T2DM, HTN, GERD, OSA, Parkinson's disease, and supranuclear palsy who presented to the ED for evaluation of seizure-like activity. 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  Her brother noticed that patient was staring in space and did not respond to questions for about 5 minutes. She had another episode on 11/27 of staring and did not respond to her brother.  She was admitted for evaluation of seizures. CT head with no acute intracranial abnormalities. TRH consulted for admission. So far no signs of infection found.     Assessment & Plan    Assessment and Plan:  Syncope and seizure like activity:  With a history of atypical Parkinson's disease Evaluate for seizures.  MRI brain with and without contrast and EEG ordered for further evaluation.  Therapy evaluations ordered.  Neurology consult requested.    Atypical Parkinson's disease / Supranuclear palsy Follows up with Ath[facility] at [facility].  Patient with impaired extraocular tracking and decreased peripheral vision on exam that is chronic per sister. -Resume home Sinemet and rivastigmine -Outpatient follow-up with neurology and ophthalmology    Hypertension;  Optimal BP parameters.    Type 2 DM With hyperglycemia.  CBG (last 3)  Recent Labs    03/02/23 2110 03/03/23 0741 03/03/23 1105  GLUCAP 104* 97 102*   Resume SSI.     OSA'  Start CPAP    GERD Stable.   Estimated  body mass index is 25.61 kg/m as calculated from the following:   Height as of this encounter: 5\' 2"  (1.575 m).   Weight as of this encounter: 63.5 kg.  Code Status: DNR LIMITED.  DVT Prophylaxis:  enoxaparin (LOVENOX) injection 40 mg Start: 03/02/23 2200   Level of Care: Level of care: Telemetry Family Communication: none at bedside.   Disposition Plan:     Remains inpatient appropriate:  pending work up for seizures.   Procedures:  EEG MRI brain with and without contrast.   Consultants:   Neurology.   Antimicrobials:   Anti-infectives (From admission, onward)    None        Medications  Scheduled Meds:  acetaminophen  1,000 mg Oral QHS   ascorbic acid  500 mg Oral Daily   aspirin EC  81 mg Oral Daily   atorvastatin  10 mg Oral Daily   calcium carbonate  1,250 mg Oral Daily   carbidopa-levodopa  1 tablet Oral QID   cholecalciferol  5,000 Units Oral Daily   clopidogrel  75 mg Oral q1600   diclofenac Sodium  2 g Topical BID  enoxaparin (LOVENOX) injection  40 mg Subcutaneous Q24H   fluticasone  2 spray Each Nare Daily   insulin aspart  0-15 Units Subcutaneous TID WC   irbesartan  150 mg Oral Daily   loratadine  10 mg Oral Daily   magnesium oxide  200 mg Oral QHS   metFORMIN  500 mg Oral q1600   multivitamin with minerals  1 tablet Oral Daily   omega-3 acid ethyl esters  1 g Oral Daily   pantoprazole  40 mg Oral Daily   rivastigmine  1.5 mg Oral BID   Continuous Infusions: PRN Meds:.acetaminophen **OR** acetaminophen, ondansetron **OR** ondansetron (ZOFRAN) IV, mouth rinse, senna-docusate    Subjective:   April Larson was seen and examined today.  No new complaints. No headache, dizziness, or blurry vision.   Objective:   Vitals:   03/02/23 2024 03/03/23 0028 03/03/23 0427 03/03/23 0832  BP: (!) 154/70 135/74 129/73 (!) 145/78  Pulse: 90 80 77 79  Resp: 19 19 20 18   Temp: 97.8 F (36.6 C) 98.7 F (37.1 C) 98 F (36.7 C) 97.9 F (36.6  C)  TempSrc: Oral Oral Oral Oral  SpO2: 100% 98% 98% 100%  Weight:      Height:        Intake/Output Summary (Last 24 hours) at 03/03/2023 1228 Last data filed at 03/02/2023 2300 Gross per 24 hour  Intake 600 ml  Output 500 ml  Net 100 ml   Filed Weights   03/02/23 1414  Weight: 63.5 kg     Exam General: Alert and oriented x 3, NAD Cardiovascular: S1 S2 auscultated, no murmurs, RRR Respiratory: Clear to auscultation bilaterally, no wheezing, rales or rhonchi Gastrointestinal: Soft, nontender, nondistended, + bowel sounds Ext: no pedal edema bilaterally Neuro: AAOx3, Cr N's II- XII. Strength 5/5 upper and lower extremities bilaterally Skin: No rashes Psych: Normal affect and demeanor, alert and oriented x3    Data Reviewed:  I have personally reviewed following labs and imaging studies   CBC Lab Results  Component Value Date   WBC 6.9 03/03/2023   RBC 4.00 03/03/2023   HGB 11.2 (L) 03/03/2023   HCT 35.4 (L) 03/03/2023   MCV 88.5 03/03/2023   MCH 28.0 03/03/2023   PLT 357 03/03/2023   MCHC 31.6 03/03/2023   RDW 14.6 03/03/2023   LYMPHSABS 1.4 03/02/2023   MONOABS 0.5 03/02/2023   EOSABS 0.0 03/02/2023   BASOSABS 0.1 03/02/2023     Last metabolic panel Lab Results  Component Value Date   NA 135 03/03/2023   K 3.7 03/03/2023   CL 102 03/03/2023   CO2 24 03/03/2023   BUN 24 (H) 03/03/2023   CREATININE 0.85 03/03/2023   GLUCOSE 86 03/03/2023   GFRNONAA >60 03/03/2023   GFRAA 75 04/15/2020   CALCIUM 9.5 03/03/2023   PHOS 3.1 03/02/2023   PROT 7.5 03/02/2023   ALBUMIN 4.0 03/02/2023   LABGLOB 2.7 11/24/2022   AGRATIO 1.4 08/03/2022   BILITOT 0.5 03/02/2023   ALKPHOS 110 03/02/2023   AST 12 (L) 03/02/2023   ALT 8 03/02/2023   ANIONGAP 9 03/03/2023    CBG (last 3)  Recent Labs    03/02/23 2110 03/03/23 0741 03/03/23 1105  GLUCAP 104* 97 102*      Coagulation Profile: No results for input(s): "INR", "PROTIME" in the last 168  hours.   Radiology Studies: CT Head Wo Contrast  Result Date: 03/02/2023 CLINICAL DATA:  Syncope/presyncope, cerebrovascular cause suspected. EXAM: CT HEAD WITHOUT CONTRAST TECHNIQUE:  Contiguous axial images were obtained from the base of the skull through the vertex without intravenous contrast. RADIATION DOSE REDUCTION: This exam was performed according to the departmental dose-optimization program which includes automated exposure control, adjustment of the mA and/or kV according to patient size and/or use of iterative reconstruction technique. COMPARISON:  Head CT 03/27/2020 and MRI 06/07/2020 FINDINGS: Brain: There is no evidence of an acute infarct, intracranial hemorrhage, mass, midline shift, or extra-axial fluid collection. Mild cerebral atrophy is within normal limits for age. Cerebral white matter hypodensities are nonspecific but compatible with minimal chronic small vessel ischemic disease. Vascular: Calcified atherosclerosis at the skull base. No hyperdense vessel. Skull: No acute fracture or suspicious osseous lesion. Sinuses/Orbits: Visualized paranasal sinuses and mastoid air cells are clear. Bilateral cataract extraction. Other: None. IMPRESSION: No evidence of acute intracranial abnormality. Electronically Signed   By: Sebastian Ache M.D.   On: 03/02/2023 16:17       Kathlen Mody M.D. [facility] 03/03/2023, 12:28 PM  Available via Epic secure chat 7am-7pm After 7 pm, please refer to night coverage provider listed on amion.

## 2023-03-03 NOTE — Care Management Obs Status (Signed)
MEDICARE OBSERVATION STATUS NOTIFICATION   Patient Details  Name: April Larson MRN: [id number] Date of Birth: Nov 01, 1950   Medicare Observation Status Notification Given:  Yes    Otelia Santee, LCSW 03/03/2023, 11:20 AM

## 2023-03-03 NOTE — Plan of Care (Signed)
  Problem: Education: Goal: Ability to describe self-care measures that may prevent or decrease complications (Diabetes Survival Skills Education) will improve Outcome: Progressing Goal: Individualized Educational Video(s) Outcome: Progressing   Problem: Coping: Goal: Ability to adjust to condition or change in health will improve Outcome: Progressing   Problem: Fluid Volume: Goal: Ability to maintain a balanced intake and output will improve Outcome: Progressing   Problem: Health Behavior/Discharge Planning: Goal: Ability to identify and utilize available resources and services will improve Outcome: Progressing Goal: Ability to manage health-related needs will improve Outcome: Progressing   Problem: Metabolic: Goal: Ability to maintain appropriate glucose levels will improve Outcome: Progressing   Problem: Nutritional: Goal: Maintenance of adequate nutrition will improve Outcome: Progressing Goal: Progress toward achieving an optimal weight will improve Outcome: Progressing   Problem: Skin Integrity: Goal: Risk for impaired skin integrity will decrease Outcome: Progressing   Problem: Tissue Perfusion: Goal: Adequacy of tissue perfusion will improve Outcome: Progressing   Problem: Education: Goal: Knowledge of General Education information will improve Description: Including pain rating scale, medication(s)/side effects and non-pharmacologic comfort measures Outcome: Progressing   Problem: Health Behavior/Discharge Planning: Goal: Ability to manage health-related needs will improve Outcome: Progressing   Problem: Clinical Measurements: Goal: Ability to maintain clinical measurements within normal limits will improve Outcome: Progressing Goal: Will remain free from infection Outcome: Progressing Goal: Diagnostic test results will improve Outcome: Progressing Goal: Respiratory complications will improve Outcome: Progressing Goal: Cardiovascular complication will  be avoided Outcome: Progressing   Problem: Activity: Goal: Risk for activity intolerance will decrease Outcome: Progressing   Problem: Nutrition: Goal: Adequate nutrition will be maintained Outcome: Progressing   Problem: Coping: Goal: Level of anxiety will decrease Outcome: Progressing   Problem: Elimination: Goal: Will not experience complications related to bowel motility Outcome: Progressing Goal: Will not experience complications related to urinary retention Outcome: Progressing   Problem: Pain Management: Goal: General experience of comfort will improve Outcome: Progressing   Problem: Safety: Goal: Ability to remain free from injury will improve Outcome: Progressing   Problem: Skin Integrity: Goal: Risk for impaired skin integrity will decrease Outcome: Progressing   Problem: Education: Goal: Knowledge of condition and prescribed therapy will improve Outcome: Progressing   Problem: Physical Regulation: Goal: Complications related to the disease process, condition or treatment will be avoided or minimized Outcome: Progressing   Problem: Education: Goal: Expressions of having a comfortable level of knowledge regarding the disease process will increase Outcome: Progressing   Problem: Coping: Goal: Ability to adjust to condition or change in health will improve Outcome: Progressing Goal: Ability to identify appropriate support needs will improve Outcome: Progressing   Problem: Health Behavior/Discharge Planning: Goal: Compliance with prescribed medication regimen will improve Outcome: Progressing   Problem: Medication: Goal: Risk for medication side effects will decrease Outcome: Progressing   Problem: Clinical Measurements: Goal: Complications related to the disease process, condition or treatment will be avoided or minimized Outcome: Progressing Goal: Diagnostic test results will improve Outcome: Progressing   Problem: Safety: Goal: Verbalization  of understanding the information provided will improve Outcome: Progressing   Problem: Self-Concept: Goal: Level of anxiety will decrease Outcome: Progressing Goal: Ability to verbalize feelings about condition will improve Outcome: Progressing

## 2023-03-03 NOTE — Consult Note (Signed)
NEUROLOGY CONSULT NOTE   Date of service: March 03, 2023 Patient Name: April Larson MRN:  [id number] DOB:  1951/03/31 Chief Complaint: "unresponsive episodes", history obtained from patient and chart review Requesting Provider: Kathlen Mody, MD  History of Present Illness  April Larson is a 72 y.o. right-handed woman with a past medical history significant for progressive supranuclear palsy/atypical Parkinson's disease, type 2 diabetes, hypertension, obstructive sleep apnea on CPAP, peripheral vascular disease, BMI 25.6, DNR CODE STATUS  She reports she has had 2 events in the past 1.5 weeks of reduced responsiveness.  She does not recall the details of the events herself but reports her brother told her that the event she had yesterday prior to admission was very similar to the event she had about 1.5 weeks ago.  The first event she was walking outside and suddenly fell on her tailbone, remained sitting upright and was minimally interactive for about 5 minutes (but told brother that she could hear him, did not lose consciousness), then back to her normal self.  On 11/27 she had a similar episode when she was trying on clothes.  This episode she remembers feeling hot prior to the event, but does not note any clear vision changes, nausea, or other prodromal symptoms. She seemed to freeze but was able to be guided to a chair. Slumped over in the chair for 3-5 minutes with her tongue out. No incontinence.  She does not recall if she felt hot or not prior to the first event. 3rd event froze for just a few minutes while trying to walk to the bathroom; patient did not report this episode to me but brother did via phone; the patient's sister was with her for this third event.   While the patient does have some memory impairment at baseline per documentation in the chart, brother reports he has minimal concerns about her memory and it is very unusual for her to not  remember all of these events and to not be able to describe them clearly/consistently  She follows with Dr. Frances Furbish at [facility], last seen 09/13/2022 at which time Exelon (rivastigmine 1.5 mg twice daily) was started for worsening memory complaints.  She reports she has been tolerating this medication well without any noted side effects per her or brother.  She denies any other episodes of lightheadedness or falls other than the presenting event   ROS  Comprehensive ROS performed and pertinent positives documented in HPI, with caveat of mild memory impairment   Past History   Past Medical History:  Diagnosis Date   Diabetes mellitus without complication (HCC)    Hypertension    Parkinson disease (HCC)    Substance abuse (HCC)    Past Surgical History:  Procedure Laterality Date   EYE SURGERY      Family History: Family History  Problem Relation Age of Onset   Cancer Mother    Diabetes Father    Cancer Brother    Breast cancer Maternal Aunt    Parkinsonism Neg Hx     Social History  reports that she quit smoking about 17 years ago. Her smoking use included cigarettes. She started smoking about 54 years ago. She has a 37 pack-year smoking history. She has never used smokeless tobacco. She reports that she does not drink alcohol and does not use drugs.  No Known Allergies  Medications   Current Facility-Administered Medications:    acetaminophen (TYLENOL) tablet 650 mg, 650 mg, Oral, Q6H PRN **  OR** acetaminophen (TYLENOL) suppository 650 mg, 650 mg, Rectal, Q6H PRN, Kathlen Mody, MD   acetaminophen (TYLENOL) tablet 1,000 mg, 1,000 mg, Oral, QHS, Blake Divine, Vijaya, MD, 1,000 mg at 03/03/23 2102   ascorbic acid (VITAMIN C) tablet 500 mg, 500 mg, Oral, Daily, Blake Divine, Vijaya, MD, 500 mg at 03/03/23 1015   aspirin EC tablet 81 mg, 81 mg, Oral, Daily, Blake Divine, Vijaya, MD, 81 mg at 03/03/23 1017   atorvastatin (LIPITOR) tablet 10 mg, 10 mg, Oral, Daily, Kathlen Mody,  MD, 10 mg at 03/03/23 1017   calcium carbonate (OS-CAL - dosed in mg of elemental calcium) tablet 1,250 mg, 1,250 mg, Oral, Daily, Blake Divine, Vijaya, MD, 1,250 mg at 03/03/23 1013   carbidopa-levodopa (SINEMET IR) 25-100 MG per tablet immediate release 1 tablet, 1 tablet, Oral, QID, Kathlen Mody, MD, 1 tablet at 03/03/23 2103   cholecalciferol (VITAMIN D3) 25 MCG (1000 UNIT) tablet 5,000 Units, 5,000 Units, Oral, Daily, Kathlen Mody, MD, 5,000 Units at 03/03/23 1014   clopidogrel (PLAVIX) tablet 75 mg, 75 mg, Oral, q1600, Kathlen Mody, MD, 75 mg at 03/03/23 1723   diclofenac Sodium (VOLTAREN) 1 % topical gel 2 g, 2 g, Topical, BID, Kathlen Mody, MD, 2 g at 03/03/23 2103   enoxaparin (LOVENOX) injection 40 mg, 40 mg, Subcutaneous, Q24H, Blake Divine, Vijaya, MD, 40 mg at 03/03/23 2102   fluticasone (FLONASE) 50 MCG/ACT nasal spray 2 spray, 2 spray, Each Nare, Daily, Kathlen Mody, MD, 2 spray at 03/03/23 1016   insulin aspart (novoLOG) injection 0-15 Units, 0-15 Units, Subcutaneous, TID WC, Kathlen Mody, MD   irbesartan (AVAPRO) tablet 150 mg, 150 mg, Oral, Daily, 150 mg at 03/03/23 1015 **AND** [DISCONTINUED] hydrochlorothiazide (HYDRODIURIL) tablet 12.5 mg, 12.5 mg, Oral, Daily, Amponsah, Flossie Buffy, MD   loratadine (CLARITIN) tablet 10 mg, 10 mg, Oral, Daily, Blake Divine, Vijaya, MD, 10 mg at 03/03/23 1014   magnesium oxide (MAG-OX) tablet 200 mg, 200 mg, Oral, QHS, Akula, Vijaya, MD, 200 mg at 03/03/23 2103   metFORMIN (GLUCOPHAGE) tablet 500 mg, 500 mg, Oral, q1600, Kathlen Mody, MD, 500 mg at 03/03/23 1723   multivitamin with minerals tablet 1 tablet, 1 tablet, Oral, Daily, Kathlen Mody, MD, 1 tablet at 03/03/23 1016   omega-3 acid ethyl esters (LOVAZA) capsule 1 g, 1 g, Oral, Daily, Blake Divine, Vijaya, MD, 1 g at 03/03/23 1013   ondansetron (ZOFRAN) tablet 4 mg, 4 mg, Oral, Q6H PRN **OR** ondansetron (ZOFRAN) injection 4 mg, 4 mg, Intravenous, Q6H PRN, Kathlen Mody, MD   Oral care mouth rinse, 15 mL, Mouth  Rinse, PRN, Kathlen Mody, MD   pantoprazole (PROTONIX) EC tablet 40 mg, 40 mg, Oral, Daily, Blake Divine, Vijaya, MD, 40 mg at 03/03/23 1016   rivastigmine (EXELON) capsule 1.5 mg, 1.5 mg, Oral, BID, Kathlen Mody, MD, 1.5 mg at 03/03/23 2103   senna-docusate (Senokot-S) tablet 1 tablet, 1 tablet, Oral, QHS PRN, Kathlen Mody, MD  Current Outpatient Medications  Medication Instructions   ACCU-CHEK FASTCLIX LANCETS MISC USE TO CHECK BLOOD SUGARS TWICE DAILY   Alcohol Swabs (B-D SINGLE USE SWABS REGULAR) PADS USE AS NEEDED.   ascorbic acid (VITAMIN C) 500 mg, Oral, Daily   aspirin EC 81 mg, Oral, Daily   atorvastatin (LIPITOR) 10 mg, Oral, Daily   Biotin 10 MG CAPS 1 tablet, Oral, Daily   Blood Glucose Monitoring Suppl (TRUE METRIX AIR GLUCOSE METER) DEVI USE AS DIRECTED TO CHECK BLOOD SUGARS.   Blood Glucose Monitoring Suppl (TRUE METRIX METER) w/Device KIT Use as directed to check blood  sugar once daily   calcium carbonate (OSCAL) 1,500 mg, Oral, Daily with breakfast   carbidopa-levodopa (SINEMET IR) 25-100 MG tablet Take 1 tablet by mouth 4 (four) times daily (1 tablet before breakfast, 1 tablet at noon, 1 tablet in the evening, and 1 tablet at bedtime).   clopidogrel (PLAVIX) 75 mg, Oral, See admin instructions, Take 75 mg by mouth at 3:30 PM   diclofenac Sodium (VOLTAREN) 1 % GEL APPLY 2 GRAMS TOPICALLY TO THE AFFECTED AREA FOUR TIMES DAILY   fluticasone (FLONASE) 50 MCG/ACT nasal spray 2 sprays, Each Nare, Daily   glucose blood (TRUE METRIX BLOOD GLUCOSE TEST) test strip Use as instructed   Insulin Degludec-Liraglutide (XULTOPHY) 100-3.6 UNIT-MG/ML SOPN INJECT 14 UNITS SUBCUTANEOUSLY DAILY.   Insulin Pen Needle (B-D ULTRAFINE III SHORT PEN) 31G X 8 MM MISC Use as directed   loratadine (CLARITIN) 10 MG tablet Take 1 tablet by mouth daily   MAGNESIUM PO 1 tablet, Oral, Daily at bedtime   metFORMIN (GLUCOPHAGE) 500 MG tablet TAKE ONE TABLET BY MOUTH EVERYDAY AT BEDTIME   Multiple  Vitamins-Minerals (ONE-A-DAY WOMENS 50+ ADVANTAGE) TABS Take 1 tablet by mouth daily   Omega-3 Fatty Acids (FISH OIL) 1000 MG CAPS Take 1 capsule by mouth daily   omeprazole (PRILOSEC) 40 mg, Oral, Daily before breakfast   rivastigmine (EXELON) 1.5 mg, Oral, 2 times daily   telmisartan-hydrochlorothiazide (MICARDIS HCT) 40-12.5 MG tablet 1 tablet, Oral, Every morning   TRUEplus Lancets 28G MISC USE AS DIRECTED TO CHECK BLOOD SUGARS.   Turmeric 500 MG CAPS 1 capsule, Oral, 2 times daily   Tylenol 8 Hour Arthritis Pain 1,300 mg, Oral, Daily at bedtime   Vitamin D3 5,000 Units, Oral, See admin instructions, Take 5,000 units by mouth at 10:30 AM     Vitals   Vitals:   03/03/23 1527 03/03/23 1531 03/03/23 2025 03/03/23 2140  BP: (!) 153/78 (!) 159/80 128/76 (!) 153/69  Pulse: 88 100 93 86  Resp:   17 18  Temp:   98.3 F (36.8 C) 97.8 F (36.6 C)  TempSrc:   Oral Oral  SpO2:   99% 100%  Weight:      Height:        Body mass index is 25.61 kg/m.  Physical Exam   Constitutional: Appears well-developed and well-nourished.  Psych: Affect appropriate to situation, within limits of masked facies secondary to her neurodegenerative disease Eyes: No scleral injection.  HENT: No OP obstruction.  Head: Normocephalic.  Cardiovascular: Perfusing extremities well Respiratory: Effort normal, non-labored breathing.  GI: Soft.  No distension. There is no tenderness.  Skin: Warm dry and intact visible skin  Neurologic Examination   Neuro: Mental Status: Patient is awake, alert, oriented to person, place, month, year, and situation. Patient is able to give a clear and coherent history overall, although she only reports 2 events instead of 3 No signs of aphasia or neglect Cranial Nerves: II: Visual Fields are full. Pupils are equal, round, and reactive to light.   III,IV, VI: Eye movements are abnormally slow and vertical gaze is particularly restricted.  Some saccadic intrusions V: Facial  sensation is symmetric to temperature VII: Facial movement is symmetric.  VIII: hearing is intact to voice X: Uvula incompletely visualized secondary to anatomy XI: Shoulder shrug is symmetric XII: tongue is midline without atrophy or fasciculations.  Motor: Bradykinetic movements.  Does appear to have some mild atrophy in the lower extremities. 5/5 strength was present in all four extremities except for mild  hip flexor weakness, slightly worse on the right than the left but 4+ bilaterally.  Sensory: Sensation is symmetric to light touch and temperature in the arms and legs. Deep Tendon Reflexes: 3+ and symmetric in the brachioradialis and 2+ patellae.  Cerebellar: FNF and HKS are intact bilaterally  Labs/Imaging/Neurodiagnostic studies   CBC:  Recent Labs  Lab 03/05/23 1450 03/03/23 0622  WBC 10.1 6.9  NEUTROABS 8.1*  --   HGB 11.8* 11.2*  HCT 36.5 35.4*  MCV 89.2 88.5  PLT 382 357   Basic Metabolic Panel:  Lab Results  Component Value Date   NA 135 03/03/2023   K 3.7 03/03/2023   CO2 24 03/03/2023   GLUCOSE 86 03/03/2023   BUN 24 (H) 03/03/2023   CREATININE 0.85 03/03/2023   CALCIUM 9.5 03/03/2023   GFRNONAA >60 03/03/2023   GFRAA 75 04/15/2020   Lipid Panel:  Lab Results  Component Value Date   LDLCALC 73 11/24/2022   HgbA1c:  Lab Results  Component Value Date   HGBA1C 6.5 (H) Mar 05, 2023   Urine Drug Screen:     Component Value Date/Time   LABOPIA NONE DETECTED 05-Mar-2023 1705   COCAINSCRNUR NONE DETECTED 2023-03-05 1705   LABBENZ NONE DETECTED 03/05/23 1705   AMPHETMU NONE DETECTED 05-Mar-2023 1705   THCU NONE DETECTED 05-Mar-2023 1705   LABBARB NONE DETECTED 03/05/23 1705    MRI Brain(Personally reviewed): 1. No acute intracranial abnormality or significant interval change. 2. Stable mild atrophy and scattered white matter disease. This likely reflects the sequela of chronic microvascular ischemia.   ASSESSMENT   April Vilikia Watkins  Larson is a 72 y.o. with past medical history significant for atypical Parkinson's, diabetes, hypertension, OSA on CPAP, peripheral vascular disease, presenting with spells of altered awareness.  Given that all of the events have occurred with ambulation, orthostatic hypotension/freezing or cognitive fluctuation in the setting of her neurodegenerative condition is certainly possible.  However neurodegenerative conditions do carry increased risk of seizures and this is an important diagnosis to rule out.  Considered role of rivastigmine as it can have side effects of increasing lightheadedness and falls, but initiation of this medication does not seem to correlate well with symptom onset.  MRI brain reassuring against acute structural process and examination appears to be at her baseline   RECOMMENDATIONS  -Continuous EEG monitoring -Hold off on addition of antiseizure medications at this time -Continue home Sinemet and rivastigmine at this time -Neurology will follow along ______________________________________________________________________    Signed, Gordy Councilman, MD [facility]

## 2023-03-03 NOTE — Procedures (Addendum)
Patient Name: Dina Vilikia Watkins [place]  MRN: [id number]  Epilepsy Attending: Charlsie Quest  Referring Physician/Provider: Kathlen Mody, MD   Duration: 03/04/2023 0510 to 03/05/2023 0510  Patient history: 47 yoF with seizure like activity getting eeg to evaluate for seizure  Level of alertness: Awake,  asleep  AEDs during EEG study: None  Technical aspects: This EEG study was done with scalp electrodes positioned according to the 10-20 International system of electrode placement. Electrical activity was reviewed with band pass filter of 1-70Hz , sensitivity of 7 uV/mm, display speed of 87mm/sec with a 60Hz  notched filter applied as appropriate. EEG data were recorded continuously and digitally stored.  Video monitoring was available and reviewed as appropriate.  Description: The posterior dominant rhythm consists of 8-9 Hz activity of moderate voltage (25-35 uV) seen predominantly in posterior head regions, symmetric and reactive to eye opening and eye closing. Sleep was characterized by vertex waves, sleep spindles (12 to 14 Hz), maximal frontocentral region. Hyperventilation and photic stimulation were not performed.     IMPRESSION: This study is within normal limits. No seizures or epileptiform discharges were seen throughout the recording.  A normal interictal EEG does not exclude the diagnosis of epilepsy.  April Larson April Larson

## 2023-03-03 NOTE — Evaluation (Signed)
Physical Therapy Evaluation Patient Details Name: April Larson [place] MRN: [id number] DOB: 07-Oct-1950 Today's Date: 03/03/2023  History of Present Illness  72 y.o. female who presented to the ED for evaluation of seizure-like activity. Pt with medical history significant of T2DM, HTN, GERD, OSA, PVD, seasonal allergies, Parkinson's disease, and supranuclear palsy  Clinical Impression  Pt admitted with above diagnosis. Pt ambulated ~20', initially with a cane but was mildly unsteady so switched to a RW. Distance limited by onset of visual change, pt reported, "I can't see" while walking, this resolved once she sat down, pt remained responsive. At baseline she reports she has trouble with up close vision. Supervision for mobility recommended.  Pt currently with functional limitations due to the deficits listed below (see PT Problem List). Pt will benefit from acute skilled PT to increase their independence and safety with mobility to allow discharge.           If plan is discharge home, recommend the following: A little help with walking and/or transfers;A little help with bathing/dressing/bathroom;Assistance with cooking/housework;Assist for transportation;Help with stairs or ramp for entrance   Can travel by private vehicle        Equipment Recommendations None recommended by PT  Recommendations for Other Services       Functional Status Assessment Patient has had a recent decline in their functional status and demonstrates the ability to make significant improvements in function in a reasonable and predictable amount of time.     Precautions / Restrictions Precautions Precautions: Fall;Other (comment) Precaution Comments: 2 episodes of blank stare with inablility to talk last ~5 minutes (1 a week ago, other on 03/02/23), pt fell with first episode; denies other falls in past 6 months; h/o poor close up vision Restrictions Weight Bearing Restrictions: No      Mobility   Bed Mobility Overal bed mobility: Needs Assistance Bed Mobility: Supine to Sit     Supine to sit: Supervision     General bed mobility comments: verbal cues for technique    Transfers Overall transfer level: Needs assistance Equipment used: Straight cane Transfers: Sit to/from Stand Sit to Stand: Contact guard assist           General transfer comment: pt was unable to stand independently with bed in low position, from elevated bed she was able to stand wtih min/guard assist and VCs for technique (scoot to EOB)    Ambulation/Gait Ambulation/Gait assistance: Contact guard assist, Min assist Gait Distance (Feet): 20 Feet Assistive device: Straight cane, Rolling walker (2 wheels) Gait Pattern/deviations: Step-through pattern, Decreased stride length Gait velocity: decr     General Gait Details: mildly unsteady with SPC so switched to RW, pt was more steady with, decr velocity (pt reports due to poor vision), while walking pt reported vision got worse ("I can't see.") so was assisted to recliner where HR was 86, SpO2 99% on room air.  Stairs            Wheelchair Mobility     Tilt Bed    Modified Rankin (Stroke Patients Only)       Balance Overall balance assessment: Needs assistance, History of Falls Sitting-balance support: Feet supported Sitting balance-Leahy Scale: Good       Standing balance-Leahy Scale: Fair Standing balance comment: mildly unsteady walking with SPC but no over loss of balance  Pertinent Vitals/Pain Pain Assessment Pain Assessment: 0-10 Pain Score: 0-No pain    Home Living Family/patient expects to be discharged to:: Private residence Living Arrangements: Other relatives (brother) Available Help at Discharge: Family;Available PRN/intermittently ("3/4 of the time") Type of Home: House Home Access: Stairs to enter Entrance Stairs-Rails: Doctor, general practice of Steps: 5    Home Layout: One level Home Equipment: Cane - single point;Grab bars - toilet;Shower Counsellor (2 wheels)      Prior Function Prior Level of Function : Independent/Modified Independent             Mobility Comments: uses cane for mobility, brother assists with steps ADLs Comments: was independnet with ADLs     Extremity/Trunk Assessment   Upper Extremity Assessment Upper Extremity Assessment: Defer to OT evaluation    Lower Extremity Assessment Lower Extremity Assessment: Overall WFL for tasks assessed    Cervical / Trunk Assessment Cervical / Trunk Assessment: Normal  Communication   Communication Communication: No apparent difficulties Cueing Techniques: Tactile cues;Verbal cues  Cognition Arousal: Alert Behavior During Therapy: WFL for tasks assessed/performed Overall Cognitive Status: Within Functional Limits for tasks assessed                                 General Comments: poor close up vision        General Comments      Exercises     Assessment/Plan    PT Assessment Patient needs continued PT services  PT Problem List Decreased activity tolerance;Decreased balance;Decreased mobility       PT Treatment Interventions Therapeutic exercise;Balance training;Therapeutic activities;Gait training;DME instruction    PT Goals (Current goals can be found in the Care Plan section)  Acute Rehab PT Goals Patient Stated Goal: get back to walking independently PT Goal Formulation: With patient Time For Goal Achievement: 03/17/23 Potential to Achieve Goals: Good    Frequency Min 1X/week     Co-evaluation PT/OT/SLP Co-Evaluation/Treatment: Yes Reason for Co-Treatment: Complexity of the patient's impairments (multi-system involvement);For patient/therapist safety;To address functional/ADL transfers PT goals addressed during session: Mobility/safety with mobility;Balance;Proper use of DME         AM-PAC PT "6 Clicks" Mobility   Outcome Measure Help needed turning from your back to your side while in a flat bed without using bedrails?: None Help needed moving from lying on your back to sitting on the side of a flat bed without using bedrails?: A Little Help needed moving to and from a bed to a chair (including a wheelchair)?: A Little Help needed standing up from a chair using your arms (e.g., wheelchair or bedside chair)?: A Little Help needed to walk in hospital room?: A Little Help needed climbing 3-5 steps with a railing? : A Lot 6 Click Score: 18    End of Session Equipment Utilized During Treatment: Gait belt Activity Tolerance: Patient tolerated treatment well Patient left: in chair;with chair alarm set;with call bell/phone within reach Nurse Communication: Mobility status PT Visit Diagnosis: Difficulty in walking, not elsewhere classified (R26.2);History of falling (Z91.81)    Time: 4742-5956 PT Time Calculation (min) (ACUTE ONLY): 24 min   Charges:   PT Evaluation $PT Eval Moderate Complexity: 1 Mod   PT General Charges $$ ACUTE PT VISIT: 1 Visit         Tamala Ser PT 03/03/2023  Acute Rehabilitation Services  Office [phone]

## 2023-03-04 ENCOUNTER — Observation Stay (HOSPITAL_COMMUNITY): Payer: Medicare PPO

## 2023-03-04 DIAGNOSIS — E1122 Type 2 diabetes mellitus with diabetic chronic kidney disease: Secondary | ICD-10-CM | POA: Diagnosis present

## 2023-03-04 DIAGNOSIS — J309 Allergic rhinitis, unspecified: Secondary | ICD-10-CM | POA: Diagnosis present

## 2023-03-04 DIAGNOSIS — E1169 Type 2 diabetes mellitus with other specified complication: Secondary | ICD-10-CM

## 2023-03-04 DIAGNOSIS — I10 Essential (primary) hypertension: Secondary | ICD-10-CM | POA: Diagnosis not present

## 2023-03-04 DIAGNOSIS — G40909 Epilepsy, unspecified, not intractable, without status epilepticus: Secondary | ICD-10-CM | POA: Diagnosis present

## 2023-03-04 DIAGNOSIS — Z66 Do not resuscitate: Secondary | ICD-10-CM | POA: Diagnosis present

## 2023-03-04 DIAGNOSIS — Z794 Long term (current) use of insulin: Secondary | ICD-10-CM | POA: Diagnosis not present

## 2023-03-04 DIAGNOSIS — Z87891 Personal history of nicotine dependence: Secondary | ICD-10-CM | POA: Diagnosis not present

## 2023-03-04 DIAGNOSIS — R55 Syncope and collapse: Secondary | ICD-10-CM | POA: Diagnosis not present

## 2023-03-04 DIAGNOSIS — E1165 Type 2 diabetes mellitus with hyperglycemia: Secondary | ICD-10-CM | POA: Diagnosis present

## 2023-03-04 DIAGNOSIS — N189 Chronic kidney disease, unspecified: Secondary | ICD-10-CM | POA: Diagnosis present

## 2023-03-04 DIAGNOSIS — D631 Anemia in chronic kidney disease: Secondary | ICD-10-CM | POA: Diagnosis present

## 2023-03-04 DIAGNOSIS — R296 Repeated falls: Secondary | ICD-10-CM | POA: Diagnosis present

## 2023-03-04 DIAGNOSIS — E785 Hyperlipidemia, unspecified: Secondary | ICD-10-CM | POA: Diagnosis present

## 2023-03-04 DIAGNOSIS — G231 Progressive supranuclear ophthalmoplegia [Steele-Richardson-Olszewski]: Secondary | ICD-10-CM | POA: Diagnosis present

## 2023-03-04 DIAGNOSIS — G20A1 Parkinson's disease without dyskinesia, without mention of fluctuations: Secondary | ICD-10-CM | POA: Diagnosis present

## 2023-03-04 DIAGNOSIS — R531 Weakness: Secondary | ICD-10-CM | POA: Diagnosis present

## 2023-03-04 DIAGNOSIS — K219 Gastro-esophageal reflux disease without esophagitis: Secondary | ICD-10-CM | POA: Diagnosis present

## 2023-03-04 DIAGNOSIS — Z7984 Long term (current) use of oral hypoglycemic drugs: Secondary | ICD-10-CM | POA: Diagnosis not present

## 2023-03-04 DIAGNOSIS — Z7982 Long term (current) use of aspirin: Secondary | ICD-10-CM | POA: Diagnosis not present

## 2023-03-04 DIAGNOSIS — R569 Unspecified convulsions: Secondary | ICD-10-CM | POA: Diagnosis not present

## 2023-03-04 DIAGNOSIS — Z803 Family history of malignant neoplasm of breast: Secondary | ICD-10-CM | POA: Diagnosis not present

## 2023-03-04 DIAGNOSIS — I129 Hypertensive chronic kidney disease with stage 1 through stage 4 chronic kidney disease, or unspecified chronic kidney disease: Secondary | ICD-10-CM | POA: Diagnosis not present

## 2023-03-04 DIAGNOSIS — Z833 Family history of diabetes mellitus: Secondary | ICD-10-CM | POA: Diagnosis not present

## 2023-03-04 DIAGNOSIS — Z7902 Long term (current) use of antithrombotics/antiplatelets: Secondary | ICD-10-CM | POA: Diagnosis not present

## 2023-03-04 DIAGNOSIS — E1151 Type 2 diabetes mellitus with diabetic peripheral angiopathy without gangrene: Secondary | ICD-10-CM | POA: Diagnosis present

## 2023-03-04 DIAGNOSIS — Z79899 Other long term (current) drug therapy: Secondary | ICD-10-CM | POA: Diagnosis not present

## 2023-03-04 DIAGNOSIS — G4733 Obstructive sleep apnea (adult) (pediatric): Secondary | ICD-10-CM | POA: Diagnosis present

## 2023-03-04 LAB — GLUCOSE, CAPILLARY
Glucose-Capillary: 147 mg/dL — ABNORMAL HIGH (ref 70–99)
Glucose-Capillary: 84 mg/dL (ref 70–99)
Glucose-Capillary: 121 mg/dL — ABNORMAL HIGH (ref 70–99)
Glucose-Capillary: 105 mg/dL — ABNORMAL HIGH (ref 70–99)

## 2023-03-04 MED ORDER — DIPHENHYDRAMINE HCL 25 MG PO CAPS
25.0000 mg | ORAL_CAPSULE | Freq: Three times a day (TID) | ORAL | Status: DC | PRN
  Administered 2023-03-04: 25 mg via ORAL
  Filled 2023-03-04: qty 1

## 2023-03-04 NOTE — TOC CM/SW Note (Cosign Needed Addendum)
    Durable Medical Equipment  (From admission, onward)           Start     Ordered   03/04/23 1323  For home use only DME Bedside commode  Once       Question:  Patient needs a bedside commode to treat with the following condition  Answer:  Seizure-like activity (HCC)   03/04/23 1322           Patient is confined to one room without a bathroom

## 2023-03-04 NOTE — Progress Notes (Signed)
   03/04/23 1314  TOC Brief Assessment  Insurance and Status Reviewed ([employer] PPO)  Patient has primary care physician Yes Allyne Gee, Melina Schools, MD)  Home environment has been reviewed From Home  Prior level of function: Independent  Prior/Current Home Services No current home services  Social Determinants of Health Reivew SDOH reviewed no interventions necessary  Readmission risk has been reviewed Yes (14%)  Transition of care needs transition of care needs identified, TOC will continue to follow (Will arrange Home Healh PT and OT  BSC to be deliverded bedside)

## 2023-03-04 NOTE — Progress Notes (Signed)
PROGRESS NOTE        PATIENT DETAILS Name: April Larson Age: 72 y.o. Sex: female Date of Birth: 11-20-1950 Admit Date: 03/02/2023 Admitting Physician Steffanie Rainwater, MD AVW:[id number], Melina Schools, MD  Brief Summary: Patient is a 72 y.o.  female with history of Parkinson's disease, DM-2, HTN-who presented with possible seizure-like activity.  Significant events: 11/27>> admit to [facility]  Significant studies: 11/28>> MRI brain: No acute abnormality  Significant microbiology data: None  Procedures: None  Consults: Neurology  Subjective: Lying comfortably in bed-denies any chest pain or shortness of breath.  Objective: Vitals: Blood pressure 95/73, pulse 88, temperature 97.7 F (36.5 C), temperature source Oral, resp. rate 18, height 5\' 2"  (1.575 m), weight 63.5 kg, SpO2 100%.   Exam: Gen Exam:Alert awake-not in any distress HEENT:atraumatic, normocephalic Chest: B/L clear to auscultation anteriorly CVS:S1S2 regular Abdomen:soft non tender, non distended Extremities:no edema Neurology: Non focal Skin: no rash  Pertinent Labs/Radiology:    Latest Ref Rng & Units 03/03/2023    6:22 AM 03/02/2023    2:50 PM 11/24/2022   10:15 AM  CBC  WBC 4.0 - 10.5 K/uL 6.9  10.1  6.5   Hemoglobin 12.0 - 15.0 g/dL 47.8  29.5  62.1   Hematocrit 36.0 - 46.0 % 35.4  36.5  36.2   Platelets 150 - 400 K/uL 357  382  346     Lab Results  Component Value Date   NA 135 03/03/2023   K 3.7 03/03/2023   CL 102 03/03/2023   CO2 24 03/03/2023     Assessment/Plan: Possible seizure-like activity Unclear whether this is related to orthostatic mechanism associated with parkinsonism versus seizure LTM EEG in progress Currently not on any AEDs Neurology following-await further recommendations.  Atypical Parkinson's disease/supranuclear palsy Sinemet/rivastigmine Resume outpatient follow-up with neurology  DM-2 CBG stable Hold oral  hypoglycemics SSI  Recent Labs    03/03/23 1651 03/03/23 2142 03/04/23 0755  GLUCAP 94 108* 147*     HTN BP stable Continue Avapro  HLD Statin  PAD Appears to be on DAPT with aspirin/Plavix Statin  OSA CPAP  Debility/deconditioning Walks with the help of a walker PT/OT eval  BMI: Estimated body mass index is 25.61 kg/m as calculated from the following:   Height as of this encounter: 5\' 2"  (1.575 m).   Weight as of this encounter: 63.5 kg.   Code status:   Code Status: Limited: Do not attempt resuscitation (DNR) -DNR-LIMITED -Do Not Intubate/DNI    DVT Prophylaxis: enoxaparin (LOVENOX) injection 40 mg Start: 03/02/23 2200   Family Communication: None at bedside   Disposition Plan: Status is: Observation The patient will require care spanning > 2 midnights and should be moved to inpatient because: Severity of illness   Planned Discharge Destination:Home   Diet: Diet Order             Diet Carb Modified Fluid consistency: Thin; Room service appropriate? Yes  Diet effective now                     Antimicrobial agents: Anti-infectives (From admission, onward)    None        MEDICATIONS: Scheduled Meds:  acetaminophen  1,000 mg Oral QHS   ascorbic acid  500 mg Oral Daily   aspirin EC  81 mg Oral Daily   atorvastatin  10 mg Oral Daily   calcium carbonate  1,250 mg Oral Daily   carbidopa-levodopa  1 tablet Oral QID   cholecalciferol  5,000 Units Oral Daily   clopidogrel  75 mg Oral q1600   diclofenac Sodium  2 g Topical BID   enoxaparin (LOVENOX) injection  40 mg Subcutaneous Q24H   fluticasone  2 spray Each Nare Daily   insulin aspart  0-15 Units Subcutaneous TID WC   irbesartan  150 mg Oral Daily   loratadine  10 mg Oral Daily   magnesium oxide  200 mg Oral QHS   metFORMIN  500 mg Oral q1600   multivitamin with minerals  1 tablet Oral Daily   omega-3 acid ethyl esters  1 g Oral Daily   pantoprazole  40 mg Oral Daily    rivastigmine  1.5 mg Oral BID   Continuous Infusions: PRN Meds:.acetaminophen **OR** acetaminophen, ondansetron **OR** ondansetron (ZOFRAN) IV, mouth rinse, senna-docusate   I have personally reviewed following labs and imaging studies  LABORATORY DATA: CBC: Recent Labs  Lab 03/02/23 1450 03/03/23 0622  WBC 10.1 6.9  NEUTROABS 8.1*  --   HGB 11.8* 11.2*  HCT 36.5 35.4*  MCV 89.2 88.5  PLT 382 357    Basic Metabolic Panel: Recent Labs  Lab 03/02/23 1450 03/02/23 2057 03/03/23 0622  NA 138  --  135  K 4.0  --  3.7  CL 104  --  102  CO2 26  --  24  GLUCOSE 92  --  86  BUN 27*  --  24*  CREATININE 0.79  --  0.85  CALCIUM 9.8  --  9.5  MG  --  2.1  --   PHOS  --  3.1  --     GFR: Estimated Creatinine Clearance: 53.2 mL/min (by C-G formula based on SCr of 0.85 mg/dL).  Liver Function Tests: Recent Labs  Lab 03/02/23 2057  AST 12*  ALT 8  ALKPHOS 110  BILITOT 0.5  PROT 7.5  ALBUMIN 4.0   No results for input(s): "LIPASE", "AMYLASE" in the last 168 hours. No results for input(s): "AMMONIA" in the last 168 hours.  Coagulation Profile: No results for input(s): "INR", "PROTIME" in the last 168 hours.  Cardiac Enzymes: No results for input(s): "CKTOTAL", "CKMB", "CKMBINDEX", "TROPONINI" in the last 168 hours.  BNP (last 3 results) No results for input(s): "PROBNP" in the last 8760 hours.  Lipid Profile: No results for input(s): "CHOL", "HDL", "LDLCALC", "TRIG", "CHOLHDL", "LDLDIRECT" in the last 72 hours.  Thyroid Function Tests: Recent Labs    03/02/23 2057  TSH 1.062    Anemia Panel: Recent Labs    03/02/23 2057  VITAMINB12 772    Urine analysis:    Component Value Date/Time   COLORURINE YELLOW 03/02/2023 1705   APPEARANCEUR CLEAR 03/02/2023 1705   LABSPEC 1.017 03/02/2023 1705   PHURINE 5.0 03/02/2023 1705   GLUCOSEU NEGATIVE 03/02/2023 1705   HGBUR NEGATIVE 03/02/2023 1705   BILIRUBINUR NEGATIVE 03/02/2023 1705   BILIRUBINUR  negative 11/17/2021 1613   KETONESUR 5 (A) 03/02/2023 1705   PROTEINUR NEGATIVE 03/02/2023 1705   UROBILINOGEN 0.2 11/17/2021 1613   UROBILINOGEN 0.2 07/11/2010 1738   NITRITE NEGATIVE 03/02/2023 1705   LEUKOCYTESUR MODERATE (A) 03/02/2023 1705    Sepsis Labs: Lactic Acid, Venous No results found for: "LATICACIDVEN"  MICROBIOLOGY: No results found for this or any previous visit (from the past 240 hour(s)).  RADIOLOGY STUDIES/RESULTS: MR BRAIN W WO CONTRAST  Result Date:  03/03/2023 CLINICAL DATA:  Seizure, new onset.  Syncope/presyncope. EXAM: MRI HEAD WITHOUT AND WITH CONTRAST TECHNIQUE: Multiplanar, multiecho pulse sequences of the brain and surrounding structures were obtained without and with intravenous contrast. CONTRAST:  7mL GADAVIST GADOBUTROL 1 MMOL/ML IV SOLN COMPARISON:  MR head without and with contrast 06/07/2020 FINDINGS: Brain: Mild atrophy and scattered white matter changes are stable bilaterally No acute infarct, hemorrhage, or mass lesion is present. The ventricles are proportionate to the degree of atrophy. No significant extraaxial fluid collection is present. The brainstem and cerebellum are within normal limits. The internal auditory canals are within normal limits. Midline structures are within normal limits. Dedicated imaging of the temporal lobes demonstrates symmetric size and signal of the hippocampal structures. Postcontrast images demonstrate no pathologic enhancement. Vascular: Flow is present in the major intracranial arteries. Skull and upper cervical spine: The craniocervical junction is normal. Upper cervical spine is within normal limits. Marrow signal is unremarkable. Sinuses/Orbits: The paranasal sinuses and mastoid air cells are clear. Bilateral lens replacements are noted. Globes and orbits are otherwise unremarkable. IMPRESSION: 1. No acute intracranial abnormality or significant interval change. 2. Stable mild atrophy and scattered white matter disease.  This likely reflects the sequela of chronic microvascular ischemia. Electronically Signed   By: Marin Roberts M.D.   On: 03/03/2023 16:56   Overnight EEG with video  Result Date: 03/03/2023 Charlsie Quest, MD     03/04/2023  9:22 AM Patient Name: Baila Vilikia Watkins Larson MRN: [id number] Epilepsy Attending: Charlsie Quest Referring Physician/Provider: Kathlen Mody, MD  Duration: 03/04/2023 0510 to 03/04/2023 0920 Patient history: 60 yoF with seizure like activity getting eeg to evaluate for seizure Level of alertness: Awake,  asleep AEDs during EEG study: None Technical aspects: This EEG study was done with scalp electrodes positioned according to the 10-20 International system of electrode placement. Electrical activity was reviewed with band pass filter of 1-70Hz , sensitivity of 7 uV/mm, display speed of 51mm/sec with a 60Hz  notched filter applied as appropriate. EEG data were recorded continuously and digitally stored.  Video monitoring was available and reviewed as appropriate. Description: The posterior dominant rhythm consists of 8-9 Hz activity of moderate voltage (25-35 uV) seen predominantly in posterior head regions, symmetric and reactive to eye opening and eye closing. Sleep was characterized by vertex waves, sleep spindles (12 to 14 Hz), maximal frontocentral region. Hyperventilation and photic stimulation were not performed.   IMPRESSION: This study is within normal limits. No seizures or epileptiform discharges were seen throughout the recording. A normal interictal EEG does not exclude the diagnosis of epilepsy. Charlsie Quest   CT Head Wo Contrast  Result Date: 03/02/2023 CLINICAL DATA:  Syncope/presyncope, cerebrovascular cause suspected. EXAM: CT HEAD WITHOUT CONTRAST TECHNIQUE: Contiguous axial images were obtained from the base of the skull through the vertex without intravenous contrast. RADIATION DOSE REDUCTION: This exam was performed according to the departmental  dose-optimization program which includes automated exposure control, adjustment of the mA and/or kV according to patient size and/or use of iterative reconstruction technique. COMPARISON:  Head CT 03/27/2020 and MRI 06/07/2020 FINDINGS: Brain: There is no evidence of an acute infarct, intracranial hemorrhage, mass, midline shift, or extra-axial fluid collection. Mild cerebral atrophy is within normal limits for age. Cerebral white matter hypodensities are nonspecific but compatible with minimal chronic small vessel ischemic disease. Vascular: Calcified atherosclerosis at the skull base. No hyperdense vessel. Skull: No acute fracture or suspicious osseous lesion. Sinuses/Orbits: Visualized paranasal sinuses and mastoid air cells are clear.  Bilateral cataract extraction. Other: None. IMPRESSION: No evidence of acute intracranial abnormality. Electronically Signed   By: Sebastian Ache M.D.   On: 03/02/2023 16:17     LOS: 0 days   Jeoffrey Massed, MD  [facility]    To contact the attending provider between 7A-7P or the covering provider during after hours 7P-7A, please log into the web site [email or web address] and access using universal [place] password for that web site. If you do not have the password, please call the hospital operator.  03/04/2023, 9:25 AM

## 2023-03-04 NOTE — Progress Notes (Signed)
NEUROLOGY CONSULT FOLLOW UP NOTE   Date of service: March 04, 2023 Patient Name: April Larson MRN:  [id number] DOB:  1950-06-09  Brief HPI  April Larson is a 72 y.o. female  has a past medical history of Diabetes mellitus without complication (HCC), Hypertension, Parkinson disease (HCC), and Substance abuse (HCC). who presented with multiple episodes of staring off into space which happen with no warning.  Patient states that she has no memory of these episodes and just slumped over and stares off into space and is confused for a little while afterwards.  She does have some mild memory impairment at baseline and is on Exelon, but appears to be able to discuss her medical condition with reasonable accuracy.  While Exelon can cause lightheadedness and falls, start of this medication does not correlate with onset of symptoms.  Patient has been connected to long-term EEG for spell capture.   Interval Hx/subjective   Patient has been connected to long-term EEG and reports that she has had no episodes since it has been hooked up.  Vitals   Vitals:   03/03/23 2304 03/04/23 0000 03/04/23 0400 03/04/23 0800  BP:  119/73 116/70 95/73  Pulse: 80 70 65 88  Resp: 15 14 13 18   Temp:  97.7 F (36.5 C) 97.6 F (36.4 C) 97.7 F (36.5 C)  TempSrc:  Oral Oral Oral  SpO2:  100% 100% 100%  Weight:      Height:         Body mass index is 25.61 kg/m.  Physical Exam   Constitutional: Well-developed, well-nourished elderly patient in no acute distress Psych: Affect appropriate to situation.  Eyes: No scleral injection.  HENT: No OP obstrucion.  Head: Normocephalic.  Cardiovascular: Normal rate and regular rhythm.  Respiratory: Effort normal, non-labored breathing.  Skin: WDI.   Neurologic Examination    NEURO:  Mental Status: AA&Ox3  Speech/Language: speech is with mild dysarthria as patient does not have her dentures in  Cranial Nerves:  II: PERRL.   III, IV, VI: Eyes midline and do not move, patient states this is baseline V: Sensation is intact to light touch and symmetrical to face.  VII: Smile is symmetrical.  VIII: hearing intact to voice. IX, X: Phonation is normal.  GM:[id number] shrug 5/5. XII: tongue is midline without fasciculations. Motor: 5/5 strength to all muscle groups tested.  Tone: is normal and bulk is normal Sensation- Intact to light touch bilaterally.  Coordination: FTN intact bilaterally.No drift.  Gait- deferred  Medications  Current Facility-Administered Medications:    acetaminophen (TYLENOL) tablet 650 mg, 650 mg, Oral, Q6H PRN **OR** acetaminophen (TYLENOL) suppository 650 mg, 650 mg, Rectal, Q6H PRN, Kathlen Mody, MD   acetaminophen (TYLENOL) tablet 1,000 mg, 1,000 mg, Oral, QHS, Akula, Vijaya, MD, 1,000 mg at 03/03/23 2102   ascorbic acid (VITAMIN C) tablet 500 mg, 500 mg, Oral, Daily, Blake Divine, Vijaya, MD, 500 mg at 03/04/23 6644   aspirin EC tablet 81 mg, 81 mg, Oral, Daily, Kathlen Mody, MD, 81 mg at 03/04/23 0814   atorvastatin (LIPITOR) tablet 10 mg, 10 mg, Oral, Daily, Blake Divine, Vijaya, MD, 10 mg at 03/04/23 0814   calcium carbonate (OS-CAL - dosed in mg of elemental calcium) tablet 1,250 mg, 1,250 mg, Oral, Daily, Blake Divine, Vijaya, MD, 1,250 mg at 03/04/23 0347   carbidopa-levodopa (SINEMET IR) 25-100 MG per tablet immediate release 1 tablet, 1 tablet, Oral, QID, Kathlen Mody, MD, 1 tablet at 03/04/23 0813   cholecalciferol (VITAMIN D3)  25 MCG (1000 UNIT) tablet 5,000 Units, 5,000 Units, Oral, Daily, Kathlen Mody, MD, 5,000 Units at 03/04/23 0815   clopidogrel (PLAVIX) tablet 75 mg, 75 mg, Oral, q1600, Kathlen Mody, MD, 75 mg at 03/03/23 1723   diclofenac Sodium (VOLTAREN) 1 % topical gel 2 g, 2 g, Topical, BID, Kathlen Mody, MD, 2 g at 03/03/23 2103   enoxaparin (LOVENOX) injection 40 mg, 40 mg, Subcutaneous, Q24H, Kathlen Mody, MD, 40 mg at 03/03/23 2102   fluticasone (FLONASE) 50 MCG/ACT nasal spray  2 spray, 2 spray, Each Nare, Daily, Kathlen Mody, MD, 2 spray at 03/03/23 1016   insulin aspart (novoLOG) injection 0-15 Units, 0-15 Units, Subcutaneous, TID WC, Kathlen Mody, MD, 2 Units at 03/04/23 0811   irbesartan (AVAPRO) tablet 150 mg, 150 mg, Oral, Daily, 150 mg at 03/03/23 1015 **AND** [DISCONTINUED] hydrochlorothiazide (HYDRODIURIL) tablet 12.5 mg, 12.5 mg, Oral, Daily, [place], Flossie Buffy, MD   loratadine (CLARITIN) tablet 10 mg, 10 mg, Oral, Daily, Kathlen Mody, MD, 10 mg at 03/04/23 0813   magnesium oxide (MAG-OX) tablet 200 mg, 200 mg, Oral, QHS, Kathlen Mody, MD, 200 mg at 03/03/23 2103   metFORMIN (GLUCOPHAGE) tablet 500 mg, 500 mg, Oral, q1600, Kathlen Mody, MD, 500 mg at 03/03/23 1723   multivitamin with minerals tablet 1 tablet, 1 tablet, Oral, Daily, Kathlen Mody, MD, 1 tablet at 03/04/23 0813   omega-3 acid ethyl esters (LOVAZA) capsule 1 g, 1 g, Oral, Daily, Kathlen Mody, MD, 1 g at 03/04/23 0812   ondansetron (ZOFRAN) tablet 4 mg, 4 mg, Oral, Q6H PRN **OR** ondansetron (ZOFRAN) injection 4 mg, 4 mg, Intravenous, Q6H PRN, Kathlen Mody, MD   Oral care mouth rinse, 15 mL, Mouth Rinse, PRN, Kathlen Mody, MD   pantoprazole (PROTONIX) EC tablet 40 mg, 40 mg, Oral, Daily, Kathlen Mody, MD, 40 mg at 03/04/23 8657   rivastigmine (EXELON) capsule 1.5 mg, 1.5 mg, Oral, BID, Kathlen Mody, MD, 1.5 mg at 03/04/23 0815   senna-docusate (Senokot-S) tablet 1 tablet, 1 tablet, Oral, QHS PRN, Kathlen Mody, MD Labs and Diagnostic Imaging   CBC:  Recent Labs  Lab 03/02/23 1450 03/03/23 0622  WBC 10.1 6.9  NEUTROABS 8.1*  --   HGB 11.8* 11.2*  HCT 36.5 35.4*  MCV 89.2 88.5  PLT 382 357    Basic Metabolic Panel:  Lab Results  Component Value Date   NA 135 03/03/2023   K 3.7 03/03/2023   CO2 24 03/03/2023   GLUCOSE 86 03/03/2023   BUN 24 (H) 03/03/2023   CREATININE 0.85 03/03/2023   CALCIUM 9.5 03/03/2023   GFRNONAA >60 03/03/2023   GFRAA 75 04/15/2020   Lipid  Panel:  Lab Results  Component Value Date   LDLCALC 73 11/24/2022   HgbA1c:  Lab Results  Component Value Date   HGBA1C 6.5 (H) 03/02/2023   Urine Drug Screen:     Component Value Date/Time   LABOPIA NONE DETECTED 03/02/2023 1705   COCAINSCRNUR NONE DETECTED 03/02/2023 1705   LABBENZ NONE DETECTED 03/02/2023 1705   AMPHETMU NONE DETECTED 03/02/2023 1705   THCU NONE DETECTED 03/02/2023 1705   LABBARB NONE DETECTED 03/02/2023 1705    Alcohol Level No results found for: "ETH" INR No results found for: "INR" APTT No results found for: "APTT" AED levels: No results found for: "PHENYTOIN", "ZONISAMIDE", "LAMOTRIGINE", "LEVETIRACETA"  CT Head without contrast(Personally reviewed): No acute abnormality  MRI Brain(Personally reviewed): No acute abnormality, atrophy and white matter disease  Continuous EEG started on 11/29, results pending  Assessment   April Larson is a 72 y.o. female with history of Parkinson's disease, diabetes, hypertension, sleep apnea on CPAP and peripheral vascular disease who presents with multiple episodes of staring off into space and being on interactive.  Episodes occur without warning and patient has no memory of what goes on during the episode, but she does feel little confused and foggy afterwards.  She states she is not aware of when the episodes happen and that her brother typically points them out for her.  She has been connected to long-term EEG for spell capture, awaiting results.  Patient has not had an episode since the long-term EEG has been connected.  Recommendations  -Continue EEG monitoring -Will hold off on AEDs, will start if seizure activity seen on EEG -Continue home statin med and rivastigmine -Neurology will continue to follow ______________________________________________________________________  Patient seen by NP and then by MD, MD to edit note as needed.  Signed, Cortney E Ernestina Columbia, NP [facility]   NEUROHOSPITALIST ADDENDUM Performed a face to face diagnostic evaluation.   I have reviewed the contents of history and physical exam as documented by PA/ARNP/Resident and agree with above documentation.  I have discussed and formulated the above plan as documented. Edits to the note have been made as needed.  Impression/Key exam findings/Plan: no seizures on LTM so far but also has not had a spell yet. Will leave her on LTM overnight.  Erick Blinks, MD [facility] [phone]   If 7pm to 7am, please call on call as listed on AMION.

## 2023-03-04 NOTE — Plan of Care (Signed)
  Problem: Education: Goal: Ability to describe self-care measures that may prevent or decrease complications (Diabetes Survival Skills Education) will improve Outcome: Progressing   Problem: Skin Integrity: Goal: Risk for impaired skin integrity will decrease Outcome: Progressing   Problem: Pain Management: Goal: General experience of comfort will improve Outcome: Progressing

## 2023-03-04 NOTE — Progress Notes (Signed)
Mobility Specialist Progress Note;   03/04/23 1000  Mobility  Activity Transferred from bed to chair  Level of Assistance Minimal assist, patient does 75% or more  Assistive Device Front wheel walker  Activity Response Tolerated well  Mobility Referral Yes  $Mobility charge 1 Mobility  Mobility Specialist Start Time (ACUTE ONLY) 1000  Mobility Specialist Stop Time (ACUTE ONLY) 1020  Mobility Specialist Time Calculation (min) (ACUTE ONLY) 20 min   Pt agreeable to mobility. Required MinG assistance for bed mobility and during transfer from bed to chair. Slight cognitive decline requiring min verbal cues for bed mobility and directional advancement. VSS throughout and no c/o. Pt left in chair with all needs met, alarm on. Tele in room.   Caesar Bookman Mobility Specialist Please contact via SecureChat or Rehab Office [phone]

## 2023-03-04 NOTE — Progress Notes (Signed)
LTM EEG hooked up and running - no initial skin breakdown - push button tested - [facility] monitoring.  

## 2023-03-04 NOTE — TOC Progression Note (Signed)
Transition of Care [facility]) - Progression Note    Patient Details  Name: April Larson MRN: [id number] Date of Birth: 1950-06-28  Transition of Care [facility]) CM/SW Contact  Gordy Clement, RN Phone Number: 03/04/2023, 1:24 PM  Clinical Narrative:     [facility] will be providing HH for this patient and bedside commode to be delivered bedside by [facility]  TOC will continue to follow patient for any additional discharge needs           Expected Discharge Plan and Services                                               Social Determinants of Health (SDOH) Interventions SDOH Screenings   Food Insecurity: No Food Insecurity (03/02/2023)  Housing: Low Risk  (03/02/2023)  Transportation Needs: No Transportation Needs (03/02/2023)  Utilities: Not At Risk (03/02/2023)  Depression (PHQ2-9): Low Risk  (08/04/2022)  Financial Resource Strain: Low Risk  (08/04/2022)  Physical Activity: Inactive (08/04/2022)  Stress: No Stress Concern Present (08/04/2022)  Tobacco Use: Medium Risk (03/02/2023)    Readmission Risk Interventions     No data to display

## 2023-03-05 DIAGNOSIS — R569 Unspecified convulsions: Secondary | ICD-10-CM | POA: Diagnosis not present

## 2023-03-05 LAB — GLUCOSE, CAPILLARY
Glucose-Capillary: 239 mg/dL — ABNORMAL HIGH (ref 70–99)
Glucose-Capillary: 53 mg/dL — ABNORMAL LOW (ref 70–99)
Glucose-Capillary: 113 mg/dL — ABNORMAL HIGH (ref 70–99)
Glucose-Capillary: 150 mg/dL — ABNORMAL HIGH (ref 70–99)
Glucose-Capillary: 133 mg/dL — ABNORMAL HIGH (ref 70–99)

## 2023-03-05 MED ORDER — SODIUM CHLORIDE 0.9 % IV SOLN: INTRAVENOUS | Status: DC

## 2023-03-05 NOTE — Progress Notes (Signed)
NEUROLOGY CONSULT FOLLOW UP NOTE   Date of service: March 05, 2023 Patient Name: April Larson MRN:  [id number] DOB:  1950-12-31  Brief HPI  From prior note:  "April Larson is a 72 y.o. female  has a past medical history of Diabetes mellitus without complication (HCC), Hypertension, Parkinson disease (HCC), and Substance abuse (HCC). who presented with multiple episodes of staring off into space which happen with no warning.  Patient states that she has no memory of these episodes and just slumped over and stares off into space and is confused for a little while afterwards.  She does have some mild memory impairment at baseline and is on Exelon, but appears to be able to discuss her medical condition with reasonable accuracy.  While Exelon can cause lightheadedness and falls, start of this medication does not correlate with onset of symptoms.  Patient has been connected to long-term EEG for spell capture."   Interval Hx/subjective   Patient awake in bed.  Remains on LTM.  Patient denies any episodes overnight.  Denies any new concerns.  Vitals   Vitals:   03/04/23 2353 03/05/23 0400 03/05/23 0912 03/05/23 1215  BP: 127/66 135/71 (!) 140/72 93/64  Pulse: 67 75 87 80  Resp: 15 18 16 18   Temp: 97.7 F (36.5 C) 98 F (36.7 C) 97.8 F (36.6 C) 97.6 F (36.4 C)  TempSrc: Oral Oral Oral Axillary  SpO2:   98% 94%  Weight:      Height:         Body mass index is 25.61 kg/m.  Physical Exam  Vitals: 1215 temp 97.6, HR 73, bp 93/64, rr 18 Constitutional: pleasant female in no acute distress. Psych: mood and affect appear normal. HEENT: [place]/AT Cardiovascular: Normal rate  Respiratory: Effort normal, non-labored breathing.  Skin: warm and dry, no rash  Neurologic Examination   NEURO:  Mental Status: AA&Ox3  Speech/Language: speech intact, trace dysarthria due to lack of dentures.  Cranial Nerves:  II: PERRL.  III, IV, VI: Eyes midline and do  not move, patient states this is baseline V: Sensation is intact to light touch and symmetrical to face.  VII: Smile symmetric VIII: hearing intact to voice IX, X: Phonation is normal XII: tongue is midline without fasciculations. Motor: 5/5 strength to all muscle groups tested.  Tone: is normal and bulk is normal Sensation- Intact to light touch bilaterally.  Gait- deferred  Medications  Current Facility-Administered Medications:    acetaminophen (TYLENOL) tablet 650 mg, 650 mg, Oral, Q6H PRN **OR** acetaminophen (TYLENOL) suppository 650 mg, 650 mg, Rectal, Q6H PRN, Kathlen Mody, MD   acetaminophen (TYLENOL) tablet 1,000 mg, 1,000 mg, Oral, QHS, Akula, Vijaya, MD, 1,000 mg at 03/04/23 2140   ascorbic acid (VITAMIN C) tablet 500 mg, 500 mg, Oral, Daily, Blake Divine, Vijaya, MD, 500 mg at 03/05/23 0932   aspirin EC tablet 81 mg, 81 mg, Oral, Daily, Blake Divine, Vijaya, MD, 81 mg at 03/05/23 0932   atorvastatin (LIPITOR) tablet 10 mg, 10 mg, Oral, Daily, Blake Divine, Vijaya, MD, 10 mg at 03/05/23 0933   calcium carbonate (OS-CAL - dosed in mg of elemental calcium) tablet 1,250 mg, 1,250 mg, Oral, Daily, Blake Divine, Vijaya, MD, 1,250 mg at 03/05/23 0933   carbidopa-levodopa (SINEMET IR) 25-100 MG per tablet immediate release 1 tablet, 1 tablet, Oral, QID, Kathlen Mody, MD, 1 tablet at 03/05/23 5366   cholecalciferol (VITAMIN D3) 25 MCG (1000 UNIT) tablet 5,000 Units, 5,000 Units, Oral, Daily, Kathlen Mody, MD, 5,000  Units at 03/05/23 0933   clopidogrel (PLAVIX) tablet 75 mg, 75 mg, Oral, q1600, Kathlen Mody, MD, 75 mg at 03/04/23 1523   diclofenac Sodium (VOLTAREN) 1 % topical gel 2 g, 2 g, Topical, BID, Kathlen Mody, MD, 2 g at 03/05/23 0935   diphenhydrAMINE (BENADRYL) capsule 25 mg, 25 mg, Oral, Q8H PRN, Maretta Bees, MD, 25 mg at 03/04/23 1707   enoxaparin (LOVENOX) injection 40 mg, 40 mg, Subcutaneous, Q24H, Kathlen Mody, MD, 40 mg at 03/04/23 2140   fluticasone (FLONASE) 50 MCG/ACT nasal spray 2  spray, 2 spray, Each Nare, Daily, Kathlen Mody, MD, 2 spray at 03/05/23 0935   insulin aspart (novoLOG) injection 0-15 Units, 0-15 Units, Subcutaneous, TID WC, Kathlen Mody, MD, 5 Units at 03/05/23 0932   irbesartan (AVAPRO) tablet 150 mg, 150 mg, Oral, Daily, 150 mg at 03/05/23 0933 **AND** [DISCONTINUED] hydrochlorothiazide (HYDRODIURIL) tablet 12.5 mg, 12.5 mg, Oral, Daily, Kirke Corin, Flossie Buffy, MD   loratadine (CLARITIN) tablet 10 mg, 10 mg, Oral, Daily, Kathlen Mody, MD, 10 mg at 03/05/23 1610   magnesium oxide (MAG-OX) tablet 200 mg, 200 mg, Oral, QHS, Kathlen Mody, MD, 200 mg at 03/04/23 2140   multivitamin with minerals tablet 1 tablet, 1 tablet, Oral, Daily, Kathlen Mody, MD, 1 tablet at 03/05/23 9604   omega-3 acid ethyl esters (LOVAZA) capsule 1 g, 1 g, Oral, Daily, Kathlen Mody, MD, 1 g at 03/05/23 0933   ondansetron (ZOFRAN) tablet 4 mg, 4 mg, Oral, Q6H PRN **OR** ondansetron (ZOFRAN) injection 4 mg, 4 mg, Intravenous, Q6H PRN, Kathlen Mody, MD   Oral care mouth rinse, 15 mL, Mouth Rinse, PRN, Kathlen Mody, MD   pantoprazole (PROTONIX) EC tablet 40 mg, 40 mg, Oral, Daily, Kathlen Mody, MD, 40 mg at 03/05/23 0933   rivastigmine (EXELON) capsule 1.5 mg, 1.5 mg, Oral, BID, Kathlen Mody, MD, 1.5 mg at 03/05/23 0933   senna-docusate (Senokot-S) tablet 1 tablet, 1 tablet, Oral, QHS PRN, Kathlen Mody, MD Labs and Diagnostic Imaging   CBC    Component Value Date/Time   WBC 6.9 03/03/2023 0622   RBC 4.00 03/03/2023 0622   HGB 11.2 (L) 03/03/2023 0622   HGB 12.1 11/24/2022 1015   HCT 35.4 (L) 03/03/2023 0622   HCT 36.2 11/24/2022 1015   PLT 357 03/03/2023 0622   PLT 346 11/24/2022 1015   MCV 88.5 03/03/2023 0622   MCV 84 11/24/2022 1015   MCH 28.0 03/03/2023 0622   MCHC 31.6 03/03/2023 0622   RDW 14.6 03/03/2023 0622   RDW 14.0 11/24/2022 1015   LYMPHSABS 1.4 03/02/2023 1450   MONOABS 0.5 03/02/2023 1450   EOSABS 0.0 03/02/2023 1450   BASOSABS 0.1 03/02/2023 1450        Latest Ref Rng & Units 03/03/2023    6:22 AM 03/02/2023    2:50 PM 11/24/2022   10:15 AM  BMP  Glucose 70 - 99 mg/dL 86  92  40   BUN 8 - 23 mg/dL 24  27  18    Creatinine 0.44 - 1.00 mg/dL 5.40  9.81  1.91   BUN/Creat Ratio 12 - 28   23   Sodium 135 - 145 mmol/L 135  138  143   Potassium 3.5 - 5.1 mmol/L 3.7  4.0  4.3   Chloride 98 - 111 mmol/L 102  104  104   CO2 22 - 32 mmol/L 24  26  26    Calcium 8.9 - 10.3 mg/dL 9.5  9.8  9.8  Lipid Panel:  Lab Results  Component Value Date   LDLCALC 73 11/24/2022   HgbA1c:  Lab Results  Component Value Date   HGBA1C 6.5 (H) 03/02/2023   Urine Drug Screen:     Component Value Date/Time   LABOPIA NONE DETECTED 03/02/2023 1705   COCAINSCRNUR NONE DETECTED 03/02/2023 1705   LABBENZ NONE DETECTED 03/02/2023 1705   AMPHETMU NONE DETECTED 03/02/2023 1705   THCU NONE DETECTED 03/02/2023 1705   LABBARB NONE DETECTED 03/02/2023 1705    Alcohol Level No results found for: "ETH" INR No results found for: "INR" APTT No results found for: "APTT" AED levels: No results found for: "PHENYTOIN", "ZONISAMIDE", "LAMOTRIGINE", "LEVETIRACETA"  CT Head without contrast(Personally reviewed): No acute abnormality  MRI Brain(Personally reviewed): No acute abnormality, atrophy and white matter disease  Continuous EEG started on 11/30- WNL  Assessment   April Larson is a 72 y.o. female with history of Parkinson's disease, diabetes, hypertension, sleep apnea on CPAP and peripheral vascular disease who presents with multiple episodes of staring off into space and being on interactive.  Episodes occur without warning and patient has no memory of what goes on during the episode, but she does feel little confused and foggy afterwards.  She states she is not aware of when the episodes happen and that her brother typically points them out for her.  She has been connected to long-term EEG for spell capture, no seizures noted. Patient  has not had an episode since the long-term EEG has been connected.  Recommendations  - discontinue LTM - no "spells" and nothing noted on EEG - Continue home statin med and rivastigmine. No concern at this time necessitating AED initiation. - Neurology will be available if questions arise.  - ok to discharge from our perspective ______________________________________________________________________  Patient seen by NP and then by MD, MD to edit note as needed.  Wynell Balloon [facility]    NEUROHOSPITALIST ADDENDUM Performed a face to face diagnostic evaluation.   I have reviewed the contents of history and physical exam as documented by PA/ARNP/Resident and agree with above documentation.  I have discussed and formulated the above plan as documented. Edits to the note have been made as needed.  Impression/Key exam findings/Plan: no spells captured despite over 40 hours on LTM. No epileptiform discharges noted on LTM. Will take her off LTM and hold off on initiating AEDs at this time. No further inpatient neurologic recommendations at this time. We will signoff. She should follow up with outpatient neurology.  Dr. Randol Kern was sent a secure chat message to make him aware.  Erick Blinks, MD [facility] [phone]   If 7pm to 7am, please call on call as listed on AMION.

## 2023-03-05 NOTE — Progress Notes (Signed)
PROGRESS NOTE        PATIENT DETAILS Name: April Larson Age: 72 y.o. Sex: female Date of Birth: 08/25/1950 Admit Date: 03/02/2023 Admitting Physician Dewayne Shorter Levora Dredge, MD RUE:[id number], Melina Schools, MD  Brief Summary: Patient is a 72 y.o.  female with history of Parkinson's disease, DM-2, HTN-who presented with possible seizure-like activity.  Significant events: 11/27>> admit to [facility]  Significant studies: 11/28>> MRI brain: No acute abnormality  Significant microbiology data: None  Procedures: None  Consults: Neurology  Subjective: No significant events overnight, she denies any complaints today, no nausea, no vomiting, Objective: Vitals: Blood pressure 93/64, pulse 80, temperature 97.6 F (36.4 C), temperature source Axillary, resp. rate 18, height 5\' 2"  (1.575 m), weight 63.5 kg, SpO2 94%.   Exam: Awake Alert, Oriented X 3, No new F.N deficits, Normal affect Symmetrical Chest wall movement, Good air movement bilaterally, CTAB RRR,No Gallops,Rubs or new Murmurs, No Parasternal Heave +ve B.Sounds, Abd Soft, No tenderness, No rebound - guarding or rigidity. No Cyanosis, Clubbing or edema, No new Rash or bruise    Pertinent Labs/Radiology:    Latest Ref Rng & Units 03/03/2023    6:22 AM 03/02/2023    2:50 PM 11/24/2022   10:15 AM  CBC  WBC 4.0 - 10.5 K/uL 6.9  10.1  6.5   Hemoglobin 12.0 - 15.0 g/dL 19.1  47.8  29.5   Hematocrit 36.0 - 46.0 % 35.4  36.5  36.2   Platelets 150 - 400 K/uL 357  382  346     Lab Results  Component Value Date   NA 135 03/03/2023   K 3.7 03/03/2023   CL 102 03/03/2023   CO2 24 03/03/2023     Assessment/Plan:  Spells, concern for seizures Unclear whether this is related to orthostatic mechanism associated with parkinsonism versus seizure Neurology input greatly appreciated, patient was on LTM EEG, there was no spells, and no concerns of elliptic form activity on her EEG, no indication for  AED, no further workup regarding seizures per neurology. -Unclear if this is related to orthostasis, he has a few soft readings, I will keep on IV fluids, will check a.m. cortisol level, will keep on TED hose  Atypical Parkinson's disease/supranuclear palsy Sinemet/rivastigmine Resume outpatient follow-up with neurology  DM-2 CBG stable Hold oral hypoglycemics SSI  Recent Labs    03/05/23 0914 03/05/23 1218 03/05/23 1315  GLUCAP 239* 53* 113*     HTN See above discussion regarding possible orthostasis, continue with IV fluids, and TED hose.  HLD Statin  PAD Appears to be on DAPT with aspirin/Plavix Statin  OSA CPAP  Debility/deconditioning Walks with the help of a walker PT/OT eval  BMI: Estimated body mass index is 25.61 kg/m as calculated from the following:   Height as of this encounter: 5\' 2"  (1.575 m).   Weight as of this encounter: 63.5 kg.   Code status:   Code Status: Limited: Do not attempt resuscitation (DNR) -DNR-LIMITED -Do Not Intubate/DNI    DVT Prophylaxis: Place TED hose Start: 03/05/23 1437 enoxaparin (LOVENOX) injection 40 mg Start: 03/02/23 2200   Family Communication: None at bedside   Disposition Plan: Status is: Observation The patient will require care spanning > 2 midnights and should be moved to inpatient because: Severity of illness   Planned Discharge Destination:Home   Diet: Diet Order  Diet Carb Modified Fluid consistency: Thin; Room service appropriate? Yes  Diet effective now                     Antimicrobial agents: Anti-infectives (From admission, onward)    None        MEDICATIONS: Scheduled Meds:  acetaminophen  1,000 mg Oral QHS   ascorbic acid  500 mg Oral Daily   aspirin EC  81 mg Oral Daily   atorvastatin  10 mg Oral Daily   calcium carbonate  1,250 mg Oral Daily   carbidopa-levodopa  1 tablet Oral QID   cholecalciferol  5,000 Units Oral Daily   clopidogrel  75 mg Oral q1600    diclofenac Sodium  2 g Topical BID   enoxaparin (LOVENOX) injection  40 mg Subcutaneous Q24H   fluticasone  2 spray Each Nare Daily   insulin aspart  0-15 Units Subcutaneous TID WC   irbesartan  150 mg Oral Daily   loratadine  10 mg Oral Daily   magnesium oxide  200 mg Oral QHS   multivitamin with minerals  1 tablet Oral Daily   omega-3 acid ethyl esters  1 g Oral Daily   pantoprazole  40 mg Oral Daily   rivastigmine  1.5 mg Oral BID   Continuous Infusions:  sodium chloride     PRN Meds:.acetaminophen **OR** acetaminophen, diphenhydrAMINE, ondansetron **OR** ondansetron (ZOFRAN) IV, mouth rinse, senna-docusate   I have personally reviewed following labs and imaging studies  LABORATORY DATA: CBC: Recent Labs  Lab 03/02/23 1450 03/03/23 0622  WBC 10.1 6.9  NEUTROABS 8.1*  --   HGB 11.8* 11.2*  HCT 36.5 35.4*  MCV 89.2 88.5  PLT 382 357    Basic Metabolic Panel: Recent Labs  Lab 03/02/23 1450 03/02/23 2057 03/03/23 0622  NA 138  --  135  K 4.0  --  3.7  CL 104  --  102  CO2 26  --  24  GLUCOSE 92  --  86  BUN 27*  --  24*  CREATININE 0.79  --  0.85  CALCIUM 9.8  --  9.5  MG  --  2.1  --   PHOS  --  3.1  --     GFR: Estimated Creatinine Clearance: 53.2 mL/min (by C-G formula based on SCr of 0.85 mg/dL).  Liver Function Tests: Recent Labs  Lab 03/02/23 2057  AST 12*  ALT 8  ALKPHOS 110  BILITOT 0.5  PROT 7.5  ALBUMIN 4.0   No results for input(s): "LIPASE", "AMYLASE" in the last 168 hours. No results for input(s): "AMMONIA" in the last 168 hours.  Coagulation Profile: No results for input(s): "INR", "PROTIME" in the last 168 hours.  Cardiac Enzymes: No results for input(s): "CKTOTAL", "CKMB", "CKMBINDEX", "TROPONINI" in the last 168 hours.  BNP (last 3 results) No results for input(s): "PROBNP" in the last 8760 hours.  Lipid Profile: No results for input(s): "CHOL", "HDL", "LDLCALC", "TRIG", "CHOLHDL", "LDLDIRECT" in the last 72  hours.  Thyroid Function Tests: Recent Labs    03/02/23 2057  TSH 1.062    Anemia Panel: Recent Labs    03/02/23 2057  VITAMINB12 772    Urine analysis:    Component Value Date/Time   COLORURINE YELLOW 03/02/2023 1705   APPEARANCEUR CLEAR 03/02/2023 1705   LABSPEC 1.017 03/02/2023 1705   PHURINE 5.0 03/02/2023 1705   GLUCOSEU NEGATIVE 03/02/2023 1705   HGBUR NEGATIVE 03/02/2023 1705   BILIRUBINUR NEGATIVE 03/02/2023 1705  BILIRUBINUR negative 11/17/2021 1613   KETONESUR 5 (A) 03/02/2023 1705   PROTEINUR NEGATIVE 03/02/2023 1705   UROBILINOGEN 0.2 11/17/2021 1613   UROBILINOGEN 0.2 07/11/2010 1738   NITRITE NEGATIVE 03/02/2023 1705   LEUKOCYTESUR MODERATE (A) 03/02/2023 1705    Sepsis Labs: Lactic Acid, Venous No results found for: "LATICACIDVEN"  MICROBIOLOGY: No results found for this or any previous visit (from the past 240 hour(s)).  RADIOLOGY STUDIES/RESULTS: MR BRAIN W WO CONTRAST  Result Date: 03/03/2023 CLINICAL DATA:  Seizure, new onset.  Syncope/presyncope. EXAM: MRI HEAD WITHOUT AND WITH CONTRAST TECHNIQUE: Multiplanar, multiecho pulse sequences of the brain and surrounding structures were obtained without and with intravenous contrast. CONTRAST:  7mL GADAVIST GADOBUTROL 1 MMOL/ML IV SOLN COMPARISON:  MR head without and with contrast 06/07/2020 FINDINGS: Brain: Mild atrophy and scattered white matter changes are stable bilaterally No acute infarct, hemorrhage, or mass lesion is present. The ventricles are proportionate to the degree of atrophy. No significant extraaxial fluid collection is present. The brainstem and cerebellum are within normal limits. The internal auditory canals are within normal limits. Midline structures are within normal limits. Dedicated imaging of the temporal lobes demonstrates symmetric size and signal of the hippocampal structures. Postcontrast images demonstrate no pathologic enhancement. Vascular: Flow is present in the major  intracranial arteries. Skull and upper cervical spine: The craniocervical junction is normal. Upper cervical spine is within normal limits. Marrow signal is unremarkable. Sinuses/Orbits: The paranasal sinuses and mastoid air cells are clear. Bilateral lens replacements are noted. Globes and orbits are otherwise unremarkable. IMPRESSION: 1. No acute intracranial abnormality or significant interval change. 2. Stable mild atrophy and scattered white matter disease. This likely reflects the sequela of chronic microvascular ischemia. Electronically Signed   By: Marin Roberts M.D.   On: 03/03/2023 16:56     LOS: 1 day   Huey Bienenstock, MD  [facility]    To contact the attending provider between 7A-7P or the covering provider during after hours 7P-7A, please log into the web site [email or web address] and access using universal [place] password for that web site. If you do not have the password, please call the hospital operator.  03/05/2023, 2:37 PM

## 2023-03-05 NOTE — Plan of Care (Signed)
  Problem: Education: Goal: Ability to describe self-care measures that may prevent or decrease complications (Diabetes Survival Skills Education) will improve Outcome: Progressing   Problem: Activity: Goal: Risk for activity intolerance will decrease Outcome: Progressing   Problem: Safety: Goal: Ability to remain free from injury will improve Outcome: Progressing

## 2023-03-05 NOTE — Progress Notes (Signed)
LTM EEG disconnected - no skin breakdown at unhook.  

## 2023-03-05 NOTE — Plan of Care (Signed)

## 2023-03-05 NOTE — Procedures (Addendum)
Patient Name: April Larson  MRN: [id number]  Epilepsy Attending: Charlsie Quest  Referring Physician/Provider: Kathlen Mody, MD   Duration: 03/05/2023 0510 to 03/05/2023 1200   Patient history: 24 yoF with seizure like activity getting eeg to evaluate for seizure   Level of alertness: Awake,  asleep   AEDs during EEG study: None   Technical aspects: This EEG study was done with scalp electrodes positioned according to the 10-20 International system of electrode placement. Electrical activity was reviewed with band pass filter of 1-70Hz , sensitivity of 7 uV/mm, display speed of 79mm/sec with a 60Hz  notched filter applied as appropriate. EEG data were recorded continuously and digitally stored.  Video monitoring was available and reviewed as appropriate.   Description: The posterior dominant rhythm consists of 8-9 Hz activity of moderate voltage (25-35 uV) seen predominantly in posterior head regions, symmetric and reactive to eye opening and eye closing. Sleep was characterized by vertex waves, sleep spindles (12 to 14 Hz), maximal frontocentral region. Hyperventilation and photic stimulation were not performed.      IMPRESSION: This study is within normal limits. No seizures or epileptiform discharges were seen throughout the recording.   A normal interictal EEG does not exclude the diagnosis of epilepsy.   Meliza Kage Annabelle Harman

## 2023-03-06 ENCOUNTER — Encounter: Payer: Self-pay | Admitting: Internal Medicine

## 2023-03-06 DIAGNOSIS — R55 Syncope and collapse: Secondary | ICD-10-CM | POA: Diagnosis not present

## 2023-03-06 LAB — BASIC METABOLIC PANEL
Anion gap: 9 (ref 5–15)
BUN: 26 mg/dL — ABNORMAL HIGH (ref 8–23)
CO2: 23 mmol/L (ref 22–32)
Calcium: 9.2 mg/dL (ref 8.9–10.3)
Chloride: 106 mmol/L (ref 98–111)
Creatinine, Ser: 0.75 mg/dL (ref 0.44–1.00)
GFR, Estimated: >60 mL/min (ref >60)
Glucose, Bld: 109 mg/dL — ABNORMAL HIGH (ref 70–99)
Potassium: 3.9 mmol/L (ref 3.5–5.1)
Sodium: 138 mmol/L (ref 135–145)

## 2023-03-06 LAB — CBC
HCT: 35.2 % — ABNORMAL LOW (ref 36.0–46.0)
Hemoglobin: 10.9 g/dL — ABNORMAL LOW (ref 12.0–15.0)
MCH: 27.1 pg (ref 26.0–34.0)
MCHC: 31 g/dL (ref 30.0–36.0)
MCV: 87.6 fL (ref 80.0–100.0)
Platelets: 357 10*3/uL (ref 150–400)
RBC: 4.02 MIL/uL (ref 3.87–5.11)
RDW: 14.2 % (ref 11.5–15.5)
WBC: 6.4 10*3/uL (ref 4.0–10.5)
nRBC: 0 % (ref 0.0–0.2)

## 2023-03-06 LAB — GLUCOSE, CAPILLARY: Glucose-Capillary: 126 mg/dL — ABNORMAL HIGH (ref 70–99)

## 2023-03-06 LAB — CORTISOL-AM, BLOOD: Cortisol - AM: 7.2 ug/dL (ref 6.7–22.6)

## 2023-03-06 MED ORDER — XULTOPHY 100-3.6 UNIT-MG/ML ~~LOC~~ SOPN: PEN_INJECTOR | SUBCUTANEOUS | Status: DC

## 2023-03-06 NOTE — Progress Notes (Signed)
OT Note  Patient Details Name: April Larson MRN: [id number] DOB: May 02, 1950    Pt. And pts. Sister in room actively prepping to go home in a few minutes.  Met with both briefly to provide services for the blind handout.  Reviewed examples of services that can be provided with modifications and other assistive/adaptive devices for increasing pts. Independence and safety with adls.  Pt. And pts. Sister receptive to education and handout.  Assisted pts. Sister locate the closer locations on the handout to them so she would know which number to call.    Alessandra Bevels Lorraine-COTA/L 03/06/2023, 11:30 AM

## 2023-03-06 NOTE — Discharge Summary (Signed)
Physician Discharge Summary  Tenishia Vilikia Watkins Larson EXB:[id number] DOB: [birth year] DOA: 03/02/2023  PCP: Dorothyann Peng, MD  Admit date: 03/02/2023 Discharge date: 03/06/2023  Admitted From: (Home) Disposition:  (Home )  Recommendations for Outpatient Follow-up:  Follow up with PCP in 1-2 weeks Please obtain BMP/CBC in one week  Home Health: (YES)  Diet recommendation: Carb Modified   Brief/Interim Summary:  April Larson is a 72 y.o. female with history of Parkinson's disease, diabetes, hypertension, sleep apnea on CPAP and peripheral vascular disease who presents with multiple episodes of staring off into space and being on interactive.  Episodes occur without warning and patient has no memory of what goes on during the episode, but she does feel little confused and foggy afterwards.  She states she is not aware of when the episodes happen and that her brother typically points them out for her.  Was concern for seizures so neurology were consulted , she has been connected to long-term EEG for spell capture, no seizures noted.  No concern for seizures, but her blood pressure was noted to be soft, so there was more concern of orthostasis causing this episodes, please see discussion below.  Spells, secondary to orthostasis, seizures ruled out Initial concern this was related to seizures, but MRI with no acute findings, unclear whether this is related to orthostatic mechanism associated with parkinsonism versus seizure Neurology input greatly appreciated, patient was on LTM EEG, there was no spells, and no concerns of elliptic form activity on her EEG, no indication for AED, no further workup regarding seizures per neurology. -This is related to her orthostasis, with her known history of concern, and given few soft readings of blood pressure, so her antihypertensive regimen has been discontinued, and she was provided TED hose .  I have discussed with the patient and her  sister that is okay if we tolerate some mildly elevated blood pressure readings when she is supine or sitting up to avoid orthostasis upon standing. -Cortisol within normal limits.    Atypical Parkinson's disease/supranuclear palsy Sinemet/rivastigmine Resume outpatient follow-up with neurology   DM-2 Resume home regimen at a lower dose   HTN See above discussion regarding possible orthostasis,   HLD Statin   PAD Appears to be on DAPT with aspirin/Plavix Statin   OSA CPAP   Debility/deconditioning Walks with the help of a walker PT/OT eval   BMI: Estimated body mass index is 25.61 kg/m as calculated from the following:   Height as of this encounter: 5\' 2"  (1.575 m).   Weight as of this encounter: 63.5 kg.   Discharge Diagnoses:  Principal Problem:   Seizure-like activity [facility]) Active Problems:   Atypical syncope   Seizure disorder [facility])    Discharge Instructions  Discharge Instructions     Diet - low sodium heart healthy   Complete by: As directed    Discharge instructions   Complete by: As directed    Follow with Primary MD Dorothyann Peng, MD in 7 days   Get CBC, CMP,  checked  by Primary MD next visit.    Activity: As tolerated with Full fall precautions use walker/cane & assistance as needed   Disposition Home    Diet: Carb modified  On your next visit with your primary care physician please Get Medicines reviewed and adjusted.   Please request your Prim.MD to go over all Hospital Tests and Procedure/Radiological results at the follow up, please get all Hospital records sent to your Prim MD by signing hospital  release before you go home.   If you experience worsening of your admission symptoms, develop shortness of breath, life threatening emergency, suicidal or homicidal thoughts you must seek medical attention immediately by calling 911 or calling your MD immediately  if symptoms less severe.  You Must read complete instructions/literature  along with all the possible adverse reactions/side effects for all the Medicines you take and that have been prescribed to you. Take any new Medicines after you have completely understood and accpet all the possible adverse reactions/side effects.   Do not drive, operating heavy machinery, perform activities at heights, swimming or participation in water activities or provide baby sitting services if your were admitted for syncope or siezures until you have seen by Primary MD or a Neurologist and advised to do so again.  Do not drive when taking Pain medications.    Do not take more than prescribed Pain, Sleep and Anxiety Medications  Special Instructions: If you have smoked or chewed Tobacco  in the last 2 yrs please stop smoking, stop any regular Alcohol  and or any Recreational drug use.  Wear Seat belts while driving.   Please note  You were cared for by a hospitalist during your hospital stay. If you have any questions about your discharge medications or the care you received while you were in the hospital after you are discharged, you can call the unit and asked to speak with the hospitalist on call if the hospitalist that took care of you is not available. Once you are discharged, your primary care physician will handle any further medical issues. Please note that NO REFILLS for any discharge medications will be authorized once you are discharged, as it is imperative that you return to your primary care physician (or establish a relationship with a primary care physician if you do not have one) for your aftercare needs so that they can reassess your need for medications and monitor your lab values.   Increase activity slowly   Complete by: As directed       Allergies as of 03/06/2023   No Known Allergies      Medication List     STOP taking these medications    telmisartan-hydrochlorothiazide 40-12.5 MG tablet Commonly known as: MICARDIS HCT       TAKE these medications     Accu-Chek FastClix Lancets Misc USE TO CHECK BLOOD SUGARS TWICE DAILY   TRUEplus Lancets 28G Misc USE AS DIRECTED TO CHECK BLOOD SUGARS.   ascorbic acid 500 MG tablet Commonly known as: VITAMIN C Take 1 tablet (500 mg total) by mouth daily. What changed:  when to take this additional instructions   aspirin EC 81 MG tablet Take 1 tablet (81 mg total) by mouth daily. What changed:  when to take this additional instructions   atorvastatin 10 MG tablet Commonly known as: LIPITOR TAKE 1 TABLET EVERY DAY   B-D SINGLE USE SWABS REGULAR Pads USE AS NEEDED.   B-D ULTRAFINE III SHORT PEN 31G X 8 MM Misc Generic drug: Insulin Pen Needle Use as directed   Biotin 10 MG Caps Take 1 tablet by mouth daily. What changed:  how much to take when to take this additional instructions   calcium carbonate 1500 (600 Ca) MG Tabs tablet Commonly known as: OSCAL Take 1 tablet (1,500 mg total) by mouth daily with breakfast. What changed:  when to take this additional instructions   carbidopa-levodopa 25-100 MG tablet Commonly known as: SINEMET IR Take 1 tablet by  mouth 4 (four) times daily (1 tablet before breakfast, 1 tablet at noon, 1 tablet in the evening, and 1 tablet at bedtime). What changed:  when to take this additional instructions   clopidogrel 75 MG tablet Commonly known as: PLAVIX Take 75 mg by mouth See admin instructions. Take 75 mg by mouth at 3:30 PM   diclofenac Sodium 1 % Gel Commonly known as: VOLTAREN APPLY 2 GRAMS TOPICALLY TO THE AFFECTED AREA FOUR TIMES DAILY What changed: See the new instructions.   Fish Oil 1000 MG Caps Take 1 capsule by mouth daily What changed:  how much to take how to take this when to take this additional instructions   fluticasone 50 MCG/ACT nasal spray Commonly known as: FLONASE Place 2 sprays into both nostrils daily. What changed: when to take this   loratadine 10 MG tablet Commonly known as: CLARITIN Take 1 tablet  by mouth daily What changed:  how much to take how to take this when to take this additional instructions   MAGNESIUM PO Take 1 tablet by mouth at bedtime.   metFORMIN 500 MG tablet Commonly known as: GLUCOPHAGE TAKE ONE TABLET BY MOUTH EVERYDAY AT BEDTIME What changed:  how much to take how to take this when to take this additional instructions   omeprazole 40 MG capsule Commonly known as: PRILOSEC Take 1 capsule (40 mg total) by mouth daily before breakfast.   One-A-Day Womens 50+ Advantage Tabs Take 1 tablet by mouth daily What changed:  how much to take how to take this when to take this additional instructions   rivastigmine 1.5 MG capsule Commonly known as: EXELON Take 1 capsule (1.5 mg total) by mouth 2 (two) times daily. What changed:  when to take this additional instructions   True Metrix Air Glucose Meter Devi USE AS DIRECTED TO CHECK BLOOD SUGARS.   True Metrix Meter w/Device Kit Use as directed to check blood sugar once daily   True Metrix Blood Glucose Test test strip Generic drug: glucose blood Use as instructed   Turmeric 500 MG Caps Take 1 capsule by mouth 2 (two) times daily. What changed:  how much to take when to take this additional instructions   Tylenol 8 Hour Arthritis Pain 650 MG CR tablet Generic drug: acetaminophen Take 1,300 mg by mouth at bedtime.   Vitamin D3 125 MCG (5000 UT) Caps Take 5,000 Units by mouth See admin instructions. Take 5,000 units by mouth at 10:30 AM   Xultophy 100-3.6 UNIT-MG/ML Sopn Generic drug: Insulin Degludec-Liraglutide INJECT 8UNITS SUBCUTANEOUSLY DAILY. What changed: additional instructions               Durable Medical Equipment  (From admission, onward)           Start     Ordered   03/04/23 1323  For home use only DME Bedside commode  Once       Question:  Patient needs a bedside commode to treat with the following condition  Answer:  Seizure-like activity (HCC)   03/04/23  1322            Follow-up Information     [facility], [facility] Follow up.   Specialty: Home Health Services Why: Frances Furbish will contact you within 48 hours of dc to arrange a home visit toi begin physical and occupational therapy Contact information: [address] [place] [address] [phone]         [facility], [facility] Follow up.   Why: [employer]  is providing the bedside commode Contact information: [address] [place] [address] [phone]                No Known Allergies  Consultations: None   Procedures/Studies: MR BRAIN W WO CONTRAST  Result Date: 03/03/2023 CLINICAL DATA:  Seizure, new onset.  Syncope/presyncope. EXAM: MRI HEAD WITHOUT AND WITH CONTRAST TECHNIQUE: Multiplanar, multiecho pulse sequences of the brain and surrounding structures were obtained without and with intravenous contrast. CONTRAST:  7mL GADAVIST GADOBUTROL 1 MMOL/ML IV SOLN COMPARISON:  MR head without and with contrast 06/07/2020 FINDINGS: Brain: Mild atrophy and scattered white matter changes are stable bilaterally No acute infarct, hemorrhage, or mass lesion is present. The ventricles are proportionate to the degree of atrophy. No significant extraaxial fluid collection is present. The brainstem and cerebellum are within normal limits. The internal auditory canals are within normal limits. Midline structures are within normal limits. Dedicated imaging of the temporal lobes demonstrates symmetric size and signal of the hippocampal structures. Postcontrast images demonstrate no pathologic enhancement. Vascular: Flow is present in the major intracranial arteries. Skull and upper cervical spine: The craniocervical junction is normal. Upper cervical spine is within normal limits. Marrow signal is unremarkable. Sinuses/Orbits: The paranasal sinuses and mastoid air cells are clear. Bilateral lens replacements are noted. Globes and orbits are otherwise unremarkable.  IMPRESSION: 1. No acute intracranial abnormality or significant interval change. 2. Stable mild atrophy and scattered white matter disease. This likely reflects the sequela of chronic microvascular ischemia. Electronically Signed   By: Marin Roberts M.D.   On: 03/03/2023 16:56   Overnight EEG with video  Result Date: 03/03/2023 Charlsie Quest, MD     03/05/2023  9:50 AM Patient Name: April Larson MRN: [id number] Epilepsy Attending: Charlsie Quest Referring Physician/Provider: Kathlen Mody, MD  Duration: 03/04/2023 0510 to 03/05/2023 0510 Patient history: 89 yoF with seizure like activity getting eeg to evaluate for seizure Level of alertness: Awake,  asleep AEDs during EEG study: None Technical aspects: This EEG study was done with scalp electrodes positioned according to the 10-20 International system of electrode placement. Electrical activity was reviewed with band pass filter of 1-70Hz , sensitivity of 7 uV/mm, display speed of 33mm/sec with a 60Hz  notched filter applied as appropriate. EEG data were recorded continuously and digitally stored.  Video monitoring was available and reviewed as appropriate. Description: The posterior dominant rhythm consists of 8-9 Hz activity of moderate voltage (25-35 uV) seen predominantly in posterior head regions, symmetric and reactive to eye opening and eye closing. Sleep was characterized by vertex waves, sleep spindles (12 to 14 Hz), maximal frontocentral region. Hyperventilation and photic stimulation were not performed.   IMPRESSION: This study is within normal limits. No seizures or epileptiform discharges were seen throughout the recording. A normal interictal EEG does not exclude the diagnosis of epilepsy. Charlsie Quest   CT Head Wo Contrast  Result Date: 03/02/2023 CLINICAL DATA:  Syncope/presyncope, cerebrovascular cause suspected. EXAM: CT HEAD WITHOUT CONTRAST TECHNIQUE: Contiguous axial images were obtained from the base  of the skull through the vertex without intravenous contrast. RADIATION DOSE REDUCTION: This exam was performed according to the departmental dose-optimization program which includes automated exposure control, adjustment of the mA and/or kV according to patient size and/or use of iterative reconstruction technique. COMPARISON:  Head CT 03/27/2020 and MRI 06/07/2020 FINDINGS: Brain: There is no evidence of an acute infarct, intracranial hemorrhage, mass, midline shift, or extra-axial fluid collection. Mild cerebral atrophy is within  normal limits for age. Cerebral white matter hypodensities are nonspecific but compatible with minimal chronic small vessel ischemic disease. Vascular: Calcified atherosclerosis at the skull base. No hyperdense vessel. Skull: No acute fracture or suspicious osseous lesion. Sinuses/Orbits: Visualized paranasal sinuses and mastoid air cells are clear. Bilateral cataract extraction. Other: None. IMPRESSION: No evidence of acute intracranial abnormality. Electronically Signed   By: Sebastian Ache M.D.   On: 03/02/2023 16:17      Subjective: No significant events overnight, she denies any complaints today, no dizziness, no lightheadedness, eager to go home  Discharge Exam: Vitals:   03/06/23 0357 03/06/23 0745  BP: 123/64 133/61  Pulse: 70 87  Resp: 16 17  Temp: (!) 97.5 F (36.4 C) 97.9 F (36.6 C)  SpO2: 99% 98%   Vitals:   03/05/23 2000 03/05/23 2341 03/06/23 0357 03/06/23 0745  BP: 99/61 126/65 123/64 133/61  Pulse: 90 73 70 87  Resp: 18 18 16 17   Temp: 97.8 F (36.6 C) 97.6 F (36.4 C) (!) 97.5 F (36.4 C) 97.9 F (36.6 C)  TempSrc: Oral Oral Oral Oral  SpO2: 97% 98% 99% 98%  Weight:      Height:        General: Pt is alert, awake, not in acute distress Cardiovascular: RRR, S1/S2 +, no rubs, no gallops Respiratory: CTA bilaterally, no wheezing, no rhonchi Abdominal: Soft, NT, ND, bowel sounds + Extremities: no edema, no cyanosis    The results of  significant diagnostics from this hospitalization (including imaging, microbiology, ancillary and laboratory) are listed below for reference.     Microbiology: No results found for this or any previous visit (from the past 240 hour(s)).   Labs: BNP (last 3 results) No results for input(s): "BNP" in the last 8760 hours. Basic Metabolic Panel: Recent Labs  Lab 03/02/23 1450 03/02/23 2057 03/03/23 0622 03/06/23 0600  NA 138  --  135 138  K 4.0  --  3.7 3.9  CL 104  --  102 106  CO2 26  --  24 23  GLUCOSE 92  --  86 109*  BUN 27*  --  24* 26*  CREATININE 0.79  --  0.85 0.75  CALCIUM 9.8  --  9.5 9.2  MG  --  2.1  --   --   PHOS  --  3.1  --   --    Liver Function Tests: Recent Labs  Lab 03/02/23 2057  AST 12*  ALT 8  ALKPHOS 110  BILITOT 0.5  PROT 7.5  ALBUMIN 4.0   No results for input(s): "LIPASE", "AMYLASE" in the last 168 hours. No results for input(s): "AMMONIA" in the last 168 hours. CBC: Recent Labs  Lab 03/02/23 1450 03/03/23 0622 03/06/23 0600  WBC 10.1 6.9 6.4  NEUTROABS 8.1*  --   --   HGB 11.8* 11.2* 10.9*  HCT 36.5 35.4* 35.2*  MCV 89.2 88.5 87.6  PLT 382 357 357   Cardiac Enzymes: No results for input(s): "CKTOTAL", "CKMB", "CKMBINDEX", "TROPONINI" in the last 168 hours. BNP: Invalid input(s): "POCBNP" CBG: Recent Labs  Lab 03/05/23 1218 03/05/23 1315 03/05/23 1705 03/05/23 2102 03/06/23 0743  GLUCAP 53* 113* 150* 133* 126*   D-Dimer No results for input(s): "DDIMER" in the last 72 hours. Hgb A1c No results for input(s): "HGBA1C" in the last 72 hours. Lipid Profile No results for input(s): "CHOL", "HDL", "LDLCALC", "TRIG", "CHOLHDL", "LDLDIRECT" in the last 72 hours. Thyroid function studies No results for input(s): "TSH", "T4TOTAL", "T3FREE", "  THYROIDAB" in the last 72 hours.  Invalid input(s): "FREET3" Anemia work up No results for input(s): "VITAMINB12", "FOLATE", "FERRITIN", "TIBC", "IRON", "RETICCTPCT" in the last 72  hours. Urinalysis    Component Value Date/Time   COLORURINE YELLOW 03/02/2023 1705   APPEARANCEUR CLEAR 03/02/2023 1705   LABSPEC 1.017 03/02/2023 1705   PHURINE 5.0 03/02/2023 1705   GLUCOSEU NEGATIVE 03/02/2023 1705   HGBUR NEGATIVE 03/02/2023 1705   BILIRUBINUR NEGATIVE 03/02/2023 1705   BILIRUBINUR negative 11/17/2021 1613   KETONESUR 5 (A) 03/02/2023 1705   PROTEINUR NEGATIVE 03/02/2023 1705   UROBILINOGEN 0.2 11/17/2021 1613   UROBILINOGEN 0.2 07/11/2010 1738   NITRITE NEGATIVE 03/02/2023 1705   LEUKOCYTESUR MODERATE (A) 03/02/2023 1705   Sepsis Labs Recent Labs  Lab 03/02/23 1450 03/03/23 0622 03/06/23 0600  WBC 10.1 6.9 6.4   Microbiology No results found for this or any previous visit (from the past 240 hour(s)).   Time coordinating discharge: Over 30 minutes  SIGNED:   Huey Bienenstock, MD  [facility] 03/06/2023, 11:15 AM Pager   If 7PM-7AM, please contact night-coverage [email or web address] Password [email or web address]

## 2023-03-06 NOTE — TOC Transition Note (Signed)
Transition of Care [facility]) - CM/SW Discharge Note   Patient Details  Name: April Larson MRN: [id number] Date of Birth: 12-08-1950  Transition of Care [facility]) CM/SW Contact:  Lawerance Sabal, RN Phone Number: 03/06/2023, 9:52 AM   Clinical Narrative:     Rondel Jumbo that patient will DC today.  DME BSC ordered 11/29 to be delivered to the room    Final next level of care: Home w Home Health Services Barriers to Discharge: No Barriers Identified   Patient Goals and CMS Choice      Discharge Placement                         Discharge Plan and Services Additional resources added to the After Visit Summary for                              [facility] Agency: [facility] Date [facility] Agency Contacted: 03/06/23 Time HH Agency Contacted: [phone] Representative spoke with at [facility] Agency: Kandee Keen  Social Determinants of Health (SDOH) Interventions SDOH Screenings   Food Insecurity: No Food Insecurity (03/02/2023)  Housing: Low Risk  (03/02/2023)  Transportation Needs: No Transportation Needs (03/02/2023)  Utilities: Not At Risk (03/02/2023)  Depression (PHQ2-9): Low Risk  (08/04/2022)  Financial Resource Strain: Low Risk  (08/04/2022)  Physical Activity: Inactive (08/04/2022)  Stress: No Stress Concern Present (08/04/2022)  Tobacco Use: Medium Risk (03/02/2023)     Readmission Risk Interventions     No data to display

## 2023-03-06 NOTE — Progress Notes (Signed)
Mobility Specialist Progress Note;   03/06/23 0855  Mobility  Activity Transferred to/from [facility];Ambulated with assistance in room  Level of Assistance Contact guard assist, steadying assist  Assistive Device Front wheel walker  Distance Ambulated (ft) 15 ft  Activity Response Tolerated well  Mobility Referral Yes  $Mobility charge 1 Mobility  Mobility Specialist Start Time (ACUTE ONLY) 0855  Mobility Specialist Stop Time (ACUTE ONLY) 0905  Mobility Specialist Time Calculation (min) (ACUTE ONLY) 10 min   Pt agreeable to mobility. On BSC upon arrival, void successful. Required MinG assistance for STS and during ambulation. Min verbal cues required for safety around room. VSS throughout and no c/o. Pt left in chair with all needs met. RN in room.   Caesar Bookman Mobility Specialist Please contact via SecureChat or Rehab Office [phone]

## 2023-03-06 NOTE — Plan of Care (Signed)
  Problem: Education: Goal: Ability to describe self-care measures that may prevent or decrease complications (Diabetes Survival Skills Education) will improve Outcome: Progressing   Problem: Skin Integrity: Goal: Risk for impaired skin integrity will decrease Outcome: Progressing   Problem: Activity: Goal: Risk for activity intolerance will decrease Outcome: Progressing

## 2023-03-06 NOTE — Discharge Instructions (Addendum)
Follow with Primary MD Dorothyann Peng, MD in 7 days   Get CBC, CMP,  checked  by Primary MD next visit.    Activity: As tolerated with Full fall precautions use walker/cane & assistance as needed   Disposition Home    Diet: Heart Healthy   On your next visit with your primary care physician please Get Medicines reviewed and adjusted.   Please request your Prim.MD to go over all Hospital Tests and Procedure/Radiological results at the follow up, please get all Hospital records sent to your Prim MD by signing hospital release before you go home.   If you experience worsening of your admission symptoms, develop shortness of breath, life threatening emergency, suicidal or homicidal thoughts you must seek medical attention immediately by calling 911 or calling your MD immediately  if symptoms less severe.  You Must read complete instructions/literature along with all the possible adverse reactions/side effects for all the Medicines you take and that have been prescribed to you. Take any new Medicines after you have completely understood and accpet all the possible adverse reactions/side effects.   Do not drive, operating heavy machinery, perform activities at heights, swimming or participation in water activities or provide baby sitting services if your were admitted for syncope or siezures until you have seen by Primary MD or a Neurologist and advised to do so again.  Do not drive when taking Pain medications.    Do not take more than prescribed Pain, Sleep and Anxiety Medications  Special Instructions: If you have smoked or chewed Tobacco  in the last 2 yrs please stop smoking, stop any regular Alcohol  and or any Recreational drug use.  Wear Seat belts while driving.   Please note  You were cared for by a hospitalist during your hospital stay. If you have any questions about your discharge medications or the care you received while you were in the hospital after you are discharged,  you can call the unit and asked to speak with the hospitalist on call if the hospitalist that took care of you is not available. Once you are discharged, your primary care physician will handle any further medical issues. Please note that NO REFILLS for any discharge medications will be authorized once you are discharged, as it is imperative that you return to your primary care physician (or establish a relationship with a primary care physician if you do not have one) for your aftercare needs so that they can reassess your need for medications and monitor your lab values.

## 2023-03-07 ENCOUNTER — Other Ambulatory Visit (INDEPENDENT_AMBULATORY_CARE_PROVIDER_SITE_OTHER): Payer: Self-pay | Admitting: Internal Medicine

## 2023-03-07 ENCOUNTER — Telehealth: Payer: Self-pay

## 2023-03-07 DIAGNOSIS — G231 Progressive supranuclear ophthalmoplegia [Steele-Richardson-Olszewski]: Secondary | ICD-10-CM

## 2023-03-07 DIAGNOSIS — Z794 Long term (current) use of insulin: Secondary | ICD-10-CM

## 2023-03-07 DIAGNOSIS — E1122 Type 2 diabetes mellitus with diabetic chronic kidney disease: Secondary | ICD-10-CM | POA: Diagnosis not present

## 2023-03-07 DIAGNOSIS — I129 Hypertensive chronic kidney disease with stage 1 through stage 4 chronic kidney disease, or unspecified chronic kidney disease: Secondary | ICD-10-CM

## 2023-03-07 DIAGNOSIS — Z7982 Long term (current) use of aspirin: Secondary | ICD-10-CM | POA: Diagnosis not present

## 2023-03-07 DIAGNOSIS — R269 Unspecified abnormalities of gait and mobility: Secondary | ICD-10-CM | POA: Diagnosis not present

## 2023-03-07 DIAGNOSIS — D75839 Thrombocytosis, unspecified: Secondary | ICD-10-CM | POA: Diagnosis not present

## 2023-03-07 DIAGNOSIS — N182 Chronic kidney disease, stage 2 (mild): Secondary | ICD-10-CM | POA: Diagnosis not present

## 2023-03-07 DIAGNOSIS — G20A1 Parkinson's disease without dyskinesia, without mention of fluctuations: Secondary | ICD-10-CM

## 2023-03-07 DIAGNOSIS — Z9181 History of falling: Secondary | ICD-10-CM | POA: Diagnosis not present

## 2023-03-07 NOTE — Transitions of Care (Post Inpatient/ED Visit) (Signed)
   03/07/2023  Name: Mireille Vilikia Watkins [place] MRN: [id number] DOB: 1950/12/15  Today's TOC FU Call Status: Today's TOC FU Call Status:: Successful TOC FU Call Completed TOC FU Call Complete Date: 03/06/23 Patient's Name and Date of Birth confirmed.  Transition Care Management Follow-up Telephone Call Date of Discharge: 03/07/23 Discharge Facility: [facility]) Type of Discharge: Inpatient Admission Primary Inpatient Discharge Diagnosis:: Syncope How have you been since you were released from the hospital?: Better  Items Reviewed: Did you receive and understand the discharge instructions provided?: Yes Medications obtained,verified, and reconciled?: Yes (Medications Reviewed) Any new allergies since your discharge?: No Dietary orders reviewed?: No Do you have support at home?: Yes  Medications Reviewed Today: Medications Reviewed Today   Medications were not reviewed in this encounter     Home Care and Equipment/Supplies: Were Home Health Services Ordered?: NA Any new equipment or medical supplies ordered?: NA  Functional Questionnaire: Do you need assistance with bathing/showering or dressing?: No Do you need assistance with meal preparation?: No Do you need assistance with eating?: No Do you have difficulty maintaining continence: No Do you need assistance with getting out of bed/getting out of a chair/moving?: No Do you have difficulty managing or taking your medications?: No  Follow up appointments reviewed: PCP Follow-up appointment confirmed?: Yes Date of PCP follow-up appointment?: 03/15/23 Follow-up Provider: Dorothyann Peng MD Specialist Hospital Follow-up appointment confirmed?: No Reason Specialist Follow-Up Not Confirmed: [facility] Calling Clinician Notified Provider Practice of Needed Appointment Do you need transportation to your follow-up appointment?: No Do you understand care options if your condition(s) worsen?: Yes-patient verbalized  understanding    SIGNATUREYl,RMA

## 2023-03-07 NOTE — Progress Notes (Signed)
Cc: home health recertification  Received home health orders orders from [facility]. Start of care 12/16/22.   Certification and orders from 02/14/23 through 04/14/2023 are reviewed, signed and faxed back to home health company.  Need of intermittent skilled services at home: PT/OT  The home health care plan has been established by me and will be reviewed and updated as needed to maximize patient recovery.  I certify that all home health services have been and will be furnished to the patient while under my care.  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

## 2023-03-07 NOTE — Transitions of Care (Post Inpatient/ED Visit) (Signed)
   03/07/2023  Name: April Larson MRN: [id number] DOB: [birth year]  Today's TOC FU Call Status: Today's TOC FU Call Status:: Unsuccessful Call (1st Attempt) Unsuccessful Call (1st Attempt) Date: 03/07/23   Unsuccessful outreach attempt to patient to offer 30-day TOC program.    Follow Up Plan: Additional outreach attempts will be made to reach the patient to complete the Transitions of Care (Post Inpatient/ED visit) call.     Antionette Fairy, RN,BSN,CCM RN Care Manager Transitions of Care  [facility]  Direct Phone: [phone] Toll Free: [phone] Fax: [phone]

## 2023-03-08 ENCOUNTER — Telehealth: Payer: Self-pay

## 2023-03-08 NOTE — Transitions of Care (Post Inpatient/ED Visit) (Signed)
03/08/2023  Name: April Larson MRN: [id number] DOB: February 28, 1951  Today's TOC FU Call Status: Today's TOC FU Call Status:: Successful TOC FU Call Completed TOC FU Call Complete Date: 03/08/23 Patient's Name and Date of Birth confirmed.  Transition Care Management Follow-up Telephone Call Date of Discharge: 03/06/23 Discharge Facility: [facility]) Type of Discharge: Inpatient Admission Primary Inpatient Discharge Diagnosis:: "atypical syncope" How have you been since you were released from the hospital?: Better (Pt voices she is doinng good-just waking up-eating & sleeping well, no issues with elimination, cbg yest 91) Any questions or concerns?: No  Items Reviewed: Did you receive and understand the discharge instructions provided?: Yes Medications obtained,verified, and reconciled?: Yes (Medications Reviewed) (pt was unaware of insulin dosage changed and had been giving herself 10 units, she will start doing 8 units as ordered) Any new allergies since your discharge?: No Dietary orders reviewed?: Yes Do you have support at home?: Yes People in Home: sibling(s) Name of Support/Comfort Primary Source: lives with brother-  sister lives nearby and assists her as well  Medications Reviewed Today: Medications Reviewed Today     Reviewed by Charlyn Minerva, RN (Registered Nurse) on 03/08/23 at 0830  Med List Status: <None>   Medication Order Taking? Sig Documenting Provider Last Dose Status Informant  ACCU-CHEK FASTCLIX LANCETS MISC [id number]  USE TO CHECK BLOOD SUGARS TWICE DAILY Dorothyann Peng, MD  Active   Alcohol Swabs (B-D SINGLE USE SWABS REGULAR) PADS [id number]  USE AS NEEDED. Dorothyann Peng, MD  Active   aspirin EC 81 MG tablet [id number] Yes Take 1 tablet (81 mg total) by mouth daily.  Patient taking differently: Take 81 mg by mouth See admin instructions. Take 81 mg by mouth between 8 AM-10 AM   Dorothyann Peng, MD Taking Active Self   atorvastatin (LIPITOR) 10 MG tablet [id number] Yes TAKE 1 TABLET EVERY DAY Dorothyann Peng, MD Taking Active Self  Biotin 10 MG CAPS [id number] Yes Take 1 tablet by mouth daily.  Patient taking differently: Take 10 mg by mouth See admin instructions. Take 10 mg by mouth at 10:30 AM   Dorothyann Peng, MD Taking Active Self  Blood Glucose Monitoring Suppl (TRUE METRIX AIR GLUCOSE METER) DEVI [id number]  USE AS DIRECTED TO CHECK BLOOD SUGARS. Dorothyann Peng, MD  Active   Blood Glucose Monitoring Suppl (TRUE METRIX METER) w/Device KIT [id number]  Use as directed to check blood sugar once daily Dorothyann Peng, MD  Active   calcium carbonate (OSCAL) 1500 (600 Ca) MG TABS tablet [id number] Yes Take 1 tablet (1,500 mg total) by mouth daily with breakfast.  Patient taking differently: Take 1,500 mg by mouth See admin instructions. Take 1,500 mg by mouth at 10:30 AM   Dorothyann Peng, MD Taking Active Self  carbidopa-levodopa (SINEMET IR) 25-100 MG tablet [id number] Yes Take 1 tablet by mouth 4 (four) times daily (1 tablet before breakfast, 1 tablet at noon, 1 tablet in the evening, and 1 tablet at bedtime).  Patient taking differently: Take 1 tablet by mouth See admin instructions. Take 1 tablet by mouth between 7:30-8 AM, 10:30 AM, 3 PM-4 PM, 10:00 PM   Dorothyann Peng, MD Taking Active Self  Cholecalciferol (VITAMIN D3) 125 MCG (5000 UT) CAPS [id number] Yes Take 5,000 Units by mouth See admin instructions. Take 5,000 units by mouth at 10:30 AM [provider] Taking Active Self  clopidogrel (PLAVIX) 75 MG tablet [id number] Yes Take 75 mg by mouth See admin instructions. Take 75 mg by mouth  at 3:30 PM [provider] Taking Active Self  diclofenac Sodium (VOLTAREN) 1 % GEL [id number] Yes APPLY 2 GRAMS TOPICALLY TO THE AFFECTED AREA FOUR TIMES DAILY  Patient taking differently: Apply 2 g topically See admin instructions. Apply 2 grams to the neck, under the breasts, and to the back 2 times a day    Dorothyann Peng, MD Taking Active Self  fluticasone (FLONASE) 50 MCG/ACT nasal spray [id number] Yes Place 2 sprays into both nostrils daily.  Patient taking differently: Place 2 sprays into both nostrils in the morning.   Arnette Felts, FNP Taking Active Self  glucose blood (TRUE METRIX BLOOD GLUCOSE TEST) test strip [id number] Yes Use as instructed Dorothyann Peng, MD Taking Active   Insulin Degludec-Liraglutide (XULTOPHY) 100-3.6 UNIT-MG/ML SOPN [id number]  INJECT 8UNITS SUBCUTANEOUSLY DAILY. Elgergawy, Leana Roe, MD  Active            Med Note Antionette Fairy J   Tue Mar 08, 2023  8:29 AM) Pt states she has been taking 10 units daily since discharge-was not aware of dosage change to 8 units  Insulin Pen Needle (B-D ULTRAFINE III SHORT PEN) 31G X 8 MM MISC [id number]  Use as directed Dorothyann Peng, MD  Active   loratadine (CLARITIN) 10 MG tablet [id number] Yes Take 1 tablet by mouth daily  Patient taking differently: Take 10 mg by mouth in the morning. Take 10 mg by mouth at 10:30 AM   Dorothyann Peng, MD Taking Active Self  MAGNESIUM PO [id number] Yes Take 1 tablet by mouth at bedtime. [provider] Taking Active Self           Med Note Antony Madura, Melody Comas Mar 02, 2023  8:40 PM) Strength not noted  metFORMIN (GLUCOPHAGE) 500 MG tablet [id number] Yes TAKE ONE TABLET BY MOUTH EVERYDAY AT BEDTIME  Patient taking differently: Take 500 mg by mouth See admin instructions. Take 500 mg by mouth at 3 PM   Dorothyann Peng, MD Taking Active Self  Multiple Vitamins-Minerals (ONE-A-DAY WOMENS 50+ ADVANTAGE) TABS [id number] Yes Take 1 tablet by mouth daily  Patient taking differently: Take 1 tablet by mouth See admin instructions. Take 1 tablet by mouth at 10:30 AM   Dorothyann Peng, MD Taking Active Self  Omega-3 Fatty Acids (FISH OIL) 1000 MG CAPS [id number] Yes Take 1 capsule by mouth daily  Patient taking differently: Take 1,000 mg by mouth See admin instructions. Take 1,000 mg by mouth at 3 PM    Dorothyann Peng, MD Taking Active Self  omeprazole (PRILOSEC) 40 MG capsule [id number] Yes Take 1 capsule (40 mg total) by mouth daily before breakfast.  Taking Active Self  rivastigmine (EXELON) 1.5 MG capsule [id number] Yes Take 1 capsule (1.5 mg total) by mouth 2 (two) times daily.  Patient taking differently: Take 1.5 mg by mouth See admin instructions. Take 1.5 mg by mouth in the morning and at 3 PM   Huston Foley, MD Taking Active Self  TRUEplus Lancets 28G MISC [id number]  USE AS DIRECTED TO CHECK BLOOD SUGARS. Dorothyann Peng, MD  Active   Turmeric 500 MG CAPS [id number] Yes Take 1 capsule by mouth 2 (two) times daily.  Patient taking differently: Take 500 mg by mouth See admin instructions. Take 500 mg by mouth at 7 AM and 4:30 PM   Dorothyann Peng, MD Taking Active Self  TYLENOL 8 HOUR ARTHRITIS PAIN 650 MG CR tablet [id number] Yes Take 1,300 mg by mouth at bedtime. [provider] Taking Active Self  vitamin C (ASCORBIC ACID) 500 MG tablet [id number] Yes Take 1 tablet (500 mg total) by mouth daily.  Patient taking differently: Take 500 mg by mouth See admin instructions. Take 500 mg by mouth at 10:30 AM   Dorothyann Peng, MD Taking Active Self  Med List Note Allyne Gee, Melina Schools, MD 10/12/18 1448): Change Xultophy to 22 units 10/12/2018            Home Care and Equipment/Supplies: Were Home Health Services Ordered?: Yes Name of Home Health Agency:: [facility] Has Agency set up a time to come to your home?: No (Pt confirms she had missed call and VM msg from agency but has not called them back-she call them today to arrange HV) Any new equipment or medical supplies ordered?: Yes Name of Medical supply agency?: [facility] Were you able to get the equipment/medical supplies?: Yes Do you have any questions related to the use of the equipment/supplies?: No  Functional Questionnaire: Do you need assistance with bathing/showering or dressing?: No Do you need assistance with meal preparation?:  No Do you need assistance with eating?: No Do you have difficulty maintaining continence: No Do you need assistance with getting out of bed/getting out of a chair/moving?: No Do you have difficulty managing or taking your medications?: No  Follow up appointments reviewed: PCP Follow-up appointment confirmed?: Yes Date of PCP follow-up appointment?: 03/15/23 Follow-up Provider: Dr. Allyne Gee Specialist [facility] Follow-up appointment confirmed?: NA Do you need transportation to your follow-up appointment?: No (pt confirms her sister takes her to appts) Do you understand care options if your condition(s) worsen?: Yes-patient verbalized understanding  SDOH Interventions Today    Flowsheet Row Most Recent Value  SDOH Interventions   Food Insecurity Interventions Intervention Not Indicated  Housing Interventions Intervention Not Indicated  Transportation Interventions Intervention Not Indicated  Utilities Interventions Intervention Not Indicated       TOC interventions discussed/reviewed: -Discussed/reviewed insurance/health plans benefits -Doctor visit discussed/reviewed -PCP -Doctor visits discussed/reviewed-Specialist -Provided Verbal Education: 30-day TOC program, nutrition, meds & their functions, symptom mgmt., fall/safety measures in the home -Disease mgmt. discussed/reviewed: DM, HTN mgmt- pt confirms she has device in the home, has not checked BP reading today-states it was not low yest-instructed on importance of checking BP daily, recording value and taking to MD appt for MD to review since pt's BP was stopped in the hospital due to low BPs   Antionette Fairy, RN,BSN,CCM RN Care Manager Transitions of Care  [facility]  Direct Phone: [phone] Toll Free: [phone] Fax: [phone]

## 2023-03-09 ENCOUNTER — Telehealth: Payer: Self-pay

## 2023-03-11 DIAGNOSIS — Z7902 Long term (current) use of antithrombotics/antiplatelets: Secondary | ICD-10-CM | POA: Diagnosis not present

## 2023-03-11 DIAGNOSIS — D75839 Thrombocytosis, unspecified: Secondary | ICD-10-CM | POA: Diagnosis not present

## 2023-03-11 DIAGNOSIS — E1122 Type 2 diabetes mellitus with diabetic chronic kidney disease: Secondary | ICD-10-CM | POA: Diagnosis not present

## 2023-03-11 DIAGNOSIS — Z794 Long term (current) use of insulin: Secondary | ICD-10-CM | POA: Diagnosis not present

## 2023-03-11 DIAGNOSIS — E7800 Pure hypercholesterolemia, unspecified: Secondary | ICD-10-CM | POA: Diagnosis not present

## 2023-03-11 DIAGNOSIS — Z7984 Long term (current) use of oral hypoglycemic drugs: Secondary | ICD-10-CM | POA: Diagnosis not present

## 2023-03-11 DIAGNOSIS — I129 Hypertensive chronic kidney disease with stage 1 through stage 4 chronic kidney disease, or unspecified chronic kidney disease: Secondary | ICD-10-CM | POA: Diagnosis not present

## 2023-03-11 DIAGNOSIS — G20C Parkinsonism, unspecified: Secondary | ICD-10-CM | POA: Diagnosis not present

## 2023-03-11 DIAGNOSIS — N182 Chronic kidney disease, stage 2 (mild): Secondary | ICD-10-CM | POA: Diagnosis not present

## 2023-03-14 ENCOUNTER — Telehealth: Payer: Self-pay | Admitting: Neurology

## 2023-03-14 ENCOUNTER — Encounter: Payer: Self-pay | Admitting: Internal Medicine

## 2023-03-14 ENCOUNTER — Ambulatory Visit: Payer: Medicare PPO | Admitting: Internal Medicine

## 2023-03-14 VITALS — BP 132/60 | HR 102 | Temp 98.1°F | Ht 62.0 in | Wt 149.6 lb

## 2023-03-14 DIAGNOSIS — G40909 Epilepsy, unspecified, not intractable, without status epilepticus: Secondary | ICD-10-CM

## 2023-03-14 DIAGNOSIS — E7800 Pure hypercholesterolemia, unspecified: Secondary | ICD-10-CM | POA: Diagnosis not present

## 2023-03-14 DIAGNOSIS — N182 Chronic kidney disease, stage 2 (mild): Secondary | ICD-10-CM | POA: Diagnosis not present

## 2023-03-14 DIAGNOSIS — G231 Progressive supranuclear ophthalmoplegia [Steele-Richardson-Olszewski]: Secondary | ICD-10-CM | POA: Diagnosis not present

## 2023-03-14 DIAGNOSIS — D649 Anemia, unspecified: Secondary | ICD-10-CM | POA: Diagnosis not present

## 2023-03-14 DIAGNOSIS — I129 Hypertensive chronic kidney disease with stage 1 through stage 4 chronic kidney disease, or unspecified chronic kidney disease: Secondary | ICD-10-CM

## 2023-03-14 DIAGNOSIS — D75839 Thrombocytosis, unspecified: Secondary | ICD-10-CM | POA: Diagnosis not present

## 2023-03-14 DIAGNOSIS — Z794 Long term (current) use of insulin: Secondary | ICD-10-CM | POA: Diagnosis not present

## 2023-03-14 DIAGNOSIS — G20C Parkinsonism, unspecified: Secondary | ICD-10-CM

## 2023-03-14 DIAGNOSIS — Z79899 Other long term (current) drug therapy: Secondary | ICD-10-CM

## 2023-03-14 DIAGNOSIS — R55 Syncope and collapse: Secondary | ICD-10-CM

## 2023-03-14 DIAGNOSIS — E1122 Type 2 diabetes mellitus with diabetic chronic kidney disease: Secondary | ICD-10-CM | POA: Diagnosis not present

## 2023-03-14 DIAGNOSIS — Z7902 Long term (current) use of antithrombotics/antiplatelets: Secondary | ICD-10-CM | POA: Diagnosis not present

## 2023-03-14 DIAGNOSIS — Z7984 Long term (current) use of oral hypoglycemic drugs: Secondary | ICD-10-CM | POA: Diagnosis not present

## 2023-03-14 NOTE — Progress Notes (Signed)
I,[name], CMA,acting as a Neurosurgeon for [name], MD.,have documented all relevant documentation on the behalf of [name], MD,as directed by  [name], MD while in the presence of [name], MD.  Subjective:  Patient ID: April Larson , female    DOB: Dec 17, 1950 , 72 y.o.   MRN: [id number]  Chief Complaint  Patient presents with   Hospital f/u    HPI  Patient presents today for ER f/u. She was admitted on 03/02/23 and discharged on 03/06/23. Patient presents today with her sister. She presented in ER w/ c/o multiple episodes of staring off into space and not being interactive. Episodes occur without warning and patient has no memory of what goes on during the episode, but she does feel little confused and foggy afterwards.  She states she is not aware of when the episodes happen and that her brother typically points them out for her.  She was admitted for further evaluation. Initially, it was felt she was having seizures, so Neurology was consulted. Long-term EEG was placed for spell capture, no seizures noted.  It is felt that orthostasis is causing this episodes, so her blood pressure meds were discontinued. She denies having any further episodes.   However, she does admit to having low blood sugars. Usually less than 80. She reports compliance with Xultophy. Admits her eating has been more erratic.    Patient nor her sister have any questions or concerns at this time. They would like a referral to [facility] [phone].     Past Medical History:  Diagnosis Date   Diabetes mellitus without complication (HCC)    Hypertension    Parkinson disease (HCC)    Substance abuse (HCC)      Family History  Problem Relation Age of Onset   Cancer Mother    Diabetes Father    Cancer Brother    Breast cancer Maternal Aunt    Parkinsonism Neg Hx      Current Outpatient Medications:    ACCU-CHEK FASTCLIX LANCETS  MISC, USE TO CHECK BLOOD SUGARS TWICE DAILY, Disp: 100 each, Rfl: 11   Alcohol Swabs (B-D SINGLE USE SWABS REGULAR) PADS, USE AS NEEDED., Disp: 100 each, Rfl: 0   aspirin EC 81 MG tablet, Take 1 tablet (81 mg total) by mouth daily. (Patient taking differently: Take 81 mg by mouth See admin instructions. Take 81 mg by mouth between 8 AM-10 AM), Disp: 90 tablet, Rfl: 1   atorvastatin (LIPITOR) 10 MG tablet, TAKE 1 TABLET EVERY DAY, Disp: 90 tablet, Rfl: 3   Biotin 10 MG CAPS, Take 1 tablet by mouth daily. (Patient taking differently: Take 10 mg by mouth See admin instructions. Take 10 mg by mouth at 10:30 AM), Disp: 90 capsule, Rfl: 1   Blood Glucose Monitoring Suppl (TRUE METRIX AIR GLUCOSE METER) DEVI, USE AS DIRECTED TO CHECK BLOOD SUGARS., Disp: 1 each, Rfl: 0   Blood Glucose Monitoring Suppl (TRUE METRIX METER) w/Device KIT, Use as directed to check blood sugar once daily, Disp: 1 kit, Rfl: 1   calcium carbonate (OSCAL) 1500 (600 Ca) MG TABS tablet, Take 1 tablet (1,500 mg total) by mouth daily with breakfast. (Patient taking differently: Take 1,500 mg by mouth See admin instructions. Take 1,500 mg by mouth at 10:30 AM), Disp: 90 tablet, Rfl: 1   Cholecalciferol (VITAMIN D3) 125 MCG (5000 UT) CAPS, Take 5,000 Units by mouth See admin instructions. Take 5,000 units by  mouth at 10:30 AM, Disp: , Rfl:    clopidogrel (PLAVIX) 75 MG tablet, Take 75 mg by mouth See admin instructions. Take 75 mg by mouth at 3:30 PM, Disp: , Rfl:    diclofenac Sodium (VOLTAREN) 1 % GEL, APPLY 2 GRAMS TOPICALLY TO THE AFFECTED AREA FOUR TIMES DAILY (Patient taking differently: Apply 2 g topically See admin instructions. Apply 2 grams to the neck, under the breasts, and to the back 2 times a day), Disp: 100 g, Rfl: 2   fluticasone (FLONASE) 50 MCG/ACT nasal spray, Place 2 sprays into both nostrils daily. (Patient taking differently: Place 2 sprays into both nostrils in the morning.), Disp: 16 g, Rfl: 2   glucose blood (TRUE  METRIX BLOOD GLUCOSE TEST) test strip, Use as instructed, Disp: 100 each, Rfl: 12   Insulin Degludec-Liraglutide (XULTOPHY) 100-3.6 UNIT-MG/ML SOPN, INJECT 8UNITS SUBCUTANEOUSLY DAILY. (Patient taking differently: INJECT 8 UNITS SUBCUTANEOUSLY DAILY. PER RS 03/09/23.), Disp: , Rfl:    Insulin Pen Needle (B-D ULTRAFINE III SHORT PEN) 31G X 8 MM MISC, Use as directed, Disp: 100 each, Rfl: 11   loratadine (CLARITIN) 10 MG tablet, Take 1 tablet by mouth daily (Patient taking differently: Take 10 mg by mouth in the morning. Take 10 mg by mouth at 10:30 AM), Disp: 90 tablet, Rfl: 1   MAGNESIUM PO, Take 1 tablet by mouth at bedtime., Disp: , Rfl:    metFORMIN (GLUCOPHAGE) 500 MG tablet, TAKE ONE TABLET BY MOUTH EVERYDAY AT BEDTIME (Patient taking differently: Take 500 mg by mouth See admin instructions. Take 500 mg by mouth at 3 PM), Disp: 180 tablet, Rfl: 1   Multiple Vitamins-Minerals (ONE-A-DAY WOMENS 50+ ADVANTAGE) TABS, Take 1 tablet by mouth daily (Patient taking differently: Take 1 tablet by mouth See admin instructions. Take 1 tablet by mouth at 10:30 AM), Disp: 90 tablet, Rfl: 1   Omega-3 Fatty Acids (FISH OIL) 1000 MG CAPS, Take 1 capsule by mouth daily (Patient taking differently: Take 1,000 mg by mouth See admin instructions. Take 1,000 mg by mouth at 3 PM), Disp: 90 capsule, Rfl: 1   omeprazole (PRILOSEC) 40 MG capsule, Take 1 capsule (40 mg total) by mouth daily before breakfast., Disp: 90 capsule, Rfl: 2   rivastigmine (EXELON) 1.5 MG capsule, Take 1 capsule (1.5 mg total) by mouth 2 (two) times daily. (Patient taking differently: Take 1.5 mg by mouth See admin instructions. Take 1.5 mg by mouth in the morning and at 3 PM), Disp: 60 capsule, Rfl: 5   TRUEplus Lancets 28G MISC, USE AS DIRECTED TO CHECK BLOOD SUGARS., Disp: 100 each, Rfl: 1   Turmeric 500 MG CAPS, Take 1 capsule by mouth 2 (two) times daily. (Patient taking differently: Take 500 mg by mouth See admin instructions. Take 500 mg by  mouth at 7 AM and 4:30 PM), Disp: 180 capsule, Rfl: 1   TYLENOL 8 HOUR ARTHRITIS PAIN 650 MG CR tablet, Take 1,300 mg by mouth at bedtime., Disp: , Rfl:    vitamin C (ASCORBIC ACID) 500 MG tablet, Take 1 tablet (500 mg total) by mouth daily. (Patient taking differently: Take 500 mg by mouth See admin instructions. Take 500 mg by mouth at 10:30 AM), Disp: 90 tablet, Rfl: 1   carbidopa-levodopa (SINEMET IR) 25-100 MG tablet, Take 1 tablet by mouth 4 (four) times daily (1 tablet before breakfast, 1 tablet at noon, 1 tablet in the evening, and 1 tablet at bedtime)., Disp: 90 tablet, Rfl: 2   telmisartan-hydrochlorothiazide (MICARDIS HCT) 40-12.5 MG  tablet, Take 1 tablet by mouth every morning., Disp: 90 tablet, Rfl: 1   No Known Allergies   Review of Systems  Constitutional: Negative.   Eyes: Negative.   Respiratory: Negative.    Cardiovascular: Negative.   Gastrointestinal: Negative.   Musculoskeletal: Negative.   Skin: Negative.   Psychiatric/Behavioral: Negative.       Today's Vitals   03/14/23 1549  BP: 132/60  Pulse: (!) 102  Temp: 98.1 F (36.7 C)  Weight: 149 lb 9.6 oz (67.9 kg)  Height: 5\' 2"  (1.575 m)  PainSc: 0-No pain   Body mass index is 27.36 kg/m.  Wt Readings from Last 3 Encounters:  03/14/23 149 lb 9.6 oz (67.9 kg)  03/02/23 140 lb (63.5 kg)  11/24/22 141 lb 12.8 oz (64.3 kg)    The 10-year ASCVD risk score (Arnett DK, et al., 2019) is: 24.4%   Values used to calculate the score:     Age: 72 years     Sex: Female     Is Non-Hispanic African American: Yes     Diabetic: Yes     Tobacco smoker: No     Systolic Blood Pressure: 132 mmHg     Is BP treated: Yes     HDL Cholesterol: 71 mg/dL     Total Cholesterol: 155 mg/dL  Objective:  Physical Exam Vitals and nursing note reviewed.  Constitutional:      Appearance: Normal appearance.  HENT:     Head: Normocephalic and atraumatic.  Eyes:     Extraocular Movements: Extraocular movements intact.   Cardiovascular:     Rate and Rhythm: Normal rate and regular rhythm.     Heart sounds: Normal heart sounds.  Pulmonary:     Effort: Pulmonary effort is normal.     Breath sounds: Normal breath sounds.  Skin:    General: Skin is warm.  Neurological:     General: No focal deficit present.     Mental Status: She is alert.  Psychiatric:        Mood and Affect: Mood normal.        Behavior: Behavior normal.         Assessment And Plan:  Atypical syncope Assessment & Plan: TCM PERFORMED. A MEMBER OF THE CLINICAL TEAM SPOKE WITH THE PATIENT UPON DISCHARGE. DISCHARGE SUMMARY WAS REVIEWED IN FULL DETAIL DURING THE VISIT. MEDS RECONCILED AND COMPARED TO DISCHARGE MEDS. MEDICATION LIST WAS UPDATED AND REVIEWED WITH THE PATIENT. GREATER THAN 50% FACE TO FACE TIME WAS SPENT IN COUNSELING AND COORDINATION OF CARE. ALL QUESTIONS WERE ANSWERED TO THE SATISFACTION OF THE PATIENT.  Importance of adequate hydration was discussed with the patient. She will check her blood pressures at home.    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 She will continue with Sinemet and rivastigmine.    Anemia, unspecified type Assessment & Plan: I will check repeat CBC today. She denies seeing blood in her stools.   Orders: -     CBC -     Iron, TIBC and Ferritin Panel  Benign hypertension with chronic kidney disease, stage II [I12.9, N18.2] Assessment & Plan: Chronic, now off of meds as per d/c summary. She is encouraged to check her blood pressures at home. All questions were answered to her satisfaction. 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     Diabetes mellitus with stage 2 chronic  kidney disease (HCC) [E11.22, N18.2] Assessment & Plan: We discussed need for bedtime snack. She will decrease to Xultophy 10units nightly. If her sugars continue to be below 80, she  agrees to switch to Ozempic. I will start her at 0.25mg  weekly, she was advised how to self administer the medication. She will stay on -.25mg  for 4 weeks, then increase to 0.5mg  weekly. She is advised that her sugars may initially increase, but should improve over time. She and sister both confirm that there is no family history of thyroid cancer. All questions were answered to their satisfaction.    Supranuclear palsy (HCC) -     Ambulatory referral to Ophthalmology  Drug therapy -     BMP8+EGFR    Return if symptoms worsen or fail to improve.  Patient was given opportunity to ask questions. Patient verbalized understanding of the plan and was able to repeat key elements of the plan. All questions were answered to their satisfaction.    I, [name], MD, have reviewed all documentation for this visit. The documentation on 03/14/23 for the exam, diagnosis, procedures, and orders are all accurate and complete.  IF YOU HAVE BEEN REFERRED TO A SPECIALIST, IT MAY TAKE 1-2 WEEKS TO SCHEDULE/PROCESS THE REFERRAL. IF YOU HAVE NOT HEARD FROM US/SPECIALIST IN TWO WEEKS, PLEASE GIVE [place] A CALL AT [phone].

## 2023-03-14 NOTE — Patient Instructions (Signed)
 Semaglutide Injection What is this medication? SEMAGLUTIDE (SEM a GLOO tide) treats type 2 diabetes. It works by increasing insulin levels in your body, which decreases your blood sugar (glucose).   It also reduces the amount of sugar released into your blood and slows down your digestion. It may also be used to lower the risk of heart attack and stroke in people with type 2 diabetes. Changes to diet and exercise are often combined with this medication. This medicine may be used for other purposes; ask your health care provider or pharmacist if you have questions. COMMON BRAND NAME(S): OZEMPIC What should I tell my care team before I take this medication? They need to know if you have any of these conditions: Endocrine tumors (MEN 2) or if someone in your family had these tumors Eye disease, vision problems History of pancreatitis Kidney disease Stomach problems Thyroid cancer or if someone in your family had thyroid cancer An unusual or allergic reaction to semaglutide, other medications, foods, dyes, or preservatives Pregnant or trying to get pregnant Breast-feeding How should I use this medication? This medication is for injection under the skin of your upper leg (thigh), stomach area, or upper arm. It is given once every week (every 7 days). You will be taught how to prepare and give this medication. Use exactly as directed. Take your medication at regular intervals. Do not take it more often than directed. If you use this medication with insulin, you should inject this medication and the insulin separately. Do not mix them together. Do not give the injections right next to each other. Change (rotate) injection sites with each injection. It is important that you put your used needles and syringes in a special sharps container. Do not put them in a trash can. If you do not have a sharps container, call your pharmacist or care team to get one. A special MedGuide will be given to you by the  pharmacist with each prescription and refill. Be sure to read this information carefully each time. This medication comes with INSTRUCTIONS FOR USE. Ask your pharmacist for directions on how to use this medication. Read the information carefully. Talk to your pharmacist or care team if you have questions. Talk to your care team about the use of this medication in children. Special care may be needed. Overdosage: If you think you have taken too much of this medicine contact a poison control center or emergency room at once. NOTE: This medicine is only for you. Do not share this medicine with others. What if I miss a dose? If you miss a dose, take it as soon as you can within 5 days after the missed dose. Then take your next dose at your regular weekly time. If it has been longer than 5 days after the missed dose, do not take the missed dose. Take the next dose at your regular time. Do not take double or extra doses. If you have questions about a missed dose, contact your care team for advice. What may interact with this medication? Other medications for diabetes Many medications may cause changes in blood sugar, these include: Alcohol containing beverages Antiviral medications for HIV or AIDS Aspirin and aspirin-like medications Certain medications for blood pressure, heart disease, irregular heart beat Chromium Diuretics Female hormones, such as estrogens or progestins, birth control pills Fenofibrate Gemfibrozil Isoniazid Lanreotide Female hormones or anabolic steroids MAOIs like Carbex, Eldepryl, Marplan, Nardil, and Parnate Medications for weight loss Medications for allergies, asthma, cold, or cough Medications  for depression, anxiety, or psychotic disturbances Niacin Nicotine NSAIDs, medications for pain and inflammation, like ibuprofen or naproxen Octreotide Pasireotide Pentamidine Phenytoin Probenecid Quinolone antibiotics such as ciprofloxacin, levofloxacin, ofloxacin Some  herbal dietary supplements Steroid medications such as prednisone or cortisone Sulfamethoxazole; trimethoprim Thyroid hormones Some medications can hide the warning symptoms of low blood sugar (hypoglycemia). You may need to monitor your blood sugar more closely if you are taking one of these medications. These include: Beta-blockers, often used for high blood pressure or heart problems (examples include atenolol, metoprolol, propranolol) Clonidine Guanethidine Reserpine This list may not describe all possible interactions. Give your health care provider a list of all the medicines, herbs, non-prescription drugs, or dietary supplements you use. Also tell them if you smoke, drink alcohol, or use illegal drugs. Some items may interact with your medicine. What should I watch for while using this medication? Visit your care team for regular checks on your progress. Drink plenty of fluids while taking this medication. Check with your care team if you get an attack of severe diarrhea, nausea, and vomiting. The loss of too much body fluid can make it dangerous for you to take this medication. A test called the HbA1C (A1C) will be monitored. This is a simple blood test. It measures your blood sugar control over the last 2 to 3 months. You will receive this test every 3 to 6 months. Learn how to check your blood sugar. Learn the symptoms of low and high blood sugar and how to manage them. Always carry a quick-source of sugar with you in case you have symptoms of low blood sugar. Examples include hard sugar candy or glucose tablets. Make sure others know that you can choke if you eat or drink when you develop serious symptoms of low blood sugar, such as seizures or unconsciousness. They must get medical help at once. Tell your care team if you have high blood sugar. You might need to change the dose of your medication. If you are sick or exercising more than usual, you might need to change the dose of your  medication. Do not skip meals. Ask your care team if you should avoid alcohol. Many nonprescription cough and cold products contain sugar or alcohol. These can affect blood sugar. Pens should never be shared. Even if the needle is changed, sharing may result in passing of viruses like hepatitis or HIV. Wear a medical ID bracelet or chain, and carry a card that describes your disease and details of your medication and dosage times. What side effects may I notice from receiving this medication? Side effects that you should report to your care team as soon as possible: Allergic reactions--skin rash, itching, hives, swelling of the face, lips, tongue, or throat Change in vision Dehydration--increased thirst, dry mouth, feeling faint or lightheaded, headache, dark yellow or brown urine Gallbladder problems--severe stomach pain, nausea, vomiting, fever Heart palpitations--rapid, pounding, or irregular heartbeat Kidney injury--decrease in the amount of urine, swelling of the ankles, hands, or feet Pancreatitis--severe stomach pain that spreads to your back or gets worse after eating or when touched, fever, nausea, vomiting Thoughts of suicide or self-harm, worsening mood, feelings of depression Thyroid cancer--new mass or lump in the neck, pain or trouble swallowing, trouble breathing, hoarseness Side effects that usually do not require medical attention (report these to your care team if they continue or are bothersome): Diarrhea Loss of appetite Nausea Upset stomach This list may not describe all possible side effects. Call your doctor for medical  advice about side effects. You may report side effects to FDA at 1-800-FDA-1088. Where should I keep my medication? Keep out of the reach of children. Store unopened pens in a refrigerator between 2 and 8 degrees C (36 and 46 degrees F). Do not freeze. Protect from light and heat. After you first use the pen, it can be stored for 56 days at room  temperature between 15 and 30 degrees C (59 and 86 degrees F) or in a refrigerator. Throw away your used pen after 56 days or after the expiration date, whichever comes first. Do not store your pen with the needle attached. If the needle is left on, medication may leak from the pen. NOTE: This sheet is a summary. It may not cover all possible information. If you have questions about this medicine, talk to your doctor, pharmacist, or health care provider.  2024 Elsevier/Gold Standard (2022-06-28 00:00:00)

## 2023-03-14 NOTE — Telephone Encounter (Signed)
Pt requested the contact information for her DME

## 2023-03-15 ENCOUNTER — Inpatient Hospital Stay: Payer: Medicare PPO | Admitting: Internal Medicine

## 2023-03-15 LAB — IRON,TIBC AND FERRITIN PANEL
Ferritin: 58 ng/mL (ref 15–150)
Iron Saturation: 13 % — ABNORMAL LOW (ref 15–55)
Iron: 39 ug/dL (ref 27–139)
Total Iron Binding Capacity: 303 ug/dL (ref 250–450)
UIBC: 264 ug/dL (ref 118–369)

## 2023-03-15 LAB — BMP8+EGFR
BUN/Creatinine Ratio: 23 (ref 12–28)
BUN: 19 mg/dL (ref 8–27)
CO2: 22 mmol/L (ref 20–29)
Calcium: 9.7 mg/dL (ref 8.7–10.3)
Chloride: 108 mmol/L — ABNORMAL HIGH (ref 96–106)
Creatinine, Ser: 0.84 mg/dL (ref 0.57–1.00)
Glucose: 155 mg/dL — ABNORMAL HIGH (ref 70–99)
Potassium: 4.3 mmol/L (ref 3.5–5.2)
Sodium: 143 mmol/L (ref 134–144)
eGFR: 74 mL/min/{1.73_m2} (ref >59)

## 2023-03-15 LAB — CBC
Hematocrit: 35.7 % (ref 34.0–46.6)
Hemoglobin: 11.4 g/dL (ref 11.1–15.9)
MCH: 27.7 pg (ref 26.6–33.0)
MCHC: 31.9 g/dL (ref 31.5–35.7)
MCV: 87 fL (ref 79–97)
Platelets: 397 10*3/uL (ref 150–450)
RBC: 4.12 x10E6/uL (ref 3.77–5.28)
RDW: 13.4 % (ref 11.7–15.4)
WBC: 8.6 10*3/uL (ref 3.4–10.8)

## 2023-03-17 DIAGNOSIS — E1151 Type 2 diabetes mellitus with diabetic peripheral angiopathy without gangrene: Secondary | ICD-10-CM | POA: Diagnosis not present

## 2023-03-17 DIAGNOSIS — G4733 Obstructive sleep apnea (adult) (pediatric): Secondary | ICD-10-CM | POA: Diagnosis not present

## 2023-03-17 DIAGNOSIS — G231 Progressive supranuclear ophthalmoplegia [Steele-Richardson-Olszewski]: Secondary | ICD-10-CM | POA: Diagnosis not present

## 2023-03-17 DIAGNOSIS — N182 Chronic kidney disease, stage 2 (mild): Secondary | ICD-10-CM | POA: Diagnosis not present

## 2023-03-17 DIAGNOSIS — G20A1 Parkinson's disease without dyskinesia, without mention of fluctuations: Secondary | ICD-10-CM | POA: Diagnosis not present

## 2023-03-17 DIAGNOSIS — G40909 Epilepsy, unspecified, not intractable, without status epilepticus: Secondary | ICD-10-CM | POA: Diagnosis not present

## 2023-03-17 DIAGNOSIS — E1122 Type 2 diabetes mellitus with diabetic chronic kidney disease: Secondary | ICD-10-CM | POA: Diagnosis not present

## 2023-03-17 DIAGNOSIS — I129 Hypertensive chronic kidney disease with stage 1 through stage 4 chronic kidney disease, or unspecified chronic kidney disease: Secondary | ICD-10-CM | POA: Diagnosis not present

## 2023-03-17 DIAGNOSIS — D75839 Thrombocytosis, unspecified: Secondary | ICD-10-CM | POA: Diagnosis not present

## 2023-03-17 DIAGNOSIS — G20C Parkinsonism, unspecified: Secondary | ICD-10-CM | POA: Diagnosis not present

## 2023-03-17 NOTE — Progress Notes (Signed)
   03/17/2023  Patient ID: April Larson, female   DOB: 1951-04-01, 72 y.o.   MRN: [id number]  Clinic routed request from patient's PCP, Dr. Allyne Gee, to discuss patient's diabetes management.  Patient has experience hypoglycemia that could be related to decreased oral intake.  Dr. Allyne Gee is considering stopping Xultophy and starting Ozempic 0.25mg  weekly x2 weeks, then increasing to 0.5mg  weekly.  Informed Dr. Allyne Gee that I agree with her plan, as long as patient has no history of pancreatitis or personal/family history of medullary thyroid cancer. I would just make sure to monitor weight if there is already concern with oral intake.  Forwarding note to [employer], PharmD that works with Dr. Allyne Gee.  Lenna Gilford, PharmD, DPLA

## 2023-03-21 ENCOUNTER — Other Ambulatory Visit: Payer: Self-pay

## 2023-03-22 ENCOUNTER — Other Ambulatory Visit: Payer: Self-pay

## 2023-03-22 DIAGNOSIS — G4733 Obstructive sleep apnea (adult) (pediatric): Secondary | ICD-10-CM | POA: Diagnosis not present

## 2023-03-22 DIAGNOSIS — I129 Hypertensive chronic kidney disease with stage 1 through stage 4 chronic kidney disease, or unspecified chronic kidney disease: Secondary | ICD-10-CM | POA: Diagnosis not present

## 2023-03-22 DIAGNOSIS — G20A1 Parkinson's disease without dyskinesia, without mention of fluctuations: Secondary | ICD-10-CM | POA: Diagnosis not present

## 2023-03-22 DIAGNOSIS — D75839 Thrombocytosis, unspecified: Secondary | ICD-10-CM | POA: Diagnosis not present

## 2023-03-22 DIAGNOSIS — N182 Chronic kidney disease, stage 2 (mild): Secondary | ICD-10-CM | POA: Diagnosis not present

## 2023-03-22 DIAGNOSIS — G231 Progressive supranuclear ophthalmoplegia [Steele-Richardson-Olszewski]: Secondary | ICD-10-CM | POA: Diagnosis not present

## 2023-03-22 DIAGNOSIS — E1122 Type 2 diabetes mellitus with diabetic chronic kidney disease: Secondary | ICD-10-CM | POA: Diagnosis not present

## 2023-03-22 DIAGNOSIS — G40909 Epilepsy, unspecified, not intractable, without status epilepticus: Secondary | ICD-10-CM | POA: Diagnosis not present

## 2023-03-22 DIAGNOSIS — E1151 Type 2 diabetes mellitus with diabetic peripheral angiopathy without gangrene: Secondary | ICD-10-CM | POA: Diagnosis not present

## 2023-03-23 ENCOUNTER — Other Ambulatory Visit: Payer: Self-pay

## 2023-03-23 ENCOUNTER — Other Ambulatory Visit: Payer: Self-pay | Admitting: Nurse Practitioner

## 2023-03-23 ENCOUNTER — Other Ambulatory Visit (HOSPITAL_COMMUNITY): Payer: Self-pay

## 2023-03-23 ENCOUNTER — Other Ambulatory Visit: Payer: Self-pay | Admitting: Internal Medicine

## 2023-03-24 ENCOUNTER — Other Ambulatory Visit: Payer: Self-pay

## 2023-03-24 ENCOUNTER — Other Ambulatory Visit (HOSPITAL_COMMUNITY): Payer: Self-pay

## 2023-03-24 ENCOUNTER — Other Ambulatory Visit: Payer: Self-pay | Admitting: Internal Medicine

## 2023-03-24 MED ORDER — CARBIDOPA-LEVODOPA 25-100 MG PO TABS
1.0000 | ORAL_TABLET | Freq: Four times a day (QID) | ORAL | 2 refills | Status: DC
  Filled 2023-03-24 – 2023-03-31 (×4): qty 120, 30d supply, fill #0
  Filled 2023-04-08 – 2023-04-26 (×3): qty 120, 30d supply, fill #1
  Filled 2023-05-16: qty 30, 8d supply, fill #2

## 2023-03-24 MED FILL — Carbidopa & Levodopa Tab 25-100 MG: ORAL | 30 days supply | Qty: 120 | Fill #2 | Status: CN

## 2023-03-24 MED FILL — Atorvastatin Calcium Tab 10 MG (Base Equivalent): ORAL | 30 days supply | Qty: 30 | Fill #0 | Status: CN

## 2023-03-25 ENCOUNTER — Other Ambulatory Visit: Payer: Self-pay

## 2023-03-25 MED FILL — Telmisartan-Hydrochlorothiazide Tab 40-12.5 MG: ORAL | 30 days supply | Qty: 30 | Fill #0 | Status: CN

## 2023-03-28 ENCOUNTER — Other Ambulatory Visit: Payer: Self-pay

## 2023-03-28 MED FILL — Atorvastatin Calcium Tab 10 MG (Base Equivalent): ORAL | 90 days supply | Qty: 90 | Fill #0 | Status: CN

## 2023-03-31 ENCOUNTER — Other Ambulatory Visit: Payer: Self-pay

## 2023-03-31 ENCOUNTER — Other Ambulatory Visit (HOSPITAL_COMMUNITY): Payer: Self-pay

## 2023-03-31 MED FILL — Telmisartan-Hydrochlorothiazide Tab 40-12.5 MG: ORAL | 30 days supply | Qty: 30 | Fill #0 | Status: AC

## 2023-03-31 MED FILL — Telmisartan-Hydrochlorothiazide Tab 40-12.5 MG: ORAL | 30 days supply | Qty: 30 | Fill #0 | Status: CN

## 2023-03-31 MED FILL — Atorvastatin Calcium Tab 10 MG (Base Equivalent): ORAL | 30 days supply | Qty: 30 | Fill #0 | Status: CN

## 2023-04-02 ENCOUNTER — Other Ambulatory Visit: Payer: Self-pay | Admitting: Neurology

## 2023-04-03 DIAGNOSIS — G231 Progressive supranuclear ophthalmoplegia [Steele-Richardson-Olszewski]: Secondary | ICD-10-CM | POA: Insufficient documentation

## 2023-04-03 DIAGNOSIS — D649 Anemia, unspecified: Secondary | ICD-10-CM | POA: Insufficient documentation

## 2023-04-03 NOTE — Assessment & Plan Note (Signed)
Chronic, now off of meds as per d/c summary. She is encouraged to check her blood pressures at home. All questions were answered to her satisfaction. 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

## 2023-04-03 NOTE — Assessment & Plan Note (Signed)
TCM PERFORMED. A MEMBER OF THE CLINICAL TEAM SPOKE WITH THE PATIENT UPON DISCHARGE. DISCHARGE SUMMARY WAS REVIEWED IN FULL DETAIL DURING THE VISIT. MEDS RECONCILED AND COMPARED TO DISCHARGE MEDS. MEDICATION LIST WAS UPDATED AND REVIEWED WITH THE PATIENT. GREATER THAN 50% FACE TO FACE TIME WAS SPENT IN COUNSELING AND COORDINATION OF CARE. ALL QUESTIONS WERE ANSWERED TO THE SATISFACTION OF THE PATIENT.  Importance of adequate hydration was discussed with the patient. She will check her blood pressures at home.

## 2023-04-03 NOTE — Assessment & Plan Note (Signed)
I will check repeat CBC today. She denies seeing blood in her stools.

## 2023-04-03 NOTE — Assessment & Plan Note (Signed)
We discussed need for bedtime snack. She will decrease to Xultophy 10units nightly. If her sugars continue to be below 80, she agrees to switch to Ozempic. I will start her at 0.25mg  weekly, she was advised how to self administer the medication. She will stay on -.25mg  for 4 weeks, then increase to 0.5mg  weekly. She is advised that her sugars may initially increase, but should improve over time. She and sister both confirm that there is no family history of thyroid cancer. All questions were answered to their satisfaction.

## 2023-04-03 NOTE — Assessment & Plan Note (Signed)
Chronic, Neurology input is appreciated. She will continue with Sinemet and rivastigmine.

## 2023-04-04 ENCOUNTER — Other Ambulatory Visit (HOSPITAL_BASED_OUTPATIENT_CLINIC_OR_DEPARTMENT_OTHER): Payer: Self-pay

## 2023-04-05 DIAGNOSIS — G231 Progressive supranuclear ophthalmoplegia [Steele-Richardson-Olszewski]: Secondary | ICD-10-CM | POA: Diagnosis not present

## 2023-04-05 DIAGNOSIS — E1151 Type 2 diabetes mellitus with diabetic peripheral angiopathy without gangrene: Secondary | ICD-10-CM | POA: Diagnosis not present

## 2023-04-05 DIAGNOSIS — I129 Hypertensive chronic kidney disease with stage 1 through stage 4 chronic kidney disease, or unspecified chronic kidney disease: Secondary | ICD-10-CM | POA: Diagnosis not present

## 2023-04-05 DIAGNOSIS — G20A1 Parkinson's disease without dyskinesia, without mention of fluctuations: Secondary | ICD-10-CM | POA: Diagnosis not present

## 2023-04-05 DIAGNOSIS — D75839 Thrombocytosis, unspecified: Secondary | ICD-10-CM | POA: Diagnosis not present

## 2023-04-05 DIAGNOSIS — G40909 Epilepsy, unspecified, not intractable, without status epilepticus: Secondary | ICD-10-CM | POA: Diagnosis not present

## 2023-04-05 DIAGNOSIS — E1122 Type 2 diabetes mellitus with diabetic chronic kidney disease: Secondary | ICD-10-CM | POA: Diagnosis not present

## 2023-04-05 DIAGNOSIS — N182 Chronic kidney disease, stage 2 (mild): Secondary | ICD-10-CM | POA: Diagnosis not present

## 2023-04-05 DIAGNOSIS — G4733 Obstructive sleep apnea (adult) (pediatric): Secondary | ICD-10-CM | POA: Diagnosis not present

## 2023-04-08 ENCOUNTER — Other Ambulatory Visit: Payer: Self-pay

## 2023-04-08 MED FILL — Atorvastatin Calcium Tab 10 MG (Base Equivalent): ORAL | 90 days supply | Qty: 90 | Fill #0 | Status: CN

## 2023-04-08 MED FILL — Telmisartan-Hydrochlorothiazide Tab 40-12.5 MG: ORAL | 30 days supply | Qty: 30 | Fill #1 | Status: CN

## 2023-04-11 ENCOUNTER — Other Ambulatory Visit: Payer: Self-pay

## 2023-04-11 DIAGNOSIS — Z7982 Long term (current) use of aspirin: Secondary | ICD-10-CM | POA: Diagnosis not present

## 2023-04-11 DIAGNOSIS — G231 Progressive supranuclear ophthalmoplegia [Steele-Richardson-Olszewski]: Secondary | ICD-10-CM | POA: Diagnosis not present

## 2023-04-11 DIAGNOSIS — Z794 Long term (current) use of insulin: Secondary | ICD-10-CM | POA: Diagnosis not present

## 2023-04-11 DIAGNOSIS — D75839 Thrombocytosis, unspecified: Secondary | ICD-10-CM | POA: Diagnosis not present

## 2023-04-11 DIAGNOSIS — E1122 Type 2 diabetes mellitus with diabetic chronic kidney disease: Secondary | ICD-10-CM | POA: Diagnosis not present

## 2023-04-11 DIAGNOSIS — Z7984 Long term (current) use of oral hypoglycemic drugs: Secondary | ICD-10-CM | POA: Diagnosis not present

## 2023-04-11 DIAGNOSIS — G20C Parkinsonism, unspecified: Secondary | ICD-10-CM | POA: Diagnosis not present

## 2023-04-11 DIAGNOSIS — Z87891 Personal history of nicotine dependence: Secondary | ICD-10-CM | POA: Diagnosis not present

## 2023-04-11 DIAGNOSIS — Z7902 Long term (current) use of antithrombotics/antiplatelets: Secondary | ICD-10-CM | POA: Diagnosis not present

## 2023-04-11 DIAGNOSIS — Z9181 History of falling: Secondary | ICD-10-CM | POA: Diagnosis not present

## 2023-04-11 DIAGNOSIS — I129 Hypertensive chronic kidney disease with stage 1 through stage 4 chronic kidney disease, or unspecified chronic kidney disease: Secondary | ICD-10-CM | POA: Diagnosis not present

## 2023-04-11 DIAGNOSIS — N182 Chronic kidney disease, stage 2 (mild): Secondary | ICD-10-CM | POA: Diagnosis not present

## 2023-04-11 DIAGNOSIS — E7800 Pure hypercholesterolemia, unspecified: Secondary | ICD-10-CM | POA: Diagnosis not present

## 2023-04-11 MED FILL — Atorvastatin Calcium Tab 10 MG (Base Equivalent): ORAL | 90 days supply | Qty: 90 | Fill #0 | Status: CN

## 2023-04-11 MED FILL — Telmisartan-Hydrochlorothiazide Tab 40-12.5 MG: ORAL | 30 days supply | Qty: 30 | Fill #1 | Status: CN

## 2023-04-13 ENCOUNTER — Ambulatory Visit: Payer: Medicare PPO | Admitting: Internal Medicine

## 2023-04-13 ENCOUNTER — Other Ambulatory Visit: Payer: Self-pay

## 2023-04-13 DIAGNOSIS — G20C Parkinsonism, unspecified: Secondary | ICD-10-CM | POA: Diagnosis not present

## 2023-04-13 DIAGNOSIS — I129 Hypertensive chronic kidney disease with stage 1 through stage 4 chronic kidney disease, or unspecified chronic kidney disease: Secondary | ICD-10-CM | POA: Diagnosis not present

## 2023-04-13 DIAGNOSIS — Z7984 Long term (current) use of oral hypoglycemic drugs: Secondary | ICD-10-CM | POA: Diagnosis not present

## 2023-04-13 DIAGNOSIS — Z7982 Long term (current) use of aspirin: Secondary | ICD-10-CM | POA: Diagnosis not present

## 2023-04-13 DIAGNOSIS — Z87891 Personal history of nicotine dependence: Secondary | ICD-10-CM | POA: Diagnosis not present

## 2023-04-13 DIAGNOSIS — D75839 Thrombocytosis, unspecified: Secondary | ICD-10-CM | POA: Diagnosis not present

## 2023-04-13 DIAGNOSIS — E1122 Type 2 diabetes mellitus with diabetic chronic kidney disease: Secondary | ICD-10-CM | POA: Diagnosis not present

## 2023-04-13 DIAGNOSIS — Z794 Long term (current) use of insulin: Secondary | ICD-10-CM | POA: Diagnosis not present

## 2023-04-13 DIAGNOSIS — Z7902 Long term (current) use of antithrombotics/antiplatelets: Secondary | ICD-10-CM | POA: Diagnosis not present

## 2023-04-13 DIAGNOSIS — E7800 Pure hypercholesterolemia, unspecified: Secondary | ICD-10-CM | POA: Diagnosis not present

## 2023-04-13 DIAGNOSIS — Z9181 History of falling: Secondary | ICD-10-CM | POA: Diagnosis not present

## 2023-04-13 DIAGNOSIS — G231 Progressive supranuclear ophthalmoplegia [Steele-Richardson-Olszewski]: Secondary | ICD-10-CM | POA: Diagnosis not present

## 2023-04-13 DIAGNOSIS — N182 Chronic kidney disease, stage 2 (mild): Secondary | ICD-10-CM | POA: Diagnosis not present

## 2023-04-20 DIAGNOSIS — R1312 Dysphagia, oropharyngeal phase: Secondary | ICD-10-CM | POA: Diagnosis not present

## 2023-04-20 DIAGNOSIS — G4733 Obstructive sleep apnea (adult) (pediatric): Secondary | ICD-10-CM | POA: Diagnosis not present

## 2023-04-20 DIAGNOSIS — G231 Progressive supranuclear ophthalmoplegia [Steele-Richardson-Olszewski]: Secondary | ICD-10-CM | POA: Diagnosis not present

## 2023-04-20 DIAGNOSIS — Z794 Long term (current) use of insulin: Secondary | ICD-10-CM | POA: Diagnosis not present

## 2023-04-20 DIAGNOSIS — Z7982 Long term (current) use of aspirin: Secondary | ICD-10-CM | POA: Diagnosis not present

## 2023-04-20 DIAGNOSIS — G20A1 Parkinson's disease without dyskinesia, without mention of fluctuations: Secondary | ICD-10-CM | POA: Diagnosis not present

## 2023-04-20 DIAGNOSIS — I129 Hypertensive chronic kidney disease with stage 1 through stage 4 chronic kidney disease, or unspecified chronic kidney disease: Secondary | ICD-10-CM | POA: Diagnosis not present

## 2023-04-20 DIAGNOSIS — E1151 Type 2 diabetes mellitus with diabetic peripheral angiopathy without gangrene: Secondary | ICD-10-CM | POA: Diagnosis not present

## 2023-04-20 DIAGNOSIS — Z87891 Personal history of nicotine dependence: Secondary | ICD-10-CM | POA: Diagnosis not present

## 2023-04-20 DIAGNOSIS — Z9181 History of falling: Secondary | ICD-10-CM | POA: Diagnosis not present

## 2023-04-20 DIAGNOSIS — E7800 Pure hypercholesterolemia, unspecified: Secondary | ICD-10-CM | POA: Diagnosis not present

## 2023-04-20 DIAGNOSIS — D75839 Thrombocytosis, unspecified: Secondary | ICD-10-CM | POA: Diagnosis not present

## 2023-04-20 DIAGNOSIS — Z79899 Other long term (current) drug therapy: Secondary | ICD-10-CM | POA: Diagnosis not present

## 2023-04-20 DIAGNOSIS — Z556 Problems related to health literacy: Secondary | ICD-10-CM | POA: Diagnosis not present

## 2023-04-20 DIAGNOSIS — N182 Chronic kidney disease, stage 2 (mild): Secondary | ICD-10-CM | POA: Diagnosis not present

## 2023-04-20 DIAGNOSIS — Z7902 Long term (current) use of antithrombotics/antiplatelets: Secondary | ICD-10-CM | POA: Diagnosis not present

## 2023-04-20 DIAGNOSIS — E1122 Type 2 diabetes mellitus with diabetic chronic kidney disease: Secondary | ICD-10-CM | POA: Diagnosis not present

## 2023-04-20 DIAGNOSIS — Z7985 Long-term (current) use of injectable non-insulin antidiabetic drugs: Secondary | ICD-10-CM | POA: Diagnosis not present

## 2023-04-25 DIAGNOSIS — E1122 Type 2 diabetes mellitus with diabetic chronic kidney disease: Secondary | ICD-10-CM | POA: Diagnosis not present

## 2023-04-25 DIAGNOSIS — G4733 Obstructive sleep apnea (adult) (pediatric): Secondary | ICD-10-CM | POA: Diagnosis not present

## 2023-04-25 DIAGNOSIS — D75839 Thrombocytosis, unspecified: Secondary | ICD-10-CM | POA: Diagnosis not present

## 2023-04-25 DIAGNOSIS — I129 Hypertensive chronic kidney disease with stage 1 through stage 4 chronic kidney disease, or unspecified chronic kidney disease: Secondary | ICD-10-CM | POA: Diagnosis not present

## 2023-04-25 DIAGNOSIS — Z7982 Long term (current) use of aspirin: Secondary | ICD-10-CM | POA: Diagnosis not present

## 2023-04-25 DIAGNOSIS — G20A1 Parkinson's disease without dyskinesia, without mention of fluctuations: Secondary | ICD-10-CM | POA: Diagnosis not present

## 2023-04-25 DIAGNOSIS — E1151 Type 2 diabetes mellitus with diabetic peripheral angiopathy without gangrene: Secondary | ICD-10-CM | POA: Diagnosis not present

## 2023-04-25 DIAGNOSIS — Z9181 History of falling: Secondary | ICD-10-CM | POA: Diagnosis not present

## 2023-04-25 DIAGNOSIS — G231 Progressive supranuclear ophthalmoplegia [Steele-Richardson-Olszewski]: Secondary | ICD-10-CM | POA: Diagnosis not present

## 2023-04-25 DIAGNOSIS — Z7985 Long-term (current) use of injectable non-insulin antidiabetic drugs: Secondary | ICD-10-CM | POA: Diagnosis not present

## 2023-04-25 DIAGNOSIS — Z794 Long term (current) use of insulin: Secondary | ICD-10-CM | POA: Diagnosis not present

## 2023-04-25 DIAGNOSIS — E7800 Pure hypercholesterolemia, unspecified: Secondary | ICD-10-CM | POA: Diagnosis not present

## 2023-04-25 DIAGNOSIS — Z79899 Other long term (current) drug therapy: Secondary | ICD-10-CM | POA: Diagnosis not present

## 2023-04-25 DIAGNOSIS — Z556 Problems related to health literacy: Secondary | ICD-10-CM | POA: Diagnosis not present

## 2023-04-25 DIAGNOSIS — Z7902 Long term (current) use of antithrombotics/antiplatelets: Secondary | ICD-10-CM | POA: Diagnosis not present

## 2023-04-25 DIAGNOSIS — R1312 Dysphagia, oropharyngeal phase: Secondary | ICD-10-CM | POA: Diagnosis not present

## 2023-04-25 DIAGNOSIS — Z87891 Personal history of nicotine dependence: Secondary | ICD-10-CM | POA: Diagnosis not present

## 2023-04-25 DIAGNOSIS — N182 Chronic kidney disease, stage 2 (mild): Secondary | ICD-10-CM | POA: Diagnosis not present

## 2023-04-26 ENCOUNTER — Other Ambulatory Visit: Payer: Self-pay

## 2023-04-26 ENCOUNTER — Other Ambulatory Visit (HOSPITAL_COMMUNITY): Payer: Self-pay

## 2023-04-26 MED FILL — Telmisartan-Hydrochlorothiazide Tab 40-12.5 MG: ORAL | 30 days supply | Qty: 30 | Fill #1 | Status: AC

## 2023-04-26 MED FILL — Atorvastatin Calcium Tab 10 MG (Base Equivalent): ORAL | 30 days supply | Qty: 30 | Fill #0 | Status: AC

## 2023-04-27 ENCOUNTER — Other Ambulatory Visit (INDEPENDENT_AMBULATORY_CARE_PROVIDER_SITE_OTHER): Payer: 59 | Admitting: Internal Medicine

## 2023-04-27 ENCOUNTER — Ambulatory Visit: Payer: Medicare PPO | Admitting: Internal Medicine

## 2023-04-27 DIAGNOSIS — E7800 Pure hypercholesterolemia, unspecified: Secondary | ICD-10-CM

## 2023-04-27 DIAGNOSIS — D75839 Thrombocytosis, unspecified: Secondary | ICD-10-CM

## 2023-04-27 DIAGNOSIS — G231 Progressive supranuclear ophthalmoplegia [Steele-Richardson-Olszewski]: Secondary | ICD-10-CM

## 2023-04-27 DIAGNOSIS — I129 Hypertensive chronic kidney disease with stage 1 through stage 4 chronic kidney disease, or unspecified chronic kidney disease: Secondary | ICD-10-CM

## 2023-04-27 DIAGNOSIS — N182 Chronic kidney disease, stage 2 (mild): Secondary | ICD-10-CM | POA: Diagnosis not present

## 2023-04-27 DIAGNOSIS — G20C Parkinsonism, unspecified: Secondary | ICD-10-CM

## 2023-04-27 DIAGNOSIS — R569 Unspecified convulsions: Secondary | ICD-10-CM | POA: Diagnosis not present

## 2023-04-27 DIAGNOSIS — R1312 Dysphagia, oropharyngeal phase: Secondary | ICD-10-CM | POA: Diagnosis not present

## 2023-04-27 DIAGNOSIS — E1122 Type 2 diabetes mellitus with diabetic chronic kidney disease: Secondary | ICD-10-CM | POA: Diagnosis not present

## 2023-04-27 NOTE — Progress Notes (Signed)
Cc: home health recertification  Received home health orders orders from [facility]. Start of care 12/16/22.   Certification and orders from 04/15/23 through 06/13/23 are reviewed, signed and faxed back to home health company.  Need of intermittent skilled services at home: [id number]  The home health care plan has been established by me and will be reviewed and updated as needed to maximize patient recovery.  I certify that all home health services have been and will be furnished to the patient while under my care.  Face-to-face encounter in which the need for home health services was established: 11/24/22  Patient is receiving home health services for the following diagnoses: Problem List Items Addressed This Visit       Cardiovascular and Mediastinum   Benign hypertension with chronic kidney disease, stage II     Endocrine   Diabetes mellitus with stage 2 chronic kidney disease (HCC)     Nervous and Auditory   Atypical parkinsonism (HCC)   Supranuclear palsy (HCC)     Hematopoietic and Hemostatic   Thrombocytosis     Other   Pure hypercholesterolemia - Primary   Seizure-like activity (HCC)   Other Visit Diagnoses       Oropharyngeal dysphagia [R13.12]            [name], MD

## 2023-05-04 DIAGNOSIS — G231 Progressive supranuclear ophthalmoplegia [Steele-Richardson-Olszewski]: Secondary | ICD-10-CM | POA: Diagnosis not present

## 2023-05-04 DIAGNOSIS — Z794 Long term (current) use of insulin: Secondary | ICD-10-CM | POA: Diagnosis not present

## 2023-05-04 DIAGNOSIS — E1122 Type 2 diabetes mellitus with diabetic chronic kidney disease: Secondary | ICD-10-CM | POA: Diagnosis not present

## 2023-05-04 DIAGNOSIS — E1151 Type 2 diabetes mellitus with diabetic peripheral angiopathy without gangrene: Secondary | ICD-10-CM | POA: Diagnosis not present

## 2023-05-04 DIAGNOSIS — E7800 Pure hypercholesterolemia, unspecified: Secondary | ICD-10-CM | POA: Diagnosis not present

## 2023-05-04 DIAGNOSIS — Z9181 History of falling: Secondary | ICD-10-CM | POA: Diagnosis not present

## 2023-05-04 DIAGNOSIS — Z556 Problems related to health literacy: Secondary | ICD-10-CM | POA: Diagnosis not present

## 2023-05-04 DIAGNOSIS — Z7985 Long-term (current) use of injectable non-insulin antidiabetic drugs: Secondary | ICD-10-CM | POA: Diagnosis not present

## 2023-05-04 DIAGNOSIS — G4733 Obstructive sleep apnea (adult) (pediatric): Secondary | ICD-10-CM | POA: Diagnosis not present

## 2023-05-04 DIAGNOSIS — Z7902 Long term (current) use of antithrombotics/antiplatelets: Secondary | ICD-10-CM | POA: Diagnosis not present

## 2023-05-04 DIAGNOSIS — G20A1 Parkinson's disease without dyskinesia, without mention of fluctuations: Secondary | ICD-10-CM | POA: Diagnosis not present

## 2023-05-04 DIAGNOSIS — I129 Hypertensive chronic kidney disease with stage 1 through stage 4 chronic kidney disease, or unspecified chronic kidney disease: Secondary | ICD-10-CM | POA: Diagnosis not present

## 2023-05-04 DIAGNOSIS — R1312 Dysphagia, oropharyngeal phase: Secondary | ICD-10-CM | POA: Diagnosis not present

## 2023-05-04 DIAGNOSIS — N182 Chronic kidney disease, stage 2 (mild): Secondary | ICD-10-CM | POA: Diagnosis not present

## 2023-05-04 DIAGNOSIS — Z79899 Other long term (current) drug therapy: Secondary | ICD-10-CM | POA: Diagnosis not present

## 2023-05-04 DIAGNOSIS — D75839 Thrombocytosis, unspecified: Secondary | ICD-10-CM | POA: Diagnosis not present

## 2023-05-04 DIAGNOSIS — Z7982 Long term (current) use of aspirin: Secondary | ICD-10-CM | POA: Diagnosis not present

## 2023-05-04 DIAGNOSIS — Z87891 Personal history of nicotine dependence: Secondary | ICD-10-CM | POA: Diagnosis not present

## 2023-05-11 ENCOUNTER — Other Ambulatory Visit (HOSPITAL_BASED_OUTPATIENT_CLINIC_OR_DEPARTMENT_OTHER): Payer: Self-pay

## 2023-05-11 DIAGNOSIS — E7800 Pure hypercholesterolemia, unspecified: Secondary | ICD-10-CM | POA: Diagnosis not present

## 2023-05-11 DIAGNOSIS — Z7985 Long-term (current) use of injectable non-insulin antidiabetic drugs: Secondary | ICD-10-CM | POA: Diagnosis not present

## 2023-05-11 DIAGNOSIS — Z9181 History of falling: Secondary | ICD-10-CM | POA: Diagnosis not present

## 2023-05-11 DIAGNOSIS — Z87891 Personal history of nicotine dependence: Secondary | ICD-10-CM | POA: Diagnosis not present

## 2023-05-11 DIAGNOSIS — G231 Progressive supranuclear ophthalmoplegia [Steele-Richardson-Olszewski]: Secondary | ICD-10-CM | POA: Diagnosis not present

## 2023-05-11 DIAGNOSIS — Z794 Long term (current) use of insulin: Secondary | ICD-10-CM | POA: Diagnosis not present

## 2023-05-11 DIAGNOSIS — E1151 Type 2 diabetes mellitus with diabetic peripheral angiopathy without gangrene: Secondary | ICD-10-CM | POA: Diagnosis not present

## 2023-05-11 DIAGNOSIS — G4733 Obstructive sleep apnea (adult) (pediatric): Secondary | ICD-10-CM | POA: Diagnosis not present

## 2023-05-11 DIAGNOSIS — Z7982 Long term (current) use of aspirin: Secondary | ICD-10-CM | POA: Diagnosis not present

## 2023-05-11 DIAGNOSIS — N182 Chronic kidney disease, stage 2 (mild): Secondary | ICD-10-CM | POA: Diagnosis not present

## 2023-05-11 DIAGNOSIS — E1122 Type 2 diabetes mellitus with diabetic chronic kidney disease: Secondary | ICD-10-CM | POA: Diagnosis not present

## 2023-05-11 DIAGNOSIS — D75839 Thrombocytosis, unspecified: Secondary | ICD-10-CM | POA: Diagnosis not present

## 2023-05-11 DIAGNOSIS — R1312 Dysphagia, oropharyngeal phase: Secondary | ICD-10-CM | POA: Diagnosis not present

## 2023-05-11 DIAGNOSIS — Z556 Problems related to health literacy: Secondary | ICD-10-CM | POA: Diagnosis not present

## 2023-05-11 DIAGNOSIS — G20A1 Parkinson's disease without dyskinesia, without mention of fluctuations: Secondary | ICD-10-CM | POA: Diagnosis not present

## 2023-05-11 DIAGNOSIS — Z7902 Long term (current) use of antithrombotics/antiplatelets: Secondary | ICD-10-CM | POA: Diagnosis not present

## 2023-05-11 DIAGNOSIS — Z79899 Other long term (current) drug therapy: Secondary | ICD-10-CM | POA: Diagnosis not present

## 2023-05-11 DIAGNOSIS — I129 Hypertensive chronic kidney disease with stage 1 through stage 4 chronic kidney disease, or unspecified chronic kidney disease: Secondary | ICD-10-CM | POA: Diagnosis not present

## 2023-05-16 ENCOUNTER — Ambulatory Visit: Payer: 59 | Admitting: Internal Medicine

## 2023-05-16 ENCOUNTER — Other Ambulatory Visit: Payer: Self-pay | Admitting: Internal Medicine

## 2023-05-16 ENCOUNTER — Other Ambulatory Visit: Payer: Self-pay

## 2023-05-16 ENCOUNTER — Telehealth: Payer: Self-pay | Admitting: Neurology

## 2023-05-16 ENCOUNTER — Encounter: Payer: Self-pay | Admitting: Internal Medicine

## 2023-05-16 VITALS — BP 138/82 | HR 78 | Temp 97.9°F | Ht 62.0 in | Wt 136.0 lb

## 2023-05-16 DIAGNOSIS — G20C Parkinsonism, unspecified: Secondary | ICD-10-CM | POA: Diagnosis not present

## 2023-05-16 DIAGNOSIS — N182 Chronic kidney disease, stage 2 (mild): Secondary | ICD-10-CM | POA: Diagnosis not present

## 2023-05-16 DIAGNOSIS — W19XXXA Unspecified fall, initial encounter: Secondary | ICD-10-CM

## 2023-05-16 DIAGNOSIS — I129 Hypertensive chronic kidney disease with stage 1 through stage 4 chronic kidney disease, or unspecified chronic kidney disease: Secondary | ICD-10-CM | POA: Diagnosis not present

## 2023-05-16 DIAGNOSIS — E1122 Type 2 diabetes mellitus with diabetic chronic kidney disease: Secondary | ICD-10-CM

## 2023-05-16 DIAGNOSIS — E7800 Pure hypercholesterolemia, unspecified: Secondary | ICD-10-CM

## 2023-05-16 MED FILL — Telmisartan-Hydrochlorothiazide Tab 40-12.5 MG: ORAL | 30 days supply | Qty: 30 | Fill #2 | Status: CN

## 2023-05-16 MED FILL — Atorvastatin Calcium Tab 10 MG (Base Equivalent): ORAL | 30 days supply | Qty: 30 | Fill #1 | Status: CN

## 2023-05-16 NOTE — Patient Instructions (Addendum)
 High blood pressure Please resume telmisartan /hydrochlorothiazide  40/12.5mg  - take 1/2 tablet daily  Hypertension, Adult Hypertension is another name for high blood pressure. High blood pressure forces your heart to work harder to pump blood. This can cause problems over time. There are two numbers in a blood pressure reading. There is a top number (systolic) over a bottom number (diastolic). It is best to have a blood pressure that is below 120/80. What are the causes? The cause of this condition is not known. Some other conditions can lead to high blood pressure. What increases the risk? Some lifestyle factors can make you more likely to develop high blood pressure: Smoking. Not getting enough exercise or physical activity. Being overweight. Having too much fat, sugar, calories, or salt (sodium) in your diet. Drinking too much alcohol. Other risk factors include: Having any of these conditions: Heart disease. Diabetes. High cholesterol. Kidney disease. Obstructive sleep apnea. Having a family history of high blood pressure and high cholesterol. Age. The risk increases with age. Stress. What are the signs or symptoms? High blood pressure may not cause symptoms. Very high blood pressure (hypertensive crisis) may cause: Headache. Fast or uneven heartbeats (palpitations). Shortness of breath. Nosebleed. Vomiting or feeling like you may vomit (nauseous). Changes in how you see. Very bad chest pain. Feeling dizzy. Seizures. How is this treated? This condition is treated by making healthy lifestyle changes, such as: Eating healthy foods. Exercising more. Drinking less alcohol. Your doctor may prescribe medicine if lifestyle changes do not help enough and if: Your top number is above 130. Your bottom number is above 80. Your personal target blood pressure may vary. Follow these instructions at home: Eating and drinking  If told, follow the DASH eating plan. To follow this  plan: Fill one half of your plate at each meal with fruits and vegetables. Fill one fourth of your plate at each meal with whole grains. Whole grains include whole-wheat pasta, brown rice, and whole-grain bread. Eat or drink low-fat dairy products, such as skim milk or low-fat yogurt. Fill one fourth of your plate at each meal with low-fat (lean) proteins. Low-fat proteins include fish, chicken without skin, eggs, beans, and tofu. Avoid fatty meat, cured and processed meat, or chicken with skin. Avoid pre-made or processed food. Limit the amount of salt in your diet to less than 1,500 mg each day. Do not drink alcohol if: Your doctor tells you not to drink. You are pregnant, may be pregnant, or are planning to become pregnant. If you drink alcohol: Limit how much you have to: 0-1 drink a day for women. 0-2 drinks a day for men. Know how much alcohol is in your drink. In the [place] one drink equals one 12 oz bottle of beer (355 mL), one 5 oz glass of wine (148 mL), or one 1 oz glass of hard liquor (44 mL). Lifestyle  Work with your doctor to stay at a healthy weight or to lose weight. Ask your doctor what the best weight is for you. Get at least 30 minutes of exercise that causes your heart to beat faster (aerobic exercise) most days of the week. This may include walking, swimming, or biking. Get at least 30 minutes of exercise that strengthens your muscles (resistance exercise) at least 3 days a week. This may include lifting weights or doing Pilates. Do not smoke or use any products that contain nicotine or tobacco. If you need help quitting, ask your doctor. Check your blood pressure at home as told  by your doctor. Keep all follow-up visits. Medicines Take over-the-counter and prescription medicines only as told by your doctor. Follow directions carefully. Do not skip doses of blood pressure medicine. The medicine does not work as well if you skip doses. Skipping doses also puts you at  risk for problems. Ask your doctor about side effects or reactions to medicines that you should watch for. Contact a doctor if: You think you are having a reaction to the medicine you are taking. You have headaches that keep coming back. You feel dizzy. You have swelling in your ankles. You have trouble with your vision. Get help right away if: You get a very bad headache. You start to feel mixed up (confused). You feel weak or numb. You feel faint. You have very bad pain in your: Chest. Belly (abdomen). You vomit more than once. You have trouble breathing. These symptoms may be an emergency. Get help right away. Call 911. Do not wait to see if the symptoms will go away. Do not drive yourself to the hospital. Summary Hypertension is another name for high blood pressure. High blood pressure forces your heart to work harder to pump blood. For most people, a normal blood pressure is less than 120/80. Making healthy choices can help lower blood pressure. If your blood pressure does not get lower with healthy choices, you may need to take medicine. This information is not intended to replace advice given to you by your health care provider. Make sure you discuss any questions you have with your health care provider. Document Revised: 01/08/2021 Document Reviewed: 01/08/2021 Elsevier Patient Education  2024 [employer].

## 2023-05-16 NOTE — Progress Notes (Signed)
I,Victoria T Deloria Lair, CMA,acting as a Neurosurgeon for [name], MD.,have documented all relevant documentation on the behalf of [name], MD,as directed by  [name], MD while in the presence of [name], MD.  Subjective:  Patient ID: April Larson , female    DOB: 30-Jan-1951 , 73 y.o.   MRN: [id number]  Chief Complaint  Patient presents with   Diabetes   Hypertension    HPI  Patient presents today for a DM and BP & chol check. She is accompanied by her sister today.  Denies headache, chest pain & sob. There are no new concerns today.    She also no longer wants to take Ozempic. She likes Xultophy better. Denies any nausea, diarrhea & vomiting.  Diabetes She presents for her follow-up diabetic visit. She has type 2 diabetes mellitus. Pertinent negatives for hypoglycemia include no headaches. Associated symptoms include blurred vision. Pertinent negatives for diabetes include no polydipsia, no polyphagia and no polyuria. Risk factors for coronary artery disease include diabetes mellitus, dyslipidemia, hypertension, post-menopausal and sedentary lifestyle. She participates in exercise intermittently. Her breakfast blood glucose is taken between 8-9 am. Her breakfast blood glucose range is generally 70-90 mg/dl. An ACE inhibitor/angiotensin II receptor blocker is being taken. Eye exam is current.  Hypertension This is a chronic problem. The current episode started more than 1 year ago. Condition status: FAIR CONTROL. Associated symptoms include blurred vision. Pertinent negatives include no anxiety or headaches. The current treatment provides moderate improvement.     Past Medical History:  Diagnosis Date   Diabetes mellitus without complication (HCC)    Hypertension    Parkinson disease (HCC)    Substance abuse (HCC)      Family History  Problem Relation Age of Onset   Cancer Mother    Diabetes Father    Cancer Brother    Breast cancer Maternal  Aunt    Parkinsonism Neg Hx      Current Outpatient Medications:    ACCU-CHEK FASTCLIX LANCETS MISC, USE TO CHECK BLOOD SUGARS TWICE DAILY, Disp: 100 each, Rfl: 11   Alcohol Swabs (B-D SINGLE USE SWABS REGULAR) PADS, USE AS NEEDED., Disp: 100 each, Rfl: 0   aspirin EC 81 MG tablet, Take 1 tablet (81 mg total) by mouth daily. (Patient taking differently: Take 81 mg by mouth See admin instructions. Take 81 mg by mouth between 8 AM-10 AM), Disp: 90 tablet, Rfl: 1   atorvastatin (LIPITOR) 10 MG tablet, TAKE 1 TABLET EVERY DAY, Disp: 90 tablet, Rfl: 3   Biotin 10 MG CAPS, Take 1 tablet by mouth daily. (Patient taking differently: Take 10 mg by mouth See admin instructions. Take 10 mg by mouth at 10:30 AM), Disp: 90 capsule, Rfl: 1   Blood Glucose Monitoring Suppl (TRUE METRIX AIR GLUCOSE METER) DEVI, USE AS DIRECTED TO CHECK BLOOD SUGARS., Disp: 1 each, Rfl: 0   Blood Glucose Monitoring Suppl (TRUE METRIX METER) w/Device KIT, Use as directed to check blood sugar once daily, Disp: 1 kit, Rfl: 1   calcium carbonate (OSCAL) 1500 (600 Ca) MG TABS tablet, Take 1 tablet (1,500 mg total) by mouth daily with breakfast. (Patient taking differently: Take 1,500 mg by mouth See admin instructions. Take 1,500 mg by mouth at 10:30 AM), Disp: 90 tablet, Rfl: 1   Cholecalciferol (VITAMIN D3) 125 MCG (5000 UT) CAPS, Take 5,000 Units by mouth See admin instructions. Take 5,000 units by mouth at 10:30 AM, Disp: , Rfl:  clopidogrel (PLAVIX) 75 MG tablet, Take 75 mg by mouth See admin instructions. Take 75 mg by mouth at 3:30 PM, Disp: , Rfl:    diclofenac Sodium (VOLTAREN) 1 % GEL, APPLY 2 GRAMS TOPICALLY TO THE AFFECTED AREA FOUR TIMES DAILY (Patient taking differently: Apply 2 g topically See admin instructions. Apply 2 grams to the neck, under the breasts, and to the back 2 times a day), Disp: 100 g, Rfl: 2   fluticasone (FLONASE) 50 MCG/ACT nasal spray, Place 2 sprays into both nostrils daily. (Patient taking  differently: Place 2 sprays into both nostrils in the morning.), Disp: 16 g, Rfl: 2   glucose blood (TRUE METRIX BLOOD GLUCOSE TEST) test strip, Use as instructed, Disp: 100 each, Rfl: 12   Insulin Pen Needle (B-D ULTRAFINE III SHORT PEN) 31G X 8 MM MISC, Use as directed, Disp: 100 each, Rfl: 11   loratadine (CLARITIN) 10 MG tablet, Take 1 tablet by mouth daily (Patient taking differently: Take 10 mg by mouth in the morning. Take 10 mg by mouth at 10:30 AM), Disp: 90 tablet, Rfl: 1   MAGNESIUM PO, Take 1 tablet by mouth at bedtime., Disp: , Rfl:    Multiple Vitamins-Minerals (ONE-A-DAY WOMENS 50+ ADVANTAGE) TABS, Take 1 tablet by mouth daily (Patient taking differently: Take 1 tablet by mouth See admin instructions. Take 1 tablet by mouth at 10:30 AM), Disp: 90 tablet, Rfl: 1   Omega-3 Fatty Acids (FISH OIL) 1000 MG CAPS, Take 1 capsule by mouth daily (Patient taking differently: Take 1,000 mg by mouth See admin instructions. Take 1,000 mg by mouth at 3 PM), Disp: 90 capsule, Rfl: 1   omeprazole (PRILOSEC) 40 MG capsule, Take 1 capsule (40 mg total) by mouth daily before breakfast., Disp: 90 capsule, Rfl: 2   rivastigmine (EXELON) 1.5 MG capsule, TAKE 1 CAPSULE(1.5 MG) BY MOUTH TWICE DAILY, Disp: 60 capsule, Rfl: 5   TRUEplus Lancets 28G MISC, USE AS DIRECTED TO CHECK BLOOD SUGARS., Disp: 100 each, Rfl: 1   Turmeric 500 MG CAPS, Take 1 capsule by mouth 2 (two) times daily. (Patient taking differently: Take 500 mg by mouth See admin instructions. Take 500 mg by mouth at 7 AM and 4:30 PM), Disp: 180 capsule, Rfl: 1   TYLENOL 8 HOUR ARTHRITIS PAIN 650 MG CR tablet, Take 1,300 mg by mouth at bedtime., Disp: , Rfl:    vitamin C (ASCORBIC ACID) 500 MG tablet, Take 1 tablet (500 mg total) by mouth daily. (Patient taking differently: Take 500 mg by mouth See admin instructions. Take 500 mg by mouth at 10:30 AM), Disp: 90 tablet, Rfl: 1   carbidopa-levodopa (SINEMET IR) 25-100 MG tablet, Take 1 tablet by mouth 4  (four) times daily (1 tablet before breakfast, 1 tablet at noon, 1 tablet in the evening, and 1 tablet at bedtime)., Disp: 90 tablet, Rfl: 2   metFORMIN (GLUCOPHAGE) 500 MG tablet, TAKE ONE TABLET BY MOUTH EVERYDAY AT BEDTIME (Patient not taking: Reported on 05/17/2023), Disp: 180 tablet, Rfl: 1   Semaglutide,0.25 or 0.5MG /DOS, (OZEMPIC, 0.25 OR 0.5 MG/DOSE,) 2 MG/1.5ML SOPN, Inject into the skin., Disp: , Rfl:    telmisartan-hydrochlorothiazide (MICARDIS HCT) 40-12.5 MG tablet, Take 1 tablet by mouth every morning., Disp: 90 tablet, Rfl: 1   No Known Allergies   Review of Systems  Constitutional: Negative.   Eyes:  Positive for blurred vision.  Respiratory: Negative.    Cardiovascular: Negative.   Gastrointestinal: Negative.   Endocrine: Negative for polydipsia, polyphagia and polyuria.  Neurological: Negative.  Negative for headaches.  Psychiatric/Behavioral: Negative.       Today's Vitals   05/16/23 1215  BP: 138/82  Pulse: 78  Temp: 97.9 F (36.6 C)  SpO2: 98%  Weight: 136 lb (61.7 kg)  Height: 5\' 2"  (1.575 m)   Body mass index is 24.87 kg/m.  Wt Readings from Last 3 Encounters:  05/17/23 149 lb (67.6 kg)  05/16/23 136 lb (61.7 kg)  03/14/23 149 lb 9.6 oz (67.9 kg)   BP Readings from Last 3 Encounters:  05/17/23 132/80  05/16/23 138/82  03/14/23 132/60       Objective:  Physical Exam Vitals and nursing note reviewed.  Constitutional:      Appearance: Normal appearance.  HENT:     Head: Normocephalic and atraumatic.  Eyes:     Extraocular Movements: Extraocular movements intact.  Cardiovascular:     Rate and Rhythm: Normal rate and regular rhythm.     Heart sounds: Normal heart sounds.  Pulmonary:     Effort: Pulmonary effort is normal.     Breath sounds: Normal breath sounds.  Musculoskeletal:     Cervical back: Normal range of motion.     Comments: Ambulatory with cane  Skin:    General: Skin is warm.  Neurological:     Mental Status: She is alert.      Comments: Shuffling gait  Psychiatric:        Mood and Affect: Mood normal.        Behavior: Behavior normal.         Assessment And Plan:  Diabetes mellitus with stage 2 chronic kidney disease (HCC) [E11.22, N18.2] Assessment & Plan: She does not like being on Ozempic.  She wants to return to [facility]; however, she was hypoglycemic while on this medication.  She is unable to articulate why she doesn't like the Ozempic.  She has been eating.  Denies n/v/d.   She is now on 0.50mg  weekly.  She and sister both confirm that there is no family history of thyroid cancer.  Orders: -     Hemoglobin A1c -     BMP8+eGFR  Benign hypertension with chronic kidney disease, stage II [I12.9, N18.2] Assessment & Plan: Chronic, stable. She is encouraged to check her blood pressures at home. All questions were answered to her satisfaction. 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     Pure hypercholesterolemia [E78.00] Assessment & Plan: Chronic, currently on atorvastatin 10mg  daily.  LDL goal is less than 70. Importance of medication/dietary compliance was discussed with the patient.    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 She will continue with Sinemet and rivastigmine.    Fall, initial encounter   Addendum:  Pt fell while in waiting room. She was walking with her cane and admits she tripped over the cane. There were no loose items on the floor. She was accompanied by her sister. Upon observation of her gait when leaving the exam room, she does not use the cane properly. Her sister agrees. She does not place the cane in front of her prior to taking a step. CMA took vitals after the fall, they were stable. I did examine her - she had abrasion above right eyelid and her glasses scratched her as well. She agrees to apply Bacitracin to affected areas  twice daily. She denies headache, worsening visual disturbance (  has underlying supranuclear palsy) and UE/LE weakness.  Ofc mgr did submit safety portal report. We also discussed having a team come to visit her at her home, since she has difficulty leaving her home ([facility]). She and her sister will think about this. All questions were answered to their satisfaction. They are both aware what symptoms would warrant ER evaluation.    Return in 2 weeks (on 05/30/2023), or [place] - bp check, Friday, for 4 months dm f/u.Marland Kitchen  Patient was given opportunity to ask questions. Patient verbalized understanding of the plan and was able to repeat key elements of the plan. All questions were answered to their satisfaction.    I, [name], MD, have reviewed all documentation for this visit. The documentation on 05/16/23 for the exam, diagnosis, procedures, and orders are all accurate and complete.    IF YOU HAVE BEEN REFERRED TO A SPECIALIST, IT MAY TAKE 1-2 WEEKS TO SCHEDULE/PROCESS THE REFERRAL. IF YOU HAVE NOT HEARD FROM US/SPECIALIST IN TWO WEEKS, PLEASE GIVE [place] A CALL AT [phone] X [phone].   THE PATIENT IS ENCOURAGED TO PRACTICE SOCIAL DISTANCING DUE TO THE COVID-19 PANDEMIC.

## 2023-05-16 NOTE — Telephone Encounter (Signed)
 Appointment details confirmed

## 2023-05-17 ENCOUNTER — Telehealth: Payer: Self-pay

## 2023-05-17 ENCOUNTER — Other Ambulatory Visit (HOSPITAL_COMMUNITY): Payer: Self-pay

## 2023-05-17 ENCOUNTER — Ambulatory Visit (INDEPENDENT_AMBULATORY_CARE_PROVIDER_SITE_OTHER): Payer: 59 | Admitting: Neurology

## 2023-05-17 VITALS — BP 132/80 | HR 82 | Ht 62.0 in | Wt 149.0 lb

## 2023-05-17 DIAGNOSIS — G4733 Obstructive sleep apnea (adult) (pediatric): Secondary | ICD-10-CM | POA: Diagnosis not present

## 2023-05-17 DIAGNOSIS — G231 Progressive supranuclear ophthalmoplegia [Steele-Richardson-Olszewski]: Secondary | ICD-10-CM

## 2023-05-17 DIAGNOSIS — G20C Parkinsonism, unspecified: Secondary | ICD-10-CM

## 2023-05-17 LAB — BMP8+EGFR
BUN/Creatinine Ratio: 23 (ref 12–28)
BUN: 19 mg/dL (ref 8–27)
CO2: 21 mmol/L (ref 20–29)
Calcium: 9.9 mg/dL (ref 8.7–10.3)
Chloride: 105 mmol/L (ref 96–106)
Creatinine, Ser: 0.82 mg/dL (ref 0.57–1.00)
Glucose: 127 mg/dL — ABNORMAL HIGH (ref 70–99)
Potassium: 4.4 mmol/L (ref 3.5–5.2)
Sodium: 140 mmol/L (ref 134–144)
eGFR: 76 mL/min/{1.73_m2} (ref >59)

## 2023-05-17 LAB — HEMOGLOBIN A1C
Est. average glucose Bld gHb Est-mCnc: 154 mg/dL
Hgb A1c MFr Bld: 7 % — ABNORMAL HIGH (ref 4.8–5.6)

## 2023-05-17 MED ORDER — CARBIDOPA-LEVODOPA 25-100 MG PO TABS
1.0000 | ORAL_TABLET | Freq: Four times a day (QID) | ORAL | 2 refills | Status: DC
  Filled 2023-05-17: qty 90, 23d supply, fill #0
  Filled 2023-05-19: qty 120, 30d supply, fill #0
  Filled 2023-06-06 – 2023-06-14 (×2): qty 120, 30d supply, fill #1

## 2023-05-17 NOTE — Progress Notes (Signed)
Patient: April Larson Date of Birth: December 20, 1950  Reason for Visit: Follow up History from: Patient, sister  Primary Neurologist: [facility]   ASSESSMENT AND PLAN 73 y.o. year old female   1.  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  Memory loss 3.  Gait abnormality 4.  Orthostasis, seizure like activity (hospital admission felt more consistent with orthostasis versus seizure, EEG was unrevealing, BP medication was stopped, had resolution) 5.  OSA on CPAP  -Memory is overall stable 25/30, continue Exelon 1.5 mg twice daily.  Decided to continue at current dosing due to feeling memory is improved, potential orthostasis. -Will remain on Sinemet 25/100 mg 4 times daily (started in September 2022),  -Discussed the importance of continuing home PT exercises, fall prevention, safe ambulation -Now living with her brother, more supervision -Order mask refit for CPAP leak, continue nightly usage minimum 4 hours, in March 2025 her CPAP machine will be 72 years old (had HST October 2024, mild OSA but brief oxygen drops to 64% during dream sleep, recommended continue CPAP), consider new CPAP in future -Follow-up about 5 months at our office with Dr. Frances Furbish  HISTORY OF PRESENT ILLNESS: Today 05/17/23 Saw Dr. Frances Furbish June 2024, added Exelon 1.5 mg BID for worsening memory.  She wanted her OSA re-evaluated due to weight loss. HST 01/04/2023 showing mild OSA but brief oxygen drops 64% during dream sleep.  Recommended she resume CPAP, her machine was not 73 years old yet.  She presented to the ER in November 2024 for episodes of staring off, concerning for seizure-like activity.  She was connected to long-term EEG, no seizure activity was noted. No "spells" while wearing EEG.  No medication change.  Felt potentially related to orthostasis.  Her blood pressure medication was discontinued.  Today, MMSE 25/30. Remains on Exelon 1.5 mg BID. [name] thinks it has helped her memory. Using CPAP, but her mask  is leaking. No further seizure like spells reported. BP meds restarted yesterday. While at doctor's office got tangled in her cane, fell onto the carpet, small abrasion above right eye. Remains on Sinemet 25/100 mg 4 times daily. Can tell when she doesn't take it. She lives with her brother.  She has a nurse come out. Brother fixes pill box. She does her own ADLs. Has poor peripheral vision, but can see well straight and close up. Mentions Dr. Dione Booze is retiring, any other options for anyone who specializes. 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  CPAP report shows a greater than 4-hour usage 87%, set pressure 9 cm water.  AHI 3.6, leak 60.6.  Update 09/13/2022: She reports feeling okay, not great.  She feels more forgetful.  Sister endorses that her memory loss has become worse.  She also does not stay very active and does not always follow through with exercises that she was taught in the past by therapists.  She has stopped going to the gym, since they have a break for the summer as far as work is concerned, she indicates that she would be willing to go back to the gym.  She has fallen about 3 times since her last visit.  She has not sustained any major injuries but did scrape her right elbow when she fell out of bed, she lost her balance as she was getting out of bed.  She does not use a walking aid inside the house but uses a 4-point cane when she goes outside the house but does not actually use the cane, tends to  drag it, per sister.  She continues to take Sinemet 4 times a day without side effects.  At the end of this month she is planning to move in with her father into their family home.  [name] will be about 5 minutes away, currently she is about 14 minutes away from her home.  Patient will be sending her home.   Update 04/15/22 SS: Here with her sister. Within the last 3 weeks her insulin medication was reduced. Her Actos was stopped. 1.5 months ago, had yeast infection, back pain, was impacted. She lives  alone, her dog died 2 weeks ago. Has had a few falls. Is done with therapy (OT, PT, cognitive), finished about 4 months ago. Does a good job keeping track of her medications. Takes Sinemet 25/100 mg 4 times daily. Gets pill packs. With vision, is declining, 2 episodes of seeing at eye level, has hard time with looking down low. Has to chew slowly, take her time, takes smaller bites, doesn't cough as much. [name] thinks she would do better if she would follow thru on therapy at home. Last fall was 3 weeks ago, when she bent over forward to pick her dog up. I did her MMSE which was 29/30. BP recheck was 133/78.  HISTORY  Dr. Frances Furbish 10/08/2021: She reports feeling more or less stable.  She did have a fall since her last visit and jammed her eyeglasses onto the nasal bridge and still has a hypopigmented scar from it.  She thought she had lost her keys and put them accidentally in the trash can and was looking in the big trash container outside in the yard for it and toppled over the trash can and hurt herself in the process but no serious injuries thankfully.  She is back in outpatient therapy, is going to start twice weekly with neuro rehab, sister reports that she was not really doing the exercises consistently after she had finished therapy last time and she is going to make sure that patient follows through with recommendations and continues with the recommendations and exercises even during the week and afterwards.  Cognitively, patient feels stable.  She takes her levodopa 4 times a day, generally around 7 AM, 10 AM, 5 or 6 PM and 11 PM.  She goes to bed around 11 PM currently as she is not working but she will pick up work again after school starts.  She is going to try to be in bed earlier than.  She naps typically from 4 to 5 PM daily.  She feels that the levodopa is helping, not able to say specifically how.   REVIEW OF SYSTEMS: Out of a complete 14 system review of symptoms, the patient complains only of the  following symptoms, and all other reviewed systems are negative.  See HPI  ALLERGIES: No Known Allergies  HOME MEDICATIONS: Outpatient Medications Prior to Visit  Medication Sig Dispense Refill   ACCU-CHEK FASTCLIX LANCETS MISC USE TO CHECK BLOOD SUGARS TWICE DAILY 100 each 11   Alcohol Swabs (B-D SINGLE USE SWABS REGULAR) PADS USE AS NEEDED. 100 each 0   aspirin EC 81 MG tablet Take 1 tablet (81 mg total) by mouth daily. (Patient taking differently: Take 81 mg by mouth See admin instructions. Take 81 mg by mouth between 8 AM-10 AM) 90 tablet 1   atorvastatin (LIPITOR) 10 MG tablet TAKE 1 TABLET EVERY DAY 90 tablet 3   Biotin 10 MG CAPS Take 1 tablet by mouth daily. (Patient taking differently:  Take 10 mg by mouth See admin instructions. Take 10 mg by mouth at 10:30 AM) 90 capsule 1   Blood Glucose Monitoring Suppl (TRUE METRIX AIR GLUCOSE METER) DEVI USE AS DIRECTED TO CHECK BLOOD SUGARS. 1 each 0   Blood Glucose Monitoring Suppl (TRUE METRIX METER) w/Device KIT Use as directed to check blood sugar once daily 1 kit 1   calcium carbonate (OSCAL) 1500 (600 Ca) MG TABS tablet Take 1 tablet (1,500 mg total) by mouth daily with breakfast. (Patient taking differently: Take 1,500 mg by mouth See admin instructions. Take 1,500 mg by mouth at 10:30 AM) 90 tablet 1   carbidopa-levodopa (SINEMET IR) 25-100 MG tablet Take 1 tablet by mouth 4 (four) times daily (1 tablet before breakfast, 1 tablet at noon, 1 tablet in the evening, and 1 tablet at bedtime). 90 tablet 2   Cholecalciferol (VITAMIN D3) 125 MCG (5000 UT) CAPS Take 5,000 Units by mouth See admin instructions. Take 5,000 units by mouth at 10:30 AM     clopidogrel (PLAVIX) 75 MG tablet Take 75 mg by mouth See admin instructions. Take 75 mg by mouth at 3:30 PM     diclofenac Sodium (VOLTAREN) 1 % GEL APPLY 2 GRAMS TOPICALLY TO THE AFFECTED AREA FOUR TIMES DAILY (Patient taking differently: Apply 2 g topically See admin instructions. Apply 2 grams to  the neck, under the breasts, and to the back 2 times a day) 100 g 2   fluticasone (FLONASE) 50 MCG/ACT nasal spray Place 2 sprays into both nostrils daily. (Patient taking differently: Place 2 sprays into both nostrils in the morning.) 16 g 2   glucose blood (TRUE METRIX BLOOD GLUCOSE TEST) test strip Use as instructed 100 each 12   Insulin Pen Needle (B-D ULTRAFINE III SHORT PEN) 31G X 8 MM MISC Use as directed 100 each 11   loratadine (CLARITIN) 10 MG tablet Take 1 tablet by mouth daily (Patient taking differently: Take 10 mg by mouth in the morning. Take 10 mg by mouth at 10:30 AM) 90 tablet 1   MAGNESIUM PO Take 1 tablet by mouth at bedtime.     Multiple Vitamins-Minerals (ONE-A-DAY WOMENS 50+ ADVANTAGE) TABS Take 1 tablet by mouth daily (Patient taking differently: Take 1 tablet by mouth See admin instructions. Take 1 tablet by mouth at 10:30 AM) 90 tablet 1   Omega-3 Fatty Acids (FISH OIL) 1000 MG CAPS Take 1 capsule by mouth daily (Patient taking differently: Take 1,000 mg by mouth See admin instructions. Take 1,000 mg by mouth at 3 PM) 90 capsule 1   omeprazole (PRILOSEC) 40 MG capsule Take 1 capsule (40 mg total) by mouth daily before breakfast. 90 capsule 2   rivastigmine (EXELON) 1.5 MG capsule TAKE 1 CAPSULE(1.5 MG) BY MOUTH TWICE DAILY 60 capsule 5   Semaglutide,0.25 or 0.5MG /DOS, (OZEMPIC, 0.25 OR 0.5 MG/DOSE,) 2 MG/1.5ML SOPN Inject into the skin.     telmisartan-hydrochlorothiazide (MICARDIS HCT) 40-12.5 MG tablet Take 1 tablet by mouth every morning. 90 tablet 1   TRUEplus Lancets 28G MISC USE AS DIRECTED TO CHECK BLOOD SUGARS. 100 each 1   Turmeric 500 MG CAPS Take 1 capsule by mouth 2 (two) times daily. (Patient taking differently: Take 500 mg by mouth See admin instructions. Take 500 mg by mouth at 7 AM and 4:30 PM) 180 capsule 1   TYLENOL 8 HOUR ARTHRITIS PAIN 650 MG CR tablet Take 1,300 mg by mouth at bedtime.     vitamin C (ASCORBIC ACID) 500 MG  tablet Take 1 tablet (500 mg  total) by mouth daily. (Patient taking differently: Take 500 mg by mouth See admin instructions. Take 500 mg by mouth at 10:30 AM) 90 tablet 1   metFORMIN (GLUCOPHAGE) 500 MG tablet TAKE ONE TABLET BY MOUTH EVERYDAY AT BEDTIME (Patient not taking: Reported on 05/17/2023) 180 tablet 1   No facility-administered medications prior to visit.    PAST MEDICAL HISTORY: Past Medical History:  Diagnosis Date   Diabetes mellitus without complication (HCC)    Hypertension    Parkinson disease (HCC)    Substance abuse (HCC)     PAST SURGICAL HISTORY: Past Surgical History:  Procedure Laterality Date   EYE SURGERY      FAMILY HISTORY: Family History  Problem Relation Age of Onset   Cancer Mother    Diabetes Father    Cancer Brother    Breast cancer Maternal Aunt    Parkinsonism Neg Hx     SOCIAL HISTORY: Social History   Socioeconomic History   Marital status: Widowed    Spouse name: Not on file   Number of children: Not on file   Years of education: Not on file   Highest education level: Not on file  Occupational History   Occupation: retired  Tobacco Use   Smoking status: Former    Current packs/day: 0.00    Average packs/day: 1 pack/day for 37.0 years (37.0 ttl pk-yrs)    Types: Cigarettes    Start date: 56    Quit date: 04/08/2005    Years since quitting: 18.1   Smokeless tobacco: Never  Vaping Use   Vaping status: Never Used  Substance and Sexual Activity   Alcohol use: No    Alcohol/week: 0.0 standard drinks of alcohol   Drug use: No   Sexual activity: Not Currently  Other Topics Concern   Not on file  Social History Narrative   Not on file   Social Drivers of Health   Financial Resource Strain: Low Risk  (08/04/2022)   Overall Financial Resource Strain (CARDIA)    Difficulty of Paying Living Expenses: Not hard at all  Food Insecurity: No Food Insecurity (03/08/2023)   Hunger Vital Sign    Worried About Running Out of Food in the Last Year: Never true     Ran Out of Food in the Last Year: Never true  Transportation Needs: No Transportation Needs (03/08/2023)   PRAPARE - Administrator, Civil Service (Medical): No    Lack of Transportation (Non-Medical): No  Physical Activity: Inactive (08/04/2022)   Exercise Vital Sign    Days of Exercise per Week: 0 days    Minutes of Exercise per Session: 0 min  Stress: No Stress Concern Present (08/04/2022)   [employer] of Occupational Health - Occupational Stress Questionnaire    Feeling of Stress : Not at all  Social Connections: Not on file  Intimate Partner Violence: Not At Risk (03/08/2023)   Humiliation, Afraid, Rape, and Kick questionnaire    Fear of Current or Ex-Partner: No    Emotionally Abused: No    Physically Abused: No    Sexually Abused: No  0 PHYSICAL EXAM  Vitals:   05/17/23 1540  BP: 132/80  Pulse: 82  Weight: 149 lb (67.6 kg)  Height: 5\' 2"  (1.575 m)    Body mass index is 27.25 kg/m.    05/17/2023    3:42 PM 09/13/2022    2:10 PM 09/13/2022    1:40 PM  MMSE -  Mini Mental State Exam  Orientation to time 5 5 5   Orientation to Place 3 5 5   Registration 3 3 3   Attention/ Calculation 3 3 3   Recall 3 2 2   Language- name 2 objects 2 2 2   Language- repeat 1 1 1   Language- follow 3 step command 3 3 3   Language- read & follow direction 1 1 1   Write a sentence 1 1 1   Copy design 0 0   Total score 25 26    Generalized: Well developed, in no acute distress  Neurological examination  Mentation: Alert oriented to time, place, history is provided by she and her sister, her sister corrects some inaccuracies . Follows all commands. Speech is slowed. Bradyphrenia noted. Masking of the face is seen. Limited eye movement/blink Cranial nerve II-XII: Pupils were equal round reactive to light. EOM are impaired, limitation to upward, downward gaze, slowness and concentration to lateral gaze. Notes to close left eye with movement. Facial sensation and strength were normal.   Head turning and shoulder shrug  were normal and symmetric. Motor: The motor testing reveals 5 over 5 strength of all 4 extremities. Good symmetric motor tone is noted throughout.  Mild to moderate bradykinesia throughout. Sensory: Sensory testing is intact to soft touch on all 4 extremities. No evidence of extinction is noted.  Coordination: Cerebellar testing reveals good finger-nose-finger and heel-to-shin bilaterally. No tremor.  Gait and station: Pushes off to stand,, I gait is slow and cautious, stiff, appears stable, has a walker in the hallway. Some shuffling.  Reflexes: Deep tendon reflexes are symmetric and normal bilaterally.   DIAGNOSTIC DATA (LABS, IMAGING, TESTING) - I reviewed patient records, labs, notes, testing and imaging myself where available.  Lab Results  Component Value Date   WBC 8.6 03/14/2023   HGB 11.4 03/14/2023   HCT 35.7 03/14/2023   MCV 87 03/14/2023   PLT 397 03/14/2023      Component Value Date/Time   NA 140 05/16/2023 1310   K 4.4 05/16/2023 1310   CL 105 05/16/2023 1310   CO2 21 05/16/2023 1310   GLUCOSE 127 (H) 05/16/2023 1310   GLUCOSE 109 (H) 03/06/2023 0600   BUN 19 05/16/2023 1310   CREATININE 0.82 05/16/2023 1310   CALCIUM 9.9 05/16/2023 1310   PROT 7.5 03/02/2023 2057   PROT 7.1 11/24/2022 1015   ALBUMIN 4.0 03/02/2023 2057   ALBUMIN 4.4 11/24/2022 1015   AST 12 (L) 03/02/2023 2057   ALT 8 03/02/2023 2057   ALKPHOS 110 03/02/2023 2057   BILITOT 0.5 03/02/2023 2057   BILITOT 0.2 11/24/2022 1015   GFRNONAA >60 03/06/2023 0600   GFRAA 75 04/15/2020 1607   Lab Results  Component Value Date   CHOL 155 11/24/2022   HDL 71 11/24/2022   LDLCALC 73 11/24/2022   TRIG 54 11/24/2022   CHOLHDL 2.2 11/24/2022   Lab Results  Component Value Date   HGBA1C 7.0 (H) 05/16/2023   Lab Results  Component Value Date   VITAMINB12 772 03/02/2023   Lab Results  Component Value Date   TSH 1.062 03/02/2023    Margie Ege, AGNP-C, DNP  05/17/2023, 3:52 PM [facility] [address] [place], [place] [address] [phone]

## 2023-05-17 NOTE — Patient Instructions (Addendum)
Continue current medications  Recommend exercise, brain stimulating activity  Mask refit for CPAP Follow up in 5 months

## 2023-05-17 NOTE — Telephone Encounter (Signed)
Called to check on patient after fall incident. She states she is doing better. She denies having any specific questions or concerns. Patient denies any headache, dizziness & blurred vision. Patient aware to give the office a call if anything changes.

## 2023-05-18 ENCOUNTER — Telehealth: Payer: Self-pay | Admitting: Anesthesiology

## 2023-05-18 NOTE — Telephone Encounter (Signed)
New Order sent to [facility] for mask refit.  Confirmation received.

## 2023-05-19 ENCOUNTER — Other Ambulatory Visit: Payer: Self-pay

## 2023-05-19 MED FILL — Atorvastatin Calcium Tab 10 MG (Base Equivalent): ORAL | 30 days supply | Qty: 30 | Fill #1 | Status: AC

## 2023-05-19 MED FILL — Telmisartan-Hydrochlorothiazide Tab 40-12.5 MG: ORAL | 30 days supply | Qty: 30 | Fill #2 | Status: AC

## 2023-05-22 NOTE — Assessment & Plan Note (Signed)
Chronic, currently on atorvastatin 10mg  daily.  LDL goal is less than 70. Importance of medication/dietary compliance was discussed with the patient.

## 2023-05-22 NOTE — Assessment & Plan Note (Signed)
Chronic, stable. She is encouraged to check her blood pressures at home. All questions were answered to her satisfaction. 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

## 2023-05-22 NOTE — Assessment & Plan Note (Signed)
She does not like being on Ozempic.  She wants to return to [facility]; however, she was hypoglycemic while on this medication.  She is unable to articulate why she doesn't like the Ozempic.  She has been eating.  Denies n/v/d.   She is now on 0.50mg  weekly.  She and sister both confirm that there is no family history of thyroid cancer.

## 2023-05-22 NOTE — Assessment & Plan Note (Signed)
 Chronic, Neurology input is appreciated. She will continue with Sinemet and rivastigmine.

## 2023-05-23 ENCOUNTER — Other Ambulatory Visit: Payer: Self-pay

## 2023-05-24 DIAGNOSIS — G4733 Obstructive sleep apnea (adult) (pediatric): Secondary | ICD-10-CM | POA: Diagnosis not present

## 2023-05-27 DIAGNOSIS — Z556 Problems related to health literacy: Secondary | ICD-10-CM | POA: Diagnosis not present

## 2023-05-27 DIAGNOSIS — G4733 Obstructive sleep apnea (adult) (pediatric): Secondary | ICD-10-CM | POA: Diagnosis not present

## 2023-05-27 DIAGNOSIS — E1122 Type 2 diabetes mellitus with diabetic chronic kidney disease: Secondary | ICD-10-CM | POA: Diagnosis not present

## 2023-05-27 DIAGNOSIS — R1312 Dysphagia, oropharyngeal phase: Secondary | ICD-10-CM | POA: Diagnosis not present

## 2023-05-27 DIAGNOSIS — Z794 Long term (current) use of insulin: Secondary | ICD-10-CM | POA: Diagnosis not present

## 2023-05-27 DIAGNOSIS — I129 Hypertensive chronic kidney disease with stage 1 through stage 4 chronic kidney disease, or unspecified chronic kidney disease: Secondary | ICD-10-CM | POA: Diagnosis not present

## 2023-05-27 DIAGNOSIS — Z79899 Other long term (current) drug therapy: Secondary | ICD-10-CM | POA: Diagnosis not present

## 2023-05-27 DIAGNOSIS — Z87891 Personal history of nicotine dependence: Secondary | ICD-10-CM | POA: Diagnosis not present

## 2023-05-27 DIAGNOSIS — Z9181 History of falling: Secondary | ICD-10-CM | POA: Diagnosis not present

## 2023-05-27 DIAGNOSIS — D75839 Thrombocytosis, unspecified: Secondary | ICD-10-CM | POA: Diagnosis not present

## 2023-05-27 DIAGNOSIS — G231 Progressive supranuclear ophthalmoplegia [Steele-Richardson-Olszewski]: Secondary | ICD-10-CM | POA: Diagnosis not present

## 2023-05-27 DIAGNOSIS — E1151 Type 2 diabetes mellitus with diabetic peripheral angiopathy without gangrene: Secondary | ICD-10-CM | POA: Diagnosis not present

## 2023-05-27 DIAGNOSIS — G20A1 Parkinson's disease without dyskinesia, without mention of fluctuations: Secondary | ICD-10-CM | POA: Diagnosis not present

## 2023-05-27 DIAGNOSIS — E7800 Pure hypercholesterolemia, unspecified: Secondary | ICD-10-CM | POA: Diagnosis not present

## 2023-05-27 DIAGNOSIS — N182 Chronic kidney disease, stage 2 (mild): Secondary | ICD-10-CM | POA: Diagnosis not present

## 2023-05-27 DIAGNOSIS — Z7982 Long term (current) use of aspirin: Secondary | ICD-10-CM | POA: Diagnosis not present

## 2023-05-27 DIAGNOSIS — Z7985 Long-term (current) use of injectable non-insulin antidiabetic drugs: Secondary | ICD-10-CM | POA: Diagnosis not present

## 2023-05-27 DIAGNOSIS — Z7902 Long term (current) use of antithrombotics/antiplatelets: Secondary | ICD-10-CM | POA: Diagnosis not present

## 2023-06-02 ENCOUNTER — Other Ambulatory Visit: Payer: Self-pay

## 2023-06-02 ENCOUNTER — Other Ambulatory Visit (HOSPITAL_COMMUNITY): Payer: Self-pay

## 2023-06-02 ENCOUNTER — Other Ambulatory Visit: Payer: Self-pay | Admitting: Internal Medicine

## 2023-06-02 MED ORDER — OZEMPIC (0.25 OR 0.5 MG/DOSE) 2 MG/3ML ~~LOC~~ SOPN
0.2500 mg | PEN_INJECTOR | SUBCUTANEOUS | 0 refills | Status: DC
  Filled 2023-06-02: qty 3, 56d supply, fill #0

## 2023-06-02 NOTE — Telephone Encounter (Signed)
 Copied from CRM [id number]. Topic: Clinical - Medication Refill >> Jun 02, 2023  9:46 AM Gery Pray wrote: Most Recent Primary Care Visit:  Provider: Dorothyann Peng  Department: [facility]  Visit Type: OFFICE VISIT  Date: 05/16/2023  Medication: Semaglutide,0.25 or 0.5MG /DOS, (OZEMPIC, 0.25 OR 0.5 MG/DOSE,) 2 MG/1.5ML SOPN  Has the patient contacted their pharmacy? No (Agent: If no, request that the patient contact the pharmacy for the refill. If patient does not wish to contact the pharmacy document the reason why and proceed with request.) (Agent: If yes, when and what did the pharmacy advise?) Medicine was a sample medication would need to contact provider to have it prescribed to her  Is this the correct pharmacy for this prescription? Yes If no, delete pharmacy and type the correct one.  This is the patient's preferred pharmacy:  [facility] [address] [place] [address] Phone: [phone] Fax: [phone]  Has the prescription been filled recently? No  Is the patient out of the medication? Yes  Has the patient been seen for an appointment in the last year OR does the patient have an upcoming appointment? Yes  Can we respond through MyChart? No  Agent: Please be advised that Rx refills may take up to 3 business days. We ask that you follow-up with your pharmacy.

## 2023-06-03 ENCOUNTER — Other Ambulatory Visit: Payer: Self-pay

## 2023-06-03 ENCOUNTER — Other Ambulatory Visit (HOSPITAL_COMMUNITY): Payer: Self-pay

## 2023-06-03 ENCOUNTER — Ambulatory Visit: Payer: 59

## 2023-06-03 VITALS — BP 144/80 | HR 89 | Temp 98.1°F | Ht 62.0 in | Wt 149.0 lb

## 2023-06-03 DIAGNOSIS — I129 Hypertensive chronic kidney disease with stage 1 through stage 4 chronic kidney disease, or unspecified chronic kidney disease: Secondary | ICD-10-CM

## 2023-06-03 MED ORDER — OZEMPIC (0.25 OR 0.5 MG/DOSE) 2 MG/3ML ~~LOC~~ SOPN: 0.2500 mg | PEN_INJECTOR | SUBCUTANEOUS | 0 refills | Status: DC

## 2023-06-03 NOTE — Progress Notes (Unsigned)
 Patient presents today for bpc. She is accompanied by her sister today. She currently takes Telmisartan -hydrochlorothiazide 40-12.5 1/2 a tablet in the mornings at 10:30AM. She has also brought in 2 bp cuffs she uses at home. A older machine & a newer machine she just purchaed. Old machine read: 176/83. Newer machine read: 158/89. Bp taken again in 10 minutes: 144/80.

## 2023-06-06 ENCOUNTER — Other Ambulatory Visit: Payer: Self-pay

## 2023-06-06 MED FILL — Atorvastatin Calcium Tab 10 MG (Base Equivalent): ORAL | 30 days supply | Qty: 30 | Fill #2 | Status: CN

## 2023-06-06 MED FILL — Telmisartan-Hydrochlorothiazide Tab 40-12.5 MG: ORAL | 30 days supply | Qty: 30 | Fill #3 | Status: CN

## 2023-06-14 ENCOUNTER — Other Ambulatory Visit: Payer: Self-pay

## 2023-06-14 ENCOUNTER — Other Ambulatory Visit (HOSPITAL_COMMUNITY): Payer: Self-pay

## 2023-06-14 MED FILL — Atorvastatin Calcium Tab 10 MG (Base Equivalent): ORAL | 30 days supply | Qty: 30 | Fill #2 | Status: AC

## 2023-06-14 MED FILL — Telmisartan-Hydrochlorothiazide Tab 40-12.5 MG: ORAL | 30 days supply | Qty: 30 | Fill #3 | Status: AC

## 2023-06-15 ENCOUNTER — Other Ambulatory Visit: Payer: Self-pay

## 2023-06-17 ENCOUNTER — Telehealth: Payer: Self-pay | Admitting: Neurology

## 2023-06-17 ENCOUNTER — Other Ambulatory Visit: Payer: Self-pay

## 2023-06-17 ENCOUNTER — Ambulatory Visit: Payer: 59

## 2023-06-17 VITALS — BP 120/64 | HR 97 | Temp 98.5°F | Ht 62.0 in | Wt 149.0 lb

## 2023-06-17 DIAGNOSIS — I129 Hypertensive chronic kidney disease with stage 1 through stage 4 chronic kidney disease, or unspecified chronic kidney disease: Secondary | ICD-10-CM

## 2023-06-17 DIAGNOSIS — G231 Progressive supranuclear ophthalmoplegia [Steele-Richardson-Olszewski]: Secondary | ICD-10-CM

## 2023-06-17 NOTE — Telephone Encounter (Signed)
 Patient came into office requesting a referral to neuro PT. Stated she went there before and was very beneficial. Has someone come to the house but does not feel very structured. She asked PCP for this, but they said would be better to come from Neurologist.

## 2023-06-17 NOTE — Progress Notes (Signed)
 Patient presents today for a BP check - patient currently taking 1/2 of telmisartan-hydrochlorothiazide 40-12.5mg  AM. BP Readings from Last 3 Encounters:  06/17/23 (!) 140/60  06/03/23 (!) 144/80  05/17/23 132/80   Per Provider- Continue with current medications- patient advised to stay with her low sodium diet.

## 2023-06-20 ENCOUNTER — Telehealth: Payer: Self-pay | Admitting: Neurology

## 2023-06-20 NOTE — Telephone Encounter (Signed)
 Referral for physical therapy sent through [facility] to [facility]. Phone: [phone]

## 2023-06-20 NOTE — Telephone Encounter (Signed)
 Order to PT Neuro rehab  Orders Placed This Encounter  Procedures   Referral to Neuro Rehab   Ambulatory referral to Physical Therapy

## 2023-06-23 ENCOUNTER — Other Ambulatory Visit: Payer: Self-pay

## 2023-06-23 ENCOUNTER — Other Ambulatory Visit: Payer: Self-pay | Admitting: Internal Medicine

## 2023-06-23 MED FILL — Telmisartan-Hydrochlorothiazide Tab 40-12.5 MG: ORAL | 30 days supply | Qty: 30 | Fill #4 | Status: CN

## 2023-06-23 MED FILL — Atorvastatin Calcium Tab 10 MG (Base Equivalent): ORAL | 30 days supply | Qty: 30 | Fill #3 | Status: CN

## 2023-06-24 ENCOUNTER — Other Ambulatory Visit: Payer: Self-pay

## 2023-06-24 ENCOUNTER — Other Ambulatory Visit (HOSPITAL_COMMUNITY): Payer: Self-pay

## 2023-06-24 MED ORDER — CARBIDOPA-LEVODOPA 25-100 MG PO TABS
1.0000 | ORAL_TABLET | Freq: Four times a day (QID) | ORAL | 2 refills | Status: DC
  Filled 2023-07-07: qty 120, 30d supply, fill #0
  Filled 2023-07-25 – 2023-08-02 (×2): qty 120, 30d supply, fill #1

## 2023-06-30 ENCOUNTER — Encounter: Payer: Medicare PPO | Admitting: Psychology

## 2023-07-01 ENCOUNTER — Encounter: Payer: Self-pay | Admitting: Physical Therapy

## 2023-07-01 ENCOUNTER — Telehealth: Payer: Self-pay | Admitting: Physical Therapy

## 2023-07-01 ENCOUNTER — Ambulatory Visit: Attending: Neurology | Admitting: Physical Therapy

## 2023-07-01 ENCOUNTER — Other Ambulatory Visit: Payer: Self-pay

## 2023-07-01 VITALS — BP 157/78 | HR 89

## 2023-07-01 DIAGNOSIS — R293 Abnormal posture: Secondary | ICD-10-CM | POA: Insufficient documentation

## 2023-07-01 DIAGNOSIS — G231 Progressive supranuclear ophthalmoplegia [Steele-Richardson-Olszewski]: Secondary | ICD-10-CM | POA: Diagnosis not present

## 2023-07-01 DIAGNOSIS — M6281 Muscle weakness (generalized): Secondary | ICD-10-CM | POA: Insufficient documentation

## 2023-07-01 DIAGNOSIS — G20C Parkinsonism, unspecified: Secondary | ICD-10-CM

## 2023-07-01 DIAGNOSIS — R471 Dysarthria and anarthria: Secondary | ICD-10-CM

## 2023-07-01 DIAGNOSIS — R41842 Visuospatial deficit: Secondary | ICD-10-CM

## 2023-07-01 DIAGNOSIS — R41840 Attention and concentration deficit: Secondary | ICD-10-CM

## 2023-07-01 DIAGNOSIS — R41841 Cognitive communication deficit: Secondary | ICD-10-CM

## 2023-07-01 DIAGNOSIS — R2681 Unsteadiness on feet: Secondary | ICD-10-CM | POA: Insufficient documentation

## 2023-07-01 DIAGNOSIS — R1310 Dysphagia, unspecified: Secondary | ICD-10-CM

## 2023-07-01 DIAGNOSIS — R2689 Other abnormalities of gait and mobility: Secondary | ICD-10-CM | POA: Diagnosis not present

## 2023-07-01 NOTE — Telephone Encounter (Signed)
 Good morning,  April Larson was evaluated by physical therapy on 07/01/2023.  The patient would benefit from OT and SLP evaluation for changes in speech/swallowing and overall functional with ADLs.    If you agree, please place an order in [facility] workque in [facility] or fax the order to [phone].  Thank you, Maryruth Eve, PT, DPT   [facility] [address] [place], [place]  [address] Phone:  [phone] Fax:  [phone]

## 2023-07-01 NOTE — Therapy (Signed)
 OUTPATIENT PHYSICAL THERAPY NEURO EVALUATION   Patient Name: April Larson MRN: [id number] DOB:Aug 30, 1950, 73 y.o., female Today's Date: 07/01/2023   PCP: April Peng, MD REFERRING PROVIDER: Glean Salvo, NP  END OF SESSION:  PT End of Session - 07/01/23 0934     Visit Number 1    Number of Visits 9    Date for PT Re-Evaluation 08/12/23    Authorization Type [employer] Dual Complete    PT Start Time 7829    PT Stop Time 1013    PT Time Calculation (min) 45 min    Equipment Utilized During Treatment Gait belt    Activity Tolerance Patient tolerated treatment well    Behavior During Therapy WFL for tasks assessed/performed             Past Medical History:  Diagnosis Date   Diabetes mellitus without complication (HCC)    Hypertension    Parkinson disease (HCC)    Substance abuse (HCC)    Past Surgical History:  Procedure Laterality Date   EYE SURGERY     Patient Active Problem List   Diagnosis Date Noted   OSA on CPAP 05/17/2023   Anemia 04/03/2023   Supranuclear palsy (HCC) 04/03/2023   Atypical syncope 03/04/2023   Seizure disorder (HCC) 03/04/2023   Seizure-like activity (HCC) 03/02/2023   Thrombocytosis 12/06/2022   Gait abnormality 12/06/2022   Encounter for annual physical exam 11/24/2022   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   Decreased dorsalis pedis pulse 11/17/2020   Tachycardia 08/11/2020   Cough 08/11/2020   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   Pure hypercholesterolemia 01/23/2020   Vitamin D deficiency 10/23/2019   Right hip pain 05/15/2018   Estrogen deficiency 05/15/2018   Class 1 obesity due to excess calories with serious comorbidity and body mass index (BMI) of 31.0 to 31.9 in adult 05/15/2018   Diabetes mellitus with stage 2 chronic kidney disease (HCC) 01/11/2018   Benign hypertension with chronic kidney disease, stage II 01/11/2018    ONSET DATE: 06/20/2023 (referral date)   REFERRING DIAG: G23.1  (ICD-10-CM) - Supranuclear palsy (HCC)  THERAPY DIAG:  Unsteadiness on feet - Plan: PT plan of care cert/re-cert  Other abnormalities of gait and mobility - Plan: PT plan of care cert/re-cert  Muscle weakness (generalized) - Plan: PT plan of care cert/re-cert  Rationale for Evaluation and Treatment: Rehabilitation  SUBJECTIVE:                                                                                                                                                                                             SUBJECTIVE STATEMENT: Majority of  subjective provided by patient with sister correcting intermittently dates/details as patient is a poor historian.   Patient arrives to clinic with 2WW. Patient reports that since she was last here she started using a cane 6-9 months ago and then started using a 2WW approximately 2 months ago. Patient reports 3 falls over the last 6 months. One due to backwards fall (no AD), one due to freezing (without AD), and one at the doctor's office when she had her cane with her. Per sister, patient never learned to use the cane properly. Patient reports she is no longer going to gym; she reports that she is doing bike petals when sitting and doing exercises for her arms with pulleys. Otherwise not exercising. Patient was previously doing all 3 disciplines at home but ended ~ 3 weeks ago. Reports considerable speech changes and some concerns about swallowing and would like to continue OT and SLP in outpatient setting as well. Patient moved in with brother ~ 9 months ago per sister.   Pt accompanied by: self and sister April Larson  PERTINENT HISTORY: PSP  PAIN:  Are you having pain? No  PRECAUTIONS: None  RED FLAGS: None   WEIGHT BEARING RESTRICTIONS: No  FALLS: Has patient fallen in last 6 months? Yes. Number of falls 3 - see above for details   LIVING ENVIRONMENT: Lives with: lives with their family - brother Lives in: House/apartment Stairs: Yes:  External: 3 stairs steps; bilateral but cannot reach both - previously had a ramp but not at this time Has following equipment at home: Single point cane and Walker - 2 wheeled  PLOF:  Patient was last living independently about a year ago (9 months) and also stopped driving about 9 months   PATIENT GOALS: "To learn how to function."   OBJECTIVE:  Note: Objective measures were completed at Evaluation unless otherwise noted.  COGNITION: Overall cognitive status: Impaired - patient required frequent prompting from sister for correction on dates              SENSATION: Not tested- pt denies    COORDINATION: Heel to shin test: hypokinesia with some overshoots noted bilaterally  Toe tapping - hypokinetic and out not in unison    POSTURE: rounded shoulders, forward head, increased thoracic kyphosis, and posterior pelvic tilt     LOWER EXTREMITY MMT:  Tested in seated position   MMT Right Eval Left Eval  Hip flexion 4 4  Hip extension      Hip abduction 4 4  Hip adduction 4 4  Hip internal rotation      Hip external rotation      Knee flexion 4- 4-  Knee extension 4- 4-  Ankle dorsiflexion 4 4+  Ankle plantarflexion      Ankle inversion      Ankle eversion      (Blank rows = not tested)  More diffculty sequencing movements    BED MOBILITY:  Patient reports that they took mattress of bed to make easier to get in, reports independece still but to be assessed in future PT sessions    TRANSFERS: Assistive device utilized: 2WW Sit to stand: CGA with minA to stabilize chair and cues on proper hand placement as frequently grabs for walker to pull to stand Stand to sit: poor eccentric control for final part of descent    GAIT: Gait pattern: step through pattern, decreased stride length more notably on R side  Distance walked: Various clinic distances  Assistive device utilized: 2WW (  intially no tennis balls and then added which helped improve turns) Level of assistance:  CGA-SBA with mod verbal cues given visual freezing most notably vertically upward Comments: When PT went to get patient in waiting room, lateral instability noted with turn requiring CGA from caregiver  FUNCTIONAL TESTS:    [facility] PT Assessment - 07/01/23 0001       Standardized Balance Assessment   Standardized Balance Assessment Five Times Sit to Stand;10 meter walk test;Timed Up and Go Test    Five times sit to stand comments  56.9   seconds with strong retropulsion, bilateral UE use, and requires cues on hand placement (CGA with minA to stabilize chair)   10 Meter Walk 1.19   ft/sec with 2WW (CGA with mod verbal cues due to visual freezing)     Timed Up and Go Test   Normal TUG (seconds) 54   seconds with 2WW (with mod verbal cues due to visual freezing and CGA, cues for hand placement with safety with transfers)           Vitals:   07/01/23 0955  BP: (!) 157/78  Pulse: 89  Elevated but WFL for therapy                                                                                                                TREATMENT :    Self Care: Discussed with patient addition of OT and SLP with PT strongly recommend and patient and caregiver in agreement, also added tennis balls to base of 2WW to improve safety particularly with turns, answered patient questions in regards to "why do I walk so slow" with use of brake analogy; educated on caregiver attending all future sessions for carryover in home given patient's cognitive changes, reviewed BP safety briefly  PATIENT EDUCATION: Education details: POC, goal collaboration, examination finding  Person educated: Patient and Caregiver sister Education method: Explanation Education comprehension: verbalized understanding and needs further education  HOME EXERCISE PROGRAM:  **Exercise Program from prior bout of PT noted below - recommend updating to meet current functions before renewing this bout of PT Access Code: [email or web address], Standing PWR  moves.   GOALS: Goals reviewed with patient? Yes  LONG TERM GOALS: Target date: 08/12/2023 (STG = LTG due to POC length)  Patient and caregiver will demonstrate independence with final HEP in order to maintain current gains and continue to progress after physical therapy discharge.   Baseline: To be provided  Goal status: INITIAL  2.  Patient will improve their 5x Sit to Stand score to less than 49 seconds with minimal necessary UE use to demonstrate a decreased risk for falls and improved LE strength.   Baseline: 56.9 seconds with bilateral UE use/retropulsive (CGA) Goal status: INITIAL  3.  Patient will improve gait speed to 1.52 ft/sec with LRAD to indicate progress towards reduction in falls risk and meet MCID for meaningful change in gait speed.   Baseline: 1.19 ft/sec  Goal status: INITIAL  4.  Patient will improve TUG score to  45 seconds to indicate clinically significant progress towards a decreased risk of falls and improved mobility.  Baseline: 54 seconds with 2WW + CGA and mod verbal cues  Goal status: INITIAL  5. Family members will verbalize long term plan for patient to have safe entry in home as currently requires use of stairs with single handrail.  Baseline: Stairs with single rail - discussed ramped entry etc in future Goal status: INITIAL    ASSESSMENT:  CLINICAL IMPRESSION: Patient is a 73 y.o. female who was seen today for physical therapy evaluation and treatment for impairments related to PSP. Patient known to this clinic for prior physical therapy but has not been seen in over a year. Patient presents with significant functional decline since last seen, including progression from no AD to cane to now use of 2WW. Patient with 3 falls in last 6 months and is a very high falls risk as indicated by 5xSTS, TUG, and gait speed. Patient with other functional declines including > 40 second increase on TUG since last here, decline in over 2.0 ft/sec on gait speed, and  nearly 30 second increase on 5xSTS. Patient no longer driving and moved in with brother in the last 9 months. Patient also with notably progression of vertical eye freezing, resulting in need for moderate cueing throughout session for navigation around clinic given visual limitations. Speech also notably changed and given overall decline PT recommends OT and SLP referral as well. Patient will strongly benefit from PT to improve safety in home environment, caregiver training/education, and maximize function.  OBJECTIVE IMPAIRMENTS: Abnormal gait, decreased activity tolerance, decreased balance, decreased cognition, decreased knowledge of use of DME, difficulty walking, decreased strength, decreased safety awareness, and impaired vision/preception.   ACTIVITY LIMITATIONS: carrying, lifting, sitting, standing, squatting, transfers, reach over head, and locomotion level  PARTICIPATION LIMITATIONS: meal prep, cleaning, laundry, driving, community activity, and yard work  PERSONAL FACTORS: Age, Time since onset of injury/illness/exacerbation, and 1-2 comorbidities: see above  are also affecting patient's functional outcome.   REHAB POTENTIAL: Fair given progression of PSP  CLINICAL DECISION MAKING: Evolving/moderate complexity  EVALUATION COMPLEXITY: Moderate  PLAN:  PT FREQUENCY: 2x/week  PT DURATION: 4 weeks  PLANNED INTERVENTIONS: 97164- PT Re-evaluation, 97110-Therapeutic exercises, 97530- Therapeutic activity, 97112- Neuromuscular re-education, 97535- Self Care, 13086- Manual therapy, and 97116- Gait training  PLAN FOR NEXT SESSION: update HEP to make appropriate for current function, work on safety with 2WW with transfers, assess mat mobility, anterior weight shift with transfers, caregiver training for safety with visual freezing, long term plan for patient getting into and out of home (currently stairs)  Carmelia Bake, PT, DPT 07/01/2023, 11:05 AM

## 2023-07-04 ENCOUNTER — Encounter: Payer: Self-pay | Admitting: Physical Therapy

## 2023-07-04 ENCOUNTER — Ambulatory Visit: Admitting: Physical Therapy

## 2023-07-04 VITALS — BP 151/74 | HR 86

## 2023-07-04 DIAGNOSIS — R293 Abnormal posture: Secondary | ICD-10-CM | POA: Diagnosis not present

## 2023-07-04 DIAGNOSIS — G231 Progressive supranuclear ophthalmoplegia [Steele-Richardson-Olszewski]: Secondary | ICD-10-CM | POA: Diagnosis not present

## 2023-07-04 DIAGNOSIS — R2681 Unsteadiness on feet: Secondary | ICD-10-CM

## 2023-07-04 DIAGNOSIS — R2689 Other abnormalities of gait and mobility: Secondary | ICD-10-CM | POA: Diagnosis not present

## 2023-07-04 DIAGNOSIS — M6281 Muscle weakness (generalized): Secondary | ICD-10-CM

## 2023-07-04 NOTE — Patient Instructions (Signed)
 Sit to Stand Transfers:  Scoot out to the edge of the chair Place your feet flat on the floor, shoulder width apart.  Make sure your feet are tucked just under your knees (TUCK FEET BACK) Lean forward (BIG nose over toes), and stand up tall with your best posture.  Use your hands to help stand  Once you are standing, make sure you are looking ahead and standing tall.  To sit down:  Back up until you feel the chair behind your legs. Bend at you hips, reaching  Back for you chair, if needed, then slowly squat to sit down on your chair. Trying to think about keeping your feet flat on the floor!

## 2023-07-04 NOTE — Therapy (Signed)
 OUTPATIENT PHYSICAL THERAPY NEURO TREATMENT   Patient Name: April Larson MRN: [id number] DOB:19-May-1950, 73 y.o., female Today's Date: 07/04/2023   PCP: April Peng, MD REFERRING PROVIDER: Glean Salvo, NP  END OF SESSION:  PT End of Session - 07/04/23 0803     Visit Number 2    Number of Visits 9    Date for PT Re-Evaluation 08/12/23    Authorization Type [employer] Dual Complete    PT Start Time 0801    PT Stop Time 0843    PT Time Calculation (min) 42 min    Equipment Utilized During Treatment Gait belt    Activity Tolerance Patient tolerated treatment well    Behavior During Therapy WFL for tasks assessed/performed             Past Medical History:  Diagnosis Date   Diabetes mellitus without complication (HCC)    Hypertension    Parkinson disease (HCC)    Substance abuse (HCC)    Past Surgical History:  Procedure Laterality Date   EYE SURGERY     Patient Active Problem List   Diagnosis Date Noted   OSA on CPAP 05/17/2023   Anemia 04/03/2023   Supranuclear palsy (HCC) 04/03/2023   Atypical syncope 03/04/2023   Seizure disorder (HCC) 03/04/2023   Seizure-like activity (HCC) 03/02/2023   Thrombocytosis 12/06/2022   Gait abnormality 12/06/2022   Encounter for annual physical exam 11/24/2022   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   Decreased dorsalis pedis pulse 11/17/2020   Tachycardia 08/11/2020   Cough 08/11/2020   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   Pure hypercholesterolemia 01/23/2020   Vitamin D deficiency 10/23/2019   Right hip pain 05/15/2018   Estrogen deficiency 05/15/2018   Class 1 obesity due to excess calories with serious comorbidity and body mass index (BMI) of 31.0 to 31.9 in adult 05/15/2018   Diabetes mellitus with stage 2 chronic kidney disease (HCC) 01/11/2018   Benign hypertension with chronic kidney disease, stage II 01/11/2018    ONSET DATE: 06/20/2023 (referral date)   REFERRING DIAG: G23.1  (ICD-10-CM) - Supranuclear palsy (HCC)  THERAPY DIAG:  Abnormal posture  Muscle weakness (generalized)  Other abnormalities of gait and mobility  Unsteadiness on feet  Rationale for Evaluation and Treatment: Rehabilitation  SUBJECTIVE:                                                                                                                                                                                             SUBJECTIVE STATEMENT: Reports home health was not very structured. Got the tennis balls on her RW and feels like they are falling  off of her walker. Reports it does feel easier to push. Wants to work on walking and not stumbling.   Pt accompanied by: self and sister [name]  PERTINENT HISTORY: PSP  PAIN:  Are you having pain? No  PRECAUTIONS: None  RED FLAGS: None   WEIGHT BEARING RESTRICTIONS: No  FALLS: Has patient fallen in last 6 months? Yes. Number of falls 3 - see above for details   LIVING ENVIRONMENT: Lives with: lives with their family - brother Lives in: House/apartment Stairs: Yes: External: 3 stairs steps; bilateral but cannot reach both - previously had a ramp but not at this time Has following equipment at home: Single point cane and Walker - 2 wheeled  PLOF:  Patient was last living independently about a year ago (9 months) and also stopped driving about 9 months   PATIENT GOALS: "To learn how to function."   OBJECTIVE:  Note: Objective measures were completed at Evaluation unless otherwise noted.  COGNITION: Overall cognitive status: Impaired - patient required frequent prompting from sister for correction on dates              SENSATION: Not tested- pt denies    COORDINATION: Heel to shin test: hypokinesia with some overshoots noted bilaterally  Toe tapping - hypokinetic and out not in unison    POSTURE: rounded shoulders, forward head, increased thoracic kyphosis, and posterior pelvic tilt     LOWER EXTREMITY MMT:  Tested in  seated position   MMT Right Eval Left Eval  Hip flexion 4 4  Hip extension      Hip abduction 4 4  Hip adduction 4 4  Hip internal rotation      Hip external rotation      Knee flexion 4- 4-  Knee extension 4- 4-  Ankle dorsiflexion 4 4+  Ankle plantarflexion      Ankle inversion      Ankle eversion      (Blank rows = not tested)  More diffculty sequencing movements    BED MOBILITY:  Patient reports that they took mattress of bed to make easier to get in, reports independece still but to be assessed in future PT sessions    TRANSFERS: Assistive device utilized: 2WW Sit to stand: CGA with minA to stabilize chair and cues on proper hand placement as frequently grabs for walker to pull to stand Stand to sit: poor eccentric control for final part of descent    GAIT: Gait pattern: step through pattern, decreased stride length more notably on R side  Distance walked: Various clinic distances  Assistive device utilized: 2WW (intially no tennis balls and then added which helped improve turns) Level of assistance: CGA-SBA with mod verbal cues given visual freezing most notably vertically upward Comments: When PT went to get patient in waiting room, lateral instability noted with turn requiring CGA from caregiver                                                                TREATMENT :     GAIT: Gait pattern: step through pattern, decreased stride length more notably on R side  Distance walked: Various clinic distances  Assistive device utilized: 115' x 1 with RW, plus into and out of clinic, 100' x 1 with  hurry cane Level of assistance: CGA/min A with hurrycane, SBA/CGA  Comments: Pt ambulating into clinic with RW, needing cues to turn all the way with RW before sitting down   When taking a lap with RW, pt needing supervision/CGA with cues for taking bigger steps. Pt without any motor freezing during gait with RW, pt did have some visual freezing, but pt able to continue  ambulating without stopping. Pt did bump into one object on her R side during ambulation.   Pt reporting that she uses a hurrycane in the house and out to church, PT wanted to assess how pt ambulated with this, pt unable to see her cane when she is placing it and sometimes places it too far out to the side or right in front of her with pt almost tripping over cane. Pt also unable to sequence cane correctly. Educated on safety and balance concerns with use of cane and that PT would want pt to use RW at all times, pt's sister in agreement   Provided new tennis balls to pt's walker that were not cut as much and pt reporting that they felt like they were able to stay on her RW easier   Self Care:   Vitals:   07/04/23 0810  BP: (!) 151/74  Pulse: 86   Elevated but WFL for therapy  Pt reporting freezing episodes without use of AD in the house, went over what freezing is in regards to Parkinsonism and that therapy will work on strategies to address.   Educated on use of RW at all times during gait for safety and to decr fall risk instead of use of hurry cane   Therapeutic Activity:  TRANSFERS: Assistive device utilized: 2WW Sit to stand: performed with CGA with initial cues to have BUE support from mat table  Stand to sit: poor eccentric control for final part of descent and either RLE or BLE kicks out from the ground  With sit <> stands, performed approx. 10-12 reps throughout session with PT cueing pt for slowed pace as pt tries to hurriedly stand and ends up having significant retropulsion against mat table. Went over taking it step by step, scooting out towards the edge, TUCKING FEET BACK (pt unable to see where her feet are, but discussed still saying out loud this step and pt bringing feet back with pt still able to perform), and incr forward lean. Pt using BUE support from chair and pt did much better when focusing on trying to bring feet back. Discussed also having her family give her a  cue for this. Pt still with some more weight on heels when standing, but overall much better and pt able to stand up on first attempt. When sitting back down to mat table, cued to reach back and focus on slowed pace and eccentric control. Pt does well until the last second when she sits on mat table and then RLE tends to come up from the floor   PATIENT EDUCATION: Education details: See self-care section, use of RW at all times for safety, sit <> stands technique and addition to HEP  Person educated: Patient and Caregiver sister Education method: Programmer, multimedia, Demonstration, Verbal cues, and Handouts Education comprehension: verbalized understanding, returned demonstration, verbal cues required, and needs further education  HOME EXERCISE PROGRAM: Access Code: [email or web address] URL: [email or web address] Date: 07/04/2023 Prepared by: Sherlie Ban  Exercises - Sit to Stand with Armchair  - 2 x daily - 7 x weekly - 1 sets - 10 reps  GOALS: Goals reviewed with patient? Yes  LONG TERM GOALS: Target date: 08/12/2023 (STG = LTG due to POC length)  Patient and caregiver will demonstrate independence with final HEP in order to maintain current gains and continue to progress after physical therapy discharge.   Baseline: To be provided  Goal status: INITIAL  2.  Patient will improve their 5x Sit to Stand score to less than 49 seconds with minimal necessary UE use to demonstrate a decreased risk for falls and improved LE strength.   Baseline: 56.9 seconds with bilateral UE use/retropulsive (CGA) Goal status: INITIAL  3.  Patient will improve gait speed to 1.52 ft/sec with LRAD to indicate progress towards reduction in falls risk and meet MCID for meaningful change in gait speed.   Baseline: 1.19 ft/sec  Goal status: INITIAL  4.  Patient will improve TUG score to 45 seconds to indicate clinically significant progress towards a decreased risk of falls and improved mobility.  Baseline: 54  seconds with 2WW + CGA and mod verbal cues  Goal status: INITIAL  5. Family members will verbalize long term plan for patient to have safe entry in home as currently requires use of stairs with single handrail.  Baseline: Stairs with single rail - discussed ramped entry etc in future Goal status: INITIAL    ASSESSMENT:  CLINICAL IMPRESSION: Trialed gait with hurrycane as pt reports that this is what she uses at home and when she is going to church. Pt just using RW for longer distances out in the community. Pt needing CGA/min A with cane and due to visual deficits, pt unable to see where she is placing her cane. Discussed using RW at all times for safety as significant safety concerns with use of hurrycane. Pt verbalized understanding. Remainder of session focused on sit <> stand technique with big focus on tucking feet back for improved mechanics. Pt unable to see her feet move back, but still able to perform this step. Pt with improved technique doing this and did not have any significant retropulsive episodes against mat table. Added this to HEP and will continue to practice in therapy. Will continue per POC.   OBJECTIVE IMPAIRMENTS: Abnormal gait, decreased activity tolerance, decreased balance, decreased cognition, decreased knowledge of use of DME, difficulty walking, decreased strength, decreased safety awareness, and impaired vision/preception.   ACTIVITY LIMITATIONS: carrying, lifting, sitting, standing, squatting, transfers, reach over head, and locomotion level  PARTICIPATION LIMITATIONS: meal prep, cleaning, laundry, driving, community activity, and yard work  PERSONAL FACTORS: Age, Time since onset of injury/illness/exacerbation, and 1-2 comorbidities: see above  are also affecting patient's functional outcome.   REHAB POTENTIAL: Fair given progression of PSP  CLINICAL DECISION MAKING: Evolving/moderate complexity  EVALUATION COMPLEXITY: Moderate  PLAN:  PT FREQUENCY:  2x/week  PT DURATION: 4 weeks  PLANNED INTERVENTIONS: 97164- PT Re-evaluation, 97110-Therapeutic exercises, 97530- Therapeutic activity, 97112- Neuromuscular re-education, 97535- Self Care, 16109- Manual therapy, and 97116- Gait training  PLAN FOR NEXT SESSION: update HEP to make appropriate for current function, work on safety with 2WW with transfers, assess mat mobility, anterior weight shift with transfers, caregiver training for safety with visual freezing, long term plan for patient getting into and out of home (currently stairs)  Drake Leach, PT, DPT 07/04/2023, 8:45 AM

## 2023-07-04 NOTE — Addendum Note (Signed)
 Addended by: Glean Salvo on: 07/04/2023 06:09 AM   Modules accepted: Orders

## 2023-07-07 ENCOUNTER — Other Ambulatory Visit: Payer: Self-pay

## 2023-07-07 ENCOUNTER — Other Ambulatory Visit (HOSPITAL_COMMUNITY): Payer: Self-pay

## 2023-07-07 MED FILL — Telmisartan-Hydrochlorothiazide Tab 40-12.5 MG: ORAL | 30 days supply | Qty: 30 | Fill #4 | Status: AC

## 2023-07-07 MED FILL — Atorvastatin Calcium Tab 10 MG (Base Equivalent): ORAL | 30 days supply | Qty: 30 | Fill #3 | Status: AC

## 2023-07-08 ENCOUNTER — Encounter: Payer: Self-pay | Admitting: Physical Therapy

## 2023-07-08 ENCOUNTER — Ambulatory Visit: Attending: Internal Medicine | Admitting: Physical Therapy

## 2023-07-08 VITALS — BP 144/80 | HR 91

## 2023-07-08 DIAGNOSIS — R2689 Other abnormalities of gait and mobility: Secondary | ICD-10-CM | POA: Insufficient documentation

## 2023-07-08 DIAGNOSIS — M6281 Muscle weakness (generalized): Secondary | ICD-10-CM | POA: Diagnosis not present

## 2023-07-08 DIAGNOSIS — R471 Dysarthria and anarthria: Secondary | ICD-10-CM | POA: Insufficient documentation

## 2023-07-08 DIAGNOSIS — R278 Other lack of coordination: Secondary | ICD-10-CM | POA: Insufficient documentation

## 2023-07-08 DIAGNOSIS — R293 Abnormal posture: Secondary | ICD-10-CM | POA: Diagnosis not present

## 2023-07-08 DIAGNOSIS — R2681 Unsteadiness on feet: Secondary | ICD-10-CM | POA: Diagnosis not present

## 2023-07-08 DIAGNOSIS — R41842 Visuospatial deficit: Secondary | ICD-10-CM | POA: Diagnosis present

## 2023-07-08 DIAGNOSIS — R41841 Cognitive communication deficit: Secondary | ICD-10-CM | POA: Diagnosis present

## 2023-07-08 DIAGNOSIS — R41840 Attention and concentration deficit: Secondary | ICD-10-CM | POA: Diagnosis present

## 2023-07-08 DIAGNOSIS — R1310 Dysphagia, unspecified: Secondary | ICD-10-CM | POA: Insufficient documentation

## 2023-07-08 NOTE — Therapy (Signed)
 OUTPATIENT PHYSICAL THERAPY NEURO TREATMENT   Patient Name: Sheronda Vilikia Watkins [place] MRN: [id number] DOB:[birth year], 73 y.o., female Today's Date: 07/08/2023   PCP: Dorothyann Peng, MD REFERRING PROVIDER: Glean Salvo, NP  END OF SESSION:  PT End of Session - 07/08/23 1057     Visit Number 3    Number of Visits 9    Date for PT Re-Evaluation 08/12/23    Authorization Type [employer] Dual Complete    PT Start Time 1104    PT Stop Time 1145    PT Time Calculation (min) 41 min    Equipment Utilized During Treatment Gait belt    Activity Tolerance Patient tolerated treatment well    Behavior During Therapy WFL for tasks assessed/performed             Past Medical History:  Diagnosis Date   Diabetes mellitus without complication (HCC)    Hypertension    Parkinson disease (HCC)    Substance abuse (HCC)    Past Surgical History:  Procedure Laterality Date   EYE SURGERY     Patient Active Problem List   Diagnosis Date Noted   OSA on CPAP 05/17/2023   Anemia 04/03/2023   Supranuclear palsy (HCC) 04/03/2023   Atypical syncope 03/04/2023   Seizure disorder (HCC) 03/04/2023   Seizure-like activity (HCC) 03/02/2023   Thrombocytosis 12/06/2022   Gait abnormality 12/06/2022   Encounter for annual physical exam 11/24/2022   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   Decreased dorsalis pedis pulse 11/17/2020   Tachycardia 08/11/2020   Cough 08/11/2020   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   Pure hypercholesterolemia 01/23/2020   Vitamin D deficiency 10/23/2019   Right hip pain 05/15/2018   Estrogen deficiency 05/15/2018   Class 1 obesity due to excess calories with serious comorbidity and body mass index (BMI) of 31.0 to 31.9 in adult 05/15/2018   Diabetes mellitus with stage 2 chronic kidney disease (HCC) 01/11/2018   Benign hypertension with chronic kidney disease, stage II 01/11/2018    ONSET DATE: 06/20/2023 (referral date)   REFERRING DIAG: G23.1  (ICD-10-CM) - Supranuclear palsy (HCC)  THERAPY DIAG:  Unsteadiness on feet  Other abnormalities of gait and mobility  Muscle weakness (generalized)  Abnormal posture  Rationale for Evaluation and Treatment: Rehabilitation  SUBJECTIVE:                                                                                                                                                                                             SUBJECTIVE STATEMENT: Patient reports that she is sometimes using her walker at home but has a few places that are hard to  get to. Reports working on sit to stands at home and [name] reports that she is doing better tucking her feet thought sometimes still forgets. Family reports that the goal is to keep [place] home as long as time.   Pt accompanied by: self and sister [name]  PERTINENT HISTORY: PSP  PAIN:  Are you having pain? No  PRECAUTIONS: None  RED FLAGS: None   WEIGHT BEARING RESTRICTIONS: No  FALLS: Has patient fallen in last 6 months? Yes. Number of falls 3 - see above for details   LIVING ENVIRONMENT: Lives with: lives with their family - brother Lives in: House/apartment Stairs: Yes: External: 3 stairs steps; bilateral but cannot reach both - previously had a ramp but not at this time Has following equipment at home: Single point cane and Walker - 2 wheeled  PLOF:  Patient was last living independently about a year ago (9 months) and also stopped driving about 9 months   PATIENT GOALS: "To learn how to function."   OBJECTIVE:  Note: Objective measures were completed at Evaluation unless otherwise noted.  COGNITION: Overall cognitive status: Impaired - patient required frequent prompting from sister for correction on dates                                 TREATMENT :    Vitals:   07/08/23 1119  BP: (!) 144/80  Pulse: 91   Self Care: Vitals assessed as noted above Seated assessed on LUE at rest, mildly elevated but [facility] for  therapy Educated patient and caregiver on talking with PCP/neurologist about looking into care aids through insurance at home; explained that was not indicated at this time but may be helpful in the future and is good to think ahead to have support in place or know what will/will not be covered, patient and caregiver both verbalized understanding/agreement   TherAct: Bed mobility  Practiced supine to sit and sit to supine 4x over course of session going down to the L side, patient was modI with only min verbal cues for safety with distance form EOM, reviewed with caregivers tips for were to assist patient if ever needed support coming to sit at hips and shoulder; educated to not pull from arms and utilized side roll and then legs over bed technique  Practiced rolling, required min verbal cues on LE placement with bending opposite leg to assist with roll as well as cross body reaching for momentum, patient progressed from CGA to modI, also practiced with caregiver hands on assistance as to where to provide hand placement and assistance with rolling at home, provided printout for caregiver education on this technique if needed in future, patient notably with greater challenging rolling to the R than L as does not currently roll this way at home, encouraged practice rolling both directions at home with caregiver supervision progressing to modI if not rolling too close to EOB Worked on scooting in bed, patient initially with L leg straight and only R leg bent, encouraged use of bridge position to maximize hip clearance, demonstrated to caregiver where could provide hip lift/support, performed ~ 6 lateral and upward scoots over course of session with SBA Patient currently has regular bed at home but family has old hospital bed in house that is not currently used, discussed may be appropriate in future to assist with bed mobility as well as educated about hand rails that can be used to adapt patient's  current bed,  provided printout of examples of these, explained not needed at this time but may be applicable in future, educate on how rails should be appropriately used with bed mobility and rolling   TRANSFERS: Assistive device utilized: 2WW Sit to stand: required intermittent minA due to retropulsion today otherwise CGA-SBA Stand to sit: poor eccentric control for final part of descent and either RLE or BLE kicks out from the ground consistent with what seen in prior sessions   Continue to review foot tuck to maxmize anterior weight shift with intermittent success in today's sessions, 3x instances requiring minA to prevent significant posterior retropulsion but otherwise CGA-SBA, 1 rep with near perfect anterior weight shift and recommend continued work in future   GAIT: Gait pattern: step through pattern, decreased stride length more notably on R side  Distance walked: Various clinic distances, ~2 x 100 feet, 1 x 50 feet   Assistive device utilized: RW  Comments: Pt ambulating into clinic with RW, requires min verbal cues 1x to place wheels of walker back on ground when turning to walk back into session otherwise performs without cueing   Discussed clearing space in home for access with RW to all places in home as patient should only be walking with RW at home, patient and caregiver verbalize understanding   PATIENT EDUCATION: Education details: See self-care section, use of RW at all times for safety, sit <> stands technique and addition to HEP  Person educated: Patient and Caregiver sister Education method: Programmer, multimedia, Facilities manager, Verbal cues, and Handouts Education comprehension: verbalized understanding, returned demonstration, verbal cues required, and needs further education  HOME EXERCISE PROGRAM: Access Code: [email or web address] URL: [email or web address] Date: 07/04/2023 Prepared by: Sherlie Ban  Exercises - Sit to Stand with Armchair  - 2 x daily - 7 x weekly - 1 sets - 10  reps   - Rolling both ways in bed a few times a day with caregiver supervision initially  - Bridge scoot in bed practice when going to bed 1x a day   GOALS: Goals reviewed with patient? Yes  LONG TERM GOALS: Target date: 08/12/2023 (STG = LTG due to POC length)  Patient and caregiver will demonstrate independence with final HEP in order to maintain current gains and continue to progress after physical therapy discharge.   Baseline: To be provided  Goal status: INITIAL  2.  Patient will improve their 5x Sit to Stand score to less than 49 seconds with minimal necessary UE use to demonstrate a decreased risk for falls and improved LE strength.   Baseline: 56.9 seconds with bilateral UE use/retropulsive (CGA) Goal status: INITIAL  3.  Patient will improve gait speed to 1.52 ft/sec with LRAD to indicate progress towards reduction in falls risk and meet MCID for meaningful change in gait speed.   Baseline: 1.19 ft/sec  Goal status: INITIAL  4.  Patient will improve TUG score to 45 seconds to indicate clinically significant progress towards a decreased risk of falls and improved mobility.  Baseline: 54 seconds with 2WW + CGA and mod verbal cues  Goal status: INITIAL  5. Family members will verbalize long term plan for patient to have safe entry in home as currently requires use of stairs with single handrail.  Baseline: Stairs with single rail - discussed ramped entry etc in future Goal status: INITIAL    ASSESSMENT:  CLINICAL IMPRESSION: Large majority of PT session spent working on optimizing bed mobility with caregiver training on how to either verbally/physically assist patient as  needed in the future. Patient majority modI with bed mobility at end of session progressing from min verbal cues to none by end of session. Encouraged family to reach out to PCP or MD about care aid options with current insurance to know what options they would have if this becomes something needed in future.  Recommended continued work on  bidirectional rolling as to the R more challenging for patient as well as work on bridging techinique to scoot in bed. Continue to review RW compliance at all times at home as well as transfer safety. Patient with increased retropulsion from last session. Will continue per POC.   OBJECTIVE IMPAIRMENTS: Abnormal gait, decreased activity tolerance, decreased balance, decreased cognition, decreased knowledge of use of DME, difficulty walking, decreased strength, decreased safety awareness, and impaired vision/preception.   ACTIVITY LIMITATIONS: carrying, lifting, sitting, standing, squatting, transfers, reach over head, and locomotion level  PARTICIPATION LIMITATIONS: meal prep, cleaning, laundry, driving, community activity, and yard work  PERSONAL FACTORS: Age, Time since onset of injury/illness/exacerbation, and 1-2 comorbidities: see above  are also affecting patient's functional outcome.   REHAB POTENTIAL: Fair given progression of PSP  CLINICAL DECISION MAKING: Evolving/moderate complexity  EVALUATION COMPLEXITY: Moderate  PLAN:  PT FREQUENCY: 2x/week  PT DURATION: 4 weeks  PLANNED INTERVENTIONS: 97164- PT Re-evaluation, 97110-Therapeutic exercises, 97530- Therapeutic activity, 97112- Neuromuscular re-education, 97535- Self Care, 16109- Manual therapy, and 97116- Gait training  PLAN FOR NEXT SESSION: update HEP to make appropriate for current function, work on safety with 2WW with transfers, anterior weight shift with transfers, caregiver training for safety with visual freezing, long term plan for patient getting into and out of home (currently stairs), continue to work on bridge boost with bed mobility for hip clearance, part practice trial for floor recovery on mat   Carmelia Bake, PT, DPT 07/08/2023, 12:21 PM

## 2023-07-11 ENCOUNTER — Ambulatory Visit: Admitting: Physical Therapy

## 2023-07-11 ENCOUNTER — Other Ambulatory Visit: Payer: Self-pay | Admitting: Internal Medicine

## 2023-07-11 ENCOUNTER — Encounter: Payer: Self-pay | Admitting: Physical Therapy

## 2023-07-11 ENCOUNTER — Other Ambulatory Visit: Payer: Self-pay

## 2023-07-11 ENCOUNTER — Ambulatory Visit: Admitting: Occupational Therapy

## 2023-07-11 VITALS — BP 144/69 | HR 84

## 2023-07-11 DIAGNOSIS — R41840 Attention and concentration deficit: Secondary | ICD-10-CM

## 2023-07-11 DIAGNOSIS — R41842 Visuospatial deficit: Secondary | ICD-10-CM

## 2023-07-11 DIAGNOSIS — R2689 Other abnormalities of gait and mobility: Secondary | ICD-10-CM | POA: Diagnosis not present

## 2023-07-11 DIAGNOSIS — R1310 Dysphagia, unspecified: Secondary | ICD-10-CM | POA: Diagnosis not present

## 2023-07-11 DIAGNOSIS — R278 Other lack of coordination: Secondary | ICD-10-CM | POA: Diagnosis not present

## 2023-07-11 DIAGNOSIS — M6281 Muscle weakness (generalized): Secondary | ICD-10-CM

## 2023-07-11 DIAGNOSIS — R2681 Unsteadiness on feet: Secondary | ICD-10-CM

## 2023-07-11 DIAGNOSIS — R471 Dysarthria and anarthria: Secondary | ICD-10-CM | POA: Diagnosis not present

## 2023-07-11 DIAGNOSIS — R293 Abnormal posture: Secondary | ICD-10-CM | POA: Diagnosis not present

## 2023-07-11 NOTE — Telephone Encounter (Signed)
 Copied from CRM [id number]. Topic: Clinical - Medication Refill >> Jul 11, 2023 12:02 PM Florestine Avers wrote: Most Recent Primary Care Visit:  Provider: Marlyn Corporal  Department: [facility]  Visit Type: NURSE VISIT  Date: 06/17/2023  Medication: Semaglutide,0.25 or 0.5MG /DOS, (OZEMPIC, 0.25 OR 0.5 MG/DOSE,) 2 MG/3ML SOPN  Has the patient contacted their pharmacy? Yes (Agent: If no, request that the patient contact the pharmacy for the refill. If patient does not wish to contact the pharmacy document the reason why and proceed with request.) (Agent: If yes, when and what did the pharmacy advise?)  Is this the correct pharmacy for this prescription? Yes If no, delete pharmacy and type the correct one.  This is the patient's preferred pharmacy:   [facility] on [address]   Has the prescription been filled recently? No  Is the patient out of the medication? Yes  Has the patient been seen for an appointment in the last year OR does the patient have an upcoming appointment? Yes  Can we respond through MyChart? No  Agent: Please be advised that Rx refills may take up to 3 business days. We ask that you follow-up with your pharmacy.

## 2023-07-11 NOTE — Therapy (Addendum)
 OUTPATIENT OCCUPATIONAL THERAPY NEURO EVALUATION  Patient Name: April Larson MRN: [id number] DOB:1950/07/19, 73 y.o., female Today's Date: 07/11/2023  PCP: Dorothyann Peng, MD  REFERRING PROVIDER: Glean Salvo, NP   END OF SESSION:  OT End of Session - 07/11/23 1218     Visit Number 1    Number of Visits 9   including eval   Date for OT Re-Evaluation 08/12/23    Authorization Type [employer] Dual, no auth required    OT Start Time 1017    OT Stop Time 1057    OT Time Calculation (min) 40 min    Activity Tolerance Patient tolerated treatment well    Behavior During Therapy WFL for tasks assessed/performed             Past Medical History:  Diagnosis Date   Diabetes mellitus without complication (HCC)    Hypertension    Parkinson disease (HCC)    Substance abuse (HCC)    Past Surgical History:  Procedure Laterality Date   EYE SURGERY     Patient Active Problem List   Diagnosis Date Noted   OSA on CPAP 05/17/2023   Anemia 04/03/2023   Supranuclear palsy (HCC) 04/03/2023   Atypical syncope 03/04/2023   Seizure disorder (HCC) 03/04/2023   Seizure-like activity (HCC) 03/02/2023   Thrombocytosis 12/06/2022   Gait abnormality 12/06/2022   Encounter for annual physical exam 11/24/2022   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   Decreased dorsalis pedis pulse 11/17/2020   Tachycardia 08/11/2020   Cough 08/11/2020   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   Pure hypercholesterolemia 01/23/2020   Vitamin D deficiency 10/23/2019   Right hip pain 05/15/2018   Estrogen deficiency 05/15/2018   Class 1 obesity due to excess calories with serious comorbidity and body mass index (BMI) of 31.0 to 31.9 in adult 05/15/2018   Diabetes mellitus with stage 2 chronic kidney disease (HCC) 01/11/2018   Benign hypertension with chronic kidney disease, stage II 01/11/2018    ONSET DATE: 07/04/2023 (referral date)  REFERRING DIAG:  R47.1 (ICD-10-CM) - Dysarthria and  anarthria  R41.841 (ICD-10-CM) - Cognitive communication deficit  R13.10 (ICD-10-CM) - Dysphagia, unspecified type  G20.C (ICD-10-CM) - Primary parkinsonism (HCC)  R41.842 (ICD-10-CM) - Visuospatial deficit  R29.3 (ICD-10-CM) - Abnormal posture  R41.840 (ICD-10-CM) - Attention and concentration deficit  M62.81 (ICD-10-CM) - Muscle weakness (generalized)   07/04/23 Referral Notes: PSP   THERAPY DIAG:  Muscle weakness (generalized) - Plan: Ot plan of care cert/re-cert  Unsteadiness on feet - Plan: Ot plan of care cert/re-cert  Visuospatial deficit - Plan: Ot plan of care cert/re-cert  Attention and concentration deficit - Plan: Ot plan of care cert/re-cert  Other lack of coordination - Plan: Ot plan of care cert/re-cert  Rationale for Evaluation and Treatment: Rehabilitation  SUBJECTIVE:   SUBJECTIVE STATEMENT: Pt reported difficulty with speech and balance. When asked about specific concerns, pt reported "nothing really." Pt's sister elaborated that pt is having difficulty with vision and picking up objects with hands, pt is demo'ing cognition concerns, and pt seems unaware of need for therapy. Per pt's sister, pt is "very strong-willed."   Pt's sister reported pt last came to clinic about 2 years ago for same dx, and the dx "has progressed considerably." Pt had HH nursing and therapy for several months but "the structure was not there."  Pt accompanied by: self and sister [name])  PERTINENT HISTORY: PSP, OSA on CPAP, HTN, DM, hx of seizure/seizure-like activity, hx of substance abuse  PRECAUTIONS: Fall,  no driving, To reduce fall risk: SBA or CGA when walking, recommended to stabilize chair when pt completes sit-to-stand/stand-to-sit transitions d/t tendency for retropulsion. Recommended increased v/c secondary visual deficits.  WEIGHT BEARING RESTRICTIONS: No  PAIN:  Are you having pain? No  FALLS: Has patient fallen in last 6 months? Yes. Number of falls 5-6 falls, 1 fall  occurred day before yesterday - pt fell back when walking d/t focusing on something else     LIVING ENVIRONMENT: Lives with: lives with their family - brother Lives in: House/apartment Stairs: Yes: External: 3 stairs steps; bilateral but cannot reach both - previously had a ramp but not at this time Has following equipment at home: Single point cane and Walker - 2 wheeled  PLOF: Patient was last living independently about a year ago (9 months) and also stopped driving about 9 months   PATIENT GOALS: "to be able to pick up stuff"  OBJECTIVE:  Note: Objective measures were completed at Evaluation unless otherwise noted.  HAND DOMINANCE: Right  ADLs: Overall ADLs:  Transfers/ambulation related to ADLs: Eating: difficulty with swallowing, supposed to eat small portions, speeds up when eating Grooming: difficulty with toothbrushing - standard toothbrush UB Dressing: ind with extra time LB Dressing: unable to look down therefore difficult, assistance from family Toileting: ind Bathing: ind for completing washing tasks, shower chair, grab bars - family direct supervision Tub Shower transfers: walk-in shower Equipment: Shower seat with back, Grab bars, and Walk in shower  IADLs: dependent  MOBILITY STATUS: Hx of falls and 2-wheeled walker, "I do freeze"   POSTURE COMMENTS:  Sitting ind  ACTIVITY TOLERANCE: Activity tolerance: Pt reported feeling more tired throughout the day.  FUNCTIONAL OUTCOME MEASURES: PSFS: 1.3    Total score = sum of the activity scores/number of activities Minimum detectable change (90%CI) for average score = 2 points Minimum detectable change (90%CI) for single activity score = 3 points   UPPER EXTREMITY ROM:    Active ROM Right eval Left eval  Shoulder flexion Approx. 90* Approx. 90*  Shoulder abduction    Shoulder adduction    Shoulder extension    Shoulder internal rotation Pt able to reach behind head though pt reported tightness in  shoulders, RUE tightness >LUE tightness Pt able to reach behind head though pt reported tightness in shoulders, RUE tightness >LUE tightness  Shoulder external rotation    Elbow flexion [facility] WFL  Elbow extension WFL, v/c for full ext [facility]  Wrist flexion [facility] WFL  Wrist extension [facility] WFL  Wrist ulnar deviation    Wrist radial deviation    Wrist pronation [facility] WFL  Wrist supination WFL WFL  (Blank rows = not tested)  BUE digit flex/ext - WFL, additional v/c for full ext of thumbs  UPPER EXTREMITY MMT:     MMT Right eval Left eval  Shoulder flexion    Shoulder abduction    Shoulder adduction    Shoulder extension    Shoulder internal rotation    Shoulder external rotation    Middle trapezius    Lower trapezius    Elbow flexion    Elbow extension    Wrist flexion    Wrist extension    Wrist ulnar deviation    Wrist radial deviation    Wrist pronation    Wrist supination    (Blank rows = not tested)  HAND FUNCTION: Grip strength: Right: 28.6, 26.6, 20.7 (25.3 lbs average) lbs; Left: 28.6, 27.5, 25.7 (27.3 lbs average)  lbs  COORDINATION: Box  and Blocks:  Right 18blocks, Left 26blocks  SENSATION: Pt denied numbness/tingling  EDEMA: None noted  MUSCLE TONE: RUE: Within functional limits and LUE: Within functional limits  COGNITION: Overall cognitive status: Impaired Decreased insight into deficits and decreased safety awareness based on observation and per pt's sister report.   VISION: Subjective report: Pt's sister reported pt frequently looks up, fixed gaze and no peripheral vision.  Baseline vision: Wears glasses all the time for distance vision.  Visual history:  None noted , had corrective cataracts surgery both eyes  VISION ASSESSMENT: OT noted pt demo'd reduced eye blinking and often demo'd fixed upward gaze. Pt sometimes changed direction of fixed gaze towards a task or towards a person if another person was talking. However, pt generally unable to focus  directly on task or directly on person talking.   PERCEPTION: Not tested  PRAXIS: Not tested  OBSERVATIONS: Pt was pleasant. Pt accompanied by family member. Pt demo'd fixed upward gaze though able to sometimes deviate gaze during conversation to attempt eye contact or to partially look at task. Pt demo'd decreased safety awareness and decreased insight into deficits. Pt benefited from SBA to CGA and gait belt during ambulation with 2WW to reduce fall risk. Pt benefited from v/c to keep walker close to pt and to keep walker on ground. Pt benefited from continuous v/c to navigate environment and to complete sit-to-stand/stand-to-sit transfers. Pt benefited from OT stabilizing chair when completing sit-to-stand/stand-to-sit transfers d/t pt demo'ing retropulsion when sitting down.                                                                                                                            TREATMENT DATE: 07/11/23   OT educated pt and family on OT role, POC, clinic late/no-show policy, A/E options for phone (e.g. adapted phone, safety restrictions on phone, Alexa or other home devices), safety with ADLs, recommended use of shower chair for bathing/showering tasks and completing dressing tasks while seated, prognosis, dx. Pt and family acknowledged understanding of all. D/t cognition concerns, pt would benefit from additional review of safety during functional tasks.  PATIENT EDUCATION: Education details: see today's tx above Person educated: Patient and family member Education method: Explanation Education comprehension: verbalized understanding  HOME EXERCISE PROGRAM: TBD  GOALS: Goals reviewed with patient? Yes  SHORT TERM GOALS: Target date: 07/25/23  Pt and caregiver will return demo of BUE HEP using visual handouts and no more than min v/c. Baseline: Pt and family reported pt does not have HEP from previous OT POC a few years ago. Goal status: INITIAL  2.  Pt and caregiver  will verbalize understanding of A/E and adaptive strategies for navigating phone safely.  Baseline: Pt and family reported difficulty with navigating phone. Pt's family expressed concerns that pt has too many apps on phone. Goal status: INITIAL  3.  Pt and caregiver will verbalize understanding of vision compensation strategies and environmental adaptations to promote safety in home environment  for navigation during ambulation.  Baseline: Visual deficits, decreased insights into deficits Goal status: INITIAL   LONG TERM GOALS: Target date: 08/12/23  Pt will maintain BUE grip strength as needed to maintain grasp of toothbrush and eating utensils. Baseline: Grip strength: Right: 28.6, 26.6, 20.7 (25.3 lbs average) lbs; Left: 28.6, 27.5, 25.7 (27.3 lbs average)  lbs Goal status: INITIAL  2.  Pt will maintain FM coordination as evidenced by maintaining box&blocks score of BUE. Baseline: Box and Blocks:  Right 18blocks, Left 26blocks Goal status: INITIAL  3.  Pt and caregiver will verbalize understanding of adaptive strategies and A/E options to promote safety and decrease fall risk during ADL tasks. Baseline: Pt demo'd decreased insight into deficits, decreased safety awareness. Pt and family reported difficulty with holding toothbrush, completing LB dressing, picking up small items, controlling speed of eating.  Goal status: INITIAL  4.  Patient will report at least two-point increase in average PSFS score or at least three-point increase in a single activity score indicating functionally significant improvement given minimum detectable change using A/E and adaptive strategies PRN. Baseline: PSFS: 1.3 total score (See above for individual activity scores)  Goal status: INITIAL  ASSESSMENT:  CLINICAL IMPRESSION: Patient is a 73 y.o. female who was seen today for occupational therapy evaluation for PSP and associated symptoms. Hx includes PSP, OSA on CPAP, HTN, DM, hx of seizure/seizure-like  activity, hx of substance abuse. Patient currently presents below baseline level of functioning demonstrating functional deficits and impairments as noted below. The patient requires the skill of OT to help with ongoing exercise and mobility to prevent rapid and significant decline in functional mobility, strength, and ADL function. Pt and caregivers would also benefit from education regarding safety, A/E, and adaptive strategies in the home environment. Therefore, skilled maintenance OT is recommended for this patient.  PERFORMANCE DEFICITS: in functional skills including ADLs, IADLs, coordination, dexterity, proprioception, sensation, ROM, strength, flexibility, Fine motor control, Gross motor control, mobility, balance, body mechanics, endurance, decreased knowledge of precautions, decreased knowledge of use of DME, vision, and UE functional use, cognitive skills including attention, energy/drive, learn, memory, problem solving, safety awareness, and sequencing, and psychosocial skills including environmental adaptation.   IMPAIRMENTS: are limiting patient from ADLs, IADLs, rest and sleep, leisure, and social participation.   CO-MORBIDITIES: has co-morbidities such as PSP  that affects occupational performance. Patient will benefit from skilled OT to address above impairments and improve overall function.  MODIFICATION OR ASSISTANCE TO COMPLETE EVALUATION: Maximum or significant modification of tasks or assist is necessary to complete an evaluation.  OT OCCUPATIONAL PROFILE AND HISTORY: Detailed assessment: Review of records and additional review of physical, cognitive, psychosocial history related to current functional performance.  CLINICAL DECISION MAKING: High - multiple treatment options, significant modification of task necessary  REHAB POTENTIAL: Fair d/t progression of PSP  EVALUATION COMPLEXITY: High    PLAN:  OT FREQUENCY: 2x/week  OT DURATION: 4 weeks  PLANNED INTERVENTIONS:  97168 OT Re-evaluation, 97535 self care/ADL training, 16109 therapeutic exercise, 97530 therapeutic activity, 97112 neuromuscular re-education, 97140 manual therapy, 97035 ultrasound, 97018 paraffin, 60454 fluidotherapy, 97010 moist heat, 97010 cryotherapy, 97760 Orthotics management and training, 09811 Splinting (initial encounter), [id number] Subsequent splinting/medication, passive range of motion, functional mobility training, visual/perceptual remediation/compensation, energy conservation, patient/family education, and DME and/or AE instructions  RECOMMENDED OTHER SERVICES: PT eval already completed. SLP referral placed.  CONSULTED AND AGREED WITH PLAN OF CARE: Patient  PLAN FOR NEXT SESSION:  Discuss strategies to reduce eating speed A/E options: electric  toothbrush, options to navigate phone (voice control, Alexa, restrictions on phone, adaptive phone, etc.) ADL safety - LB dressing, showering HEP - FM coordination (limit to 1-3 exercises), Strengthening (limit 1-3 exercises - e.g. wringing towel)   Wynetta Emery, OT 07/11/2023, 1:24 PM

## 2023-07-11 NOTE — Therapy (Signed)
 OUTPATIENT PHYSICAL THERAPY NEURO TREATMENT   Patient Name: April Larson MRN: [id number] DOB:1950/05/09, 73 y.o., female Today's Date: 07/11/2023   PCP: Dorothyann Peng, MD REFERRING PROVIDER: Glean Salvo, NP  END OF SESSION:  PT End of Session - 07/11/23 1426     Visit Number 4    Number of Visits 9    Date for PT Re-Evaluation 08/12/23    Authorization Type [employer] Dual Complete    PT Start Time 1102    PT Stop Time 1145    PT Time Calculation (min) 43 min    Equipment Utilized During Treatment Gait belt    Activity Tolerance Patient tolerated treatment well    Behavior During Therapy WFL for tasks assessed/performed              Past Medical History:  Diagnosis Date   Diabetes mellitus without complication (HCC)    Hypertension    Parkinson disease (HCC)    Substance abuse (HCC)    Past Surgical History:  Procedure Laterality Date   EYE SURGERY     Patient Active Problem List   Diagnosis Date Noted   OSA on CPAP 05/17/2023   Anemia 04/03/2023   Supranuclear palsy (HCC) 04/03/2023   Atypical syncope 03/04/2023   Seizure disorder (HCC) 03/04/2023   Seizure-like activity (HCC) 03/02/2023   Thrombocytosis 12/06/2022   Gait abnormality 12/06/2022   Encounter for annual physical exam 11/24/2022   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   Decreased dorsalis pedis pulse 11/17/2020   Tachycardia 08/11/2020   Cough 08/11/2020   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   Pure hypercholesterolemia 01/23/2020   Vitamin D deficiency 10/23/2019   Right hip pain 05/15/2018   Estrogen deficiency 05/15/2018   Class 1 obesity due to excess calories with serious comorbidity and body mass index (BMI) of 31.0 to 31.9 in adult 05/15/2018   Diabetes mellitus with stage 2 chronic kidney disease (HCC) 01/11/2018   Benign hypertension with chronic kidney disease, stage II 01/11/2018    ONSET DATE: 06/20/2023 (referral date)   REFERRING DIAG: G23.1  (ICD-10-CM) - Supranuclear palsy (HCC)  THERAPY DIAG:  Unsteadiness on feet  Other abnormalities of gait and mobility  Muscle weakness (generalized)  Abnormal posture  Rationale for Evaluation and Treatment: Rehabilitation  SUBJECTIVE:                                                                                                                                                                                             SUBJECTIVE STATEMENT: Patient reports that she has still not had any changes to medications since last here. Denies falls and near  falls. Reports that she is still not using walker much at home as they had not modified home environment. She did work on other exercises at home.   Pt accompanied by: self and sister [name]  PERTINENT HISTORY: PSP  PAIN:  Are you having pain? No  PRECAUTIONS: None  RED FLAGS: None   WEIGHT BEARING RESTRICTIONS: No  FALLS: Has patient fallen in last 6 months? Yes. Number of falls 3 - see above for details   LIVING ENVIRONMENT: Lives with: lives with their family - brother Lives in: House/apartment Stairs: Yes: External: 3 stairs steps; bilateral but cannot reach both - previously had a ramp but not at this time Has following equipment at home: Single point cane and Walker - 2 wheeled  PLOF:  Patient was last living independently about a year ago (9 months) and also stopped driving about 9 months   PATIENT GOALS: "To learn how to function."   OBJECTIVE:  Note: Objective measures were completed at Evaluation unless otherwise noted.  COGNITION: Overall cognitive status: Impaired - patient required frequent prompting from sister for correction on dates                                 TREATMENT :    Vitals:   07/11/23 1103  BP: (!) 144/69  Pulse: 84   Self Care: Vitals assessed as noted above Seated assessed on LUE at rest, mildly elevated but [facility] for therapy Continue to educated on importance of AD use in home to  prevent falls, continue to encourage home environment modifications to prevent falls, patient noncompliant due to home setup and despite education caregivers have not modified at this time, reviewed after discussing freezing and explained how extremely important to prevent falls given known freezing episodes  TherAct:  TRANSFERS: Assistive device utilized: 2WW Sit to stand: required CGA assist today and minA a few times due to instability posteriorly Stand to sit: overall better eccentric control with exception of final two reps on initial 10 practice reps, uses UE support throughout session Continue to review foot tuck to maxmize anterior weight shift with intermittent success in today's sessions, improved with nose over toes cue and educated caregiver x 10 reps, x5 additional reps throughout session with minA 1-2x due to retropulsion but overall improved from last session  Functional Reaching / Weight Shift Task Cross body reaching to overhead target with target lowered to appropriate height for R UE shoulder limitations x 10 reps with large amplitude step (CGA - no LOB but required intermittent cues for stepping distance to maximize weight shift) Same side body reaching at counter over back to work on maintaining stability even with posterior tasks (CGA with no LOB but 1 episode of RLE freezing x 8 reps )  Freezing Strategies: Discussed freezing strategies for home with use of (STOP, SHIFT, SEE target, and STEP to target) and provided printout for family, caregiver/patient unaware of freezing was so educated on what it is and management, discussed how caregivers can cue patient to help Patient reports currently freezing multiple times a day for about 1-2 seconds around TV or in room, recommend practice of freezing strategies for home   PATIENT EDUCATION: Education details: Freezing strategies + continued AD safety  Person educated: Patient and Caregiver sister Education method: Explanation,  Demonstration, Verbal cues, and Handouts Education comprehension: verbalized understanding, returned demonstration, verbal cues required, and needs further education  HOME EXERCISE PROGRAM: Access  Code: [email or web address] URL: [email or web address] Date: 07/04/2023 Prepared by: Sherlie Ban  Exercises - Sit to Stand with Armchair  - 2 x daily - 7 x weekly - 1 sets - 10 reps   - Rolling both ways in bed a few times a day with caregiver supervision initially  - Bridge scoot in bed practice when going to bed 1x a day   GOALS: Goals reviewed with patient? Yes  LONG TERM GOALS: Target date: 08/12/2023 (STG = LTG due to POC length)  Patient and caregiver will demonstrate independence with final HEP in order to maintain current gains and continue to progress after physical therapy discharge.   Baseline: To be provided  Goal status: INITIAL  2.  Patient will improve their 5x Sit to Stand score to less than 49 seconds with minimal necessary UE use to demonstrate a decreased risk for falls and improved LE strength.   Baseline: 56.9 seconds with bilateral UE use/retropulsive (CGA) Goal status: INITIAL  3.  Patient will improve gait speed to 1.52 ft/sec with LRAD to indicate progress towards reduction in falls risk and meet MCID for meaningful change in gait speed.   Baseline: 1.19 ft/sec  Goal status: INITIAL  4.  Patient will improve TUG score to 45 seconds to indicate clinically significant progress towards a decreased risk of falls and improved mobility.  Baseline: 54 seconds with 2WW + CGA and mod verbal cues  Goal status: INITIAL  5. Family members will verbalize long term plan for patient to have safe entry in home as currently requires use of stairs with single handrail.  Baseline: Stairs with single rail - discussed ramped entry etc in future Goal status: INITIAL    ASSESSMENT:  CLINICAL IMPRESSION: Skilled PT session emphasized continued emphasize on use of 2WW with  very poor compliance on home, transfer technique, weight shift work, and education on freezing. Patient with increased episodes of freezing and required education on what it is for both patient/caregiver. Success during session with cues to end freezing episode and provided for home. Will likely require review in future session. Will continue per POC.   OBJECTIVE IMPAIRMENTS: Abnormal gait, decreased activity tolerance, decreased balance, decreased cognition, decreased knowledge of use of DME, difficulty walking, decreased strength, decreased safety awareness, and impaired vision/preception.   ACTIVITY LIMITATIONS: carrying, lifting, sitting, standing, squatting, transfers, reach over head, and locomotion level  PARTICIPATION LIMITATIONS: meal prep, cleaning, laundry, driving, community activity, and yard work  PERSONAL FACTORS: Age, Time since onset of injury/illness/exacerbation, and 1-2 comorbidities: see above  are also affecting patient's functional outcome.   REHAB POTENTIAL: Fair given progression of PSP  CLINICAL DECISION MAKING: Evolving/moderate complexity  EVALUATION COMPLEXITY: Moderate  PLAN:  PT FREQUENCY: 2x/week  PT DURATION: 4 weeks  PLANNED INTERVENTIONS: 97164- PT Re-evaluation, 97110-Therapeutic exercises, 97530- Therapeutic activity, 97112- Neuromuscular re-education, 97535- Self Care, [id number]- Manual therapy, and 97116- Gait training  PLAN FOR NEXT SESSION: update HEP to make appropriate for current function, work on safety with 2WW with transfers, anterior weight shift with transfers, caregiver training for safety with visual freezing, long term plan for patient getting into and out of home (currently stairs), continue to work on bridge boost with bed mobility for hip clearance, part practice trial for floor recovery on mat   Recommend dropping frequency to 1x week or just a few more sessions   Carmelia Bake, PT, DPT 07/11/2023, 2:27 PM

## 2023-07-12 MED ORDER — OZEMPIC (0.25 OR 0.5 MG/DOSE) 2 MG/3ML ~~LOC~~ SOPN: 0.2500 mg | PEN_INJECTOR | SUBCUTANEOUS | 0 refills | Status: DC

## 2023-07-15 ENCOUNTER — Ambulatory Visit: Admitting: Physical Therapy

## 2023-07-15 ENCOUNTER — Encounter: Payer: Self-pay | Admitting: Physical Therapy

## 2023-07-15 DIAGNOSIS — R278 Other lack of coordination: Secondary | ICD-10-CM | POA: Diagnosis not present

## 2023-07-15 DIAGNOSIS — R1310 Dysphagia, unspecified: Secondary | ICD-10-CM | POA: Diagnosis not present

## 2023-07-15 DIAGNOSIS — R2689 Other abnormalities of gait and mobility: Secondary | ICD-10-CM | POA: Diagnosis not present

## 2023-07-15 DIAGNOSIS — M6281 Muscle weakness (generalized): Secondary | ICD-10-CM | POA: Diagnosis not present

## 2023-07-15 DIAGNOSIS — R2681 Unsteadiness on feet: Secondary | ICD-10-CM

## 2023-07-15 DIAGNOSIS — R471 Dysarthria and anarthria: Secondary | ICD-10-CM | POA: Diagnosis not present

## 2023-07-15 DIAGNOSIS — R293 Abnormal posture: Secondary | ICD-10-CM | POA: Diagnosis not present

## 2023-07-15 NOTE — Therapy (Signed)
 OUTPATIENT PHYSICAL THERAPY NEURO TREATMENT   Patient Name: April Larson MRN: [id number] DOB:02-Jun-1950, 73 y.o., female Today's Date: 07/15/2023   PCP: Dorothyann Peng, MD REFERRING PROVIDER: Glean Salvo, NP  END OF SESSION:  PT End of Session - 07/15/23 1108     Visit Number 5    Number of Visits 9    Date for PT Re-Evaluation 08/12/23    Authorization Type [employer] Dual Complete    PT Start Time 1105    PT Stop Time 1143    PT Time Calculation (min) 38 min    Equipment Utilized During Treatment Gait belt    Activity Tolerance Patient tolerated treatment well    Behavior During Therapy WFL for tasks assessed/performed              Past Medical History:  Diagnosis Date   Diabetes mellitus without complication (HCC)    Hypertension    Parkinson disease (HCC)    Substance abuse (HCC)    Past Surgical History:  Procedure Laterality Date   EYE SURGERY     Patient Active Problem List   Diagnosis Date Noted   OSA on CPAP 05/17/2023   Anemia 04/03/2023   Supranuclear palsy (HCC) 04/03/2023   Atypical syncope 03/04/2023   Seizure disorder (HCC) 03/04/2023   Seizure-like activity (HCC) 03/02/2023   Thrombocytosis 12/06/2022   Gait abnormality 12/06/2022   Encounter for annual physical exam 11/24/2022   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   Decreased dorsalis pedis pulse 11/17/2020   Tachycardia 08/11/2020   Cough 08/11/2020   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   Pure hypercholesterolemia 01/23/2020   Vitamin D deficiency 10/23/2019   Right hip pain 05/15/2018   Estrogen deficiency 05/15/2018   Class 1 obesity due to excess calories with serious comorbidity and body mass index (BMI) of 31.0 to 31.9 in adult 05/15/2018   Diabetes mellitus with stage 2 chronic kidney disease (HCC) 01/11/2018   Benign hypertension with chronic kidney disease, stage II 01/11/2018    ONSET DATE: 06/20/2023 (referral date)   REFERRING DIAG: G23.1  (ICD-10-CM) - Supranuclear palsy (HCC)  THERAPY DIAG:  Muscle weakness (generalized)  Unsteadiness on feet  Rationale for Evaluation and Treatment: Rehabilitation  SUBJECTIVE:                                                                                                                                                                                             SUBJECTIVE STATEMENT: Patient arrives to session with brother who is primary household caregiver. Patient reports that she is still not using walker regularly in home. Family doe have narrow spaces but  were informed they could move things around to make more space and have agreed on numerous occasions that this would be possible. Patient has been working on sit to stands at home and Wilmon Pali reports that [place] reviewed freezing strategies with him last time.   Pt accompanied by: self and sister Wilmon Pali   PERTINENT HISTORY: PSP  PAIN:  Are you having pain? No  PRECAUTIONS: None  RED FLAGS: None   WEIGHT BEARING RESTRICTIONS: No  FALLS: Has patient fallen in last 6 months? Yes. Number of falls 3 - see above for details   LIVING ENVIRONMENT: Lives with: lives with their family - brother Lives in: House/apartment Stairs: Yes: External: 3 stairs steps; bilateral but cannot reach both - previously had a ramp but not at this time Has following equipment at home: Single point cane and Walker - 2 wheeled  PLOF:  Patient was last living independently about a year ago (9 months) and also stopped driving about 9 months   PATIENT GOALS: "To learn how to function."   OBJECTIVE:  Note: Objective measures were completed at Evaluation unless otherwise noted.  COGNITION: Overall cognitive status: Impaired - patient required frequent prompting from sister for correction on dates                                 TREATMENT :    Self Care: Majority of PT session spent on extensive caregiver and patient training on anticipated  planning for functional changes anticipated with PSP Family continues to state that goal is to keep patient home, PT encourages caregiver and patient to follow up with PCP about different options for home versus care places at upcoming visit in May, provide written reminder and caregiver verbalizes understanding Reiterate 100% use of 2WW in home Discussed that anticipate patient will likely need a wheelchair in the future, discusses options including transport chair, tilt in space, and power with attendant control pending on what patient is looking for/appropriateness with available caregiver support, and home/community access, brother reports that they would need to talk with [name] about her opinion, they currently have stairs into the home and PT with growing concern about entry/exit out of home, encouraged conversation about considering getting a ramp versus again needing to consider alternative long term placements in future if home access becomes challenging Provided PD resources and PSP specific community resources particularly for brother caregiver as he reports that [name] is primarily the one who has looked into disease progression, educated on importance of staying ahead and anticipating next stage in process  TherAct:  Reviewed as teach back from prior sessions   Freezing Strategies: Discussed freezing strategies for home with use of (STOP, SHIFT, SEE target (through available visual field), and STEP to target), patient able to verbalize all without challenge or prompting from PT, reports no freezing since last here More retropulsive with sit to stand tranfers in session, though initiates anterior weight shift, very difficult to maintain through full ROM (CGA-minA x 2)   PATIENT EDUCATION: Education details: See self care session above  Person educated: Patient and Caregiver sister Education method: Explanation, Demonstration, Verbal cues, and Handouts Education comprehension: verbalized  understanding, returned demonstration, verbal cues required, and needs further education  HOME EXERCISE PROGRAM: Access Code: [email or web address] URL: [email or web address] Date: 07/04/2023 Prepared by: Sherlie Ban  Exercises - Sit to Stand with Armchair  - 2 x daily - 7 x weekly - 1 sets - 10  reps   - Rolling both ways in bed a few times a day with caregiver supervision initially  - Bridge scoot in bed practice when going to bed 1x a day   GOALS: Goals reviewed with patient? Yes  LONG TERM GOALS: Target date: 08/12/2023 (STG = LTG due to POC length)  Patient and caregiver will demonstrate independence with final HEP in order to maintain current gains and continue to progress after physical therapy discharge.   Baseline: To be provided  Goal status: INITIAL  2.  Patient will improve their 5x Sit to Stand score to less than 49 seconds with minimal necessary UE use to demonstrate a decreased risk for falls and improved LE strength.   Baseline: 56.9 seconds with bilateral UE use/retropulsive (CGA) Goal status: INITIAL  3.  Patient will improve gait speed to 1.52 ft/sec with LRAD to indicate progress towards reduction in falls risk and meet MCID for meaningful change in gait speed.   Baseline: 1.19 ft/sec  Goal status: INITIAL  4.  Patient will improve TUG score to 45 seconds to indicate clinically significant progress towards a decreased risk of falls and improved mobility.  Baseline: 54 seconds with 2WW + CGA and mod verbal cues  Goal status: INITIAL  5. Family members will verbalize long term plan for patient to have safe entry in home as currently requires use of stairs with single handrail.  Baseline: Stairs with single rail - discussed ramped entry etc in future Goal status: INITIAL    ASSESSMENT:  CLINICAL IMPRESSION: Skilled PT session emphasized extensive caregiver education with patient present as brother present today. Notably, brother very eager to  help/supportive but does have moderate gait challenges/unsteadiness himself which may impact ability to help with transfers in the future without risk for injury. Continue to educate extensively on anticipated medical needs with disease progression including topics such as: appropriate AD, getting some form of W/C early, home access (currently stairs), caregiver support versus external facility, freezing strategies, and cues caregivers can provided to assist with transfers. Will continue per POC.   OBJECTIVE IMPAIRMENTS: Abnormal gait, decreased activity tolerance, decreased balance, decreased cognition, decreased knowledge of use of DME, difficulty walking, decreased strength, decreased safety awareness, and impaired vision/preception.   ACTIVITY LIMITATIONS: carrying, lifting, sitting, standing, squatting, transfers, reach over head, and locomotion level  PARTICIPATION LIMITATIONS: meal prep, cleaning, laundry, driving, community activity, and yard work  PERSONAL FACTORS: Age, Time since onset of injury/illness/exacerbation, and 1-2 comorbidities: see above  are also affecting patient's functional outcome.   REHAB POTENTIAL: Fair given progression of PSP  CLINICAL DECISION MAKING: Evolving/moderate complexity  EVALUATION COMPLEXITY: Moderate  PLAN:  PT FREQUENCY: 2x/week (dropeed to 1x week on 4/11, visits taken off schedule as appropriate)  PT DURATION: 4 weeks  PLANNED INTERVENTIONS: 97164- PT Re-evaluation, 97110-Therapeutic exercises, 97530- Therapeutic activity, 97112- Neuromuscular re-education, 97535- Self Care, 82956- Manual therapy, and 97116- Gait training  PLAN FOR NEXT SESSION: update HEP to make appropriate for current function, work on safety with 2WW with transfers, anterior weight shift with transfers, caregiver training for safety with visual freezing, long term plan for patient getting into and out of home (currently stairs), continue to work on bridge boost with bed  mobility for hip clearance, part practice trial for floor recovery on mat   Topics to review: care facility versus care aids, entrance into home (ramp - could look at stairs in session), transfer safety, consider transport chair for family to have - could do W/C eval but  may not be best for home setup  Carmelia Bake, PT, DPT 07/15/2023, 12:20 PM

## 2023-07-18 ENCOUNTER — Ambulatory Visit: Admitting: Physical Therapy

## 2023-07-18 DIAGNOSIS — R278 Other lack of coordination: Secondary | ICD-10-CM | POA: Diagnosis not present

## 2023-07-18 DIAGNOSIS — M6281 Muscle weakness (generalized): Secondary | ICD-10-CM

## 2023-07-18 DIAGNOSIS — R1310 Dysphagia, unspecified: Secondary | ICD-10-CM | POA: Diagnosis not present

## 2023-07-18 DIAGNOSIS — R2689 Other abnormalities of gait and mobility: Secondary | ICD-10-CM | POA: Diagnosis not present

## 2023-07-18 DIAGNOSIS — R293 Abnormal posture: Secondary | ICD-10-CM

## 2023-07-18 DIAGNOSIS — R2681 Unsteadiness on feet: Secondary | ICD-10-CM | POA: Diagnosis not present

## 2023-07-18 DIAGNOSIS — R471 Dysarthria and anarthria: Secondary | ICD-10-CM | POA: Diagnosis not present

## 2023-07-18 NOTE — Therapy (Signed)
 OUTPATIENT PHYSICAL THERAPY NEURO TREATMENT   Patient Name: April Larson [place] MRN: [id number] DOB:06-20-1950, 73 y.o., female Today's Date: 07/18/2023   PCP: Cleave Curling, MD REFERRING PROVIDER: Wess Hammed, NP  END OF SESSION:  PT End of Session - 07/18/23 1018     Visit Number 6    Number of Visits 9    Date for PT Re-Evaluation 08/12/23    Authorization Type [employer] Dual Complete    PT Start Time 1017    PT Stop Time 1059    PT Time Calculation (min) 42 min    Equipment Utilized During Treatment Gait belt    Activity Tolerance Patient tolerated treatment well    Behavior During Therapy WFL for tasks assessed/performed               Past Medical History:  Diagnosis Date   Diabetes mellitus without complication (HCC)    Hypertension    Parkinson disease (HCC)    Substance abuse (HCC)    Past Surgical History:  Procedure Laterality Date   EYE SURGERY     Patient Active Problem List   Diagnosis Date Noted   OSA on CPAP 05/17/2023   Anemia 04/03/2023   Supranuclear palsy (HCC) 04/03/2023   Atypical syncope 03/04/2023   Seizure disorder (HCC) 03/04/2023   Seizure-like activity (HCC) 03/02/2023   Thrombocytosis 12/06/2022   Gait abnormality 12/06/2022   Encounter for annual physical exam 11/24/2022   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   Decreased dorsalis pedis pulse 11/17/2020   Tachycardia 08/11/2020   Cough 08/11/2020   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   Pure hypercholesterolemia 01/23/2020   Vitamin D deficiency 10/23/2019   Right hip pain 05/15/2018   Estrogen deficiency 05/15/2018   Class 1 obesity due to excess calories with serious comorbidity and body mass index (BMI) of 31.0 to 31.9 in adult 05/15/2018   Diabetes mellitus with stage 2 chronic kidney disease (HCC) 01/11/2018   Benign hypertension with chronic kidney disease, stage II 01/11/2018    ONSET DATE: 06/20/2023 (referral date)   REFERRING DIAG:  G23.1 (ICD-10-CM) - Supranuclear palsy (HCC)  THERAPY DIAG:  Muscle weakness (generalized)  Unsteadiness on feet  Other lack of coordination  Abnormal posture  Rationale for Evaluation and Treatment: Rehabilitation  SUBJECTIVE:                                                                                                                                                                                             SUBJECTIVE STATEMENT: Patient arrives to session with sister, Marily Shows. Marily Shows reports pt is furniture walking at home but is using the RW  at all times outside the home. Pt is working on her exercises, still having difficulty w/sit to stands. No falls   Pt accompanied by: self and sister Marily Shows   PERTINENT HISTORY: PSP  PAIN:  Are you having pain? No  PRECAUTIONS: None  RED FLAGS: None   WEIGHT BEARING RESTRICTIONS: No  FALLS: Has patient fallen in last 6 months? Yes. Number of falls 3 - see above for details   LIVING ENVIRONMENT: Lives with: lives with their family - brother Lives in: House/apartment Stairs: Yes: External: 3 stairs steps; bilateral but cannot reach both - previously had a ramp but not at this time Has following equipment at home: Single point cane and Walker - 2 wheeled  PLOF:  Patient was last living independently about a year ago (9 months) and also stopped driving about 9 months   PATIENT GOALS: "To learn how to function."   OBJECTIVE:  Note: Objective measures were completed at Evaluation unless otherwise noted.  COGNITION: Overall cognitive status: Impaired - patient required frequent prompting from sister for correction on dates                                 TREATMENT :    Self Care: Reviewed education provided at previous session and emphasized importance of obtaining transport chair for assistance getting to appointments and general safety at home. Showed Marily Shows where she can purchase one, but conversation notably bothered pt so  encouraged pt and sister to think about it and return next week w/questions. Pt and sister verbalized understanding.   NMR  Reviewed sit to stands and pt continues to demonstrate significant retropulsion w/improper foot placement and premature knee extension. Provided tactile and verbal cues for proper body mechanics (foot placement, wider BOS, anterior weight shift) and had pt perform 10-12 reps w/emphasis on proper form. Pt required min-mod A each rep to facilitate anterior weight shift and prevent posterior fall onto mat. Also noted poor eccentric control w/sit. Encouraged pt to keep working on this at home.  Updated HEP (see bolded below) to add simple quad strengthening, as pt reports feeling weaker on RLE > LLE. Added LAQ w/2-3s isometric hold. Provided printout but pt has difficulty reading it. Pt was able to read print of HEP after several minutes of focusing.   STAIRS:  Level of Assistance: CGA Stair Negotiation Technique: Step to Pattern with Bilateral Rails  Number of Stairs: 4   Height of Stairs: 6  Comments: Assessed safety on stairs as pt is navigating 3 at home currently. Pt demonstrated good foot placement on step if taking her time. No major instability noted.    PATIENT EDUCATION: Education details: See self-care section above, additions to HEP  Person educated: Patient and Caregiver sister Education method: Explanation, Demonstration, Tactile cues, Verbal cues, and Handouts Education comprehension: verbalized understanding, returned demonstration, verbal cues required, tactile cues required, and needs further education  HOME EXERCISE PROGRAM: Access Code: [email or web address] URL: [email or web address] Date: 07/04/2023 Prepared by: Jonathan Neighbor  Exercises - Sit to Stand with Armchair  - 2 x daily - 7 x weekly - 1 sets - 10 reps  - Seated knee extension   - 1 x daily - 7 x weekly - 3 sets - 10 reps - 2-3 seconds hold  - Rolling both ways in bed a few times a day  with caregiver supervision initially  - Bridge scoot in bed practice when going to bed  1x a day   GOALS: Goals reviewed with patient? Yes  LONG TERM GOALS: Target date: 08/12/2023 (STG = LTG due to POC length)  Patient and caregiver will demonstrate independence with final HEP in order to maintain current gains and continue to progress after physical therapy discharge.   Baseline: To be provided  Goal status: INITIAL  2.  Patient will improve their 5x Sit to Stand score to less than 49 seconds with minimal necessary UE use to demonstrate a decreased risk for falls and improved LE strength.   Baseline: 56.9 seconds with bilateral UE use/retropulsive (CGA) Goal status: INITIAL  3.  Patient will improve gait speed to 1.52 ft/sec with LRAD to indicate progress towards reduction in falls risk and meet MCID for meaningful change in gait speed.   Baseline: 1.19 ft/sec  Goal status: INITIAL  4.  Patient will improve TUG score to 45 seconds to indicate clinically significant progress towards a decreased risk of falls and improved mobility.  Baseline: 54 seconds with 2WW + CGA and mod verbal cues  Goal status: INITIAL  5. Family members will verbalize long term plan for patient to have safe entry in home as currently requires use of stairs with single handrail.  Baseline: Stairs with single rail - discussed ramped entry etc in future Goal status: INITIAL    ASSESSMENT:  CLINICAL IMPRESSION: Emphasis of skilled PT session on pt and family education, safety w/transfers and BLE strength. Encouraged pt and sister to look into obtaining a transport chair soon as this could be a safer mode of transportation for pt. Pt continues to demonstrate significant retropulsion w/ transfers despite multimodal cues, so encouraged pt to continue practicing proper technique at home (pt has poor retention w/this). Added simple LAQ to HEP for improved quad strength which pt able to demonstrate well. Continue POC.    OBJECTIVE IMPAIRMENTS: Abnormal gait, decreased activity tolerance, decreased balance, decreased cognition, decreased knowledge of use of DME, difficulty walking, decreased strength, decreased safety awareness, and impaired vision/preception.   ACTIVITY LIMITATIONS: carrying, lifting, sitting, standing, squatting, transfers, reach over head, and locomotion level  PARTICIPATION LIMITATIONS: meal prep, cleaning, laundry, driving, community activity, and yard work  PERSONAL FACTORS: Age, Time since onset of injury/illness/exacerbation, and 1-2 comorbidities: see above  are also affecting patient's functional outcome.   REHAB POTENTIAL: Fair given progression of PSP  CLINICAL DECISION MAKING: Evolving/moderate complexity  EVALUATION COMPLEXITY: Moderate  PLAN:  PT FREQUENCY: 2x/week (dropeed to 1x week on 4/11, visits taken off schedule as appropriate)  PT DURATION: 4 weeks  PLANNED INTERVENTIONS: 97164- PT Re-evaluation, 97110-Therapeutic exercises, 97530- Therapeutic activity, 97112- Neuromuscular re-education, 97535- Self Care, 16109- Manual therapy, and 97116- Gait training  PLAN FOR NEXT SESSION: Goals and DC. Set up PD screens for 3 mo? Review sit to stands and address questions about transport chair if necessary   Andie Mungin E Keean Wilmeth, PT, DPT 07/18/2023, 11:00 AM

## 2023-07-21 ENCOUNTER — Ambulatory Visit: Admitting: Physical Therapy

## 2023-07-25 ENCOUNTER — Ambulatory Visit: Admitting: Physical Therapy

## 2023-07-25 ENCOUNTER — Other Ambulatory Visit (HOSPITAL_COMMUNITY): Payer: Self-pay

## 2023-07-25 ENCOUNTER — Other Ambulatory Visit: Payer: Self-pay | Admitting: Internal Medicine

## 2023-07-25 ENCOUNTER — Encounter: Payer: Self-pay | Admitting: Physical Therapy

## 2023-07-25 ENCOUNTER — Other Ambulatory Visit: Payer: Self-pay

## 2023-07-25 DIAGNOSIS — R2681 Unsteadiness on feet: Secondary | ICD-10-CM

## 2023-07-25 DIAGNOSIS — R2689 Other abnormalities of gait and mobility: Secondary | ICD-10-CM

## 2023-07-25 DIAGNOSIS — M6281 Muscle weakness (generalized): Secondary | ICD-10-CM | POA: Diagnosis not present

## 2023-07-25 DIAGNOSIS — R293 Abnormal posture: Secondary | ICD-10-CM

## 2023-07-25 DIAGNOSIS — R471 Dysarthria and anarthria: Secondary | ICD-10-CM | POA: Diagnosis not present

## 2023-07-25 DIAGNOSIS — R1310 Dysphagia, unspecified: Secondary | ICD-10-CM | POA: Diagnosis not present

## 2023-07-25 DIAGNOSIS — R278 Other lack of coordination: Secondary | ICD-10-CM | POA: Diagnosis not present

## 2023-07-25 MED FILL — Telmisartan-Hydrochlorothiazide Tab 40-12.5 MG: ORAL | 30 days supply | Qty: 30 | Fill #5 | Status: CN

## 2023-07-25 MED FILL — Omeprazole Cap Delayed Release 40 MG: ORAL | 90 days supply | Qty: 90 | Fill #0 | Status: CN

## 2023-07-25 MED FILL — Atorvastatin Calcium Tab 10 MG (Base Equivalent): ORAL | 30 days supply | Qty: 30 | Fill #4 | Status: CN

## 2023-07-25 NOTE — Therapy (Signed)
 OUTPATIENT PHYSICAL THERAPY NEURO TREATMENT/DISCHARGE SUMMARY   Patient Name: April Larson MRN: [id number] DOB:[birth year], 73 y.o., female Today's Date: 07/25/2023   PCP: Cleave Curling, MD REFERRING PROVIDER: Wess Hammed, NP  END OF SESSION:  PT End of Session - 07/25/23 1317     Visit Number 7    Number of Visits 9    Date for PT Re-Evaluation 08/12/23    Authorization Type [employer] Dual Complete    PT Start Time 1315    PT Stop Time 1353   full time not used due to D/C visit   PT Time Calculation (min) 38 min    Equipment Utilized During Treatment Gait belt    Activity Tolerance Patient tolerated treatment well    Behavior During Therapy WFL for tasks assessed/performed               Past Medical History:  Diagnosis Date   Diabetes mellitus without complication (HCC)    Hypertension    Parkinson disease (HCC)    Substance abuse (HCC)    Past Surgical History:  Procedure Laterality Date   EYE SURGERY     Patient Active Problem List   Diagnosis Date Noted   OSA on CPAP 05/17/2023   Anemia 04/03/2023   Supranuclear palsy (HCC) 04/03/2023   Atypical syncope 03/04/2023   Seizure disorder (HCC) 03/04/2023   Seizure-like activity (HCC) 03/02/2023   Thrombocytosis 12/06/2022   Gait abnormality 12/06/2022   Encounter for annual physical exam 11/24/2022   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   Decreased dorsalis pedis pulse 11/17/2020   Tachycardia 08/11/2020   Cough 08/11/2020   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   Pure hypercholesterolemia 01/23/2020   Vitamin D  deficiency 10/23/2019   Right hip pain 05/15/2018   Estrogen deficiency 05/15/2018   Class 1 obesity due to excess calories with serious comorbidity and body mass index (BMI) of 31.0 to 31.9 in adult 05/15/2018   Diabetes mellitus with stage 2 chronic kidney disease (HCC) 01/11/2018   Benign hypertension with chronic kidney disease, stage II 01/11/2018    ONSET  DATE: 06/20/2023 (referral date)   REFERRING DIAG: G23.1 (ICD-10-CM) - Supranuclear palsy (HCC)  THERAPY DIAG:  Muscle weakness (generalized)  Unsteadiness on feet  Abnormal posture  Other abnormalities of gait and mobility  Rationale for Evaluation and Treatment: Rehabilitation  SUBJECTIVE:                                                                                                                                                                                             SUBJECTIVE STATEMENT: No falls. Pt reporting using her RW all  the time out in the community.   Pt accompanied by: self and sister Marily Shows   PERTINENT HISTORY: PSP  PAIN:  Are you having pain? No  PRECAUTIONS: None  RED FLAGS: None   WEIGHT BEARING RESTRICTIONS: No  FALLS: Has patient fallen in last 6 months? Yes. Number of falls 3 - see above for details   LIVING ENVIRONMENT: Lives with: lives with their family - brother Lives in: House/apartment Stairs: Yes: External: 3 stairs steps; bilateral but cannot reach both - previously had a ramp but not at this time Has following equipment at home: Single point cane and Walker - 2 wheeled  PLOF:  Patient was last living independently about a year ago (9 months) and also stopped driving about 9 months   PATIENT GOALS: "To learn how to function."   OBJECTIVE:  Note: Objective measures were completed at Evaluation unless otherwise noted.  COGNITION: Overall cognitive status: Impaired - patient required frequent prompting from sister for correction on dates                                 TREATMENT :    Self Care: Reviewed education provided at previous session and emphasized importance of obtaining transport chair for assistance getting to appointments and general safety at home. Pt and pt's sister with no questions at this time. Marily Shows reports that she has the information on where she can purchase one   Pt's sister reports that they will eventually  be getting a ramp installed, discussed importance of being proactive with this and not waiting until pt can no longer do stairs before they think about installing a ramp  Educated on importance of continuing to perform HEP and stay moving at home, when working on sit <> stands would rather have pt do less reps of quality sit <> stands and taking time on technique rather than just doing incr reps  Pt has OT and speech therapy throughout the end of May, discussed will have pt scheduled for PD screens in 4 months, pt and pt's sister in agreement with this plan   Therapeutic Activity: Goal Assessment: 5x sit <> stand: 1 minute and 10 seconds with decr anterior weight shift with BUE support and significant retropulsion TUG: 44.9 seconds with RW with CGA and mod verbal cues for when to turn and proper sit <> stand position before standing  Gait speed: 21.7 seconds with RW = 1.51 ft/sec  Reviewed sit to stands as pt continues to demonstrate significant retropulsion w/improper foot placement and decr anterior weight shift. Provided tactile and verbal cues for proper body mechanics (foot placement, wider BOS, anterior weight shift). Had pt perform approx 20 reps during session with focus on proper form. Pt unable to see foot position, but able to respond to cues to step feet back. When pt focused on stepping feet back was able to perform with better technique and less retropulsion. When pt standing without getting into proper position, needing min A for balance and for eccentric lowering back down to chair. When performing a good stand, pt then able to use BUE support on arm rests to eccentrically lower back into chair. Encouraged pt to keep working on this at home.   PATIENT EDUCATION: Education details: See self-care section above, additions to HEP  Person educated: Patient and Caregiver sister Education method: Explanation, Demonstration, Tactile cues, Verbal cues, and Handouts Education comprehension:  verbalized understanding, returned demonstration, verbal  cues required, tactile cues required, and needs further education  HOME EXERCISE PROGRAM: Access Code: [email or web address] URL: [email or web address] Date: 07/04/2023 Prepared by: Jonathan Neighbor  Exercises - Sit to Stand with Armchair  - 2 x daily - 7 x weekly - 1 sets - 10 reps  - Seated knee extension   - 1 x daily - 7 x weekly - 3 sets - 10 reps - 2-3 seconds hold  - Rolling both ways in bed a few times a day with caregiver supervision initially  - Bridge scoot in bed practice when going to bed 1x a day    PHYSICAL THERAPY DISCHARGE SUMMARY  Visits from Start of Care: 7  Current functional level related to goals / functional outcomes: See LTGs/Clinical Assessment Statement   Remaining deficits: PSP deficits, impaired cognition, impaired vision, gait abnormalities, impaired balance, difficulties with sit <> stands with retropulsion    Education / Equipment: HEP, fall prevention, sit <> stand technique, anticipated medical needs with PSP disease progression, using RW for gait   Patient agrees to discharge. Patient goals were partially met. Patient is being discharged due to maximized rehab potential at this time.  With plan to follow up with PD screens in 4 months    GOALS: Goals reviewed with patient? Yes  LONG TERM GOALS: Target date: 08/12/2023 (STG = LTG due to POC length)  Patient and caregiver will demonstrate independence with final HEP in order to maintain current gains and continue to progress after physical therapy discharge.   Baseline: HEP provided with pt and family education, pt needs to be consistent with doing at home  Goal status: PARTIALLY MET  2.  Patient will improve their 5x Sit to Stand score to less than 49 seconds with minimal necessary UE use to demonstrate a decreased risk for falls and improved LE strength.   Baseline: 56.9 seconds with bilateral UE use/retropulsive (CGA)  1 minute and  10 seconds with decr anterior weight shift with BUE support and significant retropulsion Goal status: NOT MET  3.  Patient will improve gait speed to 1.52 ft/sec with LRAD to indicate progress towards reduction in falls risk and meet MCID for meaningful change in gait speed.   Baseline: 1.19 ft/sec   21.7 seconds with RW = 1.51 ft/sec Goal status: MET  4.  Patient will improve TUG score to 45 seconds to indicate clinically significant progress towards a decreased risk of falls and improved mobility.  Baseline: 54 seconds with 2WW + CGA and mod verbal cues   44.9 seconds with RW with CGA (4/21) Goal status: MET  5. Family members will verbalize long term plan for patient to have safe entry in home as currently requires use of stairs with single handrail.  Baseline: Stairs with single rail - discussed ramped entry etc in future  Pt's sister reports that they will eventually be getting a ramp installed Goal status: PARTIALLY MET    ASSESSMENT:  CLINICAL IMPRESSION: Today's skilled session focused on assessing pt's LTGs, continued family education, and safety with transfers. Continued to work on sit <> stand technique to decr retropulsion (has been covered extensively in sessions) with pt still needing multi-modal cues for proper technique. Encouraged pt to continue to perform with family at home. Pt met LTGs #3 and #4 in regards to TUG and gait speed. Pt partially met LTGs #1 and #5 in regards to HEP and long term plans. Reviewed importance of having a ramped entry in the future and looking into a transport chair.  Pt did not meet LTG #2 in regards to sit <> stands. Discussed plan to D/C at this time due to pt meeting her max potential with PT at this time and education provided to pt's family and pt. Pt's sister and pt in agreement with plan. Will have pt schedule PD screens in 4 months.   OBJECTIVE IMPAIRMENTS: Abnormal gait, decreased activity tolerance, decreased balance, decreased  cognition, decreased knowledge of use of DME, difficulty walking, decreased strength, decreased safety awareness, and impaired vision/preception.   ACTIVITY LIMITATIONS: carrying, lifting, sitting, standing, squatting, transfers, reach over head, and locomotion level  PARTICIPATION LIMITATIONS: meal prep, cleaning, laundry, driving, community activity, and yard work  PERSONAL FACTORS: Age, Time since onset of injury/illness/exacerbation, and 1-2 comorbidities: see above  are also affecting patient's functional outcome.   REHAB POTENTIAL: Fair given progression of PSP  CLINICAL DECISION MAKING: Evolving/moderate complexity  EVALUATION COMPLEXITY: Moderate  PLAN:  PT FREQUENCY: 2x/week (dropeed to 1x week on 4/11, visits taken off schedule as appropriate)  PT DURATION: 4 weeks  PLANNED INTERVENTIONS: 97164- PT Re-evaluation, 97110-Therapeutic exercises, 97530- Therapeutic activity, [id number]- Neuromuscular re-education, 97535- Self Care, 45409- Manual therapy, and 97116- Gait training  PLAN FOR NEXT SESSION:D/C  [place], PT, DPT 07/25/2023, 2:05 PM

## 2023-07-29 ENCOUNTER — Ambulatory Visit: Admitting: Physical Therapy

## 2023-07-30 ENCOUNTER — Other Ambulatory Visit: Payer: Self-pay | Admitting: Internal Medicine

## 2023-08-01 ENCOUNTER — Ambulatory Visit: Admitting: Occupational Therapy

## 2023-08-01 ENCOUNTER — Encounter: Payer: Self-pay | Admitting: Occupational Therapy

## 2023-08-01 ENCOUNTER — Ambulatory Visit

## 2023-08-01 DIAGNOSIS — R41841 Cognitive communication deficit: Secondary | ICD-10-CM

## 2023-08-01 DIAGNOSIS — R1310 Dysphagia, unspecified: Secondary | ICD-10-CM

## 2023-08-01 DIAGNOSIS — R41842 Visuospatial deficit: Secondary | ICD-10-CM

## 2023-08-01 DIAGNOSIS — R2689 Other abnormalities of gait and mobility: Secondary | ICD-10-CM | POA: Diagnosis not present

## 2023-08-01 DIAGNOSIS — R2681 Unsteadiness on feet: Secondary | ICD-10-CM | POA: Diagnosis not present

## 2023-08-01 DIAGNOSIS — R471 Dysarthria and anarthria: Secondary | ICD-10-CM

## 2023-08-01 DIAGNOSIS — R41840 Attention and concentration deficit: Secondary | ICD-10-CM

## 2023-08-01 DIAGNOSIS — M6281 Muscle weakness (generalized): Secondary | ICD-10-CM | POA: Diagnosis not present

## 2023-08-01 DIAGNOSIS — R293 Abnormal posture: Secondary | ICD-10-CM | POA: Diagnosis not present

## 2023-08-01 DIAGNOSIS — R278 Other lack of coordination: Secondary | ICD-10-CM

## 2023-08-01 NOTE — Therapy (Signed)
 OUTPATIENT OCCUPATIONAL THERAPY NEURO TREATMENT  Patient Name: April Larson [place] MRN: [id number] DOB:[birth year], 73 y.o., female Today's Date: 08/01/2023  PCP: Cleave Curling, MD  REFERRING PROVIDER: Wess Hammed, NP   END OF SESSION:  OT End of Session - 08/01/23 1242     Visit Number 2    Number of Visits 9    Date for OT Re-Evaluation 08/12/23    Authorization Type [employer] Dual, no auth required    OT Start Time 1233    OT Stop Time 1315    OT Time Calculation (min) 42 min    Activity Tolerance Patient tolerated treatment well    Behavior During Therapy WFL for tasks assessed/performed             Past Medical History:  Diagnosis Date   Diabetes mellitus without complication (HCC)    Hypertension    Parkinson disease (HCC)    Substance abuse (HCC)    Past Surgical History:  Procedure Laterality Date   EYE SURGERY     Patient Active Problem List   Diagnosis Date Noted   OSA on CPAP 05/17/2023   Anemia 04/03/2023   Supranuclear palsy (HCC) 04/03/2023   Atypical syncope 03/04/2023   Seizure disorder (HCC) 03/04/2023   Seizure-like activity (HCC) 03/02/2023   Thrombocytosis 12/06/2022   Gait abnormality 12/06/2022   Encounter for annual physical exam 11/24/2022   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   Decreased dorsalis pedis pulse 11/17/2020   Tachycardia 08/11/2020   Cough 08/11/2020   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   Pure hypercholesterolemia 01/23/2020   Vitamin D  deficiency 10/23/2019   Right hip pain 05/15/2018   Estrogen deficiency 05/15/2018   Class 1 obesity due to excess calories with serious comorbidity and body mass index (BMI) of 31.0 to 31.9 in adult 05/15/2018   Diabetes mellitus with stage 2 chronic kidney disease (HCC) 01/11/2018   Benign hypertension with chronic kidney disease, stage II 01/11/2018    ONSET DATE: 07/04/2023 (referral date)  REFERRING DIAG:  R47.1 (ICD-10-CM) - Dysarthria and anarthria  R41.841  (ICD-10-CM) - Cognitive communication deficit  R13.10 (ICD-10-CM) - Dysphagia, unspecified type  G20.C (ICD-10-CM) - Primary parkinsonism (HCC)  R41.842 (ICD-10-CM) - Visuospatial deficit  R29.3 (ICD-10-CM) - Abnormal posture  R41.840 (ICD-10-CM) - Attention and concentration deficit  M62.81 (ICD-10-CM) - Muscle weakness (generalized)   07/04/23 Referral Notes: PSP   THERAPY DIAG:  Other lack of coordination  Unsteadiness on feet  Muscle weakness (generalized)  Visuospatial deficit  Attention and concentration deficit  Rationale for Evaluation and Treatment: Rehabilitation  SUBJECTIVE:   SUBJECTIVE STATEMENT: No pain. No new falls since last seen  Pt's sister elaborated that pt is having difficulty with vision and picking up objects with hands, pt is demo'ing cognition concerns, and pt seems unaware of need for therapy. Per pt's sister, pt is "very strong-willed."   Pt's sister reported pt last came to clinic about 2 years ago for same dx, and the dx "has progressed considerably." Pt had HH nursing and therapy for several months but "the structure was not there."  Pt accompanied by: self and sister Marily Shows)  PERTINENT HISTORY: PSP, OSA on CPAP, HTN, DM, hx of seizure/seizure-like activity, hx of substance abuse  PRECAUTIONS: Fall, no driving, To reduce fall risk: SBA or CGA when walking, recommended to stabilize chair when pt completes sit-to-stand/stand-to-sit transitions d/t tendency for retropulsion. Recommended increased v/c secondary visual deficits.  WEIGHT BEARING RESTRICTIONS: No  PAIN:  Are you having pain? No  FALLS:  Has patient fallen in last 6 months? Yes. Number of falls 5-6 falls, 1 fall occurred day before yesterday - pt fell back when walking d/t focusing on something else     LIVING ENVIRONMENT: Lives with: lives with their family - brother Lives in: House/apartment Stairs: Yes: External: 3 stairs steps; bilateral but cannot reach both - previously had a  ramp but not at this time Has following equipment at home: Single point cane and Walker - 2 wheeled  PLOF: Patient was last living independently about a year ago (9 months) and also stopped driving about 9 months   PATIENT GOALS: "to be able to pick up stuff"  OBJECTIVE:  Note: Objective measures were completed at Evaluation unless otherwise noted.  HAND DOMINANCE: Right  ADLs: Overall ADLs:  Transfers/ambulation related to ADLs: Eating: difficulty with swallowing, supposed to eat small portions, speeds up when eating Grooming: difficulty with toothbrushing - standard toothbrush UB Dressing: ind with extra time LB Dressing: unable to look down therefore difficult, assistance from family Toileting: ind Bathing: ind for completing washing tasks, shower chair, grab bars - family direct supervision Tub Shower transfers: walk-in shower Equipment: Shower seat with back, Grab bars, and Walk in shower  IADLs: dependent  MOBILITY STATUS: Hx of falls and 2-wheeled walker, "I do freeze"   POSTURE COMMENTS:  Sitting ind  ACTIVITY TOLERANCE: Activity tolerance: Pt reported feeling more tired throughout the day.  FUNCTIONAL OUTCOME MEASURES: PSFS: 1.3    Total score = sum of the activity scores/number of activities Minimum detectable change (90%CI) for average score = 2 points Minimum detectable change (90%CI) for single activity score = 3 points   UPPER EXTREMITY ROM:    Active ROM Right eval Left eval  Shoulder flexion Approx. 90* Approx. 90*  Shoulder abduction    Shoulder adduction    Shoulder extension    Shoulder internal rotation Pt able to reach behind head though pt reported tightness in shoulders, RUE tightness >LUE tightness Pt able to reach behind head though pt reported tightness in shoulders, RUE tightness >LUE tightness  Shoulder external rotation    Elbow flexion [facility] WFL  Elbow extension WFL, v/c for full ext [facility]  Wrist flexion [facility] WFL  Wrist extension [facility]  WFL  Wrist ulnar deviation    Wrist radial deviation    Wrist pronation [facility] WFL  Wrist supination WFL WFL  (Blank rows = not tested)  BUE digit flex/ext - WFL, additional v/c for full ext of thumbs  UPPER EXTREMITY MMT:     MMT Right eval Left eval  Shoulder flexion    Shoulder abduction    Shoulder adduction    Shoulder extension    Shoulder internal rotation    Shoulder external rotation    Middle trapezius    Lower trapezius    Elbow flexion    Elbow extension    Wrist flexion    Wrist extension    Wrist ulnar deviation    Wrist radial deviation    Wrist pronation    Wrist supination    (Blank rows = not tested)  HAND FUNCTION: Grip strength: Right: 28.6, 26.6, 20.7 (25.3 lbs average) lbs; Left: 28.6, 27.5, 25.7 (27.3 lbs average)  lbs  COORDINATION: Box and Blocks:  Right 18blocks, Left 26blocks  SENSATION: Pt denied numbness/tingling  EDEMA: None noted  MUSCLE TONE: RUE: Within functional limits and LUE: Within functional limits  COGNITION: Overall cognitive status: Impaired Decreased insight into deficits and decreased safety awareness based on observation  and per pt's sister report.   VISION: Subjective report: Pt's sister reported pt frequently looks up, fixed gaze and no peripheral vision.  Baseline vision: Wears glasses all the time for distance vision.  Visual history:  None noted , had corrective cataracts surgery both eyes  VISION ASSESSMENT: OT noted pt demo'd reduced eye blinking and often demo'd fixed upward gaze. Pt sometimes changed direction of fixed gaze towards a task or towards a person if another person was talking. However, pt generally unable to focus directly on task or directly on person talking.   PERCEPTION: Not tested  PRAXIS: Not tested  OBSERVATIONS: Pt was pleasant. Pt accompanied by family member. Pt demo'd fixed upward gaze though able to sometimes deviate gaze during conversation to attempt eye contact or to partially  look at task. Pt demo'd decreased safety awareness and decreased insight into deficits. Pt benefited from SBA to CGA and gait belt during ambulation with 2WW to reduce fall risk. Pt benefited from v/c to keep walker close to pt and to keep walker on ground. Pt benefited from continuous v/c to navigate environment and to complete sit-to-stand/stand-to-sit transfers. Pt benefited from OT stabilizing chair when completing sit-to-stand/stand-to-sit transfers d/t pt demo'ing retropulsion when sitting down.                                                                                                                            TREATMENT DATE: 08/01/23   Worked on safety with sit to stand transfers and cued to scoot to front of chair, tuck chin/head down (to compensate for decreased visual ROM), slide feet back, and stay forward over toes to prevent tendency to fall backwards  Discussed with pt/family strategies for eating to prevent choking as pt tends to get more food in mouth prior to fully chewing and swallowing first bite - see pt instructions for details. (Chin tuck not only to prevent choking but also to lower head to see better)  Recommended electric toothbrush for brushing bottom teeth (top row is dentures)  Worked on visual scanning for larger to smaller objects on table with cues to tuck chin/head down - pt tended to use Lt hand automatically even though she is Rt handed (suspect learned non use) therefore forced more use of Rt hand for SAFE activities - instructed pt/sister to do the same at home. Also recommended table games Photographer, memory games) on table to encourage more inferior gaze vs computer games.   Sister reports her friend is going to help adjust pt's phone for greater ease, safety and to prevent pt from being on phone too long  At end of session, recommended pt focus more on pattern of floor (as much as able) to also negotiate obstacles and prevent falls.   Reinforced need to sit  for showering/bathing and LE dressing. Pt is already sitting for LE dressing. Also reinforced P.T. recommendation of transport chair for community and coming to appts, etc to prevent falls.  PATIENT EDUCATION: Education details: see today's tx above Person educated: Patient and family member Education method: Explanation Education comprehension: verbalized understanding  HOME EXERCISE PROGRAM: 08/01/23: eating strategies, visual scanning activities on table  GOALS: Goals reviewed with patient? Yes  SHORT TERM GOALS: Target date: 07/25/23  Pt and caregiver will return demo of BUE HEP using visual handouts and no more than min v/c. Baseline: Pt and family reported pt does not have HEP from previous OT POC a few years ago. Goal status: INITIAL  2.  Pt and caregiver will verbalize understanding of A/E and adaptive strategies for navigating phone safely.  Baseline: Pt and family reported difficulty with navigating phone. Pt's family expressed concerns that pt has too many apps on phone. Goal status: INITIAL  3.  Pt and caregiver will verbalize understanding of vision compensation strategies and environmental adaptations to promote safety in home environment for navigation during ambulation.  Baseline: Visual deficits, decreased insights into deficits Goal status: INITIAL   LONG TERM GOALS: Target date: 08/12/23  Pt will maintain BUE grip strength as needed to maintain grasp of toothbrush and eating utensils. Baseline: Grip strength: Right: 28.6, 26.6, 20.7 (25.3 lbs average) lbs; Left: 28.6, 27.5, 25.7 (27.3 lbs average)  lbs Goal status: INITIAL  2.  Pt will maintain FM coordination as evidenced by maintaining box&blocks score of BUE. Baseline: Box and Blocks:  Right 18blocks, Left 26blocks Goal status: INITIAL  3.  Pt and caregiver will verbalize understanding of adaptive strategies and A/E options to promote safety and decrease fall risk during ADL tasks. Baseline: Pt demo'd  decreased insight into deficits, decreased safety awareness. Pt and family reported difficulty with holding toothbrush, completing LB dressing, picking up small items, controlling speed of eating.  Goal status: INITIAL  4.  Patient will report at least two-point increase in average PSFS score or at least three-point increase in a single activity score indicating functionally significant improvement given minimum detectable change using A/E and adaptive strategies PRN. Baseline: PSFS: 1.3 total score (See above for individual activity scores)  Goal status: INITIAL  ASSESSMENT:  CLINICAL IMPRESSION: Patient seen today for first occupational therapy treatment (eval on 07/11/23) for PSP and associated symptoms. Hx includes PSP, OSA on CPAP, HTN, DM, hx of seizure/seizure-like activity, hx of substance abuse. Patient currently presents below baseline level of functioning demonstrating functional deficits and impairments as noted below. The patient requires the skill of OT to help with ongoing exercise and mobility to prevent rapid and significant decline in functional mobility, strength, and ADL function. Pt and caregivers would also benefit from education regarding safety, A/E, and adaptive strategies in the home environment. Therefore, skilled maintenance OT is recommended for this patient.  PERFORMANCE DEFICITS: in functional skills including ADLs, IADLs, coordination, dexterity, proprioception, sensation, ROM, strength, flexibility, Fine motor control, Gross motor control, mobility, balance, body mechanics, endurance, decreased knowledge of precautions, decreased knowledge of use of DME, vision, and UE functional use, cognitive skills including attention, energy/drive, learn, memory, problem solving, safety awareness, and sequencing, and psychosocial skills including environmental adaptation.   IMPAIRMENTS: are limiting patient from ADLs, IADLs, rest and sleep, leisure, and social participation.    CO-MORBIDITIES: has co-morbidities such as PSP  that affects occupational performance. Patient will benefit from skilled OT to address above impairments and improve overall function.  MODIFICATION OR ASSISTANCE TO COMPLETE EVALUATION: Maximum or significant modification of tasks or assist is necessary to complete an evaluation.  OT OCCUPATIONAL PROFILE AND HISTORY: Detailed assessment: Review of records and  additional review of physical, cognitive, psychosocial history related to current functional performance.  CLINICAL DECISION MAKING: High - multiple treatment options, significant modification of task necessary  REHAB POTENTIAL: Fair d/t progression of PSP  EVALUATION COMPLEXITY: High    PLAN:  OT FREQUENCY: 2x/week  OT DURATION: 4 weeks  PLANNED INTERVENTIONS: 97168 OT Re-evaluation, 97535 self care/ADL training, 21308 therapeutic exercise, 97530 therapeutic activity, 97112 neuromuscular re-education, 97140 manual therapy, 97035 ultrasound, 97018 paraffin, 65784 fluidotherapy, 97010 moist heat, 97010 cryotherapy, 97760 Orthotics management and training, 69629 Splinting (initial encounter), [id number] Subsequent splinting/medication, passive range of motion, functional mobility training, visual/perceptual remediation/compensation, energy conservation, patient/family education, and DME and/or AE instructions  RECOMMENDED OTHER SERVICES: PT eval already completed. SLP referral placed.  CONSULTED AND AGREED WITH PLAN OF CARE: Patient  PLAN FOR NEXT SESSION:  Follow up on eating strategies and other recommendations HEP - FM coordination (limit to 1-3 exercises), Strengthening (limit 1-3 exercises - e.g. wringing towel) Visual scanning exercises   Velinda Getting, OT 08/01/2023, 12:51 PM

## 2023-08-01 NOTE — Therapy (Signed)
 OUTPATIENT SPEECH LANGUAGE PATHOLOGY EVALUATION   Patient Name: April Larson MRN: [id number] DOB:[birth year], 73 y.o., female Today's Date: 08/01/2023  PCP: Cleave Curling MD REFERRING PROVIDER: Wess Hammed, NP  END OF SESSION:  End of Session - 08/01/23 1143     Visit Number 1    Number of Visits 13    Date for SLP Re-Evaluation 09/26/23   extended for scheduling   Authorization Type [employer] - auth requested today via email    SLP Start Time 1145    SLP Stop Time  1230    SLP Time Calculation (min) 45 min    Activity Tolerance Patient tolerated treatment well             Past Medical History:  Diagnosis Date   Diabetes mellitus without complication (HCC)    Hypertension    Parkinson disease (HCC)    Substance abuse (HCC)    Past Surgical History:  Procedure Laterality Date   EYE SURGERY     Patient Active Problem List   Diagnosis Date Noted   OSA on CPAP 05/17/2023   Anemia 04/03/2023   Supranuclear palsy (HCC) 04/03/2023   Atypical syncope 03/04/2023   Seizure disorder (HCC) 03/04/2023   Seizure-like activity (HCC) 03/02/2023   Thrombocytosis 12/06/2022   Gait abnormality 12/06/2022   Encounter for annual physical exam 11/24/2022   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   Decreased dorsalis pedis pulse 11/17/2020   Tachycardia 08/11/2020   Cough 08/11/2020   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   Pure hypercholesterolemia 01/23/2020   Vitamin D  deficiency 10/23/2019   Right hip pain 05/15/2018   Estrogen deficiency 05/15/2018   Class 1 obesity due to excess calories with serious comorbidity and body mass index (BMI) of 31.0 to 31.9 in adult 05/15/2018   Diabetes mellitus with stage 2 chronic kidney disease (HCC) 01/11/2018   Benign hypertension with chronic kidney disease, stage II 01/11/2018    ONSET DATE: 07/04/2023 (referral date)   REFERRING DIAG: R47.1 (ICD-10-CM) - Dysarthria and anarthria R13.10 (ICD-10-CM) -  Dysphagia, unspecified type G20.C (ICD-10-CM) - Primary parkinsonism (HCC) R41.842 (ICD-10-CM) - Visuospatial deficit  THERAPY DIAG:  Dysphagia, unspecified type  Cognitive communication deficit  Dysarthria and anarthria  Rationale for Evaluation and Treatment: Rehabilitation  SUBJECTIVE:   SUBJECTIVE STATEMENT: "I can't see that well"  Pt accompanied by: family member  PERTINENT HISTORY: PSP, OSA on CPAP, HTN, DM, hx of seizure/seizure-like activity, hx of substance abuse   PAIN: Are you having pain? No  FALLS: Has patient fallen in last 6 months?  Yes, See PT evaluation for details  LIVING ENVIRONMENT: Lives with: lives with their family - now lives with brother Lives in: House/apartment  PLOF:  Level of assistance: Needed assistance with ADLs, Needed assistance with IADLS Employment: Retired  PATIENT GOALS: improve overall functioning  OBJECTIVE:  Note: Objective measures were completed at Evaluation unless otherwise noted.  COGNITION: Overall cognitive status: Impaired Areas of impairment:  Attention: Impaired: Focused, Sustained, Selective, Alternating, Divided Memory: Impaired: Immediate Working Short term Prospective Awareness: Impaired: Intellectual, Emergent, and Anticipatory Executive function: Impaired: Initiation, Impulse control, Problem solving, Organization, Error awareness, and Slow processing Functional deficits: Decreased intellectual awareness of deficits with sister providing usual prompting and A for case history. Adequate short term recall demonstrated and reported but exhibits decreased focused, alternating, and divided attention and reduced functional problem solving impacting safety awareness and judgement.   MOTOR SPEECH: Overall motor speech: impaired Level of impairment: Conversation Respiration: thoracic breathing and clavicular  breathing Phonation: hoarse and low vocal intensity Resonance: WFL Articulation: Appears  intact Intelligibility: Intelligible Motor planning: Impaired: inconsistent Interfering components: premorbid status Effective technique: slow rate, increased vocal intensity, over articulate, and pacing  ORAL MOTOR EXAMINATION: Overall status: Impaired:   Labial: Bilateral (Strength) Lingual: Bilateral (Coordination) Facial: Bilateral (Strength)  RECOMMENDATIONS FROM OBJECTIVE SWALLOW STUDY (MBSS/FEES):  May be warranted in future  Objective swallow impairments: none at this time  Objective recommended compensations: none at this time   CLINICAL SWALLOW ASSESSMENT:   Dentition: adequate natural dentition Vocal quality at baseline: hoarse, rough, and low vocal intensity Patient directly observed with POs: Yes: regular and thin liquids  Feeding: able to feed self Liquids provided by: cup [facility] Swallow Protocol: unable to maintain consecutive sips Oral phase signs and symptoms: anterior loss/spillage, prolonged bolus formation, and oral holding Pharyngeal phase signs and symptoms: multiple swallows and complaints of residue                                                                                                                           TREATMENT DATE:  08/01/23: ST evaluation and POC completed. Based on family's concern for dysphagia, particularly coughing with solids, completed clincial bedside swallow evaluation. Evidenced decreased attention to task and reduced oral awareness resulting in mild-mod oral residue. Provided recommendations for increased swallow safety, including small bites, avoid overfilling mouth, eye gaze forward for neutral head positioning, and minimizing distractions (ex: sitting at table versus in front of TV). Pt verbalized understanding and pt able to teach back recommendations x1 independently and others listed with occasional mod A.   PATIENT EDUCATION: Education details: see above Person educated: Patient and Sibling Education method:  Explanation Education comprehension: verbalized understanding and needs further education   GOALS: Goals reviewed with patient? Yes  SHORT TERM GOALS: Target date: 08/29/2023  Pt will teach back and demonstrate recommended swallow strategies given occasional min A Baseline: Goal status: INITIAL  2.  Pt will demonstrate adequate functional problem solving for 3/4 targeted scenarios given occasional min A  Baseline:  Goal status: INITIAL  3. Pt will employ attention strategies to optimize focus during targeted tasks/conversations given occasional min A Baseline:  Goal status: INITIAL  4.   Pt will utilize speech strategies to optimize communication effectiveness in 3/4 opportunities given occasional min A Baseline:  Goal status: INITIAL   LONG TERM GOALS: Target date: 09/26/2023  Pt will successfully carryover recommended swallow strategies at home > 1 week  Baseline:  Goal status: INITIAL  2.  Family will subjectively report improved attention and problem solving at home with use of trained techniques  Baseline:  Goal status: INITIAL  3.  Pt will subjectively report success using speech strategies to optimize communication effectiveness in 2 opportunities at home  Baseline:  Goal status: INITIAL  4.  Pt will complete objective swallow study to evaluate pharyngeal function and aspiration risk as needed Baseline:  Goal status: INITIAL   ASSESSMENT:  CLINICAL IMPRESSION:  Patient is a 73 y.o. F who was seen today for ST evaluation for PSP. Previously treated at this clinic in May-August 2023 and has returned today d/t functional decline. Today, pt exhibits mild decline in cognition, including reduced problem solving and decreased selective/alternating/divided attention. Pt endorsed speech changes but appears baseline per previous intervention (affirmed by sister). Presents with varied volume and intermittent pausing for thought formation. Reports coughing at meals,  particuraly with solids. Sister reports patient consumes large bites and eats in front of television. OME revealed reduced labial strength and reduced lingual coordination. Clinical swallow assessment was grossly WNL but exhibits reduced impulse control, notably over-filling oral cavity, resulting in mild-mod oral residue. Suspect dysphagia reports largely related to cognitive component but may refer for MBSS if warranted. Pt would benefit from skilled ST intervention to optimize overall functioning and safety.   OBJECTIVE IMPAIRMENTS: include attention, memory, awareness, executive functioning, dysarthria, and dysphagia. These impairments are limiting patient from household responsibilities, ADLs/IADLs, effectively communicating at home and in community, and safety when swallowing. Factors affecting potential to achieve goals and functional outcome are ability to learn/carryover information, co-morbidities, medical prognosis, and previous level of function. Patient will benefit from skilled SLP services to address above impairments and improve overall function.  REHAB POTENTIAL: Good  PLAN:  SLP FREQUENCY: 2x/week  SLP DURATION: 8 weeks (extended for scheduling)  PLANNED INTERVENTIONS: Aspiration precaution training, Pharyngeal strengthening exercises, Diet toleration management , Environmental controls, Cueing hierachy, Cognitive reorganization, Internal/external aids, Functional tasks, Multimodal communication approach, SLP instruction and feedback, Compensatory strategies, Patient/family education, [phone] Treatment of speech (30 or 45 min) , 92526 Treatment of swallowing function, and [id number]- Speech [address] [place], [place], [facility] [employer], [facility]    [employer], CCC-SLP 08/01/2023, 2:06 PM

## 2023-08-01 NOTE — Patient Instructions (Addendum)
 FOR EATING:   1) Tuck chin to prevent choking and to see it better  2) Put down fork or spoon in between bites to fully chew and swallow  3) Use smaller spoon or fork   4) Family to cut up things smaller and portion out if needed  5) Rotate b/t foods as well if needed (ex: 1 pc of chicken, then go to a different food like green beans)  6) Use a divided plate if needed and go clockwise around plate of food   FOR BRUSHING TEETH:   May try electric toothbrush    PRACTICE:   Card games on table  Memory game on table

## 2023-08-02 ENCOUNTER — Other Ambulatory Visit (HOSPITAL_COMMUNITY): Payer: Self-pay

## 2023-08-02 ENCOUNTER — Other Ambulatory Visit: Payer: Self-pay

## 2023-08-02 MED FILL — Omeprazole Cap Delayed Release 40 MG: ORAL | 30 days supply | Qty: 30 | Fill #0 | Status: AC

## 2023-08-02 MED FILL — Atorvastatin Calcium Tab 10 MG (Base Equivalent): ORAL | 30 days supply | Qty: 30 | Fill #4 | Status: AC

## 2023-08-02 MED FILL — Telmisartan-Hydrochlorothiazide Tab 40-12.5 MG: ORAL | 30 days supply | Qty: 30 | Fill #5 | Status: CN

## 2023-08-03 ENCOUNTER — Other Ambulatory Visit: Payer: Self-pay

## 2023-08-03 MED FILL — Telmisartan-Hydrochlorothiazide Tab 40-12.5 MG: ORAL | 30 days supply | Qty: 30 | Fill #5 | Status: AC

## 2023-08-08 ENCOUNTER — Ambulatory Visit: Attending: Internal Medicine

## 2023-08-08 ENCOUNTER — Ambulatory Visit: Admitting: Occupational Therapy

## 2023-08-08 ENCOUNTER — Other Ambulatory Visit: Payer: Self-pay

## 2023-08-08 DIAGNOSIS — R41841 Cognitive communication deficit: Secondary | ICD-10-CM | POA: Insufficient documentation

## 2023-08-08 DIAGNOSIS — R41840 Attention and concentration deficit: Secondary | ICD-10-CM | POA: Insufficient documentation

## 2023-08-08 DIAGNOSIS — R278 Other lack of coordination: Secondary | ICD-10-CM

## 2023-08-08 DIAGNOSIS — R41842 Visuospatial deficit: Secondary | ICD-10-CM

## 2023-08-08 DIAGNOSIS — M6281 Muscle weakness (generalized): Secondary | ICD-10-CM | POA: Insufficient documentation

## 2023-08-08 DIAGNOSIS — R1310 Dysphagia, unspecified: Secondary | ICD-10-CM | POA: Insufficient documentation

## 2023-08-08 DIAGNOSIS — R471 Dysarthria and anarthria: Secondary | ICD-10-CM | POA: Insufficient documentation

## 2023-08-08 DIAGNOSIS — R2681 Unsteadiness on feet: Secondary | ICD-10-CM | POA: Diagnosis not present

## 2023-08-08 NOTE — Therapy (Signed)
 OUTPATIENT SPEECH LANGUAGE PATHOLOGY TREATMENT   Patient Name: April Larson MRN: [id number] DOB:[birth year], 73 y.o., female Today's Date: 08/08/2023  PCP: Cleave Curling MD REFERRING PROVIDER: Wess Hammed, NP  END OF SESSION:  End of Session - 08/08/23 1100     Visit Number 2    Number of Visits 13    Date for SLP Re-Evaluation 09/26/23    Authorization Type [employer] Medicare - auth requested today via email    SLP Start Time 1148    SLP Stop Time  1230    SLP Time Calculation (min) 42 min    Activity Tolerance Patient tolerated treatment well              Past Medical History:  Diagnosis Date   Diabetes mellitus without complication (HCC)    Hypertension    Parkinson disease (HCC)    Substance abuse (HCC)    Past Surgical History:  Procedure Laterality Date   EYE SURGERY     Patient Active Problem List   Diagnosis Date Noted   OSA on CPAP 05/17/2023   Anemia 04/03/2023   Supranuclear palsy (HCC) 04/03/2023   Atypical syncope 03/04/2023   Seizure disorder (HCC) 03/04/2023   Seizure-like activity (HCC) 03/02/2023   Thrombocytosis 12/06/2022   Gait abnormality 12/06/2022   Encounter for annual physical exam 11/24/2022   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   Decreased dorsalis pedis pulse 11/17/2020   Tachycardia 08/11/2020   Cough 08/11/2020   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   Pure hypercholesterolemia 01/23/2020   Vitamin D  deficiency 10/23/2019   Right hip pain 05/15/2018   Estrogen deficiency 05/15/2018   Class 1 obesity due to excess calories with serious comorbidity and body mass index (BMI) of 31.0 to 31.9 in adult 05/15/2018   Diabetes mellitus with stage 2 chronic kidney disease (HCC) 01/11/2018   Benign hypertension with chronic kidney disease, stage II 01/11/2018    ONSET DATE: 07/04/2023 (referral date)   REFERRING DIAG: R47.1 (ICD-10-CM) - Dysarthria and anarthria R13.10 (ICD-10-CM) - Dysphagia, unspecified type G20.C  (ICD-10-CM) - Primary parkinsonism (HCC) R41.842 (ICD-10-CM) - Visuospatial deficit  THERAPY DIAG:  Dysphagia, unspecified type  Cognitive communication deficit  Dysarthria and anarthria  Rationale for Evaluation and Treatment: Rehabilitation  SUBJECTIVE:   SUBJECTIVE STATEMENT: "I can't see as well today"  Pt accompanied by: family member  PERTINENT HISTORY: PSP, OSA on CPAP, HTN, DM, hx of seizure/seizure-like activity, hx of substance abuse   PAIN: Are you having pain? No  FALLS: Has patient fallen in last 6 months?  Yes, See PT evaluation for details  LIVING ENVIRONMENT: Lives with: lives with their family - now lives with brother Lives in: House/apartment  PLOF:  Level of assistance: Needed assistance with ADLs, Needed assistance with IADLS Employment: Retired  PATIENT GOALS: improve overall functioning  OBJECTIVE:  Note: Objective measures were completed at Evaluation unless otherwise noted.    TREATMENT DATE:  08/08/23: Reviewed recommended swallow strategies provided at eval. Pt able to recall 1/4 strategies independently. Provided handout with recommendations. Endorsed improved swallow function with minimizing distractions and taking smaller bites. Targeted functional problem solving, with usual fading to occasional mod A to ID deficit/breakdown and potential solutions. Endorsed reduced verbal output related to increased volume. Conversational volume averaged low 80s dB. Targeted volume modification, with pt able to maintain WNL conversational volume with occasional min A.   08/01/23: ST evaluation and POC completed. Based on family's concern for dysphagia, particularly coughing with solids, completed clincial bedside swallow evaluation.  Evidenced decreased attention to task and reduced oral awareness resulting in mild-mod oral residue. Provided recommendations for increased swallow safety, including small bites, avoid overfilling mouth, eye gaze forward for neutral head  positioning, and minimizing distractions (ex: sitting at table versus in front of TV). Pt verbalized understanding and pt able to teach back recommendations x1 independently and others listed with occasional mod A.   PATIENT EDUCATION: Education details: see above Person educated: Patient and Sibling Education method: Explanation Education comprehension: verbalized understanding and needs further education   GOALS: Goals reviewed with patient? Yes  SHORT TERM GOALS: Target date: 08/29/2023  Pt will teach back and demonstrate recommended swallow strategies given occasional min A Baseline: Goal status: ONGOING  2.  Pt will demonstrate adequate functional problem solving for 3/4 targeted scenarios given occasional min A  Baseline:  Goal status: ONGOING  3. Pt will employ attention strategies to optimize focus during targeted tasks/conversations given occasional min A Baseline:  Goal status: ONGOING  4.   Pt will utilize speech strategies to optimize communication effectiveness in 3/4 opportunities given occasional min A Baseline:  Goal status: ONGOING   LONG TERM GOALS: Target date: 09/26/2023  Pt will successfully carryover recommended swallow strategies at home > 1 week  Baseline:  Goal status: ONGOING  2.  Family will subjectively report improved attention and problem solving at home with use of trained techniques  Baseline:  Goal status: ONGOING  3.  Pt will subjectively report success using speech strategies to optimize communication effectiveness in 2 opportunities at home  Baseline:  Goal status: ONGOING  4.  Pt will complete objective swallow study to evaluate pharyngeal function and aspiration risk as needed Baseline:  Goal status: ONGOING   ASSESSMENT:  CLINICAL IMPRESSION: Patient is a 73 y.o. F who was seen today for ST evaluation for PSP. Previously treated at this clinic in May-August 2023 and has returned today d/t functional decline. Today, pt exhibits  mild decline in cognition, including reduced problem solving and decreased selective/alternating/divided attention. Pt endorsed speech changes but appears baseline per previous intervention (affirmed by sister). Presents with varied volume and intermittent pausing for thought formation. Reports coughing at meals, particuraly with solids. Sister reports patient consumes large bites and eats in front of television. OME revealed reduced labial strength and reduced lingual coordination. Clinical swallow assessment was grossly WNL but exhibits reduced impulse control, notably over-filling oral cavity, resulting in mild-mod oral residue. Suspect dysphagia reports largely related to cognitive component but may refer for MBSS if warranted. Pt would benefit from skilled ST intervention to optimize overall functioning and safety.   OBJECTIVE IMPAIRMENTS: include attention, memory, awareness, executive functioning, dysarthria, and dysphagia. These impairments are limiting patient from household responsibilities, ADLs/IADLs, effectively communicating at home and in community, and safety when swallowing. Factors affecting potential to achieve goals and functional outcome are ability to learn/carryover information, co-morbidities, medical prognosis, and previous level of function. Patient will benefit from skilled SLP services to address above impairments and improve overall function.  REHAB POTENTIAL: Good  PLAN:  SLP FREQUENCY: 2x/week  SLP DURATION: 8 weeks (extended for scheduling)  PLANNED INTERVENTIONS: Aspiration precaution training, Pharyngeal strengthening exercises, Diet toleration management , Environmental controls, Cueing hierachy, Cognitive reorganization, Internal/external aids, Functional tasks, Multimodal communication approach, SLP instruction and feedback, Compensatory strategies, Patient/family education, [phone] Treatment of speech (30 or 45 min) , 92526 Treatment of swallowing function, and [id number]-  Speech [address] [place], [place], [address], [place]    [employer], CCC-SLP  08/08/2023, 11:48 AM

## 2023-08-08 NOTE — Therapy (Signed)
 OUTPATIENT OCCUPATIONAL THERAPY NEURO TREATMENT / RE-CERT  Patient Name: Anushka Vilikia Watkins [place] MRN: [id number] DOB:22-Aug-1950, 73 y.o., female Today's Date: 08/08/2023  PCP: Cleave Curling, MD  REFERRING PROVIDER: Wess Hammed, NP   END OF SESSION:  OT End of Session - 08/08/23 1156     Visit Number 3    Number of Visits 10    Date for OT Re-Evaluation 09/09/23    Authorization Type [employer] Dual, no auth required    OT Start Time 1105    OT Stop Time 1145    OT Time Calculation (min) 40 min    Activity Tolerance Patient tolerated treatment well    Behavior During Therapy WFL for tasks assessed/performed              Past Medical History:  Diagnosis Date   Diabetes mellitus without complication (HCC)    Hypertension    Parkinson disease (HCC)    Substance abuse (HCC)    Past Surgical History:  Procedure Laterality Date   EYE SURGERY     Patient Active Problem List   Diagnosis Date Noted   OSA on CPAP 05/17/2023   Anemia 04/03/2023   Supranuclear palsy (HCC) 04/03/2023   Atypical syncope 03/04/2023   Seizure disorder (HCC) 03/04/2023   Seizure-like activity (HCC) 03/02/2023   Thrombocytosis 12/06/2022   Gait abnormality 12/06/2022   Encounter for annual physical exam 11/24/2022   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   Decreased dorsalis pedis pulse 11/17/2020   Tachycardia 08/11/2020   Cough 08/11/2020   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   Pure hypercholesterolemia 01/23/2020   Vitamin D  deficiency 10/23/2019   Right hip pain 05/15/2018   Estrogen deficiency 05/15/2018   Class 1 obesity due to excess calories with serious comorbidity and body mass index (BMI) of 31.0 to 31.9 in adult 05/15/2018   Diabetes mellitus with stage 2 chronic kidney disease (HCC) 01/11/2018   Benign hypertension with chronic kidney disease, stage II 01/11/2018    ONSET DATE: 07/04/2023 (referral date)  REFERRING DIAG:  R47.1 (ICD-10-CM) - Dysarthria and anarthria   R41.841 (ICD-10-CM) - Cognitive communication deficit  R13.10 (ICD-10-CM) - Dysphagia, unspecified type  G20.C (ICD-10-CM) - Primary parkinsonism (HCC)  R41.842 (ICD-10-CM) - Visuospatial deficit  R29.3 (ICD-10-CM) - Abnormal posture  R41.840 (ICD-10-CM) - Attention and concentration deficit  M62.81 (ICD-10-CM) - Muscle weakness (generalized)   07/04/23 Referral Notes: PSP   THERAPY DIAG:  Other lack of coordination - Plan: Ot plan of care cert/re-cert  Unsteadiness on feet - Plan: Ot plan of care cert/re-cert  Muscle weakness (generalized) - Plan: Ot plan of care cert/re-cert  Visuospatial deficit - Plan: Ot plan of care cert/re-cert  Attention and concentration deficit - Plan: Ot plan of care cert/re-cert  Rationale for Evaluation and Treatment: Rehabilitation  SUBJECTIVE:   SUBJECTIVE STATEMENT: Pt reported no new updates or acute changes. Pt's family reported intent to purchase electric toothbrush later today. Pt and family reported improved ability to control speed of eating and using chin tuck strategy to decrease instances of coughing.  Pt accompanied by: self and sister Marily Shows)  PERTINENT HISTORY: PSP, OSA on CPAP, HTN, DM, hx of seizure/seizure-like activity, hx of substance abuse  PRECAUTIONS: Fall, no driving, To reduce fall risk: SBA or CGA when walking, recommended to stabilize chair when pt completes sit-to-stand/stand-to-sit transitions d/t tendency for retropulsion. Recommended increased v/c secondary visual deficits.  WEIGHT BEARING RESTRICTIONS: No  PAIN:  Are you having pain? No  FALLS: Has patient fallen in last 6  months? Yes. Number of falls 5-6 falls, 1 fall occurred day before yesterday - pt fell back when walking d/t focusing on something else     LIVING ENVIRONMENT: Lives with: lives with their family - brother Lives in: House/apartment Stairs: Yes: External: 3 stairs steps; bilateral but cannot reach both - previously had a ramp but not at this  time Has following equipment at home: Single point cane and Walker - 2 wheeled  PLOF: Patient was last living independently about a year ago (9 months) and also stopped driving about 9 months   PATIENT GOALS: "to be able to pick up stuff"  OBJECTIVE:  Note: Objective measures were completed at Evaluation unless otherwise noted.  HAND DOMINANCE: Right  ADLs: Overall ADLs:  Transfers/ambulation related to ADLs: Eating: difficulty with swallowing, supposed to eat small portions, speeds up when eating Grooming: difficulty with toothbrushing - standard toothbrush UB Dressing: ind with extra time LB Dressing: unable to look down therefore difficult, assistance from family Toileting: ind Bathing: ind for completing washing tasks, shower chair, grab bars - family direct supervision Tub Shower transfers: walk-in shower Equipment: Shower seat with back, Grab bars, and Walk in shower  IADLs: dependent  MOBILITY STATUS: Hx of falls and 2-wheeled walker, "I do freeze"   POSTURE COMMENTS:  Sitting ind  ACTIVITY TOLERANCE: Activity tolerance: Pt reported feeling more tired throughout the day.  FUNCTIONAL OUTCOME MEASURES: PSFS: 1.3    Total score = sum of the activity scores/number of activities Minimum detectable change (90%CI) for average score = 2 points Minimum detectable change (90%CI) for single activity score = 3 points   UPPER EXTREMITY ROM:    Active ROM Right eval Left eval  Shoulder flexion Approx. 90* Approx. 90*  Shoulder abduction    Shoulder adduction    Shoulder extension    Shoulder internal rotation Pt able to reach behind head though pt reported tightness in shoulders, RUE tightness >LUE tightness Pt able to reach behind head though pt reported tightness in shoulders, RUE tightness >LUE tightness  Shoulder external rotation    Elbow flexion [facility] WFL  Elbow extension WFL, v/c for full ext [facility]  Wrist flexion [facility] WFL  Wrist extension [facility] WFL  Wrist ulnar  deviation    Wrist radial deviation    Wrist pronation [facility] WFL  Wrist supination WFL WFL  (Blank rows = not tested)  BUE digit flex/ext - WFL, additional v/c for full ext of thumbs  UPPER EXTREMITY MMT:     MMT Right eval Left eval  Shoulder flexion    Shoulder abduction    Shoulder adduction    Shoulder extension    Shoulder internal rotation    Shoulder external rotation    Middle trapezius    Lower trapezius    Elbow flexion    Elbow extension    Wrist flexion    Wrist extension    Wrist ulnar deviation    Wrist radial deviation    Wrist pronation    Wrist supination    (Blank rows = not tested)  HAND FUNCTION: Grip strength: Right: 28.6, 26.6, 20.7 (25.3 lbs average) lbs; Left: 28.6, 27.5, 25.7 (27.3 lbs average)  lbs  COORDINATION: Box and Blocks:  Right 18blocks, Left 26blocks  SENSATION: Pt denied numbness/tingling  EDEMA: None noted  MUSCLE TONE: RUE: Within functional limits and LUE: Within functional limits  COGNITION: Overall cognitive status: Impaired Decreased insight into deficits and decreased safety awareness based on observation and per pt's sister report.  VISION: Subjective report: Pt's sister reported pt frequently looks up, fixed gaze and no peripheral vision.  Baseline vision: Wears glasses all the time for distance vision.  Visual history:  None noted , had corrective cataracts surgery both eyes  VISION ASSESSMENT: OT noted pt demo'd reduced eye blinking and often demo'd fixed upward gaze. Pt sometimes changed direction of fixed gaze towards a task or towards a person if another person was talking. However, pt generally unable to focus directly on task or directly on person talking.   PERCEPTION: Not tested  PRAXIS: Not tested  OBSERVATIONS: Pt was pleasant. Pt accompanied by family member. Pt demo'd fixed upward gaze though able to sometimes deviate gaze during conversation to attempt eye contact or to partially look at task. Pt  demo'd decreased safety awareness and decreased insight into deficits. Pt benefited from SBA to CGA and gait belt during ambulation with 2WW to reduce fall risk. Pt benefited from v/c to keep walker close to pt and to keep walker on ground. Pt benefited from continuous v/c to navigate environment and to complete sit-to-stand/stand-to-sit transfers. Pt benefited from OT stabilizing chair when completing sit-to-stand/stand-to-sit transfers d/t pt demo'ing retropulsion when sitting down.                                                                                                                            TREATMENT DATE:   Self-Care OT provided v/c and education regarding Safety with sit-to-stand/stand-to-sit and ambulation. Pt acknowledged understanding though benefited from v/c.  OT educated pt on Visual scanning strategies (e.g. chin tuck, brightly colored border for visual anchor on objects at home, scanning environment) - to improve visual scanning and safety. Pt benefited from mod v/c throughout session for visual scanning strategies.   TherAct: OT assessed pt's progress towards goals, see below for updates.   HEP update: yellow theraputty - to improve FM coordination and dexterity. Pt returned demo.  Access Code: [email or web address] URL: [email or web address] Date: 08/08/2023 Prepared by: Daina Drum  Exercises - Putty Squeezes  - 2 x daily - 1 sets - 10 reps - Rolling Putty on Table  - 2 x daily - 1 sets - 10 reps - Tip Pinch with Putty  - 2 x daily - 1 sets - 10 reps - Seated Three Finger Pinch with Putty  - 2 x daily - 1 sets - 10 reps   OT placed 6 BlazePods in front of patient at tabletop and had patient tap pods with palm of hand as pods lit up using random mode forfine motor coordination, upper extremity range of motion, reaction time, scanning and locating of items, processing, sequencing of unfamiliar motor movements or tasks, and motor planning.   Hits were as follows:   RUE: no errors, located items fairly quickly though noted pause over Blaze Pod before hitting Blaze Pods. LUE: no errors, located items fairly quickly, less hesitancy with LUE compared to RUE, and tended to try anticipating pods lighting up  as evidenced by hovering hand over some Blaze Pods despite pods not lit up.   PATIENT EDUCATION: Education details: see today's tx above Person educated: Patient and family member Education method: Explanation Education comprehension: verbalized understanding  HOME EXERCISE PROGRAM: 08/01/23: eating strategies, visual scanning activities on table 08/08/23 - Theraputty HEP (Access Code: [email or web address])  GOALS: Goals reviewed with patient? Yes  SHORT TERM GOALS: Target date: 07/25/23  Pt and caregiver will return demo of BUE HEP using visual handouts and no more than min v/c. Baseline: Pt and family reported pt does not have HEP from previous OT POC a few years ago. Goal status: INITIAL  2.  Pt and caregiver will verbalize understanding of A/E and adaptive strategies for navigating phone safely.  Baseline: Pt and family reported difficulty with navigating phone. Pt's family expressed concerns that pt has too many apps on phone. 08/08/23 - Pt reported continuing to look into phone options.  Goal status: in progress  3.  Pt and caregiver will verbalize understanding of vision compensation strategies and environmental adaptations to promote safety in home environment for navigation during ambulation.  Baseline: Visual deficits, decreased insights into deficits 08/08/23 - Pt recalled vision strategy of "chin tuck" with mod v/c. OT educated pt on additional vision strategies. Pt and family acknowledged understanding. Goal status: in progress   LONG TERM GOALS: Target date: 09/09/23 (per 4/0/98 re-cert)  Pt will maintain BUE grip strength as needed to maintain grasp of toothbrush and eating utensils. Baseline: Grip strength: Right: 28.6, 26.6, 20.7 (25.3 lbs  average) lbs; Left: 28.6, 27.5, 25.7 (27.3 lbs average)  lbs 08/08/23 - RUE: 25.3 lbs. LUE: 23.8 lbs.  (Maintained RUE, slightly decreased on LUE). Goal status: in progress  2.  Pt will maintain FM coordination as evidenced by maintaining box&blocks score of BUE. Baseline: Box and Blocks:  Right 18blocks, Left 26blocks 08/08/23 - RUE: 14 blocks. LUE: 26 blocks (maintained LUE, slight decrease for RUE) Goal status: in progress   3.  Pt and caregiver will verbalize understanding of adaptive strategies and A/E options to promote safety and decrease fall risk during ADL tasks. Baseline: Pt demo'd decreased insight into deficits, decreased safety awareness. Pt and family reported difficulty with holding toothbrush, completing LB dressing, picking up small items, controlling speed of eating.  08/08/23 - Pt's family reported intent to purchase electric toothbrush later today. Pt and family reported improved ability to control speed of eating and using chin tuck strategy to decrease instances of coughing. Goal status: in progress  4.  Patient will report at least two-point increase in average PSFS score or at least three-point increase in a single activity score indicating functionally significant improvement given minimum detectable change using A/E and adaptive strategies PRN. Baseline: PSFS: 1.3 total score (See above for individual activity scores)  Goal status: in progress  ASSESSMENT:  CLINICAL IMPRESSION: Patient seen today for first occupational therapy treatment (eval on 07/11/23) for PSP and associated symptoms. Hx includes PSP, OSA on CPAP, HTN, DM, hx of seizure/seizure-like activity, hx of substance abuse. Patient currently presents below baseline level of functioning demonstrating functional deficits and impairments as noted below. Pt tolerated tasks well though continues to benefit from v/c to attend to visual scanning strategies and safety during functional tasks. Pt's family reporting improved  carryover of strategies in home environment. Pt would continue to benefit from additional OT services to allow additional time for education related to safety with functional tasks and to target strength and coordination of BUE.  The patient requires the skill of OT to help with ongoing exercise and mobility to prevent rapid and significant decline in functional mobility, strength, and ADL function. Pt and caregivers would also benefit from education regarding safety, A/E, and adaptive strategies in the home environment. Therefore, skilled maintenance OT is recommended for this patient.  PERFORMANCE DEFICITS: in functional skills including ADLs, IADLs, coordination, dexterity, proprioception, sensation, ROM, strength, flexibility, Fine motor control, Gross motor control, mobility, balance, body mechanics, endurance, decreased knowledge of precautions, decreased knowledge of use of DME, vision, and UE functional use, cognitive skills including attention, energy/drive, learn, memory, problem solving, safety awareness, and sequencing, and psychosocial skills including environmental adaptation.   IMPAIRMENTS: are limiting patient from ADLs, IADLs, rest and sleep, leisure, and social participation.   CO-MORBIDITIES: has co-morbidities such as PSP  that affects occupational performance. Patient will benefit from skilled OT to address above impairments and improve overall function.  MODIFICATION OR ASSISTANCE TO COMPLETE EVALUATION: Maximum or significant modification of tasks or assist is necessary to complete an evaluation.  OT OCCUPATIONAL PROFILE AND HISTORY: Detailed assessment: Review of records and additional review of physical, cognitive, psychosocial history related to current functional performance.  CLINICAL DECISION MAKING: High - multiple treatment options, significant modification of task necessary  REHAB POTENTIAL: Fair d/t progression of PSP  EVALUATION COMPLEXITY: High    PLAN:  OT  FREQUENCY: 2x/week  OT DURATION: 4 weeks + ADDITIONAL 2X PER WEEK FOR 4 WEEKS (per 09/05/11 re-cert)  PLANNED INTERVENTIONS: 08657 OT Re-evaluation, 97535 self care/ADL training, 84696 therapeutic exercise, 97530 therapeutic activity, 97112 neuromuscular re-education, 97140 manual therapy, 97035 ultrasound, 97018 paraffin, 29528 fluidotherapy, 97010 moist heat, 97010 cryotherapy, 97760 Orthotics management and training, 41324 Splinting (initial encounter), [id number] Subsequent splinting/medication, passive range of motion, functional mobility training, visual/perceptual remediation/compensation, energy conservation, patient/family education, and DME and/or AE instructions  RECOMMENDED OTHER SERVICES: PT eval already completed. SLP referral placed.  CONSULTED AND AGREED WITH PLAN OF CARE: Patient  PLAN FOR NEXT SESSION:  Follow up on eating strategies and other recommendations - how is it going? Did pt get electric toothbrush? New phone with accessibility options? HEP - FM coordination (limit 1-3 exercises) Review HEP PRN Visual scanning exercises   Oakley Bellman, OT 08/08/2023, 12:06 PM

## 2023-08-08 NOTE — Patient Instructions (Addendum)
 Swallow Recommendations:  Small bites  One bite at a time No distractions Level hard    Use clear communication. I want you telling family what you need versus "I don't know"   Use your normal volume at home. Family can remind you if you are too loud or if they can't hear you

## 2023-08-10 ENCOUNTER — Ambulatory Visit: Payer: Medicare HMO

## 2023-08-10 ENCOUNTER — Other Ambulatory Visit: Payer: Self-pay | Admitting: *Deleted

## 2023-08-10 VITALS — BP 128/70 | HR 82 | Temp 98.3°F | Ht 62.0 in | Wt 152.2 lb

## 2023-08-10 DIAGNOSIS — Z0000 Encounter for general adult medical examination without abnormal findings: Secondary | ICD-10-CM

## 2023-08-10 MED ORDER — RIVASTIGMINE TARTRATE 1.5 MG PO CAPS: 1.5000 mg | ORAL_CAPSULE | Freq: Two times a day (BID) | ORAL | 3 refills | Status: DC

## 2023-08-10 NOTE — Patient Instructions (Addendum)
 Ms. April Larson , Thank you for taking time to come for your Medicare Wellness Visit. I appreciate your ongoing commitment to your health goals. Please review the following plan we discussed and let me know if I can assist you in the future.   Referrals/Orders/Follow-Ups/Clinician Recommendations: none  This is a list of the screening recommended for you and due dates:  Health Maintenance  Topic Date Due   COVID-19 Vaccine (6 - 2024-25 season) 12/05/2022   Eye exam for diabetics  08/19/2023   Flu Shot  11/04/2023   Hemoglobin A1C  11/13/2023   Yearly kidney health urinalysis for diabetes  11/24/2023   Complete foot exam   11/24/2023   Yearly kidney function blood test for diabetes  05/15/2024   Medicare Annual Wellness Visit  08/09/2024   Mammogram  01/23/2025   Colon Cancer Screening  03/17/2025   DTaP/Tdap/Td vaccine (2 - Td or Tdap) 09/17/2025   Pneumonia Vaccine  Completed   DEXA scan (bone density measurement)  Completed   Hepatitis C Screening  Completed   Zoster (Shingles) Vaccine  Completed   HPV Vaccine  Aged Out   Meningitis B Vaccine  Aged Out    Advanced directives: (Copy Requested) Please bring a copy of your health care power of attorney and living will to the office to be added to your chart at your convenience. You can mail to [facility] [address] [place], [place] [address] or email to [email or web address]  Next Medicare Annual Wellness Visit scheduled for next year: No, office will schedule  Have you seen your provider in the last 6 months (3 months if uncontrolled diabetes)? Yes, appointment in 09/19/2023  insert Preventive Care attachment Insert FALL PREVENTION attachment if needed

## 2023-08-10 NOTE — Progress Notes (Signed)
 Subjective:   April Larson is a 73 y.o. who presents for a Medicare Wellness preventive visit.  Visit Complete: In person    Persons Participating in Visit: Patient assisted by sister.  AWV Questionnaire: No: Patient Medicare AWV questionnaire was not completed prior to this visit.  Cardiac Risk Factors include: advanced age (>70men, >59 women);diabetes mellitus;hypertension     Objective:    Today's Vitals   08/10/23 1201  BP: 128/70  Pulse: 82  Temp: 98.3 F (36.8 C)  TempSrc: Oral  SpO2: 98%  Weight: 152 lb 3.2 oz (69 kg)  Height: 5\' 2"  (1.575 m)   Body mass index is 27.84 kg/m.     08/10/2023   12:13 PM 08/01/2023   11:43 AM 07/11/2023   10:22 AM 07/01/2023    9:37 AM 03/02/2023    2:15 PM 08/04/2022    3:07 PM 10/08/2021    9:29 AM  Advanced Directives  Does Patient Have a Medical Advance Directive? Yes No No No No No No  Type of Advance Directive Living will        Would patient like information on creating a medical advance directive?  No - Guardian declined No - Guardian declined Yes (MAU/Ambulatory/Procedural Areas - Information given) No - Patient declined      Current Medications (verified) Outpatient Encounter Medications as of 08/10/2023  Medication Sig   ACCU-CHEK FASTCLIX LANCETS MISC USE TO CHECK BLOOD SUGARS TWICE DAILY   Alcohol Swabs (B-D SINGLE USE SWABS REGULAR) PADS USE AS NEEDED.   aspirin  EC 81 MG tablet Take 1 tablet (81 mg total) by mouth daily. (Patient taking differently: Take 81 mg by mouth See admin instructions. Take 81 mg by mouth between 8 AM-10 AM)   atorvastatin  (LIPITOR) 10 MG tablet TAKE 1 TABLET EVERY DAY   Biotin  10 MG CAPS Take 1 tablet by mouth daily. (Patient taking differently: Take 10 mg by mouth See admin instructions. Take 10 mg by mouth at 10:30 AM)   Blood Glucose Monitoring Suppl (TRUE METRIX AIR GLUCOSE METER) DEVI USE AS DIRECTED TO CHECK BLOOD SUGARS.   Blood Glucose Monitoring Suppl (TRUE METRIX  METER) w/Device KIT Use as directed to check blood sugar once daily   calcium  carbonate (OSCAL) 1500 (600 Ca) MG TABS tablet Take 1 tablet (1,500 mg total) by mouth daily with breakfast. (Patient taking differently: Take 1,500 mg by mouth See admin instructions. Take 1,500 mg by mouth at 10:30 AM)   carbidopa -levodopa  (SINEMET  IR) 25-100 MG tablet Take 1 tablet by mouth 4 (four) times daily (1 tablet before breakfast, 1 tablet at noon, 1 tablet in the evening, and 1 tablet at bedtime).   Cholecalciferol  (VITAMIN D3) 125 MCG (5000 UT) CAPS Take 5,000 Units by mouth See admin instructions. Take 5,000 units by mouth at 10:30 AM   diclofenac  Sodium (VOLTAREN ) 1 % GEL APPLY 2 GRAMS TOPICALLY TO THE AFFECTED AREA FOUR TIMES DAILY (Patient taking differently: Apply 2 g topically See admin instructions. Apply 2 grams to the neck, under the breasts, and to the back 2 times a day)   fluticasone  (FLONASE ) 50 MCG/ACT nasal spray Place 2 sprays into both nostrils daily. (Patient taking differently: Place 2 sprays into both nostrils in the morning.)   glucose blood (TRUE METRIX BLOOD GLUCOSE TEST) test strip Use as instructed   Insulin  Pen Needle (B-D ULTRAFINE III SHORT PEN) 31G X 8 MM MISC Use as directed   loratadine  (CLARITIN ) 10 MG tablet Take 1 tablet by  mouth daily (Patient taking differently: Take 10 mg by mouth in the morning. Take 10 mg by mouth at 10:30 AM)   MAGNESIUM  PO Take 1 tablet by mouth at bedtime.   Multiple Vitamins-Minerals (ONE-A-DAY WOMENS 50+ ADVANTAGE) TABS Take 1 tablet by mouth daily (Patient taking differently: Take 1 tablet by mouth See admin instructions. Take 1 tablet by mouth at 10:30 AM)   Omega-3 Fatty Acids (FISH OIL ) 1000 MG CAPS Take 1 capsule by mouth daily (Patient taking differently: Take 1,000 mg by mouth See admin instructions. Take 1,000 mg by mouth at 3 PM)   omeprazole  (PRILOSEC) 40 MG capsule Take 1 capsule (40 mg total) by mouth daily before breakfast.   rivastigmine   (EXELON ) 1.5 MG capsule Take 1 capsule (1.5 mg total) by mouth 2 (two) times daily.   Semaglutide ,0.25 or 0.5MG /DOS, (OZEMPIC , 0.25 OR 0.5 MG/DOSE,) 2 MG/3ML SOPN Inject 0.25 mg into the skin once a week.   telmisartan -hydrochlorothiazide  (MICARDIS  HCT) 40-12.5 MG tablet Take 1 tablet by mouth every morning.   TRUEplus Lancets 28G MISC USE AS DIRECTED TO CHECK BLOOD SUGARS.   Turmeric 500 MG CAPS Take 1 capsule by mouth 2 (two) times daily. (Patient taking differently: Take 500 mg by mouth See admin instructions. Take 500 mg by mouth at 7 AM and 4:30 PM)   TYLENOL  8 HOUR ARTHRITIS PAIN 650 MG CR tablet Take 1,300 mg by mouth at bedtime.   vitamin C  (ASCORBIC ACID ) 500 MG tablet Take 1 tablet (500 mg total) by mouth daily. (Patient taking differently: Take 500 mg by mouth See admin instructions. Take 500 mg by mouth at 10:30 AM)   clopidogrel  (PLAVIX ) 75 MG tablet Take 75 mg by mouth See admin instructions. Take 75 mg by mouth at 3:30 PM (Patient not taking: Reported on 08/10/2023)   metFORMIN  (GLUCOPHAGE ) 500 MG tablet TAKE ONE TABLET BY MOUTH EVERYDAY AT BEDTIME (Patient not taking: Reported on 05/17/2023)   No facility-administered encounter medications on file as of 08/10/2023.    Allergies (verified) Patient has no known allergies.   History: Past Medical History:  Diagnosis Date   Diabetes mellitus without complication (HCC)    Hypertension    Parkinson disease (HCC)    Substance abuse (HCC)    Past Surgical History:  Procedure Laterality Date   EYE SURGERY     Family History  Problem Relation Age of Onset   Cancer Mother    Diabetes Father    Cancer Brother    Breast cancer Maternal Aunt    Parkinsonism Neg Hx    Social History   Socioeconomic History   Marital status: Widowed    Spouse name: Not on file   Number of children: Not on file   Years of education: Not on file   Highest education level: Not on file  Occupational History   Occupation: retired  Tobacco Use    Smoking status: Former    Current packs/day: 0.00    Average packs/day: 1 pack/day for 37.0 years (37.0 ttl pk-yrs)    Types: Cigarettes    Start date: 40    Quit date: 04/08/2005    Years since quitting: 18.3   Smokeless tobacco: Never  Vaping Use   Vaping status: Never Used  Substance and Sexual Activity   Alcohol use: No    Alcohol/week: 0.0 standard drinks of alcohol   Drug use: No   Sexual activity: Not Currently  Other Topics Concern   Not on file  Social History Narrative  Not on file   Social Drivers of Health   Financial Resource Strain: Low Risk  (08/10/2023)   Overall Financial Resource Strain (CARDIA)    Difficulty of Paying Living Expenses: Not hard at all  Food Insecurity: No Food Insecurity (08/10/2023)   Hunger Vital Sign    Worried About Running Out of Food in the Last Year: Never true    Ran Out of Food in the Last Year: Never true  Transportation Needs: No Transportation Needs (08/10/2023)   PRAPARE - Administrator, Civil Service (Medical): No    Lack of Transportation (Non-Medical): No  Physical Activity: Insufficiently Active (08/10/2023)   Exercise Vital Sign    Days of Exercise per Week: 7 days    Minutes of Exercise per Session: 10 min  Stress: No Stress Concern Present (08/10/2023)   [employer] of Occupational Health - Occupational Stress Questionnaire    Feeling of Stress : Not at all  Social Connections: Moderately Integrated (08/10/2023)   Social Connection and Isolation Panel [NHANES]    Frequency of Communication with Friends and Family: More than three times a week    Frequency of Social Gatherings with Friends and Family: More than three times a week    Attends Religious Services: More than 4 times per year    Active Member of [employer] or Organizations: Yes    Attends Banker Meetings: More than 4 times per year    Marital Status: Widowed    Tobacco Counseling Counseling given: Not Answered    Clinical  Intake:  Pre-visit preparation completed: Yes  Pain : No/denies pain     Nutritional Risks: None Diabetes: Yes CBG done?: No Did pt. bring in CBG monitor from home?: No  Lab Results  Component Value Date   HGBA1C 7.0 (H) 05/16/2023   HGBA1C 6.5 (H) 03/02/2023   HGBA1C 6.5 (H) 11/24/2022     How often do you need to have someone help you when you read instructions, pamphlets, or other written materials from your doctor or pharmacy?: 1 - Never  Interpreter Needed?: No  Information entered by :: NAllen LPN   Activities of Daily Living     08/10/2023   12:04 PM 03/02/2023    9:00 PM  In your present state of health, do you have any difficulty performing the following activities:  Hearing? 0 0  Vision? 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 0 1  Walking or climbing stairs? 1   Dressing or bathing? 0   Doing errands, shopping? 1 1  Preparing Food and eating ? Y   Using the Toilet? N   In the past six months, have you accidently leaked urine? N   Do you have problems with loss of bowel control? N   Managing your Medications? Y   Comment brother manages   Managing your Finances? Y   Housekeeping or managing your Housekeeping? Y     Patient Care Team: Cleave Curling, MD as PCP - General (Internal Medicine) Devin Foerster, MD as Consulting Physician (Ophthalmology)  Indicate any recent Medical Services you may have received from other than [facility] providers in the past year (date may be approximate).     Assessment:   This is a routine wellness examination for April Larson.  Hearing/Vision screen Hearing Screening - Comments:: Denies hearing issues Vision Screening - Comments:: Regular eye exams, [facility]   Goals Addressed  This Visit's Progress    Patient Stated       08/10/2023, continue exercise after rehab       Depression Screen     08/10/2023   12:14 PM 03/14/2023    3:48 PM 08/04/2022    3:09 PM  08/03/2022    3:35 PM 03/04/2022   11:52 AM 07/23/2021    3:47 PM 07/08/2021    3:24 PM  PHQ 2/9 Scores  PHQ - 2 Score 0 0 0 0 0 0 0  PHQ- 9 Score 0 0  0       Fall Risk     08/10/2023   12:13 PM 03/14/2023    3:48 PM 08/04/2022    3:07 PM 08/03/2022    3:35 PM 03/04/2022   11:56 AM  Fall Risk   Falls in the past year? 1 1 1  0 1  Comment tripped, lost balance  trying to reach up higher    Number falls in past yr: 1 1 1  0 1  Injury with Fall? 0 1 0 0 1  Risk for fall due to : History of fall(s);Impaired balance/gait;Impaired mobility;Impaired vision;Medication side effect History of fall(s) History of fall(s);Medication side effect No Fall Risks Impaired balance/gait;Impaired mobility  Follow up Falls prevention discussed;Falls evaluation completed Falls evaluation completed Falls prevention discussed;Education provided;Falls evaluation completed Falls evaluation completed Education provided    MEDICARE RISK AT HOME:  Medicare Risk at Home Any stairs in or around the home?: Yes If so, are there any without handrails?: No Home free of loose throw rugs in walkways, pet beds, electrical cords, etc?: Yes Adequate lighting in your home to reduce risk of falls?: Yes Life alert?: No Use of a cane, walker or w/c?: Yes Grab bars in the bathroom?: Yes Shower chair or bench in shower?: Yes Elevated toilet seat or a handicapped toilet?: Yes  TIMED UP AND GO:  Was the test performed?  Yes  Length of time to ambulate 10 feet: 8 sec Gait slow and steady with assistive device  Cognitive Function: 6CIT completed    05/17/2023    3:42 PM 09/13/2022    2:10 PM 09/13/2022    1:40 PM 04/15/2022    3:00 PM 10/08/2021   10:59 AM  MMSE - Mini Mental State Exam  Orientation to time 5 5 5 5 5   Orientation to Place 3 5 5 5 5   Registration 3 3 3 3 3   Attention/ Calculation 3 3 3 5 4   Recall 3 2 2 3 3   Language- name 2 objects 2 2 2 2 2   Language- repeat 1 1 1 1 1   Language- follow 3 step command 3 3 3  3 3   Language- read & follow direction 1 1 1 1 1   Write a sentence 1 1 1 1 1   Copy design 0 0  0 0  Total score 25 26  29 28         08/10/2023   12:15 PM 08/04/2022    3:10 PM 07/23/2021    3:48 PM 06/19/2020   11:13 AM 06/20/2019   12:26 PM  6CIT Screen  What Year? 0 points 0 points 0 points 0 points 0 points  What month? 0 points 0 points 0 points 0 points 0 points  What time? 0 points 0 points 0 points 0 points 0 points  Count back from 20 0 points 0 points 0 points 0 points 0 points  Months in reverse 0 points 0 points 0 points  0 points 0 points  Repeat phrase 0 points 0 points 0 points 0 points 0 points  Total Score 0 points 0 points 0 points 0 points 0 points    Immunizations Immunization History  Administered Date(s) Administered   Fluad Quad(high Dose 65+) 01/01/2019, 12/27/2019, 12/22/2021   Influenza, High Dose Seasonal PF 01/12/2018, 01/01/2019   Influenza-Unspecified 12/30/2020   PFIZER(Purple Top)SARS-COV-2 Vaccination 04/26/2019, 05/18/2019, 03/22/2020, 09/22/2020   Pfizer Covid-19 Vaccine Bivalent Booster 38yrs & up 01/10/2021   Pneumococcal Conjugate-13 10/13/2018   Pneumococcal Polysaccharide-23 03/02/2017   Tdap 09/18/2015   Zoster Recombinant(Shingrix) 11/22/2018, 02/25/2019   Zoster, Live 11/04/2015    Screening Tests Health Maintenance  Topic Date Due   COVID-19 Vaccine (6 - 2024-25 season) 12/05/2022   OPHTHALMOLOGY EXAM  08/19/2023   INFLUENZA VACCINE  11/04/2023   HEMOGLOBIN A1C  11/13/2023   Diabetic kidney evaluation - Urine ACR  11/24/2023   FOOT EXAM  11/24/2023   Diabetic kidney evaluation - eGFR measurement  05/15/2024   Medicare Annual Wellness (AWV)  08/09/2024   MAMMOGRAM  01/23/2025   Colonoscopy  03/17/2025   DTaP/Tdap/Td (2 - Td or Tdap) 09/17/2025   Pneumonia Vaccine 69+ Years old  Completed   DEXA SCAN  Completed   Hepatitis C Screening  Completed   Zoster Vaccines- Shingrix  Completed   HPV VACCINES  Aged Out   Meningococcal B  Vaccine  Aged Out    Health Maintenance  Health Maintenance Due  Topic Date Due   COVID-19 Vaccine (6 - 2024-25 season) 12/05/2022   Health Maintenance Items Addressed: States had covid. Will check up on  Additional Screening:  Vision Screening: Recommended annual ophthalmology exams for early detection of glaucoma and other disorders of the eye.  Dental Screening: Recommended annual dental exams for proper oral hygiene  Community Resource Referral / Chronic Care Management: CRR required this visit?  No   CCM required this visit?  No     Plan:     I have personally reviewed and noted the following in the patient's chart:   Medical and social history Use of alcohol, tobacco or illicit drugs  Current medications and supplements including opioid prescriptions. Patient is not currently taking opioid prescriptions. Functional ability and status Nutritional status Physical activity Advanced directives List of other physicians Hospitalizations, surgeries, and ER visits in previous 12 months Vitals Screenings to include cognitive, depression, and falls Referrals and appointments  In addition, I have reviewed and discussed with patient certain preventive protocols, quality metrics, and best practice recommendations. A written personalized care plan for preventive services as well as general preventive health recommendations were provided to patient.     Areatha Beecham, LPN   12/10/2950   After Visit Summary: (In Person-Printed) AVS printed and given to the patient  Notes: Nothing significant to report at this time.

## 2023-08-12 ENCOUNTER — Ambulatory Visit: Admitting: Occupational Therapy

## 2023-08-12 DIAGNOSIS — R278 Other lack of coordination: Secondary | ICD-10-CM

## 2023-08-12 DIAGNOSIS — M6281 Muscle weakness (generalized): Secondary | ICD-10-CM

## 2023-08-12 DIAGNOSIS — R1310 Dysphagia, unspecified: Secondary | ICD-10-CM | POA: Diagnosis not present

## 2023-08-12 DIAGNOSIS — R41842 Visuospatial deficit: Secondary | ICD-10-CM

## 2023-08-12 DIAGNOSIS — R471 Dysarthria and anarthria: Secondary | ICD-10-CM | POA: Diagnosis not present

## 2023-08-12 DIAGNOSIS — R41840 Attention and concentration deficit: Secondary | ICD-10-CM

## 2023-08-12 DIAGNOSIS — R2681 Unsteadiness on feet: Secondary | ICD-10-CM

## 2023-08-12 NOTE — Therapy (Signed)
 OUTPATIENT OCCUPATIONAL THERAPY NEURO TREATMENT   Patient Name: April Larson MRN: [id number] DOB:[birth year], 73 y.o., female Today's Date: 08/12/2023  PCP: Cleave Curling, MD  REFERRING PROVIDER: Wess Hammed, NP   END OF SESSION:  OT End of Session - 08/12/23 1304     Visit Number 4    Number of Visits 10    Date for OT Re-Evaluation 09/09/23    Authorization Type [employer] Dual, no auth required    OT Start Time 1105    OT Stop Time 1146    OT Time Calculation (min) 41 min    Activity Tolerance Patient tolerated treatment well    Behavior During Therapy WFL for tasks assessed/performed               Past Medical History:  Diagnosis Date   Diabetes mellitus without complication (HCC)    Hypertension    Parkinson disease (HCC)    Substance abuse (HCC)    Past Surgical History:  Procedure Laterality Date   EYE SURGERY     Patient Active Problem List   Diagnosis Date Noted   OSA on CPAP 05/17/2023   Anemia 04/03/2023   Supranuclear palsy (HCC) 04/03/2023   Atypical syncope 03/04/2023   Seizure disorder (HCC) 03/04/2023   Seizure-like activity (HCC) 03/02/2023   Thrombocytosis 12/06/2022   Gait abnormality 12/06/2022   Encounter for annual physical exam 11/24/2022   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   Decreased dorsalis pedis pulse 11/17/2020   Tachycardia 08/11/2020   Cough 08/11/2020   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   Pure hypercholesterolemia 01/23/2020   Vitamin D  deficiency 10/23/2019   Right hip pain 05/15/2018   Estrogen deficiency 05/15/2018   Class 1 obesity due to excess calories with serious comorbidity and body mass index (BMI) of 31.0 to 31.9 in adult 05/15/2018   Diabetes mellitus with stage 2 chronic kidney disease (HCC) 01/11/2018   Benign hypertension with chronic kidney disease, stage II 01/11/2018    ONSET DATE: 07/04/2023 (referral date)  REFERRING DIAG:  R47.1 (ICD-10-CM) - Dysarthria and anarthria   R41.841 (ICD-10-CM) - Cognitive communication deficit  R13.10 (ICD-10-CM) - Dysphagia, unspecified type  G20.C (ICD-10-CM) - Primary parkinsonism (HCC)  R41.842 (ICD-10-CM) - Visuospatial deficit  R29.3 (ICD-10-CM) - Abnormal posture  R41.840 (ICD-10-CM) - Attention and concentration deficit  M62.81 (ICD-10-CM) - Muscle weakness (generalized)   07/04/23 Referral Notes: PSP   THERAPY DIAG:  Other lack of coordination  Unsteadiness on feet  Muscle weakness (generalized)  Visuospatial deficit  Attention and concentration deficit  Rationale for Evaluation and Treatment: Rehabilitation  SUBJECTIVE:   SUBJECTIVE STATEMENT: OT noted pt demo'd improved chin tuck strategy at beginning of session and for duration of session. Pt reported improved ability to see when using chin tuck strategy.   Pt accompanied by: self and brother Rogelia Clarks)  PERTINENT HISTORY: PSP, OSA on CPAP, HTN, DM, hx of seizure/seizure-like activity, hx of substance abuse  PRECAUTIONS: Fall, no driving, To reduce fall risk: SBA or CGA when walking, recommended to stabilize chair when pt completes sit-to-stand/stand-to-sit transitions d/t tendency for retropulsion. Recommended increased v/c secondary visual deficits.  WEIGHT BEARING RESTRICTIONS: No  PAIN:  Are you having pain? No  FALLS: Has patient fallen in last 6 months? Yes. Number of falls 5-6 falls, 1 fall occurred day before yesterday - pt fell back when walking d/t focusing on something else    LIVING ENVIRONMENT: Lives with: lives with their family - brother Lives in: House/apartment Stairs: Yes: External: 3 stairs  steps; bilateral but cannot reach both - previously had a ramp but not at this time Has following equipment at home: Single point cane and Walker - 2 wheeled  PLOF: Patient was last living independently about a year ago (9 months) and also stopped driving about 9 months   PATIENT GOALS: "to be able to pick up stuff"  OBJECTIVE:   Note: Objective measures were completed at Evaluation unless otherwise noted.  HAND DOMINANCE: Right  ADLs: Overall ADLs:  Transfers/ambulation related to ADLs: Eating: difficulty with swallowing, supposed to eat small portions, speeds up when eating Grooming: difficulty with toothbrushing - standard toothbrush UB Dressing: ind with extra time LB Dressing: unable to look down therefore difficult, assistance from family Toileting: ind Bathing: ind for completing washing tasks, shower chair, grab bars - family direct supervision Tub Shower transfers: walk-in shower Equipment: Shower seat with back, Grab bars, and Walk in shower  IADLs: dependent  MOBILITY STATUS: Hx of falls and 2-wheeled walker, "I do freeze"   POSTURE COMMENTS:  Sitting ind  ACTIVITY TOLERANCE: Activity tolerance: Pt reported feeling more tired throughout the day.  FUNCTIONAL OUTCOME MEASURES: PSFS: 1.3    Total score = sum of the activity scores/number of activities Minimum detectable change (90%CI) for average score = 2 points Minimum detectable change (90%CI) for single activity score = 3 points   UPPER EXTREMITY ROM:    Active ROM Right eval Left eval  Shoulder flexion Approx. 90* Approx. 90*  Shoulder abduction    Shoulder adduction    Shoulder extension    Shoulder internal rotation Pt able to reach behind head though pt reported tightness in shoulders, RUE tightness >LUE tightness Pt able to reach behind head though pt reported tightness in shoulders, RUE tightness >LUE tightness  Shoulder external rotation    Elbow flexion [facility] WFL  Elbow extension WFL, v/c for full ext [facility]  Wrist flexion [facility] WFL  Wrist extension [facility] WFL  Wrist ulnar deviation    Wrist radial deviation    Wrist pronation [facility] WFL  Wrist supination WFL WFL  (Blank rows = not tested)  BUE digit flex/ext - WFL, additional v/c for full ext of thumbs  UPPER EXTREMITY MMT:     MMT Right eval Left eval  Shoulder flexion     Shoulder abduction    Shoulder adduction    Shoulder extension    Shoulder internal rotation    Shoulder external rotation    Middle trapezius    Lower trapezius    Elbow flexion    Elbow extension    Wrist flexion    Wrist extension    Wrist ulnar deviation    Wrist radial deviation    Wrist pronation    Wrist supination    (Blank rows = not tested)  HAND FUNCTION: Grip strength: Right: 28.6, 26.6, 20.7 (25.3 lbs average) lbs; Left: 28.6, 27.5, 25.7 (27.3 lbs average)  lbs  COORDINATION: Box and Blocks:  Right 18blocks, Left 26blocks  SENSATION: Pt denied numbness/tingling  EDEMA: None noted  MUSCLE TONE: RUE: Within functional limits and LUE: Within functional limits  COGNITION: Overall cognitive status: Impaired Decreased insight into deficits and decreased safety awareness based on observation and per pt's sister report.   VISION: Subjective report: Pt's sister reported pt frequently looks up, fixed gaze and no peripheral vision.  Baseline vision: Wears glasses all the time for distance vision.  Visual history: None noted, had corrective cataracts surgery both eyes  VISION ASSESSMENT: OT noted pt demo'd  reduced eye blinking and often demo'd fixed upward gaze. Pt sometimes changed direction of fixed gaze towards a task or towards a person if another person was talking. However, pt generally unable to focus directly on task or directly on person talking.   PERCEPTION: Not tested  PRAXIS: Not tested  OBSERVATIONS: Pt was pleasant. Pt accompanied by family member. Pt demo'd fixed upward gaze though able to sometimes deviate gaze during conversation to attempt eye contact or to partially look at task. Pt demo'd decreased safety awareness and decreased insight into deficits. Pt benefited from SBA to CGA and gait belt during ambulation with 2WW to reduce fall risk. Pt benefited from v/c to keep walker close to pt and to keep walker on ground. Pt benefited from continuous  v/c to navigate environment and to complete sit-to-stand/stand-to-sit transfers. Pt benefited from OT stabilizing chair when completing sit-to-stand/stand-to-sit transfers d/t pt demo'ing retropulsion when sitting down.                                                                                                                            TREATMENT DATE:   Self-Care OT provided v/c and education regarding vision scanning strategies and Safety with sit-to-stand/stand-to-sit and ambulation (e.g. chin tuck, keeping walker on floor, using arms to lower self to chair). Pt acknowledged understanding though benefited from v/c and OT stabilizing chair when pt completed stand-to-sit/sit-to-stand transitions.  Pt's brother and OT noted pt's Nail of R thumb coming off with acrylic false nails when pt attempted to lift objects from table using R thumb. Pt's brother reported this is common and pt's natural nails are damaged. OT assessed site and noted natural nail appeared to be lifting along with acrylic nail. OT provided pt with bandage to help hold nail in place during session to prevent damage to natural nail and nailbed.  OT educated pt and family on impact of long nails on FM coordination. OT educated pt and family on A/E option of using tabletop easel or elevated surface for visual scanning tasks at home. Pt acknowledged understanding.  TherAct: HEP: FM coordination exercises with BUE - to improve BUE FM coordination and dexterity. Handout provided, see pt instructions.  -Tennis ball: Rotating tennis ball in fingertips clockwise and counter-clockwise. -Stacking towers of checkers (5-6 checkers of height) -Cards: On tabletop, flip cards over 1-at-a-time (try not to slide to the edge of the table). Task graded down to taking card off top of deck and turning over 1-at-a-time and then return d/t pt demo'ing tendency to slide cards off table. -Pen Tricks: rolling pen in fingertips, twirling pen in fingers  both directions.  12-piece puzzle - for Visual scanning, for FM coordination and dexterity, for pattern recognition, sequencing, and cognition. - Pt benefited from elevated surface to assist with vision and required mod to max assist for sequencing and pattern recognition. OT providing pt with 2 choices of puzzle pieces.    PATIENT EDUCATION: Education details: see today's tx above  Person educated: Patient and family member Education method: Explanation Education comprehension: verbalized understanding  HOME EXERCISE PROGRAM: 08/01/23: eating strategies, visual scanning activities on table 08/08/23 - Theraputty HEP (Access Code: [email or web address]) 08/12/23 - FM coordination HEP  GOALS: Goals reviewed with patient? Yes  SHORT TERM GOALS: Target date: 07/25/23  Pt and caregiver will return demo of BUE HEP using visual handouts and no more than min v/c. Baseline: Pt and family reported pt does not have HEP from previous OT POC a few years ago. Goal status: in progress  2.  Pt and caregiver will verbalize understanding of A/E and adaptive strategies for navigating phone safely.  Baseline: Pt and family reported difficulty with navigating phone. Pt's family expressed concerns that pt has too many apps on phone. 08/08/23 - Pt reported continuing to look into phone options.  Goal status: in progress  3.  Pt and caregiver will verbalize understanding of vision compensation strategies and environmental adaptations to promote safety in home environment for navigation during ambulation.  Baseline: Visual deficits, decreased insights into deficits 08/08/23 - Pt recalled vision strategy of "chin tuck" with mod v/c. OT educated pt on additional vision strategies. Pt and family acknowledged understanding. Goal status: in progress   LONG TERM GOALS: Target date: 09/09/23 (per 05/06/28 re-cert)  Pt will maintain BUE grip strength as needed to maintain grasp of toothbrush and eating utensils. Baseline: Grip strength:  Right: 28.6, 26.6, 20.7 (25.3 lbs average) lbs; Left: 28.6, 27.5, 25.7 (27.3 lbs average)  lbs 08/08/23 - RUE: 25.3 lbs. LUE: 23.8 lbs.  (Maintained RUE, slightly decreased on LUE). Goal status: in progress  2.  Pt will maintain FM coordination as evidenced by maintaining box&blocks score of BUE. Baseline: Box and Blocks:  Right 18blocks, Left 26blocks 08/08/23 - RUE: 14 blocks. LUE: 26 blocks (maintained LUE, slight decrease for RUE) Goal status: in progress   3.  Pt and caregiver will verbalize understanding of adaptive strategies and A/E options to promote safety and decrease fall risk during ADL tasks. Baseline: Pt demo'd decreased insight into deficits, decreased safety awareness. Pt and family reported difficulty with holding toothbrush, completing LB dressing, picking up small items, controlling speed of eating.  08/08/23 - Pt's family reported intent to purchase electric toothbrush later today. Pt and family reported improved ability to control speed of eating and using chin tuck strategy to decrease instances of coughing. Goal status: in progress  4.  Patient will report at least two-point increase in average PSFS score or at least three-point increase in a single activity score indicating functionally significant improvement given minimum detectable change using A/E and adaptive strategies PRN. Baseline: PSFS: 1.3 total score (See above for individual activity scores)  Goal status: in progress  ASSESSMENT:  CLINICAL IMPRESSION: Patient seen today for first occupational therapy treatment (eval on 07/11/23) for PSP and associated symptoms. Hx includes PSP, OSA on CPAP, HTN, DM, hx of seizure/seizure-like activity, hx of substance abuse. Patient currently presents below baseline level of functioning demonstrating functional deficits and impairments as noted below. Pt tolerated tasks fairly well though continuing to require v/c for safety. Pt demo'ing improved attention to "chin tuck" strategy for  visual scanning. Pt demo'd difficulty with puzzle task secondary to vision and cognitive/sequencing deficits. Pt benefited from A/E (elevated table surface) and OT assistance to improve participation in puzzle task. The patient requires the skill of OT to help with ongoing exercise and mobility to prevent rapid and significant decline in functional mobility, strength, and ADL function. Pt  and caregivers would also benefit from education regarding safety, A/E, and adaptive strategies in the home environment. Therefore, skilled maintenance OT is recommended for this patient.  PERFORMANCE DEFICITS: in functional skills including ADLs, IADLs, coordination, dexterity, proprioception, sensation, ROM, strength, flexibility, Fine motor control, Gross motor control, mobility, balance, body mechanics, endurance, decreased knowledge of precautions, decreased knowledge of use of DME, vision, and UE functional use, cognitive skills including attention, energy/drive, learn, memory, problem solving, safety awareness, and sequencing, and psychosocial skills including environmental adaptation.   IMPAIRMENTS: are limiting patient from ADLs, IADLs, rest and sleep, leisure, and social participation.   CO-MORBIDITIES: has co-morbidities such as PSP that affects occupational performance. Patient will benefit from skilled OT to address above impairments and improve overall function.  MODIFICATION OR ASSISTANCE TO COMPLETE EVALUATION: Maximum or significant modification of tasks or assist is necessary to complete an evaluation.  OT OCCUPATIONAL PROFILE AND HISTORY: Detailed assessment: Review of records and additional review of physical, cognitive, psychosocial history related to current functional performance.  CLINICAL DECISION MAKING: High - multiple treatment options, significant modification of task necessary  REHAB POTENTIAL: Fair d/t progression of PSP  EVALUATION COMPLEXITY: High    PLAN:  OT FREQUENCY:  2x/week  OT DURATION: 4 weeks + ADDITIONAL 2X PER WEEK FOR 4 WEEKS (per 4/0/98 re-cert)  PLANNED INTERVENTIONS: 11914 OT Re-evaluation, 97535 self care/ADL training, 78295 therapeutic exercise, 97530 therapeutic activity, 97112 neuromuscular re-education, 97140 manual therapy, 97035 ultrasound, 97018 paraffin, 62130 fluidotherapy, 97010 moist heat, 97010 cryotherapy, 97760 Orthotics management and training, 86578 Splinting (initial encounter), [id number] Subsequent splinting/medication, passive range of motion, functional mobility training, visual/perceptual remediation/compensation, energy conservation, patient/family education, and DME and/or AE instructions  RECOMMENDED OTHER SERVICES: PT eval already completed. SLP referral placed.  CONSULTED AND AGREED WITH PLAN OF CARE: Patient  PLAN FOR NEXT SESSION:  Follow up on eating strategies and other recommendations - how is it going? Did pt get electric toothbrush? New phone with accessibility options? Review HEP PRN Visual scanning exercises - e.g. tabletop level or use of elevated surface   Oakley Bellman, OT 08/12/2023, 1:13 PM

## 2023-08-12 NOTE — Patient Instructions (Signed)
 Fine Motor Coordination Home Exercises  Tennis ball: Rotate tennis ball in fingertips clockwise and counter-clockwise - 1 minute on each hand, reverse direction halfway through.  Stacking towers of checkers (5-6 checkers of height) Cards: Place deck on tabletop, turn over 1 card at-a-time and then return. Pen Tricks:  Rolling pen in fingertips Twirling pen in fingers both directions.

## 2023-08-15 ENCOUNTER — Ambulatory Visit: Admitting: Occupational Therapy

## 2023-08-15 ENCOUNTER — Ambulatory Visit

## 2023-08-15 DIAGNOSIS — R278 Other lack of coordination: Secondary | ICD-10-CM

## 2023-08-15 DIAGNOSIS — R471 Dysarthria and anarthria: Secondary | ICD-10-CM | POA: Diagnosis not present

## 2023-08-15 DIAGNOSIS — R41841 Cognitive communication deficit: Secondary | ICD-10-CM

## 2023-08-15 DIAGNOSIS — R2681 Unsteadiness on feet: Secondary | ICD-10-CM

## 2023-08-15 DIAGNOSIS — R1310 Dysphagia, unspecified: Secondary | ICD-10-CM | POA: Diagnosis not present

## 2023-08-15 DIAGNOSIS — M6281 Muscle weakness (generalized): Secondary | ICD-10-CM | POA: Diagnosis not present

## 2023-08-15 DIAGNOSIS — R41840 Attention and concentration deficit: Secondary | ICD-10-CM

## 2023-08-15 DIAGNOSIS — R41842 Visuospatial deficit: Secondary | ICD-10-CM

## 2023-08-15 NOTE — Patient Instructions (Addendum)
What to do when you can't find your word(s)  Take a deep breath to remain calm Try to describe it  Use a different word that relays the same meaning Let your listener know you need help  Use gestures Try writing it Think through the alphabet to see if a letter sparks you thinking of the word  Describing words  What group does it belong to?  What do I use it for?  Where can I find it?  What does it LOOK like?  What other words go with it?  What is the 1st sound of the word?

## 2023-08-15 NOTE — Therapy (Signed)
 OUTPATIENT SPEECH LANGUAGE PATHOLOGY TREATMENT   Patient Name: April Larson MRN: [id number] DOB:1950/09/19, 73 y.o., female Today's Date: 08/15/2023  PCP: Cleave Curling MD REFERRING PROVIDER: Wess Hammed, NP  END OF SESSION:  End of Session - 08/15/23 1129     Visit Number 3    Number of Visits 13    Date for SLP Re-Evaluation 09/26/23    Authorization Type [employer] - auth requested today via email    SLP Start Time 1145    SLP Stop Time  1230    SLP Time Calculation (min) 45 min    Activity Tolerance Patient tolerated treatment well               Past Medical History:  Diagnosis Date   Diabetes mellitus without complication (HCC)    Hypertension    Parkinson disease (HCC)    Substance abuse (HCC)    Past Surgical History:  Procedure Laterality Date   EYE SURGERY     Patient Active Problem List   Diagnosis Date Noted   OSA on CPAP 05/17/2023   Anemia 04/03/2023   Supranuclear palsy (HCC) 04/03/2023   Atypical syncope 03/04/2023   Seizure disorder (HCC) 03/04/2023   Seizure-like activity (HCC) 03/02/2023   Thrombocytosis 12/06/2022   Gait abnormality 12/06/2022   Encounter for annual physical exam 11/24/2022   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   Decreased dorsalis pedis pulse 11/17/2020   Tachycardia 08/11/2020   Cough 08/11/2020   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   Pure hypercholesterolemia 01/23/2020   Vitamin D  deficiency 10/23/2019   Right hip pain 05/15/2018   Estrogen deficiency 05/15/2018   Class 1 obesity due to excess calories with serious comorbidity and body mass index (BMI) of 31.0 to 31.9 in adult 05/15/2018   Diabetes mellitus with stage 2 chronic kidney disease (HCC) 01/11/2018   Benign hypertension with chronic kidney disease, stage II 01/11/2018    ONSET DATE: 07/04/2023 (referral date)   REFERRING DIAG: R47.1 (ICD-10-CM) - Dysarthria and anarthria R13.10 (ICD-10-CM) - Dysphagia, unspecified type  G20.C (ICD-10-CM) - Primary parkinsonism (HCC) R41.842 (ICD-10-CM) - Visuospatial deficit  THERAPY DIAG:  Cognitive communication deficit  Dysarthria and anarthria  Dysphagia, unspecified type  Rationale for Evaluation and Treatment: Rehabilitation  SUBJECTIVE:   SUBJECTIVE STATEMENT: " Pt accompanied by: family member  PERTINENT HISTORY: PSP, OSA on CPAP, HTN, DM, hx of seizure/seizure-like activity, hx of substance abuse   PAIN: Are you having pain? No  FALLS: Has patient fallen in last 6 months?  Yes, See PT evaluation for details  LIVING ENVIRONMENT: Lives with: lives with their family - now lives with brother Lives in: House/apartment  PLOF:  Level of assistance: Needed assistance with ADLs, Needed assistance with IADLS Employment: Retired  PATIENT GOALS: improve overall functioning  OBJECTIVE:  Note: Objective measures were completed at Evaluation unless otherwise noted.    TREATMENT DATE:  08/15/23: Reported "less coughing" with trained swallow precautions. Sister affirmed positive benefit and carryover of swallow precautions. Endorsed decline in communication secondary to word retrieval deficits. Pt provided example of anomia for "putty" at home. Administered [employer] with anomia evidenced x10 and dysnomia x2 (self-corrected both). Initiated education and instruction of word retrieval strategies and SFA for description strategy. Usual questioning cues required to generate appropriate description. Pt able to carryover "normal" volume with occasional min A.   08/08/23: Reviewed recommended swallow strategies provided at eval. Pt able to recall 1/4 strategies independently. Provided handout with recommendations. Endorsed improved swallow function  with minimizing distractions and taking smaller bites. Targeted functional problem solving, with usual fading to occasional mod A to ID deficit/breakdown and potential solutions. Endorsed reduced verbal output related to  increased volume. Conversational volume averaged low 80s dB. Targeted volume modification, with pt able to maintain WNL conversational volume with occasional min A.   08/01/23: ST evaluation and POC completed. Based on family's concern for dysphagia, particularly coughing with solids, completed clincial bedside swallow evaluation. Evidenced decreased attention to task and reduced oral awareness resulting in mild-mod oral residue. Provided recommendations for increased swallow safety, including small bites, avoid overfilling mouth, eye gaze forward for neutral head positioning, and minimizing distractions (ex: sitting at table versus in front of TV). Pt verbalized understanding and pt able to teach back recommendations x1 independently and others listed with occasional mod A.   PATIENT EDUCATION: Education details: see above Person educated: Patient and Sibling Education method: Explanation Education comprehension: verbalized understanding and needs further education   GOALS: Goals reviewed with patient? Yes  SHORT TERM GOALS: Target date: 08/29/2023  Pt will teach back and demonstrate recommended swallow strategies given occasional min A Baseline: Goal status: MET  2.  Pt will demonstrate adequate functional problem solving for 3/4 targeted scenarios given occasional min A  Baseline:  Goal status: ONGOING  3. Pt will employ attention strategies to optimize focus during targeted tasks/conversations given occasional min A Baseline:  Goal status: ONGOING  4.   Pt will utilize speech strategies to optimize communication effectiveness in 3/4 opportunities given occasional min A Baseline:  Goal status: ONGOING   LONG TERM GOALS: Target date: 09/26/2023  Pt will successfully carryover recommended swallow strategies at home > 1 week  Baseline:  Goal status: ONGOING  2.  Family will subjectively report improved attention and problem solving at home with use of trained techniques   Baseline:  Goal status: ONGOING  3.  Pt will subjectively report success using speech strategies to optimize communication effectiveness in 2 opportunities at home  Baseline:  Goal status: ONGOING  4.  Pt will complete objective swallow study to evaluate pharyngeal function and aspiration risk as needed Baseline:  Goal status: ONGOING   ASSESSMENT:  CLINICAL IMPRESSION: Patient is a 73 y.o. F who was seen today for ST evaluation for PSP. Exhibits mild decline in cognition, including reduced problem solving and decreased selective/alternating/divided attention. Pt endorsed speech changes at evaluation but appears baseline in conversation per previous intervention (affirmed by sister). Word retrieval deficits indicated today, which SLP addressed. Presents with varied volume and intermittent pausing for thought formation. Swallow function reportedly improved with trained strategies. Suspect dysphagia reports largely related to cognitive component but may refer for MBSS if warranted. Pt would benefit from skilled ST intervention to optimize overall functioning and safety.   OBJECTIVE IMPAIRMENTS: include attention, memory, awareness, executive functioning, dysarthria, and dysphagia. These impairments are limiting patient from household responsibilities, ADLs/IADLs, effectively communicating at home and in community, and safety when swallowing. Factors affecting potential to achieve goals and functional outcome are ability to learn/carryover information, co-morbidities, medical prognosis, and previous level of function. Patient will benefit from skilled SLP services to address above impairments and improve overall function.  REHAB POTENTIAL: Good  PLAN:  SLP FREQUENCY: 2x/week  SLP DURATION: 8 weeks (extended for scheduling)  PLANNED INTERVENTIONS: Aspiration precaution training, Pharyngeal strengthening exercises, Diet toleration management , Environmental controls, Cueing hierachy,  Cognitive reorganization, Internal/external aids, Functional tasks, Multimodal communication approach, SLP instruction and feedback, Compensatory strategies, Patient/family education, [phone] Treatment  of speech (30 or 45 min) , [id number] Treatment of swallowing function, and [id number]- Speech [address], [place], [place], Eval Compre, Express    Tamar Fairly, CCC-SLP 08/15/2023, 11:29 AM

## 2023-08-15 NOTE — Therapy (Signed)
 OUTPATIENT OCCUPATIONAL THERAPY NEURO TREATMENT   Patient Name: April Larson MRN: [id number] DOB:01-15-1951, 73 y.o., female Today's Date: 08/15/2023  PCP: Cleave Curling, MD  REFERRING PROVIDER: Wess Hammed, NP   END OF SESSION:  OT End of Session - 08/15/23 1143     Visit Number 5    Number of Visits 10    Date for OT Re-Evaluation 09/09/23    Authorization Type [employer] Dual, no auth required    OT Start Time 1104    OT Stop Time 1144    OT Time Calculation (min) 40 min    Activity Tolerance Patient tolerated treatment well    Behavior During Therapy WFL for tasks assessed/performed                Past Medical History:  Diagnosis Date   Diabetes mellitus without complication (HCC)    Hypertension    Parkinson disease (HCC)    Substance abuse (HCC)    Past Surgical History:  Procedure Laterality Date   EYE SURGERY     Patient Active Problem List   Diagnosis Date Noted   OSA on CPAP 05/17/2023   Anemia 04/03/2023   Supranuclear palsy (HCC) 04/03/2023   Atypical syncope 03/04/2023   Seizure disorder (HCC) 03/04/2023   Seizure-like activity (HCC) 03/02/2023   Thrombocytosis 12/06/2022   Gait abnormality 12/06/2022   Encounter for annual physical exam 11/24/2022   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   Decreased dorsalis pedis pulse 11/17/2020   Tachycardia 08/11/2020   Cough 08/11/2020   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   Pure hypercholesterolemia 01/23/2020   Vitamin D  deficiency 10/23/2019   Right hip pain 05/15/2018   Estrogen deficiency 05/15/2018   Class 1 obesity due to excess calories with serious comorbidity and body mass index (BMI) of 31.0 to 31.9 in adult 05/15/2018   Diabetes mellitus with stage 2 chronic kidney disease (HCC) 01/11/2018   Benign hypertension with chronic kidney disease, stage II 01/11/2018    ONSET DATE: 07/04/2023 (referral date)  REFERRING DIAG:  R47.1 (ICD-10-CM) - Dysarthria and anarthria   R41.841 (ICD-10-CM) - Cognitive communication deficit  R13.10 (ICD-10-CM) - Dysphagia, unspecified type  G20.C (ICD-10-CM) - Primary parkinsonism (HCC)  R41.842 (ICD-10-CM) - Visuospatial deficit  R29.3 (ICD-10-CM) - Abnormal posture  R41.840 (ICD-10-CM) - Attention and concentration deficit  M62.81 (ICD-10-CM) - Muscle weakness (generalized)   07/04/23 Referral Notes: PSP   THERAPY DIAG:  Other lack of coordination  Unsteadiness on feet  Muscle weakness (generalized)  Visuospatial deficit  Attention and concentration deficit  Rationale for Evaluation and Treatment: Rehabilitation  SUBJECTIVE:   SUBJECTIVE STATEMENT: Pt and family reported no acute changes or updates. Pt's sister reported intent to purchase electric toothbrush after session today.  Pt accompanied by: self and sister([name])  PERTINENT HISTORY: PSP, OSA on CPAP, HTN, DM, hx of seizure/seizure-like activity, hx of substance abuse  PRECAUTIONS: Fall, no driving, To reduce fall risk: SBA or CGA when walking, recommended to stabilize chair when pt completes sit-to-stand/stand-to-sit transitions d/t tendency for retropulsion. Recommended increased v/c secondary visual deficits.  WEIGHT BEARING RESTRICTIONS: No  PAIN:  Are you having pain? No  FALLS: Has patient fallen in last 6 months? Yes. Number of falls 5-6 falls, 1 fall occurred day before yesterday - pt fell back when walking d/t focusing on something else    LIVING ENVIRONMENT: Lives with: lives with their family - brother Lives in: House/apartment Stairs: Yes: External: 3 stairs steps; bilateral but cannot reach both - previously had  a ramp but not at this time Has following equipment at home: Single point cane and Walker - 2 wheeled  PLOF: Patient was last living independently about a year ago (9 months) and also stopped driving about 9 months   PATIENT GOALS: "to be able to pick up stuff"  OBJECTIVE:  Note: Objective measures were completed at  Evaluation unless otherwise noted.  HAND DOMINANCE: Right  ADLs: Overall ADLs:  Transfers/ambulation related to ADLs: Eating: difficulty with swallowing, supposed to eat small portions, speeds up when eating Grooming: difficulty with toothbrushing - standard toothbrush UB Dressing: ind with extra time LB Dressing: unable to look down therefore difficult, assistance from family Toileting: ind Bathing: ind for completing washing tasks, shower chair, grab bars - family direct supervision Tub Shower transfers: walk-in shower Equipment: Shower seat with back, Grab bars, and Walk in shower  IADLs: dependent  MOBILITY STATUS: Hx of falls and 2-wheeled walker, "I do freeze"   POSTURE COMMENTS:  Sitting ind  ACTIVITY TOLERANCE: Activity tolerance: Pt reported feeling more tired throughout the day.  FUNCTIONAL OUTCOME MEASURES: PSFS: 1.3    Total score = sum of the activity scores/number of activities Minimum detectable change (90%CI) for average score = 2 points Minimum detectable change (90%CI) for single activity score = 3 points   08/15/23 - 5.3    UPPER EXTREMITY ROM:    Active ROM Right eval Left eval  Shoulder flexion Approx. 90* Approx. 90*  Shoulder abduction    Shoulder adduction    Shoulder extension    Shoulder internal rotation Pt able to reach behind head though pt reported tightness in shoulders, RUE tightness >LUE tightness Pt able to reach behind head though pt reported tightness in shoulders, RUE tightness >LUE tightness  Shoulder external rotation    Elbow flexion [facility] WFL  Elbow extension WFL, v/c for full ext [facility]  Wrist flexion [facility] WFL  Wrist extension [facility] WFL  Wrist ulnar deviation    Wrist radial deviation    Wrist pronation [facility] WFL  Wrist supination WFL WFL  (Blank rows = not tested)  BUE digit flex/ext - WFL, additional v/c for full ext of thumbs  UPPER EXTREMITY MMT:     MMT Right eval Left eval  Shoulder flexion    Shoulder abduction     Shoulder adduction    Shoulder extension    Shoulder internal rotation    Shoulder external rotation    Middle trapezius    Lower trapezius    Elbow flexion    Elbow extension    Wrist flexion    Wrist extension    Wrist ulnar deviation    Wrist radial deviation    Wrist pronation    Wrist supination    (Blank rows = not tested)  HAND FUNCTION: Grip strength: Right: 28.6, 26.6, 20.7 (25.3 lbs average) lbs; Left: 28.6, 27.5, 25.7 (27.3 lbs average)  lbs  COORDINATION: Box and Blocks:  Right 18blocks, Left 26blocks  SENSATION: Pt denied numbness/tingling  EDEMA: None noted  MUSCLE TONE: RUE: Within functional limits and LUE: Within functional limits  COGNITION: Overall cognitive status: Impaired Decreased insight into deficits and decreased safety awareness based on observation and per pt's sister report.   VISION: Subjective report: Pt's sister reported pt frequently looks up, fixed gaze and no peripheral vision.  Baseline vision: Wears glasses all the time for distance vision.  Visual history: None noted, had corrective cataracts surgery both eyes  VISION ASSESSMENT: OT noted pt demo'd reduced eye blinking  and often demo'd fixed upward gaze. Pt sometimes changed direction of fixed gaze towards a task or towards a person if another person was talking. However, pt generally unable to focus directly on task or directly on person talking.   PERCEPTION: Not tested  PRAXIS: Not tested  OBSERVATIONS: Pt was pleasant. Pt accompanied by family member. Pt demo'd fixed upward gaze though able to sometimes deviate gaze during conversation to attempt eye contact or to partially look at task. Pt demo'd decreased safety awareness and decreased insight into deficits. Pt benefited from SBA to CGA and gait belt during ambulation with 2WW to reduce fall risk. Pt benefited from v/c to keep walker close to pt and to keep walker on ground. Pt benefited from continuous v/c to navigate  environment and to complete sit-to-stand/stand-to-sit transfers. Pt benefited from OT stabilizing chair when completing sit-to-stand/stand-to-sit transfers d/t pt demo'ing retropulsion when sitting down.                                                                                                                            TREATMENT DATE:   Self-Care OT provided v/c and education regarding vision scanning strategies and Safety with sit-to-stand/stand-to-sit and ambulation (e.g. chin tuck, keeping walker on floor, using arms to lower self to chair, using arms of chair to push up from chair for standing). Pt acknowledged understanding though benefited from v/c and OT stabilizing chair when pt completed stand-to-sit/sit-to-stand transitions.  OT educated pt and pt's sister on nail care d/t pt's thumb false nail lifting from nail bed last session. OT noted pt had bandage over nail of thumb to anchor nail today. Pt's family reported pt does not have any natural nails and concerned about infection. OT educated pt and family to closely monitor sites to ensure no infection. Pt and family acknowledged understanding.   TherAct OT assessed pt's progress towards goals, see below for updates.   Turning puzzle pieces over, then placing alphabet pieces in alphabetical order L to R, then placing pieces in puzzle template - on elevated surface to improve visual scanning, with min v/c and tactile cues, with extra time - to improve FM coordination and dexterity, to improve visual scanning, for sequencing.   Placing/removing large pegs following designated pattern - to improve FM coordination and dexterity, to improve visual scanning, for sequencing and pattern recognition.   PATIENT EDUCATION: Education details: see today's tx above Person educated: Patient and family member Education method: Explanation Education comprehension: verbalized understanding  HOME EXERCISE PROGRAM: 08/01/23: eating strategies,  visual scanning activities on table 08/08/23 - Theraputty HEP (Access Code: [email or web address]) 08/12/23 - FM coordination HEP  GOALS: Goals reviewed with patient? Yes  SHORT TERM GOALS: Target date: 07/25/23  Pt and caregiver will return demo of BUE HEP using visual handouts and no more than min v/c. Baseline: Pt and family reported pt does not have HEP from previous OT POC a few years ago. Goal status: in progress  2.  Pt and caregiver will verbalize understanding of A/E and adaptive strategies for navigating phone safely.  Baseline: Pt and family reported difficulty with navigating phone. Pt's family expressed concerns that pt has too many apps on phone. 08/08/23 - Pt reported continuing to look into phone options.  Goal status: in progress  3.  Pt and caregiver will verbalize understanding of vision compensation strategies and environmental adaptations to promote safety in home environment for navigation during ambulation.  Baseline: Visual deficits, decreased insights into deficits 08/08/23 - Pt recalled vision strategy of "chin tuck" with mod v/c. OT educated pt on additional vision strategies. Pt and family acknowledged understanding. Goal status: in progress   LONG TERM GOALS: Target date: 09/09/23 (per 0/4/54 re-cert)  Pt will maintain BUE grip strength as needed to maintain grasp of toothbrush and eating utensils. Baseline: Grip strength: Right: 28.6, 26.6, 20.7 (25.3 lbs average) lbs; Left: 28.6, 27.5, 25.7 (27.3 lbs average)  lbs 08/08/23 - RUE: 25.3 lbs. LUE: 23.8 lbs.  (Maintained RUE, slightly decreased on LUE). Goal status: in progress  2.  Pt will maintain FM coordination as evidenced by maintaining box&blocks score of BUE. Baseline: Box and Blocks:  Right 18blocks, Left 26blocks 08/08/23 - RUE: 14 blocks. LUE: 26 blocks (maintained LUE, slight decrease for RUE) Goal status: in progress   3.  Pt and caregiver will verbalize understanding of adaptive strategies and A/E options to promote  safety and decrease fall risk during ADL tasks. Baseline: Pt demo'd decreased insight into deficits, decreased safety awareness. Pt and family reported difficulty with holding toothbrush, completing LB dressing, picking up small items, controlling speed of eating.  08/08/23 - Pt's family reported intent to purchase electric toothbrush later today. Pt and family reported improved ability to control speed of eating and using chin tuck strategy to decrease instances of coughing. 08/15/23 - Pt and family reported no questions/concerns related to safety and A/E options reviewed at previous OT sessions.  Goal status: MET  4.  Patient will report at least two-point increase in average PSFS score or at least three-point increase in a single activity score indicating functionally significant improvement given minimum detectable change using A/E and adaptive strategies PRN. Baseline: PSFS: 1.3 total score (See above for individual activity scores)  08/15/23 - PSFS = 5.3 Goal status: MET  ASSESSMENT:  CLINICAL IMPRESSION: Patient seen today for occupational therapy treatment (eval on 07/11/23) for PSP and associated symptoms. Hx includes PSP, OSA on CPAP, HTN, DM, hx of seizure/seizure-like activity, hx of substance abuse. Pt met 2 LTG today, demo'ing improved functional participation in daily tasks with attention to safety and visual scanning strategies. Pt continuing to benefit from cueing for processing and problem-solving aspects of functional tasks though pt did well today with recognizing familiar letters of alphabet and color patterns for FM tasks. Pt benefits from elevated surface for functional tasks to improve visual scanning.  The patient requires the skill of OT to help with ongoing exercise and mobility to prevent rapid and significant decline in functional mobility, strength, and ADL function. Pt and caregivers would also benefit from education regarding safety, A/E, and adaptive strategies in the home  environment. Therefore, skilled maintenance OT is recommended for this patient.  PERFORMANCE DEFICITS: in functional skills including ADLs, IADLs, coordination, dexterity, proprioception, sensation, ROM, strength, flexibility, Fine motor control, Gross motor control, mobility, balance, body mechanics, endurance, decreased knowledge of precautions, decreased knowledge of use of DME, vision, and UE functional use, cognitive skills including attention, energy/drive, learn, memory, problem  solving, safety awareness, and sequencing, and psychosocial skills including environmental adaptation.   IMPAIRMENTS: are limiting patient from ADLs, IADLs, rest and sleep, leisure, and social participation.   CO-MORBIDITIES: has co-morbidities such as PSP that affects occupational performance. Patient will benefit from skilled OT to address above impairments and improve overall function.  MODIFICATION OR ASSISTANCE TO COMPLETE EVALUATION: Maximum or significant modification of tasks or assist is necessary to complete an evaluation.  OT OCCUPATIONAL PROFILE AND HISTORY: Detailed assessment: Review of records and additional review of physical, cognitive, psychosocial history related to current functional performance.  CLINICAL DECISION MAKING: High - multiple treatment options, significant modification of task necessary  REHAB POTENTIAL: Fair d/t progression of PSP  EVALUATION COMPLEXITY: High    PLAN:  OT FREQUENCY: 2x/week  OT DURATION: 4 weeks + ADDITIONAL 2X PER WEEK FOR 4 WEEKS (per 4/0/98 re-cert)  PLANNED INTERVENTIONS: 11914 OT Re-evaluation, 97535 self care/ADL training, 78295 therapeutic exercise, 97530 therapeutic activity, 97112 neuromuscular re-education, 97140 manual therapy, 97035 ultrasound, 97018 paraffin, 62130 fluidotherapy, 97010 moist heat, 97010 cryotherapy, 97760 Orthotics management and training, 86578 Splinting (initial encounter), [id number] Subsequent splinting/medication, passive range  of motion, functional mobility training, visual/perceptual remediation/compensation, energy conservation, patient/family education, and DME and/or AE instructions  RECOMMENDED OTHER SERVICES: PT eval already completed. SLP referral placed.  CONSULTED AND AGREED WITH PLAN OF CARE: Patient  PLAN FOR NEXT SESSION:  Continue to check-in on goals Continue to focus on safety with stand-to-sit/sit-to-stand Did pt get electric toothbrush? New phone with accessibility options? Review HEP PRN Visual scanning exercises - e.g. tabletop level or use of elevated surface   Oakley Bellman, OT 08/15/2023, 11:51 AM

## 2023-08-19 ENCOUNTER — Ambulatory Visit: Admitting: Occupational Therapy

## 2023-08-19 ENCOUNTER — Ambulatory Visit: Admitting: Speech Pathology

## 2023-08-19 DIAGNOSIS — R471 Dysarthria and anarthria: Secondary | ICD-10-CM

## 2023-08-19 DIAGNOSIS — R1310 Dysphagia, unspecified: Secondary | ICD-10-CM

## 2023-08-19 DIAGNOSIS — R2681 Unsteadiness on feet: Secondary | ICD-10-CM | POA: Diagnosis not present

## 2023-08-19 DIAGNOSIS — M6281 Muscle weakness (generalized): Secondary | ICD-10-CM | POA: Diagnosis not present

## 2023-08-19 DIAGNOSIS — R41841 Cognitive communication deficit: Secondary | ICD-10-CM

## 2023-08-19 DIAGNOSIS — R278 Other lack of coordination: Secondary | ICD-10-CM | POA: Diagnosis not present

## 2023-08-19 NOTE — Therapy (Signed)
 OUTPATIENT SPEECH LANGUAGE PATHOLOGY TREATMENT   Patient Name: April Larson [place] MRN: [id number] DOB:July 11, 1950, 73 y.o., female Today's Date: 08/19/2023  PCP: Cleave Curling MD REFERRING PROVIDER: Wess Hammed, NP  END OF SESSION:  End of Session - 08/19/23 1108     Visit Number 4    Number of Visits 13    Date for SLP Re-Evaluation 09/26/23    Authorization Type [employer] - auth requested today via email    SLP Start Time 1103    SLP Stop Time  1145    SLP Time Calculation (min) 42 min    Activity Tolerance Patient tolerated treatment well               Past Medical History:  Diagnosis Date   Diabetes mellitus without complication (HCC)    Hypertension    Parkinson disease (HCC)    Substance abuse (HCC)    Past Surgical History:  Procedure Laterality Date   EYE SURGERY     Patient Active Problem List   Diagnosis Date Noted   OSA on CPAP 05/17/2023   Anemia 04/03/2023   Supranuclear palsy (HCC) 04/03/2023   Atypical syncope 03/04/2023   Seizure disorder (HCC) 03/04/2023   Seizure-like activity (HCC) 03/02/2023   Thrombocytosis 12/06/2022   Gait abnormality 12/06/2022   Encounter for annual physical exam 11/24/2022   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   Decreased dorsalis pedis pulse 11/17/2020   Tachycardia 08/11/2020   Cough 08/11/2020   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   Pure hypercholesterolemia 01/23/2020   Vitamin D  deficiency 10/23/2019   Right hip pain 05/15/2018   Estrogen deficiency 05/15/2018   Class 1 obesity due to excess calories with serious comorbidity and body mass index (BMI) of 31.0 to 31.9 in adult 05/15/2018   Diabetes mellitus with stage 2 chronic kidney disease (HCC) 01/11/2018   Benign hypertension with chronic kidney disease, stage II 01/11/2018    ONSET DATE: 07/04/2023 (referral date)   REFERRING DIAG: R47.1 (ICD-10-CM) - Dysarthria and anarthria R13.10 (ICD-10-CM) - Dysphagia, unspecified type  G20.C (ICD-10-CM) - Primary parkinsonism (HCC) R41.842 (ICD-10-CM) - Visuospatial deficit  THERAPY DIAG:  Cognitive communication deficit  Dysarthria and anarthria  Dysphagia, unspecified type  Rationale for Evaluation and Treatment: Rehabilitation  SUBJECTIVE:   SUBJECTIVE STATEMENT: " Pt accompanied by: family member  PERTINENT HISTORY: PSP, OSA on CPAP, HTN, DM, hx of seizure/seizure-like activity, hx of substance abuse   PAIN: Are you having pain? No  FALLS: Has patient fallen in last 6 months?  Yes, See PT evaluation for details  LIVING ENVIRONMENT: Lives with: lives with their family - now lives with brother Lives in: House/apartment  PLOF:  Level of assistance: Needed assistance with ADLs, Needed assistance with IADLS Employment: Retired  PATIENT GOALS: improve overall functioning  OBJECTIVE:  Note: Objective measures were completed at Evaluation unless otherwise noted.    TREATMENT DATE:  08/19/23: Reviewed SFA as framework for description anomia strategy. Provided model using simple target. SLP then had pt practice utilizing to aid in skill building. Pt provided 4+ salient features of x5 targets with provision of occasional questioning cues, particularly to provide 4th or 5th feature. Discussed pt's daily/weekly routine to determine functional targets for problem solving and implementation of support strategies and techniques for successful participation. Pt is unable to ID any concerns in response to SLP questioning. Using generic scenarios, pt able to complete x2 functional problem solving tasks with challenge to come up with 2 solutions. During conversation regarding personally relevant  and highly salient topic (recovery), pt maintaining attention to conversation, rare (x2) anomia with repair. Quiet environment and interactive nature of conversation likely facilitative. Pt maintaining WFL volume, mild prolonged speech sounds. Intelligibility intact.   Reviewed swallow  strategies with pt demonstrating understanding of reduced distractions, small bites, look forward (neutral position), focus on eating with mod-I. She and sister endorse ongoing improvements in swallowing with use of strategies. Improvements c/b reduced coughing. SLP provided education for intentional bolus collection prior to swallow to address oral residue. Education provided regarding aspiration precautions with pt endorsing daily oral care, self-feeding, out of bed for all PO.   08/15/23: Reported "less coughing" with trained swallow precautions. Sister affirmed positive benefit and carryover of swallow precautions. Endorsed decline in communication secondary to word retrieval deficits. Pt provided example of anomia for "putty" at home. Administered [employer] with anomia evidenced x10 and dysnomia x2 (self-corrected both). Initiated education and instruction of word retrieval strategies and SFA for description strategy. Usual questioning cues required to generate appropriate description. Pt able to carryover "normal" volume with occasional min A.   08/08/23: Reviewed recommended swallow strategies provided at eval. Pt able to recall 1/4 strategies independently. Provided handout with recommendations. Endorsed improved swallow function with minimizing distractions and taking smaller bites. Targeted functional problem solving, with usual fading to occasional mod A to ID deficit/breakdown and potential solutions. Endorsed reduced verbal output related to increased volume. Conversational volume averaged low 80s dB. Targeted volume modification, with pt able to maintain WNL conversational volume with occasional min A.   08/01/23: ST evaluation and POC completed. Based on family's concern for dysphagia, particularly coughing with solids, completed clincial bedside swallow evaluation. Evidenced decreased attention to task and reduced oral awareness resulting in mild-mod oral residue. Provided recommendations  for increased swallow safety, including small bites, avoid overfilling mouth, eye gaze forward for neutral head positioning, and minimizing distractions (ex: sitting at table versus in front of TV). Pt verbalized understanding and pt able to teach back recommendations x1 independently and others listed with occasional mod A.   PATIENT EDUCATION: Education details: see above Person educated: Patient and Sibling Education method: Explanation Education comprehension: verbalized understanding and needs further education   GOALS: Goals reviewed with patient? Yes  SHORT TERM GOALS: Target date: 08/29/2023  Pt will teach back and demonstrate recommended swallow strategies given occasional min A Baseline: 08/19/23 Goal status: MET  2.  Pt will demonstrate adequate functional problem solving for 3/4 targeted scenarios given occasional min A  Baseline:  Goal status: ONGOING  3. Pt will employ attention strategies to optimize focus during targeted tasks/conversations given occasional min A Baseline: 08/19/23 Goal status: ONGOING  4.   Pt will utilize speech strategies to optimize communication effectiveness in 3/4 opportunities given occasional min A Baseline: 08/19/23 Goal status: ONGOING   LONG TERM GOALS: Target date: 09/26/2023  Pt will successfully carryover recommended swallow strategies at home > 1 week  Baseline:  Goal status: ONGOING  2.  Family will subjectively report improved attention and problem solving at home with use of trained techniques  Baseline:  Goal status: ONGOING  3.  Pt will subjectively report success using speech strategies to optimize communication effectiveness in 2 opportunities at home  Baseline:  Goal status: ONGOING  4.  Pt will complete objective swallow study to evaluate pharyngeal function and aspiration risk as needed Baseline:  Goal status: ONGOING   ASSESSMENT:  CLINICAL IMPRESSION: Patient is a 73 y.o. F who was seen today for  ST  evaluation for PSP. Exhibits mild decline in cognition, including reduced problem solving and decreased selective/alternating/divided attention. Pt endorsed speech changes at evaluation but appears baseline in conversation per previous intervention (affirmed by sister). Word retrieval deficits indicated today, which SLP addressed. Presents with varied volume and intermittent pausing for thought formation. Swallow function reportedly improved with trained strategies. Suspect dysphagia reports largely related to cognitive component but may refer for MBSS if warranted. Pt would benefit from skilled ST intervention to optimize overall functioning and safety.   OBJECTIVE IMPAIRMENTS: include attention, memory, awareness, executive functioning, dysarthria, and dysphagia. These impairments are limiting patient from household responsibilities, ADLs/IADLs, effectively communicating at home and in community, and safety when swallowing. Factors affecting potential to achieve goals and functional outcome are ability to learn/carryover information, co-morbidities, medical prognosis, and previous level of function. Patient will benefit from skilled SLP services to address above impairments and improve overall function.  REHAB POTENTIAL: Good  PLAN:  SLP FREQUENCY: 2x/week  SLP DURATION: 8 weeks (extended for scheduling)  PLANNED INTERVENTIONS: Aspiration precaution training, Pharyngeal strengthening exercises, Diet toleration management , Environmental controls, Cueing hierachy, Cognitive reorganization, Internal/external aids, Functional tasks, Multimodal communication approach, SLP instruction and feedback, Compensatory strategies, Patient/family education, [phone] Treatment of speech (30 or 45 min) , [id number] Treatment of swallowing function, and [id number]- Speech [id number] [place], [place], Eval Compre, Express    Alston Jerry, CCC-SLP 08/19/2023, 11:08 AM

## 2023-08-22 ENCOUNTER — Ambulatory Visit

## 2023-08-22 ENCOUNTER — Other Ambulatory Visit: Payer: Self-pay

## 2023-08-22 ENCOUNTER — Other Ambulatory Visit: Payer: Self-pay | Admitting: Internal Medicine

## 2023-08-22 ENCOUNTER — Ambulatory Visit: Admitting: Occupational Therapy

## 2023-08-22 ENCOUNTER — Other Ambulatory Visit (HOSPITAL_COMMUNITY): Payer: Self-pay

## 2023-08-22 VITALS — BP 137/70 | HR 86

## 2023-08-22 DIAGNOSIS — R471 Dysarthria and anarthria: Secondary | ICD-10-CM | POA: Diagnosis not present

## 2023-08-22 DIAGNOSIS — R41841 Cognitive communication deficit: Secondary | ICD-10-CM

## 2023-08-22 DIAGNOSIS — R2681 Unsteadiness on feet: Secondary | ICD-10-CM

## 2023-08-22 DIAGNOSIS — R1310 Dysphagia, unspecified: Secondary | ICD-10-CM

## 2023-08-22 DIAGNOSIS — M6281 Muscle weakness (generalized): Secondary | ICD-10-CM | POA: Diagnosis not present

## 2023-08-22 DIAGNOSIS — R41842 Visuospatial deficit: Secondary | ICD-10-CM

## 2023-08-22 DIAGNOSIS — R41840 Attention and concentration deficit: Secondary | ICD-10-CM

## 2023-08-22 DIAGNOSIS — R278 Other lack of coordination: Secondary | ICD-10-CM | POA: Diagnosis not present

## 2023-08-22 MED ORDER — TELMISARTAN-HCTZ 40-12.5 MG PO TABS
1.0000 | ORAL_TABLET | Freq: Every morning | ORAL | 1 refills | Status: DC
  Filled 2023-08-22 – 2023-09-15 (×4): qty 30, 30d supply, fill #0
  Filled 2023-10-10 – 2023-10-11 (×2): qty 30, 30d supply, fill #1
  Filled 2023-11-17 (×2): qty 30, 30d supply, fill #2
  Filled 2023-12-14 – 2023-12-16 (×2): qty 30, 30d supply, fill #3
  Filled 2024-01-12 (×2): qty 30, 30d supply, fill #4
  Filled 2024-02-13: qty 30, 30d supply, fill #5

## 2023-08-22 MED ORDER — CARBIDOPA-LEVODOPA 25-100 MG PO TABS
1.0000 | ORAL_TABLET | Freq: Four times a day (QID) | ORAL | 2 refills | Status: DC
  Filled 2023-08-22 – 2023-09-15 (×4): qty 120, 30d supply, fill #0
  Filled 2023-10-10: qty 120, 30d supply, fill #1

## 2023-08-22 MED FILL — Omeprazole Cap Delayed Release 40 MG: ORAL | 30 days supply | Qty: 30 | Fill #1 | Status: CN

## 2023-08-22 MED FILL — Atorvastatin Calcium Tab 10 MG (Base Equivalent): ORAL | 30 days supply | Qty: 30 | Fill #5 | Status: CN

## 2023-08-22 NOTE — Therapy (Addendum)
 OUTPATIENT OCCUPATIONAL THERAPY NEURO TREATMENT   Patient Name: April Larson MRN: [id number] DOB:30-Mar-1951, 73 y.o., female Today's Date: 08/22/2023  PCP: Cleave Curling, MD  REFERRING PROVIDER: Wess Hammed, NP   END OF SESSION:  OT End of Session - 08/22/23 1306     Visit Number 6    Number of Visits 10    Date for OT Re-Evaluation 09/09/23    Authorization Type [employer] Dual, no auth required    OT Start Time 1153    OT Stop Time 1231    OT Time Calculation (min) 38 min    Activity Tolerance Patient tolerated treatment well    Behavior During Therapy WFL for tasks assessed/performed                 Past Medical History:  Diagnosis Date   Diabetes mellitus without complication (HCC)    Hypertension    Parkinson disease (HCC)    Substance abuse (HCC)    Past Surgical History:  Procedure Laterality Date   EYE SURGERY     Patient Active Problem List   Diagnosis Date Noted   OSA on CPAP 05/17/2023   Anemia 04/03/2023   Supranuclear palsy (HCC) 04/03/2023   Atypical syncope 03/04/2023   Seizure disorder (HCC) 03/04/2023   Seizure-like activity (HCC) 03/02/2023   Thrombocytosis 12/06/2022   Gait abnormality 12/06/2022   Encounter for annual physical exam 11/24/2022   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   Decreased dorsalis pedis pulse 11/17/2020   Tachycardia 08/11/2020   Cough 08/11/2020   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   Pure hypercholesterolemia 01/23/2020   Vitamin D  deficiency 10/23/2019   Right hip pain 05/15/2018   Estrogen deficiency 05/15/2018   Class 1 obesity due to excess calories with serious comorbidity and body mass index (BMI) of 31.0 to 31.9 in adult 05/15/2018   Diabetes mellitus with stage 2 chronic kidney disease (HCC) 01/11/2018   Benign hypertension with chronic kidney disease, stage II 01/11/2018    ONSET DATE: 07/04/2023 (referral date)  REFERRING DIAG:  R47.1 (ICD-10-CM) - Dysarthria and anarthria   R41.841 (ICD-10-CM) - Cognitive communication deficit  R13.10 (ICD-10-CM) - Dysphagia, unspecified type  G20.C (ICD-10-CM) - Primary parkinsonism (HCC)  R41.842 (ICD-10-CM) - Visuospatial deficit  R29.3 (ICD-10-CM) - Abnormal posture  R41.840 (ICD-10-CM) - Attention and concentration deficit  M62.81 (ICD-10-CM) - Muscle weakness (generalized)   07/04/23 Referral Notes: PSP   THERAPY DIAG:  Other lack of coordination  Unsteadiness on feet  Muscle weakness (generalized)  Visuospatial deficit  Attention and concentration deficit  Rationale for Evaluation and Treatment: Rehabilitation  SUBJECTIVE:   SUBJECTIVE STATEMENT: Pt reported going to church yesterday "It went good." Pt reported using electric toothbrush. Pt reported difficulty with turning on/off toothbrush using strength of R thumb.  Pt reported feeling like vision is getting worse. Pt reported "smaller area that I can see." Pt reported vision strategies are still effective. Pt's sister reported upcoming appointment with eye doctor next month.   Pt accompanied by: self and sister([name])  PERTINENT HISTORY: PSP, OSA on CPAP, HTN, DM, hx of seizure/seizure-like activity, hx of substance abuse  PRECAUTIONS: Fall, no driving, To reduce fall risk: SBA or CGA when walking, recommended to stabilize chair when pt completes sit-to-stand/stand-to-sit transitions d/t tendency for retropulsion. Recommended increased v/c secondary visual deficits.  WEIGHT BEARING RESTRICTIONS: No  PAIN:  Are you having pain? No  FALLS: Has patient fallen in last 6 months? Yes. Number of falls 5-6 falls, 1 fall occurred day before  yesterday - pt fell back when walking d/t focusing on something else    LIVING ENVIRONMENT: Lives with: lives with their family - brother Lives in: House/apartment Stairs: Yes: External: 3 stairs steps; bilateral but cannot reach both - previously had a ramp but not at this time Has following equipment at home: Single  point cane and Walker - 2 wheeled  PLOF: Patient was last living independently about a year ago (9 months) and also stopped driving about 9 months   PATIENT GOALS: "to be able to pick up stuff"  OBJECTIVE:  Note: Objective measures were completed at Evaluation unless otherwise noted.  HAND DOMINANCE: Right  ADLs: Overall ADLs:  Transfers/ambulation related to ADLs: Eating: difficulty with swallowing, supposed to eat small portions, speeds up when eating Grooming: difficulty with toothbrushing - standard toothbrush UB Dressing: ind with extra time LB Dressing: unable to look down therefore difficult, assistance from family Toileting: ind Bathing: ind for completing washing tasks, shower chair, grab bars - family direct supervision Tub Shower transfers: walk-in shower Equipment: Shower seat with back, Grab bars, and Walk in shower  IADLs: dependent  MOBILITY STATUS: Hx of falls and 2-wheeled walker, "I do freeze"   POSTURE COMMENTS:  Sitting ind  ACTIVITY TOLERANCE: Activity tolerance: Pt reported feeling more tired throughout the day.  FUNCTIONAL OUTCOME MEASURES: PSFS: 1.3    Total score = sum of the activity scores/number of activities Minimum detectable change (90%CI) for average score = 2 points Minimum detectable change (90%CI) for single activity score = 3 points   08/15/23 - 5.3    UPPER EXTREMITY ROM:    Active ROM Right eval Left eval  Shoulder flexion Approx. 90* Approx. 90*  Shoulder abduction    Shoulder adduction    Shoulder extension    Shoulder internal rotation Pt able to reach behind head though pt reported tightness in shoulders, RUE tightness >LUE tightness Pt able to reach behind head though pt reported tightness in shoulders, RUE tightness >LUE tightness  Shoulder external rotation    Elbow flexion [facility] WFL  Elbow extension WFL, v/c for full ext [facility]  Wrist flexion [facility] WFL  Wrist extension [facility] WFL  Wrist ulnar deviation    Wrist radial  deviation    Wrist pronation [facility] WFL  Wrist supination WFL WFL  (Blank rows = not tested)  BUE digit flex/ext - WFL, additional v/c for full ext of thumbs  UPPER EXTREMITY MMT:     MMT Right eval Left eval  Shoulder flexion    Shoulder abduction    Shoulder adduction    Shoulder extension    Shoulder internal rotation    Shoulder external rotation    Middle trapezius    Lower trapezius    Elbow flexion    Elbow extension    Wrist flexion    Wrist extension    Wrist ulnar deviation    Wrist radial deviation    Wrist pronation    Wrist supination    (Blank rows = not tested)  HAND FUNCTION: Grip strength: Right: 28.6, 26.6, 20.7 (25.3 lbs average) lbs; Left: 28.6, 27.5, 25.7 (27.3 lbs average)  lbs  COORDINATION: Box and Blocks:  Right 18blocks, Left 26blocks  SENSATION: Pt denied numbness/tingling  EDEMA: None noted  MUSCLE TONE: RUE: Within functional limits and LUE: Within functional limits  COGNITION: Overall cognitive status: Impaired Decreased insight into deficits and decreased safety awareness based on observation and per pt's sister report.   VISION: Subjective report: Pt's sister reported  pt frequently looks up, fixed gaze and no peripheral vision.  Baseline vision: Wears glasses all the time for distance vision.  Visual history: None noted, had corrective cataracts surgery both eyes  VISION ASSESSMENT: OT noted pt demo'd reduced eye blinking and often demo'd fixed upward gaze. Pt sometimes changed direction of fixed gaze towards a task or towards a person if another person was talking. However, pt generally unable to focus directly on task or directly on person talking.   PERCEPTION: Not tested  PRAXIS: Not tested  OBSERVATIONS: Pt was pleasant. Pt accompanied by family member. Pt demo'd fixed upward gaze though able to sometimes deviate gaze during conversation to attempt eye contact or to partially look at task. Pt demo'd decreased safety awareness  and decreased insight into deficits. Pt benefited from SBA to CGA and gait belt during ambulation with 2WW to reduce fall risk. Pt benefited from v/c to keep walker close to pt and to keep walker on ground. Pt benefited from continuous v/c to navigate environment and to complete sit-to-stand/stand-to-sit transfers. Pt benefited from OT stabilizing chair when completing sit-to-stand/stand-to-sit transfers d/t pt demo'ing retropulsion when sitting down.                                                                                                                            TREATMENT DATE:   Self-Care OT recommended f/u with eye doctor for vision check. Pt and family verbalized understanding and reported upcoming appointment with eye doctor next month.  OT educated pt and family on safety options and seating alternative options when at community events e.g. church. Pt verbalized understanding though reported preference for current seating arrangement at church.  Safety with ambulation using 2WW and with sit/stand transitions - to improve safety, to decrease fall risk, to improve understanding of vision compensation strategies. During ambulation, pt benefited from v/c to avoid x2 objects in environment and to keep eyes open while using chin tuck strategy. Pt returned demo of vision compensation strategies.  Review of vision compensation strategies. Pt reported vision strategies are still effective.  TherAct OT assessed pt's progress towards goals, see below for updates.   Scanning for markers on tabletop, using RUE in-hand manipulation to remove marker lids then using RUE thumb strength to replace lid - for visual scanning, to improve RUE strengthening, FM coordination, in-hand manipulation as needed for ADLs e.g. toothbrushing tasks.   Placing large pegs on pegboard using R thumb and using LUE for tactile input to locate pegboard targets, scanning for specific colors of pegs in bin and then placing  pegs in pattern - for visual scanning, for processing and pattern recognition, for sequencing, to improve RUE strengthening, FM coordination, in-hand manipulation as needed for ADLs e.g. toothbrushing tasks.    At end of pegboard task, pt reported "I can't see at all." OT assessed vital signs: vital signs within therapeutic parameters (BP: 137/70, 86 HR). Pt's sister clarified that pt may be avoiding participation in task  and falsely reporting vision changes. When OT asked pt additional questions about vision changes, pt then reported "I just got tired and I didn't want to do it anymore." OT educated pt and family on vision changes and concerns about medical implications, and therefore requested for pt to simply ask to change task in the future. Pt verbalized understanding.    PATIENT EDUCATION: Education details: see today's tx above Person educated: Patient and family member Education method: Explanation Education comprehension: verbalized understanding  HOME EXERCISE PROGRAM: 08/01/23: eating strategies, visual scanning activities on table 08/08/23 - Theraputty HEP (Access Code: [email or web address]) 08/12/23 - FM coordination HEP  GOALS: Goals reviewed with patient? Yes  SHORT TERM GOALS: Target date: 07/25/23  Pt and caregiver will return demo of BUE HEP using visual handouts and no more than min v/c. Baseline: Pt and family reported pt does not have HEP from previous OT POC a few years ago. Goal status: in progress  2.  Pt and caregiver will verbalize understanding of A/E and adaptive strategies for navigating phone safely.  Baseline: Pt and family reported difficulty with navigating phone. Pt's family expressed concerns that pt has too many apps on phone. 08/08/23 - Pt reported continuing to look into phone options.  08/22/23 - OT reviewed adaptive phone strategies and options. Pt's family reported pt is using current phone well.  Goal status: MET  3.  Pt and caregiver will verbalize understanding  of vision compensation strategies and environmental adaptations to promote safety in home environment for navigation during ambulation.  Baseline: Visual deficits, decreased insights into deficits 08/08/23 - Pt recalled vision strategy of "chin tuck" with mod v/c. OT educated pt on additional vision strategies. Pt and family acknowledged understanding. 08/22/23 - Pt ind recalled stopping to turn head, focusing on environment, chin tuck.  Goal status: MET and ongoing   LONG TERM GOALS: Target date: 09/09/23 (per 04/10/08 re-cert)  Pt will maintain BUE grip strength as needed to maintain grasp of toothbrush and eating utensils. Baseline: Grip strength: Right: 28.6, 26.6, 20.7 (25.3 lbs average) lbs; Left: 28.6, 27.5, 25.7 (27.3 lbs average)  lbs 08/08/23 - RUE: 25.3 lbs. LUE: 23.8 lbs.  (Maintained RUE, slightly decreased on LUE). Goal status: in progress  2.  Pt will maintain FM coordination as evidenced by maintaining box&blocks score of BUE. Baseline: Box and Blocks:  Right 18blocks, Left 26blocks 08/08/23 - RUE: 14 blocks. LUE: 26 blocks (maintained LUE, slight decrease for RUE) Goal status: in progress   3.  Pt and caregiver will verbalize understanding of adaptive strategies and A/E options to promote safety and decrease fall risk during ADL tasks. Baseline: Pt demo'd decreased insight into deficits, decreased safety awareness. Pt and family reported difficulty with holding toothbrush, completing LB dressing, picking up small items, controlling speed of eating.  08/08/23 - Pt's family reported intent to purchase electric toothbrush later today. Pt and family reported improved ability to control speed of eating and using chin tuck strategy to decrease instances of coughing. 08/15/23 - Pt and family reported no questions/concerns related to safety and A/E options reviewed at previous OT sessions.  Goal status: MET  4.  Patient will report at least two-point increase in average PSFS score or at least  three-point increase in a single activity score indicating functionally significant improvement given minimum detectable change using A/E and adaptive strategies PRN. Baseline: PSFS: 1.3 total score (See above for individual activity scores)  08/15/23 - PSFS = 5.3 Goal status: MET  ASSESSMENT:  CLINICAL IMPRESSION:  Patient seen today for occupational therapy treatment (eval on 07/11/23) for PSP and associated symptoms. Hx includes PSP, OSA on CPAP, HTN, DM, hx of seizure/seizure-like activity, hx of substance abuse. Pt met 2 goals today, demo'ing fair to good carryover of vision compensation strategies. Pt tolerated tasks well overall though demo'd limited interest in tasks near end of session. Pt continuing to benefit from cueing for processing and problem-solving aspects of functional tasks.  The patient requires the skill of OT to help with ongoing exercise and mobility to prevent rapid and significant decline in functional mobility, strength, and ADL function. Pt and caregivers would also benefit from education regarding safety, A/E, and adaptive strategies in the home environment. Therefore, skilled maintenance OT is recommended for this patient.  PERFORMANCE DEFICITS: in functional skills including ADLs, IADLs, coordination, dexterity, proprioception, sensation, ROM, strength, flexibility, Fine motor control, Gross motor control, mobility, balance, body mechanics, endurance, decreased knowledge of precautions, decreased knowledge of use of DME, vision, and UE functional use, cognitive skills including attention, energy/drive, learn, memory, problem solving, safety awareness, and sequencing, and psychosocial skills including environmental adaptation.   IMPAIRMENTS: are limiting patient from ADLs, IADLs, rest and sleep, leisure, and social participation.   CO-MORBIDITIES: has co-morbidities such as PSP that affects occupational performance. Patient will benefit from skilled OT to address above  impairments and improve overall function.  MODIFICATION OR ASSISTANCE TO COMPLETE EVALUATION: Maximum or significant modification of tasks or assist is necessary to complete an evaluation.  OT OCCUPATIONAL PROFILE AND HISTORY: Detailed assessment: Review of records and additional review of physical, cognitive, psychosocial history related to current functional performance.  CLINICAL DECISION MAKING: High - multiple treatment options, significant modification of task necessary  REHAB POTENTIAL: Fair d/t progression of PSP  EVALUATION COMPLEXITY: High    PLAN:  OT FREQUENCY: 2x/week  OT DURATION: 4 weeks + ADDITIONAL 2X PER WEEK FOR 4 WEEKS (per 04/10/08 re-cert)  PLANNED INTERVENTIONS: 96045 OT Re-evaluation, 97535 self care/ADL training, 40981 therapeutic exercise, 97530 therapeutic activity, 97112 neuromuscular re-education, 97140 manual therapy, 97035 ultrasound, 97018 paraffin, 19147 fluidotherapy, 97010 moist heat, 97010 cryotherapy, 97760 Orthotics management and training, 82956 Splinting (initial encounter), [id number] Subsequent splinting/medication, passive range of motion, functional mobility training, visual/perceptual remediation/compensation, energy conservation, patient/family education, and DME and/or AE instructions  RECOMMENDED OTHER SERVICES: PT eval already completed. SLP referral placed.  CONSULTED AND AGREED WITH PLAN OF CARE: Patient  PLAN FOR NEXT SESSION:  Continue to check-in on goals Continue to focus on safety with stand-to-sit/sit-to-stand Review HEP PRN Visual scanning exercises - e.g. tabletop level or use of elevated surface Continue to review vision compensation strategies Consider D/C in next 1 to 3 sessions   Oakley Bellman, OT 08/22/2023, 1:16 PM

## 2023-08-22 NOTE — Therapy (Signed)
 OUTPATIENT SPEECH LANGUAGE PATHOLOGY TREATMENT   Patient Name: April Larson MRN: [id number] DOB:1950/08/09, 73 y.o., female Today's Date: 08/22/2023  PCP: Cleave Curling MD REFERRING PROVIDER: Wess Hammed, NP  END OF SESSION:  End of Session - 08/22/23 1150     Visit Number 5    Number of Visits 13    Date for SLP Re-Evaluation 09/26/23    Authorization Type [employer] - auth requested today via email    SLP Start Time 1230    SLP Stop Time  1312    SLP Time Calculation (min) 42 min    Activity Tolerance Patient tolerated treatment well               Past Medical History:  Diagnosis Date   Diabetes mellitus without complication (HCC)    Hypertension    Parkinson disease (HCC)    Substance abuse (HCC)    Past Surgical History:  Procedure Laterality Date   EYE SURGERY     Patient Active Problem List   Diagnosis Date Noted   OSA on CPAP 05/17/2023   Anemia 04/03/2023   Supranuclear palsy (HCC) 04/03/2023   Atypical syncope 03/04/2023   Seizure disorder (HCC) 03/04/2023   Seizure-like activity (HCC) 03/02/2023   Thrombocytosis 12/06/2022   Gait abnormality 12/06/2022   Encounter for annual physical exam 11/24/2022   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   Decreased dorsalis pedis pulse 11/17/2020   Tachycardia 08/11/2020   Cough 08/11/2020   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   Pure hypercholesterolemia 01/23/2020   Vitamin D  deficiency 10/23/2019   Right hip pain 05/15/2018   Estrogen deficiency 05/15/2018   Class 1 obesity due to excess calories with serious comorbidity and body mass index (BMI) of 31.0 to 31.9 in adult 05/15/2018   Diabetes mellitus with stage 2 chronic kidney disease (HCC) 01/11/2018   Benign hypertension with chronic kidney disease, stage II 01/11/2018    ONSET DATE: 07/04/2023 (referral date)   REFERRING DIAG: R47.1 (ICD-10-CM) - Dysarthria and anarthria R13.10 (ICD-10-CM) - Dysphagia, unspecified type  G20.C (ICD-10-CM) - Primary parkinsonism (HCC) R41.842 (ICD-10-CM) - Visuospatial deficit  THERAPY DIAG:  Cognitive communication deficit  Dysarthria and anarthria  Dysphagia, unspecified type  Rationale for Evaluation and Treatment: Rehabilitation  SUBJECTIVE:   SUBJECTIVE STATEMENT: "I just don't talk" Pt accompanied by: family member  PERTINENT HISTORY: PSP, OSA on CPAP, HTN, DM, hx of seizure/seizure-like activity, hx of substance abuse   PAIN: Are you having pain? No  FALLS: Has patient fallen in last 6 months?  Yes, See PT evaluation for details  LIVING ENVIRONMENT: Lives with: lives with their family - now lives with brother Lives in: House/apartment  PLOF:  Level of assistance: Needed assistance with ADLs, Needed assistance with IADLS Employment: Retired  PATIENT GOALS: improve overall functioning  OBJECTIVE:  Note: Objective measures were completed at Evaluation unless otherwise noted.    TREATMENT DATE:  08/22/23: Endorsed reduced communication at group meeting, with pt correlated to thought sequencing and recall. Generated strategy for pre-planning scripts and engaging with material prior to session. Recommended bringing book to next session; pt able to recall x2 during session. Targeted thought cohesion and verbal sequencing for story conclusions, in which pt able to complete with prompting to aid more to story. Message was coherent and sequential. Pt stated "I don't want to do this" which reportedly occurs at home. Questions PSP impact on motivation and mental health status given recent life changes (not working, moved in with brother). Recommended finding  intrinsic/extrinsic motivating factors to aid task completion. Continues to carryover swallow precautions with reported benefit. Endorsed decreased s/sx of aspiration at mealtimes.   08/19/23: See last note.   08/15/23: Reported "less coughing" with trained swallow precautions. Sister affirmed positive benefit and  carryover of swallow precautions. Endorsed decline in communication secondary to word retrieval deficits. Pt provided example of anomia for "putty" at home. Administered [employer] with anomia evidenced x10 and dysnomia x2 (self-corrected both). Initiated education and instruction of word retrieval strategies and SFA for description strategy. Usual questioning cues required to generate appropriate description. Pt able to carryover "normal" volume with occasional min A.   08/08/23: Reviewed recommended swallow strategies provided at eval. Pt able to recall 1/4 strategies independently. Provided handout with recommendations. Endorsed improved swallow function with minimizing distractions and taking smaller bites. Targeted functional problem solving, with usual fading to occasional mod A to ID deficit/breakdown and potential solutions. Endorsed reduced verbal output related to increased volume. Conversational volume averaged low 80s dB. Targeted volume modification, with pt able to maintain WNL conversational volume with occasional min A.   08/01/23: ST evaluation and POC completed. Based on family's concern for dysphagia, particularly coughing with solids, completed clincial bedside swallow evaluation. Evidenced decreased attention to task and reduced oral awareness resulting in mild-mod oral residue. Provided recommendations for increased swallow safety, including small bites, avoid overfilling mouth, eye gaze forward for neutral head positioning, and minimizing distractions (ex: sitting at table versus in front of TV). Pt verbalized understanding and pt able to teach back recommendations x1 independently and others listed with occasional mod A.   PATIENT EDUCATION: Education details: see above Person educated: Patient and Sibling Education method: Explanation Education comprehension: verbalized understanding and needs further education   GOALS: Goals reviewed with patient? Yes  SHORT TERM GOALS:  Target date: 08/29/2023  Pt will teach back and demonstrate recommended swallow strategies given occasional min A Baseline: Goal status: MET  2.  Pt will demonstrate adequate functional problem solving for 3/4 targeted scenarios given occasional min A  Baseline:  Goal status: ONGOING  3. Pt will employ attention strategies to optimize focus during targeted tasks/conversations given occasional min A Baseline:  Goal status: ONGOING  4.   Pt will utilize speech strategies to optimize communication effectiveness in 3/4 opportunities given occasional min A Baseline:  Goal status: ONGOING   LONG TERM GOALS: Target date: 09/26/2023  Pt will successfully carryover recommended swallow strategies at home > 1 week  Baseline:  Goal status: ONGOING  2.  Family will subjectively report improved attention and problem solving at home with use of trained techniques  Baseline:  Goal status: ONGOING  3.  Pt will subjectively report success using speech strategies to optimize communication effectiveness in 2 opportunities at home  Baseline:  Goal status: ONGOING  4.  Pt will complete objective swallow study to evaluate pharyngeal function and aspiration risk as needed Baseline:  Goal status: ONGOING   ASSESSMENT:  CLINICAL IMPRESSION: Patient is a 73 y.o. F who was seen today for ST evaluation for PSP. Exhibits mild decline in cognition, including reduced problem solving and decreased selective/alternating/divided attention. Pt endorsed speech changes at evaluation but appears baseline in conversation per previous intervention (affirmed by sister). Word retrieval deficits indicated today, which SLP addressed. Presents with varied volume and intermittent pausing for thought formation. Swallow function reportedly improved with trained strategies. Suspect dysphagia reports largely related to cognitive component but may refer for MBSS if warranted. Pt would benefit from  skilled ST intervention to  optimize overall functioning and safety.   OBJECTIVE IMPAIRMENTS: include attention, memory, awareness, executive functioning, dysarthria, and dysphagia. These impairments are limiting patient from household responsibilities, ADLs/IADLs, effectively communicating at home and in community, and safety when swallowing. Factors affecting potential to achieve goals and functional outcome are ability to learn/carryover information, co-morbidities, medical prognosis, and previous level of function. Patient will benefit from skilled SLP services to address above impairments and improve overall function.  REHAB POTENTIAL: Good  PLAN:  SLP FREQUENCY: 2x/week  SLP DURATION: 8 weeks (extended for scheduling)  PLANNED INTERVENTIONS: Aspiration precaution training, Pharyngeal strengthening exercises, Diet toleration management , Environmental controls, Cueing hierachy, Cognitive reorganization, Internal/external aids, Functional tasks, Multimodal communication approach, SLP instruction and feedback, Compensatory strategies, Patient/family education, [phone] Treatment of speech (30 or 45 min) , 92526 Treatment of swallowing function, and 10272- Speech [address] [place], [place], Eval Compre, [facility]    [employer], CCC-SLP 08/22/2023, 3:05 PM

## 2023-08-23 ENCOUNTER — Other Ambulatory Visit (HOSPITAL_COMMUNITY): Payer: Self-pay

## 2023-08-25 ENCOUNTER — Other Ambulatory Visit: Payer: Self-pay

## 2023-08-25 MED FILL — Omeprazole Cap Delayed Release 40 MG: ORAL | 30 days supply | Qty: 30 | Fill #1 | Status: CN

## 2023-08-25 MED FILL — Atorvastatin Calcium Tab 10 MG (Base Equivalent): ORAL | 30 days supply | Qty: 30 | Fill #5 | Status: CN

## 2023-08-26 ENCOUNTER — Other Ambulatory Visit (HOSPITAL_COMMUNITY): Payer: Self-pay

## 2023-08-26 ENCOUNTER — Ambulatory Visit: Admitting: Occupational Therapy

## 2023-08-26 ENCOUNTER — Ambulatory Visit: Admitting: Speech Pathology

## 2023-08-26 DIAGNOSIS — R41841 Cognitive communication deficit: Secondary | ICD-10-CM

## 2023-08-26 DIAGNOSIS — R1310 Dysphagia, unspecified: Secondary | ICD-10-CM | POA: Diagnosis not present

## 2023-08-26 DIAGNOSIS — R471 Dysarthria and anarthria: Secondary | ICD-10-CM | POA: Diagnosis not present

## 2023-08-26 DIAGNOSIS — M6281 Muscle weakness (generalized): Secondary | ICD-10-CM | POA: Diagnosis not present

## 2023-08-26 DIAGNOSIS — R278 Other lack of coordination: Secondary | ICD-10-CM

## 2023-08-26 DIAGNOSIS — R2681 Unsteadiness on feet: Secondary | ICD-10-CM | POA: Diagnosis not present

## 2023-08-26 DIAGNOSIS — R41840 Attention and concentration deficit: Secondary | ICD-10-CM

## 2023-08-26 DIAGNOSIS — R41842 Visuospatial deficit: Secondary | ICD-10-CM

## 2023-08-26 NOTE — Therapy (Signed)
 OUTPATIENT SPEECH LANGUAGE PATHOLOGY TREATMENT   Patient Name: April Larson MRN: [id number] DOB:[birth year], 73 y.o., female Today's Date: 08/26/2023  PCP: Cleave Curling MD REFERRING PROVIDER: Wess Hammed, NP  END OF SESSION:  End of Session - 08/26/23 1110     Visit Number 6    Number of Visits 13    Date for SLP Re-Evaluation 09/26/23    Authorization Type [employer] Medicare - auth requested today via email    SLP Start Time 1103    SLP Stop Time  1145    SLP Time Calculation (min) 42 min    Activity Tolerance Patient tolerated treatment well                Past Medical History:  Diagnosis Date   Diabetes mellitus without complication (HCC)    Hypertension    Parkinson disease (HCC)    Substance abuse (HCC)    Past Surgical History:  Procedure Laterality Date   EYE SURGERY     Patient Active Problem List   Diagnosis Date Noted   OSA on CPAP 05/17/2023   Anemia 04/03/2023   Supranuclear palsy (HCC) 04/03/2023   Atypical syncope 03/04/2023   Seizure disorder (HCC) 03/04/2023   Seizure-like activity (HCC) 03/02/2023   Thrombocytosis 12/06/2022   Gait abnormality 12/06/2022   Encounter for annual physical exam 11/24/2022   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   Decreased dorsalis pedis pulse 11/17/2020   Tachycardia 08/11/2020   Cough 08/11/2020   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   Pure hypercholesterolemia 01/23/2020   Vitamin D  deficiency 10/23/2019   Right hip pain 05/15/2018   Estrogen deficiency 05/15/2018   Class 1 obesity due to excess calories with serious comorbidity and body mass index (BMI) of 31.0 to 31.9 in adult 05/15/2018   Diabetes mellitus with stage 2 chronic kidney disease (HCC) 01/11/2018   Benign hypertension with chronic kidney disease, stage II 01/11/2018    ONSET DATE: 07/04/2023 (referral date)   REFERRING DIAG: R47.1 (ICD-10-CM) - Dysarthria and anarthria R13.10 (ICD-10-CM) - Dysphagia, unspecified type  G20.C (ICD-10-CM) - Primary parkinsonism (HCC) R41.842 (ICD-10-CM) - Visuospatial deficit  THERAPY DIAG:  Cognitive communication deficit  Dysarthria and anarthria  Dysphagia, unspecified type  Rationale for Evaluation and Treatment: Rehabilitation  SUBJECTIVE:   SUBJECTIVE STATEMENT: "I just don't talk" Pt accompanied by: family member  PERTINENT HISTORY: PSP, OSA on CPAP, HTN, DM, hx of seizure/seizure-like activity, hx of substance abuse   PAIN: Are you having pain? No  FALLS: Has patient fallen in last 6 months?  Yes, See PT evaluation for details  LIVING ENVIRONMENT: Lives with: lives with their family - now lives with brother Lives in: House/apartment  PLOF:  Level of assistance: Needed assistance with ADLs, Needed assistance with IADLS Employment: Retired  PATIENT GOALS: improve overall functioning  OBJECTIVE:  Note: Objective measures were completed at Evaluation unless otherwise noted.    TREATMENT DATE:  08/26/23: SLP further discussed challenges in participation in group discussions. Identified impaired reading ability as barrier to preparation for meetings. Sister was unaware of this. Discussed strategies to compensation: reading with sibling, connecting with other group member to read and discuss prior to meeting, audio book. SLP provided sample spoken stimuli, asking for pt to generate thoughts and ideas regarding to simulate group discussion. Initial attempt notable for deceased content, unrelated to prompt. Benefit from questioning cues. Second attempt SLP cued taking time to think prior, with pt demonstrating improve though cohesion and content.   08/22/23: Endorsed reduced communication  at group meeting, with pt correlated to thought sequencing and recall. Generated strategy for pre-planning scripts and engaging with material prior to session. Recommended bringing book to next session; pt able to recall x2 during session. Targeted thought cohesion and verbal  sequencing for story conclusions, in which pt able to complete with prompting to aid more to story. Message was coherent and sequential. Pt stated "I don't want to do this" which reportedly occurs at home. Questions PSP impact on motivation and mental health status given recent life changes (not working, moved in with brother). Recommended finding intrinsic/extrinsic motivating factors to aid task completion. Continues to carryover swallow precautions with reported benefit. Endorsed decreased s/sx of aspiration at mealtimes.   08/19/23: Reviewed SFA as framework for description anomia strategy. Provided model using simple target. SLP then had pt practice utilizing to aid in skill building. Pt provided 4+ salient features of x5 targets with provision of occasional questioning cues, particularly to provide 4th or 5th feature. Discussed pt's daily/weekly routine to determine functional targets for problem solving and implementation of support strategies and techniques for successful participation. Pt is unable to ID any concerns in response to SLP questioning. Using generic scenarios, pt able to complete x2 functional problem solving tasks with challenge to come up with 2 solutions. During conversation regarding personally relevant and highly salient topic (recovery), pt maintaining attention to conversation, rare (x2) anomia with repair. Quiet environment and interactive nature of conversation likely facilitative. Pt maintaining WFL volume, mild prolonged speech sounds. Intelligibility intact.    Reviewed swallow strategies with pt demonstrating understanding of reduced distractions, small bites, look forward (neutral position), focus on eating with mod-I. She and sister endorse ongoing improvements in swallowing with use of strategies. Improvements c/b reduced coughing. SLP provided education for intentional bolus collection prior to swallow to address oral residue. Education provided regarding aspiration  precautions with pt endorsing daily oral care, self-feeding, out of bed for all PO.   08/15/23: Reported "less coughing" with trained swallow precautions. Sister affirmed positive benefit and carryover of swallow precautions. Endorsed decline in communication secondary to word retrieval deficits. Pt provided example of anomia for "putty" at home. Administered [employer] with anomia evidenced x10 and dysnomia x2 (self-corrected both). Initiated education and instruction of word retrieval strategies and SFA for description strategy. Usual questioning cues required to generate appropriate description. Pt able to carryover "normal" volume with occasional min A.   08/08/23: Reviewed recommended swallow strategies provided at eval. Pt able to recall 1/4 strategies independently. Provided handout with recommendations. Endorsed improved swallow function with minimizing distractions and taking smaller bites. Targeted functional problem solving, with usual fading to occasional mod A to ID deficit/breakdown and potential solutions. Endorsed reduced verbal output related to increased volume. Conversational volume averaged low 80s dB. Targeted volume modification, with pt able to maintain WNL conversational volume with occasional min A.   08/01/23: ST evaluation and POC completed. Based on family's concern for dysphagia, particularly coughing with solids, completed clincial bedside swallow evaluation. Evidenced decreased attention to task and reduced oral awareness resulting in mild-mod oral residue. Provided recommendations for increased swallow safety, including small bites, avoid overfilling mouth, eye gaze forward for neutral head positioning, and minimizing distractions (ex: sitting at table versus in front of TV). Pt verbalized understanding and pt able to teach back recommendations x1 independently and others listed with occasional mod A.   PATIENT EDUCATION: Education details: see above Person educated:  Patient and Sibling Education method: Explanation Education comprehension: verbalized understanding  and needs further education   GOALS: Goals reviewed with patient? Yes  SHORT TERM GOALS: Target date: 08/29/2023  Pt will teach back and demonstrate recommended swallow strategies given occasional min A Baseline: Goal status: MET  2.  Pt will demonstrate adequate functional problem solving for 3/4 targeted scenarios given occasional min A  Baseline:  Goal status: PARTIALLY MET  3. Pt will employ attention strategies to optimize focus during targeted tasks/conversations given occasional min A Baseline:  Goal status: MET  4.   Pt will utilize speech strategies to optimize communication effectiveness in 3/4 opportunities given occasional min A Baseline:  Goal status: MET   LONG TERM GOALS: Target date: 09/26/2023  Pt will successfully carryover recommended swallow strategies at home > 1 week  Baseline: 08/22/23; 08/26/23 Goal status: MET  2.  Family will subjectively report improved attention and problem solving at home with use of trained techniques  Baseline:  Goal status: ONGOING  3.  Pt will subjectively report success using speech strategies to optimize communication effectiveness in 2 opportunities at home  Baseline: 08/22/23; 08/26/23 Goal status: MET  4.  Pt will complete objective swallow study to evaluate pharyngeal function and aspiration risk as needed Baseline:  Goal status: deferred, improved s/sx dysphagia with use of swallow strategies    ASSESSMENT:  SPEECH THERAPY DISCHARGE SUMMARY  Visits from Start of Care: 6  Current functional level related to goals / functional outcomes: Pt d/c from therapy with ongoing deficits in setting of PSP but is pleased with tool and strategies targeted over therapy course. No overt concerns noted at d/c.    Remaining deficits: Cognitive communication deficit, dysphagia, dysarthria    Education / Equipment: structured  language tasks, dysarthria strategies and compensations, safe swallow strategies, aspiration precautions, cognitive compensations and strategies, functional implementation of cognitive strategies, HEP    Patient agrees to discharge. Patient goals were partially met. Patient is being discharged due to being pleased with the current functional level.  CLINICAL IMPRESSION: Patient is a 73 y.o. F who was seen today for ST evaluation for PSP. Exhibits mild decline in cognition, including reduced problem solving and decreased selective/alternating/divided attention. Pt endorsed speech changes at evaluation but appears baseline in conversation per previous intervention (affirmed by sister). Word retrieval deficits indicated today, which SLP addressed. Presents with varied volume and intermittent pausing for thought formation. Swallow function reportedly improved with trained strategies. Pt is agreeable to ST d/c, is pleased with current strategies/compensations.   OBJECTIVE IMPAIRMENTS: include attention, memory, awareness, executive functioning, dysarthria, and dysphagia. These impairments are limiting patient from household responsibilities, ADLs/IADLs, effectively communicating at home and in community, and safety when swallowing. Factors affecting potential to achieve goals and functional outcome are ability to learn/carryover information, co-morbidities, medical prognosis, and previous level of function. Patient will benefit from skilled SLP services to address above impairments and improve overall function.  REHAB POTENTIAL: Good  PLAN:  SLP FREQUENCY: 2x/week  SLP DURATION: 8 weeks (extended for scheduling)  PLANNED INTERVENTIONS: Aspiration precaution training, Pharyngeal strengthening exercises, Diet toleration management , Environmental controls, Cueing hierachy, Cognitive reorganization, Internal/external aids, Functional tasks, Multimodal communication approach, SLP instruction and feedback,  Compensatory strategies, Patient/family education, [phone] Treatment of speech (30 or 45 min) , 92526 Treatment of swallowing function, and 91478- Speech [address] [place], [place], Eval Compre, [employer]    Alston Jerry, CCC-SLP 08/26/2023, 11:50 AM

## 2023-08-26 NOTE — Therapy (Signed)
 OUTPATIENT OCCUPATIONAL THERAPY NEURO TREATMENT   Patient Name: April Larson [place] MRN: [id number] DOB:[birth year], 73 y.o., female Today's Date: 08/26/2023  PCP: Cleave Curling, MD  REFERRING PROVIDER: Wess Hammed, NP   OCCUPATIONAL THERAPY DISCHARGE SUMMARY  Visits from Start of Care: 7  Current functional level related to goals / functional outcomes: Pt has met 100% of STG and LTG to satisfactory levels.  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 OT recommended to continue to complete HEP to maintain function. Pt and family acknowledged understanding.   Education / Equipment: Pt and family have needed materials and education. Pt and family understand how to continue on with self-management. See tx notes for more details.    Patient goals were met. Patient is being discharged due to meeting the stated rehab goals and maximized rehab potential at this time. Pt and family agreeable to D/C. Pt and family confirmed no additional questions/concerns related to OT at this time.   Note: Pt has OT PD screen scheduled 01/2024. OT recommends pt attend OT screen at that time for f/u.      END OF SESSION:  OT End of Session - 08/26/23 1649     Visit Number 7    Number of Visits 10    Date for OT Re-Evaluation 09/09/23    Authorization Type [employer] Dual, no auth required    OT Start Time 1153    OT Stop Time 1232    OT Time Calculation (min) 39 min    Activity Tolerance Patient tolerated treatment well    Behavior During Therapy WFL for tasks assessed/performed                  Past Medical History:  Diagnosis Date   Diabetes mellitus without complication (HCC)    Hypertension    Parkinson disease (HCC)    Substance abuse (HCC)    Past Surgical History:  Procedure Laterality Date   EYE SURGERY     Patient Active Problem List   Diagnosis Date Noted   OSA on CPAP 05/17/2023   Anemia 04/03/2023   Supranuclear palsy (HCC) 04/03/2023    Atypical syncope 03/04/2023   Seizure disorder (HCC) 03/04/2023   Seizure-like activity (HCC) 03/02/2023   Thrombocytosis 12/06/2022   Gait abnormality 12/06/2022   Encounter for annual physical exam 11/24/2022   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   Decreased dorsalis pedis pulse 11/17/2020   Tachycardia 08/11/2020   Cough 08/11/2020   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   Pure hypercholesterolemia 01/23/2020   Vitamin D  deficiency 10/23/2019   Right hip pain 05/15/2018   Estrogen deficiency 05/15/2018   Class 1 obesity due to excess calories with serious comorbidity and body mass index (BMI) of 31.0 to 31.9 in adult 05/15/2018   Diabetes mellitus with stage 2 chronic kidney disease (HCC) 01/11/2018   Benign hypertension with chronic kidney disease, stage II 01/11/2018    ONSET DATE: 07/04/2023 (referral date)  REFERRING DIAG:  R47.1 (ICD-10-CM) - Dysarthria and anarthria  R41.841 (ICD-10-CM) - Cognitive communication deficit  R13.10 (ICD-10-CM) - Dysphagia, unspecified type  G20.C (ICD-10-CM) - Primary parkinsonism (HCC)  R41.842 (ICD-10-CM) - Visuospatial deficit  R29.3 (ICD-10-CM) - Abnormal posture  R41.840 (ICD-10-CM) - Attention and concentration deficit  M62.81 (ICD-10-CM) - Muscle weakness (generalized)   07/04/23 Referral Notes: PSP   THERAPY DIAG:  Other lack of coordination  Unsteadiness on feet  Muscle weakness (generalized)  Visuospatial deficit  Attention and concentration deficit  Rationale for Evaluation and Treatment: Rehabilitation  SUBJECTIVE:   SUBJECTIVE STATEMENT: No acute changes or updates. Pt reported D/C from SLP today.  Pt accompanied by: self and sister([name])  PERTINENT HISTORY: PSP, OSA on CPAP, HTN, DM, hx of seizure/seizure-like activity, hx of substance abuse  PRECAUTIONS: Fall, no driving, To reduce fall risk: SBA or CGA when walking, recommended to stabilize chair when pt completes sit-to-stand/stand-to-sit transitions d/t  tendency for retropulsion. Recommended increased v/c secondary visual deficits.  WEIGHT BEARING RESTRICTIONS: No  PAIN:  Are you having pain? No  FALLS: Has patient fallen in last 6 months? Yes. Number of falls 5-6 falls, 1 fall occurred day before yesterday - pt fell back when walking d/t focusing on something else    LIVING ENVIRONMENT: Lives with: lives with their family - brother Lives in: House/apartment Stairs: Yes: External: 3 stairs steps; bilateral but cannot reach both - previously had a ramp but not at this time Has following equipment at home: Single point cane and Walker - 2 wheeled  PLOF: Patient was last living independently about a year ago (9 months) and also stopped driving about 9 months   PATIENT GOALS: "to be able to pick up stuff"  OBJECTIVE:  Note: Objective measures were completed at Evaluation unless otherwise noted.  HAND DOMINANCE: Right  ADLs: Overall ADLs:  Transfers/ambulation related to ADLs: Eating: difficulty with swallowing, supposed to eat small portions, speeds up when eating Grooming: difficulty with toothbrushing - standard toothbrush UB Dressing: ind with extra time LB Dressing: unable to look down therefore difficult, assistance from family Toileting: ind Bathing: ind for completing washing tasks, shower chair, grab bars - family direct supervision Tub Shower transfers: walk-in shower Equipment: Shower seat with back, Grab bars, and Walk in shower  IADLs: dependent  MOBILITY STATUS: Hx of falls and 2-wheeled walker, "I do freeze"   POSTURE COMMENTS:  Sitting ind  ACTIVITY TOLERANCE: Activity tolerance: Pt reported feeling more tired throughout the day.  FUNCTIONAL OUTCOME MEASURES: PSFS: 1.3    Total score = sum of the activity scores/number of activities Minimum detectable change (90%CI) for average score = 2 points Minimum detectable change (90%CI) for single activity score = 3 points   08/15/23 - 5.3    UPPER  EXTREMITY ROM:    Active ROM Right eval Left eval  Shoulder flexion Approx. 90* Approx. 90*  Shoulder abduction    Shoulder adduction    Shoulder extension    Shoulder internal rotation Pt able to reach behind head though pt reported tightness in shoulders, RUE tightness >LUE tightness Pt able to reach behind head though pt reported tightness in shoulders, RUE tightness >LUE tightness  Shoulder external rotation    Elbow flexion [facility] WFL  Elbow extension WFL, v/c for full ext [facility]  Wrist flexion [facility] WFL  Wrist extension [facility] WFL  Wrist ulnar deviation    Wrist radial deviation    Wrist pronation [facility] WFL  Wrist supination WFL WFL  (Blank rows = not tested)  BUE digit flex/ext - WFL, additional v/c for full ext of thumbs  UPPER EXTREMITY MMT:     MMT Right eval Left eval  Shoulder flexion    Shoulder abduction    Shoulder adduction    Shoulder extension    Shoulder internal rotation    Shoulder external rotation    Middle trapezius    Lower trapezius    Elbow flexion    Elbow extension    Wrist flexion    Wrist extension    Wrist ulnar deviation  Wrist radial deviation    Wrist pronation    Wrist supination    (Blank rows = not tested)  HAND FUNCTION: Grip strength: Right: 28.6, 26.6, 20.7 (25.3 lbs average) lbs; Left: 28.6, 27.5, 25.7 (27.3 lbs average)  lbs  COORDINATION: Box and Blocks:  Right 18blocks, Left 26blocks  SENSATION: Pt denied numbness/tingling  EDEMA: None noted  MUSCLE TONE: RUE: Within functional limits and LUE: Within functional limits  COGNITION: Overall cognitive status: Impaired Decreased insight into deficits and decreased safety awareness based on observation and per pt's sister report.   VISION: Subjective report: Pt's sister reported pt frequently looks up, fixed gaze and no peripheral vision.  Baseline vision: Wears glasses all the time for distance vision.  Visual history: None noted, had corrective cataracts surgery both  eyes  VISION ASSESSMENT: OT noted pt demo'd reduced eye blinking and often demo'd fixed upward gaze. Pt sometimes changed direction of fixed gaze towards a task or towards a person if another person was talking. However, pt generally unable to focus directly on task or directly on person talking.   PERCEPTION: Not tested  PRAXIS: Not tested  OBSERVATIONS: Pt was pleasant. Pt accompanied by family member. Pt demo'd fixed upward gaze though able to sometimes deviate gaze during conversation to attempt eye contact or to partially look at task. Pt demo'd decreased safety awareness and decreased insight into deficits. Pt benefited from SBA to CGA and gait belt during ambulation with 2WW to reduce fall risk. Pt benefited from v/c to keep walker close to pt and to keep walker on ground. Pt benefited from continuous v/c to navigate environment and to complete sit-to-stand/stand-to-sit transfers. Pt benefited from OT stabilizing chair when completing sit-to-stand/stand-to-sit transfers d/t pt demo'ing retropulsion when sitting down.                                                                                                                            TREATMENT DATE:   TherAct Review of FM coordination and theraputty HEP - to improve carryover, to maintain strength and FM coordination of BUE. Pt reported not completing FM coordination HEP at home though completing theraputty HEP. OT provided additional copy of theraputty and FM coordination handout (per 08/12/23 pt instructions) with large font per pt and family request.  FM coordination HEP:  Tennis ball rotation - pt returned demo. Stacking checkers - pt benefited from elevated surface as vision compensation strategy.  Placing cards on table and turning cards over - pt returned demo. Pen tricks - pt benefited from fading therapist modeling.  Theraputty HEP - pt returned demo.   OT assessed pt's progress towards goals, see below for updates.   OT  noted pt demo'd delayed blinking of L eye sometimes today and pt's sister reported this is typical.   Self-Care OT educated on option for OT D/C today and good progress towards goals. Pt and family agreeable.   Review of visual compensation strategies and safety during functional  tasks, ADLs/IADLs, and ambulation. Pt and family verbalized understanding.  Recommended to continue with HEP after D/C to ensure maintenance of strength and coordination. Pt and family acknowledged understanding.   Pt benefited from SBA and intermittent v/c for safety during ambulation and sit/stand transitions. Pt safely exited clinic.    PATIENT EDUCATION: Education details: see today's tx above Person educated: Patient and family member Education method: Explanation Education comprehension: verbalized understanding  HOME EXERCISE PROGRAM: 08/01/23: eating strategies, visual scanning activities on table 08/08/23 - Theraputty HEP (Access Code: [email or web address]) 08/12/23 - FM coordination HEP  GOALS: Goals reviewed with patient? Yes  SHORT TERM GOALS: Target date: 07/25/23  Pt and caregiver will return demo of BUE HEP using visual handouts and no more than min v/c. Baseline: Pt and family reported pt does not have HEP from previous OT POC a few years ago. 08/26/23 - Per pt report, inconsistent carryover of HEP. Pt returned demo of FM coordination HEP today with therapist modeling and min v/c. Pt reported completing theraputty HEP every other day. Pt returned demo of threaputty with min v/c. Goal status: MET  2.  Pt and caregiver will verbalize understanding of A/E and adaptive strategies for navigating phone safely.  Baseline: Pt and family reported difficulty with navigating phone. Pt's family expressed concerns that pt has too many apps on phone. 08/08/23 - Pt reported continuing to look into phone options.  08/22/23 - OT reviewed adaptive phone strategies and options. Pt's family reported pt is using current phone well.   Goal status: MET  3.  Pt and caregiver will verbalize understanding of vision compensation strategies and environmental adaptations to promote safety in home environment for navigation during ambulation.  Baseline: Visual deficits, decreased insights into deficits 08/08/23 - Pt recalled vision strategy of "chin tuck" with mod v/c. OT educated pt on additional vision strategies. Pt and family acknowledged understanding. 08/22/23 - Pt ind recalled stopping to turn head, focusing on environment, chin tuck.  Goal status: MET and ongoing   LONG TERM GOALS: Target date: 09/09/23 (per 6/0/45 re-cert)  Pt will maintain BUE grip strength as needed to maintain grasp of toothbrush and eating utensils. Baseline: Grip strength: Right: 28.6, 26.6, 20.7 (25.3 lbs average) lbs; Left: 28.6, 27.5, 25.7 (27.3 lbs average)  lbs 08/08/23 - RUE: 25.3 lbs. LUE: 23.8 lbs.  (Maintained RUE, slightly decreased on LUE). 08/26/23 - RUE: 25.7 lbs. LUE: 24.4 lbs (maintained BUE) Goal status: MET  2.  Pt will maintain FM coordination as evidenced by maintaining box&blocks score of BUE. Baseline: Box and Blocks:  Right 18blocks, Left 26blocks 08/08/23 - RUE: 14 blocks. LUE: 26 blocks (maintained LUE, slight decrease for RUE) 08/26/23 - RUE: 22 blocks. LUE: 28 blocks.  (Increase in score for BUE) Goal status: MET  3.  Pt and caregiver will verbalize understanding of adaptive strategies and A/E options to promote safety and decrease fall risk during ADL tasks. Baseline: Pt demo'd decreased insight into deficits, decreased safety awareness. Pt and family reported difficulty with holding toothbrush, completing LB dressing, picking up small items, controlling speed of eating.  08/08/23 - Pt's family reported intent to purchase electric toothbrush later today. Pt and family reported improved ability to control speed of eating and using chin tuck strategy to decrease instances of coughing. 08/15/23 - Pt and family reported no  questions/concerns related to safety and A/E options reviewed at previous OT sessions.  Goal status: MET  4.  Patient will report at least two-point increase in average PSFS score  or at least three-point increase in a single activity score indicating functionally significant improvement given minimum detectable change using A/E and adaptive strategies PRN. Baseline: PSFS: 1.3 total score (See above for individual activity scores)  08/15/23 - PSFS = 5.3 Goal status: MET  ASSESSMENT:  CLINICAL IMPRESSION:  Patient seen today for occupational therapy treatment (eval on 07/11/23) for PSP and associated symptoms. Hx includes PSP, OSA on CPAP, HTN, DM, hx of seizure/seizure-like activity, hx of substance abuse.   Pt has met 100% of STG and LTG to satisfactory levels. 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 OT recommended to continue to complete HEP to maintain function. Pt and family acknowledged understanding. Pt and family have needed materials and education. Pt and family understand how to continue on with self-management. See tx notes for more details. Patient goals were met. Patient is being discharged due to meeting the stated rehab goals and maximized rehab potential at this time. Pt and family agreeable to D/C. Pt and family confirmed no additional questions/concerns related to OT at this time.   Note: Pt has OT PD screen scheduled 01/2024. OT recommends pt attend OT screen at that time for f/u.    PERFORMANCE DEFICITS: in functional skills including ADLs, IADLs, coordination, dexterity, proprioception, sensation, ROM, strength, flexibility, Fine motor control, Gross motor control, mobility, balance, body mechanics, endurance, decreased knowledge of precautions, decreased knowledge of use of DME, vision, and UE functional use, cognitive skills including attention, energy/drive, learn, memory, problem solving, safety awareness, and sequencing, and psychosocial skills including environmental  adaptation.   IMPAIRMENTS: are limiting patient from ADLs, IADLs, rest and sleep, leisure, and social participation.   CO-MORBIDITIES: has co-morbidities such as PSP that affects occupational performance. Patient will benefit from skilled OT to address above impairments and improve overall function.  MODIFICATION OR ASSISTANCE TO COMPLETE EVALUATION: Maximum or significant modification of tasks or assist is necessary to complete an evaluation.  OT OCCUPATIONAL PROFILE AND HISTORY: Detailed assessment: Review of records and additional review of physical, cognitive, psychosocial history related to current functional performance.  CLINICAL DECISION MAKING: High - multiple treatment options, significant modification of task necessary  REHAB POTENTIAL: Fair d/t progression of PSP  EVALUATION COMPLEXITY: High    PLAN:  OT FREQUENCY: 2x/week  OT DURATION: 4 weeks + ADDITIONAL 2X PER WEEK FOR 4 WEEKS (per 0/9/81 re-cert)  PLANNED INTERVENTIONS: 19147 OT Re-evaluation, 97535 self care/ADL training, 82956 therapeutic exercise, 97530 therapeutic activity, 97112 neuromuscular re-education, 97140 manual therapy, 97035 ultrasound, 97018 paraffin, 21308 fluidotherapy, 97010 moist heat, 97010 cryotherapy, 97760 Orthotics management and training, 65784 Splinting (initial encounter), [id number] Subsequent splinting/medication, passive range of motion, functional mobility training, visual/perceptual remediation/compensation, energy conservation, patient/family education, and DME and/or AE instructions  RECOMMENDED OTHER SERVICES: PT eval already completed. SLP referral placed.  CONSULTED AND AGREED WITH PLAN OF CARE: Patient  PLAN FOR NEXT SESSION:  OT D/C completed 08/26/23  Note: Pt has OT PD screen scheduled 01/2024. OT recommends pt attend OT screen at that time for f/u.     Oakley Bellman, OT 08/26/2023, 4:57 PM

## 2023-08-30 ENCOUNTER — Other Ambulatory Visit: Payer: Self-pay

## 2023-08-30 ENCOUNTER — Ambulatory Visit: Admitting: Occupational Therapy

## 2023-08-30 MED FILL — Omeprazole Cap Delayed Release 40 MG: ORAL | 30 days supply | Qty: 30 | Fill #1 | Status: CN

## 2023-08-30 MED FILL — Atorvastatin Calcium Tab 10 MG (Base Equivalent): ORAL | 30 days supply | Qty: 30 | Fill #5 | Status: CN

## 2023-08-31 ENCOUNTER — Other Ambulatory Visit: Payer: Self-pay

## 2023-09-02 ENCOUNTER — Ambulatory Visit: Admitting: Occupational Therapy

## 2023-09-05 ENCOUNTER — Other Ambulatory Visit: Payer: Self-pay | Admitting: Internal Medicine

## 2023-09-15 ENCOUNTER — Other Ambulatory Visit: Payer: Self-pay

## 2023-09-15 MED FILL — Omeprazole Cap Delayed Release 40 MG: ORAL | 30 days supply | Qty: 30 | Fill #1 | Status: AC

## 2023-09-15 MED FILL — Atorvastatin Calcium Tab 10 MG (Base Equivalent): ORAL | 30 days supply | Qty: 30 | Fill #5 | Status: AC

## 2023-09-16 ENCOUNTER — Other Ambulatory Visit: Payer: Self-pay

## 2023-09-19 ENCOUNTER — Encounter: Payer: Self-pay | Admitting: Internal Medicine

## 2023-09-19 ENCOUNTER — Ambulatory Visit (INDEPENDENT_AMBULATORY_CARE_PROVIDER_SITE_OTHER): Payer: Self-pay | Admitting: Internal Medicine

## 2023-09-19 ENCOUNTER — Telehealth: Payer: Self-pay

## 2023-09-19 VITALS — BP 130/70 | HR 89 | Temp 97.8°F | Ht 62.0 in | Wt 151.8 lb

## 2023-09-19 DIAGNOSIS — Z9989 Dependence on other enabling machines and devices: Secondary | ICD-10-CM | POA: Diagnosis not present

## 2023-09-19 DIAGNOSIS — I129 Hypertensive chronic kidney disease with stage 1 through stage 4 chronic kidney disease, or unspecified chronic kidney disease: Secondary | ICD-10-CM

## 2023-09-19 DIAGNOSIS — G231 Progressive supranuclear ophthalmoplegia [Steele-Richardson-Olszewski]: Secondary | ICD-10-CM

## 2023-09-19 DIAGNOSIS — E1122 Type 2 diabetes mellitus with diabetic chronic kidney disease: Secondary | ICD-10-CM

## 2023-09-19 DIAGNOSIS — E7800 Pure hypercholesterolemia, unspecified: Secondary | ICD-10-CM

## 2023-09-19 DIAGNOSIS — G4733 Obstructive sleep apnea (adult) (pediatric): Secondary | ICD-10-CM | POA: Diagnosis not present

## 2023-09-19 DIAGNOSIS — N182 Chronic kidney disease, stage 2 (mild): Secondary | ICD-10-CM | POA: Diagnosis not present

## 2023-09-19 DIAGNOSIS — G20C Parkinsonism, unspecified: Secondary | ICD-10-CM | POA: Diagnosis not present

## 2023-09-19 NOTE — Progress Notes (Signed)
 Complex Care Management Note  Care Guide Note 09/19/2023 Name: April Larson MRN: [id number] DOB: [birth year]  April Larson is a 73 y.o. year old female who sees Cleave Curling, MD for primary care. I reached out to April Larson by phone today to offer complex care management services.  [name] was given information about Complex Care Management services today including:   The Complex Care Management services include support from the care team which includes your Nurse Care Manager, Clinical Social Worker, or Pharmacist.  The Complex Care Management team is here to help remove barriers to the health concerns and goals most important to you. Complex Care Management services are voluntary, and the patient may decline or stop services at any time by request to their care team member.   Complex Care Management Consent Status: Patient agreed to services and verbal consent obtained.   Follow up plan:  Telephone appointment with complex care management team member scheduled for:  10-27-23  Encounter Outcome:  Patient Scheduled  [facility], [facility] Guide  Direct Dial: [phone]  Fax [phone]

## 2023-09-19 NOTE — Progress Notes (Addendum)
 I,[name], CMA,acting as a Neurosurgeon for [name], MD.,have documented all relevant documentation on the behalf of [name], MD,as directed by  [name], MD while in the presence of [name], MD.  Subjective:  Patient ID: April Larson , female    DOB: Dec 30, 1950 , 73 y.o.   MRN: [id number]  Chief Complaint  Patient presents with   Hypertension    Patient presents today for bp, dm & cholesterol follow up. She reports compliance with medications. Denies headache, chest pain & sob.  Her sister asks if their are any day programs for people with parkinson disease.    Diabetes   Hyperlipidemia    HPI Discussed the use of AI scribe software for clinical note transcription with the patient, who gave verbal consent to proceed.  History of Present Illness April Larson is a 73 year old female with diabetes and hypertension who presents for a routine check-up. She is accompanied by her sister.  She is focused on managing her diabetes and hypertension. Her blood pressure readings at home are around 130-134/70 mmHg, and she is taking telmisartan  and hydrochlorothiazide  daily. She exercises daily, primarily walking, and aims to maintain her blood pressure below 130/80 mmHg.  For diabetes management, she stopped Ozempic  because she no longer wanted to take the medication.  She states her blood sugar levels are between 120-130 mg/dL and a target of less than 120 mg/dL in the morning. Her last A1c was 7.0%. She is mindful of her diet, particularly fruit intake, to better manage her blood sugar levels.  She has a history of Parkinson's disease and takes Sinemet  (carbidopa /levodopa ) as prescribed by her neurologist. She also takes Exelon  for memory issues, which has shown improvement. Her memory is sharp, and she is cognitively doing well.  She experiences swallowing difficulties, often coughing and needing to hold her head down while  eating. A speech therapist advised taking small bites and tilting her chin down when swallowing.  She is not using her CPAP machine due to discomfort with the mask, despite having a new mask ordered in February. She sleeps from 1 AM to 9:30 AM, getting approximately eight hours of sleep.   Diabetes She presents for her follow-up diabetic visit. She has type 2 diabetes mellitus. Pertinent negatives for hypoglycemia include no headaches. Associated symptoms include blurred vision. Pertinent negatives for diabetes include no polydipsia, no polyphagia and no polyuria. Risk factors for coronary artery disease include diabetes mellitus, dyslipidemia, hypertension, post-menopausal and sedentary lifestyle. She participates in exercise intermittently. Her breakfast blood glucose is taken between 8-9 am. Her breakfast blood glucose range is generally 70-90 mg/dl. An ACE inhibitor/angiotensin II receptor blocker is being taken. Eye exam is current.  Hypertension This is a chronic problem. The current episode started more than 1 year ago. Condition status: FAIR CONTROL. Associated symptoms include blurred vision. Pertinent negatives include no anxiety or headaches. The current treatment provides moderate improvement.     Past Medical History:  Diagnosis Date   Diabetes mellitus without complication (HCC)    Hypertension    Parkinson disease (HCC)    Substance abuse (HCC)      Family History  Problem Relation Age of Onset   Cancer Mother    Diabetes Father    Cancer Brother    Breast cancer Maternal Aunt    Parkinsonism Neg Hx      Current Outpatient Medications:    ACCU-CHEK FASTCLIX LANCETS MISC, USE TO  CHECK BLOOD SUGARS TWICE DAILY, Disp: 100 each, Rfl: 11   Alcohol Swabs (B-D SINGLE USE SWABS REGULAR) PADS, USE AS NEEDED., Disp: 100 each, Rfl: 0   aspirin  EC 81 MG tablet, Take 1 tablet (81 mg total) by mouth daily., Disp: 90 tablet, Rfl: 1   atorvastatin  (LIPITOR) 10 MG tablet, Take 1 tablet  (10 mg total) by mouth daily., Disp: 90 tablet, Rfl: 3   Biotin  10 MG CAPS, Take 1 tablet by mouth daily. (Patient taking differently: Take 10 mg by mouth See admin instructions. Take 10 mg by mouth at 10:30 AM), Disp: 90 capsule, Rfl: 1   Blood Glucose Monitoring Suppl (TRUE METRIX AIR GLUCOSE METER) DEVI, USE AS DIRECTED TO CHECK BLOOD SUGARS., Disp: 1 each, Rfl: 0   Blood Glucose Monitoring Suppl (TRUE METRIX METER) w/Device KIT, Use as directed to check blood sugar once daily, Disp: 1 kit, Rfl: 1   calcium  carbonate (OSCAL) 1500 (600 Ca) MG TABS tablet, Take 1 tablet (1,500 mg total) by mouth daily with breakfast. (Patient taking differently: Take 1,500 mg by mouth See admin instructions. Take 1,500 mg by mouth at 10:30 AM), Disp: 90 tablet, Rfl: 1   carbidopa -levodopa  (SINEMET  IR) 25-100 MG tablet, Take 1 tablet by mouth 4 (four) times daily (1 tablet before breakfast, 1 tablet at noon, 1 tablet in the evening, and 1 tablet at bedtime)., Disp: 90 tablet, Rfl: 2   Cholecalciferol  (VITAMIN D3) 125 MCG (5000 UT) CAPS, Take 5,000 Units by mouth See admin instructions. Take 5,000 units by mouth at 10:30 AM, Disp: , Rfl:    diclofenac  Sodium (VOLTAREN ) 1 % GEL, APPLY 2 GRAMS TOPICALLY TO THE AFFECTED AREA FOUR TIMES DAILY (Patient taking differently: Apply 2 g topically See admin instructions. Apply 2 grams to the neck, under the breasts, and to the back 2 times a day), Disp: 100 g, Rfl: 2   fluticasone  (FLONASE ) 50 MCG/ACT nasal spray, Place 2 sprays into both nostrils daily. (Patient taking differently: Place 2 sprays into both nostrils in the morning.), Disp: 16 g, Rfl: 2   glucose blood (TRUE METRIX BLOOD GLUCOSE TEST) test strip, Use as instructed, Disp: 100 each, Rfl: 12   Insulin  Pen Needle (B-D ULTRAFINE III SHORT PEN) 31G X 8 MM MISC, Use as directed, Disp: 100 each, Rfl: 11   loratadine  (CLARITIN ) 10 MG tablet, Take 1 tablet by mouth daily (Patient taking differently: Take 10 mg by mouth in the  morning. Take 10 mg by mouth at 10:30 AM), Disp: 90 tablet, Rfl: 1   MAGNESIUM  PO, Take 1 tablet by mouth at bedtime., Disp: , Rfl:    Multiple Vitamins-Minerals (ONE-A-DAY WOMENS 50+ ADVANTAGE) TABS, Take 1 tablet by mouth daily (Patient taking differently: Take 1 tablet by mouth See admin instructions. Take 1 tablet by mouth at 10:30 AM), Disp: 90 tablet, Rfl: 1   Omega-3 Fatty Acids (FISH OIL ) 1000 MG CAPS, Take 1 capsule by mouth daily (Patient taking differently: Take 1,000 mg by mouth See admin instructions. Take 1,000 mg by mouth at 3 PM), Disp: 90 capsule, Rfl: 1   omeprazole  (PRILOSEC) 40 MG capsule, Take 1 capsule (40 mg total) by mouth daily before breakfast., Disp: 90 capsule, Rfl: 2   OZEMPIC , 0.25 OR 0.5 MG/DOSE, 2 MG/3ML SOPN, INJECT 0.25MG  UNDER THE SKIN ONCE WEEKLY, Disp: 3 mL, Rfl: 0   rivastigmine  (EXELON ) 1.5 MG capsule, Take 1 capsule (1.5 mg total) by mouth 2 (two) times daily., Disp: 180 capsule, Rfl: 3  telmisartan -hydrochlorothiazide  (MICARDIS  HCT) 40-12.5 MG tablet, Take 1 tablet by mouth every morning., Disp: 90 tablet, Rfl: 1   TRUEplus Lancets 28G MISC, USE AS DIRECTED TO CHECK BLOOD SUGARS., Disp: 100 each, Rfl: 1   Turmeric 500 MG CAPS, Take 1 capsule by mouth 2 (two) times daily. (Patient taking differently: Take 500 mg by mouth See admin instructions. Take 500 mg by mouth at 7 AM and 4:30 PM), Disp: 180 capsule, Rfl: 1   TYLENOL  8 HOUR ARTHRITIS PAIN 650 MG CR tablet, Take 1,300 mg by mouth at bedtime., Disp: , Rfl:    vitamin C  (ASCORBIC ACID ) 500 MG tablet, Take 1 tablet (500 mg total) by mouth daily. (Patient taking differently: Take 500 mg by mouth See admin instructions. Take 500 mg by mouth at 10:30 AM), Disp: 90 tablet, Rfl: 1   clopidogrel  (PLAVIX ) 75 MG tablet, Take 75 mg by mouth See admin instructions. Take 75 mg by mouth at 3:30 PM (Patient not taking: Reported on 09/19/2023), Disp: , Rfl:    No Known Allergies   Review of Systems  Constitutional:  Negative.   Eyes:  Positive for blurred vision.  Respiratory: Negative.    Cardiovascular: Negative.   Gastrointestinal: Negative.   Endocrine: Negative for polydipsia, polyphagia and polyuria.  Neurological: Negative.  Negative for headaches.  Psychiatric/Behavioral: Negative.       Today's Vitals   09/19/23 1154  BP: 130/70  Pulse: 89  Temp: 97.8 F (36.6 C)  SpO2: 98%  Weight: 151 lb 12.8 oz (68.9 kg)  Height: 5' 2 (1.575 m)   Body mass index is 27.76 kg/m.  Wt Readings from Last 3 Encounters:  09/19/23 151 lb 12.8 oz (68.9 kg)  08/10/23 152 lb 3.2 oz (69 kg)  06/17/23 149 lb (67.6 kg)     Objective:  Physical Exam Vitals and nursing note reviewed.  Constitutional:      Appearance: Normal appearance.  HENT:     Head: Normocephalic and atraumatic.   Eyes:     Comments: Limited upward gaze   Cardiovascular:     Rate and Rhythm: Normal rate and regular rhythm.     Heart sounds: Normal heart sounds.  Pulmonary:     Effort: Respiratory distress present.     Breath sounds: Normal breath sounds.   Musculoskeletal:     Cervical back: Normal range of motion.     Comments: Ambulatory with walker   Skin:    General: Skin is warm.     Findings: No erythema.   Neurological:     Mental Status: She is alert.     Comments: Slow speech Abnormal gait  Psychiatric:        Mood and Affect: Mood normal.        Behavior: Behavior normal.       Assessment And Plan:  Diabetes mellitus with stage 2 chronic kidney disease (HCC) [E11.22, N18.2] Assessment & Plan: Blood sugar levels slightly above target. I plan to resume low-dose Ozempic , not taking metformin . - Monitor blood sugar levels. - Encourage dietary modifications, including limiting fruit intake to low glycemic options after dinner. - Obtain A1c level today.  Orders: -     CMP14+EGFR -     Hemoglobin A1c -     AMB Referral VBCI Care Management  Benign hypertension with chronic kidney disease, stage II  [I12.9, N18.2] Assessment & Plan: Chronic, stable. She is encouraged to check her blood pressures at home. All questions were answered to her satisfaction. CKD stage  2 is chronic, she is encouraged to keep BP well controlled and to stay well hydrated to decrease risk of CKD progression.   - Continue telmisartan  and hydrochlorothiazide  daily. - Encourage regular exercise.  Orders: -     AMB Referral VBCI Care Management  Pure hypercholesterolemia [E78.00] Assessment & Plan: Chronic, currently on atorvastatin  10mg  daily.  LDL goal is less than 70. Importance of medication/dietary compliance was discussed with the patient.    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 She will continue with Sinemet  and rivastigmine .  - Continue Exelon  and Sinemet  as prescribed by neurology. - Encourage adherence to speech therapy recommendations for swallowing.  Orders: -     AMB Referral VBCI Care Management  OSA on CPAP Assessment & Plan: Non-compliance with CPAP due to mask discomfort. Risk of decreased oxygenation during sleep. - Discuss CPAP compliance with neurologist at next visit. - Encourage use of CPAP with the new mask.   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    Uses roller walker  General Health Maintenance Due for health screenings. Family history of colon cancer noted. - Ensure eye exam is scheduled for September. - Schedule mammogram for October. - Plan for colonoscopy next year.  Return if symptoms worsen or fail to improve.  Patient was given opportunity to ask questions. Patient verbalized understanding of the plan and was able to repeat key elements of the plan. All questions were answered to their satisfaction.   I, [name], MD, have reviewed all documentation for this visit. The documentation on 09/19/23 for the exam, diagnosis, procedures, and orders are all  accurate and complete.   IF YOU HAVE BEEN REFERRED TO A SPECIALIST, IT MAY TAKE 1-2 WEEKS TO SCHEDULE/PROCESS THE REFERRAL. IF YOU HAVE NOT HEARD FROM US /SPECIALIST IN TWO WEEKS, PLEASE GIVE US  A CALL AT [phone].   THE PATIENT IS ENCOURAGED TO PRACTICE SOCIAL DISTANCING DUE TO THE COVID-19 PANDEMIC.

## 2023-09-19 NOTE — Patient Instructions (Signed)
 Hypertension, Adult Hypertension is another name for high blood pressure. High blood pressure forces your heart to work harder to pump blood. This can cause problems over time. There are two numbers in a blood pressure reading. There is a top number (systolic) over a bottom number (diastolic). It is best to have a blood pressure that is below 120/80. What are the causes? The cause of this condition is not known. Some other conditions can lead to high blood pressure. What increases the risk? Some lifestyle factors can make you more likely to develop high blood pressure: Smoking. Not getting enough exercise or physical activity. Being overweight. Having too much fat, sugar, calories, or salt (sodium) in your diet. Drinking too much alcohol. Other risk factors include: Having any of these conditions: Heart disease. Diabetes. High cholesterol. Kidney disease. Obstructive sleep apnea. Having a family history of high blood pressure and high cholesterol. Age. The risk increases with age. Stress. What are the signs or symptoms? High blood pressure may not cause symptoms. Very high blood pressure (hypertensive crisis) may cause: Headache. Fast or uneven heartbeats (palpitations). Shortness of breath. Nosebleed. Vomiting or feeling like you may vomit (nauseous). Changes in how you see. Very bad chest pain. Feeling dizzy. Seizures. How is this treated? This condition is treated by making healthy lifestyle changes, such as: Eating healthy foods. Exercising more. Drinking less alcohol. Your doctor may prescribe medicine if lifestyle changes do not help enough and if: Your top number is above 130. Your bottom number is above 80. Your personal target blood pressure may vary. Follow these instructions at home: Eating and drinking  If told, follow the DASH eating plan. To follow this plan: Fill one half of your plate at each meal with fruits and vegetables. Fill one fourth of your plate  at each meal with whole grains. Whole grains include whole-wheat pasta, brown rice, and whole-grain bread. Eat or drink low-fat dairy products, such as skim milk or low-fat yogurt. Fill one fourth of your plate at each meal with low-fat (lean) proteins. Low-fat proteins include fish, chicken without skin, eggs, beans, and tofu. Avoid fatty meat, cured and processed meat, or chicken with skin. Avoid pre-made or processed food. Limit the amount of salt in your diet to less than 1,500 mg each day. Do not drink alcohol if: Your doctor tells you not to drink. You are pregnant, may be pregnant, or are planning to become pregnant. If you drink alcohol: Limit how much you have to: 0-1 drink a day for women. 0-2 drinks a day for men. Know how much alcohol is in your drink. In the [place] one drink equals one 12 oz bottle of beer (355 mL), one 5 oz glass of wine (148 mL), or one 1 oz glass of hard liquor (44 mL). Lifestyle  Work with your doctor to stay at a healthy weight or to lose weight. Ask your doctor what the best weight is for you. Get at least 30 minutes of exercise that causes your heart to beat faster (aerobic exercise) most days of the week. This may include walking, swimming, or biking. Get at least 30 minutes of exercise that strengthens your muscles (resistance exercise) at least 3 days a week. This may include lifting weights or doing Pilates. Do not smoke or use any products that contain nicotine or tobacco. If you need help quitting, ask your doctor. Check your blood pressure at home as told by your doctor. Keep all follow-up visits. Medicines Take over-the-counter and prescription medicines  only as told by your doctor. Follow directions carefully. Do not skip doses of blood pressure medicine. The medicine does not work as well if you skip doses. Skipping doses also puts you at risk for problems. Ask your doctor about side effects or reactions to medicines that you should watch  for. Contact a doctor if: You think you are having a reaction to the medicine you are taking. You have headaches that keep coming back. You feel dizzy. You have swelling in your ankles. You have trouble with your vision. Get help right away if: You get a very bad headache. You start to feel mixed up (confused). You feel weak or numb. You feel faint. You have very bad pain in your: Chest. Belly (abdomen). You vomit more than once. You have trouble breathing. These symptoms may be an emergency. Get help right away. Call 911. Do not wait to see if the symptoms will go away. Do not drive yourself to the hospital. Summary Hypertension is another name for high blood pressure. High blood pressure forces your heart to work harder to pump blood. For most people, a normal blood pressure is less than 120/80. Making healthy choices can help lower blood pressure. If your blood pressure does not get lower with healthy choices, you may need to take medicine. This information is not intended to replace advice given to you by your health care provider. Make sure you discuss any questions you have with your health care provider. Document Revised: 01/08/2021 Document Reviewed: 01/08/2021 Elsevier Patient Education  2024 [employer].

## 2023-09-20 ENCOUNTER — Ambulatory Visit: Payer: Self-pay | Admitting: Internal Medicine

## 2023-09-20 LAB — CMP14+EGFR
ALT: 4 IU/L (ref 0–32)
AST: 9 IU/L (ref 0–40)
Albumin: 4.4 g/dL (ref 3.8–4.8)
Alkaline Phosphatase: 123 IU/L — ABNORMAL HIGH (ref 44–121)
BUN/Creatinine Ratio: 20 (ref 12–28)
BUN: 18 mg/dL (ref 8–27)
Bilirubin Total: 0.3 mg/dL (ref 0.0–1.2)
CO2: 21 mmol/L (ref 20–29)
Calcium: 9.8 mg/dL (ref 8.7–10.3)
Chloride: 106 mmol/L (ref 96–106)
Creatinine, Ser: 0.89 mg/dL (ref 0.57–1.00)
Globulin, Total: 2.5 g/dL (ref 1.5–4.5)
Glucose: 173 mg/dL — ABNORMAL HIGH (ref 70–99)
Potassium: 4.3 mmol/L (ref 3.5–5.2)
Sodium: 141 mmol/L (ref 134–144)
Total Protein: 6.9 g/dL (ref 6.0–8.5)
eGFR: 69 mL/min/{1.73_m2} (ref >59)

## 2023-09-20 LAB — HEMOGLOBIN A1C
Est. average glucose Bld gHb Est-mCnc: 194 mg/dL
Hgb A1c MFr Bld: 8.4 % — ABNORMAL HIGH (ref 4.8–5.6)

## 2023-09-21 DIAGNOSIS — N390 Urinary tract infection, site not specified: Secondary | ICD-10-CM | POA: Diagnosis not present

## 2023-09-23 ENCOUNTER — Other Ambulatory Visit: Payer: Self-pay | Admitting: Internal Medicine

## 2023-09-25 NOTE — Assessment & Plan Note (Signed)
 Non-compliance with CPAP due to mask discomfort. Risk of decreased oxygenation during sleep. - Discuss CPAP compliance with neurologist at next visit. - Encourage use of CPAP with the new mask.

## 2023-09-25 NOTE — Assessment & Plan Note (Signed)
 Blood sugar levels slightly above target. I plan to resume low-dose Ozempic , not taking metformin . - Monitor blood sugar levels. - Encourage dietary modifications, including limiting fruit intake to low glycemic options after dinner. - Obtain A1c level today.

## 2023-09-25 NOTE — Assessment & Plan Note (Signed)
 Chronic, followed by Ophthalmology.

## 2023-09-25 NOTE — Assessment & Plan Note (Signed)
 Chronic, Neurology input is appreciated. She will continue with Sinemet  and rivastigmine .  - Continue Exelon  and Sinemet  as prescribed by neurology. - Encourage adherence to speech therapy recommendations for swallowing.

## 2023-09-25 NOTE — Assessment & Plan Note (Signed)
 Chronic, currently on atorvastatin 10mg  daily.  LDL goal is less than 70. Importance of medication/dietary compliance was discussed with the patient.

## 2023-09-25 NOTE — Assessment & Plan Note (Signed)
 Chronic, stable. She is encouraged to check her blood pressures at home. All questions were answered to her satisfaction. 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   - Continue telmisartan  and hydrochlorothiazide  daily. - Encourage regular exercise.

## 2023-09-26 ENCOUNTER — Other Ambulatory Visit: Payer: Self-pay

## 2023-09-27 ENCOUNTER — Encounter: Payer: Self-pay | Admitting: Internal Medicine

## 2023-09-27 ENCOUNTER — Other Ambulatory Visit: Payer: Self-pay | Admitting: Licensed Clinical Social Worker

## 2023-09-28 NOTE — Patient Outreach (Signed)
 Complex Care Management   Visit Note  09/28/2023  Name:  April Larson MRN: [id number] DOB: September 18, 1950  Situation: Referral received for Complex Care Management related to SDOH Barriers:  Social Isolation I obtained verbal consent from Patient.  Visit completed with patient and patient daughter April Larson  on the phone  Background:   Past Medical History:  Diagnosis Date   Diabetes mellitus without complication (HCC)    Hypertension    Parkinson disease (HCC)    Substance abuse (HCC)     Assessment: Patient is in need of support services for A-Typical Support Group  SDOH Interventions    Flowsheet Row Patient Outreach Telephone from 09/27/2023 in [place] [facility] Clinical Support from 08/10/2023 in [facility] Telephone from 03/08/2023 in [place] [facility]PART[facility] ED to Hosp-Admission (Discharged) from 03/02/2023 in [place] [facility] PCU Clinical Support from 08/04/2022 in [facility] Chronic Care Management from 10/28/2021 in [facility]  SDOH Interventions        Food Insecurity Interventions Intervention Not Indicated Intervention Not Indicated Intervention Not Indicated Intervention Not Indicated Intervention Not Indicated [id number] Referral, Other (Comment)  [Placed referral to mobile meals,  sent info about [name]]  Housing Interventions Intervention Not Indicated Intervention Not Indicated Intervention Not Indicated Intervention Not Indicated Intervention Not Indicated --  Transportation Interventions Intervention Not Indicated Intervention Not Indicated Intervention Not Indicated Intervention Not Indicated Intervention Not Indicated Intervention Not Indicated  Utilities Interventions Intervention Not Indicated Intervention Not Indicated Intervention Not Indicated Intervention Not Indicated Intervention Not Indicated  --  Alcohol Usage Interventions -- Intervention Not Indicated (Score <7) -- -- -- --  Depression Interventions/Treatment  -- PHQ2-9 Score <4 Follow-up Not Indicated -- -- -- --  Financial Strain Interventions Intervention Not Indicated Intervention Not Indicated -- -- Intervention Not Indicated --  Physical Activity Interventions -- Intervention Not Indicated -- -- Patient Refused, Other (Comments) --  Stress Interventions -- Intervention Not Indicated -- -- Intervention Not Indicated --  Social Connections Interventions -- Intervention Not Indicated -- -- -- --  Health Literacy Interventions -- Intervention Not Indicated -- -- -- --      Recommendation:   none  Follow Up Plan:   Telephone follow up appointment date/time:  10/11/2023 at 1:00 pm  Tobias CHARM [name], [name] [facility], [facility] Social Worker Direct Dial: [phone]  Fax: [phone]

## 2023-09-28 NOTE — Patient Instructions (Signed)
 Visit Information  Thank you for taking time to visit with me today. Please don't hesitate to contact me if I can be of assistance to you before our next scheduled appointment.  Our next appointment is by telephone on 10/11/2023 at 1:00 pm Please call the care guide team at [phone] if you need to cancel or reschedule your appointment.   Following is a copy of your care plan:   Goals Addressed             This Visit's Progress    BSW VBCI Social Work Care Plan       Problems:   Social Connections will follow up with A-Typical Parkinson Support Group at [facility] as directed by Social Work  CSW Clinical Goal(s):   Over the next 2 weeks the Patient will will follow up with A-Typical Parkinson Support Group at [facility] as directed by Social Work.  Interventions:  SW will email the information how to connect and register with the support Group and will get there patient on the email for the senior services list serve.   Patient Goals/Self-Care Activities:  Coordinate with [facility]  to assist with support programs.  Plan:   Telephone follow up appointment with care management team member scheduled for:  10/11/2023 at 1:00 pm        Please call the Suicide and Crisis Lifeline: 988 go to [facility] [address] [place] [phone]) call 911 if you are experiencing a Mental Health or Behavioral Health Crisis or need someone to talk to.  The patient verbalized understanding of instructions, educational materials, and care plan provided today and DECLINED offer to receive copy of patient instructions, educational materials, and care plan.   April CHARM [name], [name] [facility], [facility] Social Worker Direct Dial: [phone]  Fax: [phone]

## 2023-10-03 ENCOUNTER — Encounter: Attending: Psychology | Admitting: Psychology

## 2023-10-03 DIAGNOSIS — F4323 Adjustment disorder with mixed anxiety and depressed mood: Secondary | ICD-10-CM | POA: Insufficient documentation

## 2023-10-03 DIAGNOSIS — R413 Other amnesia: Secondary | ICD-10-CM | POA: Insufficient documentation

## 2023-10-03 DIAGNOSIS — R269 Unspecified abnormalities of gait and mobility: Secondary | ICD-10-CM | POA: Insufficient documentation

## 2023-10-03 DIAGNOSIS — G4733 Obstructive sleep apnea (adult) (pediatric): Secondary | ICD-10-CM | POA: Diagnosis not present

## 2023-10-03 DIAGNOSIS — G20C Parkinsonism, unspecified: Secondary | ICD-10-CM | POA: Insufficient documentation

## 2023-10-04 ENCOUNTER — Encounter: Payer: Self-pay | Admitting: Psychology

## 2023-10-04 NOTE — Progress Notes (Signed)
 Neuropsychological Consultation   Patient:   April Larson   DOB:   [birth year]  MR Number:  [id number]  Location:  [facility] [address] [place], [address] [place] [address] Dept: [phone]           Date of Service:   10/03/2023  Location of Service and Individuals present: Today's visit was conducted in outpatient clinic office with the patient, her sister and myself present.  Start Time:   1 PM End Time:   3 PM  Today's visit was 2 hours in total.  1 hour and 15 minutes was spent in face-to-face clinical interview and the other 45 minutes was spent with record review, report writing and case formulation. Patient Consent and Confidentiality: Limits of confidentiality were reviewed including fact that medical notes would be included in her electronic medical records for other appropriate medical providers to review.  Consent for Evaluation and Treatment:  Signed:  Yes Explanation of Privacy Policies:  Signed:  Yes Discussion of Confidentiality Limits:  Yes  Provider/Observer:  Norleen Asa, Psy.D.       Clinical Neuropsychologist       Billing Code/Service: [phone]  Chief Complaint:     Chief Complaint  Patient presents with   Memory Loss   Decreased Visual Acuity   Fall   Gait Problem   Depression    Reason for Service:    April Larson is a 73 year old female referred for neuropsychological consultation and consideration of psychotherapeutic interventions for adjustment disorder with depression and anxiety type symptoms, memory problems that have worsened over the past 2 years.  While objective neuropsychological assessment of memory functions will likely be conducted, the initial focus will be on adjustment and coping issues with her progressively worsening condition and neurological status.  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 After initial event, returned to normal functioning but then began motor changes and was referred to neurology. Memory has been generally stable over past year with repeat MOCA testing 25/30. In June 2024, noted worsening memory and began resuming use of CPAP machine with reports of some improvement overall with compliance with CPAP.  CURRENT FUNCTIONING - Family: Currently living with brother and sister for past one and half years to provide assistance. Sister notes considerable variability in memory functions with some days better than others. Strong correlation between good memory and cognition days versus bad memory days, highly correlated with deficits in motor functioning. Consistency between good memory days and good motor functioning days.  CURRENT MEDICATIONS - Current Medications: Exelon  1.5 mg BID since September 2022, sister notes feels like it is helping with memory.  Sentiment (reports feeling it is helping).  PSYCHIATRIC HISTORY - Psychiatric History: Saw therapist about one year ago but did not feel it was particularly helpful. Referred for neuropsychological consultation and consideration of returning to therapeutic interventions due to coping and adjustment issues.  Will also look at formal neuropsychological assessment particularly around memory issues.  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 No seizure-like  abnormalities noted in EEG. Stopped BP medications and symptoms of  orthostasis resolved. Diagnosed with obstructive sleep apnea on CPAP. Had adjustment of CPAP mask due to leak. Has had rigidity, instability, and more recently atypical eye issues. Previous falls back in 10/24/2021 and progressively became more unstable in gait.  DEVELOPMENTAL, SOCIAL AND FAMILY HISTORY Family: - Family of Origin: Born in [place], [place] , with two siblings.  Educational History - Educational History: Graduated with undergraduate degree from [employer] in [place]. Always did well in math classes but had some relative difficulty in English classes.  Employment History - Employment History: Currently not working/retired. 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  Relationship History - Relationship History: Widowed with husband passing away in 24-Oct-2017. Were married for six years with no previous marriages. Has no children.  SUBSTANCE USE - Substance Use: History of 34-year substance abuse. Denies any particular drug of choice but denies ever any knowledge of use of synthesized drugs.  CLINICAL FORMULATION: - Presenting Problem: Client presents with worsening memory functions over past two years, coping and adjustment issues related to neurological condition, and vision changes related to Parkinsonian condition.  - Precipitating Factors: [place] approximately three years ago. Death of spouse in 10-24-17.  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 Obstructive sleep apnea. Previous negative therapeutic experience.  - Protecting Factors: Living with supportive siblings who provide assistance. Participating in physical therapy exercise, fall prevention, and safe ambulation. Medication compliance with Exelon  and L-dopa with  perceived benefit. Willingness to consider therapeutic interventions.  Case formulation: Client presents with worsening memory functions and coping difficulties, which appears to be precipitated by near syncope event three years ago and death of spouse in Oct 24, 2017. 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 The current problem is maintained by ongoing neurological symptoms, obstructive sleep apnea, and previous negative therapeutic experience. However, the protective and positive factors include supportive family living situation, participation in physical therapy, medication compliance with perceived benefit, and willingness to consider therapeutic interventions.  Medical History:   Past Medical History:  Diagnosis Date   Diabetes mellitus without complication (HCC)    Hypertension    Parkinson disease (HCC)    Substance abuse (HCC)          Patient Active Problem List   Diagnosis Date Noted   OSA on CPAP 05/17/2023   Anemia 04/03/2023   Supranuclear palsy (HCC) 04/03/2023   Atypical syncope 03/04/2023   Seizure disorder (HCC) 03/04/2023   Seizure-like activity (HCC) 03/02/2023   Thrombocytosis 12/06/2022   Gait abnormality 12/06/2022   Encounter for annual physical exam 11/24/2022   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   Decreased dorsalis pedis pulse 11/17/2020   Tachycardia 08/11/2020   Cough 08/11/2020   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   Pure hypercholesterolemia 01/23/2020   Vitamin D  deficiency 10/23/2019   Right hip pain 05/15/2018   Estrogen deficiency 05/15/2018   Class 1 obesity due to excess calories with serious comorbidity and body mass index (BMI) of 31.0 to 31.9 in adult 05/15/2018   Diabetes mellitus with stage 2 chronic kidney disease (HCC) 01/11/2018   Benign hypertension with chronic kidney disease, stage II 01/11/2018   Additional  Tests  and Measures from other records:  Neuroimaging Results: Patient had MRI brain with and without contrast performed on 03/03/2023 and interpreted by Lonni Necessary, MD with impressions including no acute intercranial abnormality or significant change from previous MRI with stable mild atrophy and scattered white matter disease with this likely reflecting sequela of chronic microvascular ischemia.  No other acute abnormalities noted.  Behavioral Observation/Mental Status:   April Larson  presents as a 73 y.o.-year-old Right handed African American Female who appeared her stated age. her dress was Appropriate and she was Well Groomed and her manners were Appropriate to the situation.  her participation was indicative of Appropriate and Redirectable behaviors.  There were physical disabilities noted.  she displayed an appropriate level of cooperation and motivation.    Interactions:    Active Redirectable  Attention:   abnormal and attention span appeared shorter than expected for age  Memory:   abnormal; remote memory intact, recent memory impaired  Visuo-spatial:   abnormal  Speech (Volume):  low  Speech:   slurred; normal  Thought Process:  Coherent and Relevant  Concrete and Coherent  Though Content:  WNL; not suicidal and not homicidal  Orientation:   person, place, and situation  Judgment:   Fair  Planning:   Poor  Affect:    Depressed  Mood:    Dysphoric  Insight:   Fair  Intelligence:   normal   Family Med/Psych History:  Family History  Problem Relation Age of Onset   Cancer Mother    Diabetes Father    Cancer Brother    Breast cancer Maternal Aunt    Parkinsonism Neg Hx     Impression/DX:   April Larson is a 73 year old female referred for neuropsychological consultation and consideration of psychotherapeutic interventions for adjustment disorder with depression and anxiety type symptoms, memory problems that have  worsened over the past 2 years.  While objective neuropsychological assessment of memory functions will likely be conducted, the initial focus will be on adjustment and coping issues with her progressively worsening condition and neurological status.  Disposition/Plan:  We have made provisions for the patient to return for psychotherapeutic interventions and further consideration of neuropsychological testing later.  The patient was instructed to call back if she decides to proceed and the patient's sister was encouraging her to proceed with therapeutic interventions.  Diagnosis:    Adjustment disorder with mixed anxiety and depressed mood  Memory loss  Atypical parkinsonism (HCC)  OSA on CPAP  Gait abnormality        Note: This document was prepared using Dragon voice recognition software and may include unintentional dictation errors.   Electronically Signed   _______________________ Norleen Asa, Psy.D. Clinical Neuropsychologist

## 2023-10-10 ENCOUNTER — Other Ambulatory Visit: Payer: Self-pay

## 2023-10-10 MED FILL — Omeprazole Cap Delayed Release 40 MG: ORAL | 30 days supply | Qty: 30 | Fill #2 | Status: AC

## 2023-10-10 MED FILL — Atorvastatin Calcium Tab 10 MG (Base Equivalent): ORAL | 30 days supply | Qty: 30 | Fill #6 | Status: AC

## 2023-10-11 ENCOUNTER — Other Ambulatory Visit: Payer: Self-pay | Admitting: Licensed Clinical Social Worker

## 2023-10-11 ENCOUNTER — Other Ambulatory Visit: Payer: Self-pay

## 2023-10-11 NOTE — Patient Outreach (Signed)
 Complex Care Management   Visit Note  10/11/2023  Name:  April Larson MRN: [id number] DOB: 02/21/1951  Situation: Referral received for Complex Care Management related to [facility] Programs I obtained verbal consent from Patient.  Visit completed with patient and patient sister  on the phone  Background:   Past Medical History:  Diagnosis Date   Diabetes mellitus without complication (HCC)    Hypertension    Parkinson disease (HCC)    Substance abuse (HCC)     Assessment: Patient is awaiting for some to call her back , she and her sister have already spoken to someone regarding the program    Recommendation:   none  Follow Up Plan:   Telephone follow up appointment date/time:  10/28/2023 at 1pm  Tobias CHARM [name], [name] [facility], [facility] Social Worker Direct Dial: [phone]  Fax: [phone]

## 2023-10-13 ENCOUNTER — Other Ambulatory Visit: Payer: Self-pay

## 2023-10-17 ENCOUNTER — Ambulatory Visit (INDEPENDENT_AMBULATORY_CARE_PROVIDER_SITE_OTHER): Payer: 59 | Admitting: Neurology

## 2023-10-17 ENCOUNTER — Encounter: Payer: Self-pay | Admitting: Neurology

## 2023-10-17 VITALS — BP 144/73 | HR 78 | Ht 62.0 in | Wt 149.2 lb

## 2023-10-17 DIAGNOSIS — G4733 Obstructive sleep apnea (adult) (pediatric): Secondary | ICD-10-CM

## 2023-10-17 DIAGNOSIS — G20C Parkinsonism, unspecified: Secondary | ICD-10-CM | POA: Diagnosis not present

## 2023-10-17 DIAGNOSIS — R413 Other amnesia: Secondary | ICD-10-CM | POA: Diagnosis not present

## 2023-10-17 MED ORDER — CARBIDOPA-LEVODOPA 25-100 MG PO TABS
1.0000 | ORAL_TABLET | Freq: Four times a day (QID) | ORAL | 5 refills | Status: AC
  Filled 2023-11-17: qty 120, 30d supply, fill #0
  Filled 2023-12-14: qty 120, 30d supply, fill #1
  Filled 2024-01-12: qty 120, 30d supply, fill #2
  Filled 2024-02-13: qty 120, 30d supply, fill #3
  Filled 2024-03-16: qty 120, 30d supply, fill #4
  Filled 2024-04-17: qty 120, 30d supply, fill #5

## 2023-10-17 MED ORDER — RIVASTIGMINE TARTRATE 3 MG PO CAPS: 3.0000 mg | ORAL_CAPSULE | Freq: Two times a day (BID) | ORAL | 5 refills | Status: DC

## 2023-10-17 NOTE — Progress Notes (Signed)
 Subjective:    Patient ID: April Larson is a 73 y.o. female.  HPI    Interim history:   April Larson is a 73 year old right-handed woman with an underlying medical history of hypertension, diabetes, reflux disease, allergies, sleep apnea and borderline obesity, who presents for follow-up consultation of her parkinsonism.  She is accompanied by her sister, April Larson, again today.  She was last seen in our clinic by Lauraine Born, NP, at which time she felt that her memory was stable, she was on Exelon  1.5 mg twice daily, she was advised to continue with levodopa  1 pill 4 times a day, she was encouraged to be consistent with her AutoPap machine, a mask refit was ordered.  Her MMSE was 25 at the time.  Today, 10/17/23: She reports feeling more or less stable but she has stopped using her CPAP because she was experiencing coughing when she used it.  She has not tried to increase the humidity setting.  She has not fallen, she uses her walker consistently.  She has an appointment with her eye doctor in September.  She recently saw her primary care about a month ago. She also saw Dr. Corina for counseling less than a month ago and this has been helpful, next appointment is in November.  She has been tolerating the Exelon  and would be willing to increase the dose.  I reviewed her most recent CPAP compliance data for the past year, she used her machine about 30% of the time with percent use days greater than 4 hours a little over 20% of the time, no usage in the past nearly 3 months.  CPAP of 9 cm with EPR of 3, average AHI around 3/h during times of usage.   She lives with her  brother.    The patient's allergies, current medications, family history, past medical history, past social history, past surgical history and problem list were reviewed and updated as appropriate.    Previously (copied from previous notes for reference):   05/17/2023 April Born, NP): <<Saw Dr. Buck June  2024, added Exelon  1.5 mg BID for worsening memory.  She wanted her OSA re-evaluated due to weight loss. HST 01/04/2023 showing mild OSA but brief oxygen drops 64% during dream sleep.  Recommended she resume CPAP, her machine was not 72 years old yet.  She presented to the ER in November 2024 for episodes of staring off, concerning for seizure-like activity.  She was connected to long-term EEG, no seizure activity was noted. No spells while wearing EEG.  No medication change.  Felt potentially related to orthostasis.  Her blood pressure medication was discontinued.   Today, MMSE 25/30. Remains on Exelon  1.5 mg BID. April Larson thinks it has helped her memory. Using CPAP, but her mask is leaking. No further seizure like spells reported. BP meds restarted yesterday. While at doctor's office got tangled in her cane, fell onto the carpet, small abrasion above right eye. Remains on Sinemet  25/100 mg 4 times daily. Can tell when she doesn't take it. She lives with her brother.  She has a nurse come out. Brother fixes pill box. She does her own ADLs. Has poor peripheral vision, but can see well straight and close up. Mentions Dr. Octavia is retiring, any other options for anyone who specializes. 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  CPAP report shows a greater than 4-hour usage 87%, set pressure 9 cm water.  AHI 3.6, leak 60.6.   >>  09/13/2022 (SA): She  reports feeling okay, not great.  She feels more forgetful.  Sister endorses that her memory loss has become worse.  She also does not stay very active and does not always follow through with exercises that she was taught in the past by therapists.  She has stopped going to the gym, since they have a break for the summer as far as work is concerned, she indicates that she would be willing to go back to the gym.  She has fallen about 3 times since her last visit.  She has not sustained any major injuries but did scrape her right elbow when she fell out of bed, she lost her  balance as she was getting out of bed.  She does not use a walking aid inside the house but uses a 4-point cane when she goes outside the house but does not actually use the cane, tends to drag it, per sister.  She continues to take Sinemet  4 times a day without side effects.  At the end of this month she is planning to move in with her father into their family home.  April Larson will be about 5 minutes away, currently she is about 14 minutes away from her home.  Patient will be sending her home.    I saw her on 10/08/2021, at which time she reported feeling fairly stable.  She did have a recent fall, she had forgetfulness.   She saw Lauraine Born, NP on 04/15/22, at which time she was fairly stable. Her MMSE was 29/30 at the time.         I saw her on 04/09/2021, at which time she reported not noticing much in the way of benefit with Sinemet , she was taking 1 pill 3 times daily.  She was advised to increase it to 1 pill 4 times daily.  She reported taking longer to process things.   She saw her ophthalmologist in the interim and also neuro-ophthalmologist Dr. Gladis in May 2023.  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 I reviewed the note.  I also reviewed Dr. Milford office visit note.   I made a referral to neuro rehab in the interim in May 2023.     I saw her on 12/24/2020, at which time she felt fairly stable, had no recent falls.  She was still driving but limiting herself to daytime driving and familiar routes.  She was noted to be more forgetful and have a tendency to repeat herself per her sister's observation.  I made a referral to neuro rehab.  She was advised to start Sinemet  gradual titration.   Subsequently, we requested home health therapy   I saw her on 07/23/2020 at the request of her primary care physician, at which time she reported a several month history of difficulty with her balance, recent fall, problems with fine motor control and speech.   She had a DaTscan on 12/19/2020 and I  reviewed the results: IMPRESSION: Decreased radiotracer activity within the striata with greater deficit on the LEFT. Findings are suggestive of Parkinsonian syndrome pathology.   Of note, DaTSCAN is not diagnostic of Parkinsonian syndromes, which remains a clinical diagnosis. DaTscan is an adjuvant test to aid in the clinical diagnosis of Parkinsonian syndromes.   We called her with her test results.     07/23/20: (She) reports a 3+ month history of difficulty with her balance, recent fall with head injury, feeling wobbly, feeling like she could fall again, problems with fine motor control, slowness, change  in her speech.  Her sister reports that she has noticed changes in patient's behavior and that she seems to be less attentive at times, seems to zone out.  She has not had any passing out spells or convulsions or twitching or involuntary movements, no tremors.  However, she did take a significant fall.  Patient reports that she fell when she was working at [employer].  This was on 03/27/2020.  She was in the process of knocking at the door where the ushers are and the next thing she realized was that she was on the floor.  She did not have any warning sign, no lightheadedness, no spinning sensation, no headache.  She was assisted by a nurse that was onsite.  She was taken to the emergency room.  I reviewed the emergency room record from 03/27/2020.  She went to [facility] long [facility] ED.   She did sustain a rib fracture of the left 10th rib.  She reports that after she had her rib fracture, she stopped using her CPAP but otherwise she has been compliant with it.  After about a month she resumed using her CPAP.  She has not had any difficulty swallowing but has noticed changes in her speech.  Her sister reports that she seems to speak very deliberately and slowly and also eats slowly.  Patient reports that her handwriting has changed.  It has become much more tremulous and sloppy.  She is to have a  good penmanship.   I reviewed your office note from 04/15/2020.  You ordered a brain MRI.   She had a brain MRI with and without contrast on 06/07/2020 and I reviewed the results: IMPRESSION: No evidence of recent infarction, hemorrhage, or mass. No abnormal enhancement.   Mild chronic microvascular ischemic changes.    She had recent labs through your office on 04/15/2020.  Vitamin B12 was 1323, TSH was 1.83, urinalysis negative, CMP showed a glucose of 90, BUN 22, creatinine 0.91, alk phos was elevated at 196, AST and ALT normal.  A1c was mildly elevated at 7.1.  CBC showed WBC at 1.1, hemoglobin 12.4, hematocrit 37.7, platelets mildly elevated at 480.   She had a head CT without contrast on 03/27/2020 and I reviewed the results: IMPRESSION: 1. Left frontal scalp soft tissue swelling without underlying fracture. 2. No acute intracranial abnormality. 3. Mild generalized atrophy and white matter disease likely reflects the sequela of chronic microvascular ischemia.   I previously evaluated her over 2 years ago for sleep apnea.  She had a baseline sleep study on 04/10/2018 which showed an AHI of 12.3/h, REM AHI of 24.3/h, O2 nadir 88%.  She had a CPAP titration study on 06/05/2018 at which time she was recommended to start CPAP at 9 cm of water pressure.   She did not return for follow-up.  She has been on CPAP.  Current 30-day compliance showed excellent compliance with an average usage of 5 hours and 48 minutes, residual AHI at goal at 3.3/h, leak on the low side, pressure at 9 cm.  Percent use days greater than 4 hours at 90%.    03/09/18: April Larson is a 73 year old right-handed woman with an underlying medical history of type 2 diabetes, chronic kidney disease, hypertension, seasonal allergies, and obesity, who was previously diagnosed with obstructive sleep apnea. She was placed on CPAP therapy. She has not had re-evaluation in years. Prior sleep study results are not available for my  review today. I reviewed your office  note from 01/12/2018. She was complaining of memory loss. she had blood work through your office on 01/12/2018, including RPR, B12, lipid profile, A1c, CMP, TSH. I reviewed the test results. Blood tests were benign, A1c 6.5. Her Epworth sleepiness score is 16 out of 24, fatigue score is 42 out of 63. She reports that she could not tolerate CPAP in the past. She reports that she had a sinus infection after she started using CPAP. She estimates that this was about 15 years ago. She is widowed; sadly, her husband passed away in May 07, 2017. She lives alone, no children, has 1 dog in the household, he sleeps on the bed with her. She is retired. She quit smoking in 2004, does not utilize alcohol and does not drink caffeine on a regular basis. Her bedtime is around 11:30 and she is typically asleep at midnight. She does have a TV on in her bedroom which tends to stay on all night. Rise time is around 6:50 AM. She has nocturia about once per average night and denies recurrent morning headaches. She is not aware of any family history of OSA. She typically takes a nap for an hour around 4 PM daily. She tried a fullface mask in the past for her CPAP machine and it was uncomfortable. She would be willing to get retested and consider CPAP therapy again. She has had some short-term memory issues.   Her Past Medical History Is Significant For: Past Medical History:  Diagnosis Date   Diabetes mellitus without complication (HCC)    Hypertension    Parkinson disease (HCC)    Substance abuse (HCC)     Her Past Surgical History Is Significant For: Past Surgical History:  Procedure Laterality Date   EYE SURGERY      Her Family History Is Significant For: Family History  Problem Relation Age of Onset   Cancer Mother    Diabetes Father    Cancer Brother    Breast cancer Maternal Aunt    Parkinsonism Neg Hx    Sleep apnea Neg Hx     Her Social History Is Significant  For: Social History   Socioeconomic History   Marital status: Widowed    Spouse name: Not on file   Number of children: Not on file   Years of education: Not on file   Highest education level: Not on file  Occupational History   Occupation: retired  Tobacco Use   Smoking status: Former    Current packs/day: 0.00    Average packs/day: 1 pack/day for 37.0 years (37.0 ttl pk-yrs)    Types: Cigarettes    Start date: 53    Quit date: 04/08/2005    Years since quitting: 18.5   Smokeless tobacco: Never  Vaping Use   Vaping status: Never Used  Substance and Sexual Activity   Alcohol use: No    Alcohol/week: 0.0 standard drinks of alcohol   Drug use: No   Sexual activity: Not Currently  Other Topics Concern   Not on file  Social History Narrative   Pt lives with family    Retired    Social Drivers of Corporate investment banker Strain: Low Risk  (09/28/2023)   Overall Financial Resource Strain (CARDIA)    Difficulty of Paying Living Expenses: Not hard at all  Food Insecurity: No Food Insecurity (09/28/2023)   Hunger Vital Sign    Worried About Running Out of Food in the Last Year: Never true    Ran Out of  Food in the Last Year: Never true  Transportation Needs: No Transportation Needs (09/28/2023)   PRAPARE - Administrator, Civil Service (Medical): No    Lack of Transportation (Non-Medical): No  Physical Activity: Insufficiently Active (08/10/2023)   Exercise Vital Sign    Days of Exercise per Week: 7 days    Minutes of Exercise per Session: 10 min  Stress: No Stress Concern Present (08/10/2023)   [employer] of Occupational Health - Occupational Stress Questionnaire    Feeling of Stress : Not at all  Social Connections: Moderately Integrated (08/10/2023)   Social Connection and Isolation Panel    Frequency of Communication with Friends and Family: More than three times a week    Frequency of Social Gatherings with Friends and Family: More than three times a  week    Attends Religious Services: More than 4 times per year    Active Member of [employer] or Organizations: Yes    Attends Banker Meetings: More than 4 times per year    Marital Status: Widowed    Her Allergies Are:  No Known Allergies:   Her Current Medications Are:  Outpatient Encounter Medications as of 10/17/2023  Medication Sig   ACCU-CHEK FASTCLIX LANCETS MISC USE TO CHECK BLOOD SUGARS TWICE DAILY   Alcohol Swabs (B-D SINGLE USE SWABS REGULAR) PADS USE AS NEEDED.   aspirin  EC 81 MG tablet Take 1 tablet (81 mg total) by mouth daily.   atorvastatin  (LIPITOR) 10 MG tablet Take 1 tablet (10 mg total) by mouth daily.   Biotin  10 MG CAPS Take 1 tablet by mouth daily. (Patient taking differently: Take 10 mg by mouth See admin instructions. Take 10 mg by mouth at 10:30 AM)   Blood Glucose Monitoring Suppl (TRUE METRIX AIR GLUCOSE METER) DEVI USE AS DIRECTED TO CHECK BLOOD SUGARS.   Blood Glucose Monitoring Suppl (TRUE METRIX METER) w/Device KIT Use as directed to check blood sugar once daily   calcium  carbonate (OSCAL) 1500 (600 Ca) MG TABS tablet Take 1 tablet (1,500 mg total) by mouth daily with breakfast. (Patient taking differently: Take 1,500 mg by mouth See admin instructions. Take 1,500 mg by mouth at 10:30 AM)   carbidopa -levodopa  (SINEMET  IR) 25-100 MG tablet Take 1 tablet by mouth 4 (four) times daily (1 tablet before breakfast, 1 tablet at noon, 1 tablet in the evening, and 1 tablet at bedtime).   Cholecalciferol  (VITAMIN D3) 125 MCG (5000 UT) CAPS Take 5,000 Units by mouth See admin instructions. Take 5,000 units by mouth at 10:30 AM   clopidogrel  (PLAVIX ) 75 MG tablet Take 75 mg by mouth See admin instructions. Take 75 mg by mouth at 3:30 PM   diclofenac  Sodium (VOLTAREN ) 1 % GEL APPLY 2 GRAMS TOPICALLY TO THE AFFECTED AREA FOUR TIMES DAILY (Patient taking differently: Apply 2 g topically See admin instructions. Apply 2 grams to the neck, under the breasts, and to the  back 2 times a day)   fluticasone  (FLONASE ) 50 MCG/ACT nasal spray Place 2 sprays into both nostrils daily. (Patient taking differently: Place 2 sprays into both nostrils in the morning.)   glucose blood (TRUE METRIX BLOOD GLUCOSE TEST) test strip Use as instructed   Insulin  Pen Needle (B-D ULTRAFINE III SHORT PEN) 31G X 8 MM MISC Use as directed   loratadine  (CLARITIN ) 10 MG tablet Take 1 tablet by mouth daily (Patient taking differently: Take 10 mg by mouth in the morning. Take 10 mg by mouth at 10:30 AM)  MAGNESIUM  PO Take 1 tablet by mouth at bedtime.   Multiple Vitamins-Minerals (ONE-A-DAY WOMENS 50+ ADVANTAGE) TABS Take 1 tablet by mouth daily (Patient taking differently: Take 1 tablet by mouth See admin instructions. Take 1 tablet by mouth at 10:30 AM)   Omega-3 Fatty Acids (FISH OIL ) 1000 MG CAPS Take 1 capsule by mouth daily (Patient taking differently: Take 1,000 mg by mouth See admin instructions. Take 1,000 mg by mouth at 3 PM)   omeprazole  (PRILOSEC) 40 MG capsule Take 1 capsule (40 mg total) by mouth daily before breakfast.   OZEMPIC , 0.25 OR 0.5 MG/DOSE, 2 MG/3ML SOPN INJECT 0.25MG  UNDER THE SKIN ONCE WEEKLY   rivastigmine  (EXELON ) 1.5 MG capsule Take 1 capsule (1.5 mg total) by mouth 2 (two) times daily.   telmisartan -hydrochlorothiazide  (MICARDIS  HCT) 40-12.5 MG tablet Take 1 tablet by mouth every morning.   TRUEplus Lancets 28G MISC USE AS DIRECTED TO CHECK BLOOD SUGARS.   Turmeric 500 MG CAPS Take 1 capsule by mouth 2 (two) times daily. (Patient taking differently: Take 500 mg by mouth See admin instructions. Take 500 mg by mouth at 7 AM and 4:30 PM)   TYLENOL  8 HOUR ARTHRITIS PAIN 650 MG CR tablet Take 1,300 mg by mouth at bedtime.   vitamin C  (ASCORBIC ACID ) 500 MG tablet Take 1 tablet (500 mg total) by mouth daily. (Patient taking differently: Take 500 mg by mouth See admin instructions. Take 500 mg by mouth at 10:30 AM)   No facility-administered encounter medications on file  as of 10/17/2023.  :  Review of Systems:  Out of a complete 14 point review of systems, all are reviewed and negative with the exception of these symptoms as listed below:  Review of Systems  Neurological:        Pt here for Parkinson and cpap f/u  Pt states hasn't worn cpap in 4 months Pt states vision is declining Pt states unable to to focus eyes . Sister states pt doesn't have any peripheral vision and no able to see anything looking up or down Pt speech is slower.sister states 1 fall in last six months Pt states aspirating when eating      Objective:  Neurological Exam  Physical Exam Physical Examination:   Vitals:   10/17/23 1322  BP: (!) 144/73  Pulse: 78    General Examination: The patient is a very pleasant 73 y.o. female in no acute distress. She is well-groomed.    HEENT: Normocephalic, atraumatic, pupils are equal, round and reactive to light, extraocular tracking is severely impaired with limited motility noted in any direction.  Has a tendency to look up the whole time.  Decrease in eye blink rate noted and moderate facial masking noted, stable.  She has moderate nuchal rigidity and decreased active range of motion in her neck.  She has at times very slow speech, and overall mild hypophonia.  She has mild dysarthria at times.  She has no obvious sialorrhea, in fact, thicker saliva noted.  Tongue protrudes centrally and palate elevates symmetrically, no carotid bruits.    Chest: Clear to auscultation without wheezing, rhonchi or crackles noted.   Heart: S1+S2+0, regular and normal without murmurs, rubs or gallops noted.    Abdomen: Soft, non-tender and non-distended.   Extremities: There is no pitting edema in the distal lower extremities bilaterally.    Skin: Warm and dry without trophic changes noted.   Musculoskeletal: exam reveals no obvious joint deformities.   Neurologically:  Mental status: The patient  is awake, alert and oriented in all 4 spheres. Her  immediate and remote memory, attention, language skills and fund of knowledge are mildly impaired, very slow to respond with significant bradyphrenia noted, sister supplements history.     05/17/2023    3:42 PM 09/13/2022    2:10 PM 09/13/2022    1:40 PM 04/15/2022    3:00 PM 10/08/2021   10:59 AM 12/24/2020   10:28 AM  MMSE - Mini Mental State Exam  Orientation to time 5 5 5 5 5 5   Orientation to Place 3 5 5 5 5 5   Registration 3 3 3 3 3 3   Attention/ Calculation 3 3 3 5 4 3   Recall 3 2 2 3 3 3   Language- name 2 objects 2 2 2 2 2 2   Language- repeat 1 1 1 1 1 1   Language- follow 3 step command 3 3 3 3 3 3   Language- read & follow direction 1 1 1 1 1 1   Write a sentence 1 1 1 1 1 1   Copy design 0 0  0 0 0  Total score 25 26  29 28 27     On 12/24/2020: CDT: 3/4, AFT: 9/min.   On 10/08/2021: CDT: 3.5/4, AFT: 6/min.   Cranial nerves II - XII are as described above under HEENT exam.  Motor exam: Normal bulk, global strength of 4+ out of 5, increase in tone in the upper extremities, no obvious cogwheeling noted.  No resting tremor noted. Romberg is not tested due to safety concerns.  Moderate bradykinesia.  Fine motor skill testing reveals moderate to severe impairment on the right and left side.  Cerebellar testing: No dysmetria or intention tremor.  Sensory exam: intact to light touch.  Gait, station and balance: She stands with mild difficulty and pushes herself up.  Posture is mildly stooped, stance with mildly wider based, left shoulder slightly higher than right. She walks slowly and cautiously, she uses a 2 wheeled walker.     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   She had a DaTscan on  12/19/2020 which showed decreased radiotracer activity within the striata with greater deficit on the left.   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   She had a brain MRI which showed relatively benign findings.    We started Sinemet  in September 2022.  We increased this to 1 pill 4 times daily in January 2023.  She tolerates the medication and feels that it continues to be beneficial at this time. She has benefited from therapy as well.  She is advised to continue with Sinemet  1 pill 4 times daily.    She is encouraged to continue with her exercise regimen at home as instructed by her therapist.    Her memory has worsened, she feels more forgetful.  She started low-dose Exelon  in June 2024 and continues to tolerate the medication, I recommended today that we increase it to 3 mg twice daily at this time.  We talked about expectations and possible side effects again today.    She is not currently using her CPAP but is encouraged to get back on it.  She may be able to tolerate it  better with a higher humidity setting, her sister or her brother can help her adjust the humidity on the machine.    Thankfully, patient has a good support system. She currently lives with her youngest brother.  Her sister sees her daily or nearly daily.  We talked about the importance of fall prevention again today.  She is advised to use her walker consistently.  Her sister is trying to get her enrolled in the atypical parkinsonism support group locally.   Patient is advised to keep her appointment with her ophthalmologist and her other providers as scheduled/planned.  She is advised to follow-up in this clinic to see one of our nurse practitioners in about 6 months, sooner if needed.  I answered all their questions today and the patient and her sister were in agreement.    I spent 40 minutes in total face-to-face time and in reviewing records during  pre-charting, more than 50% of which was spent in counseling and coordination of care, reviewing test results, reviewing medications and treatment regimen and/or in discussing or reviewing the diagnosis of PSP, OSA, the prognosis and treatment options. Pertinent laboratory and imaging test results that were available during this visit with the patient were reviewed by me and considered in my medical decision making (see chart for details).

## 2023-10-17 NOTE — Patient Instructions (Signed)
 Please continue using your CPAP regularly. While your insurance requires that you use CPAP at least 4 hours each night on 70% of the nights, I recommend, that you not skip any nights and use it throughout the night if you can. Getting used to CPAP and staying with the treatment long term does take time and patience and discipline. Untreated obstructive sleep apnea when it is moderate to severe can have an adverse impact on cardiovascular health and raise her risk for heart disease, arrhythmias, hypertension, congestive heart failure, stroke and diabetes. Untreated obstructive sleep apnea causes sleep disruption, nonrestorative sleep, and sleep deprivation. This can have an impact on your day to day functioning and cause daytime sleepiness and impairment of cognitive function, memory loss, mood disturbance, and problems focussing. Using CPAP regularly can improve these symptoms.  We will increase your Exelon  to 3 mg twice daily and continue with Sinemet  generic 1 pill 4 times a day.

## 2023-10-18 NOTE — Patient Instructions (Signed)
 Visit Information  Thank you for taking time to visit with me today. Please don't hesitate to contact me if I can be of assistance to you before our next scheduled appointment.  Our next appointment is by telephone on 10/28/2023 at 1:00 pm Please call the care guide team at [phone] if you need to cancel or reschedule your appointment.   Following is a copy of your care plan:   Goals Addressed             This Visit's Progress    BSW VBCI Social Work Care Plan       Problems:   Social Connections will follow up with A-Typical Parkinson Support Group at [facility] as directed by Social Work  CSW Clinical Goal(s):   Over the next 2 weeks the Patient will will follow up with A-Typical Parkinson Support Group at [facility] as directed by Social Work.  Interventions:  SW will email the information how to connect and register with the support Group and will get there patient on the email for the senior services list serve.   Patient Goals/Self-Care Activities:  Coordinate with [facility]  to assist with support programs.  Plan:   Telephone follow up appointment with care management team member scheduled for:  10/28/2023 at 1:00 pm        Please call the Suicide and Crisis Lifeline: 988 go to [facility] [address] [place] [phone]) call 911 if you are experiencing a Mental Health or Behavioral Health Crisis or need someone to talk to.  The patient verbalized understanding of instructions, educational materials, and care plan provided today and DECLINED offer to receive copy of patient instructions, educational materials, and care plan.  April CHARM [name], [name] [facility], [facility] Social Worker Direct Dial: [phone]  Fax: [phone]

## 2023-10-18 NOTE — Patient Outreach (Signed)
 Complex Care Management   Visit Note  10/11/2023  Name:  April Larson MRN: [id number] DOB: [birth year]  Situation: Referral received for Complex Care Management related to SDOH Barriers:  Social Isolation I obtained verbal consent from Patient.  Visit completed with patient  on the phone  Background:   Past Medical History:  Diagnosis Date   Diabetes mellitus without complication (HCC)    Hypertension    Parkinson disease (HCC)    Substance abuse (HCC)     Assessment:Patient has contacted the A-typical program at [facility] and is waiting for someone to call back from the program to provide more detail   Recommendation:   none  Follow Up Plan:   Telephone follow up appointment date/time:  10/28/2023 at 1:00 pm  Tobias CHARM [name], [name] [facility], [facility] Social Worker Direct Dial: [phone]  Fax: [phone]

## 2023-10-19 NOTE — Patient Outreach (Signed)
 Complex Care Management   Visit Note  10/11/2023  Name:  April Larson MRN: [id number] DOB: Nov 30, 1950  Situation: Referral received for Complex Care Management related to looking for A Typical program  I obtained verbal consent from Patient.  Visit completed with patient and sister  on the phone  Background:   Past Medical History:  Diagnosis Date   Diabetes mellitus without complication (HCC)    Hypertension    Parkinson disease (HCC)    Substance abuse (HCC)     Assessment: Family is looking for a social group that is for individuals that are atypical  Recommendation:   none  Follow Up Plan:   Telephone follow up appointment date/time:  10/28/2023 at 1pm  Tobias CHARM [name], [name] [facility], [facility] Social Worker Direct Dial: [phone]  Fax: [phone]

## 2023-10-25 ENCOUNTER — Other Ambulatory Visit: Payer: Self-pay

## 2023-10-25 ENCOUNTER — Other Ambulatory Visit: Payer: Self-pay | Admitting: Internal Medicine

## 2023-10-27 ENCOUNTER — Other Ambulatory Visit: Payer: Self-pay

## 2023-10-28 ENCOUNTER — Other Ambulatory Visit: Payer: Self-pay | Admitting: Licensed Clinical Social Worker

## 2023-10-28 NOTE — Patient Outreach (Signed)
 Complex Care Management   Visit Note  10/28/2023  Name:  April Larson MRN: [id number] DOB: [birth year]  Situation: Referral received for Complex Care Management related to SDOH Barriers:  Social Isolation I obtained verbal consent from Patient.  Visit completed with patient  on the phone  Background:   Past Medical History:  Diagnosis Date   Diabetes mellitus without complication (HCC)    Hypertension    Parkinson disease (HCC)    Substance abuse (HCC)     Assessment: Patient stated that they are still waiting for the A-typical program to call them back, but she was not sure if they called her daughter April Larson), April Larson was there but she was on the phone and the patient wanted the SW to also speak with her daughter and the SW stated that she will call back at 3:30 pm on today. The SW attempted to call April Larson at [phone] and had to leave a message, SW rescheduled to 11/04/2023 at 10:00 pm. SW attempted to call the patient to inform her about the new date that the SW will be calling, SW had to leave a message.    SDOH Interventions    Flowsheet Row Patient Outreach Telephone from 09/27/2023 in [place] POPULATION HEALTH DEPARTMENT Clinical Support from 08/10/2023 in [facility] Telephone from 03/08/2023 in [place] POPULATION HEALTH DEPARTMENT ED to Hosp-Admission (Discharged) from 03/02/2023 in [place] [facility]W Medical Specialty PCU Clinical Support from 08/04/2022 in [facility] Chronic Care Management from 10/28/2021 in [facility]  SDOH Interventions        Food Insecurity Interventions Intervention Not Indicated Intervention Not Indicated Intervention Not Indicated Intervention Not Indicated Intervention Not Indicated [id number] Referral, Other (Comment)  [Placed referral to mobile meals,  sent info about [name]]  Housing Interventions Intervention Not  Indicated Intervention Not Indicated Intervention Not Indicated Intervention Not Indicated Intervention Not Indicated --  Transportation Interventions Intervention Not Indicated Intervention Not Indicated Intervention Not Indicated Intervention Not Indicated Intervention Not Indicated Intervention Not Indicated  Utilities Interventions Intervention Not Indicated Intervention Not Indicated Intervention Not Indicated Intervention Not Indicated Intervention Not Indicated --  Alcohol Usage Interventions -- Intervention Not Indicated (Score <7) -- -- -- --  Depression Interventions/Treatment  -- PHQ2-9 Score <4 Follow-up Not Indicated -- -- -- --  Financial Strain Interventions Intervention Not Indicated Intervention Not Indicated -- -- Intervention Not Indicated --  Physical Activity Interventions -- Intervention Not Indicated -- -- Patient Refused, Other (Comments) --  Stress Interventions -- Intervention Not Indicated -- -- Intervention Not Indicated --  Social Connections Interventions -- Intervention Not Indicated -- -- -- --  Health Literacy Interventions -- Intervention Not Indicated -- -- -- --    Recommendation:   none  Follow Up Plan:   Telephone follow up appointment date/time:  11/04/2023 at 10:00 am  April CHARM [name], [name] [facility], [facility] Social Worker Direct Dial: [phone]  Fax: [phone]

## 2023-10-28 NOTE — Patient Instructions (Signed)
 Visit Information  Thank you for taking time to visit with me today. Please don't hesitate to contact me if I can be of assistance to you before our next scheduled appointment.  Your next care management appointment is by telephone on 11/04/2023 at 10:00 am   Please call the care guide team at [phone] if you need to cancel, schedule, or reschedule an appointment.   Please call the Suicide and Crisis Lifeline: 988 go to [facility] [address], [place] [phone]) call 911 if you are experiencing a Mental Health or Behavioral Health Crisis or need someone to talk to.  Tobias CHARM [name], [name] [facility], [facility] Social Worker Direct Dial: [phone]  Fax: [phone]

## 2023-11-04 ENCOUNTER — Other Ambulatory Visit: Payer: Self-pay | Admitting: Licensed Clinical Social Worker

## 2023-11-04 NOTE — Patient Instructions (Signed)

## 2023-11-04 NOTE — Patient Outreach (Signed)
 Complex Care Management   Visit Note  11/04/2023  Name:  April Larson MRN: [id number] DOB: February 20, 1951  Situation: Referral received for Complex Care Management related to SDOH Barriers:  Social Isolation Physical Activity I obtained verbal consent from Caregiver Patient.  Visit completed with PATIENTS SISTER April Larson   on the phone  Background:   Past Medical History:  Diagnosis Date   Diabetes mellitus without complication (HCC)    Hypertension    Parkinson disease (HCC)    Substance abuse (HCC)     Assessment: the program at come for A-typical patients has not been up and running in 2 years according to what in the patient and her sister found out. Family is going to enroll patient into the [facility] program.    SDOH Interventions    Flowsheet Row Patient Outreach Telephone from 09/27/2023 in [place] POPULATION HEALTH DEPARTMENT Clinical Support from 08/10/2023 in [facility] Telephone from 03/08/2023 in [place] POPULATION HEALTH DEPARTMENT ED to Hosp-Admission (Discharged) from 03/02/2023 in [place] [facility]W Medical Specialty PCU Clinical Support from 08/04/2022 in [facility] Chronic Care Management from 10/28/2021 in [facility]  SDOH Interventions        Food Insecurity Interventions Intervention Not Indicated Intervention Not Indicated Intervention Not Indicated Intervention Not Indicated Intervention Not Indicated [id number] Referral, Other (Comment)  [Placed referral to mobile meals,  sent info about [name]]  Housing Interventions Intervention Not Indicated Intervention Not Indicated Intervention Not Indicated Intervention Not Indicated Intervention Not Indicated --  Transportation Interventions Intervention Not Indicated Intervention Not Indicated Intervention Not Indicated Intervention Not Indicated Intervention Not Indicated Intervention Not Indicated  Utilities  Interventions Intervention Not Indicated Intervention Not Indicated Intervention Not Indicated Intervention Not Indicated Intervention Not Indicated --  Alcohol Usage Interventions -- Intervention Not Indicated (Score <7) -- -- -- --  Depression Interventions/Treatment  -- PHQ2-9 Score <4 Follow-up Not Indicated -- -- -- --  Financial Strain Interventions Intervention Not Indicated Intervention Not Indicated -- -- Intervention Not Indicated --  Physical Activity Interventions -- Intervention Not Indicated -- -- Patient Refused, Other (Comments) --  Stress Interventions -- Intervention Not Indicated -- -- Intervention Not Indicated --  Social Connections Interventions -- Intervention Not Indicated -- -- -- --  Health Literacy Interventions -- Intervention Not Indicated -- -- -- --      Recommendation:   none  Follow Up Plan:   Closing From:  Complex Care Management  Tobias CHARM [name], [name] [facility], [facility] Social Worker Direct Dial: [phone]  Fax: [phone]

## 2023-11-15 ENCOUNTER — Other Ambulatory Visit: Payer: Self-pay | Admitting: Internal Medicine

## 2023-11-17 ENCOUNTER — Other Ambulatory Visit (HOSPITAL_COMMUNITY): Payer: Self-pay

## 2023-11-17 ENCOUNTER — Other Ambulatory Visit: Payer: Self-pay

## 2023-11-17 MED FILL — Atorvastatin Calcium Tab 10 MG (Base Equivalent): ORAL | 30 days supply | Qty: 30 | Fill #7 | Status: AC

## 2023-11-17 MED FILL — Omeprazole Cap Delayed Release 40 MG: ORAL | 30 days supply | Qty: 30 | Fill #3 | Status: AC

## 2023-11-18 ENCOUNTER — Other Ambulatory Visit: Payer: Self-pay

## 2023-11-18 ENCOUNTER — Other Ambulatory Visit (HOSPITAL_COMMUNITY): Payer: Self-pay

## 2023-11-19 ENCOUNTER — Other Ambulatory Visit (HOSPITAL_COMMUNITY): Payer: Self-pay

## 2023-11-29 ENCOUNTER — Encounter: Payer: Self-pay | Admitting: Internal Medicine

## 2023-11-29 ENCOUNTER — Ambulatory Visit: Payer: Medicare PPO | Admitting: Internal Medicine

## 2023-11-29 VITALS — BP 130/60 | HR 93 | Temp 98.2°F | Ht 62.0 in | Wt 149.0 lb

## 2023-11-29 DIAGNOSIS — I129 Hypertensive chronic kidney disease with stage 1 through stage 4 chronic kidney disease, or unspecified chronic kidney disease: Secondary | ICD-10-CM

## 2023-11-29 DIAGNOSIS — Z79899 Other long term (current) drug therapy: Secondary | ICD-10-CM

## 2023-11-29 DIAGNOSIS — E7800 Pure hypercholesterolemia, unspecified: Secondary | ICD-10-CM | POA: Diagnosis not present

## 2023-11-29 DIAGNOSIS — E1122 Type 2 diabetes mellitus with diabetic chronic kidney disease: Secondary | ICD-10-CM

## 2023-11-29 DIAGNOSIS — N182 Chronic kidney disease, stage 2 (mild): Secondary | ICD-10-CM | POA: Diagnosis not present

## 2023-11-29 DIAGNOSIS — Z0000 Encounter for general adult medical examination without abnormal findings: Secondary | ICD-10-CM

## 2023-11-29 DIAGNOSIS — G4733 Obstructive sleep apnea (adult) (pediatric): Secondary | ICD-10-CM

## 2023-11-29 DIAGNOSIS — K760 Fatty (change of) liver, not elsewhere classified: Secondary | ICD-10-CM

## 2023-11-29 DIAGNOSIS — E2839 Other primary ovarian failure: Secondary | ICD-10-CM

## 2023-11-29 LAB — FIB-4 W/REFLEX TO ELF
ALT: 6 IU/L (ref 0–32)
AST: 7 IU/L (ref 0–40)
FIB-4 Index: 0.53 (ref 0.00–2.67)
Platelets: 383 x10E3/uL (ref 150–450)

## 2023-11-29 NOTE — Assessment & Plan Note (Signed)

## 2023-11-29 NOTE — Assessment & Plan Note (Addendum)
 Chronic, diabetic foot exam was performed.  Blood sugars range from 112 to 151 mg/dL. A1c at 8.4%. Target A1c below 8%, ideally below 7.5%. - Continue Ozempic  0.5 mg. - Schedule diabetes check in three months. - Check cholesterol and B12 levels. - Encourage regular exercise and dietary modifications.

## 2023-11-29 NOTE — Assessment & Plan Note (Signed)
 Chronic, currently on atorvastatin 10mg  daily.  LDL goal is less than 70. Importance of medication/dietary compliance was discussed with the patient.

## 2023-11-29 NOTE — Progress Notes (Signed)
 I,[name], CMA,acting as a Neurosurgeon for [name], MD.,have documented all relevant documentation on the behalf of [name], MD,as directed by  [name], MD while in the presence of [name], MD.  Subjective:    Patient ID: April Larson , female    DOB: 1951/02/01 , 73 y.o.   MRN: [id number]  Chief Complaint  Patient presents with   Annual Exam    The patient is here today for a physical examination. She is followed by GYN in [place]. She can't recall her last visit. She is accompanied by her sister today. She reports compliance with meds. She denies headaches, chest pain and/or shortness of breath. Patient is concerned about her blood sugars being all over the place.    Diabetes   Hypertension    HPI Discussed the use of AI scribe software for clinical note transcription with the patient, who gave verbal consent to proceed.  History of Present Illness April Larson is a 73 year old female with diabetes who presents for a physical diabetes check.  She is accompanied by her sister today.   Blood sugars have been fluctuating, with recent readings ranging from 112 to 151 mg/dL in the mornings. Her last A1c was 8.4. She has improved her diet since the last visit. Current medications include aspirin , atorvastatin  10 mg daily, biotin , Sinemet  four times a day, Ozempic  0.5 mg, omeprazole , Exelon  (rivastigmine ) twice daily, and telmisartan  40/12.5 mg daily. She is not currently taking Plavix  as it was not refilled by her previous doctor.  She experiences decreased mobility in public settings, such as church, where she requires assistance and does not smile. However, her sister states she gets around quite well when at home. She exercises three to four days a week, performing squats and pull-ups independently.  She has hepatic steatosis, identified through an ultrasound due to elevated liver enzymes. She denies eating candy and other  sweets on a regular basis.  She is not using her CPAP machine due to incorrect tubing and discomfort with wearing the mask.  She experiences bowel movements every other day or every two days and drinks four bottles of water daily. Her diet includes bacon and cheese, and her brother assists with cooking.  She is due for a mammogram in October and a bone density test. She had an eye exam scheduled for September due to insurance issues. No tingling in her hands or feet.   Hypertension This is a chronic problem. The current episode started more than 1 year ago. The problem is controlled (FAIR CONTROL). Pertinent negatives include no anxiety or blurred vision. Risk factors for coronary artery disease include diabetes mellitus, dyslipidemia and post-menopausal state. The current treatment provides moderate improvement. Compliance problems include exercise.  Hypertensive end-organ damage includes kidney disease.  Diabetes She presents for her follow-up diabetic visit. She has type 2 diabetes mellitus. Her disease course has been stable. There are no hypoglycemic associated symptoms. Pertinent negatives for diabetes include no blurred vision. There are no hypoglycemic complications. Diabetic complications include nephropathy. Risk factors for coronary artery disease include diabetes mellitus, dyslipidemia, hypertension, post-menopausal and sedentary lifestyle. Current diabetic treatment includes insulin  injections. She is compliant with treatment most of the time. Her weight is fluctuating minimally. She is following a diabetic diet. She participates in exercise intermittently. Her home blood glucose trend is fluctuating minimally. Her breakfast blood glucose is taken between 8-9 am. Her breakfast blood glucose range is generally 70-90  mg/dl. An ACE inhibitor/angiotensin II receptor blocker is being taken.     Past Medical History:  Diagnosis Date   Diabetes mellitus without complication (HCC)     Hypertension    Parkinson disease (HCC)    Substance abuse (HCC)      Family History  Problem Relation Age of Onset   Cancer Mother    Diabetes Father    Cancer Brother    Breast cancer Maternal Aunt    Parkinsonism Neg Hx    Sleep apnea Neg Hx      Current Outpatient Medications:    ACCU-CHEK FASTCLIX LANCETS MISC, USE TO CHECK BLOOD SUGARS TWICE DAILY, Disp: 100 each, Rfl: 11   Alcohol Swabs (B-D SINGLE USE SWABS REGULAR) PADS, USE AS NEEDED., Disp: 100 each, Rfl: 0   aspirin  EC 81 MG tablet, Take 1 tablet (81 mg total) by mouth daily., Disp: 90 tablet, Rfl: 1   atorvastatin  (LIPITOR) 10 MG tablet, Take 1 tablet (10 mg total) by mouth daily., Disp: 90 tablet, Rfl: 3   Biotin  10 MG CAPS, Take 1 tablet by mouth daily. (Patient taking differently: Take 10 mg by mouth See admin instructions. Take 10 mg by mouth at 10:30 AM), Disp: 90 capsule, Rfl: 1   Blood Glucose Monitoring Suppl (TRUE METRIX AIR GLUCOSE METER) DEVI, USE AS DIRECTED TO CHECK BLOOD SUGARS., Disp: 1 each, Rfl: 0   Blood Glucose Monitoring Suppl (TRUE METRIX METER) w/Device KIT, Use as directed to check blood sugar once daily, Disp: 1 kit, Rfl: 1   calcium  carbonate (OSCAL) 1500 (600 Ca) MG TABS tablet, Take 1 tablet (1,500 mg total) by mouth daily with breakfast. (Patient taking differently: Take 1,500 mg by mouth See admin instructions. Take 1,500 mg by mouth at 10:30 AM), Disp: 90 tablet, Rfl: 1   carbidopa -levodopa  (SINEMET  IR) 25-100 MG tablet, Take 1 tablet by mouth 4 (four) times daily (1 tablet before breakfast, 1 tablet at noon, 1 tablet in the evening, and 1 tablet at bedtime)., Disp: 120 tablet, Rfl: 5   Cholecalciferol  (VITAMIN D3) 125 MCG (5000 UT) CAPS, Take 5,000 Units by mouth See admin instructions. Take 5,000 units by mouth at 10:30 AM, Disp: , Rfl:    diclofenac  Sodium (VOLTAREN ) 1 % GEL, APPLY 2 GRAMS TOPICALLY TO THE AFFECTED AREA FOUR TIMES DAILY (Patient taking differently: Apply 2 g topically See admin  instructions. Apply 2 grams to the neck, under the breasts, and to the back 2 times a day), Disp: 100 g, Rfl: 2   fluticasone  (FLONASE ) 50 MCG/ACT nasal spray, Place 2 sprays into both nostrils daily. (Patient taking differently: Place 2 sprays into both nostrils in the morning.), Disp: 16 g, Rfl: 2   glucose blood (TRUE METRIX BLOOD GLUCOSE TEST) test strip, Use as instructed, Disp: 100 each, Rfl: 12   Insulin  Pen Needle (B-D ULTRAFINE III SHORT PEN) 31G X 8 MM MISC, Use as directed, Disp: 100 each, Rfl: 11   loratadine  (CLARITIN ) 10 MG tablet, Take 1 tablet by mouth daily (Patient taking differently: Take 10 mg by mouth in the morning. Take 10 mg by mouth at 10:30 AM), Disp: 90 tablet, Rfl: 1   MAGNESIUM  PO, Take 1 tablet by mouth at bedtime., Disp: , Rfl:    Multiple Vitamins-Minerals (ONE-A-DAY WOMENS 50+ ADVANTAGE) TABS, Take 1 tablet by mouth daily (Patient taking differently: Take 1 tablet by mouth See admin instructions. Take 1 tablet by mouth at 10:30 AM), Disp: 90 tablet, Rfl: 1   Omega-3 Fatty  Acids (FISH OIL ) 1000 MG CAPS, Take 1 capsule by mouth daily (Patient taking differently: Take 1,000 mg by mouth See admin instructions. Take 1,000 mg by mouth at 3 PM), Disp: 90 capsule, Rfl: 1   omeprazole  (PRILOSEC) 40 MG capsule, Take 1 capsule (40 mg total) by mouth daily before breakfast., Disp: 90 capsule, Rfl: 2   OZEMPIC , 0.25 OR 0.5 MG/DOSE, 2 MG/3ML SOPN, INJECT 0.25MG  UNDER THE SKIN ONCE WEEKLY, Disp: 3 mL, Rfl: 0   rivastigmine  (EXELON ) 3 MG capsule, Take 1 capsule (3 mg total) by mouth 2 (two) times daily., Disp: 60 capsule, Rfl: 5   telmisartan -hydrochlorothiazide  (MICARDIS  HCT) 40-12.5 MG tablet, Take 1 tablet by mouth every morning., Disp: 90 tablet, Rfl: 1   TRUEplus Lancets 28G MISC, USE AS DIRECTED TO CHECK BLOOD SUGARS., Disp: 100 each, Rfl: 1   Turmeric 500 MG CAPS, Take 1 capsule by mouth 2 (two) times daily. (Patient taking differently: Take 500 mg by mouth See admin  instructions. Take 500 mg by mouth at 7 AM and 4:30 PM), Disp: 180 capsule, Rfl: 1   TYLENOL  8 HOUR ARTHRITIS PAIN 650 MG CR tablet, Take 1,300 mg by mouth at bedtime., Disp: , Rfl:    vitamin C  (ASCORBIC ACID ) 500 MG tablet, Take 1 tablet (500 mg total) by mouth daily. (Patient taking differently: Take 500 mg by mouth See admin instructions. Take 500 mg by mouth at 10:30 AM), Disp: 90 tablet, Rfl: 1   No Known Allergies    The patient states she uses post menopausal status for birth control. No LMP recorded. Patient is postmenopausal.. Negative for Dysmenorrhea. Negative for: breast discharge, breast lump(s), breast pain and breast self exam. Associated symptoms include abnormal vaginal bleeding. Pertinent negatives include abnormal bleeding (hematology), anxiety, decreased libido, depression, difficulty falling sleep, dyspareunia, history of infertility, nocturia, sexual dysfunction, sleep disturbances, urinary incontinence, urinary urgency, vaginal discharge and vaginal itching. Diet regular.The patient states her exercise level is  intermittent.  . The patient's tobacco use is:  Social History   Tobacco Use  Smoking Status Former   Current packs/day: 0.00   Average packs/day: 1 pack/day for 37.0 years (37.0 ttl pk-yrs)   Types: Cigarettes   Start date: 28   Quit date: 04/08/2005   Years since quitting: 18.6  Smokeless Tobacco Never  . She has been exposed to passive smoke. The patient's alcohol use is:  Social History   Substance and Sexual Activity  Alcohol Use No   Alcohol/week: 0.0 standard drinks of alcohol      Review of Systems  Constitutional: Negative.   HENT: Negative.    Eyes: Negative.  Negative for blurred vision.  Respiratory: Negative.    Cardiovascular: Negative.   Gastrointestinal: Negative.   Endocrine: Negative.   Genitourinary: Negative.   Musculoskeletal: Negative.   Skin: Negative.   Neurological: Negative.   Hematological: Negative.    Psychiatric/Behavioral: Negative.       Today's Vitals   11/29/23 0855  BP: 130/60  Pulse: 93  Temp: 98.2 F (36.8 C)  TempSrc: Oral  Weight: 149 lb (67.6 kg)  Height: 5' 2 (1.575 m)  PainSc: 0-No pain   Body mass index is 27.25 kg/m.  Wt Readings from Last 3 Encounters:  11/29/23 149 lb (67.6 kg)  10/17/23 149 lb 3.2 oz (67.7 kg)  09/19/23 151 lb 12.8 oz (68.9 kg)     Objective:  Physical Exam Vitals and nursing note reviewed.  Constitutional:      Appearance: Normal appearance.  HENT:     Head: Normocephalic and atraumatic.     Right Ear: Tympanic membrane, ear canal and external ear normal.     Left Ear: Tympanic membrane, ear canal and external ear normal.     Nose: Nose normal.     Mouth/Throat:     Mouth: Mucous membranes are moist.     Pharynx: Oropharynx is clear.  Eyes:     Conjunctiva/sclera: Conjunctivae normal.     Pupils: Pupils are equal, round, and reactive to light.     Comments: Abnormal EOM  Cardiovascular:     Rate and Rhythm: Normal rate and regular rhythm.     Pulses:          Dorsalis pedis pulses are 1+ on the right side and 1+ on the left side.     Heart sounds: Normal heart sounds.  Pulmonary:     Effort: Pulmonary effort is normal.     Breath sounds: Normal breath sounds.  Chest:  Breasts:    Tanner Score is 5.     Right: Normal.     Left: Normal.  Abdominal:     General: Abdomen is flat. Bowel sounds are normal.     Palpations: Abdomen is soft.  Genitourinary:    Comments: deferred Musculoskeletal:        General: Normal range of motion.     Cervical back: Normal range of motion and neck supple.  Feet:     Right foot:     Protective Sensation: 5 sites tested.  5 sites sensed.     Skin integrity: Dry skin present.     Toenail Condition: Right toenails are long.     Left foot:     Protective Sensation: 5 sites tested.  5 sites sensed.     Skin integrity: Dry skin present.     Toenail Condition: Left toenails are long.   Skin:    General: Skin is warm and dry.  Neurological:     General: No focal deficit present.     Mental Status: She is alert. Mental status is at baseline.     Coordination: Coordination abnormal.     Gait: Gait abnormal.     Comments: Delayed speech Abnormal gait  Psychiatric:        Mood and Affect: Mood normal.        Behavior: Behavior normal.     Assessment And Plan:     Encounter for annual physical exam Assessment & Plan: A full exam was performed.  Importance of monthly self breast exams was discussed with the patient.  She is advised to get 30-45 minutes of regular exercise, no less than four to five days per week. Both weight-bearing and aerobic exercises are recommended.  She is advised to follow a healthy diet with at least six fruits/veggies per day, decrease intake of red meat and other saturated fats and to increase fish intake to twice weekly.  Meats/fish should not be fried -- baked, boiled or broiled is preferable. It is also important to cut back on your sugar intake.  Be sure to read labels - try to avoid anything with added sugar, high fructose corn syrup or other sweeteners.  If you must use a sweetener, you can try stevia or monkfruit.  It is also important to avoid artificially sweetened foods/beverages and diet drinks. Lastly, wear SPF 50 sunscreen on exposed skin and when in direct sunlight for an extended period of time.  Be sure to avoid fast food  restaurants and aim for at least 60 ounces of water daily.       Benign hypertension with chronic kidney disease, stage II [I12.9, N18.2] Assessment & Plan: Chronic, stable. EKG performed, NSR w/o acute changes. She is encouraged to check her blood pressures at home. All questions were answered to her satisfaction. 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   - Continue telmisartan  and hydrochlorothiazide  daily. - Encourage  regular exercise.  Orders: -     EKG 12-Lead  Diabetes mellitus with stage 2 chronic kidney disease (HCC) [E11.22, N18.2] Assessment & Plan: Chronic, diabetic foot exam was performed.  Blood sugars range from 112 to 151 mg/dL. A1c at 8.4%. Target A1c below 8%, ideally below 7.5%. - Continue Ozempic  0.5 mg. - Schedule diabetes check in three months. - Check cholesterol and B12 levels. - Encourage regular exercise and dietary modifications.  Orders: -     CBC -     Lipid panel -     Hemoglobin A1c -     Microalbumin / creatinine urine ratio  Pure hypercholesterolemia [E78.00] Assessment & Plan: Chronic, currently on atorvastatin  10mg  daily.  LDL goal is less than 70. Importance of medication/dietary compliance was discussed with the patient.   Orders: -     Lipid panel  Estrogen deficiency -     DG Bone Density; Future  Hepatic steatosis Assessment & Plan: Diagnosed with hepatic steatosis. Emphasized exercise and sugar reduction. - Encourage regular exercise. - Advise reducing sugar intake.  Orders: -     FIB-4 W/REFLEX TO ELF  OSA on CPAP Assessment & Plan: Chronic, non-compliance with CPAP. Not using CPAP due to tubing issues. Discussed CPAP importance for brain health and neurological risks. - Discuss CPAP tubing issues with Dr. Lurene. - Consider CPAP use despite discomfort.   Drug therapy -     Vitamin B12   Mobility limitation Exercises independently 3-4 days a week. Encouraged to maintain independence through exercise. - Encourage continued exercise to maintain mobility and independence.  Return in 4 weeks (on 12/27/2023), or flu vaccine-NV, for 1 year physical, 3 month dm check. Patient was given opportunity to ask questions. Patient verbalized understanding of the plan and was able to repeat key elements of the plan. All questions were answered to their satisfaction.    I, [name], MD, have reviewed all documentation for this visit. The  documentation on 11/29/23 for the exam, diagnosis, procedures, and orders are all accurate and complete.

## 2023-11-29 NOTE — Patient Instructions (Signed)

## 2023-11-30 ENCOUNTER — Ambulatory Visit: Payer: Self-pay | Admitting: Internal Medicine

## 2023-11-30 LAB — LIPID PANEL
Chol/HDL Ratio: 2.8 ratio (ref 0.0–4.4)
Cholesterol, Total: 155 mg/dL (ref 100–199)
HDL: 55 mg/dL (ref >39)
LDL Chol Calc (NIH): 75 mg/dL (ref 0–99)
Triglycerides: 143 mg/dL (ref 0–149)
VLDL Cholesterol Cal: 25 mg/dL (ref 5–40)

## 2023-11-30 LAB — CBC
Hematocrit: 41 % (ref 34.0–46.6)
Hemoglobin: 12.9 g/dL (ref 11.1–15.9)
MCH: 28.4 pg (ref 26.6–33.0)
MCHC: 31.5 g/dL (ref 31.5–35.7)
MCV: 90 fL (ref 79–97)
Platelets: 372 x10E3/uL (ref 150–450)
RBC: 4.55 x10E6/uL (ref 3.77–5.28)
RDW: 13.1 % (ref 11.7–15.4)
WBC: 7.4 x10E3/uL (ref 3.4–10.8)

## 2023-11-30 LAB — HEMOGLOBIN A1C
Est. average glucose Bld gHb Est-mCnc: 180 mg/dL
Hgb A1c MFr Bld: 7.9 % — ABNORMAL HIGH (ref 4.8–5.6)

## 2023-11-30 LAB — MICROALBUMIN / CREATININE URINE RATIO
Creatinine, Urine: 133.6 mg/dL
Microalb/Creat Ratio: 9 mg/g{creat} (ref 0–29)
Microalbumin, Urine: 11.6 ug/mL

## 2023-11-30 LAB — VITAMIN B12: Vitamin B-12: 966 pg/mL (ref 232–1245)

## 2023-12-04 DIAGNOSIS — K760 Fatty (change of) liver, not elsewhere classified: Secondary | ICD-10-CM | POA: Insufficient documentation

## 2023-12-04 NOTE — Assessment & Plan Note (Signed)
 Diagnosed with hepatic steatosis. Emphasized exercise and sugar reduction. - Encourage regular exercise. - Advise reducing sugar intake.

## 2023-12-04 NOTE — Assessment & Plan Note (Signed)
 Chronic, stable. EKG performed, NSR w/o acute changes. She is encouraged to check her blood pressures at home. All questions were answered to her satisfaction. 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   - Continue telmisartan  and hydrochlorothiazide  daily. - Encourage regular exercise.

## 2023-12-04 NOTE — Assessment & Plan Note (Addendum)
 Chronic, non-compliance with CPAP. Not using CPAP due to tubing issues. Discussed CPAP importance for brain health and neurological risks. - Discuss CPAP tubing issues with Dr. Lurene. - Consider CPAP use despite discomfort.

## 2023-12-12 ENCOUNTER — Encounter: Payer: Self-pay | Admitting: Pharmacist

## 2023-12-12 NOTE — Progress Notes (Signed)
   12/12/2023  Patient ID: April Larson, female   DOB: 1950/12/29, 73 y.o.   MRN: [id number]  Pharmacy Quality Measure Review  This patient is appearing on a report for being at risk of failing the adherence measure for diabetes medications this calendar year.   Medication: Ozempic   Last fill date: 11/16/2023 for 28 day supply  Insurance report was not up to date. No action needed at this time.   Last impact date:  12/24/2023   Cassius DOROTHA Brought, PharmD, BCACP Clinical Pharmacist [phone]

## 2023-12-14 ENCOUNTER — Other Ambulatory Visit: Payer: Self-pay

## 2023-12-14 ENCOUNTER — Other Ambulatory Visit: Payer: Self-pay | Admitting: Internal Medicine

## 2023-12-14 ENCOUNTER — Other Ambulatory Visit (HOSPITAL_COMMUNITY): Payer: Self-pay

## 2023-12-14 MED FILL — Omeprazole Cap Delayed Release 40 MG: ORAL | 30 days supply | Qty: 30 | Fill #4 | Status: AC

## 2023-12-14 MED FILL — Atorvastatin Calcium Tab 10 MG (Base Equivalent): ORAL | 30 days supply | Qty: 30 | Fill #8 | Status: AC

## 2023-12-15 ENCOUNTER — Other Ambulatory Visit: Payer: Self-pay

## 2023-12-16 ENCOUNTER — Other Ambulatory Visit: Payer: Self-pay

## 2023-12-27 ENCOUNTER — Ambulatory Visit: Admitting: Internal Medicine

## 2023-12-27 ENCOUNTER — Ambulatory Visit (INDEPENDENT_AMBULATORY_CARE_PROVIDER_SITE_OTHER)

## 2023-12-27 VITALS — BP 130/80 | HR 85 | Temp 98.1°F | Ht 62.0 in | Wt 149.0 lb

## 2023-12-27 DIAGNOSIS — Z23 Encounter for immunization: Secondary | ICD-10-CM

## 2023-12-27 NOTE — Progress Notes (Signed)
 Patient is in office today for a nurse visit for Immunization. Patient Injection was given in the  Left deltoid. Patient tolerated injection well.

## 2023-12-28 ENCOUNTER — Other Ambulatory Visit: Payer: Self-pay | Admitting: Internal Medicine

## 2023-12-28 DIAGNOSIS — Z0000 Encounter for general adult medical examination without abnormal findings: Secondary | ICD-10-CM

## 2024-01-03 DIAGNOSIS — G231 Progressive supranuclear ophthalmoplegia [Steele-Richardson-Olszewski]: Secondary | ICD-10-CM | POA: Diagnosis not present

## 2024-01-03 DIAGNOSIS — Z961 Presence of intraocular lens: Secondary | ICD-10-CM | POA: Diagnosis not present

## 2024-01-03 DIAGNOSIS — H5000 Unspecified esotropia: Secondary | ICD-10-CM | POA: Diagnosis not present

## 2024-01-03 DIAGNOSIS — H35363 Drusen (degenerative) of macula, bilateral: Secondary | ICD-10-CM | POA: Diagnosis not present

## 2024-01-03 DIAGNOSIS — H04123 Dry eye syndrome of bilateral lacrimal glands: Secondary | ICD-10-CM | POA: Diagnosis not present

## 2024-01-03 DIAGNOSIS — E119 Type 2 diabetes mellitus without complications: Secondary | ICD-10-CM | POA: Diagnosis not present

## 2024-01-03 DIAGNOSIS — H1045 Other chronic allergic conjunctivitis: Secondary | ICD-10-CM | POA: Diagnosis not present

## 2024-01-06 ENCOUNTER — Other Ambulatory Visit: Payer: Self-pay | Admitting: Internal Medicine

## 2024-01-06 ENCOUNTER — Other Ambulatory Visit: Payer: Self-pay

## 2024-01-10 ENCOUNTER — Other Ambulatory Visit (HOSPITAL_COMMUNITY): Payer: Self-pay

## 2024-01-10 ENCOUNTER — Other Ambulatory Visit: Payer: Self-pay

## 2024-01-10 MED ORDER — ATORVASTATIN CALCIUM 10 MG PO TABS
10.0000 mg | ORAL_TABLET | Freq: Every day | ORAL | 3 refills | Status: AC
  Filled 2024-01-10: qty 90, 90d supply, fill #0
  Filled 2024-01-12: qty 30, 30d supply, fill #0
  Filled 2024-02-13: qty 30, 30d supply, fill #1
  Filled 2024-03-16: qty 30, 30d supply, fill #2
  Filled 2024-04-17: qty 30, 30d supply, fill #3

## 2024-01-12 ENCOUNTER — Other Ambulatory Visit: Payer: Self-pay

## 2024-01-12 MED FILL — Omeprazole Cap Delayed Release 40 MG: ORAL | 30 days supply | Qty: 30 | Fill #5 | Status: AC

## 2024-01-16 ENCOUNTER — Other Ambulatory Visit: Payer: Self-pay

## 2024-01-27 ENCOUNTER — Ambulatory Visit
Admission: RE | Admit: 2024-01-27 | Discharge: 2024-01-27 | Disposition: A | Discharge status: Home / Self Care | Source: Ambulatory Visit | Attending: Internal Medicine | Admitting: Internal Medicine

## 2024-01-27 DIAGNOSIS — Z1231 Encounter for screening mammogram for malignant neoplasm of breast: Secondary | ICD-10-CM | POA: Diagnosis not present

## 2024-01-27 DIAGNOSIS — Z0000 Encounter for general adult medical examination without abnormal findings: Secondary | ICD-10-CM

## 2024-01-31 ENCOUNTER — Ambulatory Visit: Attending: Internal Medicine | Admitting: Occupational Therapy

## 2024-01-31 ENCOUNTER — Ambulatory Visit: Admitting: Physical Therapy

## 2024-01-31 ENCOUNTER — Encounter: Payer: Self-pay | Admitting: Physical Therapy

## 2024-01-31 ENCOUNTER — Ambulatory Visit: Admitting: Speech Pathology

## 2024-01-31 DIAGNOSIS — R2681 Unsteadiness on feet: Secondary | ICD-10-CM

## 2024-01-31 DIAGNOSIS — R278 Other lack of coordination: Secondary | ICD-10-CM | POA: Insufficient documentation

## 2024-01-31 DIAGNOSIS — R41842 Visuospatial deficit: Secondary | ICD-10-CM | POA: Insufficient documentation

## 2024-01-31 DIAGNOSIS — R41841 Cognitive communication deficit: Secondary | ICD-10-CM | POA: Insufficient documentation

## 2024-01-31 DIAGNOSIS — M6281 Muscle weakness (generalized): Secondary | ICD-10-CM | POA: Insufficient documentation

## 2024-01-31 DIAGNOSIS — R41840 Attention and concentration deficit: Secondary | ICD-10-CM | POA: Insufficient documentation

## 2024-01-31 NOTE — Therapy (Unsigned)
 Occupational Therapy Parkinson's Disease Screen  Hand dominance:  Right    Physical Performance Test item #2 (simulated eating):  74 sec on top of B&B and 38 seconds from the table 20.1 sec   Physical Performance Test item #4 (donning/doffing jacket):  *** sec 16.12 sec  Fastening/unfastening 3 buttons in:  ***sec 42.82 sec  9-hole peg test:    RUE  placed 6 pegs in 180 sec        LUE  121 sec (board 5 from edge of table for eccentric viewing) RUE 38.94 sec LUE 37.44 sec   Box & Blocks Test:   RUE  17 blocks        LUE  21 blocks RUE: 22 blocks. LUE: 28 blocks    Change in ability to perform ADLs/IADLs:  increased dependence for ADLs and IADLs  Other Comments:  ***  Pt would benefit from occupational therapy evaluation due to  ***  Pt does not require occupational therapy services at this time.  Recommended occupational therapy screen in   ***

## 2024-01-31 NOTE — Therapy (Unsigned)
 [facility] [address] [place], [place], [address] Phone: [phone]   Fax:  [phone]  Patient Details  Name: April Larson MRN: [id number] Date of Birth: [birth year] Referring Provider:  Jarold Medici, MD  Encounter Date: 01/31/2024  Physical Therapy Parkinson's Disease Screen   10 meter walk test: 23.4 seconds   5 time sit to stand test: 49.9 seconds with BUE support with significantly decr anterior lean and retropulsion against chair (previously 1 minute and 10 seconds)  When asking pt to try to visually track while looking at therapist's finger, pt unable to track in any direction. Pt's eyes stay in a more superior alignment. When ambulating, pt unable to look down or in front of her and reports she can't really see.   Pt reports that she can't really see. Saw the optometrist recently and they said that nothing changed. Has been using her RW all the time. Has had some falls in the backwards direction, fell about 3 times in the past month. When she stands up and doesn't get her balance, will fall backwards. Every time she falls is in the backwards direction. Has not had a ramp installed, still doing the stairs and has assistance. Has not yet gotten a transport chair. Currently living with her brother at home. Pt reports her R hand is bothersome.   Pt's sister present during PD screen. Discussed that with pt's progression of PSP, esp with vision, then would be beneficial to see the neuro-ophthalmologist again (has not seen one in a few years). Discussed will pick pt up for an eval and see pt for 2 visits to continue to address safe sit <> stand technique, work on any safety measures due to significant visual deficits, and work on safety in the home and review education regarding this (this was discussed when pt was here last time for PT). Pt and pt's sister in agreement with plan.   Patient would benefit from Physical  Therapy evaluation due to having more falls and decline.    Sheffield LOISE Senate, PT, DPT 01/31/2024, 1:42 PM  [facility] [address] [place], [place], [address] Phone: [phone]   Fax:  [phone]

## 2024-02-01 ENCOUNTER — Telehealth: Payer: Self-pay | Admitting: Neurology

## 2024-02-01 DIAGNOSIS — G4733 Obstructive sleep apnea (adult) (pediatric): Secondary | ICD-10-CM

## 2024-02-01 NOTE — Telephone Encounter (Signed)
 Pt and care giver called to inform that  [employer] reached out to Pt stating that Pt is eligible   for Cpap However they will need a new order sent over  to them   did not have order at time of call . Pt also mention that she will need to  have machine without mask because she can't sleep with mask

## 2024-02-02 ENCOUNTER — Telehealth: Payer: Self-pay | Admitting: Physical Therapy

## 2024-02-02 DIAGNOSIS — G231 Progressive supranuclear ophthalmoplegia [Steele-Richardson-Olszewski]: Secondary | ICD-10-CM

## 2024-02-02 DIAGNOSIS — R2681 Unsteadiness on feet: Secondary | ICD-10-CM

## 2024-02-02 DIAGNOSIS — G20C Parkinsonism, unspecified: Secondary | ICD-10-CM

## 2024-02-02 DIAGNOSIS — R41842 Visuospatial deficit: Secondary | ICD-10-CM

## 2024-02-02 DIAGNOSIS — R278 Other lack of coordination: Secondary | ICD-10-CM

## 2024-02-02 NOTE — Telephone Encounter (Addendum)
 Sent community message to [employer] that order placed.

## 2024-02-02 NOTE — Telephone Encounter (Signed)
 Dr. Buck,  Your patient April Larson was seen for PD screens on 01/31/24. She would benefit from orders for PT and OT due to a decline in function and more frequent falls due to PSP.   Pt also reports worsening in her vision due to PSP and was unable to perform any visual tracking when I saw her. She reports she last saw a neuro-ophthalmologist when she was initially diagnosed, but has not been back in a few years. I feel like she would also need a new referral to see one again.   If you agree, please place an order in [facility] workque in [facility] or fax the order to [phone].  Thank you,  Sheffield Senate, PT, DPT 02/02/24 11:23 AM    [facility] [address] [place], [place]  [address] Phone:  [phone] Fax:  [phone]

## 2024-02-02 NOTE — Telephone Encounter (Signed)
 Referrals placed to neuro rehab and neuro ophth as recommended.

## 2024-02-02 NOTE — Telephone Encounter (Signed)
CPAP order placed

## 2024-02-13 ENCOUNTER — Other Ambulatory Visit: Payer: Self-pay

## 2024-02-13 MED FILL — Omeprazole Cap Delayed Release 40 MG: ORAL | 30 days supply | Qty: 30 | Fill #6 | Status: AC

## 2024-02-13 NOTE — Telephone Encounter (Signed)
 I placed a referral to neuro-ophthalmology and a referral to neurorehab on 02/02/2024.

## 2024-02-14 ENCOUNTER — Other Ambulatory Visit: Payer: Self-pay

## 2024-02-14 ENCOUNTER — Other Ambulatory Visit: Payer: Self-pay | Admitting: *Deleted

## 2024-02-14 ENCOUNTER — Telehealth: Payer: Self-pay | Admitting: Neurology

## 2024-02-14 ENCOUNTER — Encounter: Attending: Psychology | Admitting: Psychology

## 2024-02-14 DIAGNOSIS — G4733 Obstructive sleep apnea (adult) (pediatric): Secondary | ICD-10-CM | POA: Insufficient documentation

## 2024-02-14 DIAGNOSIS — R41842 Visuospatial deficit: Secondary | ICD-10-CM

## 2024-02-14 DIAGNOSIS — G20C Parkinsonism, unspecified: Secondary | ICD-10-CM

## 2024-02-14 DIAGNOSIS — R413 Other amnesia: Secondary | ICD-10-CM | POA: Diagnosis present

## 2024-02-14 DIAGNOSIS — G231 Progressive supranuclear ophthalmoplegia [Steele-Richardson-Olszewski]: Secondary | ICD-10-CM

## 2024-02-14 DIAGNOSIS — R269 Unspecified abnormalities of gait and mobility: Secondary | ICD-10-CM | POA: Insufficient documentation

## 2024-02-14 DIAGNOSIS — F4323 Adjustment disorder with mixed anxiety and depressed mood: Secondary | ICD-10-CM | POA: Diagnosis not present

## 2024-02-14 DIAGNOSIS — R2681 Unsteadiness on feet: Secondary | ICD-10-CM

## 2024-02-14 DIAGNOSIS — R278 Other lack of coordination: Secondary | ICD-10-CM

## 2024-02-14 NOTE — Addendum Note (Signed)
 Addended by: HILLIARD HEATHER CROME on: 02/14/2024 09:48 AM   Modules accepted: Orders

## 2024-02-14 NOTE — Telephone Encounter (Signed)
 Referral for ophthalmology fax to [facility]. Phone: [phone], Fax: [phone]

## 2024-02-14 NOTE — Therapy (Incomplete)
 OUTPATIENT OCCUPATIONAL THERAPY PARKINSON'S EVALUATION  Patient Name: April Larson MRN: [id number] DOB:Apr 24, 1950, 73 y.o., female Today's Date: 02/14/2024  PCP: *** REFERRING PROVIDER: Buck Saucer, MD   END OF SESSION:   Past Medical History:  Diagnosis Date   Diabetes mellitus without complication (HCC)    Hypertension    Parkinson disease (HCC)    Substance abuse (HCC)    Past Surgical History:  Procedure Laterality Date   EYE SURGERY     Patient Active Problem List   Diagnosis Date Noted   Hepatic steatosis 12/04/2023   OSA on CPAP 05/17/2023   Anemia 04/03/2023   Supranuclear palsy (HCC) 04/03/2023   Atypical syncope 03/04/2023   Seizure disorder (HCC) 03/04/2023   Seizure-like activity (HCC) 03/02/2023   Thrombocytosis 12/06/2022   Gait abnormality 12/06/2022   Encounter for annual physical exam 11/24/2022   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   Decreased dorsalis pedis pulse 11/17/2020   Tachycardia 08/11/2020   Cough 08/11/2020   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   Pure hypercholesterolemia 01/23/2020   Vitamin D  deficiency 10/23/2019   Right hip pain 05/15/2018   Estrogen deficiency 05/15/2018   Class 1 obesity due to excess calories with serious comorbidity and body mass index (BMI) of 31.0 to 31.9 in adult 05/15/2018   Diabetes mellitus with stage 2 chronic kidney disease (HCC) 01/11/2018   Benign hypertension with chronic kidney disease, stage II 01/11/2018    ONSET DATE: 02/08/24 (referral date)   REFERRING DIAG:  R26.81 (ICD-10-CM) - Unsteadiness on feet  R41.842 (ICD-10-CM) - Visuospatial deficit  G23.1 (ICD-10-CM) - 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  Rationale for Evaluation and Treatment: Rehabilitation  SUBJECTIVE:   SUBJECTIVE  STATEMENT: *** Pt accompanied by: {accompnied:27141}  PERTINENT HISTORY: ***  PRECAUTIONS: {Therapy precautions:24002}  WEIGHT BEARING RESTRICTIONS: {Yes ***/No:24003}  PAIN:  Are you having pain? {OPRCPAIN:27236}  FALLS: Has patient fallen in last 6 months? {fallsyesno:27318}  LIVING ENVIRONMENT: Lives with: {OPRC lives with:25569::lives with their family} Lives in: {Lives in:25570} Stairs: {opstairs:27293} Has following equipment at home: {Assistive devices:23999}  PLOF: {PLOF:24004}  PATIENT GOALS: ***  OBJECTIVE:  Note: Objective measures were completed at Evaluation unless otherwise noted.  HAND DOMINANCE: {MISC; OT HAND [phone]}  ADLs: Overall ADLs: *** Transfers/ambulation related to ADLs: Eating: *** Grooming: *** UB Dressing: *** LB Dressing: *** Toileting: *** Bathing: *** Tub Shower transfers: *** Equipment: {equipment:25573}  IADLs: Shopping: *** Light housekeeping: *** Meal Prep: *** Community mobility: *** Medication management: *** Financial management: *** Handwriting: {OTWRITTENEXPRESSION:25361}  MOBILITY STATUS: {OTMOBILITY:25360}  POSTURE COMMENTS:  {posture:25561}  ACTIVITY TOLERANCE: Activity tolerance: ***  FUNCTIONAL OUTCOME MEASURES: {PDoutcomemeasures:27287}  COORDINATION: {otcoordination:27237}  UE ROM:  {PDMEASUREMENT:27288}  UE MMT:   {PDMEASUREMENT:27288}  SENSATION: {sensation:27233}  MUSCLE TONE: {UETONE:25567}  COGNITION: Overall cognitive status: {cognition:24006}  OBSERVATIONS: {PDobservations:27291}  TREATMENT DATE: ***    PATIENT EDUCATION: Education details: *** Person educated: {Person educated:25204} Education method: {Education Method:25205} Education comprehension: {Education Comprehension:25206}  HOME EXERCISE PROGRAM: ***  GOALS: Goals reviewed with  patient? {yes/no:20286}  SHORT TERM GOALS: Target date: ***  *** Baseline: Goal status: INITIAL  2.  *** Baseline:  Goal status: INITIAL  3.  *** Baseline:  Goal status: INITIAL  4.  *** Baseline:  Goal status: INITIAL  5.  *** Baseline:  Goal status: INITIAL  6.  *** Baseline:  Goal status: INITIAL  LONG TERM GOALS: Target date: ***  *** Baseline:  Goal status: INITIAL  2.  *** Baseline:  Goal status: INITIAL  3.  *** Baseline:  Goal status: INITIAL  4.  *** Baseline:  Goal status: INITIAL  5.  *** Baseline:  Goal status: INITIAL  6.  *** Baseline:  Goal status: INITIAL ASSESSMENT:  CLINICAL IMPRESSION: Patient is a *** y.o. *** who was seen today for occupational therapy evaluation for ***.   PERFORMANCE DEFICITS: in functional skills including {OT physical skills:25468}, cognitive skills including {OT cognitive skills:25469}, and psychosocial skills including {OT psychosocial skills:25470}.   IMPAIRMENTS: are limiting patient from {OT performance deficits:25471}.   COMORBIDITIES:  {Comorbidities:25485} that affects occupational performance. Patient will benefit from skilled OT to address above impairments and improve overall function.  MODIFICATION OR ASSISTANCE TO COMPLETE EVALUATION: {OT modification:25474}  OT OCCUPATIONAL PROFILE AND HISTORY: {OT PROFILE AND HISTORY:25484}  CLINICAL DECISION MAKING: {OT CDM:25475}  REHAB POTENTIAL: {rehabpotential:25112}  EVALUATION COMPLEXITY: {Evaluation complexity:25115}    PLAN:  OT FREQUENCY: {rehab frequency:25116}  OT DURATION: {rehab duration:25117}  PLANNED INTERVENTIONS: {OT Interventions:25467}  RECOMMENDED OTHER SERVICES: ***  CONSULTED AND AGREED WITH PLAN OF CARE: {ENR:74513}  PLAN FOR NEXT SESSION: ***   Burnard JINNY Roads, OT 02/14/2024, 12:37 PM

## 2024-02-15 ENCOUNTER — Other Ambulatory Visit: Payer: Self-pay

## 2024-02-18 ENCOUNTER — Other Ambulatory Visit: Payer: Self-pay

## 2024-02-20 ENCOUNTER — Ambulatory Visit: Admitting: Occupational Therapy

## 2024-02-20 ENCOUNTER — Ambulatory Visit: Attending: Internal Medicine | Admitting: Physical Therapy

## 2024-02-20 DIAGNOSIS — R2681 Unsteadiness on feet: Secondary | ICD-10-CM | POA: Insufficient documentation

## 2024-02-20 DIAGNOSIS — R278 Other lack of coordination: Secondary | ICD-10-CM | POA: Insufficient documentation

## 2024-02-20 DIAGNOSIS — M6281 Muscle weakness (generalized): Secondary | ICD-10-CM | POA: Insufficient documentation

## 2024-02-20 DIAGNOSIS — R296 Repeated falls: Secondary | ICD-10-CM | POA: Insufficient documentation

## 2024-02-20 NOTE — Therapy (Incomplete)
 OUTPATIENT PHYSICAL THERAPY NEURO EVALUATION   Patient Name: April Larson MRN: [id number] DOB:[birth year], 73 y.o., female Today's Date: 02/20/2024   PCP: Jarold Medici, MD  REFERRING PROVIDER: Buck Saucer, MD   END OF SESSION:   Past Medical History:  Diagnosis Date   Diabetes mellitus without complication (HCC)    Hypertension    Parkinson disease (HCC)    Substance abuse (HCC)    Past Surgical History:  Procedure Laterality Date   EYE SURGERY     Patient Active Problem List   Diagnosis Date Noted   Hepatic steatosis 12/04/2023   OSA on CPAP 05/17/2023   Anemia 04/03/2023   Supranuclear palsy (HCC) 04/03/2023   Atypical syncope 03/04/2023   Seizure disorder (HCC) 03/04/2023   Seizure-like activity (HCC) 03/02/2023   Thrombocytosis 12/06/2022   Gait abnormality 12/06/2022   Encounter for annual physical exam 11/24/2022   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   Decreased dorsalis pedis pulse 11/17/2020   Tachycardia 08/11/2020   Cough 08/11/2020   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   Pure hypercholesterolemia 01/23/2020   Vitamin D  deficiency 10/23/2019   Right hip pain 05/15/2018   Estrogen deficiency 05/15/2018   Class 1 obesity due to excess calories with serious comorbidity and body mass index (BMI) of 31.0 to 31.9 in adult 05/15/2018   Diabetes mellitus with stage 2 chronic kidney disease (HCC) 01/11/2018   Benign hypertension with chronic kidney disease, stage II 01/11/2018    ONSET DATE: 02/02/2024 (referral)   REFERRING DIAG: R26.81 (ICD-10-CM) - Unsteadiness on feet R27.8 (ICD-10-CM) - Other lack of coordination R41.842 (ICD-10-CM) - Visuospatial deficit G20.C (ICD-10-CM) - Atypical parkinsonism (HCC) G23.1 (ICD-10-CM) - Supranuclear palsy (HCC)  THERAPY DIAG:  No diagnosis found.  Rationale for Evaluation and Treatment: Rehabilitation  SUBJECTIVE:                                                                                                                                                                                              SUBJECTIVE STATEMENT: *** Pt accompanied by: {accompnied:27141}  PERTINENT HISTORY: HTN, DM, high chol, CKD, Parkinsons (PSP)  PAIN:  Are you having pain? {OPRCPAIN:27236}  PRECAUTIONS: {Therapy precautions:24002}  RED FLAGS: {PT Red Flags:29287}   WEIGHT BEARING RESTRICTIONS: {Yes ***/No:24003}  FALLS: Has patient fallen in last 6 months? {fallsyesno:27318}  LIVING ENVIRONMENT: Lives with: {OPRC lives with:25569::lives with their family} Lives in: {Lives in:25570} Stairs: {opstairs:27293} Has following equipment at home: {Assistive devices:23999}  PLOF: {PLOF:24004}  PATIENT GOALS: ***  OBJECTIVE:  Note: Objective measures were completed at Evaluation unless otherwise noted.  DIAGNOSTIC FINDINGS: MRI of brain from 03/03/2023 IMPRESSION: 1. No  acute intracranial abnormality or significant interval change. 2. Stable mild atrophy and scattered white matter disease. This likely reflects the sequela of chronic microvascular ischemia.  COGNITION: Overall cognitive status: {cognition:24006}   SENSATION: {sensation:27233}  COORDINATION: ***  EDEMA:  {edema:24020}  MUSCLE TONE: {LE tone:25568}  MUSCLE LENGTH: Hamstrings: Right *** deg; Left *** deg Debby test: Right *** deg; Left *** deg  DTRs:  {DTR SITE:24025}  POSTURE: {posture:25561}  LOWER EXTREMITY ROM:     {AROM/PROM:27142}  Right Eval Left Eval  Hip flexion    Hip extension    Hip abduction    Hip adduction    Hip internal rotation    Hip external rotation    Knee flexion    Knee extension    Ankle dorsiflexion    Ankle plantarflexion    Ankle inversion    Ankle eversion     (Blank rows = not tested)  LOWER EXTREMITY MMT:    MMT Right Eval Left Eval  Hip flexion    Hip extension    Hip abduction    Hip adduction    Hip internal rotation    Hip external rotation     Knee flexion    Knee extension    Ankle dorsiflexion    Ankle plantarflexion    Ankle inversion    Ankle eversion    (Blank rows = not tested)  BED MOBILITY:  {bed mobility:32615:p}  TRANSFERS: {transfers eval:32620}  RAMP:  {ramp eval:32616}  CURB:  {curb eval:32617}  STAIRS: {stairs eval:32618} GAIT: Findings: {GaitneuroPT:32644::Distance walked: ***,Comments: ***}  FUNCTIONAL TESTS:  {Functional tests:24029}  PATIENT SURVEYS:  {rehab surveys:24030}                                                                                                                              TREATMENT DATE: ***    PATIENT EDUCATION: Education details: *** Person educated: {Person educated:25204} Education method: {Education Method:25205} Education comprehension: {Education Comprehension:25206}  HOME EXERCISE PROGRAM: ***  GOALS: Goals reviewed with patient? {yes/no:20286}  SHORT TERM GOALS: Target date: ***  *** Baseline: Goal status: INITIAL  2.  *** Baseline:  Goal status: INITIAL  3.  *** Baseline:  Goal status: INITIAL  4.  *** Baseline:  Goal status: INITIAL  5.  *** Baseline:  Goal status: INITIAL  6.  *** Baseline:  Goal status: INITIAL  LONG TERM GOALS: Target date: ***  *** Baseline:  Goal status: INITIAL  2.  *** Baseline:  Goal status: INITIAL  3.  *** Baseline:  Goal status: INITIAL  4.  *** Baseline:  Goal status: INITIAL  5.  *** Baseline:  Goal status: INITIAL  6.  *** Baseline:  Goal status: INITIAL  ASSESSMENT:  CLINICAL IMPRESSION: Patient is a 73 year old female referred to Neuro OPPT for supranuclear palsy. Pt's PMH is significant for: HTN, DM, high chol, CKD, Parkinsons. The following deficits were present during the exam: ***. Based on ***, pt is  an incr risk for falls. Pt would benefit from skilled PT to address these impairments and functional limitations to maximize functional mobility  independence   OBJECTIVE IMPAIRMENTS: {opptimpairments:25111}.   ACTIVITY LIMITATIONS: {activitylimitations:27494}  PARTICIPATION LIMITATIONS: {participationrestrictions:25113}  PERSONAL FACTORS: {Personal factors:25162} are also affecting patient's functional outcome.   REHAB POTENTIAL: {rehabpotential:25112}  CLINICAL DECISION MAKING: {clinical decision making:25114}  EVALUATION COMPLEXITY: {Evaluation complexity:25115}  PLAN:  PT FREQUENCY: {rehab frequency:25116}  PT DURATION: {rehab duration:25117}  PLANNED INTERVENTIONS: {rehab planned interventions:25118::97110-Therapeutic exercises,97530- Therapeutic [phone]- Neuromuscular re-education,97535- Self Rjmz,02859- Manual therapy,Patient/Family education}  PLAN FOR NEXT SESSION: ***   Lavarius Doughten E Raelle Chambers, PT, DPT 02/20/2024, 7:39 AM

## 2024-02-21 ENCOUNTER — Other Ambulatory Visit: Payer: Self-pay | Admitting: Internal Medicine

## 2024-02-21 NOTE — Progress Notes (Signed)
 Neuropsychology Visit  Patient:  April Larson   DOB: 11/02/1950  MR Number: [id number]  Location: [facility] [place] [facility] [address] [place], [address] [place] [address] Dept: [phone]  Date of Service: 02/14/2024  Start: 11 AM End: 12 PM  Today's visit was conducted in my outpatient clinic office with the patient myself present.  Duration of Service: 1 Hour  Provider/Observer:     Norleen JONELLE Asa PsyD  Chief Complaint:      Chief Complaint  Patient presents with   Memory Loss   Loss of Vision   Depression   Fall   Gait Problem    Reason For Service:     April Larson is a 73 year old female referred for neuropsychological consultation and psychotherapeutic interventions for adjustment disorder with depression and anxiety symptoms, and memory problems that have worsened over the past two years. Symptoms began approximately three years ago with a near-syncope event. Subsequently developed motor changes and was referred to neurology. 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 BP medications were stopped, resolving orthostasis symptoms. Has also experienced rigidity, instability, atypical eye issues, and falls in 2023. Current medications include Exelon  1.5 mg BID since September 2022, which sister feels is helping. Diagnosed with obstructive sleep apnea and prescribed a CPAP.  Reports worsening vision but regular ophthalmologist noted no change in vision over the last year. Ophthalmologist suggested follow-up with a neurologist or neuro-ophthalmologist. An appointment is scheduled with a neurology nurse practitioner Teddi Born) in Dr. Obie office in February. Reports not using her CPAP machine  for the past two weeks after believing she saw sparks or fire coming from the wiring while she was using it.  Treatment Interventions:  Cognitive/behavioral psychotherapeutic interventions and psychoeducation and skills building.  Participation Level:   Active  Participation Quality:  Appropriate      Behavioral Observation:  Well Groomed, Alert, and Appropriate.   Current Psychosocial Factors:  Currently lives with her brother and sister, who have been providing assistance for the past 1.5 years. Her sister reports significant variability in memory and cognitive functions, noting a strong correlation between good cognitive/memory days and good motor functioning days.  Content of Session:   The session focused on psychoeducation regarding obstructive sleep apnea (OSA) and the critical importance of consistent CPAP use. The patient's reported visual experience of fire coming from the CPAP machine was explored. It was explained that this was likely a hypnagogic/hypnopompic hallucination (an ultra-vivid dream while partially awake), a phenomenon that can be exacerbated by untreated OSA. The physical mechanics of the CPAP machine were discussed to reassure the patient of its safety, explaining that the low-gauge wiring would not sustain a fire and that the plastic/rubber tubing are insulators. Extensive psychoeducation was provided on the nature of OSA, how CPAP works to provide constant positive airway pressure, and why it can initially feel uncomfortable. The physiological consequences of untreated OSA were heavily emphasized, including accelerated aging, increased risk for stroke, heart attack, and cancer, and worsening of her current neurological symptoms like memory impairment and visual disturbances. The process of habituation to the CPAP was compared to getting used to wearing a seatbelt. The patient was strongly encouraged to resume using the CPAP machine nightly, aiming for a minimum of four  hours, and to persist for at least a month  to allow her brain to adapt. The session addressed her rationale for non-compliance and aimed to reframe her perception of the device from a nuisance to an essential medical treatment. The potential for a BiPAP or Inspire device was discussed, clarifying that strict CPAP compliance is a prerequisite for insurance coverage for these alternatives.   Effectiveness of Interventions: Engaged in the session and appeared to understand the information provided regarding OSA and CPAP. She agreed to try using the CPAP machine again, starting tonight. The psychoeducation appeared to be effective in addressing her specific fears and misconceptions.  Target Goals:   Working on better understanding of her status and ongoing medical issues and developing more effective coping strategies and utilization and adherence to medical interventions.  Goals Last Reviewed:   02/14/2024  Goals Addressed Today:    The session addressed adherence to medical recommendations, specifically CPAP use for obstructive sleep apnea. Addressed the patient's catastrophizing regarding the safety of the CPAP machine. Provided extensive psychoeducation to improve understanding and coping skills related to her neurological and sleep-related conditions.  Impression/Diagnosis:    April Larson is a 73 year old female referred for neuropsychological consultation and consideration of psychotherapeutic interventions for adjustment disorder with depression and anxiety type symptoms, memory problems that have worsened over the past 2 years.  While objective neuropsychological assessment of memory functions will likely be conducted, the initial focus will be on adjustment and coping issues with her progressively worsening condition and neurological status.   Diagnosis:   Adjustment disorder with mixed anxiety and depressed mood  Memory loss  OSA on CPAP  Gait abnormality    Norleen Asa, Psy.D. Clinical Psychologist Neuropsychologist

## 2024-02-27 ENCOUNTER — Ambulatory Visit: Admitting: Physical Therapy

## 2024-02-27 ENCOUNTER — Encounter: Payer: Self-pay | Admitting: Physical Therapy

## 2024-02-27 DIAGNOSIS — R2681 Unsteadiness on feet: Secondary | ICD-10-CM | POA: Diagnosis present

## 2024-02-27 DIAGNOSIS — M6281 Muscle weakness (generalized): Secondary | ICD-10-CM

## 2024-02-27 DIAGNOSIS — R278 Other lack of coordination: Secondary | ICD-10-CM | POA: Diagnosis present

## 2024-02-27 DIAGNOSIS — R296 Repeated falls: Secondary | ICD-10-CM

## 2024-02-27 NOTE — Therapy (Signed)
 OUTPATIENT PHYSICAL THERAPY NEURO EVALUATION   Patient Name: April Larson MRN: [id number] DOB:1950-10-27, 73 y.o., female Today's Date: 02/27/2024   PCP: Jarold Medici, MD  REFERRING PROVIDER: Buck Saucer, MD   END OF SESSION:  PT End of Session - 02/27/24 1022     Visit Number 1    Number of Visits 2    Date for Recertification  03/26/24    Authorization Type [employer] Dual Complete    PT Start Time 1018    PT Stop Time 1051    PT Time Calculation (min) 33 min    Activity Tolerance Patient tolerated treatment well    Behavior During Therapy WFL for tasks assessed/performed          Past Medical History:  Diagnosis Date   Diabetes mellitus without complication (HCC)    Hypertension    Parkinson disease (HCC)    Substance abuse (HCC)    Past Surgical History:  Procedure Laterality Date   EYE SURGERY     Patient Active Problem List   Diagnosis Date Noted   Hepatic steatosis 12/04/2023   OSA on CPAP 05/17/2023   Anemia 04/03/2023   Supranuclear palsy (HCC) 04/03/2023   Atypical syncope 03/04/2023   Seizure disorder (HCC) 03/04/2023   Seizure-like activity (HCC) 03/02/2023   Thrombocytosis 12/06/2022   Gait abnormality 12/06/2022   Encounter for annual physical exam 11/24/2022   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   Decreased dorsalis pedis pulse 11/17/2020   Tachycardia 08/11/2020   Cough 08/11/2020   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   Pure hypercholesterolemia 01/23/2020   Vitamin D  deficiency 10/23/2019   Right hip pain 05/15/2018   Estrogen deficiency 05/15/2018   Class 1 obesity due to excess calories with serious comorbidity and body mass index (BMI) of 31.0 to 31.9 in adult 05/15/2018   Diabetes mellitus with stage 2 chronic kidney disease (HCC) 01/11/2018   Benign hypertension with chronic kidney disease, stage II 01/11/2018    ONSET DATE: 02/02/2024 (referral)   REFERRING DIAG: R26.81 (ICD-10-CM) - Unsteadiness on feet R27.8  (ICD-10-CM) - Other lack of coordination R41.842 (ICD-10-CM) - Visuospatial deficit G20.C (ICD-10-CM) - Atypical parkinsonism (HCC) G23.1 (ICD-10-CM) - Supranuclear palsy (HCC)  THERAPY DIAG:  Repeated falls  Unsteadiness on feet  Other lack of coordination  Muscle weakness (generalized)  Rationale for Evaluation and Treatment: Rehabilitation  SUBJECTIVE:                                                                                                                                                                                             SUBJECTIVE STATEMENT: Pt presents w/RW and sister, [name].  Pt unable to stand from clinic lobby chair due to severe retropulsion, requiring mod A from therapist to stand. Pt ambulated slowly w/RW and min A from therapist to navigate due to vision impairments. Min cues provided to keep RW on ground as pt frequently lifting RW.   Per sister, pt is currently living with her brother and has had multiple falls in her house due to falling backwards. Most of these have occurred in the bathroom. Pt requires total A to get off floor when this happens. Sister reports pt is very stubborn and often does what she wants as she is used to living alone and has not adjusted to needing assistance well.   Pt does not have a transport chair or a ramp at home. Sister reports there are some things her brother needs to have settled prior to getting a ramp. Sister also states she is going to adjust her schedule so she can be home with the pt more, but pt is never left alone.    Pt accompanied by: Darin Hoover   PERTINENT HISTORY: HTN, DM, high chol, CKD, Parkinsons (PSP)  PAIN:  Are you having pain? No  PRECAUTIONS: Fall  RED FLAGS: None   WEIGHT BEARING RESTRICTIONS: No  FALLS: Has patient fallen in last 6 months? Yes. Number of falls 4-5  LIVING ENVIRONMENT: Lives with: Brother  Lives in: House/apartment Stairs: Yes: External: 3 steps; bilateral but cannot  reach both Has following equipment at home: Walker - 2 wheeled  PLOF: Needs assistance with ADLs, Needs assistance with homemaking, Needs assistance with gait, and Needs assistance with transfers  PATIENT GOALS: to keep moving   OBJECTIVE:  Note: Objective measures were completed at Evaluation unless otherwise noted.  DIAGNOSTIC FINDINGS: MRI of brain from 03/03/2023 IMPRESSION: 1. No acute intracranial abnormality or significant interval change. 2. Stable mild atrophy and scattered white matter disease. This likely reflects the sequela of chronic microvascular ischemia.  COGNITION: Overall cognitive status: Impaired   SENSATION: Not tested   POSTURE: rounded shoulders, forward head, and upward gaze  LOWER EXTREMITY ROM:     Active  Right Eval Left Eval  Hip flexion    Hip extension    Hip abduction    Hip adduction    Hip internal rotation    Hip external rotation    Knee flexion    Knee extension    Ankle dorsiflexion    Ankle plantarflexion    Ankle inversion    Ankle eversion     (Blank rows = not tested)  LOWER EXTREMITY MMT:    MMT Right Eval Left Eval  Hip flexion    Hip extension    Hip abduction    Hip adduction    Hip internal rotation    Hip external rotation    Knee flexion    Knee extension    Ankle dorsiflexion    Ankle plantarflexion    Ankle inversion    Ankle eversion    (Blank rows = not tested)  BED MOBILITY:  Not tested Pt requires assistance for bed mobility   TRANSFERS: Sit to stand: Mod A  Assistive device utilized: Environmental Consultant - 2 wheeled     Stand to sit: Min A  Assistive device utilized: Environmental Consultant - 2 wheeled     Severe retropulsion   RAMP:  Not tested  CURB:  Not tested  STAIRS: Not tested GAIT: Gait pattern: step through pattern, decreased stride length, lateral hip instability, and lateral lean- Left Distance walked: Various  clinic distances  Assistive device utilized: Environmental Consultant - 2 wheeled Level of assistance:  Min A Comments: Due to impaired vision, pt frequently drifting to L side or running into obstacles on L side, requiring min A to safely navigate clinic. Min cues to keep RW on ground as she occasionally will carry it.                                                                                                                               TREATMENT:   Self-care/home management  Discussed need for a care aide to assist at home and recommended pt look into her insurance benefits to determine what she qualifies for.  Encouraged purchase of a transport chair for safety in community and convenience w/traveling to appointments. Printed off a link from [employer] (see pt instructions) of a chair and sister reports they will purchase today and bring to next session to perform caregiver training on how to use it.  Educated pt on progressiveness of PSP and importance of reducing her falls at home. Pt states she did not know it was progressive and is having a hard time adjusting to needing 24/7 supervision when she is used to living alone. Provided therapeutic listening as appropriate and recommended pt look into grief counseling to assist w/this change. Pt and sister verbalized understanding.  Discussed looking into ALF/SNF placements as these often have a waitlist. Sister reports they have discussed potential to place pt at a SNF, but pt is not ready for this.  Enocuraged pt to continue doing her HEP at home but be mindful of her fall risk and ensure that she has someone with her when performing transfers. Pt states she does what she wants most of the time but will work on this.    PATIENT EDUCATION: Education details: See self-care above, plan to perform transport chair education next session  Person educated: Patient and Sister Education method: Explanation, Demonstration, and Handouts Education comprehension: verbalized understanding and needs further education  HOME EXERCISE PROGRAM: From previous  POC:  Access Code: [email or web address] URL: [email or web address] Date: 07/04/2023 Prepared by: Sheffield Senate   Exercises - Sit to Stand with Armchair  - 2 x daily - 7 x weekly - 1 sets - 10 reps  - Seated knee extension   - 1 x daily - 7 x weekly - 3 sets - 10 reps - 2-3 seconds hold   - Rolling both ways in bed a few times a day with caregiver supervision initially  - Bridge scoot in bed practice when going to bed 1x a day  GOALS: Goals reviewed with patient? Yes  STG = LTG DUE TO POC LENGTH    LONG TERM GOALS: Target date: 03/26/2024   Pt will obtain transport chair for home and community usage and caregiver will be independent w/managing transport chair for improved pt safety.  Baseline: sister to order chair on 11/24 Goal status: INITIAL   ASSESSMENT:  CLINICAL IMPRESSION:  Patient is a 73 year old female referred to Neuro OPPT for supranuclear palsy. Pt's PMH is significant for: HTN, DM, high chol, CKD, Parkinsons. The following deficits were present during the exam: impaired vision, decreased safety awareness, improper body mechanics and impaired balance. Based on falls history and PSP, pt is an incr risk for falls. Pt would benefit from skilled PT for caregiver education on safety at home and proper use of DME to address these impairments to maximize functional mobility, independence and reduce fall frequency.    OBJECTIVE IMPAIRMENTS: Abnormal gait, decreased activity tolerance, decreased balance, decreased cognition, decreased coordination, decreased knowledge of condition, decreased knowledge of use of DME, decreased mobility, difficulty walking, decreased safety awareness, impaired perceived functional ability, impaired vision/preception, and improper body mechanics  ACTIVITY LIMITATIONS: carrying, lifting, bending, standing, squatting, stairs, transfers, bed mobility, bathing, toileting, dressing, reach over head, hygiene/grooming, locomotion level, and caring for  others  PARTICIPATION LIMITATIONS: meal prep, cleaning, laundry, medication management, personal finances, interpersonal relationship, driving, shopping, community activity, yard work, and church  PERSONAL FACTORS: Fitness, Past/current experiences, and 1 comorbidity: PSP are also affecting patient's functional outcome.   REHAB POTENTIAL: Fair due to PSP  CLINICAL DECISION MAKING: Evolving/moderate complexity  EVALUATION COMPLEXITY: Moderate  PLAN:  PT FREQUENCY: 1x/week  PT DURATION: 4 weeks (only one visit after evaluation)   PLANNED INTERVENTIONS: 02835- PT Re-evaluation, 97110-Therapeutic exercises, 97530- Therapeutic activity, 97112- Neuromuscular re-education, 97535- Self Care, 02859- Manual therapy, Patient/Family education, DME instructions, and Wheelchair mobility training  PLAN FOR NEXT SESSION: Educate sister on proper use of transport chair, review HEP if needed. Did they look into care aide? Grief counseling?    Lora Glomski E Tyniah Kastens, PT, DPT 02/27/2024, 11:02 AM

## 2024-02-27 NOTE — Patient Instructions (Signed)
 [id number]=1

## 2024-02-28 ENCOUNTER — Other Ambulatory Visit: Payer: Self-pay | Admitting: Internal Medicine

## 2024-02-28 ENCOUNTER — Other Ambulatory Visit (HOSPITAL_COMMUNITY): Payer: Self-pay

## 2024-02-28 ENCOUNTER — Other Ambulatory Visit: Payer: Self-pay

## 2024-02-28 MED ORDER — TELMISARTAN-HCTZ 40-12.5 MG PO TABS
1.0000 | ORAL_TABLET | Freq: Every morning | ORAL | 1 refills | Status: AC
  Filled 2024-02-28 – 2024-03-16 (×3): qty 90, 90d supply, fill #0

## 2024-02-29 ENCOUNTER — Other Ambulatory Visit: Payer: Self-pay

## 2024-02-29 ENCOUNTER — Other Ambulatory Visit (HOSPITAL_COMMUNITY): Payer: Self-pay

## 2024-03-05 ENCOUNTER — Other Ambulatory Visit: Payer: Self-pay | Admitting: Internal Medicine

## 2024-03-05 ENCOUNTER — Ambulatory Visit: Admitting: Physical Therapy

## 2024-03-05 ENCOUNTER — Encounter: Payer: Self-pay | Admitting: Internal Medicine

## 2024-03-05 ENCOUNTER — Ambulatory Visit: Payer: Self-pay | Admitting: Internal Medicine

## 2024-03-05 VITALS — BP 126/80 | HR 94 | Temp 98.3°F | Ht 62.0 in | Wt 150.4 lb

## 2024-03-05 DIAGNOSIS — E1122 Type 2 diabetes mellitus with diabetic chronic kidney disease: Secondary | ICD-10-CM

## 2024-03-05 DIAGNOSIS — G4733 Obstructive sleep apnea (adult) (pediatric): Secondary | ICD-10-CM

## 2024-03-05 DIAGNOSIS — I129 Hypertensive chronic kidney disease with stage 1 through stage 4 chronic kidney disease, or unspecified chronic kidney disease: Secondary | ICD-10-CM

## 2024-03-05 DIAGNOSIS — E7800 Pure hypercholesterolemia, unspecified: Secondary | ICD-10-CM

## 2024-03-05 DIAGNOSIS — M25611 Stiffness of right shoulder, not elsewhere classified: Secondary | ICD-10-CM

## 2024-03-05 DIAGNOSIS — M79641 Pain in right hand: Secondary | ICD-10-CM

## 2024-03-05 DIAGNOSIS — N182 Chronic kidney disease, stage 2 (mild): Secondary | ICD-10-CM

## 2024-03-05 NOTE — Patient Instructions (Signed)
 Hypertension, Adult Hypertension is another name for high blood pressure. High blood pressure forces your heart to work harder to pump blood. This can cause problems over time. There are two numbers in a blood pressure reading. There is a top number (systolic) over a bottom number (diastolic). It is best to have a blood pressure that is below 120/80. What are the causes? The cause of this condition is not known. Some other conditions can lead to high blood pressure. What increases the risk? Some lifestyle factors can make you more likely to develop high blood pressure: Smoking. Not getting enough exercise or physical activity. Being overweight. Having too much fat, sugar, calories, or salt (sodium) in your diet. Drinking too much alcohol. Other risk factors include: Having any of these conditions: Heart disease. Diabetes. High cholesterol. Kidney disease. Obstructive sleep apnea. Having a family history of high blood pressure and high cholesterol. Age. The risk increases with age. Stress. What are the signs or symptoms? High blood pressure may not cause symptoms. Very high blood pressure (hypertensive crisis) may cause: Headache. Fast or uneven heartbeats (palpitations). Shortness of breath. Nosebleed. Vomiting or feeling like you may vomit (nauseous). Changes in how you see. Very bad chest pain. Feeling dizzy. Seizures. How is this treated? This condition is treated by making healthy lifestyle changes, such as: Eating healthy foods. Exercising more. Drinking less alcohol. Your doctor may prescribe medicine if lifestyle changes do not help enough and if: Your top number is above 130. Your bottom number is above 80. Your personal target blood pressure may vary. Follow these instructions at home: Eating and drinking  If told, follow the DASH eating plan. To follow this plan: Fill one half of your plate at each meal with fruits and vegetables. Fill one fourth of your plate  at each meal with whole grains. Whole grains include whole-wheat pasta, brown rice, and whole-grain bread. Eat or drink low-fat dairy products, such as skim milk or low-fat yogurt. Fill one fourth of your plate at each meal with low-fat (lean) proteins. Low-fat proteins include fish, chicken without skin, eggs, beans, and tofu. Avoid fatty meat, cured and processed meat, or chicken with skin. Avoid pre-made or processed food. Limit the amount of salt in your diet to less than 1,500 mg each day. Do not drink alcohol if: Your doctor tells you not to drink. You are pregnant, may be pregnant, or are planning to become pregnant. If you drink alcohol: Limit how much you have to: 0-1 drink a day for women. 0-2 drinks a day for men. Know how much alcohol is in your drink. In the [place] one drink equals one 12 oz bottle of beer (355 mL), one 5 oz glass of wine (148 mL), or one 1 oz glass of hard liquor (44 mL). Lifestyle  Work with your doctor to stay at a healthy weight or to lose weight. Ask your doctor what the best weight is for you. Get at least 30 minutes of exercise that causes your heart to beat faster (aerobic exercise) most days of the week. This may include walking, swimming, or biking. Get at least 30 minutes of exercise that strengthens your muscles (resistance exercise) at least 3 days a week. This may include lifting weights or doing Pilates. Do not smoke or use any products that contain nicotine or tobacco. If you need help quitting, ask your doctor. Check your blood pressure at home as told by your doctor. Keep all follow-up visits. Medicines Take over-the-counter and prescription medicines  only as told by your doctor. Follow directions carefully. Do not skip doses of blood pressure medicine. The medicine does not work as well if you skip doses. Skipping doses also puts you at risk for problems. Ask your doctor about side effects or reactions to medicines that you should watch  for. Contact a doctor if: You think you are having a reaction to the medicine you are taking. You have headaches that keep coming back. You feel dizzy. You have swelling in your ankles. You have trouble with your vision. Get help right away if: You get a very bad headache. You start to feel mixed up (confused). You feel weak or numb. You feel faint. You have very bad pain in your: Chest. Belly (abdomen). You vomit more than once. You have trouble breathing. These symptoms may be an emergency. Get help right away. Call 911. Do not wait to see if the symptoms will go away. Do not drive yourself to the hospital. Summary Hypertension is another name for high blood pressure. High blood pressure forces your heart to work harder to pump blood. For most people, a normal blood pressure is less than 120/80. Making healthy choices can help lower blood pressure. If your blood pressure does not get lower with healthy choices, you may need to take medicine. This information is not intended to replace advice given to you by your health care provider. Make sure you discuss any questions you have with your health care provider. Document Revised: 01/08/2021 Document Reviewed: 01/08/2021 Elsevier Patient Education  2024 [employer].

## 2024-03-05 NOTE — Assessment & Plan Note (Signed)
 Chronic pain and stiffness in right hand, arm, and shoulder. Differential includes arthritis and adhesive capsulitis.  - Recommended Tylenol  for pain, especially in the morning. - Instructed on wall walking exercises for shoulder mobility. - Referred to orthopedic specialist for further evaluation. - Continue occupational therapy.

## 2024-03-05 NOTE — Assessment & Plan Note (Signed)
 Chronic.  Blood sugars range from 112 to 151 mg/dL. A1c at 8.4%. Target A1c below 8%, ideally below 7.5%. - Continue Ozempic  0.25 mg, due to decreased appetite.  - Schedule diabetes check in three to four months. - Check cholesterol and B12 level - Encourage regular exercise and dietary modifications.

## 2024-03-05 NOTE — Progress Notes (Unsigned)
 I,April Larson, CMA,acting as a neurosurgeon for April LOISE Slocumb, MD.,have documented all relevant documentation on the behalf of April LOISE Slocumb, MD,as directed by  April LOISE Slocumb, MD while in the presence of April LOISE Slocumb, MD.  Subjective:  Patient ID: April Larson , female    DOB: [birth year] , 73 y.o.   MRN: [id number]  Chief Complaint  Patient presents with   Hypertension    Patient presents today for a DM and BP & chol check. She is accompanied by her sister today.  Denies headache, chest pain & sob.  She complains of right arm pain. She has used icy hot at home. She feels she had a stroke.  Letter sent for dm eye exam.    Diabetes   Hyperlipidemia    HPI Discussed the use of AI scribe software for clinical note transcription with the patient, who gave verbal consent to proceed.  History of Present Illness April Larson is a 73 year old female with diabetes and atypical Parkinson's who presents with right hand and arm pain. She is accompanied by her sister.  She experiences a dull ache in her right hand and arm, which has worsened over the past two weeks. Initially, she was concerned about a stroke due to weakness in her right hand, but she did not seek emergency care. The pain does not radiate and is not associated with tingling. She has tried Tylenol  at night but is unsure of its effectiveness as she does not take it during the day.  She has a history of diabetes and monitors her blood sugar levels, which range from 140 to 152 mg/dL. She uses a CPAP machine for 7 to 8 hours per night. An eye exam in September showed changes related to diabetes.  She has atypical Parkinson's, which affects her speech and gait. She feels weaker in her right arm and has difficulty with dexterity.  She experiences difficulty raising her right arm and avoids sleeping on her right side due to the pain.  She engages in physical activity, alternating between exercises  and riding a stationary bicycle every other day.   Diabetes She presents for her follow-up diabetic visit. She has type 2 diabetes mellitus. Pertinent negatives for hypoglycemia include no headaches. Associated symptoms include blurred vision. Pertinent negatives for diabetes include no polydipsia, no polyphagia and no polyuria. Risk factors for coronary artery disease include diabetes mellitus, dyslipidemia, hypertension, post-menopausal and sedentary lifestyle. She participates in exercise intermittently. Her breakfast blood glucose is taken between 8-9 am. Her breakfast blood glucose range is generally 70-90 mg/dl. An ACE inhibitor/angiotensin II receptor blocker is being taken. Eye exam is current.  Hypertension This is a chronic problem. The current episode started more than 1 year ago. Condition status: FAIR CONTROL. Associated symptoms include blurred vision. Pertinent negatives include no anxiety or headaches. The current treatment provides moderate improvement.     Past Medical History:  Diagnosis Date   Diabetes mellitus without complication (HCC)    Hypertension    Parkinson disease (HCC)    Substance abuse (HCC)      Family History  Problem Relation Age of Onset   Cancer Mother    Diabetes Father    Cancer Brother    Breast cancer Maternal Aunt    Parkinsonism Neg Hx    Sleep apnea Neg Hx      Current Outpatient Medications:    ACCU-CHEK FASTCLIX LANCETS MISC, USE TO CHECK BLOOD SUGARS TWICE DAILY, Disp:  100 each, Rfl: 11   Alcohol Swabs (B-D SINGLE USE SWABS REGULAR) PADS, USE AS NEEDED., Disp: 100 each, Rfl: 0   aspirin  EC 81 MG tablet, Take 1 tablet (81 mg total) by mouth daily., Disp: 90 tablet, Rfl: 1   atorvastatin  (LIPITOR) 10 MG tablet, Take 1 tablet (10 mg total) by mouth daily., Disp: 90 tablet, Rfl: 3   Biotin  10 MG CAPS, Take 1 tablet by mouth daily. (Patient taking differently: Take 10 mg by mouth See admin instructions. Take 10 mg by mouth at 10:30 AM),  Disp: 90 capsule, Rfl: 1   Blood Glucose Monitoring Suppl (TRUE METRIX AIR GLUCOSE METER) DEVI, USE AS DIRECTED TO CHECK BLOOD SUGARS., Disp: 1 each, Rfl: 0   Blood Glucose Monitoring Suppl (TRUE METRIX METER) w/Device KIT, Use as directed to check blood sugar once daily, Disp: 1 kit, Rfl: 1   calcium  carbonate (OSCAL) 1500 (600 Ca) MG TABS tablet, Take 1 tablet (1,500 mg total) by mouth daily with breakfast. (Patient taking differently: Take 1,500 mg by mouth See admin instructions. Take 1,500 mg by mouth at 10:30 AM), Disp: 90 tablet, Rfl: 1   carbidopa -levodopa  (SINEMET  IR) 25-100 MG tablet, Take 1 tablet by mouth 4 (four) times daily (1 tablet before breakfast, 1 tablet at noon, 1 tablet in the evening, and 1 tablet at bedtime)., Disp: 120 tablet, Rfl: 5   Cholecalciferol  (VITAMIN D3) 125 MCG (5000 UT) CAPS, Take 5,000 Units by mouth See admin instructions. Take 5,000 units by mouth at 10:30 AM, Disp: , Rfl:    diclofenac  Sodium (VOLTAREN ) 1 % GEL, APPLY 2 GRAMS TOPICALLY TO THE AFFECTED AREA FOUR TIMES DAILY (Patient taking differently: Apply 2 g topically See admin instructions. Apply 2 grams to the neck, under the breasts, and to the back 2 times a day), Disp: 100 g, Rfl: 2   fluticasone  (FLONASE ) 50 MCG/ACT nasal spray, Place 2 sprays into both nostrils daily. (Patient taking differently: Place 2 sprays into both nostrils in the morning.), Disp: 16 g, Rfl: 2   glucose blood (TRUE METRIX BLOOD GLUCOSE TEST) test strip, Use as instructed, Disp: 100 each, Rfl: 12   Insulin  Pen Needle (B-D ULTRAFINE III SHORT PEN) 31G X 8 MM MISC, Use as directed, Disp: 100 each, Rfl: 11   loratadine  (CLARITIN ) 10 MG tablet, Take 1 tablet by mouth daily (Patient taking differently: Take 10 mg by mouth in the morning. Take 10 mg by mouth at 10:30 AM), Disp: 90 tablet, Rfl: 1   MAGNESIUM  PO, Take 1 tablet by mouth at bedtime., Disp: , Rfl:    Multiple Vitamins-Minerals (ONE-A-DAY WOMENS 50+ ADVANTAGE) TABS, Take 1  tablet by mouth daily (Patient taking differently: Take 1 tablet by mouth See admin instructions. Take 1 tablet by mouth at 10:30 AM), Disp: 90 tablet, Rfl: 1   Omega-3 Fatty Acids (FISH OIL ) 1000 MG CAPS, Take 1 capsule by mouth daily (Patient taking differently: Take 1,000 mg by mouth See admin instructions. Take 1,000 mg by mouth at 3 PM), Disp: 90 capsule, Rfl: 1   omeprazole  (PRILOSEC) 40 MG capsule, Take 1 capsule (40 mg total) by mouth daily before breakfast., Disp: 90 capsule, Rfl: 2   OZEMPIC , 0.25 OR 0.5 MG/DOSE, 2 MG/3ML SOPN, INJECT 0.25MG  UNDER THE SKIN ONCE WEEKLY, Disp: 3 mL, Rfl: 0   rivastigmine  (EXELON ) 3 MG capsule, Take 1 capsule (3 mg total) by mouth 2 (two) times daily., Disp: 60 capsule, Rfl: 5   telmisartan -hydrochlorothiazide  (MICARDIS  HCT) 40-12.5 MG tablet,  Take 1 tablet by mouth every morning., Disp: 90 tablet, Rfl: 1   TRUEplus Lancets 28G MISC, USE AS DIRECTED TO CHECK BLOOD SUGARS., Disp: 100 each, Rfl: 1   Turmeric 500 MG CAPS, Take 1 capsule by mouth 2 (two) times daily. (Patient taking differently: Take 500 mg by mouth See admin instructions. Take 500 mg by mouth at 7 AM and 4:30 PM), Disp: 180 capsule, Rfl: 1   TYLENOL  8 HOUR ARTHRITIS PAIN 650 MG CR tablet, Take 1,300 mg by mouth at bedtime., Disp: , Rfl:    vitamin C  (ASCORBIC ACID ) 500 MG tablet, Take 1 tablet (500 mg total) by mouth daily. (Patient taking differently: Take 500 mg by mouth See admin instructions. Take 500 mg by mouth at 10:30 AM), Disp: 90 tablet, Rfl: 1   No Known Allergies   Review of Systems  Constitutional: Negative.   Eyes:  Positive for blurred vision.  Respiratory: Negative.    Cardiovascular: Negative.   Gastrointestinal: Negative.   Endocrine: Negative for polydipsia, polyphagia and polyuria.  Musculoskeletal:  Positive for arthralgias.  Neurological: Negative.  Negative for headaches.  Psychiatric/Behavioral: Negative.       Today's Vitals   03/05/24 1507  BP: 126/80  Pulse:  94  Temp: 98.3 F (36.8 C)  SpO2: 98%  Weight: 150 lb 6.4 oz (68.2 kg)  Height: 5' 2 (1.575 m)   Body mass index is 27.51 kg/m.  Wt Readings from Last 3 Encounters:  03/05/24 150 lb 6.4 oz (68.2 kg)  12/27/23 149 lb (67.6 kg)  11/29/23 149 lb (67.6 kg)     Objective:  Physical Exam Vitals and nursing note reviewed.  Constitutional:      Appearance: Normal appearance.  HENT:     Head: Normocephalic and atraumatic.  Cardiovascular:     Rate and Rhythm: Normal rate and regular rhythm.     Heart sounds: Normal heart sounds.  Pulmonary:     Effort: Pulmonary effort is normal.     Breath sounds: Normal breath sounds.  Musculoskeletal:     Cervical back: Normal range of motion.     Comments: Decreased ROM of right shoulder  Skin:    General: Skin is warm.  Neurological:     General: No focal deficit present.     Mental Status: She is alert.     Comments: She does not have full EOM, looks up during entire visit Shuffling gait  Psychiatric:        Mood and Affect: Mood normal.        Behavior: Behavior normal.       Assessment And Plan:  Benign hypertension with chronic kidney disease, stage II [I12.9, N18.2] Assessment & Plan: Chronic, stable. She is encouraged to check her blood pressures at home. All questions were answered to her satisfaction. 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   - Continue telmisartan  and hydrochlorothiazide  daily. - Encourage regular exercise.   Diabetes mellitus with stage 2 chronic kidney disease (HCC) [E11.22, N18.2] Assessment & Plan: Chronic.  Blood sugars range from 112 to 151 mg/dL. A1c at 8.4%. Target A1c below 8%, ideally below 7.5%. - Continue Ozempic  0.25 mg, due to decreased appetite.  - Schedule diabetes check in three to four months. - Check cholesterol and B12 level - Encourage regular exercise and dietary modifications.  Orders: -      CMP14+EGFR -  Hemoglobin A1c  Pure hypercholesterolemia [E78.00] Assessment & Plan: Chronic, currently on atorvastatin  10mg  daily.  LDL goal is less than 70. Importance of medication/dietary compliance was discussed with the patient.    Right hand pain Assessment & Plan: Negative squeeze test. Will check labs as below.   Orders: -     ANA, IFA (with reflex) -     CYCLIC CITRUL PEPTIDE ANTIBODY, IGG/IGA -     Rheumatoid factor -     Sedimentation rate -     Uric acid -     Ambulatory referral to Orthopedic Surgery  Decreased ROM of right shoulder Assessment & Plan: Chronic pain and stiffness in right hand, arm, and shoulder. Differential includes arthritis and adhesive capsulitis.  - Recommended Tylenol  for pain, especially in the morning. - Instructed on wall walking exercises for shoulder mobility. - Referred to orthopedic specialist for further evaluation. - Continue occupational therapy.  Orders: -     Ambulatory referral to Orthopedic Surgery  OSA on CPAP Assessment & Plan: Chronic, reports compliance with CPAP.  States she is now using 7-8 hours per night.       Return for 4 month dm f/u.SABRA  Patient was given opportunity to ask questions. Patient verbalized understanding of the plan and was able to repeat key elements of the plan. All questions were answered to their satisfaction.   I, April LOISE Slocumb, MD, have reviewed all documentation for this visit. The documentation on 03/05/24 for the exam, diagnosis, procedures, and orders are all accurate and complete.   IF YOU HAVE BEEN REFERRED TO A SPECIALIST, IT MAY TAKE 1-2 WEEKS TO SCHEDULE/PROCESS THE REFERRAL. IF YOU HAVE NOT HEARD FROM US /SPECIALIST IN TWO WEEKS, PLEASE GIVE US  A CALL AT [phone] X [phone].   THE PATIENT IS ENCOURAGED TO PRACTICE SOCIAL DISTANCING DUE TO THE COVID-19 PANDEMIC.

## 2024-03-05 NOTE — Assessment & Plan Note (Signed)
 Negative squeeze test. Will check labs as below.

## 2024-03-06 ENCOUNTER — Ambulatory Visit: Payer: Self-pay | Admitting: Internal Medicine

## 2024-03-06 DIAGNOSIS — R7689 Other specified abnormal immunological findings in serum: Secondary | ICD-10-CM

## 2024-03-06 LAB — CMP14+EGFR
ALT: 6 IU/L (ref 0–32)
AST: 11 IU/L (ref 0–40)
Albumin: 4.6 g/dL (ref 3.8–4.8)
Alkaline Phosphatase: 139 IU/L — ABNORMAL HIGH (ref 49–135)
BUN/Creatinine Ratio: 24 (ref 12–28)
BUN: 21 mg/dL (ref 8–27)
Bilirubin Total: 0.2 mg/dL (ref 0.0–1.2)
CO2: 21 mmol/L (ref 20–29)
Calcium: 9.9 mg/dL (ref 8.7–10.3)
Chloride: 102 mmol/L (ref 96–106)
Creatinine, Ser: 0.88 mg/dL (ref 0.57–1.00)
Globulin, Total: 2.9 g/dL (ref 1.5–4.5)
Glucose: 256 mg/dL — ABNORMAL HIGH (ref 70–99)
Potassium: 4.4 mmol/L (ref 3.5–5.2)
Sodium: 138 mmol/L (ref 134–144)
Total Protein: 7.5 g/dL (ref 6.0–8.5)
eGFR: 70 mL/min/1.73 (ref >59)

## 2024-03-06 LAB — SEDIMENTATION RATE: Sed Rate: 11 mm/h (ref 0–40)

## 2024-03-06 LAB — FANA STAINING PATTERNS
Homogeneous Pattern: 1:160 {titer} — ABNORMAL HIGH
Nuclear Dot Pattern: 1:160 {titer} — ABNORMAL HIGH

## 2024-03-06 LAB — ANTINUCLEAR ANTIBODIES, IFA: ANA Titer 1: POSITIVE — AB

## 2024-03-06 LAB — CYCLIC CITRUL PEPTIDE ANTIBODY, IGG/IGA: Cyclic Citrullin Peptide Ab: 10 U (ref 0–19)

## 2024-03-06 LAB — HEMOGLOBIN A1C
Est. average glucose Bld gHb Est-mCnc: 240 mg/dL
Hgb A1c MFr Bld: 10 % — ABNORMAL HIGH (ref 4.8–5.6)

## 2024-03-06 LAB — URIC ACID: Uric Acid: 3.8 mg/dL (ref 3.1–7.9)

## 2024-03-06 LAB — RHEUMATOID FACTOR: Rheumatoid fact SerPl-aCnc: <10 [IU]/mL (ref <14.0)

## 2024-03-06 LAB — SPECIMEN STATUS REPORT

## 2024-03-08 ENCOUNTER — Encounter: Payer: Self-pay | Admitting: Psychology

## 2024-03-08 ENCOUNTER — Encounter: Attending: Psychology | Admitting: Psychology

## 2024-03-08 DIAGNOSIS — R269 Unspecified abnormalities of gait and mobility: Secondary | ICD-10-CM | POA: Insufficient documentation

## 2024-03-08 DIAGNOSIS — F4323 Adjustment disorder with mixed anxiety and depressed mood: Secondary | ICD-10-CM | POA: Insufficient documentation

## 2024-03-08 DIAGNOSIS — G20C Parkinsonism, unspecified: Secondary | ICD-10-CM | POA: Diagnosis present

## 2024-03-08 DIAGNOSIS — G4733 Obstructive sleep apnea (adult) (pediatric): Secondary | ICD-10-CM | POA: Diagnosis not present

## 2024-03-08 DIAGNOSIS — R413 Other amnesia: Secondary | ICD-10-CM | POA: Insufficient documentation

## 2024-03-08 NOTE — Progress Notes (Signed)
 Neuropsychology Visit  Patient:  April Larson   DOB: 04-07-1950  MR Number: [id number]  Location: [facility] [address] [place], [address] [place] [address] Dept: [phone]  Date of Service: 12//2025  Start: 2 PM End: 12 PM  Today's visit was conducted in my outpatient clinic office with the patient myself present.  Duration of Service: 1 Hour  Provider/Observer:     Norleen JONELLE Asa PsyD  Chief Complaint:      Chief Complaint  Patient presents with   Memory Loss   Loss of Vision   Fall   Gait Problem   Sleeping Problem   Depression    Reason For Service:     April Larson is a 73 year old female referred for neuropsychological consultation and psychotherapeutic interventions for adjustment disorder with depression and anxiety symptoms, and memory problems that have worsened over the past two years. Symptoms began approximately three years ago with a near-syncope event. Subsequently developed motor changes and was referred to neurology. 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 BP medications were stopped, resolving orthostasis symptoms. Has also experienced rigidity, instability, atypical eye issues, and falls in 2023. Current medications include Exelon  1.5 mg BID since September 2022, which sister feels is helping. Diagnosed with obstructive sleep apnea and prescribed a CPAP.  Reports using CPAP for seven hours nightly with brother's assistance for proper placement. No side effects or difficulties experienced. Initial fears about CPAP machine have diminished. Reports sleeping later until 9-9:30 AM when going to bed at 11:30 PM, getting approximately 10 hours of sleep. Feels this is  too much sleep. Mood remains unchanged. Reports recent fall where she was found on the floor but does not remember falling. Fell backwards while getting up from the toilet. No head injury or other injuries sustained.  Treatment Interventions:  Psychoeducation provided regarding CPAP use, fall prevention strategies, and importance of consistent safety measures. Discussed systematic desensitization approach to CPAP fears. Addressed importance of using bathroom guard rails consistently to prevent falls. Provided chair exercise recommendations for strengthening and fall prevention. Discussed neurological condition and its relationship to neurological changes.  Participation Level:   Active  Participation Quality:  Appropriate      Behavioral Observation:  Well Groomed, Alert, and Appropriate.   Current Psychosocial Factors:  Family members worry about her safety and well-being. Experiences challenges with accepting changes in functional capacity. Struggles with consistent use of safety equipment due to belief she can manage without assistance.  Content of Session:   Reviewed CPAP compliance and progress. Discussed recent fall and safety concerns. Addressed inconsistent use of bathroom guard rails. Provided psychoeducation about fall prevention strategies and importance of habit formation. Discussed chair exercise program for strengthening. Explored pseudobulbar affect and its impact on emotional expression. Reviewed importance of doing what she can independently to reduce family burden.  Effectiveness of Interventions: Demonstrated good understanding of safety recommendations. Showed willingness to implement consistent use of bathroom guard rails. Engaged in discussion about functional capacity changes and acceptance of current limitations.  Target Goals:   Continue CPAP compliance. Implement consistent use of bathroom safety equipment. Begin chair exercise program for strengthening. Increase  independence in activities of daily living. Reduce family worry through proactive safety measures.  Goals Last Reviewed:   12//2025  Goals Addressed Today:  CPAP compliance and fear reduction. Fall prevention strategies and safety equipment use. Strengthening exercises and functional capacity improvement. Family support and burden reduction strategies.  Impression/Diagnosis:    April Larson is a 73 year old female referred for neuropsychological consultation and consideration of psychotherapeutic interventions for adjustment disorder with depression and anxiety type symptoms, memory problems that have worsened over the past 2 years.  While objective neuropsychological assessment of memory functions will likely be conducted, the initial focus will be on adjustment and coping issues with her progressively worsening condition and neurological status.   Diagnosis:   Adjustment disorder with mixed anxiety and depressed mood  Memory loss  OSA on CPAP  Gait abnormality  Atypical parkinsonism (HCC)    Norleen Asa, Psy.D. Clinical Psychologist Neuropsychologist

## 2024-03-09 ENCOUNTER — Ambulatory Visit: Admitting: Physical Therapy

## 2024-03-11 NOTE — Assessment & Plan Note (Signed)
 Chronic, reports compliance with CPAP.  States she is now using 7-8 hours per night.

## 2024-03-11 NOTE — Assessment & Plan Note (Signed)
 Chronic, currently on atorvastatin 10mg  daily.  LDL goal is less than 70. Importance of medication/dietary compliance was discussed with the patient.

## 2024-03-11 NOTE — Assessment & Plan Note (Signed)
 Chronic, stable. She is encouraged to check her blood pressures at home. All questions were answered to her satisfaction. 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   - Continue telmisartan  and hydrochlorothiazide  daily. - Encourage regular exercise.

## 2024-03-14 ENCOUNTER — Ambulatory Visit: Attending: Internal Medicine | Admitting: Physical Therapy

## 2024-03-14 DIAGNOSIS — R2681 Unsteadiness on feet: Secondary | ICD-10-CM | POA: Insufficient documentation

## 2024-03-14 DIAGNOSIS — M6281 Muscle weakness (generalized): Secondary | ICD-10-CM | POA: Insufficient documentation

## 2024-03-14 DIAGNOSIS — R41840 Attention and concentration deficit: Secondary | ICD-10-CM

## 2024-03-14 DIAGNOSIS — R278 Other lack of coordination: Secondary | ICD-10-CM

## 2024-03-14 NOTE — Therapy (Signed)
 OUTPATIENT PHYSICAL THERAPY NEURO TREATMENT -DISCHARGE SUMMARY    Patient Name: April Larson MRN: [id number] DOB:1950/11/26, 73 y.o., female Today's Date: 03/14/2024   PCP: Jarold Medici, MD  REFERRING PROVIDER: Buck Saucer, MD   PHYSICAL THERAPY DISCHARGE SUMMARY  Visits from Start of Care: 2  Current functional level related to goals / functional outcomes: Pt requires 24/7 assistance for all ADLS w/use of RW in household distances and transport chair for community distances    Remaining deficits: PSP, impaired vision, high fall risk    Education / Equipment: HEP   Patient agrees to discharge. Patient goals were met. Patient is being discharged due to maximized rehab potential.    END OF SESSION:  PT End of Session - 03/14/24 1538     Visit Number 2    Number of Visits 2    Date for Recertification  03/26/24    Authorization Type [employer] Dual Complete    PT Start Time 1535    PT Stop Time 1553   DC   PT Time Calculation (min) 18 min    Activity Tolerance Patient tolerated treatment well    Behavior During Therapy WFL for tasks assessed/performed           Past Medical History:  Diagnosis Date   Diabetes mellitus without complication (HCC)    Hypertension    Parkinson disease (HCC)    Substance abuse (HCC)    Past Surgical History:  Procedure Laterality Date   EYE SURGERY     Patient Active Problem List   Diagnosis Date Noted   Right hand pain 03/05/2024   Decreased ROM of right shoulder 03/05/2024   Hepatic steatosis 12/04/2023   OSA on CPAP 05/17/2023   Anemia 04/03/2023   Supranuclear palsy (HCC) 04/03/2023   Atypical syncope 03/04/2023   Seizure disorder (HCC) 03/04/2023   Seizure-like activity (HCC) 03/02/2023   Thrombocytosis 12/06/2022   Gait abnormality 12/06/2022   Encounter for annual physical exam 11/24/2022   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   Decreased dorsalis pedis pulse 11/17/2020   Tachycardia 08/11/2020    Cough 08/11/2020   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   Pure hypercholesterolemia 01/23/2020   Vitamin D  deficiency 10/23/2019   Right hip pain 05/15/2018   Estrogen deficiency 05/15/2018   Class 1 obesity due to excess calories with serious comorbidity and body mass index (BMI) of 31.0 to 31.9 in adult 05/15/2018   Diabetes mellitus with stage 2 chronic kidney disease (HCC) 01/11/2018   Benign hypertension with chronic kidney disease, stage II 01/11/2018    ONSET DATE: 02/02/2024 (referral)   REFERRING DIAG: R26.81 (ICD-10-CM) - Unsteadiness on feet R27.8 (ICD-10-CM) - Other lack of coordination R41.842 (ICD-10-CM) - Visuospatial deficit G20.C (ICD-10-CM) - Atypical parkinsonism (HCC) G23.1 (ICD-10-CM) - Supranuclear palsy (HCC)  THERAPY DIAG:  Unsteadiness on feet  Other lack of coordination  Muscle weakness (generalized)  Attention and concentration deficit  Rationale for Evaluation and Treatment: Rehabilitation  SUBJECTIVE:  SUBJECTIVE STATEMENT: Pt presents in transport chair w/sister and brother. Sister reports this is the first outing in which they have used the transport chair and it went well, is much easier than using RW. Pt denies falls or acute changes but reports pain in her R hand. Sister reports pt did see PCP about this and had labs and an X-ray performed, but have not received results of this yet. Has an OT referral and will schedule that evaluation today.    Pt accompanied by: Darin Hoover and brother   PERTINENT HISTORY: HTN, DM, high chol, CKD, Parkinsons (PSP)  PAIN:  Are you having pain? No  PRECAUTIONS: Fall  RED FLAGS: None   WEIGHT BEARING RESTRICTIONS: No  FALLS: Has patient fallen in last 6 months? Yes. Number of falls 4-5  LIVING ENVIRONMENT: Lives  with: Brother  Lives in: House/apartment Stairs: Yes: External: 3 steps; bilateral but cannot reach both Has following equipment at home: Walker - 2 wheeled  PLOF: Needs assistance with ADLs, Needs assistance with homemaking, Needs assistance with gait, and Needs assistance with transfers  PATIENT GOALS: to keep moving   OBJECTIVE:  Note: Objective measures were completed at Evaluation unless otherwise noted.  DIAGNOSTIC FINDINGS: MRI of brain from 03/03/2023 IMPRESSION: 1. No acute intracranial abnormality or significant interval change. 2. Stable mild atrophy and scattered white matter disease. This likely reflects the sequela of chronic microvascular ischemia.  COGNITION: Overall cognitive status: Impaired   SENSATION: Not tested   POSTURE: rounded shoulders, forward head, and upward gaze  LOWER EXTREMITY ROM:     Active  Right Eval Left Eval  Hip flexion    Hip extension    Hip abduction    Hip adduction    Hip internal rotation    Hip external rotation    Knee flexion    Knee extension    Ankle dorsiflexion    Ankle plantarflexion    Ankle inversion    Ankle eversion     (Blank rows = not tested)  LOWER EXTREMITY MMT:    MMT Right Eval Left Eval  Hip flexion    Hip extension    Hip abduction    Hip adduction    Hip internal rotation    Hip external rotation    Knee flexion    Knee extension    Ankle dorsiflexion    Ankle plantarflexion    Ankle inversion    Ankle eversion    (Blank rows = not tested)  BED MOBILITY:  Not tested Pt requires assistance for bed mobility   TRANSFERS: Sit to stand: Mod A  Assistive device utilized: Environmental Consultant - 2 wheeled     Stand to sit: Min A  Assistive device utilized: Environmental Consultant - 2 wheeled     Severe retropulsion   RAMP:  Not tested  CURB:  Not tested  STAIRS: Not tested GAIT: Gait pattern: step through pattern, decreased stride length, lateral hip instability, and lateral lean- Left Distance walked:  Various clinic distances  Assistive device utilized: Environmental Consultant - 2 wheeled Level of assistance: Min A Comments: Due to impaired vision, pt frequently drifting to L side or running into obstacles on L side, requiring min A to safely navigate clinic. Min cues to keep RW on ground as she occasionally will carry it.  TREATMENT:   Self-care/home management  Discussed importance of exercise w/pt and encouraged her to keep moving (with assistance) to maintain mobility. Sister reports pt often wants to stay in bed for prolonged periods during the day, so encouraged pt to sit up and be active during the day as much as possible. Pt inquiring if her vision would ever improve, so discussed progressive nature of PSP and again encouraged her to maintain her exercises.  Encouraged family to be proactive about getting a ramp installed at the house due to pt's worsening vision and safety.  Informed sister that she can obtain a cushion for transport chair as the arm rest height is tall for pt and this can help reduce shoulder shrug or strain on shoulders when using chair.  Discussed plan for 3 mo PD screens and pt in agreement.   PATIENT EDUCATION: Education details: See self-care above  Person educated: Patient and Sister and Brother Education method: Explanation, Facilities Manager, and Handouts Education comprehension: verbalized understanding and needs further education  HOME EXERCISE PROGRAM: From previous POC:  Access Code: [email or web address] URL: [email or web address] Date: 07/04/2023 Prepared by: Sheffield Senate   Exercises - Sit to Stand with Armchair  - 2 x daily - 7 x weekly - 1 sets - 10 reps  - Seated knee extension   - 1 x daily - 7 x weekly - 3 sets - 10 reps - 2-3 seconds hold   - Rolling both ways in bed a few times a day with caregiver supervision initially  -  Bridge scoot in bed practice when going to bed 1x a day  GOALS: Goals reviewed with patient? Yes  STG = LTG DUE TO POC LENGTH    LONG TERM GOALS: Target date: 03/26/2024   Pt will obtain transport chair for home and community usage and caregiver will be independent w/managing transport chair for improved pt safety.  Baseline: sister to order chair on 11/24 Goal status: MET   ASSESSMENT:  CLINICAL IMPRESSION: Emphasis of skilled PT session on LTG assessment and DC from PT. Pt presented in new transport chair and sister reports pt is more easily transferred this way compared to using RW. Pt's house still has STE, so recommended family install a ramp as soon as possible due to pt's report of worsening vision and pt's safety. At this time, pt and family are compliant w/HEP and pt has routine of walking w/RW in the home w/assistance and riding a pedal bike. Pt encouraged to continue exercising regularly to maintains strength and mobility but was reminded she must have someone with her when doing so. Pt and family in agreement to DC from PT this date and will schedule OT eval to assess pain/weakness in R hand. Pt is scheduled for 43mo PD screens.     OBJECTIVE IMPAIRMENTS: Abnormal gait, decreased activity tolerance, decreased balance, decreased cognition, decreased coordination, decreased knowledge of condition, decreased knowledge of use of DME, decreased mobility, difficulty walking, decreased safety awareness, impaired perceived functional ability, impaired vision/preception, and improper body mechanics  ACTIVITY LIMITATIONS: carrying, lifting, bending, standing, squatting, stairs, transfers, bed mobility, bathing, toileting, dressing, reach over head, hygiene/grooming, locomotion level, and caring for others  PARTICIPATION LIMITATIONS: meal prep, cleaning, laundry, medication management, personal finances, interpersonal relationship, driving, shopping, community activity, yard work, and  church  PERSONAL FACTORS: Fitness, Past/current experiences, and 1 comorbidity: PSP are also affecting patient's functional outcome.   REHAB POTENTIAL: Fair due to PSP  CLINICAL DECISION MAKING: Evolving/moderate complexity  EVALUATION COMPLEXITY: Moderate  PLAN:  PT FREQUENCY: 1x/week  PT DURATION: 4 weeks (only one visit after evaluation)   PLANNED INTERVENTIONS: 02835- PT Re-evaluation, 97110-Therapeutic exercises, 97530- Therapeutic activity, [id number]- Neuromuscular re-education, 97535- Self Care, 02859- Manual therapy, Patient/Family education, DME instructions, and Wheelchair mobility training   Ieisha Gao E Lincoln Kleiner, PT, DPT 03/14/2024, 4:13 PM

## 2024-03-16 ENCOUNTER — Other Ambulatory Visit (HOSPITAL_COMMUNITY): Payer: Self-pay

## 2024-03-16 ENCOUNTER — Other Ambulatory Visit: Payer: Self-pay

## 2024-03-16 MED FILL — Omeprazole Cap Delayed Release 40 MG: ORAL | 30 days supply | Qty: 30 | Fill #7 | Status: AC

## 2024-03-19 ENCOUNTER — Other Ambulatory Visit: Payer: Self-pay

## 2024-03-19 ENCOUNTER — Other Ambulatory Visit

## 2024-03-19 DIAGNOSIS — R7689 Other specified abnormal immunological findings in serum: Secondary | ICD-10-CM

## 2024-03-20 LAB — MITOCHONDRIAL ANTIBODIES: Mitochondrial Ab: <20 U (ref 0.0–20.0)

## 2024-03-20 LAB — GAMMA GT: GGT: 19 IU/L (ref 0–60)

## 2024-03-20 LAB — ANTI-SMOOTH MUSCLE ANTIBODY, IGG: Smooth Muscle Ab: 8 U (ref 0–19)

## 2024-03-25 ENCOUNTER — Ambulatory Visit: Payer: Self-pay | Admitting: Internal Medicine

## 2024-04-04 NOTE — Therapy (Incomplete)
 " OUTPATIENT OCCUPATIONAL THERAPY PARKINSON'S EVALUATION  Patient Name: April Larson MRN: [id number] DOB:[birth year], 73 y.o., female Today's Date: 04/04/2024  PCP: *** REFERRING PROVIDER: Buck Saucer, MD   END OF SESSION:   Past Medical History:  Diagnosis Date   Diabetes mellitus without complication (HCC)    Hypertension    Parkinson disease (HCC)    Substance abuse (HCC)    Past Surgical History:  Procedure Laterality Date   EYE SURGERY     Patient Active Problem List   Diagnosis Date Noted   Right hand pain 03/05/2024   Decreased ROM of right shoulder 03/05/2024   Hepatic steatosis 12/04/2023   OSA on CPAP 05/17/2023   Anemia 04/03/2023   Supranuclear palsy (HCC) 04/03/2023   Atypical syncope 03/04/2023   Seizure disorder (HCC) 03/04/2023   Seizure-like activity (HCC) 03/02/2023   Thrombocytosis 12/06/2022   Gait abnormality 12/06/2022   Encounter for annual physical exam 11/24/2022   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   Decreased dorsalis pedis pulse 11/17/2020   Tachycardia 08/11/2020   Cough 08/11/2020   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   Pure hypercholesterolemia 01/23/2020   Vitamin D  deficiency 10/23/2019   Right hip pain 05/15/2018   Estrogen deficiency 05/15/2018   Class 1 obesity due to excess calories with serious comorbidity and body mass index (BMI) of 31.0 to 31.9 in adult 05/15/2018   Diabetes mellitus with stage 2 chronic kidney disease (HCC) 01/11/2018   Benign hypertension with chronic kidney disease, stage II 01/11/2018    ONSET DATE: 02/08/24 (referral date)   REFERRING DIAG: G20ICD-10-CMPrimary parkinsonism (HCC)   THERAPY DIAG:  No diagnosis found.  Rationale for Evaluation and Treatment: Rehabilitation  SUBJECTIVE:   SUBJECTIVE STATEMENT: *** Pt accompanied by: {accompnied:27141}  PERTINENT HISTORY: ***  PRECAUTIONS: {Therapy precautions:24002}  WEIGHT BEARING RESTRICTIONS: {Yes  ***/No:24003}  PAIN:  Are you having pain? {OPRCPAIN:27236}  FALLS: Has patient fallen in last 6 months? {fallsyesno:27318}  LIVING ENVIRONMENT: Lives with: {OPRC lives with:25569::lives with their family} Lives in: {Lives in:25570} Stairs: {opstairs:27293} Has following equipment at home: {Assistive devices:23999}  PLOF: {PLOF:24004}  PATIENT GOALS: ***  OBJECTIVE:  Note: Objective measures were completed at Evaluation unless otherwise noted.  HAND DOMINANCE: {MISC; OT HAND DOMINANCE:2797973025}  ADLs: Overall ADLs: *** Transfers/ambulation related to ADLs: Eating: *** Grooming: *** UB Dressing: *** LB Dressing: *** Toileting: *** Bathing: *** Tub Shower transfers: *** Equipment: {equipment:25573}  IADLs: Shopping: *** Light housekeeping: *** Meal Prep: *** Community mobility: *** Medication management: *** Financial management: *** Handwriting: {OTWRITTENEXPRESSION:25361}  MOBILITY STATUS: {OTMOBILITY:25360}  POSTURE COMMENTS:  {posture:25561}  ACTIVITY TOLERANCE: Activity tolerance: ***  FUNCTIONAL OUTCOME MEASURES: {PDoutcomemeasures:27287}  COORDINATION: {otcoordination:27237}  UE ROM:  {PDMEASUREMENT:27288}  UE MMT:   {PDMEASUREMENT:27288}  SENSATION: {sensation:27233}  MUSCLE TONE: {UETONE:25567}  COGNITION: Overall cognitive status: {cognition:24006}  OBSERVATIONS: {PDobservations:27291}                                                                                                                    TREATMENT DATE: ***  PATIENT EDUCATION: Education details: *** Person educated: {Person educated:25204} Education method: {Education Method:25205} Education comprehension: {Education Comprehension:25206}  HOME EXERCISE PROGRAM: ***  GOALS: Goals reviewed with patient? {yes/no:20286}  SHORT TERM GOALS: Target date: ***  *** Baseline: Goal status: INITIAL  2.  *** Baseline:  Goal status: INITIAL  3.  *** Baseline:   Goal status: INITIAL  4.  *** Baseline:  Goal status: INITIAL  5.  *** Baseline:  Goal status: INITIAL  6.  *** Baseline:  Goal status: INITIAL  LONG TERM GOALS: Target date: ***  *** Baseline:  Goal status: INITIAL  2.  *** Baseline:  Goal status: INITIAL  3.  *** Baseline:  Goal status: INITIAL  4.  *** Baseline:  Goal status: INITIAL  5.  *** Baseline:  Goal status: INITIAL  6.  *** Baseline:  Goal status: INITIAL ASSESSMENT:  CLINICAL IMPRESSION: Patient is a *** y.o. *** who was seen today for occupational therapy evaluation for ***.   PERFORMANCE DEFICITS: in functional skills including {OT physical skills:25468}, cognitive skills including {OT cognitive skills:25469}, and psychosocial skills including {OT psychosocial skills:25470}.   IMPAIRMENTS: are limiting patient from {OT performance deficits:25471}.   COMORBIDITIES:  {Comorbidities:25485} that affects occupational performance. Patient will benefit from skilled OT to address above impairments and improve overall function.  MODIFICATION OR ASSISTANCE TO COMPLETE EVALUATION: {OT modification:25474}  OT OCCUPATIONAL PROFILE AND HISTORY: {OT PROFILE AND HISTORY:25484}  CLINICAL DECISION MAKING: {OT CDM:25475}  REHAB POTENTIAL: {rehabpotential:25112}  EVALUATION COMPLEXITY: {Evaluation complexity:25115}    PLAN:  OT FREQUENCY: {rehab frequency:25116}  OT DURATION: {rehab duration:25117}  PLANNED INTERVENTIONS: {OT Interventions:25467}  RECOMMENDED OTHER SERVICES: ***  CONSULTED AND AGREED WITH PLAN OF CARE: {ENR:74513}  PLAN FOR NEXT SESSION: ***   Burnard JINNY Roads, OT 04/04/2024, 9:27 AM   "

## 2024-04-09 ENCOUNTER — Other Ambulatory Visit: Payer: Self-pay

## 2024-04-09 ENCOUNTER — Ambulatory Visit: Admitting: Occupational Therapy

## 2024-04-15 NOTE — Therapy (Signed)
 " OUTPATIENT OCCUPATIONAL THERAPY [facility]ALU[facility]  Patient Name: April Larson MRN: [id number] DOB:01-Jul-1950, 74 y.o., female Today's Date: 04/16/2024  PCP: Catheryn Slocumb, MD REFERRING PROVIDER: Buck Saucer, MD  END OF SESSION:  OT End of Session - 04/16/24 1228     Visit Number 1    Number of Visits 17    Date for Recertification  06/15/24    Authorization Type [employer] Dual 2026 Covered 100% VL: [place] NO AUTH REQUIRED    Authorization - Visit Number 1    Authorization - Number of Visits 10    Progress Note Due on Visit 10    OT Start Time 1106    OT Stop Time 1158    OT Time Calculation (min) 52 min          Past Medical History:  Diagnosis Date   Diabetes mellitus without complication (HCC)    Hypertension    Parkinson disease (HCC)    Substance abuse (HCC)    Past Surgical History:  Procedure Laterality Date   EYE SURGERY     Patient Active Problem List   Diagnosis Date Noted   Right hand pain 03/05/2024   Decreased ROM of right shoulder 03/05/2024   Hepatic steatosis 12/04/2023   OSA on CPAP 05/17/2023   Anemia 04/03/2023   Supranuclear palsy (HCC) 04/03/2023   Atypical syncope 03/04/2023   Seizure disorder (HCC) 03/04/2023   Seizure-like activity (HCC) 03/02/2023   Thrombocytosis 12/06/2022   Gait abnormality 12/06/2022   Encounter for annual physical exam 11/24/2022   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   Decreased dorsalis pedis pulse 11/17/2020   Tachycardia 08/11/2020   Cough 08/11/2020   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   Pure hypercholesterolemia 01/23/2020   Vitamin D  deficiency 10/23/2019   Right hip pain 05/15/2018   Estrogen deficiency 05/15/2018   Class 1 obesity due to excess calories with serious comorbidity and body mass index (BMI) of 31.0 to 31.9 in adult 05/15/2018   Diabetes mellitus with stage 2 chronic kidney disease (HCC) 01/11/2018   Benign hypertension with chronic kidney disease, stage II 01/11/2018     ONSET DATE: 01/31/24  OT screen  REFERRING DIAG: R26.81 (ICD-10-CM) - Unsteadiness on feet R41.842 (ICD-10-CM) - Visuospatial deficit G23.1 (ICD-10-CM) - 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) R27.8 (ICD-10-CM) - Other lack of coordination G20.C (ICD-10-CM) - Parkinsonism, unspecified  THERAPY DIAG:  Other lack of coordination  Unsteadiness on feet  Attention and concentration deficit  Frontal lobe and executive function deficit  Visuospatial deficit  Other symptoms and signs involving the musculoskeletal system  Other symptoms and signs involving the nervous system  Stiffness of right shoulder, not elsewhere classified  Stiffness of right elbow, not elsewhere classified  Rationale for Evaluation and Treatment: Rehabilitation  SUBJECTIVE:   SUBJECTIVE STATEMENT: Pt reports that vision and use of R hand is not as good as it was and now she is not eating with R hand. Pt accompanied by: sister--[name]  PERTINENT HISTORY:  Pt returns after follow-up OT screen 01/31/24 which recommended OT due to decline in functional measures and coordination.    PMH:  PSP, OSA on CPAP, HTN, DM, hx of seizure/seizure-like activity, hx of substance abuse.   PRECAUTIONS: Fall  WEIGHT BEARING RESTRICTIONS: No  PAIN:  Are you having pain? Yes: NPRS scale: 5-6/10 Pain location: R hand/forearm Pain description: constant Aggravating factors: nothing Relieving factors: icy hot helps a little  FALLS: Has patient fallen in last 6 months? Yes. Number of falls 4, 1 since PT  d/c--falls backwards (when she hasn't used walker)  LIVING ENVIRONMENT  Lives with: lives with their family (brother, and sister lives away and also assists). Lives in: House/apartment--1 level  PLOF: Needs assistance with ADLs  PATIENT GOALS: improve ability to use R hand (dominant)  OBJECTIVE:  Note: Objective measures were completed at Evaluation unless otherwise noted.  HAND  DOMINANCE: Right  ADLs: Overall ADLs: Weekly NA meeting (33 years clean) that enjoys, goes to church, shops and gets hair/nails done. Transfers/ambulation related to ADLs: Eating: Pt reports using L nondominant hand (last 3 months), impulsivity  Grooming: able to clean teeth/dentures UB Dressing: mod I LB Dressing: mod I Toileting: mod I Bathing: showers 1x/wk with assistance, wash-ups mod I Tub Shower transfers: min A Equipment: Grab bars and Walk in shower  IADLs: Shopping: with family Light housekeeping: family performs Meal Prep: family performs Community mobility: relies on family Medication management: family performs Handwriting: NT (affected with severe visual deficits)  MOBILITY STATUS: Hx of falls and ambulates with RW, vision affects and impulsivity.  Needs cueing for safe sit>stand due to impulsivity, leans back  POSTURE COMMENTS:   Pt tends to lean back due to visual deficits, rounded shoulders  FUNCTIONAL OUTCOME MEASURES:      COORDINATION: Box and Blocks:  Right 18 blocks, Left 23 blocks  UE ROM:  R shoulder 85* shoulder flex with mod compensation (IR, elbow flex), elbow ext -65*.  LUE ROM grossly WFL  UE MMT:   Not tested  SENSATION: Pt denies changes  MUSCLE TONE: RUE: mod-significant rigidity (particularly with elbow and shoulder limiting movement), LUE grossly WFL  COGNITION: Overall cognitive status: impulsive, slow processing/bradyphrenia  VISION:  Pt unable to track in any direction, compensates with minimal head movements, Pt with decr peripheral vision bilaterally (R worse than L).  R side object must approach midline before pt able to see it.    OBSERVATIONS: Dystonia, Hypokinesia, and impulsivity noted with movement                                                                                                                    TREATMENT DATE: 04/17/23:  OT eval completed.   Also see pt education for education provided.   PATIENT  EDUCATION: Education details: OT eval results and POC; recommended supervision for eating due to impulsivity and risk of aspiration and recommended that pt/caregiver discuss any changes with swallowing (coughing/choking with food/liquid) with MD as pt may benefit from swallow study/ST again if changes occur (last ST 08/2023); Also educated pt/sister in importance of fall prevention and that impulsivity is part of PSP is fall risk and that pt will need cueing/supervision for impulsivity. Person educated: Patient and sister Education method: Explanation Education comprehension: verbalized understanding  HOME EXERCISE PROGRAM: Not yet issued.  GOALS: Goals reviewed with patient? Yes  SHORT TERM GOALS: Target date: 05/19/23  Pt will perform HEP with set-up and min cueing (for improved RUE functional use and stretching to help with pain).   Goal status:  INITIAL  2.  Pt will demo at least 95* R shoulder flex with min compensation for functional reach. Baseline: 85* with mod compensation, IR Goal status: INITIAL  3.  Pt will demo at least 55* R elbow ext for functional reach. Baseline:  65* Goal status: INITIAL    LONG TERM GOALS: Target date: 06/16/23  Pt/caregiver will verbalize understanding of adaptive strategies for ADLs to increase safety and ease (particularly due to impulsivity and fall risk).  Goal status: INITIAL  2.  Pt/caregiver will verbalize understanding of updated visual compensation strategies  to incr safety/ease with ADLs. Goal status: INITIAL  3.  Pt will improve abilty to use RUE functionally as shown by improving PSFS to at least 5. Baseline: 0 Goal status: INITIAL  4.  Pt will improve coordination of dominant RUE as shown by improving score on box and blocks test by at least 4. Baseline: 18 blocks Goal status: INITIAL    ASSESSMENT:  CLINICAL IMPRESSION: Patient is a 74 y.o. female who was seen today for occupational therapy evaluation for  PSP/Parkinsonism.  Pt was last seen for OT 08/2023 and participated in OT screen 01/2024 where OT was recommended due to decline in function, particularly with RUE and vision.  Pt reports that she has started using nondominant LUE for eating/ADLs now due to difficulty/pain with RUE.  Pt would benefit from occupational therapy to address these deficits for improved RUE functional use, safety/ease with ADLs, and to decr risk of future complications.  PERFORMANCE DEFICITS: in functional skills including ADLs, IADLs, coordination, dexterity, tone, ROM, pain, Fine motor control, Gross motor control, mobility, balance, decreased knowledge of precautions, decreased knowledge of use of DME, vision, and UE functional use, cognitive skills including attention, safety awareness, and impulsivity, and psychosocial skills including environmental adaptation, habits, and routines and behaviors.   IMPAIRMENTS: are limiting patient from ADLs, IADLs, leisure, and social participation.   COMORBIDITIES:  may have co-morbidities  that affects occupational performance. Patient will benefit from skilled OT to address above impairments and improve overall function.  MODIFICATION OR ASSISTANCE TO COMPLETE EVALUATION: Min-Moderate modification of tasks or assist with assess necessary to complete an evaluation.  OT OCCUPATIONAL PROFILE AND HISTORY: Detailed assessment: Review of records and additional review of physical, cognitive, psychosocial history related to current functional performance.  CLINICAL DECISION MAKING: Moderate - several treatment options, min-mod task modification necessary  REHAB POTENTIAL: Fair due to severity of deficits and progressive nature   EVALUATION COMPLEXITY: Moderate    PLAN:  OT FREQUENCY: 2x/week  OT DURATION: 8 weeks +eval  PLANNED INTERVENTIONS: 97535 self care/ADL training, 02889 therapeutic exercise, 97530 therapeutic activity, 97112 neuromuscular re-education, 97140 manual  therapy, 97018 paraffin, 02960 fluidotherapy, 97010 moist heat, 97010 cryotherapy, 97760 Orthotic Initial, and 97763 Orthotic/Prosthetic subsequent  RECOMMENDED OTHER SERVICES: completed Pt 03/2024  CONSULTED AND AGREED WITH PLAN OF CARE: Patient and family member/caregiver  PLAN FOR NEXT SESSION: strategies for ADLs, RUE stretching functional use/coordination HEP    Casy Tavano, OTR/L 04/16/2024, 12:30 PM   "

## 2024-04-16 ENCOUNTER — Ambulatory Visit: Attending: Internal Medicine | Admitting: Occupational Therapy

## 2024-04-16 DIAGNOSIS — M6281 Muscle weakness (generalized): Secondary | ICD-10-CM | POA: Diagnosis present

## 2024-04-16 DIAGNOSIS — M25621 Stiffness of right elbow, not elsewhere classified: Secondary | ICD-10-CM | POA: Diagnosis present

## 2024-04-16 DIAGNOSIS — R41844 Frontal lobe and executive function deficit: Secondary | ICD-10-CM | POA: Diagnosis present

## 2024-04-16 DIAGNOSIS — R41840 Attention and concentration deficit: Secondary | ICD-10-CM | POA: Diagnosis present

## 2024-04-16 DIAGNOSIS — R41842 Visuospatial deficit: Secondary | ICD-10-CM | POA: Diagnosis present

## 2024-04-16 DIAGNOSIS — R2681 Unsteadiness on feet: Secondary | ICD-10-CM | POA: Insufficient documentation

## 2024-04-16 DIAGNOSIS — R29898 Other symptoms and signs involving the musculoskeletal system: Secondary | ICD-10-CM | POA: Insufficient documentation

## 2024-04-16 DIAGNOSIS — M25611 Stiffness of right shoulder, not elsewhere classified: Secondary | ICD-10-CM | POA: Insufficient documentation

## 2024-04-16 DIAGNOSIS — R278 Other lack of coordination: Secondary | ICD-10-CM | POA: Diagnosis present

## 2024-04-16 DIAGNOSIS — R29818 Other symptoms and signs involving the nervous system: Secondary | ICD-10-CM | POA: Insufficient documentation

## 2024-04-17 ENCOUNTER — Other Ambulatory Visit: Payer: Self-pay

## 2024-04-17 ENCOUNTER — Other Ambulatory Visit: Payer: Self-pay | Admitting: Internal Medicine

## 2024-04-17 ENCOUNTER — Other Ambulatory Visit (HOSPITAL_COMMUNITY): Payer: Self-pay

## 2024-04-17 ENCOUNTER — Other Ambulatory Visit: Payer: Self-pay | Admitting: Neurology

## 2024-04-17 MED FILL — Omeprazole Cap Delayed Release 40 MG: ORAL | 30 days supply | Qty: 30 | Fill #8 | Status: AC

## 2024-04-20 ENCOUNTER — Ambulatory Visit: Admitting: Occupational Therapy

## 2024-04-20 DIAGNOSIS — M25621 Stiffness of right elbow, not elsewhere classified: Secondary | ICD-10-CM

## 2024-04-20 DIAGNOSIS — R278 Other lack of coordination: Secondary | ICD-10-CM

## 2024-04-20 DIAGNOSIS — R29818 Other symptoms and signs involving the nervous system: Secondary | ICD-10-CM

## 2024-04-20 DIAGNOSIS — R29898 Other symptoms and signs involving the musculoskeletal system: Secondary | ICD-10-CM

## 2024-04-20 DIAGNOSIS — R41842 Visuospatial deficit: Secondary | ICD-10-CM

## 2024-04-20 DIAGNOSIS — M25611 Stiffness of right shoulder, not elsewhere classified: Secondary | ICD-10-CM

## 2024-04-20 DIAGNOSIS — R41844 Frontal lobe and executive function deficit: Secondary | ICD-10-CM

## 2024-04-20 DIAGNOSIS — R41840 Attention and concentration deficit: Secondary | ICD-10-CM

## 2024-04-20 DIAGNOSIS — M6281 Muscle weakness (generalized): Secondary | ICD-10-CM

## 2024-04-20 NOTE — Patient Instructions (Addendum)
 UPPER EXTREMITY EXERCISES Complete Twice Daily Folding laundry:  Bath towels, hand towels, washcloths, kitchen towels, dishcloths, socks, underwear, gowns Washing the table: While sitting, use left hand to reach to top left corner Move arm down to bottom left corner Slide across to bottom right corner Switching to right hand, slide forward as far as you can (tape can mark current limit of stretch) Slide across top to left side Then clean the middle of the table, you can place both hands on the towel together to move across the table Towel passes:  With towel in one hand, bring arms out to the sides Pass towel to other hand Bring both arms back out to sides Repeat 10 times Towel raises: Raise left arm straight up over head Bring left arm behind back Repeat 10 times Beauty pose: Bring left hand to back of head Repeat 10 times

## 2024-04-20 NOTE — Therapy (Signed)
 " OUTPATIENT OCCUPATIONAL THERAPY [facility] TREATMENT   Patient Name: April Larson MRN: [id number] DOB:13-Jun-1950, 74 y.o., female Today's Date: 04/20/2024  PCP: Catheryn Slocumb, MD REFERRING PROVIDER: Buck Saucer, MD  END OF SESSION:  OT End of Session - 04/20/24 1145     Visit Number 2    Number of Visits 17    Date for Recertification  06/15/24    Authorization Type [employer] Dual 2026 Covered 100% VL: [place] NO AUTH REQUIRED    Authorization - Number of Visits 10    Progress Note Due on Visit 10    OT Start Time 1152    OT Stop Time 1230    OT Time Calculation (min) 38 min    Activity Tolerance Patient tolerated treatment well    Behavior During Therapy WFL for tasks assessed/performed          Past Medical History:  Diagnosis Date   Diabetes mellitus without complication (HCC)    Hypertension    Parkinson disease (HCC)    Substance abuse (HCC)    Past Surgical History:  Procedure Laterality Date   EYE SURGERY     Patient Active Problem List   Diagnosis Date Noted   Right hand pain 03/05/2024   Decreased ROM of right shoulder 03/05/2024   Hepatic steatosis 12/04/2023   OSA on CPAP 05/17/2023   Anemia 04/03/2023   Supranuclear palsy (HCC) 04/03/2023   Atypical syncope 03/04/2023   Seizure disorder (HCC) 03/04/2023   Seizure-like activity (HCC) 03/02/2023   Thrombocytosis 12/06/2022   Gait abnormality 12/06/2022   Encounter for annual physical exam 11/24/2022   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   Decreased dorsalis pedis pulse 11/17/2020   Tachycardia 08/11/2020   Cough 08/11/2020   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   Pure hypercholesterolemia 01/23/2020   Vitamin D  deficiency 10/23/2019   Right hip pain 05/15/2018   Estrogen deficiency 05/15/2018   Class 1 obesity due to excess calories with serious comorbidity and body mass index (BMI) of 31.0 to 31.9 in adult 05/15/2018   Diabetes mellitus with stage 2 chronic kidney disease (HCC)  01/11/2018   Benign hypertension with chronic kidney disease, stage II 01/11/2018    ONSET DATE: 01/31/24  OT screen  REFERRING DIAG: R26.81 (ICD-10-CM) - Unsteadiness on feet R41.842 (ICD-10-CM) - Visuospatial deficit G23.1 (ICD-10-CM) - 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) R27.8 (ICD-10-CM) - Other lack of coordination G20.C (ICD-10-CM) - Parkinsonism, unspecified  THERAPY DIAG:  Other lack of coordination  Attention and concentration deficit  Frontal lobe and executive function deficit  Visuospatial deficit  Other symptoms and signs involving the musculoskeletal system  Other symptoms and signs involving the nervous system  Stiffness of right shoulder, not elsewhere classified  Stiffness of right elbow, not elsewhere classified  Muscle weakness (generalized)  Rationale for Evaluation and Treatment: Rehabilitation  SUBJECTIVE:   SUBJECTIVE STATEMENT: [name] reports the pt has told her she is not able to fold laundry as she was able to do today in therapy.   Pt accompanied by: sister--[name]  PERTINENT HISTORY:  Pt returns after follow-up OT screen 01/31/24 which recommended OT due to decline in functional measures and coordination.    PMH:  PSP, OSA on CPAP, HTN, DM, hx of seizure/seizure-like activity, hx of substance abuse.   PRECAUTIONS: Fall  WEIGHT BEARING RESTRICTIONS: No  PAIN:  Are you having pain? Yes: NPRS scale: 5-6/10 Pain location: R hand/forearm Pain description: constant Aggravating factors: nothing Relieving factors: icy hot helps a little  FALLS: Has patient fallen  in last 6 months? Yes. Number of falls 4, 1 since PT d/c--falls backwards (when she hasn't used walker)  LIVING ENVIRONMENT  Lives with: lives with their family (brother, and sister lives away and also assists). Lives in: House/apartment--1 level  PLOF: Needs assistance with ADLs  PATIENT GOALS: improve ability to use R hand  (dominant)  OBJECTIVE:  Note: Objective measures were completed at Evaluation unless otherwise noted.  HAND DOMINANCE: Right  ADLs: Overall ADLs: Weekly NA meeting (33 years clean) that enjoys, goes to church, shops and gets hair/nails done. Transfers/ambulation related to ADLs: Eating: Pt reports using L nondominant hand (last 3 months), impulsivity  Grooming: able to clean teeth/dentures UB Dressing: mod I LB Dressing: mod I Toileting: mod I Bathing: showers 1x/wk with assistance, wash-ups mod I Tub Shower transfers: min A Equipment: Grab bars and Walk in shower  IADLs: Shopping: with family Light housekeeping: family performs Meal Prep: family performs Community mobility: relies on family Medication management: family performs Handwriting: NT (affected with severe visual deficits)  MOBILITY STATUS: Hx of falls and ambulates with RW, vision affects and impulsivity.  Needs cueing for safe sit>stand due to impulsivity, leans back  POSTURE COMMENTS:   Pt tends to lean back due to visual deficits, rounded shoulders  FUNCTIONAL OUTCOME MEASURES:      COORDINATION: Box and Blocks:  Right 18 blocks, Left 23 blocks  UE ROM:  R shoulder 85* shoulder flex with mod compensation (IR, elbow flex), elbow ext -65*.  LUE ROM grossly WFL  UE MMT:   Not tested  SENSATION: Pt denies changes  MUSCLE TONE: RUE: mod-significant rigidity (particularly with elbow and shoulder limiting movement), LUE grossly WFL  COGNITION: Overall cognitive status: impulsive, slow processing/bradyphrenia  VISION:  Pt unable to track in any direction, compensates with minimal head movements, Pt with decr peripheral vision bilaterally (R worse than L).  R side object must approach midline before pt able to see it.    OBSERVATIONS: Dystonia, Hypokinesia, and impulsivity noted with movement                                                                                                                     TREATMENT:     OT educated pt and sister on functional UE exercises for fine motor coordination, gross motor coordination, upper extremity range of motion, pain reduction, processing, bimanual coordination/trunk control, sequencing of unfamiliar motor movements or tasks, motor planning, item/pattern recognition, and endurance/stamina. See pt education for specific education provided and tasks as completed this session. OT cued pt to use tactile senses to help with folding and determining where the edge of the table is located. Will plan to adjust handout as able during future visits.   PATIENT EDUCATION: Education details: UE Functional activities.  Person educated: Patient and sister Education method: Explanation, Demonstration, Tactile cues, Verbal cues, and Handouts Education comprehension: verbalized understanding, returned demonstration, verbal cues required, tactile cues required, and needs further education  HOME EXERCISE PROGRAM: 04/20/2024: UE Functional activity  exercises  GOALS: Goals reviewed with patient? Yes  SHORT TERM GOALS: Target date: 05/19/23  Pt will perform HEP with set-up and min cueing (for improved RUE functional use and stretching to help with pain).   Goal status: IN PROGRESS  2.  Pt will demo at least 95* R shoulder flex with min compensation for functional reach. Baseline: 85* with mod compensation, IR Goal status: IN PROGRESS  3.  Pt will demo at least 55* R elbow ext for functional reach. Baseline:  65* Goal status: IN PROGRESS    LONG TERM GOALS: Target date: 06/16/23  Pt/caregiver will verbalize understanding of adaptive strategies for ADLs to increase safety and ease (particularly due to impulsivity and fall risk).  Goal status: INITIAL  2.  Pt/caregiver will verbalize understanding of updated visual compensation strategies  to incr safety/ease with ADLs. Goal status: INITIAL  3.  Pt will improve abilty to use RUE functionally as shown by  improving PSFS to at least 5. Baseline: 0 Goal status: INITIAL  4.  Pt will improve coordination of dominant RUE as shown by improving score on box and blocks test by at least 4. Baseline: 18 blocks Goal status: INITIAL  ASSESSMENT:  CLINICAL IMPRESSION: Patient is a 74 y.o. female who was seen today for occupational therapy evaluation for PSP/Parkinsonism.  Pt was last seen for OT 08/2023 and participated in OT screen 01/2024 where OT was recommended due to decline in function, particularly with RUE and vision.  Initiation of functional HEP this session to promote UE remediation and increased participation in ADLs and IADLs. Will adjust handout as able.   PERFORMANCE DEFICITS: in functional skills including ADLs, IADLs, coordination, dexterity, tone, ROM, pain, Fine motor control, Gross motor control, mobility, balance, decreased knowledge of precautions, decreased knowledge of use of DME, vision, and UE functional use, cognitive skills including attention, safety awareness, and impulsivity, and psychosocial skills including environmental adaptation, habits, and routines and behaviors.   IMPAIRMENTS: are limiting patient from ADLs, IADLs, leisure, and social participation.   COMORBIDITIES:  may have co-morbidities  that affects occupational performance. Patient will benefit from skilled OT to address above impairments and improve overall function.  REHAB POTENTIAL: Fair due to severity of deficits and progressive nature   PLAN:  OT FREQUENCY: 2x/week  OT DURATION: 8 weeks +eval  PLANNED INTERVENTIONS: 97535 self care/ADL training, 02889 therapeutic exercise, 97530 therapeutic activity, 97112 neuromuscular re-education, 97140 manual therapy, 97018 paraffin, 02960 fluidotherapy, 97010 moist heat, 97010 cryotherapy, 97760 Orthotic Initial, and 97763 Orthotic/Prosthetic subsequent  RECOMMENDED OTHER SERVICES: completed Pt 03/2024  CONSULTED AND AGREED WITH PLAN OF CARE: Patient and family  member/caregiver  PLAN FOR NEXT SESSION: review RUE stretching functional use/coordination HEP strategies for ADLs   Jocelyn CHRISTELLA Bottom, OTR/L 04/20/2024, 3:55 PM   "

## 2024-04-23 ENCOUNTER — Ambulatory Visit: Admitting: Occupational Therapy

## 2024-04-23 ENCOUNTER — Encounter: Payer: Self-pay | Admitting: Occupational Therapy

## 2024-04-23 DIAGNOSIS — R278 Other lack of coordination: Secondary | ICD-10-CM | POA: Diagnosis not present

## 2024-04-23 DIAGNOSIS — R41844 Frontal lobe and executive function deficit: Secondary | ICD-10-CM

## 2024-04-23 DIAGNOSIS — R29818 Other symptoms and signs involving the nervous system: Secondary | ICD-10-CM

## 2024-04-23 DIAGNOSIS — M25611 Stiffness of right shoulder, not elsewhere classified: Secondary | ICD-10-CM

## 2024-04-23 DIAGNOSIS — R41840 Attention and concentration deficit: Secondary | ICD-10-CM

## 2024-04-23 DIAGNOSIS — M25621 Stiffness of right elbow, not elsewhere classified: Secondary | ICD-10-CM

## 2024-04-23 DIAGNOSIS — R41842 Visuospatial deficit: Secondary | ICD-10-CM

## 2024-04-23 DIAGNOSIS — R29898 Other symptoms and signs involving the musculoskeletal system: Secondary | ICD-10-CM

## 2024-04-23 NOTE — Patient Instructions (Signed)
 Performing Daily Activities with Big Movements  Pick at least 2 activities a day and perform with BIG, DELIBERATE movements/effort.  Try different activities each day. This can make the activity easier and turn daily activities into exercise to prevent problems in the future!  If you are standing, make sure to keep feet apart and stand with good/big/posture. However, all below activities should be done SEATED!  Examples: Dressing  Pull-over shirt:  good posture, bring shirt to head, deliberately push arms into sleeves Jacket:  deliberately push arms into sleeves Underwear/Pants/Shoes/Socks:  SIT, lean forward, and push each foot in deliberately 1 at a time Open hands to pull down shirt/put on socks/pull up pants--get more material in your hand   Bathing - Wash/dry with long strokes  Brushing your teeth - Big, slow movements, ELECTRIC TOOTHBRUSH  Eating - Hold utensil in the middle (not the end), hold fork straight up and down to stab food  Picking up a cup/bottle - Open hand up big and get object all the way in palm  Opening jar/bottle - Move as much as you can with each turn, twist wrist  Putting on seatbelt - Feet apart, twist when reaching across body, look at where you are reaching  Hanging up clothes/getting clothes down from closet - Reach with big effort, open hand, straighten elbow  Wiping counter/table - Move in big, long strokes, open hand  Folding clothes - Exaggerate arm movements  Walking into a store/restaurant - Walk with big steps, good posture, swing arms if able  Standing up from a chair/recliner/sofa - Scoot forward, lean forward, and stand carefully keeping head down

## 2024-04-23 NOTE — Therapy (Signed)
 " OUTPATIENT OCCUPATIONAL THERAPY [facility]   Patient Name: April Larson MRN: [id number] DOB:28-Jan-1951, 74 y.o., female Today's Date: 04/23/2024  PCP: Catheryn Slocumb, MD REFERRING PROVIDER: Buck Saucer, MD  END OF SESSION:  OT End of Session - 04/23/24 0809     Visit Number 3    Number of Visits 17    Date for Recertification  06/15/24    Authorization Type [employer] Dual 2026 Covered 100% VL: [place] NO AUTH REQUIRED    Authorization - Visit Number 3    Authorization - Number of Visits 10    Progress Note Due on Visit 10    OT Start Time 0805    OT Stop Time 0845    OT Time Calculation (min) 40 min    Activity Tolerance Patient tolerated treatment well    Behavior During Therapy WFL for tasks assessed/performed          Past Medical History:  Diagnosis Date   Diabetes mellitus without complication (HCC)    Hypertension    Parkinson disease (HCC)    Substance abuse (HCC)    Past Surgical History:  Procedure Laterality Date   EYE SURGERY     Patient Active Problem List   Diagnosis Date Noted   Right hand pain 03/05/2024   Decreased ROM of right shoulder 03/05/2024   Hepatic steatosis 12/04/2023   OSA on CPAP 05/17/2023   Anemia 04/03/2023   Supranuclear palsy (HCC) 04/03/2023   Atypical syncope 03/04/2023   Seizure disorder (HCC) 03/04/2023   Seizure-like activity (HCC) 03/02/2023   Thrombocytosis 12/06/2022   Gait abnormality 12/06/2022   Encounter for annual physical exam 11/24/2022   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   Decreased dorsalis pedis pulse 11/17/2020   Tachycardia 08/11/2020   Cough 08/11/2020   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   Pure hypercholesterolemia 01/23/2020   Vitamin D  deficiency 10/23/2019   Right hip pain 05/15/2018   Estrogen deficiency 05/15/2018   Class 1 obesity due to excess calories with serious comorbidity and body mass index (BMI) of 31.0 to 31.9 in adult 05/15/2018   Diabetes mellitus with stage  2 chronic kidney disease (HCC) 01/11/2018   Benign hypertension with chronic kidney disease, stage II 01/11/2018    ONSET DATE: 01/31/24  OT screen  REFERRING DIAG: R26.81 (ICD-10-CM) - Unsteadiness on feet R41.842 (ICD-10-CM) - Visuospatial deficit G23.1 (ICD-10-CM) - 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 No pain reported, but then after sister prompts, reported Rt shoulder and arm hurts  Pt accompanied by: sister--April Larson  PERTINENT HISTORY:  Pt returns after follow-up OT screen 01/31/24 which recommended OT due to decline in functional measures and coordination.    PMH:  PSP, OSA on CPAP, HTN, DM, hx of seizure/seizure-like activity, hx of substance abuse.   PRECAUTIONS: Fall  WEIGHT BEARING RESTRICTIONS: No  PAIN:  Are you having pain? Yes: NPRS scale: 5-6/10 Pain location: R hand/forearm Pain description: constant Aggravating factors: nothing Relieving factors: icy hot helps a little  FALLS: Has patient fallen  in last 6 months? Yes. Number of falls 4, 1 since PT d/c--falls backwards (when she hasn't used walker)  LIVING ENVIRONMENT  Lives with: lives with their family (brother, and sister lives away and also assists). Lives in: House/apartment--1 level  PLOF: Needs assistance with ADLs  PATIENT GOALS: improve ability to use R hand  (dominant)  OBJECTIVE:  Note: Objective measures were completed at Evaluation unless otherwise noted.  HAND DOMINANCE: Right  ADLs: Overall ADLs: Weekly NA meeting (33 years clean) that enjoys, goes to church, shops and gets hair/nails done. Transfers/ambulation related to ADLs: Eating: Pt reports using L nondominant hand (last 3 months), impulsivity  Grooming: able to clean teeth/dentures UB Dressing: mod I LB Dressing: mod I Toileting: mod I Bathing: showers 1x/wk with assistance, wash-ups mod I Tub Shower transfers: min A Equipment: Grab bars and Walk in shower  IADLs: Shopping: with family Light housekeeping: family performs Meal Prep: family performs Community mobility: relies on family Medication management: family performs Handwriting: NT (affected with severe visual deficits)  MOBILITY STATUS: Hx of falls and ambulates with RW, vision affects and impulsivity.  Needs cueing for safe sit>stand due to impulsivity, leans back  POSTURE COMMENTS:   Pt tends to lean back due to visual deficits, rounded shoulders  FUNCTIONAL OUTCOME MEASURES:      COORDINATION: Box and Blocks:  Right 18 blocks, Left 23 blocks  UE ROM:  R shoulder 85* shoulder flex with mod compensation (IR, elbow flex), elbow ext -65*.  LUE ROM grossly WFL  UE MMT:   Not tested  SENSATION: Pt denies changes  MUSCLE TONE: RUE: mod-significant rigidity (particularly with elbow and shoulder limiting movement), LUE grossly WFL  COGNITION: Overall cognitive status: impulsive, slow processing/bradyphrenia  VISION:  Pt unable to track in any direction, compensates with minimal head movements, Pt with decr peripheral vision bilaterally (R worse than L).  R side object must approach midline before pt able to see it.    OBSERVATIONS: Dystonia, Hypokinesia, and impulsivity noted with movement                                                                                                                     TREATMENT:     Reviewed functional UE exercises for upper extremity range of motion, pain reduction, processing, bimanual coordination/trunk control, sequencing of unfamiliar motor movements or tasks, motor planning, and endurance/stamina. Pt demo each with min to mod cueing  Pt/sister also issued ADL strategies with modifications prn given pt's fall risk and decreased cognition. Stressed importance of sitting to don pants over feet. Also stressed importance of pt participation in seated ADLS. Discussed safety with sit to stand transfers keeping head more down for visual compensations and to prevent retropulsion as pt falls backwards.   Began coordination activities including: flipping large cards over w/ cues for big movement and calling out card to promote bringing head down for visual compensations and visual scanning, and picking up pennies with compensations required sliding pennies to edge of  table d/t decreased coordination and having nails done - to issue as formal HEP next session  PATIENT EDUCATION: Education details: see above Person educated: Patient and sister Education method: Explanation, Demonstration, Tactile cues, Verbal cues, and Handouts Education comprehension: verbalized understanding, returned demonstration, verbal cues required, tactile cues required, and needs further education  HOME EXERCISE PROGRAM: 04/20/2024: UE Functional activity exercises  GOALS: Goals reviewed with patient? Yes  SHORT TERM GOALS: Target date: 05/19/23  Pt will perform HEP with set-up and min cueing (for improved RUE functional use and stretching to help with pain).   Goal status: IN PROGRESS  2.  Pt will demo at least 95* R shoulder flex with min compensation for functional reach. Baseline: 85* with mod compensation, IR Goal status: IN PROGRESS  3.  Pt will demo at least 55* R elbow ext for functional reach. Baseline:  65* Goal status: IN PROGRESS    LONG TERM GOALS: Target date:  06/16/23  Pt/caregiver will verbalize understanding of adaptive strategies for ADLs to increase safety and ease (particularly due to impulsivity and fall risk).  Goal status: INITIAL  2.  Pt/caregiver will verbalize understanding of updated visual compensation strategies  to incr safety/ease with ADLs. Goal status: INITIAL  3.  Pt will improve abilty to use RUE functionally as shown by improving PSFS to at least 5. Baseline: 0 Goal status: INITIAL  4.  Pt will improve coordination of dominant RUE as shown by improving score on box and blocks test by at least 4. Baseline: 18 blocks Goal status: INITIAL  ASSESSMENT:  CLINICAL IMPRESSION: Patient is a 74 y.o. female who was seen today for occupational therapy evaluation for PSP/Parkinsonism.  Pt was last seen for OT 08/2023 and participated in OT screen 01/2024 where OT was recommended due to decline in function, particularly with RUE and vision.  Review of functional HEP this session to promote UE remediation and increased participation in ADLs and IADLs. Also issued modified ADL strategies this session  PERFORMANCE DEFICITS: in functional skills including ADLs, IADLs, coordination, dexterity, tone, ROM, pain, Fine motor control, Gross motor control, mobility, balance, decreased knowledge of precautions, decreased knowledge of use of DME, vision, and UE functional use, cognitive skills including attention, safety awareness, and impulsivity, and psychosocial skills including environmental adaptation, habits, and routines and behaviors.   IMPAIRMENTS: are limiting patient from ADLs, IADLs, leisure, and social participation.   COMORBIDITIES:  may have co-morbidities  that affects occupational performance. Patient will benefit from skilled OT to address above impairments and improve overall function.  REHAB POTENTIAL: Fair due to severity of deficits and progressive nature   PLAN:  OT FREQUENCY: 2x/week  OT DURATION: 8 weeks +eval  PLANNED  INTERVENTIONS: 97535 self care/ADL training, 02889 therapeutic exercise, 97530 therapeutic activity, 97112 neuromuscular re-education, 97140 manual therapy, 97018 paraffin, 02960 fluidotherapy, 97010 moist heat, 97010 cryotherapy, 97760 Orthotic Initial, and 97763 Orthotic/Prosthetic subsequent  RECOMMENDED OTHER SERVICES: completed Pt 03/2024  CONSULTED AND AGREED WITH PLAN OF CARE: Patient and family member/caregiver  PLAN FOR NEXT SESSION: simple coordination HEP (see above tx for what was started) Review strategies for ADLs   Burnard JINNY Roads, OTR/L 04/23/2024, 8:09 AM   "

## 2024-04-27 ENCOUNTER — Ambulatory Visit: Admitting: Occupational Therapy

## 2024-04-27 LAB — HM DEXA SCAN

## 2024-04-30 ENCOUNTER — Ambulatory Visit: Admitting: Occupational Therapy

## 2024-05-02 ENCOUNTER — Encounter: Payer: Self-pay | Admitting: Internal Medicine

## 2024-05-04 ENCOUNTER — Ambulatory Visit: Admitting: Occupational Therapy

## 2024-05-04 DIAGNOSIS — R41840 Attention and concentration deficit: Secondary | ICD-10-CM

## 2024-05-04 DIAGNOSIS — R41842 Visuospatial deficit: Secondary | ICD-10-CM

## 2024-05-04 DIAGNOSIS — R278 Other lack of coordination: Secondary | ICD-10-CM

## 2024-05-04 DIAGNOSIS — M25621 Stiffness of right elbow, not elsewhere classified: Secondary | ICD-10-CM

## 2024-05-04 DIAGNOSIS — M25611 Stiffness of right shoulder, not elsewhere classified: Secondary | ICD-10-CM

## 2024-05-04 DIAGNOSIS — R29898 Other symptoms and signs involving the musculoskeletal system: Secondary | ICD-10-CM

## 2024-05-04 DIAGNOSIS — R41844 Frontal lobe and executive function deficit: Secondary | ICD-10-CM

## 2024-05-04 DIAGNOSIS — M6281 Muscle weakness (generalized): Secondary | ICD-10-CM

## 2024-05-04 DIAGNOSIS — R29818 Other symptoms and signs involving the nervous system: Secondary | ICD-10-CM

## 2024-05-04 NOTE — Therapy (Signed)
 " OUTPATIENT OCCUPATIONAL THERAPY [facility]   Patient Name: April Larson MRN: [id number] DOB:07/19/1950, 74 y.o., female Today's Date: 05/04/2024  PCP: Catheryn Slocumb, MD REFERRING PROVIDER: Buck Saucer, MD  END OF SESSION:  OT End of Session - 05/04/24 1149     Visit Number 4    Number of Visits 17    Date for Recertification  06/15/24    Authorization Type [employer] Dual 2026 Covered 100% VL: [place] NO AUTH REQUIRED    Authorization - Number of Visits 10    Progress Note Due on Visit 10    OT Start Time 1150    OT Stop Time 1232    OT Time Calculation (min) 42 min    Activity Tolerance Patient tolerated treatment well    Behavior During Therapy WFL for tasks assessed/performed          Past Medical History:  Diagnosis Date   Diabetes mellitus without complication (HCC)    Hypertension    Parkinson disease (HCC)    Substance abuse (HCC)    Past Surgical History:  Procedure Laterality Date   EYE SURGERY     Patient Active Problem List   Diagnosis Date Noted   Right hand pain 03/05/2024   Decreased ROM of right shoulder 03/05/2024   Hepatic steatosis 12/04/2023   OSA on CPAP 05/17/2023   Anemia 04/03/2023   Supranuclear palsy (HCC) 04/03/2023   Atypical syncope 03/04/2023   Seizure disorder (HCC) 03/04/2023   Seizure-like activity (HCC) 03/02/2023   Thrombocytosis 12/06/2022   Gait abnormality 12/06/2022   Encounter for annual physical exam 11/24/2022   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   Decreased dorsalis pedis pulse 11/17/2020   Tachycardia 08/11/2020   Cough 08/11/2020   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   Pure hypercholesterolemia 01/23/2020   Vitamin D  deficiency 10/23/2019   Right hip pain 05/15/2018   Estrogen deficiency 05/15/2018   Class 1 obesity due to excess calories with serious comorbidity and body mass index (BMI) of 31.0 to 31.9 in adult 05/15/2018   Diabetes mellitus with stage 2 chronic kidney disease (HCC)  01/11/2018   Benign hypertension with chronic kidney disease, stage II 01/11/2018    ONSET DATE: 01/31/24  OT screen  REFERRING DIAG: R26.81 (ICD-10-CM) - Unsteadiness on feet R41.842 (ICD-10-CM) - Visuospatial deficit G23.1 (ICD-10-CM) - 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) R27.8 (ICD-10-CM) - Other lack of coordination G20.C (ICD-10-CM) - Parkinsonism, unspecified  THERAPY DIAG:  Other lack of coordination  Attention and concentration deficit  Frontal lobe and executive function deficit  Visuospatial deficit  Other symptoms and signs involving the musculoskeletal system  Other symptoms and signs involving the nervous system  Stiffness of right shoulder, not elsewhere classified  Stiffness of right elbow, not elsewhere classified  Muscle weakness (generalized)  Rationale for Evaluation and Treatment: Rehabilitation  SUBJECTIVE:   SUBJECTIVE STATEMENT: She has been folding laundry at home.   Pt accompanied by: sister--[name]  PERTINENT HISTORY:  Pt returns after follow-up OT screen 01/31/24 which recommended OT due to decline in functional measures and coordination.    PMH:  PSP, OSA on CPAP, HTN, DM, hx of seizure/seizure-like activity, hx of substance abuse.   PRECAUTIONS: Fall  WEIGHT BEARING RESTRICTIONS: No  PAIN:  Are you having pain? Yes: NPRS scale: 5-6/10 Pain location: R hand/forearm Pain description: constant Aggravating factors: nothing Relieving factors: icy hot helps a little  FALLS: Has patient fallen in last 6 months? Yes. Number of falls 4, 1 since PT d/c--falls backwards (when she  hasn't used walker)  LIVING ENVIRONMENT  Lives with: lives with their family (brother, and sister lives away and also assists). Lives in: House/apartment--1 level  PLOF: Needs assistance with ADLs  PATIENT GOALS: improve ability to use R hand (dominant)  OBJECTIVE:  Note: Objective measures were completed at Evaluation  unless otherwise noted.  HAND DOMINANCE: Right  ADLs: Overall ADLs: Weekly NA meeting (33 years clean) that enjoys, goes to church, shops and gets hair/nails done. Transfers/ambulation related to ADLs: Eating: Pt reports using L nondominant hand (last 3 months), impulsivity  Grooming: able to clean teeth/dentures UB Dressing: mod I LB Dressing: mod I Toileting: mod I Bathing: showers 1x/wk with assistance, wash-ups mod I Tub Shower transfers: min A Equipment: Grab bars and Walk in shower  IADLs: Shopping: with family Light housekeeping: family performs Meal Prep: family performs Community mobility: relies on family Medication management: family performs Handwriting: NT (affected with severe visual deficits)  MOBILITY STATUS: Hx of falls and ambulates with RW, vision affects and impulsivity.  Needs cueing for safe sit>stand due to impulsivity, leans back  POSTURE COMMENTS:   Pt tends to lean back due to visual deficits, rounded shoulders  FUNCTIONAL OUTCOME MEASURES:      COORDINATION: Box and Blocks:  Right 18 blocks, Left 23 blocks  UE ROM:  R shoulder 85* shoulder flex with mod compensation (IR, elbow flex), elbow ext -65*.  LUE ROM grossly WFL  UE MMT:   Not tested  SENSATION: Pt denies changes  MUSCLE TONE: RUE: mod-significant rigidity (particularly with elbow and shoulder limiting movement), LUE grossly WFL  COGNITION: Overall cognitive status: impulsive, slow processing/bradyphrenia  VISION:  Pt unable to track in any direction, compensates with minimal head movements, Pt with decr peripheral vision bilaterally (R worse than L).  R side object must approach midline before pt able to see it.    OBSERVATIONS: Dystonia, Hypokinesia, and impulsivity noted with movement                                                                                                                    TREATMENT:     Updated and issued BUE coordination HEP as noted in pt  instructions. Pt requiring cues to reduce freezing episodes with RUE use. Mod multimodal cueing for completion.   PATIENT EDUCATION: Education details: see above Person educated: Patient and sister Education method: Explanation, Demonstration, Tactile cues, Verbal cues, and Handouts Education comprehension: verbalized understanding, returned demonstration, verbal cues required, tactile cues required, and needs further education  HOME EXERCISE PROGRAM: 04/20/2024: UE Functional activity exercises 05/04/2024: coordination HEP  GOALS: Goals reviewed with patient? Yes  SHORT TERM GOALS: Target date: 05/19/23  Pt will perform HEP with set-up and min cueing (for improved RUE functional use and stretching to help with pain).   Goal status: IN PROGRESS  2.  Pt will demo at least 95* R shoulder flex with min compensation for functional reach. Baseline: 85* with mod compensation, IR Goal status: IN PROGRESS  3.  Pt will demo at least 55* R elbow ext for functional reach. Baseline:  65* Goal status: IN PROGRESS   LONG TERM GOALS: Target date: 06/16/23  Pt/caregiver will verbalize understanding of adaptive strategies for ADLs to increase safety and ease (particularly due to impulsivity and fall risk).  Goal status: INITIAL  2.  Pt/caregiver will verbalize understanding of updated visual compensation strategies  to incr safety/ease with ADLs. Goal status: INITIAL  3.  Pt will improve abilty to use RUE functionally as shown by improving PSFS to at least 5. Baseline: 0 Goal status: INITIAL  4.  Pt will improve coordination of dominant RUE as shown by improving score on box and blocks test by at least 4. Baseline: 18 blocks Goal status: INITIAL  ASSESSMENT:  CLINICAL IMPRESSION: Patient is a 74 y.o. female who was seen today for occupational therapy evaluation for PSP/Parkinsonism.  Pt demonstrates episodes of freezing with RUE use though is willing to participate in therapy as able  requiring increased time and cueing.   PERFORMANCE DEFICITS: in functional skills including ADLs, IADLs, coordination, dexterity, tone, ROM, pain, Fine motor control, Gross motor control, mobility, balance, decreased knowledge of precautions, decreased knowledge of use of DME, vision, and UE functional use, cognitive skills including attention, safety awareness, and impulsivity, and psychosocial skills including environmental adaptation, habits, and routines and behaviors.   IMPAIRMENTS: are limiting patient from ADLs, IADLs, leisure, and social participation.   COMORBIDITIES:  may have co-morbidities  that affects occupational performance. Patient will benefit from skilled OT to address above impairments and improve overall function.  REHAB POTENTIAL: Fair due to severity of deficits and progressive nature   PLAN:  OT FREQUENCY: 2x/week  OT DURATION: 8 weeks +eval  PLANNED INTERVENTIONS: 97535 self care/ADL training, 02889 therapeutic exercise, 97530 therapeutic activity, 97112 neuromuscular re-education, 97140 manual therapy, 97018 paraffin, 02960 fluidotherapy, 97010 moist heat, 97010 cryotherapy, 97760 Orthotic Initial, and 97763 Orthotic/Prosthetic subsequent  RECOMMENDED OTHER SERVICES: completed Pt 03/2024  CONSULTED AND AGREED WITH PLAN OF CARE: Patient and family member/caregiver  PLAN FOR NEXT SESSION: review coordination HEP PRN(see pt instructions) Review strategies for ADLs   Jocelyn CHRISTELLA Bottom, OTR/L 05/04/2024, 3:29 PM   "

## 2024-05-04 NOTE — Patient Instructions (Addendum)
 Coordination Exercises  Perform the following exercises for 20 minutes 1 times per day. Perform with both hand(s). Perform using big movements.  Flipping Cards: Place deck of cards (JUMBO) on the table. Flip cards over by opening your hand big to grasp and then turn your palm up big, opening hand fully to release.  Place card on tabletop.  Then flick fingers (extend fingers) powerfully to slide card off table (can have chair/box below table to catch the cards). Count Down from 3-1 before flicking card on the Right side You can reset by sliding card forward and back to the edge of the table  Pick up coins or checkers and place in coin bank or container: Open hand big and pick up with big, intentional movements. Do not drag coin to the edge.

## 2024-05-07 ENCOUNTER — Ambulatory Visit: Admitting: Occupational Therapy

## 2024-05-11 ENCOUNTER — Ambulatory Visit: Payer: Self-pay | Admitting: Internal Medicine

## 2024-05-11 ENCOUNTER — Ambulatory Visit: Admitting: Occupational Therapy

## 2024-05-14 ENCOUNTER — Ambulatory Visit: Attending: Internal Medicine | Admitting: Occupational Therapy

## 2024-05-18 ENCOUNTER — Ambulatory Visit: Admitting: Occupational Therapy

## 2024-05-21 ENCOUNTER — Ambulatory Visit: Admitting: Occupational Therapy

## 2024-05-22 ENCOUNTER — Ambulatory Visit: Admitting: Neurology

## 2024-05-25 ENCOUNTER — Ambulatory Visit: Admitting: Occupational Therapy

## 2024-05-28 ENCOUNTER — Ambulatory Visit: Admitting: Occupational Therapy

## 2024-06-01 ENCOUNTER — Ambulatory Visit: Admitting: Occupational Therapy

## 2024-06-26 ENCOUNTER — Ambulatory Visit: Attending: Internal Medicine | Admitting: Occupational Therapy

## 2024-06-26 ENCOUNTER — Ambulatory Visit: Admitting: Physical Therapy

## 2024-07-09 ENCOUNTER — Ambulatory Visit: Admitting: Internal Medicine

## 2024-09-10 ENCOUNTER — Encounter: Admitting: Psychology

## 2024-09-26 ENCOUNTER — Ambulatory Visit: Payer: Self-pay

## 2024-12-03 ENCOUNTER — Encounter: Payer: Self-pay | Admitting: Internal Medicine
